# Patient Record
Sex: Female | Born: 1955 | Race: White | Hispanic: No | Smoking: Former smoker
Health system: Southern US, Community
[De-identification: ages and names within clinical notes are randomized; demographics above are authoritative.]

## PROBLEM LIST (undated history)

## (undated) DIAGNOSIS — F32A Depression, unspecified: Secondary | ICD-10-CM

## (undated) DIAGNOSIS — I639 Cerebral infarction, unspecified: Secondary | ICD-10-CM

## (undated) DIAGNOSIS — Z720 Tobacco use: Secondary | ICD-10-CM

## (undated) DIAGNOSIS — G2581 Restless legs syndrome: Secondary | ICD-10-CM

## (undated) DIAGNOSIS — C801 Malignant (primary) neoplasm, unspecified: Secondary | ICD-10-CM

## (undated) DIAGNOSIS — N6009 Solitary cyst of unspecified breast: Secondary | ICD-10-CM

## (undated) DIAGNOSIS — R569 Unspecified convulsions: Secondary | ICD-10-CM

## (undated) DIAGNOSIS — I209 Angina pectoris, unspecified: Secondary | ICD-10-CM

## (undated) DIAGNOSIS — I10 Essential (primary) hypertension: Secondary | ICD-10-CM

## (undated) DIAGNOSIS — G459 Transient cerebral ischemic attack, unspecified: Secondary | ICD-10-CM

## (undated) DIAGNOSIS — F329 Major depressive disorder, single episode, unspecified: Secondary | ICD-10-CM

## (undated) HISTORY — DX: Solitary cyst of unspecified breast: N60.09

## (undated) HISTORY — DX: Malignant (primary) neoplasm, unspecified: C80.1

## (undated) HISTORY — PX: KNEE ARTHROSCOPY: SUR90

## (undated) HISTORY — PX: MASTECTOMY: SHX3

## (undated) HISTORY — PX: CARPAL TUNNEL RELEASE: SHX101

## (undated) HISTORY — PX: BREAST RECONSTRUCTION: SHX9

---

## 1998-03-12 ENCOUNTER — Other Ambulatory Visit: Admission: RE | Admit: 1998-03-12 | Discharge: 1998-03-12 | Payer: Self-pay | Admitting: Obstetrics and Gynecology

## 1998-03-14 ENCOUNTER — Ambulatory Visit (HOSPITAL_COMMUNITY): Admission: RE | Admit: 1998-03-14 | Discharge: 1998-03-14 | Payer: Self-pay | Admitting: Obstetrics and Gynecology

## 1999-08-07 ENCOUNTER — Other Ambulatory Visit: Admission: RE | Admit: 1999-08-07 | Discharge: 1999-08-07 | Payer: Self-pay | Admitting: Obstetrics and Gynecology

## 2000-08-02 ENCOUNTER — Other Ambulatory Visit: Admission: RE | Admit: 2000-08-02 | Discharge: 2000-08-02 | Payer: Self-pay | Admitting: Obstetrics and Gynecology

## 2001-05-22 ENCOUNTER — Encounter: Admission: RE | Admit: 2001-05-22 | Discharge: 2001-05-22 | Payer: Self-pay | Admitting: Internal Medicine

## 2001-05-22 ENCOUNTER — Encounter: Payer: Self-pay | Admitting: Internal Medicine

## 2001-07-14 ENCOUNTER — Encounter
Admission: RE | Admit: 2001-07-14 | Discharge: 2001-07-14 | Payer: Self-pay | Admitting: Physical Medicine and Rehabilitation

## 2001-07-14 ENCOUNTER — Encounter: Payer: Self-pay | Admitting: Physical Medicine and Rehabilitation

## 2001-08-25 ENCOUNTER — Encounter: Payer: Self-pay | Admitting: Neurology

## 2001-08-25 ENCOUNTER — Encounter: Admission: RE | Admit: 2001-08-25 | Discharge: 2001-08-25 | Payer: Self-pay | Admitting: Neurology

## 2003-03-22 ENCOUNTER — Encounter: Admission: RE | Admit: 2003-03-22 | Discharge: 2003-03-22 | Payer: Self-pay | Admitting: Family Medicine

## 2003-08-09 ENCOUNTER — Encounter: Admission: RE | Admit: 2003-08-09 | Discharge: 2003-08-09 | Payer: Self-pay | Admitting: Family Medicine

## 2004-08-17 ENCOUNTER — Ambulatory Visit: Payer: Self-pay | Admitting: Internal Medicine

## 2004-09-03 ENCOUNTER — Ambulatory Visit: Payer: Self-pay | Admitting: Internal Medicine

## 2004-09-07 ENCOUNTER — Ambulatory Visit: Payer: Self-pay | Admitting: *Deleted

## 2004-09-17 ENCOUNTER — Ambulatory Visit: Payer: Self-pay | Admitting: Internal Medicine

## 2004-09-22 ENCOUNTER — Ambulatory Visit: Payer: Self-pay | Admitting: Internal Medicine

## 2004-09-29 ENCOUNTER — Ambulatory Visit (HOSPITAL_COMMUNITY): Admission: RE | Admit: 2004-09-29 | Discharge: 2004-09-29 | Payer: Self-pay | Admitting: Internal Medicine

## 2004-10-01 ENCOUNTER — Ambulatory Visit: Payer: Self-pay | Admitting: Nurse Practitioner

## 2004-10-21 ENCOUNTER — Ambulatory Visit (HOSPITAL_COMMUNITY): Admission: RE | Admit: 2004-10-21 | Discharge: 2004-10-21 | Payer: Self-pay | Admitting: Internal Medicine

## 2004-10-21 ENCOUNTER — Ambulatory Visit: Payer: Self-pay | Admitting: Family Medicine

## 2004-10-27 ENCOUNTER — Ambulatory Visit: Payer: Self-pay | Admitting: Internal Medicine

## 2004-12-16 ENCOUNTER — Ambulatory Visit: Payer: Self-pay | Admitting: Internal Medicine

## 2005-10-03 ENCOUNTER — Emergency Department (HOSPITAL_COMMUNITY): Admission: EM | Admit: 2005-10-03 | Discharge: 2005-10-03 | Payer: Self-pay | Admitting: Emergency Medicine

## 2008-04-17 ENCOUNTER — Encounter: Admission: RE | Admit: 2008-04-17 | Discharge: 2008-04-17 | Payer: Self-pay | Admitting: Family Medicine

## 2009-11-14 ENCOUNTER — Encounter: Admission: RE | Admit: 2009-11-14 | Discharge: 2009-11-14 | Payer: Self-pay | Admitting: Family Medicine

## 2010-02-22 ENCOUNTER — Encounter: Payer: Self-pay | Admitting: Internal Medicine

## 2010-08-02 DIAGNOSIS — G459 Transient cerebral ischemic attack, unspecified: Secondary | ICD-10-CM

## 2010-08-02 HISTORY — DX: Transient cerebral ischemic attack, unspecified: G45.9

## 2010-08-07 ENCOUNTER — Inpatient Hospital Stay (HOSPITAL_COMMUNITY)
Admission: EM | Admit: 2010-08-07 | Discharge: 2010-08-09 | DRG: 313 | Disposition: A | Discharge status: Home / Self Care | Payer: Managed Care, Other (non HMO) | Attending: Internal Medicine | Admitting: Internal Medicine

## 2010-08-07 ENCOUNTER — Encounter: Payer: Self-pay | Admitting: Internal Medicine

## 2010-08-07 ENCOUNTER — Emergency Department (HOSPITAL_COMMUNITY): Payer: Managed Care, Other (non HMO)

## 2010-08-07 ENCOUNTER — Inpatient Hospital Stay (HOSPITAL_COMMUNITY): Payer: Managed Care, Other (non HMO)

## 2010-08-07 DIAGNOSIS — F329 Major depressive disorder, single episode, unspecified: Secondary | ICD-10-CM | POA: Diagnosis present

## 2010-08-07 DIAGNOSIS — R51 Headache: Secondary | ICD-10-CM | POA: Diagnosis present

## 2010-08-07 DIAGNOSIS — R4789 Other speech disturbances: Secondary | ICD-10-CM | POA: Diagnosis present

## 2010-08-07 DIAGNOSIS — I498 Other specified cardiac arrhythmias: Secondary | ICD-10-CM | POA: Diagnosis present

## 2010-08-07 DIAGNOSIS — I209 Angina pectoris, unspecified: Secondary | ICD-10-CM | POA: Diagnosis present

## 2010-08-07 DIAGNOSIS — R29810 Facial weakness: Secondary | ICD-10-CM | POA: Diagnosis present

## 2010-08-07 DIAGNOSIS — F3289 Other specified depressive episodes: Secondary | ICD-10-CM | POA: Diagnosis present

## 2010-08-07 DIAGNOSIS — F17200 Nicotine dependence, unspecified, uncomplicated: Secondary | ICD-10-CM | POA: Diagnosis present

## 2010-08-07 DIAGNOSIS — R29898 Other symptoms and signs involving the musculoskeletal system: Secondary | ICD-10-CM | POA: Diagnosis present

## 2010-08-07 DIAGNOSIS — R072 Precordial pain: Principal | ICD-10-CM | POA: Diagnosis present

## 2010-08-07 DIAGNOSIS — G459 Transient cerebral ischemic attack, unspecified: Secondary | ICD-10-CM | POA: Diagnosis present

## 2010-08-07 DIAGNOSIS — R079 Chest pain, unspecified: Secondary | ICD-10-CM

## 2010-08-07 DIAGNOSIS — I10 Essential (primary) hypertension: Secondary | ICD-10-CM | POA: Diagnosis present

## 2010-08-07 LAB — BASIC METABOLIC PANEL
CO2: 26 mEq/L (ref 19–32)
Creatinine, Ser: 0.63 mg/dL (ref 0.50–1.10)
GFR calc Af Amer: >60 mL/min (ref >60)
Glucose, Bld: 96 mg/dL (ref 70–99)
Potassium: 4.4 mEq/L (ref 3.5–5.1)
Sodium: 137 mEq/L (ref 135–145)

## 2010-08-07 LAB — CBC
Hemoglobin: 12.3 g/dL (ref 12.0–15.0)
MCH: 35 pg — ABNORMAL HIGH (ref 26.0–34.0)
MCHC: 35 g/dL (ref 30.0–36.0)
MCV: 100 fL (ref 78.0–100.0)
Platelets: 211 10*3/uL (ref 150–400)
RBC: 3.51 MIL/uL — ABNORMAL LOW (ref 3.87–5.11)

## 2010-08-07 LAB — CK TOTAL AND CKMB (NOT AT ARMC)
Relative Index: INVALID (ref 0.0–2.5)
Total CK: 93 U/L (ref 7–177)

## 2010-08-07 LAB — CARDIAC PANEL(CRET KIN+CKTOT+MB+TROPI)
CK, MB: 2.1 ng/mL (ref 0.3–4.0)
Troponin I: <0.3 ng/mL (ref <0.30)

## 2010-08-07 LAB — DIFFERENTIAL
Basophils Absolute: 0 10*3/uL (ref 0.0–0.1)
Eosinophils Relative: 2 % (ref 0–5)
Lymphocytes Relative: 36 % (ref 12–46)

## 2010-08-07 NOTE — H&P (Signed)
Hospital Admission Note Date: 08/07/2010  Patient name: Anna Lyons Medical record number: [id number] Date of birth: September 11, 1955 Age: 55 y.o. Gender: female PCP: No primary provider on file.  Medical Service:  Attending physician:    Dr. Josem Kaufmann Resident [phone]):  Dr. Baltazar Apo    Pager: [phone] Resident (R1):  Dr. Milbert Coulter    Pager: [phone]  Chief Complaint:Chest pressure  History of Present Illness: Anna Lyons is a 55 year old woman who presents to the ED for chest pain. She provides most of the history and is accompanied by her husband and 2 friends. She reports the pain began this morning when she was moving boxes. Once the pain began, she sat down but the pain did not improve. Her husband then called EMS. . She reports her husband gave her aspirin when the pain began which did not alleviate her symptoms. She also says she received one dose of Nitroglycerin by EMS which fully relieved her pain.  She describes the pain as a burning pressure sensation. She is unable to recall if the pain radiated anywhere. In addition to her chest pain, she also experienced tingling and numbness in her right and left arm. She denies any loss of sensation or weakness. She also reports diaphoresis and nausea during the chest pain. She denies cough, vomiting, palpitations, shortness of breath, or edema. She denies pain with movement of her extremities or tenderness to palpation of the chest. She states that she has never experienced this type of pain before, but she did have one episode of angina which was diagnosed and treated by her PCP.  She is seeking further evaluation of her chest pain at the ED.   Meds: Sertraline 50 mg po qday  Allergies: Codeine- rash .  PMH: 1. Depression 2. Tension headaches   Surgical History: 1. Cesarean section  in 1975 2. Bilateral mastectomy in 1992  Family History: Mother: lupus Father: emphysema Siblings: 3 brothers and 1 sister with no known medical problems  Children:  1 daughter with no known medical problems     History   Social History  . Marital Status: Single    Spouse Name: N/A    Number of Children: N/A  . Years of Education: education: graduated high school    Occupational History  . -work: Conservation officer, nature at [employer]    Social History Main Topics  . Smoking status:  pack per day X 40 years; not interested in quitting   . Smokeless tobacco: Not on file  . Alcohol Use: Alcohol: 2 glasses of wine daily   . Drug Use: Not on file  . Sexually Active: Not on file    Review of Systems: Pertinent items are noted in HPI. General: diaphoretic while having chest pain; no fever, chills, weight loss, weight gain, fatigue, night sweats, pallor HEENT: headache after taking nitroglycerin; no vision changes, blurriness, dizziness, or tinnitus Cardiac: no dyspnea on exertion, orthopnea, palpitations, syncope, PND  Respiratory: when she lays down, occasional wheezing GI: nausea; heartburn only when eating spicy foods; no vomiting, changes in appetite, dysphagia, regular BMs; no hematochezia, hematemesis Extremities: no claudication, edema Urinary: no dysuria, frequency, urgency  MSK: no recent trauma, joint pain, joint swelling, muscle pain Neuro: no weakness, numbness, tremor Psych: depression; no anxiety   Vitals:  HR 70 SpO2 98 on RA  RR: 15 BP: 159/92 T: 97.8   Physical Exam:  General: 55 year old woman who is alert, cooperative, and not in acute distress Head: no trauma  Neck: no JVD, masses  Breasts: bilateral double mastectomy  CV: normal rate and rhythm. S1 and S2 normal. no murmurs, gallops or rubs  Resp: clear to auscultation bilaterally; no wheezes, crackles.  GI: soft, non-tender. Normal bowel sounds. No organomegaly or masses. Midline scar from cesarean section.   MSK: full range of motion. No weakness. Chest non-tender to palpation. Extremities: atraumatic, no cyanosis, no edema.   Pulses: 2+ and symmetric.  Neurologic: alert  and oriented X3, normal strength and tone.   Lab results:   WBC                                      5.1               4.0-10.5         K/uL  RBC                                      3.51       l      3.87-5.11        MIL/uL  Hemoglobin (HGB)                         12.3              12.0-15.0        g/dL  Hematocrit (HCT)                         35.1       l      36.0-46.0        %  MCV                                      100.0             78.0-100.0       fL  MCH -                                    35.0       h      26.0-34.0        pg  MCHC                                     35.0              30.0-36.0        g/dL  RDW                                      13.3              11.5-15.5        %  Platelet Count (PLT)                     211               150-400  K/uL  Neutrophils, %                           56                43-77            %  Lymphocytes, %                           36                12-46            %  Monocytes, %                             6                 3-12             %  Eosinophils, %                           2                 0-5              %  Basophils, %                             0                 0-1              %  Neutrophils, Absolute                    2.9               1.7-7.7          K/uL  Lymphocytes, Absolute                    1.8               0.7-4.0          K/uL  Monocytes, Absolute                      0.3               0.1-1.0          K/uL  Eosinophils, Absolute                    0.1               0.0-0.7          K/uL  Basophils, Absolute                      0.0               0.0-0.1          K/uL  Troponin I                               <0.30             <0.30  Ng/mL  Sodium (NA)                              137               135-145          mEq/L  Potassium (K)                            4.4               3.5-5.1          mEq/L  Chloride                                 101               96-112            mEq/L  CO2                                      26                19-32            mEq/L  Glucose                                  96                70-99            mg/dL  BUN                                      11                6-23             mg/dL  Creatinine                               0.63              0.50-1.10        mg/dL    **Please note change in reference range.**  GFR, Est Non African American            >60               >60              mL/min  GFR, Est African American                >60               >60              mL/min    Oversized comment, see footnote  1  Calcium                                  9.1               8.4-10.5  Mg/dL   Creatine Kinase, Total                   93                7-177            U/L  CK, MB                                   2.2               0.3-4.0          ng/mL  Relative Index                           SEE NOTE.         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  Otherwise   unchanged cardiopulmonary appearance with no acute or worrisome   focal abnormality identified.   Assessment & Plan by Problem:  Anna Lyons is 55 year old woman who presents to the ED for chest pain.  1. Chest pain- The most likely diagnosis is chest pain is unstable angina. This is most consistent with her acute presentation as well as her substernal chest pain, diaphoresis, and nausea. Furthermore, her symptoms were triggered by increased exertion and were relived with nitroglycerin. Also, she reports she experienced an episode of angina within the last year. Other diagnoses to consider include MI, GERD, aortic dissection, PE, and anxiety. The patient does not report any substernal pain radiating upwards or pain elicited by lying down to suggest GERD. The patient also denies anxiety. Based on the description of her pain, aortic dissection is less  likely. MI is also less likely based on the intensity and duration of the patient's symptoms.  Plan: -Cardiac enzymes x3  -chest X-ray  -12 lead EKG now and in the morning  -give Acetominophen 650mg  prn for pain   -morphine 2mg  IV every 4hours prn for pain   2. Depression- stable; Sertraline 50mg  po daily   3. Prophylaxis: DVT prophylaxis with Lovenox

## 2010-08-07 NOTE — H&P (Signed)
Hospital Admission Note Date: 08/07/2010  Patient name: Anna Lyons Medical record number: [id number] Date of birth: 1955/09/24 Age: 55 y.o. Gender: female PCP: No primary provider on file.  Medical Service:  Attending physician:    Dr. Josem Kaufmann Resident [phone]):  Dr. Baltazar Apo    Pager: [phone] Resident (R1):  Dr. Milbert Coulter    Pager: [phone]  Chief Complaint:Chest pressure  History of Present Illness: Anna Lyons is a 55 year old woman who presents to the ED for chest pain. She provides most of the history and is accompanied by her husband and 2 friends. She reports the pain began this morning when she was moving boxes. Once the pain began, she sat down but the pain did not improve. Her husband then called EMS. . She reports her husband gave her aspirin when the pain began which did not alleviate her symptoms. She also says she received one dose of Nitroglycerin by EMS which fully relieved her pain.  She describes the pain as a burning pressure sensation. She is unable to recall if the pain radiated anywhere. In addition to her chest pain, she also experienced tingling and numbness in her right and left arm. She denies any loss of sensation or weakness. She also reports diaphoresis and nausea during the chest pain. She denies cough, vomiting, palpitations, shortness of breath, or edema. She denies pain with movement of her extremities or tenderness to palpation of the chest. She states that she has never experienced this type of pain before, but she did have one episode of angina which was diagnosed and treated by her PCP.  She is seeking further evaluation of her chest pain at the ED.   Meds: Sertraline 50 mg po qday  Allergies: Codeine- rash .  PMH: 1. Depression 2. Tension headaches   Surgical History: 1. Cesarean section  in 1975 2. Bilateral mastectomy in 1992  Family History: Mother: lupus Father: emphysema Siblings: 3 brothers and 1 sister with no known medical problems  Children:  1 daughter with no known medical problems     History   Social History  . Marital Status: Single    Spouse Name: N/A    Number of Children: N/A  . Years of Education: education: graduated high school    Occupational History  . -work: Conservation officer, nature at [employer]    Social History Main Topics  . Smoking status:  pack per day X 40 years; not interested in quitting   . Smokeless tobacco: Not on file  . Alcohol Use: Alcohol: 2 glasses of wine daily   . Drug Use: Not on file  . Sexually Active: Not on file    Review of Systems: Pertinent items are noted in HPI. General: diaphoretic while having chest pain; no fever, chills, weight loss, weight gain, fatigue, night sweats, pallor HEENT: headache after taking nitroglycerin; no vision changes, blurriness, dizziness, or tinnitus Cardiac: no dyspnea on exertion, orthopnea, palpitations, syncope, PND  Respiratory: when she lays down, occasional wheezing GI: nausea; heartburn only when eating spicy foods; no vomiting, changes in appetite, dysphagia, regular BMs; no hematochezia, hematemesis Extremities: no claudication, edema Urinary: no dysuria, frequency, urgency  MSK: no recent trauma, joint pain, joint swelling, muscle pain Neuro: no weakness, numbness, tremor Psych: depression; no anxiety   Vitals:  HR 70 SpO2 98 on RA  RR: 15 BP: 159/92 T: 97.8   Physical Exam:  General: 56 year old woman who is alert, cooperative, and not in acute distress Head: no trauma  Neck: no JVD, masses  Breasts: bilateral double mastectomy  CV: normal rate and rhythm. S1 and S2 normal. no murmurs, gallops or rubs  Resp: clear to auscultation bilaterally; no wheezes, crackles.  GI: soft, non-tender. Normal bowel sounds. No organomegaly or masses. Midline scar from cesarean section.   MSK: full range of motion. No weakness. Chest non-tender to palpation. Extremities: atraumatic, no cyanosis, no edema.   Pulses: 2+ and symmetric.  Neurologic: alert  and oriented X3, normal strength and tone.   Lab results:   WBC                                      5.1               4.0-10.5         K/uL  RBC                                      3.51       l      3.87-5.11        MIL/uL  Hemoglobin (HGB)                         12.3              12.0-15.0        g/dL  Hematocrit (HCT)                         35.1       l      36.0-46.0        %  MCV                                      100.0             78.0-100.0       fL  MCH -                                    35.0       h      26.0-34.0        pg  MCHC                                     35.0              30.0-36.0        g/dL  RDW                                      13.3              11.5-15.5        %  Platelet Count (PLT)                     211               150-400  K/uL  Neutrophils, %                           56                43-77            %  Lymphocytes, %                           36                12-46            %  Monocytes, %                             6                 3-12             %  Eosinophils, %                           2                 0-5              %  Basophils, %                             0                 0-1              %  Neutrophils, Absolute                    2.9               1.7-7.7          K/uL  Lymphocytes, Absolute                    1.8               0.7-4.0          K/uL  Monocytes, Absolute                      0.3               0.1-1.0          K/uL  Eosinophils, Absolute                    0.1               0.0-0.7          K/uL  Basophils, Absolute                      0.0               0.0-0.1          K/uL  Troponin I                               <0.30             <0.30  Ng/mL   Sodium (NA)                              137               135-145          mEq/L  Potassium (K)                            4.4               3.5-5.1          mEq/L  Chloride                                 101               96-112            mEq/L  CO2                                      26                19-32            mEq/L  Glucose                                  96                70-99            mg/dL  BUN                                      11                6-23             mg/dL  Creatinine                               0.63              0.50-1.10        mg/dL    **Please note change in reference range.**  GFR, Est Non African American            >60               >60              mL/min  GFR, Est African American                >60               >60              mL/min    Oversized comment, see footnote  1  Calcium                                  9.1               8.4-10.5  Mg/dL   Creatine Kinase, Total                   93                7-177            U/L  CK, MB                                   2.2               0.3-4.0          ng/mL  Relative Index                           SEE NOTE.         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  Otherwise   unchanged cardiopulmonary appearance with no acute or worrisome   focal abnormality identified.   Assessment & Plan by Problem:  Ranelle Auker is 55 year old woman who presents to the ED for chest pain.  1. Chest pain- The most likely diagnosis is chest pain is unstable angina. This is most consistent with her acute presentation as well as her substernal chest pain, diaphoresis, and nausea. Furthermore, her symptoms were triggered by increased exertion and were relived with nitroglycerin. Also, she reports she experienced an episode of angina within the last year. Other diagnoses to consider include MI, GERD, aortic dissection, PE, and anxiety. The patient does not report any substernal pain radiating upwards or pain elicited by lying down to suggest GERD. The patient also denies anxiety. Based on the description of her pain, aortic dissection is less  likely. MI is also less likely based on the intensity and duration of the patient's symptoms.  Plan: -Cardiac enzymes x3  -Chest X-ray  -12 lead EKG now and in the morning  -give Acetominophen 650mg  prn for pain   -morphine 2mg  IV every 4hours prn for pain  -NTG as needed for chest pain  2. Depression- stable; Sertraline 50mg  po daily   3. Prophylaxis: DVT prophylaxis with Lovenox     [name] Jefferson PGY-1 [phone]    Melida Quitter PGY-3  [phone]

## 2010-08-08 ENCOUNTER — Inpatient Hospital Stay (HOSPITAL_COMMUNITY): Payer: Managed Care, Other (non HMO)

## 2010-08-08 LAB — LIPID PANEL: HDL: 36 mg/dL — ABNORMAL LOW (ref >39)

## 2010-08-08 LAB — PROTIME-INR: INR: 0.92 (ref 0.00–1.49)

## 2010-08-08 LAB — CARDIAC PANEL(CRET KIN+CKTOT+MB+TROPI): Relative Index: INVALID (ref 0.0–2.5)
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
## 2010-09-22 ENCOUNTER — Other Ambulatory Visit: Payer: Self-pay | Admitting: Family Medicine

## 2010-09-22 DIAGNOSIS — G459 Transient cerebral ischemic attack, unspecified: Secondary | ICD-10-CM

## 2010-09-25 ENCOUNTER — Ambulatory Visit
Admission: RE | Admit: 2010-09-25 | Discharge: 2010-09-25 | Disposition: A | Discharge status: Home / Self Care | Payer: Managed Care, Other (non HMO) | Source: Ambulatory Visit | Attending: Family Medicine | Admitting: Family Medicine

## 2010-09-25 DIAGNOSIS — G459 Transient cerebral ischemic attack, unspecified: Secondary | ICD-10-CM

## 2011-04-20 ENCOUNTER — Encounter (HOSPITAL_COMMUNITY): Payer: Self-pay | Admitting: Emergency Medicine

## 2011-04-20 ENCOUNTER — Inpatient Hospital Stay (HOSPITAL_COMMUNITY)
Admission: EM | Admit: 2011-04-20 | Discharge: 2011-04-23 | DRG: 062 | Disposition: A | Discharge status: Rehabilitation Facility | Payer: MEDICAID | Attending: Neurology | Admitting: Neurology

## 2011-04-20 ENCOUNTER — Emergency Department (HOSPITAL_COMMUNITY): Payer: Self-pay

## 2011-04-20 ENCOUNTER — Other Ambulatory Visit: Payer: Self-pay

## 2011-04-20 DIAGNOSIS — R42 Dizziness and giddiness: Secondary | ICD-10-CM | POA: Diagnosis not present

## 2011-04-20 DIAGNOSIS — K5900 Constipation, unspecified: Secondary | ICD-10-CM | POA: Diagnosis not present

## 2011-04-20 DIAGNOSIS — F331 Major depressive disorder, recurrent, moderate: Secondary | ICD-10-CM | POA: Insufficient documentation

## 2011-04-20 DIAGNOSIS — I6350 Cerebral infarction due to unspecified occlusion or stenosis of unspecified cerebral artery: Principal | ICD-10-CM | POA: Diagnosis present

## 2011-04-20 DIAGNOSIS — Z8673 Personal history of transient ischemic attack (TIA), and cerebral infarction without residual deficits: Secondary | ICD-10-CM

## 2011-04-20 DIAGNOSIS — I10 Essential (primary) hypertension: Secondary | ICD-10-CM | POA: Diagnosis present

## 2011-04-20 DIAGNOSIS — G8190 Hemiplegia, unspecified affecting unspecified side: Secondary | ICD-10-CM | POA: Diagnosis present

## 2011-04-20 DIAGNOSIS — R51 Headache: Secondary | ICD-10-CM | POA: Diagnosis present

## 2011-04-20 DIAGNOSIS — R4701 Aphasia: Secondary | ICD-10-CM | POA: Diagnosis present

## 2011-04-20 DIAGNOSIS — F17200 Nicotine dependence, unspecified, uncomplicated: Secondary | ICD-10-CM | POA: Diagnosis present

## 2011-04-20 DIAGNOSIS — F339 Major depressive disorder, recurrent, unspecified: Secondary | ICD-10-CM | POA: Insufficient documentation

## 2011-04-20 DIAGNOSIS — I639 Cerebral infarction, unspecified: Secondary | ICD-10-CM

## 2011-04-20 DIAGNOSIS — F3289 Other specified depressive episodes: Secondary | ICD-10-CM | POA: Diagnosis present

## 2011-04-20 DIAGNOSIS — F329 Major depressive disorder, single episode, unspecified: Secondary | ICD-10-CM | POA: Diagnosis present

## 2011-04-20 HISTORY — DX: Depression, unspecified: F32.A

## 2011-04-20 HISTORY — DX: Cerebral infarction, unspecified: I63.9

## 2011-04-20 HISTORY — DX: Major depressive disorder, single episode, unspecified: F32.9

## 2011-04-20 HISTORY — DX: Essential (primary) hypertension: I10

## 2011-04-20 LAB — COMPREHENSIVE METABOLIC PANEL
Albumin: 3.6 g/dL (ref 3.5–5.2)
BUN: 12 mg/dL (ref 6–23)
Calcium: 9.4 mg/dL (ref 8.4–10.5)
Creatinine, Ser: 0.66 mg/dL (ref 0.50–1.10)
Total Protein: 7.1 g/dL (ref 6.0–8.3)

## 2011-04-20 LAB — CK TOTAL AND CKMB (NOT AT ARMC)
CK, MB: 1.7 ng/mL (ref 0.3–4.0)
Total CK: 65 U/L (ref 7–177)

## 2011-04-20 LAB — DIFFERENTIAL
Basophils Relative: 1 % (ref 0–1)
Eosinophils Absolute: 0.1 10*3/uL (ref 0.0–0.7)
Eosinophils Relative: 1 % (ref 0–5)
Monocytes Absolute: 0.5 10*3/uL (ref 0.1–1.0)
Monocytes Relative: 9 % (ref 3–12)
Neutrophils Relative %: 61 % (ref 43–77)

## 2011-04-20 LAB — POCT I-STAT, CHEM 8
Calcium, Ion: 1.16 mmol/L (ref 1.12–1.32)
Hemoglobin: 14.3 g/dL (ref 12.0–15.0)
Sodium: 138 mEq/L (ref 135–145)
TCO2: 27 mmol/L (ref 0–100)

## 2011-04-20 LAB — CBC
HCT: 38.5 % (ref 36.0–46.0)
Hemoglobin: 13.8 g/dL (ref 12.0–15.0)
MCH: 35.3 pg — ABNORMAL HIGH (ref 26.0–34.0)
MCHC: 35.8 g/dL (ref 30.0–36.0)

## 2011-04-20 LAB — PROTIME-INR: INR: 1.04 (ref 0.00–1.49)

## 2011-04-20 LAB — TROPONIN I: Troponin I: <0.3 ng/mL (ref <0.30)

## 2011-04-20 MED ORDER — ACETAMINOPHEN 325 MG PO TABS
650.0000 mg | ORAL_TABLET | ORAL | Status: DC | PRN
  Administered 2011-04-21 (×2): 650 mg via ORAL
  Filled 2011-04-20 (×2): qty 2

## 2011-04-20 MED ORDER — ROPINIROLE HCL 0.5 MG PO TABS
0.5000 mg | ORAL_TABLET | Freq: Once | ORAL | Status: AC
  Administered 2011-04-21: 0.5 mg via ORAL
  Filled 2011-04-20: qty 1

## 2011-04-20 MED ORDER — SODIUM CHLORIDE 0.9 % IV SOLN
250.0000 mL | Freq: Once | INTRAVENOUS | Status: AC
  Administered 2011-04-20: 250 mL via INTRAVENOUS

## 2011-04-20 MED ORDER — ACETAMINOPHEN 650 MG RE SUPP: 650.0000 mg | RECTAL | Status: DC | PRN

## 2011-04-20 MED ORDER — PANTOPRAZOLE SODIUM 40 MG IV SOLR
40.0000 mg | Freq: Every day | INTRAVENOUS | Status: DC
  Administered 2011-04-20 – 2011-04-22 (×3): 40 mg via INTRAVENOUS
  Filled 2011-04-20 (×5): qty 40

## 2011-04-20 MED ORDER — SENNOSIDES-DOCUSATE SODIUM 8.6-50 MG PO TABS
1.0000 | ORAL_TABLET | Freq: Every evening | ORAL | Status: DC | PRN
  Filled 2011-04-20: qty 1

## 2011-04-20 MED ORDER — SODIUM CHLORIDE 0.9 % IV SOLN
INTRAVENOUS | Status: AC
  Administered 2011-04-21: 01:00:00 via INTRAVENOUS

## 2011-04-20 MED ORDER — ALTEPLASE (STROKE) FULL DOSE INFUSION: 0.9000 mg/kg | Freq: Once | INTRAVENOUS | Status: DC

## 2011-04-20 MED ORDER — IOHEXOL 300 MG/ML  SOLN
150.0000 mL | Freq: Once | INTRAMUSCULAR | Status: AC | PRN
  Administered 2011-04-20: 50 mL via INTRA_ARTERIAL

## 2011-04-20 MED ORDER — SODIUM CHLORIDE 0.9 % IV SOLN
INTRAVENOUS | Status: DC
  Administered 2011-04-21 (×2): 75 mL/h via INTRAVENOUS
  Administered 2011-04-22: 06:00:00 via INTRAVENOUS

## 2011-04-20 MED ORDER — ALTEPLASE (STROKE) FULL DOSE INFUSION
0.9000 mg/kg | Freq: Once | INTRAVENOUS | Status: AC
  Administered 2011-04-20: 64 mg via INTRAVENOUS
  Filled 2011-04-20: qty 100

## 2011-04-20 MED ORDER — LABETALOL HCL 5 MG/ML IV SOLN: 10.0000 mg | INTRAVENOUS | Status: DC | PRN

## 2011-04-20 MED ORDER — ONDANSETRON HCL 4 MG/2ML IJ SOLN: 4.0000 mg | Freq: Four times a day (QID) | INTRAMUSCULAR | Status: DC | PRN

## 2011-04-20 NOTE — ED Notes (Signed)
Pt returned to room from intervention.  Pt awaiting Room assignment at this time.

## 2011-04-20 NOTE — ED Provider Notes (Signed)
History     CSN: [id number]  Arrival date & time 04/20/11  1406   First MD Initiated Contact with Patient 04/20/11 1407      No chief complaint on file.   (Consider location/radiation/quality/duration/timing/severity/associated sxs/prior treatment) Patient is a 56 y.o. female presenting with neurologic complaint. The history is provided by the patient and the EMS personnel.  Neurologic Problem The primary symptoms include focal weakness. Primary symptoms do not include headaches, loss of consciousness, fever, nausea or vomiting. The symptoms began 1 to 2 hours ago. The symptoms are unchanged. The neurological symptoms are focal.  There is weakness in these regions/motions: facial muscles (right arm and right leg weakness).  Additional symptoms include weakness.    No past medical history on file.  No past surgical history on file.  No family history on file.  History  Substance Use Topics  . Smoking status: Not on file  . Smokeless tobacco: Not on file  . Alcohol Use: Not on file    OB History    No data available      Review of Systems  Constitutional: Negative for fever and chills.  HENT: Negative for congestion and rhinorrhea.   Respiratory: Negative for shortness of breath.   Cardiovascular: Negative for chest pain and leg swelling.  Gastrointestinal: Negative for nausea, vomiting, abdominal pain and constipation.  Genitourinary: Negative for urgency, decreased urine volume and difficulty urinating.  Skin: Negative for wound.  Neurological: Positive for focal weakness, speech difficulty and weakness. Negative for loss of consciousness and headaches.  Psychiatric/Behavioral: Negative for confusion.  All other systems reviewed and are negative.    Allergies  Review of patient's allergies indicates not on file.  Home Medications  No current outpatient prescriptions on file.  There were no vitals taken for this visit.  Physical Exam  Nursing note and  vitals reviewed. Constitutional: She is oriented to person, place, and time. She appears well-developed and well-nourished. No distress.  HENT:  Head: Normocephalic and atraumatic.  Right Ear: External ear normal.  Left Ear: External ear normal.  Nose: Nose normal.  Mouth/Throat: Oropharynx is clear and moist.  Neck: Neck supple.  Cardiovascular: Normal rate, regular rhythm, normal heart sounds and intact distal pulses.   Pulmonary/Chest: Effort normal and breath sounds normal. No respiratory distress. She has no wheezes. She has no rales.  Abdominal: Soft. She exhibits no distension. There is no tenderness.  Musculoskeletal: She exhibits no edema.  Lymphadenopathy:    She has no cervical adenopathy.  Neurological: She is alert and oriented to person, place, and time. No sensory deficit. She exhibits normal muscle tone. GCS eye subscore is 4. GCS verbal subscore is 5. GCS motor subscore is 6.       Pronounced weakness of right upper and lower extremities, including grip strength, biceps and triceps, and quadriceps and lower leg movement. Mild slurring of speech  Skin: Skin is warm and dry. She is not diaphoretic. No pallor.    ED Course  Procedures (including critical care time)  Labs Reviewed  CBC - Abnormal; Notable for the following:    MCH 35.3 (*)    All other components within normal limits  PROTIME-INR  APTT  DIFFERENTIAL  COMPREHENSIVE METABOLIC PANEL  CK TOTAL AND CKMB  TROPONIN I  POCT I-STAT, CHEM 8   Ct Head Wo Contrast  04/20/2011  *RADIOLOGY REPORT*  Clinical Data: Right-sided weakness.  Evaluate for stroke.  CT HEAD WITHOUT CONTRAST  Technique:  Contiguous axial images were obtained  from the base of the skull through the vertex without contrast.  Comparison: Head CT 08/08/2010  Findings: Hypodensity in the right putamen is unchanged from prior CT and may reflect a remote lacunar infarction.  Minimal subcortical white matter hypoattenuation bilaterally is stable.  There is no hemorrhage.  No evidence of acute cortically based infarction.  The ventricles are normal and stable in size.  The visualized paranasal sinuses and mastoid air cells are clear.  The skull is intact.  IMPRESSION: 1.  Stable head CT.  No acute intracranial abnormality identified. 2.  Stable mild subcortical white matter hypoattenuation, likely reflecting chronic microvascular ischemia. 3.  Stable, remote right putamen lacunar infarct.  Original Report Authenticated By: Britta Mccreedy, M.D.     1. Stroke       MDM  56 yo female with acute onset of stroke symptoms (aphasia, right sided leg and arm weakness) approx 1.3-2 hours prior to arrival to ED. Quick CT scan shows no bleed. Neurology at bedside upon patient arrival. Patient not on blood thinners, has not had recent surgery and no acute bleeding or ever having hx of intracranial bleeding. TPA given by Neurologist, who will also take to interventional as well.        Pricilla Loveless, MD 04/20/11 1500

## 2011-04-20 NOTE — ED Notes (Signed)
Attempted to give report.  RN request 5 minute wait for RN on next shift

## 2011-04-20 NOTE — ED Notes (Addendum)
Per EMS: pt from home c/o right sided weakness starting at 1200 with right arm and leg weakness; pt with HA and dizziness also ; pt sts was getting ready for work when started

## 2011-04-20 NOTE — ED Notes (Signed)
3110-01 Ready 

## 2011-04-20 NOTE — H&P (Addendum)
Admission H&P    Chief Complaint: "right sided hemiparesis and slurred speech" HPI: Anna Lyons is an 56 y.o. female with new right-sided hemiparesis since about 12:00 pm today who had NIHSS of 7 on arrival and was a stroke code that had received t-PA. She subsequently went for an emergent angiogram.   LSN: 12:00 pm tPA Given: Yes MRankin: 0  Past Medical History  Diagnosis Date  . Depression   . Hypertension    History reviewed. No pertinent past surgical history.  History reviewed. No pertinent family history. Social History:  does not have a smoking history on file. She does not have any smokeless tobacco history on file. Her alcohol and drug histories not on file.  Allergies:  Allergies  Allergen Reactions  . Codeine Itching   Medications Prior to Admission  Medication Dose Route Frequency Provider Last Rate Last Dose  . 0.9 %  sodium chloride infusion  250 mL Intravenous Once Dione Booze, MD 250 mL/hr at 04/20/11 1449 250 mL at 04/20/11 1449  . 0.9 %  sodium chloride infusion   Intravenous Continuous Carmell Austria, MD      . acetaminophen (TYLENOL) tablet 650 mg  650 mg Oral Q4H PRN Carmell Austria, MD       Or  . acetaminophen (TYLENOL) suppository 650 mg  650 mg Rectal Q4H PRN Carmell Austria, MD      . alteplase (ACTIVASE) 1 mg/mL infusion 64 mg  0.9 mg/kg (Order-Specific) Intravenous Once Carmell Austria, MD   64 mg at 04/20/11 1446  . iohexol (OMNIPAQUE) 300 MG/ML solution 150 mL  150 mL Intra-arterial Once PRN Oneal Grout, MD   50 mL at 04/20/11 1542  . labetalol (NORMODYNE,TRANDATE) injection 10 mg  10 mg Intravenous Q10 min PRN Carmell Austria, MD      . ondansetron [facility]) injection 4 mg  4 mg Intravenous Q6H PRN Carmell Austria, MD      . pantoprazole (PROTONIX) injection 40 mg  40 mg Intravenous QHS Carmell Austria, MD      . senna-docusate (Senokot-S) tablet 1 tablet  1 tablet Oral QHS PRN Carmell Austria, MD      . DISCONTD: alteplase (ACTIVASE) 1 mg/mL infusion 0.9 mg/kg  0.9  mg/kg Intravenous Once Dione Booze, MD       No current outpatient prescriptions on file as of 04/20/2011.   ROS: as above   Physical Examination: Blood pressure 128/86, pulse 68, temperature 98.3 F (36.8 C), temperature source Oral, resp. rate 15, weight 71.215 kg (157 lb), SpO2 96.00%.  Neurologic Examination: Neurological exam: AAO*3. No aphasia. Followed complex commands. Cranial nerves: EOMI, PERRL. Visual fields were full. Sensation to V1 through V3 areas of the face was intact and symmetric throughout. There was right facial droop. Shoulder shrug was 5/5 and symmetric bilaterally. Head rotation was 5/5 bilaterally. There was no dysarthria or palatal deviation. Motor: strength was 5/5 on the left and 2/5 in RUE and RLE. Sensory: was intact throughout to light touch, pinprick. Coordination: finger-to-nose were intact and symmetric bilaterally. Reflexes: were 2+ in upper extremities and 1+ at the knees and 1+ at the ankles. Plantar response was downgoing bilaterally. Gait: deferred.  Results for orders placed during the hospital encounter of 04/20/11 (from the past 48 hour(s))  PROTIME-INR     Status: Normal   Collection Time   04/20/11  2:05 PM      Component Value Range Comment   Prothrombin Time 13.8  11.6 - 15.2 (seconds)  INR 1.04  0.00 - 1.49    APTT     Status: Normal   Collection Time   04/20/11  2:05 PM      Component Value Range Comment   aPTT 31  24 - 37 (seconds)   CBC     Status: Abnormal   Collection Time   04/20/11  2:05 PM      Component Value Range Comment   WBC 6.1  4.0 - 10.5 (K/uL)    RBC 3.91  3.87 - 5.11 (MIL/uL)    Hemoglobin 13.8  12.0 - 15.0 (g/dL)    HCT 40.9  81.1 - 91.4 (%)    MCV 98.5  78.0 - 100.0 (fL)    MCH 35.3 (*) 26.0 - 34.0 (pg)    MCHC 35.8  30.0 - 36.0 (g/dL)    RDW 78.2  95.6 - 21.3 (%)    Platelets 165  150 - 400 (K/uL)   DIFFERENTIAL     Status: Normal   Collection Time   04/20/11  2:05 PM      Component Value Range Comment    Neutrophils Relative 61  43 - 77 (%)    Neutro Abs 3.8  1.7 - 7.7 (K/uL)    Lymphocytes Relative 29  12 - 46 (%)    Lymphs Abs 1.8  0.7 - 4.0 (K/uL)    Monocytes Relative 9  3 - 12 (%)    Monocytes Absolute 0.5  0.1 - 1.0 (K/uL)    Eosinophils Relative 1  0 - 5 (%)    Eosinophils Absolute 0.1  0.0 - 0.7 (K/uL)    Basophils Relative 1  0 - 1 (%)    Basophils Absolute 0.0  0.0 - 0.1 (K/uL)   COMPREHENSIVE METABOLIC PANEL     Status: Normal   Collection Time   04/20/11  2:05 PM      Component Value Range Comment   Sodium 136  135 - 145 (mEq/L)    Potassium 4.1  3.5 - 5.1 (mEq/L)    Chloride 100  96 - 112 (mEq/L)    CO2 27  19 - 32 (mEq/L)    Glucose, Bld 86  70 - 99 (mg/dL)    BUN 12  6 - 23 (mg/dL)    Creatinine, Ser 0.86  0.50 - 1.10 (mg/dL)    Calcium 9.4  8.4 - 10.5 (mg/dL)    Total Protein 7.1  6.0 - 8.3 (g/dL)    Albumin 3.6  3.5 - 5.2 (g/dL)    AST 21  0 - 37 (U/L)    ALT 11  0 - 35 (U/L)    Alkaline Phosphatase 70  39 - 117 (U/L)    Total Bilirubin 0.5  0.3 - 1.2 (mg/dL)    GFR calc non Af Amer >90  >90 (mL/min)    GFR calc Af Amer >90  >90 (mL/min)   CK TOTAL AND CKMB     Status: Normal   Collection Time   04/20/11  2:05 PM      Component Value Range Comment   Total CK 65  7 - 177 (U/L)    CK, MB 1.7  0.3 - 4.0 (ng/mL)    Relative Index RELATIVE INDEX IS INVALID  0.0 - 2.5    TROPONIN I     Status: Normal   Collection Time   04/20/11  2:05 PM      Component Value Range Comment   Troponin I <0.30  <0.30 (ng/mL)  POCT I-STAT, CHEM 8     Status: Normal   Collection Time   04/20/11  2:09 PM      Component Value Range Comment   Sodium 138  135 - 145 (mEq/L)    Potassium 4.0  3.5 - 5.1 (mEq/L)    Chloride 102  96 - 112 (mEq/L)    BUN 13  6 - 23 (mg/dL)    Creatinine, Ser 1.61  0.50 - 1.10 (mg/dL)    Glucose, Bld 91  70 - 99 (mg/dL)    Calcium, Ion 0.96  1.12 - 1.32 (mmol/L)    TCO2 27  0 - 100 (mmol/L)    Hemoglobin 14.3  12.0 - 15.0 (g/dL)    HCT 04.5  40.9 - 81.1  (%)    Ct Head Wo Contrast  04/20/2011  *RADIOLOGY REPORT*  Clinical Data: Right-sided weakness.  Evaluate for stroke.  CT HEAD WITHOUT CONTRAST  Technique:  Contiguous axial images were obtained from the base of the skull through the vertex without contrast.  Comparison: Head CT 08/08/2010  Findings: Hypodensity in the right putamen is unchanged from prior CT and may reflect a remote lacunar infarction.  Minimal subcortical white matter hypoattenuation bilaterally is stable. There is no hemorrhage.  No evidence of acute cortically based infarction.  The ventricles are normal and stable in size.  The visualized paranasal sinuses and mastoid air cells are clear.  The skull is intact.  IMPRESSION: 1.  Stable head CT.  No acute intracranial abnormality identified. 2.  Stable mild subcortical white matter hypoattenuation, likely reflecting chronic microvascular ischemia. 3.  Stable, remote right putamen lacunar infarct.  Original Report Authenticated By: Britta Mccreedy, M.D.   Dg Chest Portable 1 View  04/20/2011  *RADIOLOGY REPORT*  Clinical Data: Stroke symptoms  PORTABLE CHEST - 1 VIEW  Comparison: 08/07/2010  Findings: Borderline cardiomegaly.  Clear lungs.  Bronchitic changes are stable.  No edema.  No consolidation.  No pneumothorax or pleural effusion.  IMPRESSION: Borderline cardiomegaly without decompensation.  Original Report Authenticated By: Donavan Burnet, M.D.   Assessment: 56 y.o. female with acute CVA presenting as right-sided hemiparesis, slurred speech and right facial droop  Stroke Risk Factors - HTN  Plan: 1. HgbA1c, fasting lipid panel 2. MRI, MRA  of the brain without contrast 3. PT consult, OT consult, Speech consult 4. Echocardiogram 5. Carotid dopplers 6. Prophylactic therapy-Antiplatelet med: Aspirin - none, needs to be started on ASA if no stroke on CT head in 24 hours after t-PA 7. Risk factor modification 8. ICU 9. Post t-PA order set  LOS: 0 days   Arasely Akkerman

## 2011-04-20 NOTE — ED Provider Notes (Signed)
56 year old female presents with a one to two-hour history of right-sided weakness. She has some expressive aphasia as well. She was taken immediately for a CT scan which is unremarkable. She is a thrombolytic candidate and is seen by the neurologist who is proceeding with TPA administration.  ECG shows normal sinus rhythm with a rate of 72, no ectopy. Normal axis. Normal P wave. Normal QRS. Normal intervals. Normal ST and T waves. Impression: normal ECG. Compared with ECG from 08/08/2010, left anterior fascicular block is no longer present.  CRITICAL CARE Performed by: Dione Booze   Total critical care time: 45 minutes  Critical care time was exclusive of separately billable procedures and treating other patients.  Critical care was necessary to treat or prevent imminent or life-threatening deterioration.  Critical care was time spent personally by me on the following activities: development of treatment plan with patient and/or surrogate as well as nursing, discussions with consultants, evaluation of patient's response to treatment, examination of patient, obtaining history from patient or surrogate, ordering and performing treatments and interventions, ordering and review of laboratory studies, ordering and review of radiographic studies, pulse oximetry and re-evaluation of patient's condition.    Dione Booze, MD 04/20/11 1434

## 2011-04-20 NOTE — Code Documentation (Signed)
56 yo female who was at home, getting ready for work at 1200 when she had sudden onset of R side weakness and difficulty walking, R facial droop. Husband recognized stroke sx and drove her to her primary physician about 5 minutes away. The staff there called 9-1-1 and EMS activated code stroke at 1335, finding the pt with flaccid R side and R facial droop.  On arrival to [facility] at 1355, the pt was cleared at the bridge by Dr. Preston Fleeting at 1355.  The stroke team was in the ED at 1345. Ct was read by Dr. Lyman Speller at [phone].  NIHSS revealed score of 6 initially, with R side improving. But sx were waxing and waning, with RUE score 1, then 3. After review of inclusion/exclusion criteria, and discussion with pt and husband, decision was made to give full dose IV  tPA and IR was consulted by Dr. Lyman Speller. tPA was started at 1446, 51 minutes after arrival, and 2h 46 min after sx onset. Handoff was given to IR team, and neuro assessment completed together on arrival to IR suite. VS remained stable. Pt and husband are aware of treatment plan and agreeable.

## 2011-04-20 NOTE — Procedures (Signed)
S/P Bilateral CCA and Lt Vert artery angiograms  RT CFA approach  Preliminary findings  1,. No occlusions,dissections stenosis or filling defects seen

## 2011-04-20 NOTE — Progress Notes (Signed)
VASCULAR LAB PRELIMINARY  PRELIMINARY  PRELIMINARY  PRELIMINARY  Carotid duplex completed.    Preliminary report:  Bilateral:  No evidence of hemodynamically significant internal carotid artery stenosis.   Vertebral artery flow is antegrade.     Jeter Tomey D, RVS 04/20/2011, 5:50 PM

## 2011-04-20 NOTE — H&P (Signed)
Anna Lyons is an 56 y.o. female.   Chief Complaint: CVA; rt side weak HPI: scheduled for cerebral arteriogram asap  Past Medical History  Diagnosis Date  . Depression   . Hypertension     History reviewed. No pertinent past surgical history.  History reviewed. No pertinent family history. Social History:  does not have a smoking history on file. She does not have any smokeless tobacco history on file. Her alcohol and drug histories not on file.  Allergies:  Allergies  Allergen Reactions  . Codeine Itching    Medications Prior to Admission  Medication Dose Route Frequency Provider Last Rate Last Dose  . 0.9 %  sodium chloride infusion  250 mL Intravenous Once Dione Booze, MD      . alteplase (ACTIVASE) 1 mg/mL infusion 64 mg  0.9 mg/kg (Order-Specific) Intravenous Once Carmell Austria, MD      . DISCONTD: alteplase (ACTIVASE) 1 mg/mL infusion 0.9 mg/kg  0.9 mg/kg Intravenous Once Dione Booze, MD       No current outpatient prescriptions on file as of 04/20/2011.    Results for orders placed during the hospital encounter of 04/20/11 (from the past 48 hour(s))  PROTIME-INR     Status: Normal   Collection Time   04/20/11  2:05 PM      Component Value Range Comment   Prothrombin Time 13.8  11.6 - 15.2 (seconds)    INR 1.04  0.00 - 1.49    APTT     Status: Normal   Collection Time   04/20/11  2:05 PM      Component Value Range Comment   aPTT 31  24 - 37 (seconds)   CBC     Status: Abnormal   Collection Time   04/20/11  2:05 PM      Component Value Range Comment   WBC 6.1  4.0 - 10.5 (K/uL)    RBC 3.91  3.87 - 5.11 (MIL/uL)    Hemoglobin 13.8  12.0 - 15.0 (g/dL)    HCT 16.1  09.6 - 04.5 (%)    MCV 98.5  78.0 - 100.0 (fL)    MCH 35.3 (*) 26.0 - 34.0 (pg)    MCHC 35.8  30.0 - 36.0 (g/dL)    RDW 40.9  81.1 - 91.4 (%)    Platelets 165  150 - 400 (K/uL)   DIFFERENTIAL     Status: Normal   Collection Time   04/20/11  2:05 PM      Component Value Range Comment   Neutrophils  Relative 61  43 - 77 (%)    Neutro Abs 3.8  1.7 - 7.7 (K/uL)    Lymphocytes Relative 29  12 - 46 (%)    Lymphs Abs 1.8  0.7 - 4.0 (K/uL)    Monocytes Relative 9  3 - 12 (%)    Monocytes Absolute 0.5  0.1 - 1.0 (K/uL)    Eosinophils Relative 1  0 - 5 (%)    Eosinophils Absolute 0.1  0.0 - 0.7 (K/uL)    Basophils Relative 1  0 - 1 (%)    Basophils Absolute 0.0  0.0 - 0.1 (K/uL)   POCT I-STAT, CHEM 8     Status: Normal   Collection Time   04/20/11  2:09 PM      Component Value Range Comment   Sodium 138  135 - 145 (mEq/L)    Potassium 4.0  3.5 - 5.1 (mEq/L)    Chloride 102  96 -  112 (mEq/L)    BUN 13  6 - 23 (mg/dL)    Creatinine, Ser 4.09  0.50 - 1.10 (mg/dL)    Glucose, Bld 91  70 - 99 (mg/dL)    Calcium, Ion 8.11  1.12 - 1.32 (mmol/L)    TCO2 27  0 - 100 (mmol/L)    Hemoglobin 14.3  12.0 - 15.0 (g/dL)    HCT 91.4  78.2 - 95.6 (%)    Ct Head Wo Contrast  04/20/2011  *RADIOLOGY REPORT*  Clinical Data: Right-sided weakness.  Evaluate for stroke.  CT HEAD WITHOUT CONTRAST  Technique:  Contiguous axial images were obtained from the base of the skull through the vertex without contrast.  Comparison: Head CT 08/08/2010  Findings: Hypodensity in the right putamen is unchanged from prior CT and may reflect a remote lacunar infarction.  Minimal subcortical white matter hypoattenuation bilaterally is stable. There is no hemorrhage.  No evidence of acute cortically based infarction.  The ventricles are normal and stable in size.  The visualized paranasal sinuses and mastoid air cells are clear.  The skull is intact.  IMPRESSION: 1.  Stable head CT.  No acute intracranial abnormality identified. 2.  Stable mild subcortical white matter hypoattenuation, likely reflecting chronic microvascular ischemia. 3.  Stable, remote right putamen lacunar infarct.  Original Report Authenticated By: Britta Mccreedy, M.D.    Review of Systems  Constitutional: Negative for fever.  Respiratory: Negative for cough.     Gastrointestinal: Negative for nausea and vomiting.  Neurological: Positive for focal weakness and headaches.       Rt side weakness    Blood pressure 174/82, pulse 66, temperature 97.9 F (36.6 C), temperature source Oral, resp. rate 14, weight 157 lb (71.215 kg), SpO2 97.00%. Physical Exam  Constitutional: She is oriented to person, place, and time. She appears well-developed and well-nourished.  HENT:  Head: Normocephalic.  Neck: Normal range of motion.  Cardiovascular: Normal rate and regular rhythm.   No murmur heard. Respiratory: Effort normal and breath sounds normal.  GI: Bowel sounds are normal. There is no tenderness.  Neurological: She is alert and oriented to person, place, and time.  Skin: Skin is warm.     Assessment/Plan CVA; rt side weak Scheduled for cerebral arteriogram Pt and husband aware of procedure benefits and risks and agreeable to proceed. Consent signed by husband  Tou Hayner A 04/20/2011, 2:47 PM

## 2011-04-21 ENCOUNTER — Inpatient Hospital Stay (HOSPITAL_COMMUNITY): Payer: Self-pay

## 2011-04-21 ENCOUNTER — Encounter (HOSPITAL_COMMUNITY): Payer: Self-pay | Admitting: Neurology

## 2011-04-21 DIAGNOSIS — I6789 Other cerebrovascular disease: Secondary | ICD-10-CM

## 2011-04-21 LAB — LIPID PANEL
Cholesterol: 144 mg/dL (ref 0–200)
Total CHOL/HDL Ratio: 3.3 RATIO
VLDL: 18 mg/dL (ref 0–40)

## 2011-04-21 MED ORDER — TRAMADOL HCL 50 MG PO TABS
100.0000 mg | ORAL_TABLET | Freq: Four times a day (QID) | ORAL | Status: DC | PRN
  Administered 2011-04-22: 100 mg via ORAL
  Filled 2011-04-21 (×3): qty 2

## 2011-04-21 MED ORDER — ROPINIROLE HCL 0.5 MG PO TABS
0.5000 mg | ORAL_TABLET | Freq: Every day | ORAL | Status: DC
  Administered 2011-04-21 – 2011-04-22 (×2): 0.5 mg via ORAL
  Filled 2011-04-21 (×4): qty 1

## 2011-04-21 MED ORDER — ASPIRIN EC 325 MG PO TBEC
325.0000 mg | DELAYED_RELEASE_TABLET | Freq: Every day | ORAL | Status: DC
  Administered 2011-04-21 – 2011-04-23 (×3): 325 mg via ORAL
  Filled 2011-04-21 (×3): qty 1

## 2011-04-21 MED ORDER — HYDROCHLOROTHIAZIDE 50 MG PO TABS
50.0000 mg | ORAL_TABLET | Freq: Every day | ORAL | Status: DC
  Administered 2011-04-21 – 2011-04-23 (×3): 50 mg via ORAL
  Filled 2011-04-21 (×3): qty 1

## 2011-04-21 MED ORDER — SERTRALINE HCL 50 MG PO TABS
50.0000 mg | ORAL_TABLET | Freq: Every day | ORAL | Status: DC
  Administered 2011-04-21 – 2011-04-23 (×3): 50 mg via ORAL
  Filled 2011-04-21 (×3): qty 1

## 2011-04-21 MED FILL — Fentanyl Citrate Inj 0.05 MG/ML: INTRAMUSCULAR | Qty: 2 | Status: AC

## 2011-04-21 MED FILL — Hydralazine HCl Inj 20 MG/ML: INTRAMUSCULAR | Qty: 1 | Status: AC

## 2011-04-21 MED FILL — Midazolam HCl Inj 2 MG/2ML (Base Equivalent): INTRAMUSCULAR | Qty: 2 | Status: AC

## 2011-04-21 NOTE — Progress Notes (Signed)
  Echocardiogram 2D Echocardiogram has been performed.  Anna Lyons 04/21/2011, 5:57 PM

## 2011-04-21 NOTE — Progress Notes (Signed)
Speech Language/Pathology Speech Language Pathology Evaluation Patient Details Name: Anna Lyons MRN: [id number] DOB: [birth year] Today's Date: 04/21/2011  Problem List:  Patient Active Problem List  Diagnoses  . Depression   Past Medical History:  Past Medical History  Diagnosis Date  . Depression   . Hypertension   . Stroke     TIA in 08/2010   Past Surgical History:  Past Surgical History  Procedure Date  . Mastectomy     bilateral with reconstruction in the 80s    SLP Assessment/Plan/Recommendation Assessment Clinical Impression Statement: Pt present with language and cognition WFL. Mild dysarthria at conversation level due to right labial/lingual weakness. Pt would benefit from 1-2 visits from SLP to provided traning in clear speech strategies and home oral motor exercises for improved intellegibility after d/c.  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 ) Plan Speech Therapy Frequency: min 1 x/week Duration: 1 week Treatment/Interventions: Oral motor exercises;SLP instruction and feedback;Compensatory strategies;Patient/family education Potential to Achieve Goals: Good SLP Recommendations Follow up Recommendations: None Individuals Consulted Consulted and Agree with Results and Recommendations: Patient  SLP Goals  SLP Goals Potential to Achieve Goals: Good Progress/Goals/Alternative treatment plan discussed with pt/caregiver and they: Agree SLP Goal #1: Pt will review oral motor exercises with SLP to follow independently with min verbal assist.  SLP Goal #2: Pt will utilize slow, loud, overarticulated speech with pauses at conversation level with moderate verbal cues Harlon Ditty, MA CCC-SLP [phone]  Anna Lyons 04/21/2011, 11:09 AM

## 2011-04-21 NOTE — Progress Notes (Signed)
Stroke Team Progress Note  HISTORY Anna Lyons is an 56 y.o. female with new right-sided hemiparesis since about 12:00 pm 04/20/2011 who had NIHSS of 7 on arrival and was a stroke code that had received t-PA. She subsequently went for an emergent angiogram which did not show any large vessel occlusion or stenosis   SUBJECTIVE Her daughter is at the bedside. Overall she feels her condition is unchanged. She complains of headache. She states her right-sided weakness has improved but not back to baseline.  OBJECTIVE Filed Vitals:   04/21/11 0400 04/21/11 0500 04/21/11 0600 04/21/11 0700  BP: 147/73 136/74 144/84 158/83  Pulse: 63 60 71 60  Temp: 98.2 F (36.8 C)     TempSrc: Oral     Resp: 12 12 15 13   Height:      Weight:      SpO2: 94% 94% 96% 94%   CBG (last 3) No results found for this basename: GLUCAP:3 in the last 72 hours Intake/Output from previous day: 03/19 0701 - 03/20 0700 In: 672.8 [I.V.:672.8] Out: 1060 [Urine:1060]  IV Fluid Intake:     . sodium chloride 75 mL/hr at 04/21/11 0125  . sodium chloride     Medications    . sodium chloride  250 mL Intravenous Once  . alteplase  0.9 mg/kg (Order-Specific) Intravenous Once  . pantoprazole (PROTONIX) IV  40 mg Intravenous QHS  . rOPINIRole  0.5 mg Oral Once  . DISCONTD: alteplase  0.9 mg/kg Intravenous Once  PRN acetaminophen, acetaminophen, iohexol, labetalol, ondansetron (ZOFRAN) IV, senna-docusate  Diet:  NPO Activity:  Bedrest  DVT Prophylaxis:  SCDs   Significant Diagnostic Studies: CBC    Component Value Date/Time   WBC 6.1 04/20/2011 1405   RBC 3.91 04/20/2011 1405   HGB 14.3 04/20/2011 1409   HCT 42.0 04/20/2011 1409   PLT 165 04/20/2011 1405   MCV 98.5 04/20/2011 1405   MCH 35.3* 04/20/2011 1405   MCHC 35.8 04/20/2011 1405   RDW 12.4 04/20/2011 1405   LYMPHSABS 1.8 04/20/2011 1405   MONOABS 0.5 04/20/2011 1405   EOSABS 0.1 04/20/2011 1405   BASOSABS 0.0 04/20/2011 1405   CMP    Component Value Date/Time    NA 138 04/20/2011 1409   K 4.0 04/20/2011 1409   CL 102 04/20/2011 1409   CO2 27 04/20/2011 1405   GLUCOSE 91 04/20/2011 1409   BUN 13 04/20/2011 1409   CREATININE 0.70 04/20/2011 1409   CALCIUM 9.4 04/20/2011 1405   PROT 7.1 04/20/2011 1405   ALBUMIN 3.6 04/20/2011 1405   AST 21 04/20/2011 1405   ALT 11 04/20/2011 1405   ALKPHOS 70 04/20/2011 1405   BILITOT 0.5 04/20/2011 1405   GFRNONAA >90 04/20/2011 1405   GFRAA >90 04/20/2011 1405   COAGS Lab Results  Component Value Date   INR 1.04 04/20/2011   INR 0.92 08/08/2010   Lipid Panel    Component Value Date/Time   CHOL 144 04/21/2011 0400   TRIG 88 04/21/2011 0400   HDL 44 04/21/2011 0400   CHOLHDL 3.3 04/21/2011 0400   VLDL 18 04/21/2011 0400   LDLCALC 82 04/21/2011 0400   HgbA1C  Lab Results  Component Value Date   HGBA1C 5.7* 08/07/2010   Urine Drug Screen  No results found for this basename: labopia, cocainscrnur, labbenz, amphetmu, thcu, labbarb    Alcohol Level No results found for this basename: eth   Cardiac enzymes neg MRSA neg  CT of the brain  04/20/2011  1.  Stable head CT.  No acute intracranial abnormality identified. 2.  Stable mild subcortical white matter hypoattenuation, likely reflecting chronic microvascular ischemia. 3.  Stable, remote right putamen lacunar infarct.   Cerebral angio  04/20/2011 S/P Bilateral CCA and Lt Vert artery angiograms, RT CFA approach, No occlusions,dissections stenosis or filling defects seen   MRI of the brain  ordered  MRA of the brain  Ordered. Will cancel. See angio  2D Echocardiogram  ,ordered   Carotid Doppler  No internal carotid artery stenosis bilaterally. Vertebrals with antegrade flow bilaterally.   CXR  04/20/2011 Borderline cardiomegaly without decompensation.  EKG  Pending Physical Exam   pleasant elderly Caucasian lady currently not in distress. Afebrile. Head is nontraumatic. Neck is supple without bruit. Hearing is normal. Cardiac exam no murmur or gallop. Lungs are  clear to auscultation. Distal pulses are well felt.   Neurological Exam : Awake  Alert oriented x 3. Normal speech and language.eye movements full without nystagmus. Face asymmetric with mild right lower face weakness. Tongue midline. Normal strength, tone, reflexes and coordination on the left. Mild right upper and lower extremity drift  with 3/5 strength. Weakness of right grip and intrinsic hand muscles.. Normal sensation. Gait deferred.  ASSESSMENT Anna Lyons is a 56 y.o. 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 Not on antiplatelets prior admission. Patient with resultant hemiparesis/ weakness of right face, right arm(s) or right lower leg(s)  -hypertension - cigarette smoker -headache, no relief from tylenol, allergic to codeine  Hospital day # 1  TREATMENT/PLAN - discontinue  groin arterial sheath, once bedrest over, may be OOB - MRI around 1400. May cancel CT if MRI completed within +/- 4 hr of time ordered - Needs aspirin 325 mg orally every day for secondary stroke prevention prior to 80m tonight if post tpa imaging negative for hemorrhage - smoking cessation counselor - ultram for headache -Discussed with patient and daughter and answered questions  This patient is critically ill and at significant risk of neurological worsening, death and care requires constant monitoring of vital signs, hemodynamics,respiratory and cardiac monitoring, neurological assessment, discussion with family, other specialists and medical decision making of high complexity. I spent 30 minutes of neurocritical care time  in the care of  this patient.   Joaquin Music, ANP-BC, GNP-BC [facility] Pager: [phone] 04/21/2011 7:51 AM  Dr. Delia Heady, Stroke Center Medical Director, has personally reviewed chart, pertinent data, examined the patient and developed  the plan of care.

## 2011-04-21 NOTE — Progress Notes (Signed)
5 french sheath pull. No hematoma present prior to sheath pull, pulled sheath at 1446 and held manual pressure a V pad for 15 minutes. Hemotasis achieved at 1501 and no hematoma. Pressure dressing applied and 10 lb. sandbag placed over right hip.   Magda Bernheim, RTR Lynann Beaver, RTR  [address] [place]

## 2011-04-21 NOTE — Progress Notes (Signed)
PT Cancellation Note  Evaluation cancelled as pt is not yet out of 24 hour tPA window. Will follow up as appropriate.  Thanks!  Kersti Scavone (Beverely Pace) Carleene Mains PT, DPT Acute Rehabilitation [phone]

## 2011-04-22 NOTE — Progress Notes (Signed)
Occupational Therapy Evaluation Patient Details Name: Anna Lyons MRN: [id number] DOB: 1955/05/17 Today's Date: 04/22/2011  Problem List:  Patient Active Problem List  Diagnoses  . Depression    Past Medical History:  Past Medical History  Diagnosis Date  . Depression   . Hypertension   . Stroke     TIA in 08/2010   Past Surgical History:  Past Surgical History  Procedure Date  . Mastectomy     bilateral with reconstruction in the 80s    OT Assessment/Plan/Recommendation OT Assessment Clinical Impression Statement: Pt admitted right sided numbness and weakness and slurred speech.  MRI positive for left stroke. Will benefit from acute OT services to address below problem list in prep for d/c to CIR. OT Recommendation/Assessment: Patient will need skilled OT in the acute care venue OT Problem List: Decreased activity tolerance;Decreased strength;Impaired balance (sitting and/or standing);Decreased coordination;Decreased knowledge of use of DME or AE;Pain;Impaired UE functional use OT Therapy Diagnosis : Generalized weakness;Paresis OT Plan OT Frequency: Min 2X/week OT Treatment/Interventions: Self-care/ADL training;DME and/or AE instruction;Therapeutic activities;Neuromuscular education OT Recommendation Recommendations for Other Services: Rehab consult Follow Up Recommendations: Inpatient Rehab Equipment Recommended: Defer to next venue Individuals Consulted Consulted and Agree with Results and Recommendations: Patient OT Goals Acute Rehab OT Goals OT Goal Formulation: With patient Time For Goal Achievement: 2 weeks ADL Goals Pt Will Perform Grooming: with modified independence;Sitting at sink;Standing at sink ADL Goal: Grooming - Progress: Goal set today Pt Will Perform Upper Body Bathing: with modified independence ADL Goal: Upper Body Bathing - Progress: Goal set today Pt Will Perform Lower Body Bathing: with modified independence;Sit to stand from chair;Sit to stand  from bed ADL Goal: Lower Body Bathing - Progress: Goal set today Pt Will Transfer to Toilet: with supervision;Ambulation;with DME;Regular height toilet;3-in-1 ADL Goal: Toilet Transfer - Progress: Goal set today Pt Will Perform Toileting - Clothing Manipulation: with supervision;Standing ADL Goal: Toileting - Clothing Manipulation - Progress: Goal set today Pt Will Perform Toileting - Hygiene: with modified independence;Sitting on 3-in-1 or toilet ADL Goal: Toileting - Hygiene - Progress: Goal set today Miscellaneous OT Goals Miscellaneous OT Goal #1: Pt will incorporate RUE during 75% of ADL activity. OT Goal: Miscellaneous Goal #1 - Progress: Goal set today  OT Evaluation Precautions/Restrictions  Precautions Precautions: Fall Restrictions Weight Bearing Restrictions: No Prior Functioning Home Living Lives With: Spouse Receives Help From: Family (daughter) Type of Home: House Home Layout: Two level;Able to live on main level with bedroom/bathroom Alternate Level Stairs-Rails: Right Alternate Level Stairs-Number of Steps: 12 Home Access: Ramped entrance Bathroom Shower/Tub: Tub only;Tub/shower unit;Curtain (tub only downstairs, tub/shower upstairs) Bathroom Toilet: Standard Bathroom Accessibility: Yes How Accessible: Accessible via walker Home Adaptive Equipment: Wheelchair - manual;Walker - rolling;Bedside commode/3-in-1;Tub transfer bench Prior Function Level of Independence: Independent with basic ADLs;Independent with gait;Independent with transfers Able to Take Stairs?: Yes Driving: Yes Vocation: Full time employment Vocation Requirements: Conservation officer, nature at [employer] ADL ADL Eating/Feeding: Performed;Set up Eating/Feeding Details (indicate cue type and reason): setup to cut food and open containers Where Assessed - Eating/Feeding: Bed level Lower Body Dressing: Performed;Set up Lower Body Dressing Details (indicate cue type and reason): Pt donne bil. slip on shoes  using bil. UE and ankles crossed over legs. Where Assessed - Lower Body Dressing: Sitting, bed Toilet Transfer: Simulated;+2 Total assistance;Comment for patient % (70%) Toilet Transfer Details (indicate cue type and reason): bil. HHA for steadying and balance. bed to chair. Toilet Transfer Method: Proofreader: Other (comment) (chair) Equipment  Used: Other (comment) (bil. HHA) Ambulation Related to ADLs: Pt with shuffled gait and Rt. knee buckling ADL Comments: Pt encourage to incorporate use of Rt. UE during ADL activity.  Anticipate pt will require min assist with UB ADLs due to decreased functional use of Rt. UE. Vision/Perception    Cognition Cognition Arousal/Alertness: Awake/alert Overall Cognitive Status: Appears within functional limits for tasks assessed Orientation Level: Oriented X4 Sensation/Coordination Sensation Light Touch: Appears Intact Coordination Gross Motor Movements are Fluid and Coordinated: No (R UE) Fine Motor Movements are Fluid and Coordinated: No (R UE) Coordination and Movement Description: Pt with questionable motor planning deficits. Extremity Assessment RUE Assessment RUE Assessment: Exceptions to [facility] RUE Strength RUE Overall Strength Comments: 3+/5 LUE Assessment LUE Assessment: Within Functional Limits (4/5) Mobility  Bed Mobility Bed Mobility: Yes Supine to Sit: 4: Min assist Supine to Sit Details (indicate cue type and reason): min guard assist. for safety with cues for technique. Pt required extrra time to complete bed mobility task Transfers Sit to Stand: 1: +2 Total assist;Patient percentage (comment);From bed (70%) Sit to Stand Details (indicate cue type and reason): +2 assist to maintain balance during transfer with cues for hand placement.  Pt with initial Rt. knee buckling. Stand to Sit: 4: Min assist;To chair/3-in-1 Stand to Sit Details: cueing to control descent and for safe hand placement Exercises   End  of Session OT - End of Session Equipment Utilized During Treatment: Gait belt Activity Tolerance: Patient tolerated treatment well Patient left: in chair;with call bell in reach;with family/visitor present Nurse Communication: Mobility status for transfers General Behavior During Session: [facility] for tasks performed Cognition: [facility] for tasks performed  3:38 PM  04/22/2011 Cipriano Mile OTR/L Pager [phone] Office [phone]

## 2011-04-22 NOTE — Progress Notes (Addendum)
Speech Pathology:  Treatment Note  Subjective:  "It feels good to be sitting up in the chair"  Objective:  Patient exhibited mild dysarthric speech at the conversational level  Assessment:  SLP facilitated session with minimal assist semantic, visual cues faded to supervision level semantic cues to utilize slow, over articulated speech; SLP educated patient on oral motor exercises with a handout and demonstration, patient able to return demonstration with minimal assist semantic cues.  Recommendations:  Patient's family able to assist with carryover of exercises and speech compensatory strategies.  As a result, no further follow up warranted during this acute stay; however, patient may benefit from higher level treatment at CIR.   Pain:   none Intervention Required:   No  Goals: Progressing  Fae Pippin, M.A., CCC-SLP [phone]

## 2011-04-22 NOTE — ED Notes (Signed)
I saw and evaluated the patient, reviewed the resident's note and I agree with the findings and plan.  Dione Booze, MD 04/22/11 1438

## 2011-04-22 NOTE — Consult Note (Signed)
Physical Medicine and Rehabilitation Consult Reason for Consult: Stroke Referring Phsyician: Dr. Serita Kyle is an 56 y.o. female.   HPI: 56 year old right-handed female with history of TIA in July 2012 admitted March 19 with right-sided weakness and slurred speech. 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 Carotid Dopplers with no internal carotid artery stenosis. Echocardiogram pending. Cerebral angiogram with no occlusions or dissection noted. The patient did receive TPA. Neurology services consulted placed on aspirin therapy. Speech and occupational therapy evaluations are pending. M.D. request physical medicine rehabilitation consult The patient's husband had a right MCA infarct and was on my rehabilitation service about 2 years ago. He is now driving but still has a left upper extremity weakness. Patient's daughter is active and involved. Review of Systems  Gastrointestinal: Positive for constipation.  Neurological: Positive for dizziness and headaches.  All other systems reviewed and are negative.   Past Medical History  Diagnosis Date  . Depression   . Hypertension   . Stroke     TIA in 08/2010   Past Surgical History  Procedure Date  . Mastectomy     bilateral with reconstruction in the 80s   History reviewed. No pertinent family history. Social History:  reports that she has been smoking Cigarettes.  She has a 5 pack-year smoking history. She does not have any smokeless tobacco history on file. She reports that she drinks about .6 ounces of alcohol per week. She reports that she does not use illicit drugs. Allergies:  Allergies  Allergen Reactions  . Codeine Itching   Medications Prior to Admission  Medication Dose Route Frequency Provider Last Rate Last Dose  . 0.9 %  sodium chloride infusion  250 mL Intravenous Once Dione Booze, MD 250 mL/hr at  04/20/11 1449 250 mL at 04/20/11 1449  . 0.9 %  sodium chloride infusion   Intravenous Continuous Oneal Grout, MD 75 mL/hr at 04/21/11 0125    . acetaminophen (TYLENOL) tablet 650 mg  650 mg Oral Q4H PRN Carmell Austria, MD   650 mg at 04/21/11 1450   Or  . acetaminophen (TYLENOL) suppository 650 mg  650 mg Rectal Q4H PRN Carmell Austria, MD      . alteplase (ACTIVASE) 1 mg/mL infusion 64 mg  0.9 mg/kg (Order-Specific) Intravenous Once Carmell Austria, MD   64 mg at 04/20/11 1446  . aspirin EC tablet 325 mg  325 mg Oral Daily Thana Farr, MD   325 mg at 04/22/11 0934  . hydrochlorothiazide (HYDRODIURIL) tablet 50 mg  50 mg Oral Daily Layne Benton, NP   50 mg at 04/22/11 0934  . iohexol (OMNIPAQUE) 300 MG/ML solution 150 mL  150 mL Intra-arterial Once PRN Oneal Grout, MD   50 mL at 04/20/11 1542  . labetalol (NORMODYNE,TRANDATE) injection 10 mg  10 mg Intravenous Q10 min PRN Carmell Austria, MD      . ondansetron [facility]) injection 4 mg  4 mg Intravenous Q6H PRN Carmell Austria, MD      . pantoprazole (PROTONIX) injection 40 mg  40 mg Intravenous QHS Carmell Austria, MD   40 mg at 04/21/11 2154  . rOPINIRole (REQUIP) tablet 0.5 mg  0.5 mg Oral Once Noel Christmas   0.5 mg at 04/21/11 0018  . rOPINIRole (REQUIP) tablet 0.5 mg  0.5 mg Oral QHS Charles Stewart   0.5 mg at 04/21/11 2153  . senna-docusate (Senokot-S) tablet 1  tablet  1 tablet Oral QHS PRN Carmell Austria, MD      . sertraline (ZOLOFT) tablet 50 mg  50 mg Oral Daily Layne Benton, NP   50 mg at 04/22/11 0934  . traMADol (ULTRAM) tablet 100 mg  100 mg Oral Q6H PRN Layne Benton, NP      . DISCONTD: 0.9 %  sodium chloride infusion   Intravenous Continuous Carmell Austria, MD      . DISCONTD: alteplase (ACTIVASE) 1 mg/mL infusion 0.9 mg/kg  0.9 mg/kg Intravenous Once Dione Booze, MD       No current outpatient prescriptions on file as of 04/22/2011.    Home: Home Living Lives With: Spouse Receives Help From: Family (daughter) Type of Home:  House Home Layout: Two level;Able to live on main level with bedroom/bathroom Alternate Level Stairs-Rails: Right Alternate Level Stairs-Number of Steps: 12 Home Access: Ramped entrance Bathroom Shower/Tub: Tub only;Tub/shower unit;Curtain (tub only downstairs. tub/shower upstairs) Bathroom Toilet: Standard Bathroom Accessibility: Yes How Accessible: Accessible via walker Home Adaptive Equipment: Wheelchair - manual;Walker - rolling;Bedside commode/3-in-1;Tub transfer bench  Functional History: Prior Function Level of Independence: Independent with basic ADLs;Independent with gait;Independent with transfers Able to Take Stairs?: Yes Driving: Yes Vocation: Full time employment Vocation Requirements: Conservation officer, nature at [employer] Status:  Mobility: Bed Mobility Bed Mobility: Yes Supine to Sit: 4: Min assist (minguard) Supine to Sit Details (indicate cue type and reason): MInguard (A) for safety with cues for technique.  Pt needed extra time to complete bed mobility task. Transfers Transfers: Yes Sit to Stand: 1: +2 Total assist;Patient percentage (comment);From bed (pt 70%) Sit to Stand Details (indicate cue type and reason): +2 assist      ADL:    Cognition: Cognition Overall Cognitive Status: Appears within functional limits for tasks assessed Arousal/Alertness: Awake/alert Orientation Level: Oriented X4 Attention: Selective;Alternating Selective Attention: Appears intact Alternating Attention: Appears intact Memory: Appears intact Awareness: Appears intact Problem Solving: Appears intact Executive Function: Reasoning;Decision Making;Initiating;Self Monitoring Reasoning: Appears intact Decision Making: Appears intact Initiating: Appears intact Self Monitoring: Appears intact Safety/Judgment: Appears intact Cognition Arousal/Alertness: Awake/alert Overall Cognitive Status: Appears within functional limits for tasks assessed Orientation Level: Oriented  X4  Blood pressure 146/87, pulse 72, temperature 98.3 F (36.8 C), temperature source Oral, resp. rate 19, height 5\' 5"  (1.651 m), weight 64.7 kg (142 lb 10.2 oz), SpO2 97.00%. Physical Exam  Vitals reviewed. Constitutional: She is oriented to person, place, and time. She appears well-developed.  HENT:  Head: Normocephalic.  Neck: Normal range of motion. Neck supple. No thyromegaly present.  Cardiovascular: Normal rate.   Pulmonary/Chest: Breath sounds normal. She has no wheezes.  Abdominal: She exhibits no distension. There is no tenderness.  Musculoskeletal: She exhibits no edema.  Neurological: She is alert and oriented to person, place, and time.       Dysarthric speech but fully intelligible with right facial droop  Skin: Skin is warm and dry.  Psychiatric: She has a normal mood and affect.   NIH stroke scale is 4  Motor strength is 2 minus at the right deltoid, biceps, triceps, grip. 5/5 on the left side in the upper extremity. Motor strength is 4/5 in the right hip flexor, knee extensors, ankle dorsiflexor and 5/5 in these muscles on the left Sensation is intact to light touch. There is no evidence of tactile or visual extinction Visual fields are intact confrontation testing Cerebellar testing shows no evidence of ataxia.  No results found for this or any previous  visit (from the past 24 hour(s)). Ct Head Wo Contrast  04/20/2011  *RADIOLOGY REPORT*  Clinical Data: Right-sided weakness.  Evaluate for stroke.  CT HEAD WITHOUT CONTRAST  Technique:  Contiguous axial images were obtained from the base of the skull through the vertex without contrast.  Comparison: Head CT 08/08/2010  Findings: Hypodensity in the right putamen is unchanged from prior CT and may reflect a remote lacunar infarction.  Minimal subcortical white matter hypoattenuation bilaterally is stable. There is no hemorrhage.  No evidence of acute cortically based infarction.  The ventricles are normal and stable in  size.  The visualized paranasal sinuses and mastoid air cells are clear.  The skull is intact.  IMPRESSION: 1.  Stable head CT.  No acute intracranial abnormality identified. 2.  Stable mild subcortical white matter hypoattenuation, likely reflecting chronic microvascular ischemia. 3.  Stable, remote right putamen lacunar infarct.  Original Report Authenticated By: Britta Mccreedy, M.D.   Mr Brain Wo Contrast  04/21/2011  *RADIOLOGY REPORT*  Clinical Data: Stroke.  Status post t-PA.  MRI HEAD WITHOUT CONTRAST  Technique:  Multiplanar, multiecho pulse sequences of the brain and surrounding structures were obtained according to standard protocol without intravenous contrast.  Comparison: CT head without contrast 04/20/2011.  Findings: An acute non hemorrhagic infarct is present within the left lentiform nucleus.  There are more remote lacunar infarcts immediately adjacent to this area as well as a remote lacunar infarct in the right lentiform nucleus.  Mild periventricular and scattered subcortical T2 and FLAIR hyperintensities otherwise are noted.  Flow is present in the major intracranial arteries.  No mass lesion is present.  No focal hemorrhage is evident.  The paranasal sinuses and left mastoid air cells are clear.  There is fluid in the right mastoid air cells.  No obstructing nasopharyngeal lesion is evident.  IMPRESSION:  1.  Acute non hemorrhagic infarct involving the posterior left lentiform nucleus. 2.  More remote lacunar infarcts are present in the basal ganglia bilaterally. 3.  Mild atrophy and white matter disease.  This likely reflects the sequelae of chronic microvascular ischemia. 4.  Right mastoid effusion.  No obstructing nasopharyngeal lesion is evident.  Original Report Authenticated By: Jamesetta Orleans. MATTERN, M.D.   Dg Chest Portable 1 View  04/20/2011  *RADIOLOGY REPORT*  Clinical Data: Stroke symptoms  PORTABLE CHEST - 1 VIEW  Comparison: 08/07/2010  Findings: Borderline cardiomegaly.   Clear lungs.  Bronchitic changes are stable.  No edema.  No consolidation.  No pneumothorax or pleural effusion.  IMPRESSION: Borderline cardiomegaly without decompensation.  Original Report Authenticated By: Donavan Burnet, M.D.    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 Does the need for close, 24 hr/day medical supervision in concert with the patient's rehab needs make it unreasonable for this patient to be served in a less intensive setting? Potentially 2. Co-Morbidities requiring supervision/potential complications: Hypertension, restless legs 3. Due to bladder management, bowel management, safety, skin/wound care, disease management, medication administration and patient education, does the patient require 24 hr/day rehab nursing? Potentially 4. Does the patient require coordinated care of a physician, rehab nurse, PT (1-2 hrs/day, 5 days/week), OT (Wants to hrs/day, 5 days/week) and SLP (0.5-1 hrs/day, 5 days/week) to address physical and functional deficits in the context of the above medical diagnosis(es)? Potentially Addressing deficits in the following areas: balance, endurance, locomotion, strength, transferring, bathing, dressing, toileting and speech 5. Can the patient actively participate in an intensive  therapy program of at least 3 hrs of therapy per day at least 5 days per week? Yes 6. The potential for patient to make measurable gains while on inpatient rehab is excellent 7. Anticipated functional outcomes upon discharge from inpatients are modified independent mobility PT, modified independent ADLs OT, 100% speech intelligibility SLP 8. Estimated rehab length of stay to reach the above functional goals is: See below 9. Does the patient have adequate social supports to accommodate these discharge functional goals? Yes 10. Anticipated D/C setting: Home 11. Anticipated post D/C treatments: HH  therapy 12. Overall Rehab/Functional Prognosis: excellent  RECOMMENDATIONS: This patient's condition is appropriate for continued rehabilitative care in the following setting: Rehabilitation admission coordinator to followup on physical therapy and occupational therapy. If the patient achieves supervision level in the next one to 2 days and then would be good for outpatient program. If she remains at least a min assist level, then a 7-10 day inpatient rehabilitation program would be appropriate Patient has agreed to participate in recommended program. Yes Note that insurance prior authorization may be required for reimbursement for recommended care.  Comment:   ANGIULLI,DANIEL J. 04/22/2011

## 2011-04-22 NOTE — Progress Notes (Signed)
I will follow up with her functional progress with therapy to assist with determining appropriate rehab venue. CIR vs outpatient rehab. Please call with any questions. Pager [phone]

## 2011-04-22 NOTE — Progress Notes (Signed)
Stroke Team Progress Note  HISTORY Anna Lyons is an 56 y.o. female with new right-sided hemiparesis since about 12:00 pm 04/20/2011 who had NIHSS of 7 on arrival and was a stroke code that had received t-PA. She subsequently went for an emergent angiogram which did not show any large vessel occlusion or stenosis   SUBJECTIVE Daughter at bedside. Pt up in chair. States headache is improved.No new compaints.  OBJECTIVE Filed Vitals:   04/22/11 0500 04/22/11 0600 04/22/11 0700 04/22/11 0741  BP: 155/88 145/86 150/84   Pulse: 57 63 58   Temp:    98.3 F (36.8 C)  TempSrc:    Oral  Resp: 15 14 15    Height:      Weight:      SpO2: 95% 97% 93%    CBG (last 3) No results found for this basename: GLUCAP:3 in the last 72 hours Intake/Output from previous day: 03/20 0701 - 03/21 0700 In: 1900 [P.O.:240; I.V.:1650; IV Piggyback:10] Out: 2545 [Urine:2545]  IV Fluid Intake:     . sodium chloride 75 mL/hr at 04/22/11 0551   Medications    . aspirin EC  325 mg Oral Daily  . hydrochlorothiazide  50 mg Oral Daily  . pantoprazole (PROTONIX) IV  40 mg Intravenous QHS  . rOPINIRole  0.5 mg Oral QHS  . sertraline  50 mg Oral Daily  PRN acetaminophen, acetaminophen, labetalol, ondansetron (ZOFRAN) IV, senna-docusate, traMADol  Diet:  Cardiac Activity:  Bedrest  DVT Prophylaxis:  SCDs   Significant Diagnostic Studies: CBC    Component Value Date/Time   WBC 6.1 04/20/2011 1405   RBC 3.91 04/20/2011 1405   HGB 14.3 04/20/2011 1409   HCT 42.0 04/20/2011 1409   PLT 165 04/20/2011 1405   MCV 98.5 04/20/2011 1405   MCH 35.3* 04/20/2011 1405   MCHC 35.8 04/20/2011 1405   RDW 12.4 04/20/2011 1405   LYMPHSABS 1.8 04/20/2011 1405   MONOABS 0.5 04/20/2011 1405   EOSABS 0.1 04/20/2011 1405   BASOSABS 0.0 04/20/2011 1405   CMP    Component Value Date/Time   NA 138 04/20/2011 1409   K 4.0 04/20/2011 1409   CL 102 04/20/2011 1409   CO2 27 04/20/2011 1405   GLUCOSE 91 04/20/2011 1409   BUN 13 04/20/2011  1409   CREATININE 0.70 04/20/2011 1409   CALCIUM 9.4 04/20/2011 1405   PROT 7.1 04/20/2011 1405   ALBUMIN 3.6 04/20/2011 1405   AST 21 04/20/2011 1405   ALT 11 04/20/2011 1405   ALKPHOS 70 04/20/2011 1405   BILITOT 0.5 04/20/2011 1405   GFRNONAA >90 04/20/2011 1405   GFRAA >90 04/20/2011 1405   COAGS Lab Results  Component Value Date   INR 1.04 04/20/2011   INR 0.92 08/08/2010   Lipid Panel    Component Value Date/Time   CHOL 144 04/21/2011 0400   TRIG 88 04/21/2011 0400   HDL 44 04/21/2011 0400   CHOLHDL 3.3 04/21/2011 0400   VLDL 18 04/21/2011 0400   LDLCALC 82 04/21/2011 0400   HgbA1C  Lab Results  Component Value Date   HGBA1C 5.4 04/21/2011   Urine Drug Screen  No results found for this basename: labopia,  cocainscrnur,  labbenz,  amphetmu,  thcu,  labbarb    Alcohol Level No results found for this basename: eth   Cardiac enzymes neg MRSA neg  CT of the brain  04/20/2011  1.  Stable head CT.  No acute intracranial abnormality identified. 2.  Stable mild subcortical  white matter hypoattenuation, likely reflecting chronic microvascular ischemia. 3.  Stable, remote right putamen lacunar infarct.   Cerebral angio  04/20/2011 S/P Bilateral CCA and Lt Vert artery angiograms, RT CFA approach, No occlusions,dissections stenosis or filling defects seen   MRI of the brain  04/21/2011 1.  Acute non hemorrhagic infarct involving the posterior left lentiform nucleus. 2.  More remote lacunar infarcts are present in the basal ganglia bilaterally. 3.  Mild atrophy and white matter disease.  This likely reflects the sequelae of chronic microvascular ischemia. 4.  Right mastoid effusion.  No obstructing nasopharyngeal lesion is evident.   MRA of the brain  Ordered. Will cancel. See angio  2D Echocardiogram  Done  Carotid Doppler  No internal carotid artery stenosis bilaterally. Vertebrals with antegrade flow bilaterally.   CXR  04/20/2011 Borderline cardiomegaly without decompensation.  EKG   Pending Physical Exam   pleasant elderly Caucasian lady currently not in distress. Afebrile. Head is nontraumatic. Neck is supple without bruit. Hearing is normal. Cardiac exam no murmur or gallop. Lungs are clear to auscultation. Distal pulses are well felt.   Neurological Exam : Awake  Alert oriented x 3. Normal speech and language.eye movements full without nystagmus. Face asymmetric with mild right lower face weakness. Tongue midline. Normal strength, tone, reflexes and coordination on the left. Mild right upper and lower extremity drift  with 4/5 strength. Weakness of right grip and intrinsic hand muscles.. Normal sensation. Gait deferred.   ASSESSMENT Ms. Anna Lyons is a 56 y.o. 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 Not on antiplatelets prior admission. Patient with resultant hemiparesis/ weakness of right face, right arm(s) or right lower leg(s)  -hypertension -cigarette smoker -headache, no relief from tylenol, allergic to codeine  Hospital day # 2  TREATMENT/PLAN -transfer to the floor -rehab consult -OOB. Therapy evals  Joaquin Music, ANP-BC, GNP-BC [facility] Pager: [phone] 04/22/2011 7:55 AM  Dr. Delia Heady, Stroke Center Medical Director, has personally reviewed chart, pertinent data, examined the patient and developed the plan of care.

## 2011-04-22 NOTE — Evaluation (Signed)
Physical Therapy Evaluation Patient Details Name: Anna Lyons MRN: [id number] DOB: [birth year] Today's Date: 04/22/2011  Problem List:  Patient Active Problem List  Diagnoses  . Depression    Past Medical History:  Past Medical History  Diagnosis Date  . Depression   . Hypertension   . Stroke     TIA in 08/2010   Past Surgical History:  Past Surgical History  Procedure Date  . Mastectomy     bilateral with reconstruction in the 80s    PT Assessment/Plan/Recommendation PT Assessment Clinical Impression Statement: Pt is 56 y/o female admitted right sided numbness and weakness and slurred speech.  MRI positive for left stroke.  Pt will benefit from acute PT servcies to improve overall balance, mobility & further strengthening.  Pt will benefit from inpatient rehab prior to d/c home. PT Recommendation/Assessment: Patient will need skilled PT in the acute care venue PT Problem List: Decreased strength;Decreased activity tolerance;Decreased balance;Decreased mobility;Decreased coordination;Decreased knowledge of use of DME;Decreased knowledge of precautions PT Therapy Diagnosis : Difficulty walking;Abnormality of gait;Generalized weakness PT Plan PT Frequency: Min 4X/week PT Treatment/Interventions: DME instruction;Gait training;Functional mobility training;Therapeutic activities;Therapeutic exercise;Balance training;Neuromuscular re-education;Patient/family education PT Recommendation Recommendations for Other Services: Rehab consult Follow Up Recommendations: Inpatient Rehab Equipment Recommended: Defer to next venue PT Goals  Acute Rehab PT Goals PT Goal Formulation: With patient/family Time For Goal Achievement: 2 weeks Pt will go Supine/Side to Sit: with modified independence PT Goal: Supine/Side to Sit - Progress: Goal set today Pt will Sit at Edge of Bed: Independently;1-2 min PT Goal: Sit at Edge Of Bed - Progress: Goal set today Pt will go Sit to Supine/Side: with  modified independence PT Goal: Sit to Supine/Side - Progress: Goal set today Pt will go Sit to Stand: with supervision PT Goal: Sit to Stand - Progress: Goal set today Pt will go Stand to Sit: with supervision PT Goal: Stand to Sit - Progress: Goal set today Pt will Stand: with supervision;3 - 5 min PT Goal: Stand - Progress: Goal set today Pt will Ambulate: >150 feet;with supervision;with least restrictive assistive device PT Goal: Ambulate - Progress: Goal set today  PT Evaluation Precautions/Restrictions  Precautions Precautions: Fall Prior Functioning  Home Living Lives With: Spouse Receives Help From: Family (daughter) Type of Home: House Home Layout: Two level;Able to live on main level with bedroom/bathroom Alternate Level Stairs-Rails: Right Alternate Level Stairs-Number of Steps: 12 Home Access: Ramped entrance Bathroom Shower/Tub: Tub only;Tub/shower unit;Curtain (tub only downstairs. tub/shower upstairs) Bathroom Toilet: Standard Bathroom Accessibility: Yes How Accessible: Accessible via walker Home Adaptive Equipment: Wheelchair - manual;Walker - rolling;Bedside commode/3-in-1;Tub transfer bench Prior Function Level of Independence: Independent with basic ADLs;Independent with gait;Independent with transfers Able to Take Stairs?: Yes Driving: Yes Vocation: Full time employment Vocation Requirements: Conservation officer, nature at [employer] Arousal/Alertness: Awake/alert Overall Cognitive Status: Appears within functional limits for tasks assessed Orientation Level: Oriented X4 Sensation/Coordination Sensation Light Touch: Appears Intact Proprioception: Appears Intact Coordination Gross Motor Movements are Fluid and Coordinated: No Fine Motor Movements are Fluid and Coordinated: No Coordination and Movement Description: Pt with questionable motor planning deficits. Heel Shin Test: Pt right LE seems mildly different than LLE Extremity Assessment RLE  Assessment RLE Assessment: Exceptions to [facility] (gross WFL except 4/5) RLE Strength Right Hip Flexion: 4/5 LLE Assessment LLE Assessment: Within Functional Limits Mobility (including Balance) Bed Mobility Bed Mobility: Yes Supine to Sit: 4: Min assist (minguard) Supine to Sit Details (indicate cue type and reason): MInguard (A) for safety with cues  for technique.  Pt needed extra time to complete bed mobility task. Transfers Transfers: Yes Sit to Stand: 1: +2 Total assist;Patient percentage (comment);From bed (pt 70%) Sit to Stand Details (indicate cue type and reason): +2 assist to maintain balance during transfer with cues for hand placement.  Pt with initial RLE knee buckling.   Stand to Sit: 4: Min assist;To chair/3-in-1 Stand to Sit Details: (A) to slowly descend to recliner with cues for hand placement Ambulation/Gait Ambulation/Gait: Yes Ambulation/Gait Assistance: 1: +2 Total assist;Patient percentage (comment) (pt 70%) Ambulation/Gait Assistance Details (indicate cue type and reason): (A) to maintain balance with HHA x 2.  Pt with narrow base of support and occasional crossing RLE across LLE especially with turns. Ambulation Distance (Feet): 15 Feet Assistive device: 2 person hand held assist Gait Pattern: Step-through pattern;Shuffle;Decreased step length - right;Decreased step length - left Stairs: No Wheelchair Mobility Wheelchair Mobility: No  Posture/Postural Control Posture/Postural Control: No significant limitations Balance Balance Assessed: Yes Static Sitting Balance Static Sitting - Balance Support: Feet supported Static Sitting - Level of Assistance: 4: Min assist (minguard) Static Sitting - Comment/# of Minutes: Minguard (A) for safety Exercise    End of Session PT - End of Session Equipment Utilized During Treatment: Gait belt Activity Tolerance: Patient tolerated treatment well;Patient limited by fatigue Patient left: in chair;with family/visitor  present;with call bell in reach (daughter) Nurse Communication: Mobility status for transfers;Mobility status for ambulation General Behavior During Session: [facility] for tasks performed Cognition: [facility] for tasks performed  Anna Lyons 04/22/2011, 10:44 AM Jake Shark, PT DPT [phone]

## 2011-04-23 ENCOUNTER — Inpatient Hospital Stay (HOSPITAL_COMMUNITY)
Admission: RE | Admit: 2011-04-23 | Discharge: 2011-04-30 | DRG: 945 | Disposition: A | Discharge status: Home / Self Care | Payer: MEDICAID | Source: Ambulatory Visit | Attending: Physical Medicine & Rehabilitation | Admitting: Physical Medicine & Rehabilitation

## 2011-04-23 DIAGNOSIS — I6350 Cerebral infarction due to unspecified occlusion or stenosis of unspecified cerebral artery: Secondary | ICD-10-CM

## 2011-04-23 DIAGNOSIS — R29898 Other symptoms and signs involving the musculoskeletal system: Secondary | ICD-10-CM

## 2011-04-23 DIAGNOSIS — R471 Dysarthria and anarthria: Secondary | ICD-10-CM

## 2011-04-23 DIAGNOSIS — M7520 Bicipital tendinitis, unspecified shoulder: Secondary | ICD-10-CM

## 2011-04-23 DIAGNOSIS — I639 Cerebral infarction, unspecified: Secondary | ICD-10-CM

## 2011-04-23 DIAGNOSIS — G2581 Restless legs syndrome: Secondary | ICD-10-CM

## 2011-04-23 DIAGNOSIS — M7750 Other enthesopathy of unspecified foot: Secondary | ICD-10-CM

## 2011-04-23 DIAGNOSIS — Z7982 Long term (current) use of aspirin: Secondary | ICD-10-CM

## 2011-04-23 DIAGNOSIS — R29810 Facial weakness: Secondary | ICD-10-CM

## 2011-04-23 DIAGNOSIS — I10 Essential (primary) hypertension: Secondary | ICD-10-CM

## 2011-04-23 DIAGNOSIS — F3289 Other specified depressive episodes: Secondary | ICD-10-CM

## 2011-04-23 DIAGNOSIS — F329 Major depressive disorder, single episode, unspecified: Secondary | ICD-10-CM

## 2011-04-23 DIAGNOSIS — F17200 Nicotine dependence, unspecified, uncomplicated: Secondary | ICD-10-CM

## 2011-04-23 DIAGNOSIS — Z5189 Encounter for other specified aftercare: Principal | ICD-10-CM

## 2011-04-23 DIAGNOSIS — Z8673 Personal history of transient ischemic attack (TIA), and cerebral infarction without residual deficits: Secondary | ICD-10-CM

## 2011-04-23 DIAGNOSIS — I6330 Cerebral infarction due to thrombosis of unspecified cerebral artery: Secondary | ICD-10-CM

## 2011-04-23 DIAGNOSIS — K5900 Constipation, unspecified: Secondary | ICD-10-CM

## 2011-04-23 MED ORDER — SORBITOL 70 % SOLN: 30.0000 mL | Freq: Every day | Status: DC | PRN

## 2011-04-23 MED ORDER — SERTRALINE HCL 50 MG PO TABS
50.0000 mg | ORAL_TABLET | Freq: Every day | ORAL | Status: DC
  Administered 2011-04-24 – 2011-04-30 (×7): 50 mg via ORAL
  Filled 2011-04-23 (×9): qty 1

## 2011-04-23 MED ORDER — POLYETHYLENE GLYCOL 3350 17 G PO PACK
17.0000 g | PACK | Freq: Every day | ORAL | Status: DC | PRN
  Filled 2011-04-23: qty 1

## 2011-04-23 MED ORDER — TRAMADOL HCL 50 MG PO TABS
100.0000 mg | ORAL_TABLET | Freq: Four times a day (QID) | ORAL | Status: DC | PRN
  Administered 2011-04-23 – 2011-04-27 (×2): 100 mg via ORAL
  Filled 2011-04-23 (×2): qty 2

## 2011-04-23 MED ORDER — ASPIRIN EC 325 MG PO TBEC
325.0000 mg | DELAYED_RELEASE_TABLET | Freq: Every day | ORAL | Status: DC
  Administered 2011-04-24 – 2011-04-30 (×7): 325 mg via ORAL
  Filled 2011-04-23 (×9): qty 1

## 2011-04-23 MED ORDER — PANTOPRAZOLE SODIUM 40 MG PO TBEC
40.0000 mg | DELAYED_RELEASE_TABLET | Freq: Every day | ORAL | Status: DC
  Administered 2011-04-23: 40 mg via ORAL
  Filled 2011-04-23: qty 1

## 2011-04-23 MED ORDER — HYDROCHLOROTHIAZIDE 50 MG PO TABS
50.0000 mg | ORAL_TABLET | Freq: Every day | ORAL | Status: DC
  Administered 2011-04-24 – 2011-04-26 (×3): 50 mg via ORAL
  Filled 2011-04-23 (×6): qty 1

## 2011-04-23 MED ORDER — ACETAMINOPHEN 325 MG PO TABS: 325.0000 mg | ORAL_TABLET | ORAL | Status: DC | PRN

## 2011-04-23 NOTE — Discharge Summary (Signed)
Stroke Discharge Summary  Patient ID: Anna Lyons   MRN: [id number]      DOB: September 21, 1955  Date of Admission: 04/20/2011 Date of Discharge: 04/23/2011  Attending Physician:  Darcella Cheshire, MD  Consulting Physician(s):    Julieanne Cotton, MD (INR), Faith Rogue, MD (PM&R)   Patient's PCP:  No primary provider on file.  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  -resultant hemiparesis/ weakness of right face, right arm(s) or right lower leg(s)  -hypertension  -cigarette smoker  -headache -depression  BMI  Body mass index is 23.74 kg/(m^2).  Past Medical History  Diagnosis Date  . Depression   . Hypertension   . Stroke     TIA in 08/2010   Past Surgical History  Procedure Date  . Mastectomy     bilateral with reconstruction in the 80s   Medications to be continued on Rehab   . aspirin EC  325 mg Oral Daily  . hydrochlorothiazide  50 mg Oral Daily  . pantoprazole (PROTONIX) IV  40 mg Intravenous QHS  . rOPINIRole  0.5 mg Oral QHS  . sertraline  50 mg Oral Daily   LABORATORY STUDIES CBC    Component Value Date/Time   WBC 6.1 04/20/2011 1405   RBC 3.91 04/20/2011 1405   HGB 14.3 04/20/2011 1409   HCT 42.0 04/20/2011 1409   PLT 165 04/20/2011 1405   MCV 98.5 04/20/2011 1405   MCH 35.3* 04/20/2011 1405   MCHC 35.8 04/20/2011 1405   RDW 12.4 04/20/2011 1405   LYMPHSABS 1.8 04/20/2011 1405   MONOABS 0.5 04/20/2011 1405   EOSABS 0.1 04/20/2011 1405   BASOSABS 0.0 04/20/2011 1405   CMP    Component Value Date/Time   NA 138 04/20/2011 1409   K 4.0 04/20/2011 1409   CL 102 04/20/2011 1409   CO2 27 04/20/2011 1405   GLUCOSE 91 04/20/2011 1409   BUN 13 04/20/2011 1409   CREATININE 0.70 04/20/2011 1409   CALCIUM 9.4 04/20/2011 1405   PROT 7.1 04/20/2011 1405   ALBUMIN 3.6 04/20/2011 1405   AST 21 04/20/2011 1405   ALT 11 04/20/2011 1405   ALKPHOS 70 04/20/2011 1405   BILITOT 0.5  04/20/2011 1405   GFRNONAA >90 04/20/2011 1405   GFRAA >90 04/20/2011 1405   COAGS Lab Results  Component Value Date   INR 1.04 04/20/2011   INR 0.92 08/08/2010   Lipid Panel    Component Value Date/Time   CHOL 144 04/21/2011 0400   TRIG 88 04/21/2011 0400   HDL 44 04/21/2011 0400   CHOLHDL 3.3 04/21/2011 0400   VLDL 18 04/21/2011 0400   LDLCALC 82 04/21/2011 0400   HgbA1C  Lab Results  Component Value Date   HGBA1C 5.4 04/21/2011   Urine Drug Screen  No results found for this basename: labopia, cocainscrnur, labbenz, amphetmu, thcu, labbarb    Alcohol Level No results found for this basename: eth   Cardiac enzymes neg  MRSA neg  SIGNIFICANT DIAGNOSTIC STUDIES CT of the brain 04/20/2011 1. Stable head CT. No acute intracranial abnormality identified. 2. Stable mild subcortical white matter hypoattenuation, likely reflecting chronic microvascular ischemia. 3. Stable, remote right putamen lacunar infarct.  Cerebral angio 04/20/2011 S/P Bilateral CCA and Lt Vert artery angiograms, RT CFA approach, No occlusions,dissections stenosis or filling defects seen  MRI of the brain 04/21/2011 1. Acute non hemorrhagic infarct involving the  posterior left lentiform nucleus. 2. More remote lacunar infarcts are present in the basal ganglia bilaterally. 3. Mild atrophy and white matter disease. This likely reflects the sequelae of chronic microvascular ischemia. 4. Right mastoid effusion. No obstructing nasopharyngeal lesion is evident.  MRA of the brain Ordered. Will cancel. See angio  2D Echocardiogram EF 55-60% with no source of embolus.  Carotid Doppler No internal carotid artery stenosis bilaterally. Vertebrals with antegrade flow bilaterally.  CXR 04/20/2011 Borderline cardiomegaly without decompensation.  EKG atrial paced complexes, no pacer per pt hx  History of Present Illness  Anna Lyons is an 56 y.o. female with new right-sided hemiparesis since about 12:00 pm 04/20/2011 who had NIHSS of 7 on  arrival and was a stroke code that had received t-PA. She subsequently went for an emergent angiogram which did not show any large vessel occlusion or stenosis   Hospital Course  Pt tolerated tpa without difficulty. Post tpa imaging was negative for hemorrhage. Patient was not on antiplatelets prior to admission. She was started on ASA 325 mg daily for secondary stroke prevention. She did complain of a headache secondary to stroke; resolved with ultram prn. Her stroke was found to be secondary to small vessel disease from long standing hypertension and cigarette smoking. BP is controlled in hospital. She is advised and agreeable to stop smoking.  Physical therapy, occupational therapy and speech therapy evaluated patient. All agreed inpatient rehab is needed. Bed is available and pt will be transferred there today.  Discharge Exam  Blood pressure 120/76, pulse 59, temperature 98.4 F (36.9 C), temperature source Oral, resp. rate 20, height 5\' 5"  (1.651 m), weight 64.7 kg (142 lb 10.2 oz), SpO2 97.00%. Physical Exam pleasant elderly Caucasian lady currently not in distress. Afebrile. Head is nontraumatic. Neck is supple without bruit. Hearing is normal. Cardiac exam no murmur or gallop. Lungs are clear to auscultation. Distal pulses are well felt.  Neurological Exam : Awake Alert oriented x 3. Normal speech and language.eye movements full without nystagmus. Face asymmetric with mild right lower face weakness. Tongue midline.  Normal strength, tone, reflexes and coordination on the left. Mild right upper and lower extremity drift with 4/5 strength. Weakness of right grip and intrinsic hand muscles.. Normal sensation. Gait deferred.   Discharge Diet  Cardiac thin liquids  Discharge Plan - Disposition:  Transfer to [facility] for ongoing PT, OT and ST - aspirin 325 mg orally every day for secondary stroke prevention. - Ongoing risk factor control by Primary Care Physician. - Risk factor  recommendations:  Hypertension target range 130-140/70-80 Smoking cessation  - Follow-up primary provider in 1 month. - Follow-up with Dr. Delia Heady in 2 months.  Signed Annie Main, AVNP, ANP-BC, [facility] Nurse Practitioner 04/23/2011, 11:46 AM  Dr. Delia Heady, Stroke Center Medical Director, has personally reviewed chart, pertinent data, examined the patient and developed the plan of care.

## 2011-04-23 NOTE — H&P (Signed)
Physical Medicine and Rehabilitation Admission H&P  Anna Lyons is an 56 y.o. female.  Chief Complaint   Patient presents with   .  Code Stroke   :  HPI: 56 year old right-handed female with history of TIA in July 2012 as well as hypertension and tobacco abuse admitted March 19 with right-sided weakness and slurred speech. 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 Carotid Dopplers with no internal carotid artery stenosis. Echocardiogram with ejection fraction of 60% without wall motion abnormalities and negative for emboli. Cerebral angiogram with no occlusions or dissection noted. The patient did receive TPA. Neurology services consulted placed on aspirin therapy. Speech and occupational therapy treatments ongoing with recommendations for inpatient rehabilitation services as well as M.D. request physical medicine rehabilitation consult. Rehab felt the patient would benefit from CIR level therapies. Review of Systems  Gastrointestinal: Positive for constipation.  Neurological: Positive for dizziness and headaches.  Depression  All other systems reviewed and are negative  Past Medical History   Diagnosis  Date   .  Depression    .  Hypertension    .  Stroke      TIA in 08/2010    Past Surgical History   Procedure  Date   .  Mastectomy      bilateral with reconstruction in the 80s    History reviewed. No pertinent family history.  Social History: reports that she has been smoking Cigarettes. She has a 5 pack-year smoking history. She does not have any smokeless tobacco history on file. She reports that she drinks about .6 ounces of alcohol per week. She reports that she does not use illicit drugs.  Allergies:  Allergies   Allergen  Reactions   .  Codeine  Itching    Medications Prior to Admission   Medication  Dose  Route  Frequency  Provider  Last Rate  Last Dose     .  0.9 % sodium chloride infusion  250 mL  Intravenous  Once  Dione Booze, MD  250 mL/hr at 04/20/11 1449  250 mL at 04/20/11 1449   .  0.9 % sodium chloride infusion   Intravenous  Continuous  Oneal Grout, MD  75 mL/hr at 04/21/11 0125    .  acetaminophen (TYLENOL) tablet 650 mg  650 mg  Oral  Q4H PRN  Carmell Austria, MD   650 mg at 04/21/11 1450    Or   .  acetaminophen (TYLENOL) suppository 650 mg  650 mg  Rectal  Q4H PRN  Carmell Austria, MD     .  alteplase (ACTIVASE) 1 mg/mL infusion 64 mg  0.9 mg/kg (Order-Specific)  Intravenous  Once  Carmell Austria, MD   64 mg at 04/20/11 1446   .  aspirin EC tablet 325 mg  325 mg  Oral  Daily  Thana Farr, MD   325 mg at 04/23/11 1100   .  hydrochlorothiazide (HYDRODIURIL) tablet 50 mg  50 mg  Oral  Daily  Layne Benton, NP   50 mg at 04/23/11 1100   .  iohexol (OMNIPAQUE) 300 MG/ML solution 150 mL  150 mL  Intra-arterial  Once PRN  Oneal Grout, MD   50 mL at 04/20/11 1542   .  pantoprazole (PROTONIX) injection 40 mg  40 mg  Intravenous  QHS  Carmell Austria, MD   40 mg at 04/22/11 2303   .  rOPINIRole (REQUIP) tablet 0.5 mg  0.5 mg  Oral  Once  Noel Christmas   0.5 mg at 04/21/11 0018   .  rOPINIRole (REQUIP) tablet 0.5 mg  0.5 mg  Oral  QHS  Charles Stewart   0.5 mg at 04/22/11 2245   .  senna-docusate (Senokot-S) tablet 1 tablet  1 tablet  Oral  QHS PRN  Carmell Austria, MD     .  sertraline (ZOLOFT) tablet 50 mg  50 mg  Oral  Daily  Layne Benton, NP   50 mg at 04/23/11 1100   .  traMADol (ULTRAM) tablet 100 mg  100 mg  Oral  Q6H PRN  Layne Benton, NP   100 mg at 04/22/11 2303   .  DISCONTD: 0.9 % sodium chloride infusion   Intravenous  Continuous  Carmell Austria, MD     .  DISCONTD: alteplase (ACTIVASE) 1 mg/mL infusion 0.9 mg/kg  0.9 mg/kg  Intravenous  Once  Dione Booze, MD     .  DISCONTD: labetalol (NORMODYNE,TRANDATE) injection 10 mg  10 mg  Intravenous  Q10 min PRN  Carmell Austria, MD     .  DISCONTD: ondansetron [facility]) injection 4 mg  4 mg   Intravenous  Q6H PRN  Carmell Austria, MD      No current outpatient prescriptions on file as of 04/23/2011.    Home:  Home Living  Lives With: Spouse  Receives Help From: Family (daughter)  Type of Home: House  Home Layout: Two level;Able to live on main level with bedroom/bathroom  Alternate Level Stairs-Rails: Right  Alternate Level Stairs-Number of Steps: 12  Home Access: Ramped entrance  Bathroom Shower/Tub: Tub only;Tub/shower unit;Curtain (tub only downstairs, tub/shower upstairs)  Bathroom Toilet: Standard  Bathroom Accessibility: Yes  How Accessible: Accessible via walker  Home Adaptive Equipment: Wheelchair - manual;Walker - rolling;Bedside commode/3-in-1;Tub transfer bench  Functional History:  Prior Function  Level of Independence: Independent with basic ADLs;Independent with gait;Independent with transfers  Able to Take Stairs?: Yes  Driving: Yes  Vocation: Full time employment  Vocation Requirements: Conservation officer, nature at [employer] Status:  Mobility:  Bed Mobility  Bed Mobility: Yes  Supine to Sit: 4: Min assist;HOB flat  Supine to Sit Details (indicate cue type and reason): focus on sequencing and positioning of right UE and LE for increased functional independence  Sitting - Scoot to [place] of Bed: 4: Min assist  Sitting - Scoot to [place] of Bed Details (indicate cue type and reason): decreased dissociation of trunk and pelvis with decreased grading of lateral weight shifts resulting in frequenct posterior right LOB  Sit to Supine: 4: Min assist;HOB flat  Sit to Supine - Details (indicate cue type and reason): focus on sequencing, verbal cues for attention to right UE and positioning right UE and LE for increased functional independence  Transfers  Transfers: Yes  Sit to Stand: 3: Mod assist;With upper extremity assist;From bed  Sit to Stand Details (indicate cue type and reason): Manual facilitation for anterior weight right shift for symmetry and right LE  placement. Posterior left lean with decreased weight bearing through right LE.  Stand to Sit: 3: Mod assist;With upper extremity assist;To bed  Stand to Sit Details: Posterior lean and decreased bilat LE eccentric control resulting in poorly controlled descent witout physical assist  Ambulation/Gait  Ambulation/Gait: Yes  Ambulation/Gait Assistance: 3: Mod assist  Ambulation/Gait Assistance Details (indicate cue type and reason): right hand held assist, manual facilitation at pelvis  for increased grading of right lateral weight shift, increased right hip extension and trunk elongation in stance phase, and progression of center of gravity over BOS in right stance phase. Right LE fatigues quickly with intermittent buckling.  Ambulation Distance (Feet): 35 Feet  Assistive device: 1 person hand held assist  Gait Pattern: (decreased right foot clearance with intermittent step to)  Stairs: No  Wheelchair Mobility  Wheelchair Mobility: No  ADL:  ADL  Eating/Feeding: Performed;Set up  Eating/Feeding Details (indicate cue type and reason): setup to cut food and open containers  Where Assessed - Eating/Feeding: Bed level  Lower Body Dressing: Performed;Set up  Lower Body Dressing Details (indicate cue type and reason): Pt donne bil. slip on shoes using bil. UE and ankles crossed over legs.  Where Assessed - Lower Body Dressing: Sitting, bed  Toilet Transfer: Simulated;+2 Total assistance;Comment for patient % (70%)  Toilet Transfer Details (indicate cue type and reason): bil. HHA for steadying and balance. bed to chair.  Toilet Transfer Method: Engineer, drilling: Other (comment) (chair)  Equipment Used: Other (comment) (bil. HHA)  Ambulation Related to ADLs: Pt with shuffled gait and Rt. knee buckling  ADL Comments: Pt encourage to incorporate use of Rt. UE during ADL activity. Anticipate pt will require min assist with UB ADLs due to decreased functional use of Rt. UE.    Cognition:  Cognition  Overall Cognitive Status: Appears within functional limits for tasks assessed  Arousal/Alertness: Awake/alert  Orientation Level: Oriented X4  Attention: Selective;Alternating  Selective Attention: Appears intact  Alternating Attention: Appears intact  Memory: Appears intact  Awareness: Appears intact  Problem Solving: Appears intact  Executive Function: Reasoning;Decision Making;Initiating;Self Monitoring  Reasoning: Appears intact  Decision Making: Appears intact  Initiating: Appears intact  Self Monitoring: Appears intact  Safety/Judgment: Appears intact  Cognition  Arousal/Alertness: Awake/alert  Overall Cognitive Status: Appears within functional limits for tasks assessed  Orientation Level: Oriented X4  Blood pressure 120/76, pulse 59, temperature 98.4 F (36.9 C), temperature source Oral, resp. rate 20, height 5\' 5"  (1.651 m), weight 64.7 kg (142 lb 10.2 oz), SpO2 97.00%.  Physical Exam  Vitals reviewed.  Constitutional: She is oriented to person, place, and time. She appears well-developed.  HENT:  Head: Normocephalic. PERRL, EOMI Neck: Normal range of motion. Neck supple. No thyromegaly present.  Cardiovascular: Normal rate.  Pulmonary/Chest: Breath sounds normal. She has no wheezes.  Abdominal: She exhibits no distension. There is no tenderness.  Musculoskeletal: She exhibits no edema.  Neurological: She is alert and oriented to person, place, and time.  Dysarthric speech but fully intelligible with right facial droop and tongue deviation Skin: Skin is warm and dry.  Psychiatric: She has a normal mood and affect.  NIH stroke scale is 4  Motor strength is 2 minus at the right deltoid. biceps, triceps, grip are near 3-4/5 with delayed activation. She does have underlying tone at 1+ in the right wrist flexors and elbow.  5/5 on the left side in the upper extremity.  Motor strength is 4/5 in the right hip flexor, knee extensors, ankle dorsiflexor  and 5/5 in these muscles on the left  Sensation is intact to light touch. She is hyperreflexic at 3+ in the RUE and RLE. There is no evidence of tactile or visual extinction  Visual fields are intact confrontation testing  Cerebellar testing shows no evidence of ataxia  Cognitively she's intact with good insight and awareness. She has normal memory.    No results found  for this or any previous visit (from the past 48 hour(s)).  Mr Brain Wo Contrast  04/21/2011 *RADIOLOGY REPORT* Clinical Data: Stroke. Status post t-PA. MRI HEAD WITHOUT CONTRAST Technique: Multiplanar, multiecho pulse sequences of the brain and surrounding structures were obtained according to standard protocol without intravenous contrast. Comparison: CT head without contrast 04/20/2011. Findings: An acute non hemorrhagic infarct is present within the left lentiform nucleus. There are more remote lacunar infarcts immediately adjacent to this area as well as a remote lacunar infarct in the right lentiform nucleus. Mild periventricular and scattered subcortical T2 and FLAIR hyperintensities otherwise are noted. Flow is present in the major intracranial arteries. No mass lesion is present. No focal hemorrhage is evident. The paranasal sinuses and left mastoid air cells are clear. There is fluid in the right mastoid air cells. No obstructing nasopharyngeal lesion is evident. IMPRESSION: 1. Acute non hemorrhagic infarct involving the posterior left lentiform nucleus. 2. More remote lacunar infarcts are present in the basal ganglia bilaterally. 3. Mild atrophy and white matter disease. This likely reflects the sequelae of chronic microvascular ischemia. 4. Right mastoid effusion. No obstructing nasopharyngeal lesion is evident. Original Report Authenticated By: Jamesetta Orleans. MATTERN, M.D.   Post Admission Physician Evaluation:  1. Functional deficits secondary To a subcortical left brain infarct. 2. Patient is admitted to receive  collaborative, interdisciplinary care between the physiatrist, rehab nursing staff, and therapy team. 3. Patient's level of medical complexity and substantial therapy needs in context of that medical necessity cannot be provided at a lesser intensity of care such as a SNF. 4. Patient has experienced substantial functional loss from his/her baseline which was documented above under the "Functional History" and "Functional Status" headings. Judging by the patient's diagnosis, physical exam, and functional history, the patient has potential for functional progress which will result in measurable gains while on inpatient rehab. These gains will be of substantial and practical use upon discharge in facilitating mobility and self-care at the household level. 5. Physiatrist will provide 24 hour management of medical needs as well as oversight of the therapy plan/treatment and provide guidance as appropriate regarding the interaction of the two. 6. 24 hour rehab nursing will assist with bladder management, bowel management, safety, skin/wound care, disease management, medication administration, pain management and patient education and help integrate therapy concepts, techniques,education, etc. 7. PT will assess and treat for: Lower extremity strength, balance, NMR, safety, adaptive equipment training. Goals are: modified independent. 8. OT will assess and treat for: UES, ADL's, functional mobility, safety, adaptive equipment training, family ed. Goals are: modified independent to supervision. 9. SLP will assess and treat for:not applicable 10. Case Management and Social Worker will assess and treat for psychological issues and discharge planning. 11. Team conference will be held weekly to assess progress toward goals and to determine barriers to discharge. 12. Patient will receive at least 3 hours of therapy per day at least 5 days per week. 13. ELOS and Prognosis: 7-10 days excellent  Medical Problem List and  Plan:  1. left subcortical infarction  -pt has early hypertonia in the RUE. Work on ROM for now with therapies and during down time. She has reasonable underlying strength. 2. DVT Prophylaxis/Anticoagulation: SCDs. Monitor for any signs of deep vein thrombosis  3. Hypertension. Hydrochlorothiazide 50 mg daily. Monitor with increased mobility. Watch weights, i's and o's with diuretic on board. 4. Depression. Zoloft. Provide emotional support and positive reinforcement. Seems to be up beat and has a supportive family network. 5. Tobacco abuse.  Consult to tobacco cessation counselor obtained while on acute care services   Ivory Broad, MD 04/23/11 6. Pain management. Ultram as needed for intermittent headaches

## 2011-04-23 NOTE — Discharge Instructions (Signed)
STROKE/TIA DISCHARGE INSTRUCTIONS SMOKING Cigarette smoking nearly doubles your risk of having a stroke & is the single most alterable risk factor  If you smoke or have smoked in the last 12 months, you are advised to quit smoking for your health.  Most of the excess cardiovascular risk related to smoking disappears within a year of stopping.  Ask you doctor about anti-smoking medications  [place] Quit Line: 1-800-QUIT NOW  Free Smoking Cessation Classes [phone]  CHOLESTEROL Know your levels; limit fat & cholesterol in your diet  Lipid Panel     Component Value Date/Time   CHOL 144 04/21/2011 0400   TRIG 88 04/21/2011 0400   HDL 44 04/21/2011 0400   CHOLHDL 3.3 04/21/2011 0400   VLDL 18 04/21/2011 0400   LDLCALC 82 04/21/2011 0400      Many patients benefit from treatment even if their cholesterol is at goal.  Goal: Total Cholesterol (CHOL) less than 160  Goal:  Triglycerides (TRIG) less than 150  Goal:  HDL greater than 40  Goal:  LDL (LDLCALC) less than 100   BLOOD PRESSURE American Stroke Association blood pressure target is less that 120/80 mm/Hg  Your discharge blood pressure is:  BP: 120/76 mmHg  Monitor your blood pressure  Limit your salt and alcohol intake  Many individuals will require more than one medication for high blood pressure  DIABETES (A1c is a blood sugar average for last 3 months) Goal HGBA1c is under 7% (HBGA1c is blood sugar average for last 3 months)  Diabetes: No known diagnosis of diabetes    Lab Results  Component Value Date   HGBA1C 5.4 04/21/2011     Your HGBA1c can be lowered with medications, healthy diet, and exercise.  Check your blood sugar as directed by your physician  Call your physician if you experience unexplained or low blood sugars.  PHYSICAL ACTIVITY/REHABILITATION Goal is 30 minutes at least 4 days per week    Activity:   Increase activity slowly,, No driving, and Walk with assistance, Therapies:   Physical Therapy:  Inpatient Rehab Unit at [facility], Occupational Therapy: Inpatient Rehab Unit at [facility] and Speech Therapy: Inpatient Rehab Unit at [facility] Return to work:  TBD  Activity decreases your risk of heart attack and stroke and makes your heart stronger.  It helps control your weight and blood pressure; helps you relax and can improve your mood.  Participate in a regular exercise program.  Talk with your doctor about the best form of exercise for you (dancing, walking, swimming, cycling).  DIET/WEIGHT Goal is to maintain a healthy weight  Your discharge diet is: Cardiac thin liquids Your height is:  Height: 5\' 5"  (165.1 cm) Your current weight is: Weight: 64.7 kg (142 lb 10.2 oz) Your Body Mass Index (BMI) is:  BMI (Calculated): 23.8   Following the type of diet specifically designed for you will help prevent another stroke.  Your goal weight range is:  ***  Your goal Body Mass Index (BMI) is 19-24.  Healthy food habits can help reduce 3 risk factors for stroke:  High cholesterol, hypertension, and excess weight.  RESOURCES Stroke/Support Group:  Call [phone]  they meet the 3rd Sunday of the month on the Rehab Unit at [facility], [place] ( no meetings June, July & Aug).  STROKE EDUCATION PROVIDED/REVIEWED AND GIVEN TO PATIENT Stroke warning signs and symptoms How to activate emergency medical system (call 911). Medications prescribed at discharge. Need for follow-up after discharge. Personal risk factors  for stroke. Pneumonia vaccine given:   {STROKE DC YES/NO/DATE:22363} Flu vaccine given:   {STROKE DC YES/NO/DATE:22363} My questions have been answered, the writing is legible, and I understand these instructions.  I will adhere to these goals & educational materials that have been provided to me after my discharge from the hospital.

## 2011-04-23 NOTE — Evaluation (Signed)
Physical Therapy Assessment and Plan  Patient Details  Name: Anna Lyons MRN: [id number] Date of Birth: February 26, 1955  PT Diagnosis: Abnormal posture, Abnormality of gait, Hemiplegia dominant and Muscle weakness Rehab Potential: Excellent ELOS: 7-10 days   Today's Date: 04/23/2011 Time: 4098-1191 Time Calculation (min): 50 min  Problem List:  Patient Active Problem List  Diagnoses  . Depression  . CVA (cerebral infarction)    Past Medical History:  Past Medical History  Diagnosis Date  . Depression   . Hypertension   . Stroke     TIA in 08/2010   Past Surgical History:  Past Surgical History  Procedure Date  . Mastectomy     bilateral with reconstruction in the 80s    Assessment & Plan Clinical Impression: Patient is a 56 y.o right-handed female with history of TIA in July 2012, hypertension, and tobacco abuse admitted 04/20/11 with right-sided weakness and slurred speech. 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 Carotid Dopplers with no internal carotid artery stenosis. Echocardiogram with ejection fraction of 60% without wall motion abnormalities and negative for emboli. Cerebral angiogram with no occlusions or dissection noted. The patient did receive TPA. Patient admitted to [facility] 04/23/11 for continued therapies prior to d/c home.  PTA patient lived with spouse in 2-level home with ramp to enter. Patient was independent with all mobility, ADLs, and IADLs, provided intermittent care for husband who had CVA 1.5 years ago, and worked full time as Conservation officer, nature at [employer]. Patient currently requires mod-max assist with mobility secondary to muscle weakness, impaired timing and sequencing and unbalanced muscle activation, decreased dynamic sitting balance, decreased static and dynamic standing balance, decreased postural control, dominant right side hemiplegia,  decreased balance strategies with frequent posterior right LOB, decreased right-side muscular endurance with intermittent right knee buckling, BERG balance assessment completed in acute care this morning with score of 19/56 = 100% risk of fall, and overall decreased activity tolerance resulting in decreased functional independence and increased risk of fall.   Patient will benefit from skilled PT intervention to maximize safe functional mobility, minimize fall risk and decrease caregiver burden for planned discharge home with intermittent assist.  Anticipate patient will benefit from follow up OPPT at discharge.  PT - End of Session Activity Tolerance: Tolerates 30+ min activity with multiple rests PT Assessment Rehab Potential: Excellent Barriers to Discharge: None PT Plan PT Frequency: 1-2 X/day, 60-90 minutes;5 out of 7 days Estimated Length of Stay: 7-10 days PT Treatment/Interventions: Ambulation/gait training;Balance/vestibular training;Community reintegration;Discharge planning;Disease management/prevention;DME/adaptive equipment instruction;Functional electrical stimulation;Functional mobility training;Neuromuscular re-education;Patient/family education;Psychosocial support;Splinting/orthotics;Stair training;Therapeutic Activities;Therapeutic Exercise;UE/LE Strength taining/ROM;Wheelchair propulsion/positioning PT Recommendation Follow Up Recommendations: Outpatient PT Equipment Details: TBD as patient progresses, may need RW  PT Evaluation Precautions/Restrictions Precautions Precautions: Fall Required Braces or Orthoses: No Restrictions Weight Bearing Restrictions: No Other Position/Activity Restrictions: dominant right side hemiplegia General Chart reviewed, spouse and dtr present Vital Signs Therapy Vitals BP: 121/77 mmHg Patient Position, if appropriate: Lying Oxygen Therapy SpO2: 94 % O2 Device: None (Room air) Pain Pain Assessment Pain Assessment: No/denies  pain Pain Score: 0-No pain Home Living/Prior Functioning Home Living Lives With: Spouse Receives Help From: Family Type of Home: House Home Layout: Two level;Able to live on main level with bedroom/bathroom Alternate Level Stairs-Rails: Right Alternate Level Stairs-Number of Steps: full flight Home Access: Ramped entrance Bathroom Shower/Tub: Tub only;Tub/shower unit;Curtain (tub only downstairs, tub/shower unit upstairs) Bathroom Toilet: Standard Bathroom Accessibility: Yes  How Accessible: Accessible via walker Home Adaptive Equipment: Bedside commode/3-in-1;Straight cane;Tub transfer bench;Wheelchair - manual (husband's w/c) Prior Function Level of Independence: Independent with basic ADLs;Independent with homemaking with ambulation;Independent with gait;Independent with transfers Able to Take Stairs?: Yes Driving: Yes 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[employer]) Leisure: Hobbies-yes (Comment) Comments: enjoys spending time with her 24-year-old grandson Vision/Perception  Vision - History Patient Visual Report: No change from baseline Vision - Assessment Eye Alignment: Within Functional Limits Perception Perception: Within Functional Limits  Cognition Overall Cognitive Status: Appears within functional limits for tasks assessed Arousal/Alertness: Awake/alert Orientation Level: Oriented X4 Selective Attention: Appears intact Alternating Attention: Appears intact Memory: Appears intact Awareness: Appears intact Problem Solving: Appears intact Reasoning: Appears intact Decision Making: Appears intact Initiating: Appears intact Self Monitoring: Appears intact Safety/Judgment: Appears intact Sensation Sensation Light Touch: Appears Intact Stereognosis: Not tested Hot/Cold: Not tested Proprioception: Appears Intact Coordination Gross Motor Movements are Fluid and Coordinated: No Fine Motor Movements are Fluid and Coordinated: No Coordination and Movement  Description: decreased grading and accuracy right UE and LE movement in open chain Heel Shin Test: decreased AROM to fully assess Motor  Motor Motor: Hemiplegia;Abnormal postural alignment and control Motor - Skilled Clinical Observations: dominant right side hemiplegia, decreased right side muscular endurance, decreased bilat LE power and eccentric control right > left  Mobility Bed Mobility Supine to Sit: 4: Min assist Sitting - Scoot to Edge of Bed: 4: Min assist Sitting - Scoot to [place] of Bed Details (indicate cue type and reason): decreased dissociation of trunk and pelvis with decreased grading of lateral weight shifts resulting in frequent posterior right LOB Sit to Supine: 4: Min assist Transfers Sit to Stand: 3: Mod assist;With upper extremity assist;From bed;From chair/3-in-1 Sit to Stand Details (indicate cue type and reason): patient shifted left with posterior lean Stand to Sit: 3: Mod assist Stand to Sit Details: posterior lean and decreased bilat LE eccentric control resulting in poorly controlled descent without physical assist Stand Pivot Transfers: 2: Max assist Stand Pivot Transfer Details (indicate cue type and reason): physical assist to lift and to lower Locomotion  Ambulation Ambulation/Gait Assistance: 3: Mod assist Ambulation Distance (Feet): 15 Feet Assistive device: 1 person hand held assist Ambulation/Gait Assistance Details (indicate cue type and reason): decreased active and sustained right trunk elongation, hip and knee extension. Right LE fatigues quickly with intermittent buckling at right knee. Decreased right foot clearance noted. Decreased graded right weight shift with frequent right LOB. Gait Gait Pattern: Step-to pattern (laterally flexed right, decreased right foot clearance with intermittent step-to pattern and knee buckle with fatigue) Stairs / Additional Locomotion Stairs: No Wheelchair Mobility Wheelchair Mobility: Yes Wheelchair Assistance:  4: Min Education officer, museum: Left upper extremity;Left lower extremity Wheelchair Parts Management: Needs assistance Distance: 35  Trunk/Postural Assessment  Cervical Assessment Cervical Assessment: Within Functional Limits Thoracic Assessment Thoracic Assessment: Exceptions to [facility] (laterally flexed right, decreased sustained elongation) Lumbar Assessment Lumbar Assessment: Exceptions to [facility] (posterior pelvic tilt, decreased dissociation trunk/pelvis) Postural Control Postural Control: Deficits on evaluation Postural Limitations: Right lateral trunk flexion with decreased active elongation, trunk collapses right with weight shifts and dynamic reaching. Righting reactions delayed resulting in frequent right and posterior LOB dynamically in sitting and standing.  Balance Balance Balance Assessed: Yes Standardized Balance Assessment Standardized Balance Assessment:  (BERG assessment on acute this am, score 19/56) Static Sitting Balance Static Sitting - Balance Support: No upper extremity supported;Feet supported Static Sitting - Level of Assistance: 5: Stand by assistance Dynamic Sitting  Balance Dynamic Sitting - Balance Support: Left upper extremity supported;Feet supported;During functional activity Dynamic Sitting - Level of Assistance: 4: Min assist Static Standing Balance Static Standing - Balance Support: No upper extremity supported Static Standing - Level of Assistance: 4: Min assist Dynamic Standing Balance Dynamic Standing - Balance Support: Left upper extremity supported;During functional activity Dynamic Standing - Level of Assistance: 3: Mod assist Extremity Assessment      RLE Assessment RLE Assessment: Exceptions to WFL (PROM WNL, strength grossly 2/5) LLE Assessment LLE Assessment: Within Functional Limits (AROM WNL, strength grossly 4/5)  Treatment initiated during session: sit to squat position progressing to sit to squat to stand, manual facilitation for  right weight shift and sustained right LE activation. Standing dynamic pre-gait activities with progression to gait with right hand held assist x 25 feet mod assist, manual facilitation at pelvis for increased hip extension and trunk elongation in stance phase as well as anterior right weight shift in stance to progress center of gravity over base of support.   See FIM for current functional status Refer to Care Plan for Long Term Goals  Recommendations for other services: None  Discharge Criteria: Patient will be discharged from PT if patient refuses treatment 3 consecutive times without medical reason, if treatment goals not met, if there is a change in medical status, if patient makes no progress towards goals or if patient is discharged from hospital.  The above assessment, treatment plan, treatment alternatives and goals were discussed and mutually agreed upon: by patient and by family  Romeo Rabon 04/23/2011, 4:59 PM

## 2011-04-23 NOTE — Plan of Care (Signed)
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 Co-morbidities: R shoulder and foot pain, tobacco abuse, HTN  Functional Problem List  Patient demonstrates impairments in the following areas: Balance, Bladder, Bowel, Endurance, Medication Management, Motor, Nutrition and Safety  Basic ADL's: grooming, bathing, dressing, toileting and showering Advanced ADL's: simple meal preparation  Transfers:  bed mobility, bed to chair, toilet, tub/shower, car, furniture and floor Locomotion:  ambulation, wheelchair mobility and stairs  Additional Impairments:  Functional use of upper extremity, Communication  expression and Social Cognition   problem solving and awareness  Anticipated Outcomes Item Anticipated Outcome  Eating/Swallowing    Basic self-care  Mod Independent  Tolieting  Mod independent  Bowel/Bladder  Mod independent  Transfers    Locomotion    Communication  Mod I  Cognition  Mod I  Pain  < 3 1-10 scale  Safety/Judgment  Mod independent  Other  Skin: min assist with skin care/turn/reposition. No new breakdown   Therapy Plan: PT Frequency: 1-2 X/day, 60-90 minutes;5 out of 7 days  OT frequency:  1-2 x/day; 60-90 minutes:  5 out of 7 days per week SLP Frequency: 1-2 X/day, 30-60 minutes   Team Interventions: Item RN PT OT SLP SW TR Other  Self Care/Advanced ADL Retraining  x x      Neuromuscular Re-Education  x x      Therapeutic Activities  x x x     UE/LE Strength Training/ROM  x x      UE/LE Coordination Activities   x      Visual/Perceptual Remediation/Compensation         DME/Adaptive Equipment Instruction  x x      Therapeutic Exercise  x x      Balance/Vestibular Training  x x      Patient/Family Education  x x x     Cognitive Remediation/Compensation    x     Functional Mobility Training  x x      Ambulation/Gait  Training  x       Stair Training  x       Wheelchair Propulsion/Positioning  x x      Functional Statistician  x       Community Reintegration  x       Dysphagia/Aspiration Landscape architect Facilitation    x     Bladder Management x        Bowel Management x        Disease Management/Prevention x        Pain Management x  x      Medication Management x        Skin Care/Wound Management x        Splinting/Orthotics  x x      Discharge Planning  x  x     Psychosocial Support  x                          Team Discharge Planning: Destination:  Home Projected Follow-up:  PT and Outpatient ;  OT- home health vs outpatient Projected Equipment Needs:  TBD as patient progresses Patient/family involved in discharge planning:  Yes  MD ELOS: 7 day Medical Rehab Prognosis:  Excellent Assessment: Admitted for R HP and dysarthria due to CVA  now requiring CIR level PT,OT,SLP 24/7 rehab RN and MD.

## 2011-04-23 NOTE — H&P (Signed)
Physical Medicine and Rehabilitation Admission H&P  Anna Lyons is an 56 y.o. female.   Chief Complaint  Patient presents with  . Code Stroke  : HPI: 56 year old right-handed female with history of TIA in July 2012 as well as hypertension and tobacco abuse admitted March 19 with right-sided weakness and slurred speech. 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 Carotid Dopplers with no internal carotid artery stenosis. Echocardiogram with ejection fraction of 60% without wall motion abnormalities and negative for emboli. Cerebral angiogram with no occlusions or dissection noted. The patient did receive TPA. Neurology services consulted placed on aspirin therapy. Speech and occupational therapy treatments ongoing with recommendations for inpatient rehabilitation services as well as M.D. request physical medicine rehabilitation consult  Review of Systems  Gastrointestinal: Positive for constipation.  Neurological: Positive for dizziness and headaches. Depression  All other systems reviewed and are negative   Past Medical History  Diagnosis Date  . Depression   . Hypertension   . Stroke     TIA in 08/2010   Past Surgical History  Procedure Date  . Mastectomy     bilateral with reconstruction in the 80s   History reviewed. No pertinent family history. Social History:  reports that she has been smoking Cigarettes.  She has a 5 pack-year smoking history. She does not have any smokeless tobacco history on file. She reports that she drinks about .6 ounces of alcohol per week. She reports that she does not use illicit drugs. Allergies:  Allergies  Allergen Reactions  . Codeine Itching   Medications Prior to Admission  Medication Dose Route Frequency Provider Last Rate Last Dose  . 0.9 %  sodium chloride infusion  250 mL Intravenous Once Dione Booze, MD 250 mL/hr at  04/20/11 1449 250 mL at 04/20/11 1449  . 0.9 %  sodium chloride infusion   Intravenous Continuous Oneal Grout, MD 75 mL/hr at 04/21/11 0125    . acetaminophen (TYLENOL) tablet 650 mg  650 mg Oral Q4H PRN Carmell Austria, MD   650 mg at 04/21/11 1450   Or  . acetaminophen (TYLENOL) suppository 650 mg  650 mg Rectal Q4H PRN Carmell Austria, MD      . alteplase (ACTIVASE) 1 mg/mL infusion 64 mg  0.9 mg/kg (Order-Specific) Intravenous Once Carmell Austria, MD   64 mg at 04/20/11 1446  . aspirin EC tablet 325 mg  325 mg Oral Daily Thana Farr, MD   325 mg at 04/23/11 1100  . hydrochlorothiazide (HYDRODIURIL) tablet 50 mg  50 mg Oral Daily Layne Benton, NP   50 mg at 04/23/11 1100  . iohexol (OMNIPAQUE) 300 MG/ML solution 150 mL  150 mL Intra-arterial Once PRN Oneal Grout, MD   50 mL at 04/20/11 1542  . pantoprazole (PROTONIX) injection 40 mg  40 mg Intravenous QHS Carmell Austria, MD   40 mg at 04/22/11 2303  . rOPINIRole (REQUIP) tablet 0.5 mg  0.5 mg Oral Once Noel Christmas   0.5 mg at 04/21/11 0018  . rOPINIRole (REQUIP) tablet 0.5 mg  0.5 mg Oral QHS Charles Stewart   0.5 mg at 04/22/11 2245  . senna-docusate (Senokot-S) tablet 1 tablet  1 tablet Oral QHS PRN Carmell Austria, MD      . sertraline (ZOLOFT) tablet 50 mg  50 mg Oral Daily Layne Benton, NP   50 mg at 04/23/11 1100  . traMADol (ULTRAM) tablet  100 mg  100 mg Oral Q6H PRN Layne Benton, NP   100 mg at 04/22/11 2303  . DISCONTD: 0.9 %  sodium chloride infusion   Intravenous Continuous Carmell Austria, MD      . DISCONTD: alteplase (ACTIVASE) 1 mg/mL infusion 0.9 mg/kg  0.9 mg/kg Intravenous Once Dione Booze, MD      . DISCONTD: labetalol (NORMODYNE,TRANDATE) injection 10 mg  10 mg Intravenous Q10 min PRN Carmell Austria, MD      . DISCONTD: ondansetron [facility]) injection 4 mg  4 mg Intravenous Q6H PRN Carmell Austria, MD       No current outpatient prescriptions on file as of 04/23/2011.    Home: Home Living Lives With: Spouse Receives Help  From: Family (daughter) Type of Home: House Home Layout: Two level;Able to live on main level with bedroom/bathroom Alternate Level Stairs-Rails: Right Alternate Level Stairs-Number of Steps: 12 Home Access: Ramped entrance Bathroom Shower/Tub: Tub only;Tub/shower unit;Curtain (tub only downstairs, tub/shower upstairs) Bathroom Toilet: Standard Bathroom Accessibility: Yes How Accessible: Accessible via walker Home Adaptive Equipment: Wheelchair - manual;Walker - rolling;Bedside commode/3-in-1;Tub transfer bench   Functional History: Prior Function Level of Independence: Independent with basic ADLs;Independent with gait;Independent with transfers Able to Take Stairs?: Yes Driving: Yes Vocation: Full time employment Vocation Requirements: Conservation officer, nature at [employer] Status:  Mobility: Bed Mobility Bed Mobility: Yes Supine to Sit: 4: Min assist;HOB flat Supine to Sit Details (indicate cue type and reason): focus on sequencing and positioning of right UE and LE for increased functional independence Sitting - Scoot to [place] of Bed: 4: Min assist Sitting - Scoot to [place] of Bed Details (indicate cue type and reason): decreased dissociation of trunk and pelvis with decreased grading of lateral weight shifts resulting in frequenct posterior right LOB Sit to Supine: 4: Min assist;HOB flat Sit to Supine - Details (indicate cue type and reason): focus on sequencing, verbal cues for attention to right UE and positioning right UE and LE for increased functional independence Transfers Transfers: Yes Sit to Stand: 3: Mod assist;With upper extremity assist;From bed Sit to Stand Details (indicate cue type and reason): Manual facilitation for anterior weight right shift for symmetry and right LE placement. Posterior left lean with decreased weight bearing through right LE.  Stand to Sit: 3: Mod assist;With upper extremity assist;To bed Stand to Sit Details: Posterior lean and decreased  bilat LE eccentric control resulting in poorly controlled descent witout physical assist Ambulation/Gait Ambulation/Gait: Yes Ambulation/Gait Assistance: 3: Mod assist Ambulation/Gait Assistance Details (indicate cue type and reason): right hand held assist, manual facilitation at pelvis for increased grading of right lateral weight shift, increased right hip extension and trunk elongation in stance phase, and progression of center of gravity over BOS in right stance phase. Right LE fatigues quickly with intermittent buckling. Ambulation Distance (Feet): 35 Feet Assistive device: 1 person hand held assist Gait Pattern:  (decreased right foot clearance with intermittent step to) Stairs: No Wheelchair Mobility Wheelchair Mobility: No  ADL: ADL Eating/Feeding: Performed;Set up Eating/Feeding Details (indicate cue type and reason): setup to cut food and open containers Where Assessed - Eating/Feeding: Bed level Lower Body Dressing: Performed;Set up Lower Body Dressing Details (indicate cue type and reason): Pt donne bil. slip on shoes using bil. UE and ankles crossed over legs. Where Assessed - Lower Body Dressing: Sitting, bed Toilet Transfer: Simulated;+2 Total assistance;Comment for patient % (70%) Toilet Transfer Details (indicate cue type and reason): bil. HHA for steadying and balance. bed to chair.  Toilet Transfer Method: Proofreader: Other (comment) (chair) Equipment Used: Other (comment) (bil. HHA) Ambulation Related to ADLs: Pt with shuffled gait and Rt. knee buckling ADL Comments: Pt encourage to incorporate use of Rt. UE during ADL activity.  Anticipate pt will require min assist with UB ADLs due to decreased functional use of Rt. UE.  Cognition: Cognition Overall Cognitive Status: Appears within functional limits for tasks assessed Arousal/Alertness: Awake/alert Orientation Level: Oriented X4 Attention: Selective;Alternating Selective Attention:  Appears intact Alternating Attention: Appears intact Memory: Appears intact Awareness: Appears intact Problem Solving: Appears intact Executive Function: Reasoning;Decision Making;Initiating;Self Monitoring Reasoning: Appears intact Decision Making: Appears intact Initiating: Appears intact Self Monitoring: Appears intact Safety/Judgment: Appears intact Cognition Arousal/Alertness: Awake/alert Overall Cognitive Status: Appears within functional limits for tasks assessed Orientation Level: Oriented X4   Blood pressure 120/76, pulse 59, temperature 98.4 F (36.9 C), temperature source Oral, resp. rate 20, height 5\' 5"  (1.651 m), weight 64.7 kg (142 lb 10.2 oz), SpO2 97.00%. Physical Exam  Vitals reviewed.  Constitutional: She is oriented to person, place, and time. She appears well-developed.  HENT:  Head: Normocephalic.  Neck: Normal range of motion. Neck supple. No thyromegaly present.  Cardiovascular: Normal rate.  Pulmonary/Chest: Breath sounds normal. She has no wheezes.  Abdominal: She exhibits no distension. There is no tenderness.  Musculoskeletal: She exhibits no edema.  Neurological: She is alert and oriented to person, place, and time.  Dysarthric speech but fully intelligible with right facial droop  Skin: Skin is warm and dry.  Psychiatric: She has a normal mood and affect.  NIH stroke scale is 4  Motor strength is 2 minus at the right deltoid, biceps, triceps, grip. 5/5 on the left side in the upper extremity.  Motor strength is 4/5 in the right hip flexor, knee extensors, ankle dorsiflexor and 5/5 in these muscles on the left  Sensation is intact to light touch.  There is no evidence of tactile or visual extinction  Visual fields are intact confrontation testing  Cerebellar testing shows no evidence of ataxia   No results found for this or any previous visit (from the past 48 hour(s)). Mr Brain Wo Contrast  04/21/2011  *RADIOLOGY REPORT*  Clinical Data:  Stroke.  Status post t-PA.  MRI HEAD WITHOUT CONTRAST  Technique:  Multiplanar, multiecho pulse sequences of the brain and surrounding structures were obtained according to standard protocol without intravenous contrast.  Comparison: CT head without contrast 04/20/2011.  Findings: An acute non hemorrhagic infarct is present within the left lentiform nucleus.  There are more remote lacunar infarcts immediately adjacent to this area as well as a remote lacunar infarct in the right lentiform nucleus.  Mild periventricular and scattered subcortical T2 and FLAIR hyperintensities otherwise are noted.  Flow is present in the major intracranial arteries.  No mass lesion is present.  No focal hemorrhage is evident.  The paranasal sinuses and left mastoid air cells are clear.  There is fluid in the right mastoid air cells.  No obstructing nasopharyngeal lesion is evident.  IMPRESSION:  1.  Acute non hemorrhagic infarct involving the posterior left lentiform nucleus. 2.  More remote lacunar infarcts are present in the basal ganglia bilaterally. 3.  Mild atrophy and white matter disease.  This likely reflects the sequelae of chronic microvascular ischemia. 4.  Right mastoid effusion.  No obstructing nasopharyngeal lesion is evident.  Original Report Authenticated By: Jamesetta Orleans. MATTERN, M.D.    Post Admission Physician Evaluation: 1. Functional deficits secondary  To a subcortical left brain infarct. 2. Patient is admitted to receive collaborative, interdisciplinary care between the physiatrist, rehab nursing staff, and therapy team. 3. Patient's level of medical complexity and substantial therapy needs in context of that medical necessity cannot be provided at a lesser intensity of care such as a SNF. 4. Patient has experienced substantial functional loss from his/her baseline which was documented above under the "Functional History" and "Functional Status" headings.  Judging by the patient's diagnosis, physical  exam, and functional history, the patient has potential for functional progress which will result in measurable gains while on inpatient rehab.  These gains will be of substantial and practical use upon discharge  in facilitating mobility and self-care at the household level. 5. Physiatrist will provide 24 hour management of medical needs as well as oversight of the therapy plan/treatment and provide guidance as appropriate regarding the interaction of the two. 6. 24 hour rehab nursing will assist with bladder management, bowel management, safety, skin/wound care, disease management, medication administration, pain management and patient education  and help integrate therapy concepts, techniques,education, etc. 7. PT will assess and treat for:  Lower extremity strength, balance, NMR, safety, adaptive equipment training.  Goals are: modified independent. 8. OT will assess and treat for: UES, ADL's, functional mobility, safety, adaptive equipment training, family ed.   Goals are: modified independent to supervision. 9. SLP will assess and treat for:not applicable 10. Case Management and Social Worker will assess and treat for psychological issues and discharge planning. 11. Team conference will be held weekly to assess progress toward goals and to determine barriers to discharge. 12.  Patient will receive at least 3 hours of therapy per day at least 5 days per week. 13. ELOS and Prognosis: 10-14 days excellent   Medical Problem List and Plan: 1. left subcortical infarction 2. DVT Prophylaxis/Anticoagulation: SCDs. Monitor for any signs of deep vein thrombosis 3. Hypertension. Hydrochlorothiazide 50 mg daily. Monitor with increased mobility 4. Depression. Zoloft. Provide emotional support and positive reinforcement 5. Tobacco abuse. Consult to tobacco cessation counselor obtained while on acute care services 6. Pain management. Ultram as needed for intermittent headaches  Ivory Broad, MD 04/23/2011

## 2011-04-23 NOTE — Progress Notes (Signed)
I met with patient, spouse, and daughter at bedside. All in agreement to an inpatient acute rehab stay prior to d/c home with spouse. I have contacted the financial counselor, Merry Proud, to request disability and medicaid applications for patient is self pay. We can admit patient today. I contacted attending physician to facilitate discharge. Please call for any questions. Pager [phone]

## 2011-04-23 NOTE — Progress Notes (Signed)
Physical Therapy Treatment Patient Details Name: Anna Lyons MRN: [id number] DOB: 1955/10/29 Today's Date: 04/23/2011  PT Assessment/Plan  PT - Assessment/Plan Comments on Treatment Session: Patient is very motivated and is progressing well with PT. Currently demonstrating significant balance and postural impairments resulting in decreased functional independence. BERG balance assessment completed with score of 19/56 = 100% risk of fall at this time. PT feels patient is an excellent CIR candidate for maximum functional gains prior to d/c home with spouse who had CVA 1.5 years ago. Daughter and son-in-law are also able to provide assist at d/c. PT Plan: Discharge plan remains appropriate PT Goals  Acute Rehab PT Goals PT Goal: Supine/Side to Sit - Progress: Progressing toward goal PT Goal: Sit at [facility] Of Bed - Progress: Progressing toward goal PT Goal: Sit to Supine/Side - Progress: Progressing toward goal PT Goal: Sit to Stand - Progress: Progressing toward goal PT Goal: Stand to Sit - Progress: Progressing toward goal PT Goal: Stand - Progress: Progressing toward goal PT Goal: Ambulate - Progress: Progressing toward goal  PT Treatment Precautions/Restrictions  Precautions Precautions: Fall Required Braces or Orthoses: No Restrictions Weight Bearing Restrictions: No Other Position/Activity Restrictions: dominant right side hemiplegia Mobility (including Balance) Bed Mobility Bed Mobility: Yes Supine to Sit: 4: Min assist;HOB flat Supine to Sit Details (indicate cue type and reason): focus on sequencing and positioning of right UE and LE for increased functional independence Sitting - Scoot to Edge of Bed: 4: Min assist Sitting - Scoot to [place] of Bed Details (indicate cue type and reason): decreased dissociation of trunk and pelvis with decreased grading of lateral weight shifts resulting in frequenct posterior right LOB Sit to Supine: 4: Min assist;HOB flat Sit to Supine - Details  (indicate cue type and reason): focus on sequencing, verbal cues for attention to right UE and positioning right UE and LE for increased functional independence Transfers Sit to Stand: 3: Mod assist;With upper extremity assist;From bed Sit to Stand Details (indicate cue type and reason): Manual facilitation for anterior right weight shift for symmetry and right LE placement. Posterior left lean with decreased weight bearing through right LE.  Stand to Sit: 3: Mod assist;With upper extremity assist;To bed Stand to Sit Details: Posterior lean and decreased bilat LE eccentric control resulting in poorly controlled descent witout physical assist Ambulation/Gait Ambulation/Gait Assistance: 3: Mod assist Ambulation/Gait Assistance Details (indicate cue type and reason): right hand held assist, manual facilitation at pelvis for increased grading of right lateral weight shift, increased right hip extension and trunk elongation in stance phase, and progression of center of gravity over BOS in right stance phase. Right LE fatigues quickly with intermittent buckling. Ambulation Distance (Feet): 35 Feet Assistive device: 1 person hand held assist Gait Pattern:  (decreased right foot clearance with intermittent step to pattern. Right LE fatigues quickly with mobility with intermittent right knee buckling and decreased foot clearance resulting in LOB with mod-max assist for correction) Stairs: No Wheelchair Mobility Wheelchair Mobility: No  Posture/Postural Control Posture/Postural Control: Postural limitations Postural Limitations: Right lateral trunk flexion with decreased active elongation, trunk collapses right with weight shifts and dynamic reaching. Righting reactions delayed resulting in frequent right and posterior LOB dynamically in sitting and standing.  Berg Balance Test Sit to Stand: Needs minimal aid to stand or to stabilize Standing Unsupported: Able to stand 30 seconds unsupported Sitting with  Back Unsupported but Feet Supported on Floor or Stool: Able to sit safely and securely 2 minutes Stand to Sit: Sits independently,  has uncontrolled descent Transfers: Needs one person to assist Standing Unsupported with Eyes Closed: Needs help to keep from falling Standing Ubsupported with Feet Together: Able to place feet together independently but unable to hold for 30 seconds From Standing, Reach Forward with Outstretched Arm: Can reach forward >12 cm safely (5") From Standing Position, Pick up Object from Floor: Unable to pick up shoe, but reaches 2-5 cm (1-2") from shoe and balances independently From Standing Position, Turn to Look Behind Over each Shoulder: Looks behind one side only/other side shows less weight shift Turn 360 Degrees: Needs assistance while turning Standing Unsupported, Alternately Place Feet on Step/Stool: Needs assistance to keep from falling or unable to try Standing Unsupported, One Foot in Front: Loses balance while stepping or standing Standing on One Leg: Unable to try or needs assist to prevent fall Total Score: 19    End of Session PT - End of Session Activity Tolerance: Patient tolerated treatment well Patient left: in bed;with call bell in reach;with family/visitor present General Behavior During Session: [facility] for tasks performed Cognition: [facility] for tasks performed  Romeo Rabon 04/23/2011, 9:45 AM

## 2011-04-23 NOTE — Progress Notes (Signed)
Stroke Team Progress Note  HISTORY Anna Lyons is an 56 y.o. female with new right-sided hemiparesis since about 12:00 pm 04/20/2011 who had NIHSS of 7 on arrival and was a stroke code that had received t-PA. She subsequently went for an emergent angiogram which did not show any large vessel occlusion or stenosis   SUBJECTIVE Multiple family members at bedside. Plans for CIR today. She is anxious and ready to go.  OBJECTIVE Filed Vitals:   04/22/11 2140 04/23/11 0146 04/23/11 0634 04/23/11 1007  BP: 160/91 129/81 134/73 120/76  Pulse: 59 57 55 59  Temp: 98.1 F (36.7 C) 97.8 F (36.6 C) 98 F (36.7 C) 98.4 F (36.9 C)  TempSrc: Oral Oral Oral Oral  Resp: 18 18 20 20   Height:      Weight:      SpO2: 95% 95% 94% 97%   CBG (last 3) No results found for this basename: GLUCAP:3 in the last 72 hours Intake/Output from previous day: 03/21 0701 - 03/22 0700 In: 585 [P.O.:360; I.V.:225] Out: 611 [Urine:611]  IV Fluid Intake:    Medications    . aspirin EC  325 mg Oral Daily  . hydrochlorothiazide  50 mg Oral Daily  . pantoprazole (PROTONIX) IV  40 mg Intravenous QHS  . rOPINIRole  0.5 mg Oral QHS  . sertraline  50 mg Oral Daily  PRN acetaminophen, acetaminophen, senna-docusate, traMADol, DISCONTD: labetalol, DISCONTD: ondansetron (ZOFRAN) IV  Diet:  Cardiac Activity: out of bed  DVT Prophylaxis:  SCDs   Significant Diagnostic Studies: CBC    Component Value Date/Time   WBC 6.1 04/20/2011 1405   RBC 3.91 04/20/2011 1405   HGB 14.3 04/20/2011 1409   HCT 42.0 04/20/2011 1409   PLT 165 04/20/2011 1405   MCV 98.5 04/20/2011 1405   MCH 35.3* 04/20/2011 1405   MCHC 35.8 04/20/2011 1405   RDW 12.4 04/20/2011 1405   LYMPHSABS 1.8 04/20/2011 1405   MONOABS 0.5 04/20/2011 1405   EOSABS 0.1 04/20/2011 1405   BASOSABS 0.0 04/20/2011 1405   CMP    Component Value Date/Time   NA 138 04/20/2011 1409   K 4.0 04/20/2011 1409   CL 102 04/20/2011 1409   CO2 27 04/20/2011 1405   GLUCOSE 91  04/20/2011 1409   BUN 13 04/20/2011 1409   CREATININE 0.70 04/20/2011 1409   CALCIUM 9.4 04/20/2011 1405   PROT 7.1 04/20/2011 1405   ALBUMIN 3.6 04/20/2011 1405   AST 21 04/20/2011 1405   ALT 11 04/20/2011 1405   ALKPHOS 70 04/20/2011 1405   BILITOT 0.5 04/20/2011 1405   GFRNONAA >90 04/20/2011 1405   GFRAA >90 04/20/2011 1405   COAGS Lab Results  Component Value Date   INR 1.04 04/20/2011   INR 0.92 08/08/2010   Lipid Panel    Component Value Date/Time   CHOL 144 04/21/2011 0400   TRIG 88 04/21/2011 0400   HDL 44 04/21/2011 0400   CHOLHDL 3.3 04/21/2011 0400   VLDL 18 04/21/2011 0400   LDLCALC 82 04/21/2011 0400   HgbA1C  Lab Results  Component Value Date   HGBA1C 5.4 04/21/2011   Urine Drug Screen  No results found for this basename: labopia,  cocainscrnur,  labbenz,  amphetmu,  thcu,  labbarb    Alcohol Level No results found for this basename: eth   Cardiac enzymes neg MRSA neg  CT of the brain  04/20/2011  1.  Stable head CT.  No acute intracranial abnormality identified. 2.  Stable  mild subcortical white matter hypoattenuation, likely reflecting chronic microvascular ischemia. 3.  Stable, remote right putamen lacunar infarct.   Cerebral angio  04/20/2011 S/P Bilateral CCA and Lt Vert artery angiograms, RT CFA approach, No occlusions,dissections stenosis or filling defects seen   MRI of the brain  04/21/2011 1.  Acute non hemorrhagic infarct involving the posterior left lentiform nucleus. 2.  More remote lacunar infarcts are present in the basal ganglia bilaterally. 3.  Mild atrophy and white matter disease.  This likely reflects the sequelae of chronic microvascular ischemia. 4.  Right mastoid effusion.  No obstructing nasopharyngeal lesion is evident.   MRA of the brain  Ordered. Will cancel. See angio  2D Echocardiogram  EF 55-60% with no source of embolus.   Carotid Doppler  No internal carotid artery stenosis bilaterally. Vertebrals with antegrade flow bilaterally.   CXR   04/20/2011 Borderline cardiomegaly without decompensation.  EKG  Pending Physical Exam   pleasant elderly Caucasian lady currently not in distress. Afebrile. Head is nontraumatic. Neck is supple without bruit. Hearing is normal. Cardiac exam no murmur or gallop. Lungs are clear to auscultation. Distal pulses are well felt.   Neurological Exam : Awake  Alert oriented x 3. Normal speech and language.eye movements full without nystagmus. Face asymmetric with mild right lower face weakness. Tongue midline. Normal strength, tone, reflexes and coordination on the left. Mild right upper and lower extremity drift  with 4/5 strength. Weakness of right grip and intrinsic hand muscles.. Normal sensation. Gait deferred.   ASSESSMENT Anna Lyons is a 56 y.o. 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 Not on antiplatelets prior admission. Patient with resultant hemiparesis/ weakness of right face, right arm(s) or right lower leg(s)  -hypertension -cigarette smoker -headache, no relief from tylenol, allergic to codeine  Hospital day # 3  TREATMENT/PLAN -transfer to rehab today -stop smoking -Stroke Service will sign off. Follow up with Dr. Pearlean Brownie in 2 months.  Joaquin Music, ANP-BC, GNP-BC [facility] Pager: [phone] 04/23/2011 11:21 AM  Dr. Delia Heady, Stroke Center Medical Director, has personally reviewed chart, pertinent data, examined the patient and developed the plan of care.

## 2011-04-23 NOTE — PMR Pre-admission (Signed)
PMR Admission Coordinator Pre-Admission Assessment  Patient:  Anna Lyons is an 56 y.o., female MRN:  [id number] DOB:  1955-11-15 Height:  5\' 5"  (165.1 cm) Weight:  64.7 kg (142 lb 10.2 oz)  Insurance Information:  PRIMARY:  None    Patient worked less than 3 weeks at current job therefore Rosann Auerbach has not began coverage I contacted Merry Proud, Artist on 04/23/11 to initiate Medicaid and disability applications. Merry Proud will coordinate with daughter and number provided. Medicaid application for 05/03/11 to be retroactive after d/c,  Medicaid Application Date:pending      Case Manager: Disability Application Date:pending      Case Worker:  Current Medical History:   Patient Admitting Diagnosis: Left subcortical infarction s/p TPA  History of Present Illness: Admitted 04/20/11 with new right sided hemiparesis  With an NIHSS of 7 on arrival. Patient did receive TPA. Subsequently went for an emergent angiogram which did not show any large vessel occlusion or stenosis.Not on antiplatelets prior to admission. Placed on aspirin therapy.   Patients Past Medical History:   Past Medical History  Diagnosis Date  . Depression   . Hypertension   . Stroke     TIA in 08/2010   Family Medical History:  family history is not on file. NIH Stroke scale: Total: 6  Glascow Coma Scale:   Patients Current Diet: Cardiac  Prior Rehab/Hospitalizations: none. Spouse on CIR 2/12 after a CVA Medications Current Medications: Current facility-administered medications:acetaminophen (TYLENOL) suppository 650 mg, 650 mg, Rectal, Q4H PRN, Carmell Austria, MD;  acetaminophen (TYLENOL) tablet 650 mg, 650 mg, Oral, Q4H PRN, Carmell Austria, MD, 650 mg at 04/21/11 1450;  aspirin EC tablet 325 mg, 325 mg, Oral, Daily, Thana Farr, MD, 325 mg at 04/23/11 1100;  hydrochlorothiazide (HYDRODIURIL) tablet 50 mg, 50 mg, Oral, Daily, Layne Benton, NP, 50 mg at 04/23/11 1100 pantoprazole (PROTONIX) injection 40 mg, 40 mg,  Intravenous, QHS, Carmell Austria, MD, 40 mg at 04/22/11 2303;  rOPINIRole (REQUIP) tablet 0.5 mg, 0.5 mg, Oral, QHS, Noel Christmas, 0.5 mg at 04/22/11 2245;  senna-docusate (Senokot-S) tablet 1 tablet, 1 tablet, Oral, QHS PRN, Carmell Austria, MD;  sertraline (ZOLOFT) tablet 50 mg, 50 mg, Oral, Daily, Layne Benton, NP, 50 mg at 04/23/11 1100 traMADol (ULTRAM) tablet 100 mg, 100 mg, Oral, Q6H PRN, Layne Benton, NP, 100 mg at 04/22/11 2303;  DISCONTD: labetalol (NORMODYNE,TRANDATE) injection 10 mg, 10 mg, Intravenous, Q10 min PRN, Carmell Austria, MD;  DISCONTD: ondansetron [facility]) injection 4 mg, 4 mg, Intravenous, Q6H PRN, Carmell Austria, MD  Precautions/Special Needs:    Additional Precautions/Restrictions: Precautions Precautions: Fall Required Braces or Orthoses: No Restrictions Weight Bearing Restrictions: No Other Position/Activity Restrictions: dominant right side hemiplegia  Therapy Assessments  Prior Function: Level of Independence: Independent with basic ADLs;Independent with gait;Independent with transfers Able to Take Stairs?: Yes Driving: Yes Vocation: Full time employment Vocation Requirements: Conservation officer, 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 slip on shoes using bil. UE and ankles crossed over legs. Where Assessed - Lower Body Dressing:  Sitting, bed Toilet Transfer: Simulated;+2 Total assistance;Comment for  patient % (70%) Toilet Transfer Details (indicate cue type and reason): bil. HHA for steadying and balance. bed to chair. Toilet Transfer Method: Proofreader: Other (comment) (chair) Equipment Used: Other (comment) (bil. HHA) Ambulation Related to ADLs: Pt with shuffled gait and Rt. knee buckling ADL Comments: Pt encourage to incorporate use of Rt. UE during ADL activity.  Anticipate pt will require min assist with UB ADLs due to decreased functional use of Rt. UE.  Bed Mobility Bed Mobility: Yes Supine to Sit: 4: Min assist;HOB flat Supine to Sit Details (indicate cue type and reason): focus on sequencing and positioning of right UE and LE for increased functional independence Sitting - Scoot to Edge of Bed: 4: Min assist Sitting - Scoot to [employer] of Bed Details (indicate cue type and reason): decreased dissociation of trunk and pelvis with decreased grading of lateral weight shifts resulting in frequenct posterior right LOB Sit to Supine: 4: Min assist;HOB flat Sit to Supine - Details (indicate cue type and reason): focus on sequencing, verbal cues for attention to right UE and positioning right UE and LE for increased functional independence Transfers Transfers: Yes Sit to Stand: 3: Mod assist;With upper extremity assist;From bed Sit to Stand Details (indicate cue type and reason): Manual facilitation for anterior weight right shift for symmetry and right LE placement. Posterior left lean with decreased weight bearing through right LE.  Stand to Sit: 3: Mod assist;With upper extremity assist;To bed Stand to Sit Details: Posterior lean and decreased bilat LE eccentric control resulting in poorly controlled descent witout physical assist Ambulation/Gait Ambulation/Gait: Yes Ambulation/Gait Assistance: 3: Mod assist Ambulation/Gait Assistance Details (indicate cue type and reason): right hand held assist, manual facilitation at pelvis for increased grading of right  lateral weight shift, increased right hip extension and trunk elongation in stance phase, and progression of center of gravity over BOS in right stance phase. Right LE fatigues quickly with intermittent buckling. Ambulation Distance (Feet): 35 Feet Assistive device: 1 person hand held assist Gait Pattern:  (decreased right foot clearance with intermittent step to) Stairs: No Wheelchair Mobility Wheelchair Mobility: No Posture/Postural Control Posture/Postural Control: Postural limitations Postural Limitations: Right lateral trunk flexion with decreased active elongation, trunk collapses right with weight shifts and dynamic reaching. Righting reactions delayed resulting in frequent right and posterior LOB dynamically in sitting and standing.  Balance Balance Assessed: Yes Static Sitting Balance Static Sitting - Balance Support: Feet supported Static Sitting - Level of Assistance: 4: Min assist (minguard) Static Sitting - Comment/# of Minutes: Minguard (A) for safety  Home Assistive Devices/Equipment:  Home Assistive Devices/Equipment Home Assistive Devices/Equipment: None  Discharge Planning:  Living Arrangements: Spouse/significant other Support Systems: Spouse/significant other;Children Assistance Needed: not at this time Do you have any problems obtaining your medications?: No Type of Residence: Private residence Home Care Services: No Patient expects to be discharged to:: to home Expected Discharge Date:  (ELOS 7 to 10 days) Case Management Consult Needed: Yes (Comment)  Previous Home Environment:  Living Arrangements: Spouse/significant other Support Systems: Spouse/significant other;Children Assistance Needed: not at this time Do you have any problems obtaining your medications?: No Type of Residence: Private residence Home Care Services: No Patient expects to be discharged to:: to home Expected Discharge Date:  (ELOS 7 to 10 days) Home Environment Number of Levels: two  level Previous Home Environment Number of Steps: ramped entry Previous Home Environment Is Bedroom on Main Floor?: Yes Previous Home Environment Is  Bathroom on Main Floor?: Yes  Discharge Living Setting:  Plans for Discharge Living Setting: Patient's home;Lives with (comment) (spouse) Discharge Living Setting Number of Levels: two level Discharge Living Setting Number of Steps: ramped entry Discharge Living Setting is Bedroom on Main Floor?: Yes Discharge Living Setting is Bathroom on Main Floor?: Yes  Social/Family/Support Systems:  Patient Roles: Spouse;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[employer]) Contact Information: Naoma Diener and [employer] Anticipated Caregiver: spouse and daughter Anticipated Caregiver's Contact Information: spouse home is [phone], cell [phone] Bevely Palmer, daughter, cell is [phone]) Ability/Limitations of Caregiver: spouse h/o cva 03/2010 Mod I with cane, Dtr daily but intermittent Caregiver Availability: 24/7 Discharge Plan Discussed with Primary Caregiver: Yes (also spoke with dtr) Is Caregiver In Agreement with Plan?: Yes Does Caregiver/Family have Issues with Lodging/Transportation while Pt is in Rehab?: No  Goals/Additional Needs:  Patient/Family Goal for Rehab: Mod I PT, Mod I Ot adls, Mod I slp Special Service Needs: Patient needs a letter for work to give on expected recovery period to retrun to work Pt/Family Agrees to Admission and willing to participate: Yes Program Orientation Provided & Reviewed with Pt/Caregiver Including Roles  & Responsibilities: Yes  Preadmission Screen Completed By:  Clois Dupes, 04/23/2011 11:53 AM  Patient's condition:  This patient's condition remains as documented in the Consult dated 04/22/11, in which the Rehabilitation Physician determined and documented that the patient's condition is appropriate for intensive rehabilitative care in an inpatient rehabilitation facility.  Preadmission Screen Competed by:Ottie Glazier, RN, Time/Date, [place] on 04/23/11.  Discussed status with Dr. Riley Kill on 04/23/11 at 1201 and received telephone approval for admission today.  Admission Coordinator:  Clois Dupes, time 5784 Date 04/23/11

## 2011-04-23 NOTE — Progress Notes (Addendum)
Patient arrived to 4029 at 1500 with daughter and husband. Report received from [place], [place] 3000. Patient alert and oriented x4. Patient and family oriented to unit, rehab schedule, call bell system, safety plan. Patient and family familiar with rehab due to husband's stay last year. Enthusiastic about stay and very motivated. Anna Lyons

## 2011-04-23 NOTE — Progress Notes (Signed)
Utilization review completed. Nickcole Bralley, RN, BSN. 04/23/11  

## 2011-04-23 NOTE — Discharge Summary (Signed)
Report called to [place] on 4000, pt's belongings and family moved to [employer] 4029.

## 2011-04-24 DIAGNOSIS — I6330 Cerebral infarction due to thrombosis of unspecified cerebral artery: Secondary | ICD-10-CM

## 2011-04-24 DIAGNOSIS — Z5189 Encounter for other specified aftercare: Secondary | ICD-10-CM

## 2011-04-24 MED ORDER — ROPINIROLE HCL 0.25 MG PO TABS
0.2500 mg | ORAL_TABLET | Freq: Every day | ORAL | Status: DC
  Administered 2011-04-24 – 2011-04-29 (×6): 0.25 mg via ORAL
  Filled 2011-04-24 (×7): qty 1

## 2011-04-24 NOTE — Evaluation (Signed)
Speech Language Pathology Assessment and Plan  Patient Details  Name: Anna Lyons MRN: [id number] Date of Birth: 13-Jun-1955  SLP Diagnosis: Dysarthria, mild cognitive impairment Rehab Potential: Excellent ELOS: 7-10 days  Today's Date: 04/24/2011 Time: 1000-1100 Time Calculation (min): 60 min  Therapeutic Intervention: Administered cognitive-linguistic evaluation, please see below for details.   Short Term Goals: SLP Short Term Goal 1 (Week 1): Pt will utilize speech intelligibility strategies with Mod I during conversational speech and be 100% intelligible.  SLP Short Term Goal 2 (Week 1): Pt will demonstrate complex problem solving for functional tasks with Mod I. SLP Short Term Goal 3 (Week 1): Pt will demonstrate emergent awareness into deficits and utilize call bell to call for assistance for wants/needs with Mod I.   Problem List:  Patient Active Problem List  Diagnoses  . Depression  . CVA (cerebral infarction)    Past Medical History:  Past Medical History  Diagnosis Date  . Depression   . Hypertension   . Stroke     TIA in 08/2010   Past Surgical History:  Past Surgical History  Procedure Date  . Mastectomy     bilateral with reconstruction in the 80s    Assessment & Plan Clinical Impression: Patient is a 56 y.o right-handed female with history of TIA in July 2012, hypertension, and tobacco abuse admitted 04/20/11 with right-sided weakness and slurred speech. 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 Carotid Dopplers with no internal carotid artery stenosis. Echocardiogram with ejection fraction of 60% without wall motion abnormalities and negative for emboli. Cerebral angiogram with no occlusions or dissection noted. The patient did receive TPA. Patient [facility] to [facility] 04/23/11 for continued therapies prior to d/c home. PTA , patient was independent  with all mobility and ADLs and provided intermittent care for husband who had CVA 1.5 years ago, and worked full time as Conservation officer, nature at [employer]. Pt presents with mild cognitive impairments characterized by decreased complex problem solving and safety awareness. Pt also demonstrates a mild dysarthria impacting intelligibility at the conversation level. Pt would benefit from skilled SLP services to maximize cognitive function, speech intelligibility and overall functional independence for d/c home.   SLP - End of Session Patient left: in chair;with call bell in reach Assessment SLP Recommendation/Assessment: Patient will need skilled Speech Lanaguage Pathology Services during CIR admission Rehab Potential: Excellent Barriers to Discharge: None Therapy Diagnosis: Dysarthria;Cognitive Impairments SLP Plan SLP Frequency: 1-2 X/day, 30-60 minutes SLP Treatment/Interventions: Cognitive remediation/compensation;Cueing hierarchy;Environmental controls;Functional tasks;Internal/external aids;Speech/Language facilitation;Therapeutic Activities;Patient/family education;Oral motor exercises Recommendation Follow up Recommendations:  (TBD) Equipment Recommended: None recommended by SLP  SLP Evaluation Precautions/Restrictions  Restrictions Weight Bearing Restrictions: No Pain Pain Assessment Pain Assessment: No/denies pain Cognition Overall Cognitive Status: Impaired Arousal/Alertness: Awake/alert Orientation Level: Oriented X4 Attention: Divided Selective Attention: Appears intact Alternating Attention: Appears intact Memory: Appears intact Awareness: Impaired Awareness Impairment: Emergent impairment Problem Solving: Appears intact Executive Function: Decision Making Reasoning: Appears intact Decision Making: Impaired Decision Making Impairment: Functional complex Initiating: Appears intact Self Monitoring: Appears intact Safety/Judgment: Impaired Comments: Pt verbalizing ability to  transfer to bathroom without asisstance and did not understand why she needed help from staff. Pt also leaning of bed to reach for items that were too far away.  Comprehension Auditory Comprehension Yes/No Questions: Within Functional Limits Commands: Within Functional Limits Conversation: Complex Visual Recognition/Discrimination Discrimination: Within Function Limits Reading Comprehension Reading Status: Not tested Expression Expression Primary  Mode of Expression: Verbal Verbal Expression Overall Verbal Expression: Appears within functional limits for tasks assessed Written Expression Dominant Hand: Right Written Expression:  (not tested secondary to weakness) Oral/Motor Oral Motor/Sensory Function Overall Oral Motor/Sensory Function: Impaired Labial ROM: Reduced right Labial Symmetry: Abnormal symmetry right Labial Strength: Reduced Lingual ROM: Reduced right Lingual Symmetry: Abnormal symmetry right Lingual Strength: Reduced Lingual Sensation: Reduced Facial ROM: Reduced right Facial Symmetry: Right droop Facial Strength: Reduced Velum: Within Functional Limits Mandible: Within Functional Limits Motor Speech Overall Motor Speech: Impaired Respiration: Within functional limits Phonation: Normal Resonance: Within functional limits Articulation: Impaired Level of Impairment: Conversation Intelligibility: Intelligibility reduced Word: 75-100% accurate Phrase: 75-100% accurate Sentence: 75-100% accurate Conversation: 75-100% accurate Motor Planning: Witnin functional limits Motor Speech Errors: Aware Effective Techniques: Over-articulate (slow rate)  See FIM for current functional status Refer to Care Plan for Long Term Goals  Recommendations for other services: None  Discharge Criteria: Patient will be discharged from SLP if patient refuses treatment 3 consecutive times without medical reason, if treatment goals not met, if there is a change in medical status, if  patient makes no progress towards goals or if patient is discharged from hospital.  The above assessment, treatment plan, treatment alternatives and goals were discussed and mutually agreed upon: by patient  Individual Therapy   Duron Meister 04/24/2011, 11:42 AM  .

## 2011-04-24 NOTE — Progress Notes (Signed)
Patient ID: Anna Lyons, female   DOB: 10-21-1955, 56 y.o.   MRN: [id number]   Physical Medicine and Rehabilitation   Anna Lyons is an 56 y.o. female.    Patient presents with    .   Code Stroke     C/o restless legs at night - new     HPI: 56 year old right-handed female with history of TIA in July 2012 as well as hypertension and tobacco abuse admitted March 19 with right-sided weakness and slurred speech. 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 Carotid Dopplers with no internal carotid artery stenosis. Echocardiogram with ejection fraction of 60% without wall motion abnormalities and negative for emboli. Cerebral angiogram with no occlusions or dissection noted. The patient did receive TPA. Neurology services consulted placed on aspirin therapy. Speech and occupational therapy treatments ongoing with recommendations for inpatient rehabilitation services as well as M.D. request physical medicine rehabilitation consult. Rehab felt the patient would benefit from CIR level therapies. Review of Systems   Gastrointestinal: Positive for constipation.   Neurological: Positive for dizziness and headaches.   Depression   All other systems reviewed and are negative   Past Medical History    Diagnosis   Date    .   Depression      .   Hypertension      .   Stroke          TIA in 08/2010        Past Surgical History    Procedure   Date    .   Mastectomy          bilateral with reconstruction in the 80s       History reviewed. No pertinent family history.   Social History: reports that she has been smoking Cigarettes. She has a 5 pack-year smoking history. She does not have any smokeless tobacco history on file. She reports that she drinks about .6 ounces of alcohol per week. She reports that she does not use illicit drugs.   Allergies:   Allergies    Allergen   Reactions     .   Codeine   Itching        Medications Prior to Admission    Medication   Dose   Route   Frequency   Provider   Last Rate   Last Dose    .   0.9 % sodium chloride infusion   250 mL   Intravenous   Once   Dione Booze, MD   250 mL/hr at 04/20/11 1449   250 mL at 04/20/11 1449    .   0.9 % sodium chloride infusion     Intravenous   Continuous   Oneal Grout, MD   75 mL/hr at 04/21/11 0125      .   acetaminophen (TYLENOL) tablet 650 mg   650 mg   Oral   Q4H PRN   Carmell Austria, MD     650 mg at 04/21/11 1450      Or    .   acetaminophen (TYLENOL) suppository 650 mg   650 mg   Rectal   Q4H PRN   Carmell Austria, MD        .   alteplase (ACTIVASE) 1 mg/mL infusion 64 mg   0.9 mg/kg (Order-Specific)   Intravenous   Once   Carmell Austria, MD  64 mg at 04/20/11 1446    .   aspirin EC tablet 325 mg   325 mg   Oral   Daily   Thana Farr, MD     325 mg at 04/23/11 1100    .   hydrochlorothiazide (HYDRODIURIL) tablet 50 mg   50 mg   Oral   Daily   Layne Benton, NP     50 mg at 04/23/11 1100    .   iohexol (OMNIPAQUE) 300 MG/ML solution 150 mL   150 mL   Intra-arterial   Once PRN   Oneal Grout, MD     50 mL at 04/20/11 1542    .   pantoprazole (PROTONIX) injection 40 mg   40 mg   Intravenous   QHS   Carmell Austria, MD     40 mg at 04/22/11 2303    .   rOPINIRole (REQUIP) tablet 0.5 mg   0.5 mg   Oral   Once   Noel Christmas     0.5 mg at 04/21/11 0018    .   rOPINIRole (REQUIP) tablet 0.5 mg   0.5 mg   Oral   QHS   Charles Stewart     0.5 mg at 04/22/11 2245    .   senna-docusate (Senokot-S) tablet 1 tablet   1 tablet   Oral   QHS PRN   Carmell Austria, MD        .   sertraline (ZOLOFT) tablet 50 mg   50 mg   Oral   Daily   Layne Benton, NP     50 mg at 04/23/11 1100    .   traMADol (ULTRAM) tablet 100 mg   100 mg   Oral   Q6H PRN   Layne Benton, NP     100 mg at 04/22/11 2303    .   DISCONTD: 0.9 % sodium chloride infusion     Intravenous   Continuous   Carmell Austria, MD        .   DISCONTD:  alteplase (ACTIVASE) 1 mg/mL infusion 0.9 mg/kg   0.9 mg/kg   Intravenous   Once   Dione Booze, MD        .   DISCONTD: labetalol (NORMODYNE,TRANDATE) injection 10 mg   10 mg   Intravenous   Q10 min PRN   Carmell Austria, MD        .   DISCONTD: ondansetron [facility]) injection 4 mg   4 mg   Intravenous   Q6H PRN   Carmell Austria, MD            No current outpatient prescriptions on file as of 04/23/2011.       Home:   Home Living   Lives With: Spouse   Receives Help From: Family (daughter)   Type of Home: House   Home Layout: Two level;Able to live on main level with bedroom/bathroom   Alternate Level Stairs-Rails: Right   Alternate Level Stairs-Number of Steps: 12   Home Access: Ramped entrance   Bathroom Shower/Tub: Tub only;Tub/shower unit;Curtain (tub only downstairs, tub/shower upstairs)   Bathroom Toilet: Standard   Bathroom Accessibility: Yes   How Accessible: Accessible via walker   Home Adaptive Equipment: Wheelchair - manual;Walker - rolling;Bedside commode/3-in-1;Tub transfer bench   Functional History:   Prior Function   Level of Independence: Independent with basic ADLs;Independent with gait;Independent with transfers   Able to Take Stairs?: Yes  Driving: Yes   Vocation: Full time employment   Vocation Requirements: Conservation officer, nature at [employer] Status:   Mobility:   Bed Mobility   Bed Mobility: Yes   Supine to Sit: 4: Min assist;HOB flat   Supine to Sit Details (indicate cue type and reason): focus on sequencing and positioning of right UE and LE for increased functional independence   Sitting - Scoot to [place] of Bed: 4: Min assist   Sitting - Scoot to [place] of Bed Details (indicate cue type and reason): decreased dissociation of trunk and pelvis with decreased grading of lateral weight shifts resulting in frequenct posterior right LOB   Sit to Supine: 4: Min assist;HOB flat   Sit to Supine - Details (indicate cue type and reason): focus on sequencing, verbal cues  for attention to right UE and positioning right UE and LE for increased functional independence   Transfers   Transfers: Yes   Sit to Stand: 3: Mod assist;With upper extremity assist;From bed   Sit to Stand Details (indicate cue type and reason): Manual facilitation for anterior weight right shift for symmetry and right LE placement. Posterior left lean with decreased weight bearing through right LE.   Stand to Sit: 3: Mod assist;With upper extremity assist;To bed   Stand to Sit Details: Posterior lean and decreased bilat LE eccentric control resulting in poorly controlled descent witout physical assist   Ambulation/Gait   Ambulation/Gait: Yes   Ambulation/Gait Assistance: 3: Mod assist   Ambulation/Gait Assistance Details (indicate cue type and reason): right hand held assist, manual facilitation at pelvis for increased grading of right lateral weight shift, increased right hip extension and trunk elongation in stance phase, and progression of center of gravity over BOS in right stance phase. Right LE fatigues quickly with intermittent buckling.   Ambulation Distance (Feet): 35 Feet   Assistive device: 1 person hand held assist   Gait Pattern: (decreased right foot clearance with intermittent step to)   Stairs: No   Wheelchair Mobility   Wheelchair Mobility: No   ADL:   ADL   Eating/Feeding: Performed;Set up   Eating/Feeding Details (indicate cue type and reason): setup to cut food and open containers   Where Assessed - Eating/Feeding: Bed level   Lower Body Dressing: Performed;Set up   Lower Body Dressing Details (indicate cue type and reason): Pt donne bil. slip on shoes using bil. UE and ankles crossed over legs.   Where Assessed - Lower Body Dressing: Sitting, bed   Toilet Transfer: Simulated;+2 Total assistance;Comment for patient % (70%)   Toilet Transfer Details (indicate cue type and reason): bil. HHA for steadying and balance. bed to chair.   Toilet Transfer Method: Counsellor: Other (comment) (chair)   Equipment Used: Other (comment) (bil. HHA)   Ambulation Related to ADLs: Pt with shuffled gait and Rt. knee buckling   ADL Comments: Pt encourage to incorporate use of Rt. UE during ADL activity. Anticipate pt will require min assist with UB ADLs due to decreased functional use of Rt. UE.   Cognition:   Cognition   Overall Cognitive Status: Appears within functional limits for tasks assessed   Arousal/Alertness: Awake/alert   Orientation Level: Oriented X4   Attention: Selective;Alternating   Selective Attention: Appears intact   Alternating Attention: Appears intact   Memory: Appears intact   Awareness: Appears intact   Problem Solving: Appears intact   Executive Function: Reasoning;Decision Making;Initiating;Self Monitoring   Reasoning: Appears intact  Decision Making: Appears intact   Initiating: Appears intact   Self Monitoring: Appears intact   Safety/Judgment: Appears intact   Cognition   Arousal/Alertness: Awake/alert   Overall Cognitive Status: Appears within functional limits for tasks assessed   Orientation Level: Oriented X4   BP 146/56  Pulse 59  Temp(Src) 97.6 F (36.4 C) (Oral)  Resp 20  Wt 143 lb 3.2 oz (64.955 kg)  SpO2 93%  Physical Exam  Vitals reviewed.   Constitutional: She is oriented to person, place, and time. She appears well-developed.   HENT:   Head: Normocephalic. PERRL, EOMI Neck: Normal range of motion. Neck supple. No thyromegaly present.   Cardiovascular: Normal rate.   Pulmonary/Chest: Breath sounds normal. She has no wheezes.   Abdominal: She exhibits no distension. There is no tenderness.  Musculoskeletal: She exhibits no edema.  Neurological: She is alert and oriented to person, place, and time.  Dysarthric speech but fully intelligible with right facial droop and tongue deviation Skin: Skin is warm and dry.  Psychiatric: She has a normal mood and affect.   NIH stroke scale is 4    Motor strength is 2 minus at the right deltoid. biceps, triceps, grip are near 3-4/5 with delayed activation. She does have underlying tone at 1+ in the right wrist flexors and elbow.  5/5 on the left side in the upper extremity.   Motor strength is 4/5 in the right hip flexor, knee extensors, ankle dorsiflexor and 5/5 in these muscles on the left   Sensation is intact to light touch. She is hyperreflexic at 3+ in the RUE and RLE. There is no evidence of tactile or visual extinction   Visual fields are intact confrontation testing   Cerebellar testing shows no evidence of ataxia   Cognitively she's intact with good insight and awareness. She has normal memory.      No results found for this or any previous visit (from the past 48 hour(s)).   Mr Brain Wo Contrast   04/21/2011 *RADIOLOGY REPORT* Clinical Data: Stroke. Status post t-PA. MRI HEAD WITHOUT CONTRAST Technique: Multiplanar, multiecho pulse sequences of the brain and surrounding structures were obtained according to standard protocol without intravenous contrast. Comparison: CT head without contrast 04/20/2011. Findings: An acute non hemorrhagic infarct is present within the left lentiform nucleus. There are more remote lacunar infarcts immediately adjacent to this area as well as a remote lacunar infarct in the right lentiform nucleus. Mild periventricular and scattered subcortical T2 and FLAIR hyperintensities otherwise are noted. Flow is present in the major intracranial arteries. No mass lesion is present. No focal hemorrhage is evident. The paranasal sinuses and left mastoid air cells are clear. There is fluid in the right mastoid air cells. No obstructing nasopharyngeal lesion is evident. IMPRESSION: 1. Acute non hemorrhagic infarct involving the posterior left lentiform nucleus. 2. More remote lacunar infarcts are present in the basal ganglia bilaterally. 3. Mild atrophy and white matter disease. This likely reflects the sequelae of  chronic microvascular ischemia. 4. Right mastoid effusion. No obstructing nasopharyngeal lesion is evident. Original Report Authenticated By: Jamesetta Orleans. MATTERN, M.D.     Post Admission Physician Evaluation:   Functional deficits secondary To a subcortical left brain infarct. Patient is admitted to receive collaborative, interdisciplinary care between the physiatrist, rehab nursing staff, and therapy team. Patient's level of medical complexity and substantial therapy needs in context of that medical necessity cannot be provided at a lesser intensity of care such as a SNF. Patient has experienced  substantial functional loss from his/her baseline which was documented above under the "Functional History" and "Functional Status" headings. Judging by the patient's diagnosis, physical exam, and functional history, the patient has potential for functional progress which will result in measurable gains while on inpatient rehab. These gains will be of substantial and practical use upon discharge in facilitating mobility and self-care at the household level. Physiatrist will provide 24 hour management of medical needs as well as oversight of the therapy plan/treatment and provide guidance as appropriate regarding the interaction of the two. 24 hour rehab nursing will assist with bladder management, bowel management, safety, skin/wound care, disease management, medication administration, pain management and patient education and help integrate therapy concepts, techniques,education, etc. PT will assess and treat for: Lower extremity strength, balance, NMR, safety, adaptive equipment training. Goals are: modified independent. OT will assess and treat for: UES, ADL's, functional mobility, safety, adaptive equipment training, family ed. Goals are: modified independent to supervision. SLP will assess and treat for:not applicable Case Management and Social Worker will assess and treat for psychological issues and  discharge planning. Team conference will be held weekly to assess progress toward goals and to determine barriers to discharge. Patient will receive at least 3 hours of therapy per day at least 5 days per week. ELOS and Prognosis: 7-10 days excellent   Medical Problem List and Plan:   1. left subcortical infarction      -pt has early hypertonia in the RUE. Work on ROM for now with therapies and during down time. She has reasonable underlying strength. 2. DVT Prophylaxis/Anticoagulation: SCDs. Monitor for any signs of deep vein thrombosis   3. Hypertension. Hydrochlorothiazide 50 mg daily. Monitor with increased mobility. Watch weights, i's and o's with diuretic on board. 4. Depression. Zoloft. Provide emotional support and positive reinforcement. Seems to be up beat and has a supportive family network. 5. Tobacco abuse. Consult to tobacco cessation counselor obtained while on acute care services  6. Restless legs. Start Requip at hs   Ivory Broad, MD 04/23/11 6. Pain management. Ultram as needed for intermittent headaches

## 2011-04-24 NOTE — Progress Notes (Signed)
Physical Therapy Note  Patient Details  Name: Anna Lyons MRN: [id number] Date of Birth: [birth year] Today's Date: 04/24/2011  Time: 758-853 55 minutes  No c/o pain.  Pt supervision with sitting balance to don socks and shoes with min A.  Good awareness and incorporation of R UE.  Gait training with HHA 50' with manual facilitation for wt shifts, cues for R knee and hip flexion to prevent foot drag.  Gait with min A 100'x2 manual facilitation at hips for timing of hip flexion to initiate swing phase on R.  Pt without LOB during gait even with turning, needs assist for wt shifts over R LE.  Stair training with 1 handrail 10 steps with min steadying assist and cues for technique.  NMR focusing on R LE stance phase and eccentric control.  Tap ups with L LE with min - mod A for R stance, pt tends to fall R when fatigued.  Step up/down forward and laterally with R LE with focus on eccentric control of quads, min A.  Pt with good activity tolerance and good motivation to participate in therapy.  Individual therapy   Johnathan Heskett 04/24/2011, 8:54 AM

## 2011-04-25 NOTE — Evaluation (Addendum)
Occupational Therapy Assessment and Plan  Patient Details  Name: Anna Lyons MRN: [id number] Date of Birth: July 29, 1955  OT Diagnosis: hemiplegia affecting dominant side Rehab Potential:  excellent ELOS:   10-14 days  Today's Date: 04/24/2011 Time:  - 1st session:   1100-1200  (60 min-- OT eval)                 2nd session:  1400-1445  (45 minutes)  Problem List:  Patient Active Problem List  Diagnoses  . Depression  . CVA (cerebral infarction)    Past Medical History:  Past Medical History  Diagnosis Date  . Depression   . Hypertension   . Stroke     TIA in 08/2010   Past Surgical History:  Past Surgical History  Procedure Date  . Mastectomy     bilateral with reconstruction in the 80s    Assessment & Plan Clinical Impression: Patient is a 56 y.o. year old female with recent admission to the hospital on March 19 right sided weakness.  Patient transferred to CIR on 04/23/2011 .    Patient currently requires mod with basic self-care skills secondary to impaired timing and sequencing and decreased coordination.  Prior to hospitalization, patient could complete BADL with no assistance.  Patient will benefit from skilled intervention to increase independence with basic self-care skills and increase level of independence with iADL prior to discharge home with care partner.  Anticipate patient will require intermittent supervision and follow up home health.     OT Evaluation Precautions/Restrictions Right sided weakness  OT eval:  1st session;  2nd session:  Pt. Performed function mobility with RW to bathroom and at sink.  Utilized RUE as active assist with gross movements, but more difficult with fine motor     Pain:  none   Home Living/Prior Functioning Home Living Lives With: Spouse Receives Help From: Family Type of Home: House Home Layout: Two level;Able to live on main level with bedroom/bathroom Alternate Level Stairs-Rails: Right Alternate Level Stairs-Number  of Steps: full flight Home Access: Ramped entrance Bathroom Shower/Tub: Tub only;Tub/shower unit;Curtain Teacher, early years/pre: Yes How Accessible: Accessible via walker Home Adaptive Equipment: Bedside commode/3-in-1;Straight cane;Tub transfer bench;Wheelchair - manual Prior Function Level of Independence: Independent with basic ADLs;Independent with homemaking with ambulation;Independent with gait;Independent with transfers Able to Take Stairs?: Yes Driving: Yes 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[employer]) Leisure: Hobbies-yes (Comment) Comments: enjoys spending time with her 3-year-old grandson ADL ADL Eating: Independent Where Assessed-Eating: Chair Grooming: Minimal assistance Where Assessed-Grooming: Sitting at sink Upper Body Bathing: Minimal assistance Where Assessed-Upper Body Bathing: Wheelchair Lower Body Bathing: Minimal assistance Where Assessed-Lower Body Bathing: Chair Upper Body Dressing: Minimal cueing Where Assessed-Upper Body Dressing: Sitting at sink;Standing at sink Lower Body Dressing: Moderate assistance Where Assessed-Lower Body Dressing: Sitting at sink;Standing at sink Toileting: Minimal assistance Where Assessed-Toileting: Teacher, adult education: Curator Method: Surveyor, minerals: Acupuncturist: Insurance underwriter Method: Designer, industrial/product: Transfer tub bench;Grab bars Vision/Perception:  Right inattention    Cognition: baseline; wfl   Sensation: intact   Motor :  Right sided weakness   Mobility minimal assist with RW    Trunk/Postural Assessment :  WFL    Balance: minimal assist with functional mobility   Extremity/Trunk Assessment:  wfl      See FIM for current functional status Refer to Care Plan for Long Term Goals  Recommendations for other services: None  Discharge Criteria:  Patient will be discharged from OT if patient refuses treatment 3 consecutive times without medical reason, if treatment goals not met, if there is a change in medical status, if patient makes no progress towards goals or if patient is discharged from hospital.  The above assessment, treatment plan, treatment alternatives and goals were discussed and mutually agreed upon: by family.  Pt. Adm. To hospital on April 20, 2011.    Humberto Seals 04/25/2011, 5:03 PM

## 2011-04-25 NOTE — Progress Notes (Signed)
Physical Therapy Note  Patient Details  Name: Anna Lyons MRN: [id number] Date of Birth: 1955-09-10 Today's Date: 04/25/2011 Time:  1345-1430:  Individual session  Educated daughter on walker safety with pt around room and out in hallway.  Daughter ambulated pt to bathroom and then out in hall with good understanding and proper hands on position for steadying pt if needed.  Engaged in right upper extremity movement in standing using checker game.  Pt complained of the bicep muscle pain with palpation but not with movement.     Humberto Seals 04/25/2011, 5:23 PM

## 2011-04-25 NOTE — Progress Notes (Signed)
Patient ID: ROBERTA ANGELL, female   DOB: 1955/11/07, 56 y.o.   MRN: [id number] Patient ID: FREDRICA CAPANO, female   DOB: October 14, 1955, 56 y.o.   MRN: [id number]   Physical Medicine and Rehabilitation   KHRISTEN CHEYNEY is an 57 y.o. female.    Patient presents with    .   Code Stroke     C/o restless legs at night - new; slept well - new med helped     HPI: 56 year old right-handed female with history of TIA in July 2012 as well as hypertension and tobacco abuse admitted March 19 with right-sided weakness and slurred speech. 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 Carotid Dopplers with no internal carotid artery stenosis. Echocardiogram with ejection fraction of 60% without wall motion abnormalities and negative for emboli. Cerebral angiogram with no occlusions or dissection noted. The patient did receive TPA. Neurology services consulted placed on aspirin therapy. Speech and occupational therapy treatments ongoing with recommendations for inpatient rehabilitation services as well as M.D. request physical medicine rehabilitation consult. Rehab felt the patient would benefit from CIR level therapies. Review of Systems   Gastrointestinal: Positive for constipation.   Neurological: Positive for dizziness and headaches.   Depression   All other systems reviewed and are negative   Past Medical History    Diagnosis   Date    .   Depression      .   Hypertension      .   Stroke          TIA in 08/2010        Past Surgical History    Procedure   Date    .   Mastectomy          bilateral with reconstruction in the 80s       History reviewed. No pertinent family history.   Social History: reports that she has been smoking Cigarettes. She has a 5 pack-year smoking history. She does not have any smokeless tobacco history on file. She reports that she drinks about .6 ounces of alcohol per  week. She reports that she does not use illicit drugs.   Allergies:   Allergies    Allergen   Reactions    .   Codeine   Itching        Medications Prior to Admission    Medication   Dose   Route   Frequency   Provider   Last Rate   Last Dose    .   0.9 % sodium chloride infusion   250 mL   Intravenous   Once   Dione Booze, MD   250 mL/hr at 04/20/11 1449   250 mL at 04/20/11 1449    .   0.9 % sodium chloride infusion     Intravenous   Continuous   Oneal Grout, MD   75 mL/hr at 04/21/11 0125      .   acetaminophen (TYLENOL) tablet 650 mg   650 mg   Oral   Q4H PRN   Carmell Austria, MD     650 mg at 04/21/11 1450      Or    .   acetaminophen (TYLENOL) suppository 650 mg   650 mg   Rectal   Q4H PRN   Carmell Austria, MD        .   alteplase (ACTIVASE) 1 mg/mL infusion  64 mg   0.9 mg/kg (Order-Specific)   Intravenous   Once   Carmell Austria, MD     64 mg at 04/20/11 1446    .   aspirin EC tablet 325 mg   325 mg   Oral   Daily   Thana Farr, MD     325 mg at 04/23/11 1100    .   hydrochlorothiazide (HYDRODIURIL) tablet 50 mg   50 mg   Oral   Daily   Layne Benton, NP     50 mg at 04/23/11 1100    .   iohexol (OMNIPAQUE) 300 MG/ML solution 150 mL   150 mL   Intra-arterial   Once PRN   Oneal Grout, MD     50 mL at 04/20/11 1542    .   pantoprazole (PROTONIX) injection 40 mg   40 mg   Intravenous   QHS   Carmell Austria, MD     40 mg at 04/22/11 2303    .   rOPINIRole (REQUIP) tablet 0.5 mg   0.5 mg   Oral   Once   Noel Christmas     0.5 mg at 04/21/11 0018    .   rOPINIRole (REQUIP) tablet 0.5 mg   0.5 mg   Oral   QHS   Charles Stewart     0.5 mg at 04/22/11 2245    .   senna-docusate (Senokot-S) tablet 1 tablet   1 tablet   Oral   QHS PRN   Carmell Austria, MD        .   sertraline (ZOLOFT) tablet 50 mg   50 mg   Oral   Daily   Layne Benton, NP     50 mg at 04/23/11 1100    .   traMADol (ULTRAM) tablet 100 mg   100 mg   Oral   Q6H PRN   Layne Benton, NP     100 mg at 04/22/11 2303    .    DISCONTD: 0.9 % sodium chloride infusion     Intravenous   Continuous   Carmell Austria, MD        .   DISCONTD: alteplase (ACTIVASE) 1 mg/mL infusion 0.9 mg/kg   0.9 mg/kg   Intravenous   Once   Dione Booze, MD        .   DISCONTD: labetalol (NORMODYNE,TRANDATE) injection 10 mg   10 mg   Intravenous   Q10 min PRN   Carmell Austria, MD        .   DISCONTD: ondansetron [facility]) injection 4 mg   4 mg   Intravenous   Q6H PRN   Carmell Austria, MD            No current outpatient prescriptions on file as of 04/23/2011.       Home:   Home Living   Lives With: Spouse   Receives Help From: Family (daughter)   Type of Home: House   Home Layout: Two level;Able to live on main level with bedroom/bathroom   Alternate Level Stairs-Rails: Right   Alternate Level Stairs-Number of Steps: 12   Home Access: Ramped entrance   Bathroom Shower/Tub: Tub only;Tub/shower unit;Curtain (tub only downstairs, tub/shower upstairs)   Bathroom Toilet: Standard   Bathroom Accessibility: Yes   How Accessible: Accessible via walker   Home Adaptive Equipment: Wheelchair - manual;Walker - rolling;Bedside commode/3-in-1;Tub transfer bench   Functional History:   Prior Function  Level of Independence: Independent with basic ADLs;Independent with gait;Independent with transfers   Able to Take Stairs?: Yes   Driving: Yes   Vocation: Full time employment   Vocation Requirements: Conservation officer, nature at [employer] Status:   Mobility:   Bed Mobility   Bed Mobility: Yes   Supine to Sit: 4: Min assist;HOB flat   Supine to Sit Details (indicate cue type and reason): focus on sequencing and positioning of right UE and LE for increased functional independence   Sitting - Scoot to [place] of Bed: 4: Min assist   Sitting - Scoot to [place] of Bed Details (indicate cue type and reason): decreased dissociation of trunk and pelvis with decreased grading of lateral weight shifts resulting in frequenct posterior right LOB   Sit to Supine: 4:  Min assist;HOB flat   Sit to Supine - Details (indicate cue type and reason): focus on sequencing, verbal cues for attention to right UE and positioning right UE and LE for increased functional independence   Transfers   Transfers: Yes   Sit to Stand: 3: Mod assist;With upper extremity assist;From bed   Sit to Stand Details (indicate cue type and reason): Manual facilitation for anterior weight right shift for symmetry and right LE placement. Posterior left lean with decreased weight bearing through right LE.   Stand to Sit: 3: Mod assist;With upper extremity assist;To bed   Stand to Sit Details: Posterior lean and decreased bilat LE eccentric control resulting in poorly controlled descent witout physical assist   Ambulation/Gait   Ambulation/Gait: Yes   Ambulation/Gait Assistance: 3: Mod assist   Ambulation/Gait Assistance Details (indicate cue type and reason): right hand held assist, manual facilitation at pelvis for increased grading of right lateral weight shift, increased right hip extension and trunk elongation in stance phase, and progression of center of gravity over BOS in right stance phase. Right LE fatigues quickly with intermittent buckling.   Ambulation Distance (Feet): 35 Feet   Assistive device: 1 person hand held assist   Gait Pattern: (decreased right foot clearance with intermittent step to)   Stairs: No   Wheelchair Mobility   Wheelchair Mobility: No   ADL:   ADL   Eating/Feeding: Performed;Set up   Eating/Feeding Details (indicate cue type and reason): setup to cut food and open containers   Where Assessed - Eating/Feeding: Bed level   Lower Body Dressing: Performed;Set up   Lower Body Dressing Details (indicate cue type and reason): Pt donne bil. slip on shoes using bil. UE and ankles crossed over legs.   Where Assessed - Lower Body Dressing: Sitting, bed   Toilet Transfer: Simulated;+2 Total assistance;Comment for patient % (70%)   Toilet Transfer Details (indicate  cue type and reason): bil. HHA for steadying and balance. bed to chair.   Toilet Transfer Method: Armed forces operational officer: Other (comment) (chair)   Equipment Used: Other (comment) (bil. HHA)   Ambulation Related to ADLs: Pt with shuffled gait and Rt. knee buckling   ADL Comments: Pt encourage to incorporate use of Rt. UE during ADL activity. Anticipate pt will require min assist with UB ADLs due to decreased functional use of Rt. UE.   Cognition:   Cognition   Overall Cognitive Status: Appears within functional limits for tasks assessed   Arousal/Alertness: Awake/alert   Orientation Level: Oriented X4   Attention: Selective;Alternating   Selective Attention: Appears intact   Alternating Attention: Appears intact   Memory: Appears intact   Awareness: Appears  intact   Problem Solving: Appears intact   Executive Function: Reasoning;Decision Making;Initiating;Self Monitoring   Reasoning: Appears intact   Decision Making: Appears intact   Initiating: Appears intact   Self Monitoring: Appears intact   Safety/Judgment: Appears intact   Cognition   Arousal/Alertness: Awake/alert   Overall Cognitive Status: Appears within functional limits for tasks assessed   Orientation Level: Oriented X4   BP 124/69  Pulse 57  Temp(Src) 98.6 F (37 C) (Oral)  Resp 20  Wt 143 lb 3.2 oz (64.955 kg)  SpO2 95%  Physical Exam  Vitals reviewed.   Constitutional: She is oriented to person, place, and time. She appears well-developed.   HENT:   Head: Normocephalic. PERRL, EOMI Neck: Normal range of motion. Neck supple. No thyromegaly present.   Cardiovascular: Normal rate.   Pulmonary/Chest: Breath sounds normal. She has no wheezes.   Abdominal: She exhibits no distension. There is no tenderness.  Musculoskeletal: She exhibits no edema.  Neurological: She is alert and oriented to person, place, and time.  Dysarthric speech but fully intelligible with right facial droop and tongue  deviation Skin: Skin is warm and dry.  Psychiatric: She has a normal mood and affect.   NIH stroke scale is 4   Motor strength is 2 minus at the right deltoid. biceps, triceps, grip are near 3-4/5 with delayed activation. She does have underlying tone at 1+ in the right wrist flexors and elbow.  5/5 on the left side in the upper extremity.   Motor strength is 4/5 in the right hip flexor, knee extensors, ankle dorsiflexor and 5/5 in these muscles on the left   Sensation is intact to light touch. She is hyperreflexic at 3+ in the RUE and RLE. There is no evidence of tactile or visual extinction   Visual fields are intact confrontation testing   Cerebellar testing shows no evidence of ataxia   Cognitively she's intact with good insight and awareness. She has normal memory.      No results found for this or any previous visit (from the past 48 hour(s)).   Mr Brain Wo Contrast   04/21/2011 *RADIOLOGY REPORT* Clinical Data: Stroke. Status post t-PA. MRI HEAD WITHOUT CONTRAST Technique: Multiplanar, multiecho pulse sequences of the brain and surrounding structures were obtained according to standard protocol without intravenous contrast. Comparison: CT head without contrast 04/20/2011. Findings: An acute non hemorrhagic infarct is present within the left lentiform nucleus. There are more remote lacunar infarcts immediately adjacent to this area as well as a remote lacunar infarct in the right lentiform nucleus. Mild periventricular and scattered subcortical T2 and FLAIR hyperintensities otherwise are noted. Flow is present in the major intracranial arteries. No mass lesion is present. No focal hemorrhage is evident. The paranasal sinuses and left mastoid air cells are clear. There is fluid in the right mastoid air cells. No obstructing nasopharyngeal lesion is evident. IMPRESSION: 1. Acute non hemorrhagic infarct involving the posterior left lentiform nucleus. 2. More remote lacunar infarcts are present in  the basal ganglia bilaterally. 3. Mild atrophy and white matter disease. This likely reflects the sequelae of chronic microvascular ischemia. 4. Right mastoid effusion. No obstructing nasopharyngeal lesion is evident. Original Report Authenticated By: Jamesetta Orleans. MATTERN, M.D.     Post Admission Physician Evaluation:   Functional deficits secondary To a subcortical left brain infarct. Patient is admitted to receive collaborative, interdisciplinary care between the physiatrist, rehab nursing staff, and therapy team. Patient's level of medical complexity and substantial therapy needs in  context of that medical necessity cannot be provided at a lesser intensity of care such as a SNF. Patient has experienced substantial functional loss from his/her baseline which was documented above under the "Functional History" and "Functional Status" headings. Judging by the patient's diagnosis, physical exam, and functional history, the patient has potential for functional progress which will result in measurable gains while on inpatient rehab. These gains will be of substantial and practical use upon discharge in facilitating mobility and self-care at the household level. Physiatrist will provide 24 hour management of medical needs as well as oversight of the therapy plan/treatment and provide guidance as appropriate regarding the interaction of the two. 24 hour rehab nursing will assist with bladder management, bowel management, safety, skin/wound care, disease management, medication administration, pain management and patient education and help integrate therapy concepts, techniques,education, etc. PT will assess and treat for: Lower extremity strength, balance, NMR, safety, adaptive equipment training. Goals are: modified independent. OT will assess and treat for: UES, ADL's, functional mobility, safety, adaptive equipment training, family ed. Goals are: modified independent to supervision. SLP will assess and  treat for:not applicable Case Management and Social Worker will assess and treat for psychological issues and discharge planning. Team conference will be held weekly to assess progress toward goals and to determine barriers to discharge. Patient will receive at least 3 hours of therapy per day at least 5 days per week. ELOS and Prognosis: 7-10 days excellent   Medical Problem List and Plan:   1. left subcortical infarction      -pt has early hypertonia in the RUE. Work on ROM for now with therapies and during down time. She has reasonable underlying strength. 2. DVT Prophylaxis/Anticoagulation: SCDs. Monitor for any signs of deep vein thrombosis   3. Hypertension. Hydrochlorothiazide 50 mg daily. Monitor with increased mobility. Watch weights, i's and o's with diuretic on board. 4. Depression. Zoloft. Provide emotional support and positive reinforcement. Seems to be up beat and has a supportive family network. 5. Tobacco abuse. Consult to tobacco cessation counselor obtained while on acute care services  6. Restless legs. Start Requip at hs - it is helping   Ivory Broad, MD 04/23/11 6. Pain management. Ultram as needed for intermittent headaches

## 2011-04-26 ENCOUNTER — Inpatient Hospital Stay (HOSPITAL_COMMUNITY): Payer: Self-pay

## 2011-04-26 LAB — DIFFERENTIAL
Basophils Absolute: 0 10*3/uL (ref 0.0–0.1)
Basophils Relative: 0 % (ref 0–1)
Eosinophils Relative: 1 % (ref 0–5)
Lymphocytes Relative: 21 % (ref 12–46)
Monocytes Absolute: 0.6 10*3/uL (ref 0.1–1.0)
Neutro Abs: 3.4 10*3/uL (ref 1.7–7.7)

## 2011-04-26 LAB — COMPREHENSIVE METABOLIC PANEL
AST: 26 U/L (ref 0–37)
CO2: 28 mEq/L (ref 19–32)
Calcium: 10.2 mg/dL (ref 8.4–10.5)
Chloride: 93 mEq/L — ABNORMAL LOW (ref 96–112)
Creatinine, Ser: 0.56 mg/dL (ref 0.50–1.10)
GFR calc Af Amer: >90 mL/min (ref >90)
GFR calc non Af Amer: >90 mL/min (ref >90)
Glucose, Bld: 118 mg/dL — ABNORMAL HIGH (ref 70–99)
Total Bilirubin: 0.5 mg/dL (ref 0.3–1.2)

## 2011-04-26 LAB — CBC
HCT: 40.5 % (ref 36.0–46.0)
Hemoglobin: 14 g/dL (ref 12.0–15.0)
MCH: 34 pg (ref 26.0–34.0)
MCV: 98.3 fL (ref 78.0–100.0)
RBC: 4.12 MIL/uL (ref 3.87–5.11)
WBC: 5.1 10*3/uL (ref 4.0–10.5)

## 2011-04-26 MED ORDER — POTASSIUM CHLORIDE CRYS ER 20 MEQ PO TBCR
20.0000 meq | EXTENDED_RELEASE_TABLET | Freq: Two times a day (BID) | ORAL | Status: DC
  Administered 2011-04-26 – 2011-04-27 (×4): 20 meq via ORAL
  Filled 2011-04-26 (×7): qty 1

## 2011-04-26 NOTE — Progress Notes (Signed)
Patient ID: Anna Lyons, female   DOB: 1955-04-13, 56 y.o.   MRN: [id number]  Subjective/Complaints:   Objective: Vital Signs: Blood pressure 123/74, pulse 57, temperature 98.4 F (36.9 C), temperature source Oral, resp. rate 18, weight 64.955 kg (143 lb 3.2 oz), SpO2 95.00%. No results found. No results found for this or any previous visit (from the past 72 hour(s)).   Gen: NAD HEENT: + HOH, +Dysarthria Lungs:  Clear Cor:  RRR no M Abd:+BS soft NT Ext - C/C/E Motor:  3/5 R delt bi, tri,grip 3/5 R HF, KE, Ankle DF Sensory intact MSK Pain over bicipital groove Pain to palp over R foot/ankle , -pain with ROM, no erythema   Assessment/Plan: 1. Functional deficits secondary to L basal ganglia infarct which require 3+ hours per day of interdisciplinary therapy in a comprehensive inpatient rehab setting. Physiatrist is providing close team supervision and 24 hour management of active medical problems listed below. Physiatrist and rehab team continue to assess barriers to discharge/monitor patient progress toward functional and medical goals. FIM: FIM - Bathing Bathing Steps Patient Completed: Chest;Right Arm;Left Arm;Abdomen;Front perineal area;Right upper leg;Left upper leg;Right lower leg (including foot);Left lower leg (including foot) Bathing: 4: Min-Patient completes 8-9 47f 10 parts or 75+ percent  FIM - Upper Body Dressing/Undressing Upper body dressing/undressing steps patient completed: Thread/unthread right sleeve of pullover shirt/dresss;Thread/unthread left sleeve of pullover shirt/dress;Put head through opening of pull over shirt/dress;Pull shirt over trunk Upper body dressing/undressing: 4: Steadying assist FIM - Lower Body Dressing/Undressing Lower body dressing/undressing steps patient completed: Thread/unthread right underwear leg;Thread/unthread left underwear leg;Pull underwear up/down;Thread/unthread right pants leg;Thread/unthread left pants leg;Pull pants  up/down;Don/Doff right sock;Don/Doff left sock Lower body dressing/undressing: 5: Set-up assist to: Obtain clothing  FIM - Toileting Toileting steps completed by patient: Adjust clothing prior to toileting;Performs perineal hygiene;Adjust clothing after toileting Toileting Assistive Devices: Grab bar or rail for support Toileting: 5: Supervision: Safety issues/verbal cues  FIM - Diplomatic Services operational officer Devices: Grab bars Toilet Transfers: 5-From toilet/BSC: Supervision (verbal cues/safety issues);5-To toilet/BSC: Supervision (verbal cues/safety issues)  FIM - Bed/Chair Transfer Bed/Chair Transfer: 4: Supine > Sit: Min A (steadying Pt. > 75%/lift 1 leg);4: Bed > Chair or W/C: Min A (steadying Pt. > 75%)  FIM - Locomotion: Wheelchair Distance: 35 Locomotion: Wheelchair: 1: Total Assistance/staff pushes wheelchair (Pt<25%) FIM - Locomotion: Ambulation Locomotion: Ambulation Assistive Devices: Designer, industrial/product Ambulation/Gait Assistance: 5: Supervision Locomotion: Ambulation: 2: Travels 50 - 149 ft with minimal assistance (Pt.>75%)  Comprehension Comprehension Mode: Auditory Comprehension: 6-Follows complex conversation/direction: With extra time/assistive device  Expression Expression Mode: Verbal Expression: 6-Expresses complex ideas: With extra time/assistive device  Social Interaction Social Interaction: 6-Interacts appropriately with others with medication or extra time (anti-anxiety, antidepressant).  Problem Solving Problem Solving: 5-Solves basic 90% of the time/requires cueing < 10% of the time  Memory Memory: 5-Recognizes or recalls 90% of the time/requires cueing < 10% of the time  Medical Problem List and Plan:  1. 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 Work on ROM for now with therapies and during down time. She has reasonable underlying strength.  2. DVT Prophylaxis/Anticoagulation: SCDs. Monitor for any  signs of deep vein thrombosis  3. Hypertension. Hydrochlorothiazide 50 mg daily. Monitor with increased mobility. Watch weights, i's and o's with diuretic on board.  4. Depression. Zoloft. Provide emotional support and positive reinforcement. Seems to be up beat and has a supportive family network.  5. Tobacco abuse. Consult to tobacco cessation  counselor obtained while on acute care services  6.  R bicipital tendinitis vs shoulder OA 7.  R foot pain - exam likely metatarsalgia  LOS (Days) 3 A FACE TO FACE EVALUATION WAS PERFORMED  Aidah Forquer E 04/26/2011, 7:29 AM     Review of Systems  Musculoskeletal: Positive for joint pain.       R shoulder and R anterior foot

## 2011-04-26 NOTE — Progress Notes (Signed)
Social Work Assessment and Plan Social Work Assessment and Plan  Patient Details  Name: Anna Lyons MRN: [id number] Date of Birth: 1955/12/17  Today's Date: 04/26/2011  Problem List:  Patient Active Problem List  Diagnoses  . Depression  . CVA (cerebral infarction)   Past Medical History:  Past Medical History  Diagnosis Date  . Depression   . Hypertension   . Stroke     TIA in 08/2010   Past Surgical History:  Past Surgical History  Procedure Date  . Mastectomy     bilateral with reconstruction in the 80s   Social History:  reports that she has been smoking Cigarettes.  She has a 5 pack-year smoking history. She does not have any smokeless tobacco history on file. She reports that she drinks about .6 ounces of alcohol per week. She reports that she does not use illicit drugs.  Family / Support Systems Marital Status: Married Patient Roles: Spouse;Parent;Other (Comment) (Employee) Spouse/Significant Other: James-husband [phone]  [phone] Children: Dawn-Daughter  [phone] Other Supports: Friends and Church Members Anticipated Caregiver: Husband and daughter Ability/Limitations of Caregiver: Husband has a history of CVA ambulates with a cane, daughter can come by daily to check on both. Caregiver Availability: 24/7 Family Dynamics: Very close small family that has been there for one another.  Husband a pt on rehab 2012 and wife provided care for him.  Daughter very involved with both.  Social History Preferred language: English Religion: Non-Denominational Cultural Background: No issues Education: High School Read: Yes Write: Yes Employment Status: Employed Designer, television/film set) Name of Employer: [employer] Return to Work Plans: Wants to return had only been there for three weeks when this happened Fish farm manager Issues: No issues Guardian/Conservator: None   Abuse/Neglect Physical Abuse: Denies Verbal Abuse: Denies Sexual Abuse: Denies Exploitation of  patient/patient's resources: Denies Self-Neglect: Denies  Emotional Status Pt's affect, behavior adn adjustment status: Pt is motivated to get as independent as possible.  She wants to get back to work.  She went through this with her husband last year and knows the process. Recent Psychosocial Issues: Other medical issues Pyschiatric History: No history-deferred Depression Screen due to coping appropriately with stroke.  Will continue to monitor her coping.  She is very expressive with her concerns and issues. Substance Abuse History: Tobacco-plans to quit now. Has information regarding quitting.  Patient / Family Perceptions, Expectations & Goals Pt/Family understanding of illness & functional limitations: Pt and husband are well versed regarding CVA.  Husband realized wife was having a stroke and took her to the doctor's.  Both have a good understanding of her condition. Premorbid pt/family roles/activities: Wife, Mother, Emplyoee, Church Member, Etc Anticipated changes in roles/activities/participation: Plans to resume at discharge Pt/family expectations/goals: Pt states: " I want to be as independent as possible I want to get back to [employer]."  Husband reports: " I want her to do well here, I know she can."  [employer]: None Premorbid Home Care/DME Agencies: None Transportation available at discharge: [employer] referrals recommended: Support group (specify) (CVA Support Group)  Discharge Planning Support Systems: Spouse/significant other;Children;Church/faith community Type of Residence: Private residence Insurance Resources: Customer service manager Resources: Employment;Family Support Financial Screen Referred: Yes Living Expenses: Lives with family Money Management: Patient;Spouse Do you have any problems obtaining your medications?: No Home Management: Both usually pt Patient/Family Preliminary Plans: Return home with husband and daughter to assist.   Aware of rehab and were here last year.  Doing well high level. Social Work Anticipated  Follow Up Needs: HH/OP;Support Group (CVA Support Group) Expected length of stay: Short length of stay due to high level and private pay  Clinical Impression Very nice woman who is a strong support to husband when here on rehab last year.  Pt is high level and making good progress.  Provide support and assist with plans. Daughter applying for SSD and Medicaid this week.  Lucy Chris 04/26/2011, 1:42 PM

## 2011-04-26 NOTE — Progress Notes (Signed)
Physical Therapy Session Note  Patient Details  Name: Anna Lyons MRN: [id number] Date of Birth: [birth year]  Today's Date: 04/26/2011 Time: 7829-5621 Time Calculation (min): 58 min  Short Term Goals: Week 1:  PT Short Term Goal 1 (Week 1): STGs = LTGs due to short length of stay. Modified independent overall with supervision for stair negotiation. PT Short Term Goal 1 - Progress (Week 1): Progressing toward goal  Therapy Documentation Precautions:  Precautions Precautions: Fall Required Braces or Orthoses: No Restrictions Weight Bearing Restrictions: No Other Position/Activity Restrictions: dominant right side hemiplegia Pain: Pain Assessment Pain Assessment: No/denies pain Pain Score: 0-No pain Locomotion : Ambulation Ambulation: Yes Ambulation/Gait Assistance: 5: Supervision;4: Min assist Ambulation Distance (Feet): 150 Feet Assistive device: Rolling walker;None Ambulation/Gait Assistance Details: Verbal cues for precautions/safety;Verbal cues for sequencing Ambulation/Gait Assistance Details (indicate cue type and reason): Gait training in controlled environment with RW and supervision; patient reports that they have a ramp to enter their house; practiced up and down ramp and curb x 2 reps with RW and x 2 reps without AD with min A and verbal cues for sequence. 1 or 2 episodes of R foot drag as patient fatigued.   High Level Ambulation High Level Ambulation: Other high level ambulation High Level Ambulation - Other Comments: Performed tandem walking for higher level gait and balance challenge with more narrow BOS, ankle training and anterior translation of COG over BOS x 50' with min A for balance Stairs / Additional Locomotion Stairs: Yes Stairs Assistance: 4: Min assist Stairs Assistance Details: Visual cues/gestures for precautions/safety;Verbal cues for precautions/safety;Verbal cues for sequencing Stairs Assistance Details (indicate cue type and reason): Patient reports  that she has full flight of stairs to reach bathroom with shower; performed stair training up and down 2 flights of stairs with R rail and min A with verbal cues for sequence and safety; patient able to perform with alternating pattern; 1-2 episodes of R foot catching on step as patient fatigued.   Stair Management Technique: One rail Right;Alternating pattern;Forwards Number of Stairs: 24  Height of Stairs: 8    Balance: Balance Balance Assessed: Yes Dynamic Standing Balance Dynamic Standing - Balance Support: Bilateral upper extremity supported;No upper extremity supported Dynamic Standing - Level of Assistance: 4: Min assist Dynamic Standing - Balance Activities: Compliant surfaces Dynamic Standing - Comments: During 10 reps sit to stand from mat without UE support with eccentric training during controlled stand to sit and isometric training with 10 second squat and hold; performed single limb stance beginning with bilat UE support x 30 sec x 2 reps >> one UE support x 30 sec x 2 reps each LE >> no UE support x 10 seconds x 2-3 reps per LE with increased tactile and verbal cues for activation of RLE glutes and quads for stability.  See FIM for current functional status  Therapy/Group: Individual Therapy  Edman Circle [facility] 04/26/2011, 4:29 PM

## 2011-04-26 NOTE — Progress Notes (Signed)
Occupational Therapy Session Note  Patient Details  Name: Anna Lyons MRN: [id number] Date of Birth: [birth year]  Today's Date: 04/26/2011 Time: 1000-1100 Time Calculation (min): 60 min  Short Term Goals: Week 1:  OT Short Term Goal 1 (Week 1): 1.  Pt. will be mod I with bathing. OT Short Term Goal 2 (Week 1): 2.  Pt. will be mod I with dressing OT Short Term Goal 3 (Week 1): 3.  Pt. will be Miod I with toileting/ toilet transfer OT Short Term Goal 4 (Week 1): 4.  pt. will be supervision with tub shower transfer  Skilled Therapeutic Interventions/Progress Updates: Pt seen for ADL retraining of B/D at sink level, per pt choice with focus on completing tasks from standing position.  Pt donned pants over feet from standing with one hand steadying self on walker.  Pt used RUE as an active assist distally for all self care tasks.  Pt then seen in gym to focus on RUE neuro re-ed to facilitate improved scapular active movement.  Pt has shoulder pain with shoulder flexion over 90 due to decreased scapulohumeral rhythm.  Pt worked on gravity eliminated exercises in supine and sidelying.    Therapy Documentation Precautions:  Precautions Precautions: Fall Required Braces or Orthoses: No Restrictions Weight Bearing Restrictions: No Other Position/Activity Restrictions: dominant right side hemiplegia Pain: Pain Assessment Pain Assessment: No/denies pain See FIM for current functional status  Therapy/Group: Individual Therapy  [name] 04/26/2011, 11:59 AM

## 2011-04-26 NOTE — Progress Notes (Signed)
Physical Therapy Note  Patient Details  Name: Anna Lyons MRN: [id number] Date of Birth: [birth year] Today's Date: 04/26/2011  1130-1155 (25 minutes) individual Pain- no complaint of pain Focus of treatment: gait training/endurance with/without AD; therapeutic activities for strengthening Rt LE in stance and facilitate weight shifts challenging dynamic standing balance Treatment: Gait 120 feet RW close supervision room> gym; Kinetron in standing with/without UE support SBA; gait without AD gym > room close supervision with vcs to facilitate heel/toe gait on right.   Celeste Tavenner,JIM 04/26/2011, 11:56 AM

## 2011-04-26 NOTE — Progress Notes (Addendum)
Results for Anna Lyons, Anna Lyons (MRN [id number]) as of 04/26/2011 11:48  Ref. Range 04/26/2011 09:49  Sodium Latest Range: 135-145 mEq/L 134 (L)  Potassium Latest Range: 3.5-5.1 mEq/L 2.8 (L)  Chloride Latest Range: 96-112 mEq/L 93 (L)  CO2 Latest Range: 19-32 mEq/L 28  BUN Latest Range: 6-23 mg/dL 18  Pt had labs drawn this am, results noted as above. Dan Angiulli Pa made aware of results , no new orders at this time. Will  Continue to monitor. Roberts-VonCannon, Rollyn Scialdone Michele 1150 orders received oral potassium, awaiting medication from pharmacy, see orders for details. Roberts-VonCannon, Jader Desai Elon Jester

## 2011-04-26 NOTE — Progress Notes (Signed)
[facility] Individual Statement of Services  Patient Name:  Anna Lyons  Date:  04/26/2011  Welcome to the [facility].  Our goal is to provide you with an individualized program based on your diagnosis and situation, designed to meet your specific needs.  With this comprehensive rehabilitation program, you will be expected to participate in at least 3 hours of rehabilitation therapies Monday-Friday, with modified therapy programming on the weekends.  Your rehabilitation program will include the following services:  Physical Therapy (PT), Occupational Therapy (OT), Speech Therapy (ST), 24 hour per day rehabilitation nursing, Therapeutic Recreaction (TR), Case Management (RN and Child psychotherapist), Rehabilitation Medicine, Nutrition Services and Pharmacy Services  Weekly team conferences will be held on Wednesdays to discuss your progress.  Your RN Case Designer, television/film set will talk with you frequently to get your input and to update you on team discussions.  Team conferences with you and your family in attendance may also be held.  Expected length of stay: 7-10 days Overall anticipated outcome: Modified independent  Depending on your progress and recovery, your program may change.  Your RN Case Estate agent will coordinate services and will keep you informed of any changes.  Your RN Sports coach and SW names and contact numbers are listed  below.  The following services may also be recommended but are not provided by the [facility]:   Driving Evaluations  Home Health Rehabiltiation Services  Outpatient Rehabilitatation [facility]  Vocational Rehabilitation   Arrangements will be made to provide these services after discharge if needed.  Arrangements include referral to agencies that provide these services.  Your insurance has been verified to be:  None (Medicaid pending) Your primary doctor is:    Pertinent  information will be shared with your doctor and your insurance company.  Case Manager: Lutricia Horsfall, [facility] [phone]  Social Worker:  Dossie Der, [place] [phone]  Information discussed with and copy given to patient by: Meryl Dare, 04/26/2011

## 2011-04-26 NOTE — Evaluation (Signed)
Recreational Therapy Assessment and Plan  Patient Details  Name: Anna Lyons MRN: [id number] Date of Birth: 11/05/1955 Today's Date: 04/26/2011  Rehab Potential: Good  ELOS: 10 days   Assessment Clinical Impression: Patient is a 56 y.o right-handed female with history of TIA in July 2012, hypertension, and tobacco abuse admitted 04/20/11 with right-sided weakness and slurred speech. 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 Carotid Dopplers with no internal carotid artery stenosis. Echocardiogram with ejection fraction of 60% without wall motion abnormalities and negative for emboli. Cerebral angiogram with no occlusions or dissection noted. The patient did receive TPA. Patient admitted to [facility] 04/23/11 for continued therapies prior to d/c home. PTA , patient was independent with all mobility and ADLs and provided intermittent care for husband who had CVA 1.5 years ago, and worked full time as Conservation officer, nature at [employer].  Pt presents with decreased activity tolerance, decreased functional mobility, decreased balance, dysarthria, decreased safety/cognition, right sided weakness limiting pt's independence with leisure/community pursuits.  Plan Min 1 time per week >20 minutes  Recommendations for other services: None  Discharge Criteria: Patient will be discharged from TR if patient refuses treatment 3 consecutive times without medical reason.  If treatment goals not met, if there is a change in medical status, if patient makes no progress towards goals or if patient is discharged from hospital.  The above assessment, treatment plan, treatment alternatives and goals were discussed and mutually agreed upon: by patient  Raynah Gomes 04/26/2011, 5:01 PM

## 2011-04-26 NOTE — Progress Notes (Signed)
Speech Language Pathology Daily Session Note  Patient Details  Name: Anna Lyons MRN: [id number] Date of Birth: 1955/04/08  Today's Date: 04/26/2011 Time: 4098-1191 Time Calculation (min): 45 min  Short Term Goals: Week 1: SLP Short Term Goal 1 (Week 1): Pt will utilize speech intelligibility strategies with Mod I during conversational speech and be 100% intelligible. (progressing towards goals) SLP Short Term Goal 2 (Week 1): Pt will demonstrate complex problem solving for functional tasks with Mod I.(met) SLP Short Term Goal 3 (Week 1): Pt will demonstrate emergent awareness into deficits and utilize call bell to call for assistance for wants/needs with Mod I. (progressing)  Skilled Therapeutic Interventions: Treatment focused on high level cognition and speech intelligibility. Speech 90% intelligible at the conversation level however patient reports that during phone conversations, conversational partners are reporting poor intelligibility. Education complete regarding utilization of intelligibility strategies including slow, loud speech, use of over articulation. Patient able to utilize these strategies during conversation with supervision. Patient able to complete high level problem solving tasks regarding medication and money management with modified independence. Supervision required for consistent awareness of physical limitations during functional ADLs. Patient eager to heal and at times, may overestimate her abilities however verbalized need to perform within her bodies limits for safety reasons. Will continue to f/u for skilled treatment with focus on awareness and speech intelligibility.   Daily Session Precautions/Restrictions    FIM:  Expression Expression: 6-Expresses complex ideas: With extra time/assistive device Social Interaction Social Interaction: 6-Interacts appropriately with others with medication or extra time (anti-anxiety, antidepressant). Problem Solving Problem  Solving: 6-Solves complex problems: With extra time Memory Memory: 5-Recognizes or recalls 90% of the time/requires cueing < 10% of the time FIM - Eating Eating Activity: 7: Complete independence:no helper General    Pain Pain Assessment Pain Assessment: No/denies pain Pain Score: 0-No pain Cognition:   Oral/Motor: Oral Motor/Sensory Function Overall Oral Motor/Sensory Function: Impaired Labial ROM: Reduced right Labial Symmetry: Abnormal symmetry right Labial Strength: Reduced Lingual ROM: Reduced right Lingual Symmetry: Abnormal symmetry right Lingual Strength: Reduced Lingual Sensation: Reduced Facial ROM: Reduced right Facial Symmetry: Right droop Facial Strength: Reduced Velum DO NOT USE: Within Functional Limits Mandible: Within Functional Limits Motor Speech Overall Motor Speech: Impaired Respiration: Within functional limits Phonation: Normal Resonance: Within functional limits Articulation: Impaired Level of Impairment: Conversation Intelligibility: Intelligibility reduced Word: 75-100% accurate Phrase: 75-100% accurate Sentence: 75-100% accurate Conversation: 75-100% accurate Motor Planning: Witnin functional limits Motor Speech Errors: Aware Effective Techniques: Over-articulate (slow rate) Comprehension: Auditory Comprehension Yes/No Questions: Within Functional Limits Commands: Within Functional Limits Conversation: Complex Visual Recognition/Discrimination Discrimination: Within Function Limits Reading Comprehension Reading Status: Not tested Expression: Expression Primary Mode of Expression: Verbal Verbal Expression Overall Verbal Expression: Appears within functional limits for tasks assessed Written Expression Dominant Hand: Right Written Expression:  (not tested secondary to weakness)  Therapy/Group: Individual Therapy  Emberly Tomasso Meryl 04/26/2011, 3:17 PM

## 2011-04-27 DIAGNOSIS — I6330 Cerebral infarction due to thrombosis of unspecified cerebral artery: Secondary | ICD-10-CM

## 2011-04-27 DIAGNOSIS — Z5189 Encounter for other specified aftercare: Secondary | ICD-10-CM

## 2011-04-27 LAB — BASIC METABOLIC PANEL
CO2: 29 mEq/L (ref 19–32)
Calcium: 9.7 mg/dL (ref 8.4–10.5)
Chloride: 96 mEq/L (ref 96–112)
Creatinine, Ser: 0.62 mg/dL (ref 0.50–1.10)
GFR calc Af Amer: >90 mL/min (ref >90)
Sodium: 135 mEq/L (ref 135–145)

## 2011-04-27 MED ORDER — GRX ANALGESIC BALM EX OINT
TOPICAL_OINTMENT | Freq: Three times a day (TID) | CUTANEOUS | Status: DC
  Administered 2011-04-27 – 2011-04-30 (×9): via CUTANEOUS
  Filled 2011-04-27: qty 28

## 2011-04-27 MED ORDER — HYDROCHLOROTHIAZIDE 25 MG PO TABS
25.0000 mg | ORAL_TABLET | Freq: Every day | ORAL | Status: DC
  Administered 2011-04-27 – 2011-04-30 (×4): 25 mg via ORAL
  Filled 2011-04-27 (×5): qty 1

## 2011-04-27 MED ORDER — MENTHOL 3 MG MT LOZG
1.0000 | LOZENGE | OROMUCOSAL | Status: DC | PRN
  Administered 2011-04-27: 3 mg via ORAL
  Filled 2011-04-27 (×2): qty 9

## 2011-04-27 NOTE — Progress Notes (Signed)
Occupational Therapy Session Note  Patient Details  Name: Anna Lyons MRN: [id number] Date of Birth: May 11, 1955  Today's Date: 04/27/2011 Time: 5621-3086 Time Calculation (min): 40 min  Short Term Goals: Week 1:  OT Short Term Goal 1 (Week 1): 1.  Pt. will be mod I with bathing. OT Short Term Goal 2 (Week 1): 2.  Pt. will be mod I with dressing OT Short Term Goal 3 (Week 1): 3.  Pt. will be Miod I with toileting/ toilet transfer OT Short Term Goal 4 (Week 1): 4.  pt. will be supervision with tub shower transfer  Skilled Therapeutic Interventions/Progress Updates:    Engaged in therapeutic exercise and NM re-ed with focus on scapular ROM, scapular strengthening, and FMC.  Conducted wall pushups 2 reps of 10 and one arm wall pushups 2 reps of 10 to increase scapular strengthening.  Card activities with focus on [facility] of dealing, flipping, and sorting cards with Rt hand and picking up coins one at a time to increase [facility] and intrinsic hand muscles for dominant Rt hand.  Pt with reports of slight pain in Rt shoulder due to arthritis.  Functional ambulation without RW with close supervision.  Therapy Documentation Precautions:  Precautions Precautions: Fall Required Braces or Orthoses: No Restrictions Weight Bearing Restrictions: No Other Position/Activity Restrictions: dominant right side hemiplegia Pain: Pain Assessment Pain Assessment: No/denies pain Pain Score: 0-No pain  See FIM for current functional status  Therapy/Group: Individual Therapy  Leonette Monarch 04/27/2011, 3:39 PM

## 2011-04-27 NOTE — Progress Notes (Signed)
Patient reports no pain to R shoulder. Muscle rub was ordered this am and applied. Patient was able to elevate R arm at shoulder level without any pain.

## 2011-04-27 NOTE — Progress Notes (Signed)
Patient information reviewed and entered into [facility] system by Mikhail Hallenbeck, RN, CRRN, PPS Coordinator.  Information including medical coding and functional independence measure will be reviewed and updated through discharge.    

## 2011-04-27 NOTE — Progress Notes (Signed)
Speech Language Pathology Daily Session Note  Patient Details  Name: Anna Lyons MRN: [id number] Date of Birth: Sep 06, 1955  Today's Date: 04/27/2011 Time: 0900-0930 Time Calculation (min): 30 min  Short Term Goals: Week 1: SLP Short Term Goal 1 (Week 1): Pt will utilize speech intelligibility strategies with Mod I during conversational speech and be 100% intelligible.  SLP Short Term Goal 1 - Progress (Week 1): Progressing toward goal SLP Short Term Goal 2 (Week 1): Pt will demonstrate complex problem solving for functional tasks with Mod I. SLP Short Term Goal 2 - Progress (Week 1): Met SLP Short Term Goal 3 (Week 1): Pt will demonstrate emergent awareness into deficits and utilize call bell to call for assistance for wants/needs with Mod I.  SLP Short Term Goal 3 - Progress (Week 1): Progressing toward goal  Skilled Therapeutic Interventions:  Treatment focused on speech intelligibility, problem solving and awareness.  Initially reviewed strategies for intelligible speech which she required mild cues to state.  She reported friend stated increased ability to understand her on the phone last night but pt. unable to state what strategies she utilized during that conversation.  SLP reviewed adequate posture to facilitate breath support for conversational speech.  During card game pt. exhibited mod I (solitaire) to min verbal cues with new game taught by SLP.     Daily Session Precautions/Restrictions  Restrictions Weight Bearing Restrictions: No FIM:  Comprehension Comprehension: 6-Follows complex conversation/direction: With extra time/assistive device Expression Expression: 6-Expresses complex ideas: With extra time/assistive device Social Interaction Social Interaction: 6-Interacts appropriately with others with medication or extra time (anti-anxiety, antidepressant). Problem Solving Problem Solving: 6-Solves complex problems: With extra time Memory Memory: 6-Assistive device: No  helper FIM - Eating Eating Activity: 7: Complete independence:no helper General    Pain Pain Assessment Pain Assessment: No/denies pain Pain Score: 0-No pain Cognition: Orientation Level: Oriented X4 Oral/Motor: Oral Motor/Sensory Function Overall Oral Motor/Sensory Function: Impaired Labial ROM: Reduced right Labial Symmetry: Abnormal symmetry right Labial Strength: Reduced Lingual ROM: Reduced right Lingual Symmetry: Abnormal symmetry right Lingual Strength: Reduced Lingual Sensation: Reduced Facial ROM: Reduced right Facial Symmetry: Right droop Facial Strength: Reduced Velum DO NOT USE: Within Functional Limits Mandible: Within Functional Limits Motor Speech Overall Motor Speech: Impaired Respiration: Within functional limits Phonation: Normal Resonance: Within functional limits Articulation: Impaired Level of Impairment: Conversation Intelligibility: Intelligibility reduced Word: 75-100% accurate Phrase: 75-100% accurate Sentence: 75-100% accurate Conversation: 75-100% accurate Motor Planning: Witnin functional limits Motor Speech Errors: Aware Effective Techniques: Over-articulate (slow rate) Comprehension: Auditory Comprehension Yes/No Questions: Within Functional Limits Commands: Within Functional Limits Conversation: Complex Visual Recognition/Discrimination Discrimination: Within Function Limits Reading Comprehension Reading Status: Not tested Expression: Expression Primary Mode of Expression: Verbal Verbal Expression Overall Verbal Expression: Appears within functional limits for tasks assessed Written Expression Dominant Hand: Right Written Expression:  (not tested secondary to weakness)  Therapy/Group: Individual Therapy  Royce Macadamia 04/27/2011, 10:07 AM

## 2011-04-27 NOTE — Progress Notes (Signed)
Patient complaining of slightly sore throat this afternoon, PA notified, new orders written. Will assess for effectiveness of treatment. Otherwise no other complaints, tolerating therapies well. No complaints of pain to R shoulder, muscle rub applied as ordered with effectiveness reported by patient. Husband at the bed side. No unsafe behaviors noted and call appropriately.

## 2011-04-27 NOTE — Progress Notes (Signed)
Patient ID: Anna Lyons, female   DOB: August 17, 1955, 56 y.o.   MRN: [id number] Patient ID: Anna Lyons, female   DOB: 1956-01-22, 56 y.o.   MRN: [id number]  Subjective/Complaints:   Objective: Vital Signs: Blood pressure 121/82, pulse 64, temperature 97.8 F (36.6 C), temperature source Oral, resp. rate 18, weight 64.955 kg (143 lb 3.2 oz), SpO2 98.00%. Dg Shoulder Right  04/26/2011  *RADIOLOGY REPORT*  Clinical Data: Right anterior shoulder pain, right CVA  RIGHT SHOULDER - 2+ VIEW  Comparison: The chest radiograph 08/1958 1012  Findings: There is no fracture or dislocation of the right shoulder.  There is mild degenerative change of the acromioclavicular joint.  IMPRESSION: 1. No acute findings  of the right shoulder.  2.  Mild degenerative change at the acromioclavicular joint.  Original Report Authenticated By: Genevive Bi, M.D.   Results for orders placed during the hospital encounter of 04/23/11 (from the past 72 hour(s))  CBC     Status: Normal   Collection Time   04/26/11  9:49 AM      Component Value Range Comment   WBC 5.1  4.0 - 10.5 (K/uL)    RBC 4.12  3.87 - 5.11 (MIL/uL)    Hemoglobin 14.0  12.0 - 15.0 (g/dL)    HCT 78.2  95.6 - 21.3 (%)    MCV 98.3  78.0 - 100.0 (fL)    MCH 34.0  26.0 - 34.0 (pg)    MCHC 34.6  30.0 - 36.0 (g/dL)    RDW 08.6  57.8 - 46.9 (%)    Platelets 203  150 - 400 (K/uL)   COMPREHENSIVE METABOLIC PANEL     Status: Abnormal   Collection Time   04/26/11  9:49 AM      Component Value Range Comment   Sodium 134 (*) 135 - 145 (mEq/L)    Potassium 2.8 (*) 3.5 - 5.1 (mEq/L)    Chloride 93 (*) 96 - 112 (mEq/L)    CO2 28  19 - 32 (mEq/L)    Glucose, Bld 118 (*) 70 - 99 (mg/dL)    BUN 18  6 - 23 (mg/dL)    Creatinine, Ser 6.29  0.50 - 1.10 (mg/dL)    Calcium 52.8  8.4 - 10.5 (mg/dL)    Total Protein 7.6  6.0 - 8.3 (g/dL)    Albumin 3.7  3.5 - 5.2 (g/dL)    AST 26  0 - 37 (U/L)    ALT 19  0 - 35 (U/L)    Alkaline Phosphatase 73  39 - 117 (U/L)    Total  Bilirubin 0.5  0.3 - 1.2 (mg/dL)    GFR calc non Af Amer >90  >90 (mL/min)    GFR calc Af Amer >90  >90 (mL/min)   DIFFERENTIAL     Status: Normal   Collection Time   04/26/11  9:49 AM      Component Value Range Comment   Neutrophils Relative 67  43 - 77 (%)    Neutro Abs 3.4  1.7 - 7.7 (K/uL)    Lymphocytes Relative 21  12 - 46 (%)    Lymphs Abs 1.0  0.7 - 4.0 (K/uL)    Monocytes Relative 11  3 - 12 (%)    Monocytes Absolute 0.6  0.1 - 1.0 (K/uL)    Eosinophils Relative 1  0 - 5 (%)    Eosinophils Absolute 0.1  0.0 - 0.7 (K/uL)    Basophils Relative 0  0 -  1 (%)    Basophils Absolute 0.0  0.0 - 0.1 (K/uL)      Gen: NAD HEENT: + HOH, +Dysarthria Lungs:  Clear Cor:  RRR no M Abd:+BS soft NT Ext - C/C/E Motor:  3/5 R delt bi, tri,grip 3/5 R HF, KE, Ankle DF Sensory intact MSK Pain over bicipital groove Pain to palp over R foot/ankle , -pain with ROM, no erythema   Assessment/Plan: 1. Functional deficits secondary to L basal ganglia infarct which require 3+ hours per day of interdisciplinary therapy in a comprehensive inpatient rehab setting. Physiatrist is providing close team supervision and 24 hour management of active medical problems listed below. Physiatrist and rehab team continue to assess barriers to discharge/monitor patient progress toward functional and medical goals. FIM: FIM - Bathing Bathing Steps Patient Completed: Chest;Right Arm;Left Arm;Abdomen;Front perineal area;Right upper leg;Left upper leg;Right lower leg (including foot);Left lower leg (including foot) Bathing: 5: Supervision: Safety issues/verbal cues  FIM - Upper Body Dressing/Undressing Upper body dressing/undressing steps patient completed: Thread/unthread right sleeve of pullover shirt/dresss;Thread/unthread left sleeve of pullover shirt/dress;Put head through opening of pull over shirt/dress;Pull shirt over trunk Upper body dressing/undressing: 5: Supervision: Safety issues/verbal cues FIM -  Lower Body Dressing/Undressing Lower body dressing/undressing steps patient completed: Thread/unthread right underwear leg;Thread/unthread left underwear leg;Pull underwear up/down;Thread/unthread right pants leg;Thread/unthread left pants leg;Pull pants up/down;Don/Doff right sock;Don/Doff left sock Lower body dressing/undressing: 5: Supervision: Safety issues/verbal cues  FIM - Toileting Toileting steps completed by patient: Adjust clothing prior to toileting;Performs perineal hygiene;Adjust clothing after toileting Toileting Assistive Devices: Grab bar or rail for support Toileting: 5: Supervision: Safety issues/verbal cues  FIM - Diplomatic Services operational officer Devices: Art gallery manager Transfers: 5-To toilet/BSC: Supervision (verbal cues/safety issues);5-From toilet/BSC: Supervision (verbal cues/safety issues)  FIM - Banker Devices: Therapist, occupational: 6: Supine > Sit: No assist;6: Sit > Supine: No assist;5: Bed > Chair or W/C: Supervision (verbal cues/safety issues);5: Chair or W/C > Bed: Supervision (verbal cues/safety issues)  FIM - Locomotion: Wheelchair Distance: 35 Locomotion: Wheelchair: 0: Activity did not occur FIM - Locomotion: Ambulation Locomotion: Ambulation Assistive Devices: Designer, industrial/product Ambulation/Gait Assistance: 5: Supervision;4: Min assist Locomotion: Ambulation: 5: Travels 150 ft or more with supervision/safety issues  Comprehension Comprehension Mode: Auditory Comprehension: 6-Follows complex conversation/direction: With extra time/assistive device  Expression Expression Mode: Verbal Expression: 6-Expresses complex ideas: With extra time/assistive device  Social Interaction Social Interaction: 6-Interacts appropriately with others with medication or extra time (anti-anxiety, antidepressant).  Problem Solving Problem Solving: 6-Solves complex problems: With extra time  Memory Memory: 6-More  than reasonable amt of time  Medical Problem List and Plan:  1. 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 Work on ROM for now with therapies and during down time. She has reasonable underlying strength.  2. DVT Prophylaxis/Anticoagulation: SCDs. Monitor for any signs of deep vein thrombosis  3. Hypertension. Hydrochlorothiazide change 50 mg daily to 25 mg. Monitor with increased mobility. Watch weights, i's and o's with diuretic on board.  4. Depression. Zoloft. Provide emotional support and positive reinforcement. Seems to be up beat and has a supportive family network.  5. Tobacco abuse. Consult to tobacco cessation counselor obtained while on acute care services  6.  R bicipital tendinitis vs shoulder OA 7.  R foot pain - exam likely metatarsalgia  LOS (Days) 4 A FACE TO FACE EVALUATION WAS PERFORMED  Madyx Delfin E 04/27/2011, 7:09 AM     Review of Systems  Musculoskeletal: Positive  for joint pain.       R shoulder and R anterior foot

## 2011-04-27 NOTE — Progress Notes (Signed)
Occupational Therapy Session Note  Patient Details  Name: Anna Lyons MRN: [id number] Date of Birth: July 16, 1955  Today's Date: 04/27/2011 Visit 1: 830-845, 15 minutes Visit 2:Time: 1000-1100 Time Calculation (min): 60 min  Short Term Goals: Week 1:  OT Short Term Goal 1 (Week 1): 1.  Pt. will be mod I with bathing. OT Short Term Goal 2 (Week 1): 2.  Pt. will be mod I with dressing OT Short Term Goal 3 (Week 1): 3.  Pt. will be Miod I with toileting/ toilet transfer OT Short Term Goal 4 (Week 1): 4.  pt. will be supervision with tub shower transfer  Skilled Therapeutic Interventions/Progress Updates:  Visit 1: ADL retraining of toileting, bathing and dressing with a focus on standing balance.  Pt donned underwear and pants from standing using one hand on walker for support.  Able to fasten buttons, ties shoes without assist. Visit 2: Pt seen for neuromuscular reed for RUE with various fine motor tasks (manipulating coins in hand, dealing cards, small peg placement, theraputty), scapular mobility and strengthening with quadriped weight bearing, isolated shoulder movements, pronation/supination and wrist strengthening with 2# weight.  Pt also worked on supported shoulder flexion using graded movement and assisted movement with hula hoop.  Pt only has minimal pain with flexing shoulder above 90 degrees.  Encouraged pt to restrict arom to this range until she has increased scapular control.     Therapy Documentation Precautions:  Precautions Precautions: Fall Required Braces or Orthoses: No Restrictions Weight Bearing Restrictions: No Other Position/Activity Restrictions: dominant right side hemiplegia    Pain: Pain Assessment Pain Assessment: No/denies pain Pain Score: 0-No pain     See FIM for current functional status  Therapy/Group: Individual Therapy  Son Barkan 04/27/2011, 11:17 AM

## 2011-04-27 NOTE — Progress Notes (Signed)
Patient reports throat is feeling better after receiving Cepacol.

## 2011-04-27 NOTE — Progress Notes (Signed)
Physical Therapy Session Note  Patient Details  Name: Anna Lyons MRN: [id number] Date of Birth: [birth year]  Today's Date: 04/27/2011 Time: 7829-5621 Time Calculation (min): 46 min  Short Term Goals: Week 1:  PT Short Term Goal 1 (Week 1): STGs = LTGs due to short length of stay. Modified independent overall with supervision for stair negotiation. PT Short Term Goal 1 - Progress (Week 1): Progressing toward goal  Therapy Documentation Precautions:  Precautions Precautions: Fall Required Braces or Orthoses: No Restrictions Weight Bearing Restrictions: No Other Position/Activity Restrictions: dominant right side hemiplegia Pain: Pain Assessment Pain Assessment: No/denies pain Pain Score: 0-No pain Locomotion : Ambulation Ambulation: Yes Ambulation/Gait Assistance: 5: Supervision Ambulation Distance (Feet): 150 Feet Assistive device: Rolling walker;None Ambulation/Gait Assistance Details: Verbal cues for precautions/safety;Verbal cues for gait pattern;Other (comment) (bilat walking sticks) Ambulation/Gait Assistance Details (indicate cue type and reason): Patient supervision with RW in room and controlled environment but her goal is to be able to ambulate in her home without use of RW; removed RW and performed gait training on unit without AD with supervision with decreased trunk rotation, arm swing, decreased hip and knee flexion in swing and increased R foot drag when fatigued.  Gait training with bilat walking sticks x 150 with min A with two point gait pattern for alternating UE and LE rhythmic movement and for trunk/pelvic rotation.    Exercises:  Patient given handout of standing balance and LE strengthening HEP; educated on how to perform each exercise with visual demonstration and how to progress from bilat UE support >> one UE support to increased balance challenge; patient gave repeat demonstration of each exercise with supervision-min A for correct muscle activation Other  Treatments: Treatments Therapeutic Activity: On simulated car patient demonstrated safe passenger side transfer with supervision stepping foot in first and then sitting in seat.    See FIM for current functional status  Therapy/Group: Individual Therapy  Edman Circle [facility] 04/27/2011, 4:09 PM

## 2011-04-28 MED ORDER — POTASSIUM CHLORIDE CRYS ER 20 MEQ PO TBCR
30.0000 meq | EXTENDED_RELEASE_TABLET | Freq: Two times a day (BID) | ORAL | Status: DC
  Administered 2011-04-28 – 2011-04-30 (×5): 30 meq via ORAL
  Filled 2011-04-28 (×8): qty 1

## 2011-04-28 NOTE — Progress Notes (Signed)
Social Work Patient ID: Anna Lyons, female   DOB: [birth year], 56 y.o.   MRN: [id number] Met with pt and husband to inform of team conference-goals-independent level and discharge 3/29. Pt and husband are very pleased with her progress.  She has only been in the hospital for nine days. Her main goal is to go back to work.  She really likes her job and wants to be able to return.  Discussed OP Rehab recommended, husband has been there before.  Will make referral for OPPT and OPOT. No DME needs.  Continue to work toward discharge for Friday

## 2011-04-28 NOTE — Progress Notes (Signed)
Physical Therapy Session Note  Patient Details  Name: Anna Lyons MRN: [id number] Date of Birth: 03-May-1955  Today's Date: 04/28/2011 Time: [phone] and 1400-1500  Time Calculation (min): 48 min and 60 min  Short Term Goals: Week 1:  PT Short Term Goal 1 (Week 1): STGs = LTGs due to short length of stay. Modified independent overall with supervision for stair negotiation. PT Short Term Goal 1 - Progress (Week 1): Progressing toward goal  Therapy Documentation Precautions:  Precautions Precautions: Fall Required Braces or Orthoses: No Restrictions Weight Bearing Restrictions: No Other Position/Activity Restrictions: dominant right side hemiplegia Pain: Pain Assessment Pain Assessment: No/denies pain Pain Score: 0-No pain Locomotion : Ambulation Ambulation/Gait Assistance: 5: Supervision on unit with SPC with verbal and visual cues for sequencing for increased stability and endurance longer distances  PM session: Gait training on treadmill at 1.0 mph for 417 feet with mod A for facilitation of RLE full hip extension in terminal stance, hip and knee flexion with ankle DF during swing with heel strike at initial contact; performed R lateral stepping with hip IR and in squat position for increased activation of glute med and increased knee control during gait; patient still catching R toe on ground; added bootie to toe box of R shoe to decrease friction of tread and performed gait training on unit >150' with supervision with improved bilat step and stride length, foot clearance and heel strike on RLE and increased stance time on RLE.  Exercises:  In sitting performed passive anterior pec stretches and demonstrated to patient how to perform supine pec stretches for improved shoulder joint positioning prior to UE forward flexion and ABD on RUE; demonstrated to patient how to perform gravity minimized RUE horizontal ABD<>ADD and UE protraction and retraction with support on tall table with  shoulder at 90 deg forward flexion to minimize upper trap activation and improve timing and sequencing of shoulder muscles for AROM without pain Other Treatments: Treatments Therapeutic Activity: Discussed with patient how to perform safe floor <> furniture transfer if she were to have a fall in the home and discussed signs and symptoms of fracture or stroke and when to call EMS; patient gave repeat demonstration of floor transfer with close supervision and verbal cues for sequence.  Had patient demonstrate transfer bed <> toilet without use of AD; performed safely with minimal UE support on wall and furniture.  Patient cleared to transfer in room and to toilet mod I. Neuromuscular Facilitation: Right;Lower Extremity;Forced use;Activity to increase coordination;Activity to increase motor control;Activity to increase sustained activation;Activity to increase lateral weight shifting while sitting on large Swiss ball for trunk and pelvic motor control and activation for stabilization during dynamic UE movements, trunk rotation and LE ankle DF, hip flexion, knee extension x 10 reps each exercise, each LE with min-mod A for stability and to maintain trunk position.  PM session: functional mobility in community setting, elevator and gift shop with focus on safe ambulation in crowded and distracting environment, side stepping and retro stepping around obstacles, reaching for objects on higher shelves with RUE and carrying objects in RUE, squatting to reach objects on low shelves; all with supervision and no verbal cues for safety  See FIM for current functional status  Therapy/Group: Individual Therapy  Edman Circle [facility] 04/28/2011, 10:50 AM

## 2011-04-28 NOTE — Progress Notes (Signed)
Recreational Therapy Session Note  Patient Details  Name: Anna Lyons MRN: [id number] Date of Birth: 11/11/1955 Today's Date: 04/28/2011 Time:  1400-1430 Pain: c/o right shoulder pain with movement, relieved with rest  Skilled Therapeutic Interventions/Progress Updates: Ambulated to gift shop and negotiated obstacles with supervision.  Pt retrieved items from various heights of shelving using RUE with supervision.  Discussed community reintegration/outing scheduled for tomorrow.  Pt agreeable.  Therapy/Group: Co-Treatment  Activity Level: Moderate:  Level of assist: Supervision  Kharter Brew 04/28/2011, 4:37 PM

## 2011-04-28 NOTE — Progress Notes (Signed)
Occupational Therapy Session Note  Patient Details  Name: Anna Lyons MRN: [id number] Date of Birth: [birth year]  Today's Date: 04/28/2011 Time: 0805-0900 Time Calculation (min): 55 min  Short Term Goals: Week 1:  OT Short Term Goal 1 (Week 1): 1.  Pt. will be mod I with bathing. OT Short Term Goal 2 (Week 1): 2.  Pt. will be mod I with dressing OT Short Term Goal 3 (Week 1): 3.  Pt. will be Miod I with toileting/ toilet transfer OT Short Term Goal 4 (Week 1): 4.  pt. will be supervision with tub shower transfer  Skilled Therapeutic Interventions/Progress Updates: Pt seen for ADL retraining of toileting, bathing at shower level, dressing with a focus on using RUE as a dominant assist and standing balance.  Pt used right hand for all activities and was able to complete all activities at a mod I level, except for supervision with shower transfer.  Pt given HEP for forearm/wrist strengthening with yellow theraband.  Completed 2 sets of wrist exercises and 1 set of squats.     Therapy Documentation Precautions:  Precautions Precautions: Fall Required Braces or Orthoses: No Restrictions Weight Bearing Restrictions: No Other Position/Activity Restrictions: dominant right side hemiplegia   Pain: Pain Assessment Pain Assessment: No/denies pain ADL: ADL Eating: Independent Where Assessed-Eating: Chair Grooming: Independent Where Assessed-Grooming: Sitting at sink Upper Body Bathing: Independent Where Assessed-Upper Body Bathing: Shower Lower Body Bathing: Modified independent Where Assessed-Lower Body Bathing: Shower Upper Body Dressing: Independent Where Assessed-Upper Body Dressing: Standing at sink Lower Body Dressing: Modified independent Where Assessed-Lower Body Dressing: Standing at sink;Edge of bed Toileting: Modified independent Where Assessed-Toileting: Teacher, adult education: Engineer, agricultural Method: Proofreader: Building services engineer Transfer: Distant supervision Film/video editor Method: Designer, industrial/product: Shower seat with back    See FIM for current functional status  Therapy/Group: Individual Therapy  [name] 04/28/2011, 9:19 AM

## 2011-04-28 NOTE — Progress Notes (Signed)
Patient ID: Anna Lyons, female   DOB: [birth year], 56 y.o.   MRN: [id number]  Subjective/Complaints:  R shoulder pain improved.  R foot only bothers her with amb but overall better Objective: Vital Signs: Blood pressure 118/73, pulse 60, temperature 98.1 F (36.7 C), temperature source Oral, resp. rate 17, weight 64.955 kg (143 lb 3.2 oz), SpO2 95.00%. Dg Shoulder Right  04/26/2011  *RADIOLOGY REPORT*  Clinical Data: Right anterior shoulder pain, right CVA  RIGHT SHOULDER - 2+ VIEW  Comparison: The chest radiograph 08/1958 1012  Findings: There is no fracture or dislocation of the right shoulder.  There is mild degenerative change of the acromioclavicular joint.  IMPRESSION: 1. No acute findings  of the right shoulder.  2.  Mild degenerative change at the acromioclavicular joint.  Original Report Authenticated By: Genevive Bi, M.D.   Results for orders placed during the hospital encounter of 04/23/11 (from the past 72 hour(s))  CBC     Status: Normal   Collection Time   04/26/11  9:49 AM      Component Value Range Comment   WBC 5.1  4.0 - 10.5 (K/uL)    RBC 4.12  3.87 - 5.11 (MIL/uL)    Hemoglobin 14.0  12.0 - 15.0 (g/dL)    HCT 78.2  95.6 - 21.3 (%)    MCV 98.3  78.0 - 100.0 (fL)    MCH 34.0  26.0 - 34.0 (pg)    MCHC 34.6  30.0 - 36.0 (g/dL)    RDW 08.6  57.8 - 46.9 (%)    Platelets 203  150 - 400 (K/uL)   COMPREHENSIVE METABOLIC PANEL     Status: Abnormal   Collection Time   04/26/11  9:49 AM      Component Value Range Comment   Sodium 134 (*) 135 - 145 (mEq/L)    Potassium 2.8 (*) 3.5 - 5.1 (mEq/L)    Chloride 93 (*) 96 - 112 (mEq/L)    CO2 28  19 - 32 (mEq/L)    Glucose, Bld 118 (*) 70 - 99 (mg/dL)    BUN 18  6 - 23 (mg/dL)    Creatinine, Ser 6.29  0.50 - 1.10 (mg/dL)    Calcium 52.8  8.4 - 10.5 (mg/dL)    Total Protein 7.6  6.0 - 8.3 (g/dL)    Albumin 3.7  3.5 - 5.2 (g/dL)    AST 26  0 - 37 (U/L)    ALT 19  0 - 35 (U/L)    Alkaline Phosphatase 73  39 - 117 (U/L)    Total Bilirubin 0.5  0.3 - 1.2 (mg/dL)    GFR calc non Af Amer >90  >90 (mL/min)    GFR calc Af Amer >90  >90 (mL/min)   DIFFERENTIAL     Status: Normal   Collection Time   04/26/11  9:49 AM      Component Value Range Comment   Neutrophils Relative 67  43 - 77 (%)    Neutro Abs 3.4  1.7 - 7.7 (K/uL)    Lymphocytes Relative 21  12 - 46 (%)    Lymphs Abs 1.0  0.7 - 4.0 (K/uL)    Monocytes Relative 11  3 - 12 (%)    Monocytes Absolute 0.6  0.1 - 1.0 (K/uL)    Eosinophils Relative 1  0 - 5 (%)    Eosinophils Absolute 0.1  0.0 - 0.7 (K/uL)    Basophils Relative 0  0 - 1 (%)  Basophils Absolute 0.0  0.0 - 0.1 (K/uL)   BASIC METABOLIC PANEL     Status: Abnormal   Collection Time   04/27/11  5:50 AM      Component Value Range Comment   Sodium 135  135 - 145 (mEq/L)    Potassium 3.2 (*) 3.5 - 5.1 (mEq/L)    Chloride 96  96 - 112 (mEq/L)    CO2 29  19 - 32 (mEq/L)    Glucose, Bld 90  70 - 99 (mg/dL)    BUN 18  6 - 23 (mg/dL)    Creatinine, Ser 0.98  0.50 - 1.10 (mg/dL)    Calcium 9.7  8.4 - 10.5 (mg/dL)    GFR calc non Af Amer >90  >90 (mL/min)    GFR calc Af Amer >90  >90 (mL/min)      Gen: NAD HEENT: + HOH, +Dysarthria Lungs:  Clear Cor:  RRR no M Abd:+BS soft NT Ext - C/C/E Motor:  3/5 R delt bi, tri,grip 3/5 R HF, KE, Ankle DF Sensory intact MSK Pain over bicipital groove Pain to palp over R foot/ankle , -pain with ROM, no erythema   Assessment/Plan: 1. Functional deficits secondary to L basal ganglia infarct which require 3+ hours per day of interdisciplinary therapy in a comprehensive inpatient rehab setting. Physiatrist is providing close team supervision and 24 hour management of active medical problems listed below. Physiatrist and rehab team continue to assess barriers to discharge/monitor patient progress toward functional and medical goals. FIM: FIM - Bathing Bathing Steps Patient Completed: Chest;Right Arm;Left Arm;Abdomen;Front perineal area;Right upper  leg;Left upper leg;Right lower leg (including foot);Left lower leg (including foot) Bathing: 5: Supervision: Safety issues/verbal cues  FIM - Upper Body Dressing/Undressing Upper body dressing/undressing steps patient completed: Thread/unthread right sleeve of pullover shirt/dresss;Thread/unthread left sleeve of pullover shirt/dress;Put head through opening of pull over shirt/dress;Pull shirt over trunk Upper body dressing/undressing: 5: Supervision: Safety issues/verbal cues FIM - Lower Body Dressing/Undressing Lower body dressing/undressing steps patient completed: Thread/unthread right underwear leg;Thread/unthread left underwear leg;Pull underwear up/down;Thread/unthread right pants leg;Thread/unthread left pants leg;Pull pants up/down;Don/Doff right sock;Don/Doff left sock Lower body dressing/undressing: 5: Supervision: Safety issues/verbal cues  FIM - Toileting Toileting steps completed by patient: Adjust clothing prior to toileting;Performs perineal hygiene;Adjust clothing after toileting Toileting Assistive Devices: Grab bar or rail for support Toileting: 5: Supervision: Safety issues/verbal cues  FIM - Diplomatic Services operational officer Devices: Art gallery manager Transfers: 5-To toilet/BSC: Supervision (verbal cues/safety issues);5-From toilet/BSC: Supervision (verbal cues/safety issues)  FIM - Banker Devices: Therapist, occupational: 6: Supine > Sit: No assist;6: Sit > Supine: No assist;5: Bed > Chair or W/C: Supervision (verbal cues/safety issues);5: Chair or W/C > Bed: Supervision (verbal cues/safety issues)  FIM - Locomotion: Wheelchair Distance: 35 Locomotion: Wheelchair: 0: Activity did not occur FIM - Locomotion: Ambulation Locomotion: Ambulation Assistive Devices: Designer, industrial/product Ambulation/Gait Assistance: 5: Supervision Locomotion: Ambulation: 5: Travels 150 ft or more with supervision/safety  issues  Comprehension Comprehension Mode: Auditory Comprehension: 6-Follows complex conversation/direction: With extra time/assistive device  Expression Expression Mode: Verbal Expression: 6-Expresses complex ideas: With extra time/assistive device  Social Interaction Social Interaction: 6-Interacts appropriately with others with medication or extra time (anti-anxiety, antidepressant).  Problem Solving Problem Solving: 6-Solves complex problems: With extra time  Memory Memory: 6-More than reasonable amt of time  Medical Problem List and Plan:  1. 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 Work on ROM for now with therapies  and during down time. She has reasonable underlying strength.  2. DVT Prophylaxis/Anticoagulation: SCDs. Monitor for any signs of deep vein thrombosis  3. Hypertension. Hydrochlorothiazide change 50 mg daily to 25 mg. Monitor with increased mobility. Watch weights, i's and o's with diuretic on board.  4. Depression. Zoloft. Provide emotional support and positive reinforcement. Seems to be up beat and has a supportive family network.  5. Tobacco abuse. Consult to tobacco cessation counselor obtained while on acute care services  6.  R bicipital tendinitis vs shoulder OA Xray show mild AC joint OA improved with sports cream and ice 7.  R foot pain mild - exam likely metatarsalgia  LOS (Days) 5 A FACE TO FACE EVALUATION WAS PERFORMED  Kammie Scioli E 04/28/2011, 7:23 AM     Review of Systems  Musculoskeletal: Positive for joint pain.       R shoulder and R anterior foot

## 2011-04-28 NOTE — Patient Care Conference (Signed)
Inpatient RehabilitationTeam Conference Note Date: 04/28/2011   Time: 10:55 AM    Patient Name: Anna Lyons      Medical Record Number: [id number]  Date of Birth: 1955/05/13 Sex: Female         Room/Bed: 4029/4029-01 Payor Info: Payor:     Admitting Diagnosis: LT CVA, S/P PA  Admit Date/Time:  04/23/2011  3:00 PM Admission Comments: No comment available   Primary Diagnosis:  CVA (cerebral infarction) Principal Problem: CVA (cerebral infarction)  Patient Active Problem List  Diagnoses Date Noted  . CVA (cerebral infarction) 04/23/2011  . Depression     Expected Discharge Date: Expected Discharge Date: 04/30/11  Team Members Present: Physician: Dr. Claudette Laws Case Manager Present: Lutricia Horsfall, RN Social Worker Present: Dossie Der, LCSW Nurse Present: Gregor Hams, RN PT Present: Edman Circle, Lillie Columbia, PT OT Present: Bretta Bang, Verlene Mayer, OT SLP Present: Fae Pippin, SLP PPS Coord: Tora Duck, RN    Current Status/Progress Goal Weekly Team Focus  Medical   Right shoulder pain, right foot pain  Improve pain complaints  Medication adjustments possible need for further diagnostic studies   Bowel/Bladder   continent of Bowel and Bladder  Continent  Up to BR for all toileting   Swallow/Nutrition/ Hydration   Heart Healthy Diet         ADL's   mod I with toileting, dressing, bathing; supervision with shower stall transfer.  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, sports I  ADL retraining, balance activities, Scientist, product/process development   supervision overall  mod I overall except supervision stairs and car transfer  gait and balance, D/C planning   Communication   Mod I   Mod I   Education   Safety/Cognition/ Behavioral Observations  Mod I with complex  supervision-Mod I   Patient carryover; education   Pain   no c/o of pain  5 or less on scale of 1-10      Skin   intact  no breakdown         *See Interdisciplinary Assessment and Plan and progress notes for long and short-term goals  Barriers to Discharge: Needs to be at high functional status due to husbands history of stroke    Possible Resolutions to Barriers:  Involved daughter as caregiver, continue rehabilitation    Discharge Planning/Teaching Needs:  Home with husband who can provide supervision level due to ambulates with a cane.  Daughter to check frequently.      Team Discussion: Discussion of pt's dx, hx, goals, d/c plan. Pt's husband was a recent pt on CIR with CVA. Pt's shoulder pain improved using ice and topical analgesic cream.   Pt dysarthric. SLP working on higher level awareness. Pt making quick gains and made independent to go to bathroom today.   Revisions to Treatment Plan:  none   Continued Need for Acute Rehabilitation Level of Care: The patient requires daily medical management by a physician with specialized training in physical medicine and rehabilitation for the following conditions: Daily direction of a multidisciplinary physical rehabilitation program to ensure safe treatment while eliciting the highest outcome that is of practical value to the patient.: Yes Daily medical management of patient stability for increased activity during participation in an intensive rehabilitation regime.: Yes Daily analysis of laboratory values and/or radiology reports with any subsequent need for medication adjustment of medical intervention for : Neurological problems  Claudean Severance Ninfa Linden  04/28/2011, 2:06 PM

## 2011-04-28 NOTE — Progress Notes (Signed)
Called pt's daughter to report on team conference. She was in agreement with plan for discharge on 04/30/11 and expressed that she was very pleased with her mother's progress. SW met with pt and husband to discuss team conference and d/c plans.

## 2011-04-28 NOTE — Progress Notes (Signed)
Speech Language Pathology Daily Session Note  Patient Details  Name: Anna Lyons MRN: [id number] Date of Birth: [birth year]  Today's Date: 04/28/2011 Time: 1000-1030 Time Calculation (min): 30 min  Short Term Goals: Week 1: SLP Short Term Goal 1 (Week 1): Pt will utilize speech intelligibility strategies with Mod I during conversational speech and be 100% intelligible.  SLP Short Term Goal 1 - Progress (Week 1): Progressing toward goal SLP Short Term Goal 2 (Week 1): Pt will demonstrate complex problem solving for functional tasks with Mod I. SLP Short Term Goal 2 - Progress (Week 1): Met SLP Short Term Goal 3 (Week 1): Pt will demonstrate emergent awareness into deficits and utilize call bell to call for assistance for wants/needs with Mod I.  SLP Short Term Goal 3 - Progress (Week 1): Progressing toward goal  Skilled Therapeutic Interventions: Session focused on complex problem solving with money and medication management tasks (per patient request) which utilized use of right upper extremity as well as required patient to divide attention between two forms of written information to complete given task; patient was Mod I with increased wait/procesing time to complete various tasks.  SLP educated patient on resources for her after discharge such as easy open medication lids.  Overall, patient was intelligible throughout session with self monitoring and correcting errors with increased wait time and patient was able to verbally identify that slowing her rate was most effective at facilitating her intelligibility.   Daily Session Precautions/Restrictions    FIM:  Comprehension Comprehension Mode: Auditory Comprehension: 6-Follows complex conversation/direction: With extra time/assistive device Expression Expression Mode: Verbal Expression: 6-Expresses complex ideas: With extra time/assistive device Social Interaction Social Interaction: 6-Interacts appropriately with others with  medication or extra time (anti-anxiety, antidepressant). Problem Solving Problem Solving: 6-Solves complex problems: With extra time Memory Memory: 6-More than reasonable amt of time FIM - Eating Eating Activity: 7: Complete independence:no helper General    Pain Pain Assessment Pain Assessment: No/denies pain Pain Score: 0-No pain  Therapy/Group: Individual Therapy  Charlane Ferretti., CCC-SLP [phone]  Ethie Curless 04/28/2011, 12:14 PM

## 2011-04-29 DIAGNOSIS — Z5189 Encounter for other specified aftercare: Secondary | ICD-10-CM

## 2011-04-29 DIAGNOSIS — I6330 Cerebral infarction due to thrombosis of unspecified cerebral artery: Secondary | ICD-10-CM

## 2011-04-29 NOTE — Progress Notes (Signed)
Occupational Therapy Discharge Summary and Therapy Intervention  Patient Details  Name: Anna Lyons MRN: [id number] Date of Birth: 01/04/1956  Today's Date: 04/29/2011 Time: 0803-0900 Time Calculation (min): 57 min  Skilled Therapy Intervention:  Pt seen for ADL retraining of B/D to reinforce mod I/I skills acquired.  Pt ambulated to kitchen without assist/AD and performed simple meal prep of preparing eggs and toast independently.  Pt lifted heavy plates out of dishwasher and put away dishes independently.  Pt is ready for discharge.  Patient has met 9 of 9 long term goals due to improved activity tolerance, improved balance, postural control, functional use of  RIGHT upper extremity and improved coordination.  She was admitted at a min assist level,  Patient to discharge at overall Modified Independent to independent level.   Reasons goals not met: n/a  Recommendation:  Patient will benefit from ongoing skilled OT services in outpatient setting to continue to advance functional skills in the area of iADL and Vocation with a focus on dynamic standing balance and RUE strength, coordination, and pain control.  Equipment: No equipment provided  Reasons for discharge: treatment goals met  Patient/family agrees with progress made and goals achieved: Yes  OT Discharge Pain Pain Assessment Pain Assessment: No/denies pain Pain Score: 0-No pain ADL ADL Eating: Independent Where Assessed-Eating: Chair Grooming: Independent Where Assessed-Grooming: Sitting at sink Upper Body Bathing: Independent Where Assessed-Upper Body Bathing: Shower Lower Body Bathing: Modified independent Where Assessed-Lower Body Bathing: Shower Upper Body Dressing: Independent Where Assessed-Upper Body Dressing: Standing at sink Lower Body Dressing: Modified independent Where Assessed-Lower Body Dressing: Standing at sink;Edge of bed Toileting: Independent Where Assessed-Toileting: Teacher, adult education:  Community education officer Method: Proofreader: Engineer, manufacturing systems Transfer: Psychologist, counselling Method: Designer, industrial/product: Information systems manager with back Vision/Perception  Vision - History Baseline Vision: No visual deficits Vision - Naval architect: Within Programmer, multimedia: Within Functional Limits Praxis Praxis: Intact  Cognition Overall Cognitive Status: Appears within functional limits for tasks assessed [facility] for basic ADLs/ simple meal prep) Sensation Sensation Light Touch: Appears Intact Stereognosis: Appears Intact Hot/Cold: Appears Intact Proprioception: Appears Intact Coordination Gross Motor Movements are Fluid and Coordinated: No Fine Motor Movements are Fluid and Coordinated: No Coordination and Movement Description: Pt has made a great deal of progress since admission, but 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 Mobility  Mod I   Trunk/Postural Assessment  Cervical Assessment Cervical Assessment: Within Functional Limits Thoracic Assessment Thoracic Assessment: Within Functional Limits Lumbar Assessment Lumbar Assessment: Within Functional Limits  Balance Static Sitting Balance Static Sitting - Level of Assistance: 7: Independent Dynamic Sitting Balance Dynamic Sitting - Level of Assistance: 7: Independent Static Standing Balance Static Standing - Level of Assistance: 7: Independent Dynamic Standing Balance Dynamic Standing - Level of Assistance: 6: Modified independent (Device/Increase time);Other (comment) (uses countertop or sink to steady self with LB dressing) Extremity/Trunk Assessment RUE AROM (degrees) Right Shoulder Extension  0-60: 60 Degrees Right Shoulder Flexion  0-170: 160 Degrees (pain in shoulder after 30 flexion) Right Shoulder ABduction 0-140: 140 Degrees (pain with 30 flexion) Right Elbow  Flexion/Extension 0-135-150: 150  Right Forearm Pronation  0-80-90: 90 Degrees Right Forearm Supination  0-80-90: 80 Degrees Right Wrist Extension 0-70: 70 Degrees Right Wrist Flexion 0-80: 80 Degrees Right Wrist Radial Deviation 0-20: 20 Degrees Right Wrist Ulnar Deviation 0-30: 20 Degrees RUE Strength RUE Overall Strength Comments: 4/5 LUE  Assessment LUE Assessment: Within Functional Limits  See FIM for current functional status  Anna Lyons 04/29/2011, 10:21 AM

## 2011-04-29 NOTE — Progress Notes (Signed)
Physical Therapy Discharge Summary  Patient Details  Name: Anna Lyons MRN: [id number] Date of Birth: 16-Apr-1955  Today's Date: 04/29/2011 Time: 7829-5621 Time Calculation (min): 48 min  Patient has made excellent progress toward PT LTG and has met 8 of 8 long term goals due to improved balance, improved postural control, increased strength, ability to compensate for deficits, functional use of  right upper extremity and right lower extremity and improved coordination.  Patient's BERG balance score improved from 19/56 on acute care to 55/56 indicating low risk for falls.  Patient to discharge at an ambulatory level Modified Independent for basic transfers, floor transfers, household and community ambulation, ramp and stair negotiation.   Patient is independent with verbalizing all functional mobility sequences and patient's care partner (husband) is independent to provide the necessary supervision for stairs and car transfer assistance at discharge.  Reasons goals not met: All goals met   Recommendation:  Patient was given handout for standing LE strength and balance HEP but will benefit from ongoing skilled PT services in outpatient setting to continue to advance safe functional mobility, address ongoing impairments in R hemiplegia, impaired dynamic standing balance and gait, minimize fall risk and return to work and previous level of function.  Equipment: No equipment provided  Reasons for discharge: treatment goals met and discharge from hospital  Patient/family agrees with progress made and goals achieved: Yes  PT Discharge Pain Pain Assessment Pain Assessment: 0-10 Pain Score:   5 Pain Type: Acute pain Pain Location: Shoulder Pain Orientation: Right Pain Descriptors: Sharp Pain Onset: Gradual Pain Intervention(s): Other (Comment) (muscle rub cream) and discussed with OT patient's shoulder pain; OT feels patient presents with impaired muscle recruitment, timing and sequencing  causing the head of the humerus to impinge on glenoid fossa; coordinated with another OT to have shoulder and scapula taped in depression and ADD and to inhibit upper trap and pec major for improved timing, sequencing and biomechanics for joint conservation and pain management.    Cognition Overall Cognitive Status: Appears within functional limits for tasks assessed  Sensation Sensation Light Touch: Appears Intact Proprioception: Appears Intact Coordination Gross Motor Movements are Fluid and Coordinated: Yes Heel Shin Test: WNL at D/C Motor  Motor Motor: Hemiplegia Motor - Discharge Observations: Mild R sided weakness  Mobility Bed Mobility Supine to Sit: 7: Independent Sit to Supine: 7: Independent Transfers Stand Pivot Transfers: 6: Modified independent (Device/Increase time) Locomotion  Ambulation Ambulation/Gait Assistance: 6: Modified independent (Device/Increase time) Assistive device: None Gait Gait: Yes Gait velocity: 4.6 ft/sec indicative of safe Insurance account manager / Additional Locomotion Stairs: Yes Stairs Assistance: 6: Modified independent (Device/Increase time) Stair Management Technique: No rails Number of Stairs: 12  Height of Stairs: 8  Ramp: 6: Modified independent (Device) (ambulating up and down for home entry/exit) x 2 reps Curb: 6: Modified independent (Device/increase time) x 2 reps Trunk/Postural Assessment  Cervical Assessment Cervical Assessment: Within Functional Limits Thoracic Assessment Thoracic Assessment: Within Functional Limits Lumbar Assessment Lumbar Assessment: Within Functional Limits Postural Control Postural Control: Within Functional Limits  Balance Standardized Balance Assessment Standardized Balance Assessment: Berg Balance Test Berg Balance Test Sit to Stand: Able to stand without using hands and stabilize independently Standing Unsupported: Able to stand safely 2 minutes Sitting with Back Unsupported but Feet  Supported on Floor or Stool: Able to sit safely and securely 2 minutes Stand to Sit: Sits safely with minimal use of hands Transfers: Able to transfer safely, minor use of hands Standing Unsupported with Eyes Closed:  Able to stand 10 seconds safely Standing Ubsupported with Feet Together: Able to place feet together independently and stand 1 minute safely From Standing, Reach Forward with Outstretched Arm: Can reach confidently >25 cm (10") From Standing Position, Pick up Object from Floor: Able to pick up shoe safely and easily From Standing Position, Turn to Look Behind Over each Shoulder: Looks behind from both sides and weight shifts well Turn 360 Degrees: Able to turn 360 degrees safely one side only in 4 seconds or less Standing Unsupported, Alternately Place Feet on Step/Stool: Able to stand independently and safely and complete 8 steps in 20 seconds Standing Unsupported, One Foot in Front: Able to place foot tandem independently and hold 30 seconds Standing on One Leg: Able to lift leg independently and hold > 10 seconds Total Score: 55  Extremity Assessment  RLE Assessment RLE Assessment: Exceptions to [facility] RLE Strength RLE Overall Strength: Deficits RLE Overall Strength Comments: Slightly weaker than LLE: 4/5 throughout MMT; decreased functional strength in WB position LLE Assessment LLE Assessment: Within Functional Limits  See FIM for current functional status  Edman Circle [facility] 04/29/2011, 2:31 PM

## 2011-04-29 NOTE — Discharge Summary (Signed)
NAMEVIRGIN, ZELLERS NO.:  [id number]  MEDICAL RECORD NO.:  [id number]  LOCATION:  4029                         FACILITY:  [facility]  PHYSICIAN:  Erick Colace, M.D.DATE OF BIRTH:  1955-04-11  DATE OF ADMISSION:  04/23/2011 DATE OF DISCHARGE:  04/30/2011                              DISCHARGE SUMMARY   DISCHARGE DIAGNOSES: 1. Left subcortical infarction.  2. Hypertension. 3. Depression. 4. L shoulder pain bicipital tendinitis 5. Tobacco abuse.  HISTORY OF PRESENT ILLNESS:  This is a 56 year old right-handed female with history of transient ischemic attack, July 2012, who was admitted March 19 with right-sided weakness and slurred speech.  MRI showed acute nonhemorrhagic infarction, involving the posterior left lentiform nucleus as well as remote lacunar infarction in the basal ganglia bilaterally.  Carotid Dopplers with no internal carotid artery stenosis. Echocardiogram with ejection fraction of 60% without wall motion abnormalities.  Negative for emboli.  Cerebral angiogram with no occlusion or dissection noted.  The patient did receive tPA.  Neurology consulted, placed on aspirin therapy.  Recommendations of physical therapy as well as MD for physical medicine rehab admission.  PAST MEDICAL HISTORY:  See discharge diagnoses.  ALLERGIES:  CODEINE.  SOCIAL HISTORY:  Lives with spouse, 2 level home, bedroom on the 1st floor, multiple steps with a ramp entrance to the main level.  FUNCTIONAL HISTORY:  Prior to admission was independent.  She does not drive.  She works as a Conservation officer, nature at [employer].  Functional status upon admission to Rehab Service was minimal assist for scooting to the edge of bed, moderate assist ambulate 35 feet with handheld assistance.  PHYSICAL EXAMINATION:  VITAL SIGNS:  Blood pressure 120/76, pulse 59, temperature 98, respirations 20. GENERAL:  This was an alert female, in no acute distress, oriented x3, well  developed. LUNGS:  Clear to auscultation. CARDIAC:  Regular rate and rhythm. ABDOMEN:  Soft, nontender.  Good bowel sounds. NEUROLOGIC:  Speech was slightly dysarthric, but fully intelligible with right facial droop.  REHABILITATION AND HOSPITAL COURSE:  The patient was admitted to inpatient rehab services with therapies initiated on a 3-hour daily basis consisting of physical therapy, occupational therapy, speech therapy, and rehabilitation nursing.  The following issues were addressed during the patient's rehabilitation stay.  Pertaining to Mrs. Ludlam's left subcortical infarction remained stable, ongoing therapies as dictated per rehab services.  She remained on aspirin therapy. Sequential compression devices were in place for deep vein thrombosis prophylaxis.  Blood pressures were well controlled on hydrochlorothiazide with no orthostatic changes.  She did have a history of depression, maintained on Zoloft with emotional support provided. She was attending her full therapies.  She did have a history of tobacco abuse.  I did discuss with her the need for cessation of smoking and all risks associated with tobacco use.  The patient received weekly collaborative interdisciplinary team conferences to discuss estimated length of stay, family teaching, and any barriers to discharge.  She was continent of bowel and bladder, tolerating a regular diet.  Modified independent for toileting, dressing, bathing.  Supervision for shower stall transfers.  They are working on right upper extremity fine motor coordination and strength to  enable the patient to return to work as a Conservation officer, nature.  Her mobility overall was supervision.  She was able to communicate her needs.  Full family teaching was completed.  She is to be discharged home with family with ongoing therapies as dictated per rehab services.  LABORATORY DATA:  Latest labs showed a sodium 135, potassium 3.2 with supplement.  BUN 18, creatinine  0.6.  Hemoglobin 14, hematocrit 40.5 platelet 203,000.  DISCHARGE MEDICATIONS: 1. Aspirin 325 mg daily. 2. Hydrochlorothiazide 25 mg daily. 3. ReQuip 0.25 mg at bedtime. 4. Zoloft 50 mg daily. 5. Ultram 100 mg every 6 hours as needed for headache.  She was currently on potassium supplement for potassium 3.2 with followup chemistries pending with plan for potassium supplement as needed.  Her diet was regular.  She would follow up Dr. Claudette Laws at the outpatient rehab service office as advised, Dr. Pearlean Brownie in 1 month, Dr. Windle Guard medical management appointment to be made.     Mariam Dollar, P.A.   ______________________________ Erick Colace, M.D.    DA/MEDQ  D:  04/29/2011  T:  04/29/2011  Job:  [id number]  cc:   Windle Guard, M.D. Pramod P. Pearlean Brownie, MD

## 2011-04-29 NOTE — Progress Notes (Signed)
Social Work Patient ID: Anna Lyons, female   DOB: 1955-02-08, 56 y.o.   MRN: [id number]  Met with pt and husband who informed outing went well.  Both are tired from it. Gave pt letter sent to [facility] for her records.  Aware of OP Rehab Scheduled for 4/3.  Set for discharge tomorrow.

## 2011-04-29 NOTE — Discharge Instructions (Signed)
Inpatient Rehab Discharge Instructions  Anna Lyons Discharge date and time: No discharge date for patient encounter.   Activities/Precautions/ Functional Status: Activity: activity as tolerated Diet: regular diet Wound Care: none needed Functional status:  ___ No restrictions     ___ Walk up steps independently _x__ 24/7 supervision/assistance   __x_ Walk up steps with assistance ___ Intermittent supervision/assistance  ___ Bathe/dress independently ___ Walk with walker     ___ Bathe/dress with assistance ___ Walk Independently    ___ Shower independently __x_ Walk with assistance    _x__ Shower with assistance ___ No alcohol     ___ Return to work/school ________  Special Instructions:   COMMUNITY REFERRALS UPON DISCHARGE:    Outpatient: PT, OT  Agency: [facility] Phone: [phone] Date of Last Service: 04/30/2011  Appointment Date/Time: April 3 10:00-12:00  Medical Equipment/Items Ordered: NO NEEDS Other: APPLYING FOR MEDICAID AND DISABILITY--DAUGHTER APPLYING  GENERAL COMMUNITY RESOURCES FOR PATIENT/FAMILY: Support Groups: [facility] SUPPORT GROUP IF QUESTIONS OR CONCERNS ARISE, PLEASE CONTACTDossie Der, LCSW                                                                                                              [phone]   My questions have been answered and I understand these instructions. I will adhere to these goals and the provided educational materials after my discharge from the hospital.  Patient/Caregiver Signature _______________________________ Date __________  Clinician Signature _______________________________________ Date __________  Please bring this form and your medication list with you to all your follow-up doctor's appointments.

## 2011-04-29 NOTE — Progress Notes (Signed)
Occupational Therapy Note  Patient Details  Name: ALLORA BAINS MRN: [id number] Date of Birth: 1955/05/24 Today's Date: 04/29/2011  1030-1200 - 90 Minutes Co-Treatment with Recreational Therapy No complaints of pain  Community outing at [employer] for lunch. Focused skilled therapy on overall activity tolerance/endurance, functional use of right UE, family education -> spouse, and community re-entry prior to discharge home tomorrow. Patient ambulated without assistive device during entire outing, on/off van using steps, in/out restaurant, on uneven surfaces, transferring <-> toilet seat, toileting (perineal hygiene & clothing management), carrying tray from counter -> table. Also discussed energy conservation techniques and safety while out in the community. Patient at an overall modified independent -> independent level during outing. Patient met all goals that were set for outing; see hard copy in paper chart for more information.   Natalia Wittmeyer 04/29/2011, 12:59 PM

## 2011-04-29 NOTE — Progress Notes (Signed)
Speech Language Pathology Daily Session Note & Discharge Summary  Patient Details  Name: Anna Lyons MRN: [id number] Date of Birth: 1956-01-26  Today's Date: 04/29/2011 Time: 0930-1000 Time Calculation (min): 30 min  Short Term Goals: Week 1: SLP Short Term Goal 1 (Week 1): Pt will utilize speech intelligibility strategies with Mod I during conversational speech and be 100% intelligible.  SLP Short Term Goal 1 - Progress (Week 1): Progressing toward goal SLP Short Term Goal 2 (Week 1): Pt will demonstrate complex problem solving for functional tasks with Mod I. SLP Short Term Goal 2 - Progress (Week 1): Met SLP Short Term Goal 3 (Week 1): Pt will demonstrate emergent awareness into deficits and utilize call bell to call for assistance for wants/needs with Mod I.  SLP Short Term Goal 3 - Progress (Week 1): Progressing toward goal  Skilled Therapeutic Interventions: Session focused on speech intelligibility and high level cognition tasks; SLP facilitated session with  Phone call to patient who was mod I with speech intelligibility at the conversational level with slowed rate of speech; SLP also facilitated session with functional problem solving regarding outing this afternoon by asking patient to figure out what she will order for lunch since she has never been there.  Patient navigated Internet and made selections with increased wait time.     Daily Session Precautions/Restrictions    FIM:  Comprehension Comprehension Mode: Auditory Comprehension: 6-Follows complex conversation/direction: With extra time/assistive device Expression Expression Mode: Verbal Expression: 6-Expresses complex ideas: With extra time/assistive device Social Interaction Social Interaction: 6-Interacts appropriately with others with medication or extra time (anti-anxiety, antidepressant). Problem Solving Problem Solving: 6-Solves complex problems: With extra time Memory Memory: 6-More than reasonable amt of  time General    Pain Pain Assessment Pain Assessment: No/denies pain Pain Score: 0-No pain Cognition: Overall Cognitive Status: Appears within functional limits for tasks assessed [facility] for basic ADLs/ simple meal prep) Oral/Motor: Oral Motor/Sensory Function Overall Oral Motor/Sensory Function: Impaired Labial ROM: Reduced right Labial Symmetry: Abnormal symmetry right Labial Strength: Reduced Lingual ROM: Reduced right Lingual Symmetry: Abnormal symmetry right Lingual Strength: Reduced Lingual Sensation: Reduced Facial ROM: Reduced right Facial Symmetry: Right droop Facial Strength: Reduced Velum DO NOT USE: Within Functional Limits Mandible: Within Functional Limits Motor Speech Overall Motor Speech: Impaired Respiration: Within functional limits Phonation: Normal Resonance: Within functional limits Articulation: Impaired Level of Impairment: Conversation Intelligibility: Intelligibility reduced Word: 75-100% accurate Phrase: 75-100% accurate Sentence: 75-100% accurate Conversation: 75-100% accurate Motor Planning: Witnin functional limits Motor Speech Errors: Aware Effective Techniques: Over-articulate (slow rate) Comprehension: Auditory Comprehension Yes/No Questions: Within Functional Limits Commands: Within Functional Limits Conversation: Complex Visual Recognition/Discrimination Discrimination: Within Function Limits Reading Comprehension Reading Status: Not tested Expression: Expression Primary Mode of Expression: Verbal Verbal Expression Overall Verbal Expression: Appears within functional limits for tasks assessed Written Expression Dominant Hand: Right Written Expression:  (not tested secondary to weakness)  Therapy/Group: Individual Therapy  Speech Language Pathology Discharge Summary  Patient Details  Name: Anna Lyons MRN: [id number] Date of Birth: 1955/08/28  Patient has met 3 of 3 long term goals due to gains in cognitive abilities and  speech intelligibility.  Patient to discharge at overall Modified Independent level.  Patient's care partner is independent to provide the necessary transportation  assistance at discharge.  Reasons goals not met: n/a  Recommendation:  Patient requires no SLP follow up post CIR discharge.  Equipment: none  Reasons for discharge: treatment goals met and discharge from hospital  Patient/family agrees with progress  made and goals achieved: Yes  See FIM for current functional status  Charlane Ferretti., CCC-SLP [phone]

## 2011-04-29 NOTE — Progress Notes (Signed)
Recreational Therapy Session Note  Patient Details  Name: Anna Lyons MRN: [id number] Date of Birth: October 27, 1955 Today's Date: 04/29/2011 Time: 1030-12 Pain: no c/o Skilled Therapeutic Interventions/Progress Updates: Community reintegration/outing to [employer] ambulatory level walking on even/uneven surfaces, identifying and negotiating obstacle and energy conservation techniques.  Pt husband present and participatory.  Therapy/Group: Community Reintegration  Activity Level: Moderate:  Level of assist: Modified Independent  [name] 04/29/2011, 3:56 PM

## 2011-04-29 NOTE — Progress Notes (Signed)
Patient ID: Anna Lyons, female   DOB: April 15, 1955, 56 y.o.   MRN: [id number]  Subjective/Complaints:  R shoulder pain improved.  Objective: Vital Signs: Blood pressure 113/75, pulse 59, temperature 98 F (36.7 C), temperature source Oral, resp. rate 18, weight 63.277 kg (139 lb 8 oz), SpO2 95.00%. No results found. Results for orders placed during the hospital encounter of 04/23/11 (from the past 72 hour(s))  CBC     Status: Normal   Collection Time   04/26/11  9:49 AM      Component Value Range Comment   WBC 5.1  4.0 - 10.5 (K/uL)    RBC 4.12  3.87 - 5.11 (MIL/uL)    Hemoglobin 14.0  12.0 - 15.0 (g/dL)    HCT 40.9  81.1 - 91.4 (%)    MCV 98.3  78.0 - 100.0 (fL)    MCH 34.0  26.0 - 34.0 (pg)    MCHC 34.6  30.0 - 36.0 (g/dL)    RDW 78.2  95.6 - 21.3 (%)    Platelets 203  150 - 400 (K/uL)   COMPREHENSIVE METABOLIC PANEL     Status: Abnormal   Collection Time   04/26/11  9:49 AM      Component Value Range Comment   Sodium 134 (*) 135 - 145 (mEq/L)    Potassium 2.8 (*) 3.5 - 5.1 (mEq/L)    Chloride 93 (*) 96 - 112 (mEq/L)    CO2 28  19 - 32 (mEq/L)    Glucose, Bld 118 (*) 70 - 99 (mg/dL)    BUN 18  6 - 23 (mg/dL)    Creatinine, Ser 0.86  0.50 - 1.10 (mg/dL)    Calcium 57.8  8.4 - 10.5 (mg/dL)    Total Protein 7.6  6.0 - 8.3 (g/dL)    Albumin 3.7  3.5 - 5.2 (g/dL)    AST 26  0 - 37 (U/L)    ALT 19  0 - 35 (U/L)    Alkaline Phosphatase 73  39 - 117 (U/L)    Total Bilirubin 0.5  0.3 - 1.2 (mg/dL)    GFR calc non Af Amer >90  >90 (mL/min)    GFR calc Af Amer >90  >90 (mL/min)   DIFFERENTIAL     Status: Normal   Collection Time   04/26/11  9:49 AM      Component Value Range Comment   Neutrophils Relative 67  43 - 77 (%)    Neutro Abs 3.4  1.7 - 7.7 (K/uL)    Lymphocytes Relative 21  12 - 46 (%)    Lymphs Abs 1.0  0.7 - 4.0 (K/uL)    Monocytes Relative 11  3 - 12 (%)    Monocytes Absolute 0.6  0.1 - 1.0 (K/uL)    Eosinophils Relative 1  0 - 5 (%)    Eosinophils Absolute 0.1   0.0 - 0.7 (K/uL)    Basophils Relative 0  0 - 1 (%)    Basophils Absolute 0.0  0.0 - 0.1 (K/uL)   BASIC METABOLIC PANEL     Status: Abnormal   Collection Time   04/27/11  5:50 AM      Component Value Range Comment   Sodium 135  135 - 145 (mEq/L)    Potassium 3.2 (*) 3.5 - 5.1 (mEq/L)    Chloride 96  96 - 112 (mEq/L)    CO2 29  19 - 32 (mEq/L)    Glucose, Bld 90  70 - 99 (  mg/dL)    BUN 18  6 - 23 (mg/dL)    Creatinine, Ser 1.61  0.50 - 1.10 (mg/dL)    Calcium 9.7  8.4 - 10.5 (mg/dL)    GFR calc non Af Amer >90  >90 (mL/min)    GFR calc Af Amer >90  >90 (mL/min)      Gen: NAD HEENT: + HOH, +Dysarthria Lungs:  Clear Cor:  RRR no M Abd:+BS soft NT Ext - C/C/E Motor:  3/5 R delt bi, tri,grip 3/5 R HF, KE, Ankle DF Sensory intact MSK Pain over bicipital groove Pain to palp over R foot/ankle , -pain with ROM, no erythema   Assessment/Plan: 1. Functional deficits secondary to L basal ganglia infarct which require 3+ hours per day of interdisciplinary therapy in a comprehensive inpatient rehab setting. Physiatrist is providing close team supervision and 24 hour management of active medical problems listed below. Physiatrist and rehab team continue to assess barriers to discharge/monitor patient progress toward functional and medical goals.  Plan home in am FIM: FIM - Bathing Bathing Steps Patient Completed: Chest;Left lower leg (including foot);Right Arm;Left Arm;Abdomen;Front perineal area;Buttocks;Right upper leg;Left upper leg;Right lower leg (including foot) Bathing: 6: More than reasonable amount of time  FIM - Upper Body Dressing/Undressing Upper body dressing/undressing steps patient completed: Thread/unthread right sleeve of pullover shirt/dresss;Thread/unthread left sleeve of pullover shirt/dress;Put head through opening of pull over shirt/dress;Pull shirt over trunk Upper body dressing/undressing: 7: Complete Independence: No helper FIM - Lower Body  Dressing/Undressing Lower body dressing/undressing steps patient completed: Thread/unthread right underwear leg;Thread/unthread left underwear leg;Pull underwear up/down;Don/Doff right sock;Pull pants up/down;Thread/unthread left pants leg;Thread/unthread right pants leg;Don/Doff right shoe;Don/Doff left shoe;Don/Doff left sock;Fasten/unfasten right shoe;Fasten/unfasten left shoe Lower body dressing/undressing: 6: More than reasonable amount of time  FIM - Toileting Toileting steps completed by patient: Adjust clothing prior to toileting;Performs perineal hygiene;Adjust clothing after toileting Toileting Assistive Devices: Grab bar or rail for support Toileting: 6: More than reasonable amount of time  FIM - Diplomatic Services operational officer Devices: Art gallery manager Transfers: 6-More than reasonable amt of time  FIM - Banker Devices: Therapist, occupational: 6: Supine > Sit: No assist;6: Sit > Supine: No assist;6: Bed > Chair or W/C: No assist;6: Chair or W/C > Bed: No assist  FIM - Locomotion: Wheelchair Distance: 35 Locomotion: Wheelchair: 0: Activity did not occur FIM - Locomotion: Ambulation Locomotion: Ambulation Assistive Devices: IT consultant (comment) (nothing) Ambulation/Gait Assistance: 5: Supervision Locomotion: Ambulation: 5: Travels 150 ft or more with supervision/safety issues  Comprehension Comprehension Mode: Auditory Comprehension: 6-Follows complex conversation/direction: With extra time/assistive device  Expression Expression Mode: Verbal Expression: 6-Expresses complex ideas: With extra time/assistive device  Social Interaction Social Interaction: 6-Interacts appropriately with others with medication or extra time (anti-anxiety, antidepressant).  Problem Solving Problem Solving: 6-Solves complex problems: With extra time  Memory Memory: 6-More than reasonable amt of time  Medical Problem List and  Plan:  1. 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 Work on ROM for now with therapies and during down time. She has reasonable underlying strength.  2. DVT Prophylaxis/Anticoagulation: SCDs. Monitor for any signs of deep vein thrombosis  3. Hypertension. Hydrochlorothiazide change 50 mg daily to 25 mg. Monitor with increased mobility. Watch weights, i's and o's with diuretic on board.  4. Depression. Zoloft. Provide emotional support and positive reinforcement. Seems to be up beat and has a supportive family network.  5. Tobacco abuse. Consult to tobacco cessation  counselor obtained while on acute care services  6.  R bicipital tendinitis vs shoulder OA Xray show mild AC joint OA improved with sports cream and ice 7.  R foot pain mild - exam likely metatarsalgia  LOS (Days) 6 A FACE TO FACE EVALUATION WAS PERFORMED  KIRSTEINS,ANDREW E 04/29/2011, 8:46 AM     Review of Systems  Musculoskeletal: Positive for joint pain.       R shoulder and R anterior foot

## 2011-04-29 NOTE — Progress Notes (Signed)
Social Work Discharge Note Discharge Note  The overall goal for the admission was met for:   Discharge location: Yes-HOME WITH HUSBAND DAUGHTER TO CHECK FREQUENTLY ON  Length of Stay: Yes-7 DAYS  Discharge activity level: Yes-INDEPENDENT LEVEL  Home/community participation: Yes  Services provided included: MD, RD, PT, OT, SLP, RN, CM, TR, Pharmacy and SW  Financial Services: Other: SELF PAY-3 WEEKS AT [employer]  Follow-up services arranged: Outpatient: [facility] 10:00-12:00  Comments (or additional information):DAUGHTER TO FOLLOW UP SSD AND MEDICAID APPLICATIONS VERY PLEASED WITH PROGRESS HERE  Patient/Family verbalized understanding of follow-up arrangements: Yes  Individual responsible for coordination of the follow-up plan: DAWN-DAUGHTER AND PATIENT  Confirmed correct DME delivered: Lucy Chris 04/29/2011    Lucy Chris

## 2011-04-29 NOTE — Progress Notes (Signed)
Recreational Therapy Discharge Summary Patient Details  Name: Anna Lyons MRN: [id number] Date of Birth: December 16, 1955 Today's Date: 04/29/2011  Long term goals set: 2  Long term goals met: 2  Comments on progress toward goals: Pt has made excellent progress toward goals meeting 2/2.  Pt is being discharged home with husband at Mod I level.  Reasons goals not met: n/a  Equipment acquired: n/a  Reasons for discharge: discharge from hospital  Patient/family agrees with progress made and goals achieved: Yes  Chastidy Ranker 04/29/2011, 3:55 PM

## 2011-04-30 LAB — BASIC METABOLIC PANEL
Calcium: 9.7 mg/dL (ref 8.4–10.5)
GFR calc non Af Amer: >90 mL/min (ref >90)
Potassium: 4.2 mEq/L (ref 3.5–5.1)
Sodium: 138 mEq/L (ref 135–145)

## 2011-04-30 MED ORDER — POTASSIUM CHLORIDE CRYS ER 10 MEQ PO TBCR: 10.0000 meq | EXTENDED_RELEASE_TABLET | Freq: Every day | ORAL | Status: DC

## 2011-04-30 MED ORDER — POTASSIUM CHLORIDE CRYS ER 10 MEQ PO TBCR
10.0000 meq | EXTENDED_RELEASE_TABLET | Freq: Every day | ORAL | Status: DC
  Filled 2011-04-30 (×2): qty 1

## 2011-04-30 MED ORDER — ROPINIROLE HCL 0.25 MG PO TABS: 0.2500 mg | ORAL_TABLET | Freq: Every day | ORAL | Status: DC

## 2011-04-30 MED ORDER — HYDROCHLOROTHIAZIDE 25 MG PO TABS: 25.0000 mg | ORAL_TABLET | Freq: Every day | ORAL | Status: DC

## 2011-04-30 MED ORDER — TRAMADOL HCL 50 MG PO TABS: 100.0000 mg | ORAL_TABLET | Freq: Four times a day (QID) | ORAL | Status: AC | PRN

## 2011-04-30 MED ORDER — ASPIRIN 325 MG PO TBEC: 325.0000 mg | DELAYED_RELEASE_TABLET | Freq: Every day | ORAL | Status: AC

## 2011-04-30 NOTE — Progress Notes (Signed)
Patient ID: Anna Lyons, female   DOB: 08/20/1955, 56 y.o.   MRN: [id number]  Subjective/Complaints:  R shoulder pain improved.  Objective: Vital Signs: Blood pressure 127/70, pulse 72, temperature 97.3 F (36.3 C), temperature source Oral, resp. rate 18, weight 63.277 kg (139 lb 8 oz), SpO2 95.00%. No results found. Results for orders placed during the hospital encounter of 04/23/11 (from the past 72 hour(s))  BASIC METABOLIC PANEL     Status: Normal   Collection Time   04/30/11  6:35 AM      Component Value Range Comment   Sodium 138  135 - 145 (mEq/L)    Potassium 4.2  3.5 - 5.1 (mEq/L)    Chloride 100  96 - 112 (mEq/L)    CO2 29  19 - 32 (mEq/L)    Glucose, Bld 89  70 - 99 (mg/dL)    BUN 16  6 - 23 (mg/dL)    Creatinine, Ser 8.65  0.50 - 1.10 (mg/dL)    Calcium 9.7  8.4 - 10.5 (mg/dL)    GFR calc non Af Amer >90  >90 (mL/min)    GFR calc Af Amer >90  >90 (mL/min)      Gen: NAD HEENT: + HOH, +Dysarthria Lungs:  Clear Cor:  RRR no M Abd:+BS soft NT Ext - C/C/Lyons Motor:  3/5 R delt bi, tri,grip 3/5 R HF, KE, Ankle DF Sensory intact MSK Pain over bicipital groove Pain to palp over R foot/ankle , -pain with ROM, no erythema   Assessment/Plan: 1. Functional deficits secondary to L basal ganglia infarct   s table for D/CFIM: FIM - Bathing Bathing Steps Patient Completed: Chest;Left lower leg (including foot);Right Arm;Left Arm;Abdomen;Front perineal area;Buttocks;Right upper leg;Left upper leg;Right lower leg (including foot) Bathing: 6: More than reasonable amount of time  FIM - Upper Body Dressing/Undressing Upper body dressing/undressing steps patient completed: Thread/unthread right sleeve of pullover shirt/dresss;Thread/unthread left sleeve of pullover shirt/dress;Put head through opening of pull over shirt/dress;Pull shirt over trunk Upper body dressing/undressing: 7: Complete Independence: No helper FIM - Lower Body Dressing/Undressing Lower body dressing/undressing  steps patient completed: Thread/unthread right underwear leg;Thread/unthread left underwear leg;Pull underwear up/down;Don/Doff right sock;Pull pants up/down;Thread/unthread left pants leg;Thread/unthread right pants leg;Don/Doff right shoe;Don/Doff left shoe;Don/Doff left sock;Fasten/unfasten right shoe;Fasten/unfasten left shoe Lower body dressing/undressing: 6: More than reasonable amount of time  FIM - Toileting Toileting steps completed by patient: Adjust clothing prior to toileting Toileting Assistive Devices: Grab bar or rail for support Toileting: 6: Assistive device: No helper  FIM - Diplomatic Services operational officer Devices: Elevated toilet seat Toilet Transfers: 6-Assistive device: No helper (outing setting)  FIM - Banker Devices: Therapist, occupational: 7: Supine > Sit: No assist;7: Sit > Supine: No assist;6: Chair or W/C > Bed: No assist;6: Bed > Chair or W/C: No assist  FIM - Locomotion: Wheelchair Distance: 35 Locomotion: Wheelchair: 0: Activity did not occur FIM - Locomotion: Ambulation Locomotion: Ambulation Assistive Devices: IT consultant (comment) (nothing) Ambulation/Gait Assistance: 6: Modified independent (Device/Increase time) Locomotion: Ambulation: 6: Travels 150 ft or more independently/takes more than reasonable amount of time  Comprehension Comprehension Mode: Auditory Comprehension: 6-Follows complex conversation/direction: With extra time/assistive device  Expression Expression Mode: Verbal Expression: 6-Expresses complex ideas: With extra time/assistive device  Social Interaction Social Interaction: 6-Interacts appropriately with others with medication or extra time (anti-anxiety, antidepressant).  Problem Solving Problem Solving: 6-Solves complex problems: With extra time  Memory Memory: 6-More than reasonable amt of time  Medical Problem List and Plan:  1. 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 Work on ROM for now with therapies and during down time. She has reasonable underlying strength.  2. DVT Prophylaxis/Anticoagulation: SCDs. Monitor for any signs of deep vein thrombosis  3. Hypertension. Hydrochlorothiazide change 50 mg daily to 25 mg. Monitor with increased mobility. Watch weights, i's and o's with diuretic on board.  4. Depression. Zoloft. Provide emotional support and positive reinforcement. Seems to be up beat and has a supportive family network.  5. Tobacco abuse. Consult to tobacco cessation counselor obtained while on acute care services  6.  R bicipital tendinitis/Subacromial bursitis vs shoulder OA Xray show mild AC joint OA improved with sports cream and ice 7.  R foot pain mild - exam likely metatarsalgia  LOS (Days) 7 A FACE TO FACE EVALUATION WAS PERFORMED  Anna Lyons 04/30/2011, 8:11 AM     Review of Systems  Musculoskeletal: Positive for joint pain.       R shoulder and R anterior foot    Addendum R subacromial injection  Indication R shoulder pain not responsive to PT and oral meds  Procedure R Lat approach.  Betadine prep followed by injection of 1 cc 40mg /cc Kenalog and 4 cc 1% lidocaine Pt tolerated procedure well.

## 2011-04-30 NOTE — Progress Notes (Signed)
Patient discharged home today.  Discharge instructions given by Nickola Major, PA.  Patient was escorted out by staff with family

## 2011-05-05 ENCOUNTER — Ambulatory Visit: Payer: Self-pay | Admitting: Occupational Therapy

## 2011-05-05 ENCOUNTER — Ambulatory Visit: Payer: Self-pay | Attending: Physical Medicine & Rehabilitation | Admitting: Physical Therapy

## 2011-05-05 DIAGNOSIS — I69998 Other sequelae following unspecified cerebrovascular disease: Secondary | ICD-10-CM | POA: Insufficient documentation

## 2011-05-05 DIAGNOSIS — R269 Unspecified abnormalities of gait and mobility: Secondary | ICD-10-CM | POA: Insufficient documentation

## 2011-05-05 DIAGNOSIS — R279 Unspecified lack of coordination: Secondary | ICD-10-CM | POA: Insufficient documentation

## 2011-05-05 DIAGNOSIS — Z5189 Encounter for other specified aftercare: Secondary | ICD-10-CM | POA: Insufficient documentation

## 2011-05-05 DIAGNOSIS — M6281 Muscle weakness (generalized): Secondary | ICD-10-CM | POA: Insufficient documentation

## 2011-05-07 ENCOUNTER — Ambulatory Visit: Payer: Self-pay | Admitting: Occupational Therapy

## 2011-05-07 ENCOUNTER — Ambulatory Visit: Payer: Self-pay | Admitting: Physical Therapy

## 2011-05-12 ENCOUNTER — Ambulatory Visit: Payer: Self-pay | Admitting: Occupational Therapy

## 2011-05-12 ENCOUNTER — Encounter: Payer: Self-pay | Admitting: Physical Medicine & Rehabilitation

## 2011-05-12 ENCOUNTER — Ambulatory Visit: Payer: Self-pay | Admitting: Physical Therapy

## 2011-05-14 ENCOUNTER — Encounter: Payer: Managed Care, Other (non HMO) | Admitting: Occupational Therapy

## 2011-05-17 ENCOUNTER — Ambulatory Visit: Payer: Self-pay | Admitting: Occupational Therapy

## 2011-05-17 ENCOUNTER — Ambulatory Visit: Payer: Self-pay | Admitting: Physical Therapy

## 2011-05-19 ENCOUNTER — Other Ambulatory Visit: Payer: Self-pay

## 2011-05-19 ENCOUNTER — Encounter: Payer: Managed Care, Other (non HMO) | Admitting: Occupational Therapy

## 2011-05-19 ENCOUNTER — Emergency Department (HOSPITAL_COMMUNITY): Payer: Medicaid Other

## 2011-05-19 ENCOUNTER — Ambulatory Visit: Payer: Self-pay | Admitting: Physical Therapy

## 2011-05-19 ENCOUNTER — Inpatient Hospital Stay (HOSPITAL_COMMUNITY)
Admission: EM | Admit: 2011-05-19 | Discharge: 2011-05-20 | DRG: 206 | Disposition: A | Discharge status: Home / Self Care | Payer: Medicaid Other | Attending: Internal Medicine | Admitting: Internal Medicine

## 2011-05-19 ENCOUNTER — Encounter (HOSPITAL_COMMUNITY): Payer: Self-pay | Admitting: *Deleted

## 2011-05-19 DIAGNOSIS — I639 Cerebral infarction, unspecified: Secondary | ICD-10-CM

## 2011-05-19 DIAGNOSIS — R55 Syncope and collapse: Secondary | ICD-10-CM

## 2011-05-19 DIAGNOSIS — I10 Essential (primary) hypertension: Secondary | ICD-10-CM

## 2011-05-19 DIAGNOSIS — I6350 Cerebral infarction due to unspecified occlusion or stenosis of unspecified cerebral artery: Secondary | ICD-10-CM

## 2011-05-19 DIAGNOSIS — Z7982 Long term (current) use of aspirin: Secondary | ICD-10-CM

## 2011-05-19 DIAGNOSIS — R079 Chest pain, unspecified: Secondary | ICD-10-CM

## 2011-05-19 DIAGNOSIS — R9431 Abnormal electrocardiogram [ECG] [EKG]: Secondary | ICD-10-CM

## 2011-05-19 DIAGNOSIS — F32A Depression, unspecified: Secondary | ICD-10-CM

## 2011-05-19 DIAGNOSIS — Z8673 Personal history of transient ischemic attack (TIA), and cerebral infarction without residual deficits: Secondary | ICD-10-CM

## 2011-05-19 DIAGNOSIS — G2581 Restless legs syndrome: Secondary | ICD-10-CM | POA: Diagnosis present

## 2011-05-19 DIAGNOSIS — I200 Unstable angina: Secondary | ICD-10-CM

## 2011-05-19 DIAGNOSIS — F331 Major depressive disorder, recurrent, moderate: Secondary | ICD-10-CM | POA: Diagnosis present

## 2011-05-19 DIAGNOSIS — F17200 Nicotine dependence, unspecified, uncomplicated: Secondary | ICD-10-CM | POA: Diagnosis present

## 2011-05-19 DIAGNOSIS — F329 Major depressive disorder, single episode, unspecified: Secondary | ICD-10-CM | POA: Diagnosis present

## 2011-05-19 DIAGNOSIS — M940 Chondrocostal junction syndrome [Tietze]: Principal | ICD-10-CM | POA: Diagnosis present

## 2011-05-19 DIAGNOSIS — Z720 Tobacco use: Secondary | ICD-10-CM

## 2011-05-19 DIAGNOSIS — F339 Major depressive disorder, recurrent, unspecified: Secondary | ICD-10-CM | POA: Diagnosis present

## 2011-05-19 DIAGNOSIS — R0789 Other chest pain: Secondary | ICD-10-CM | POA: Diagnosis present

## 2011-05-19 DIAGNOSIS — F3289 Other specified depressive episodes: Secondary | ICD-10-CM | POA: Diagnosis present

## 2011-05-19 DIAGNOSIS — I6319 Cerebral infarction due to embolism of other precerebral artery: Secondary | ICD-10-CM

## 2011-05-19 HISTORY — DX: Angina pectoris, unspecified: I20.9

## 2011-05-19 HISTORY — DX: Transient cerebral ischemic attack, unspecified: G45.9

## 2011-05-19 HISTORY — DX: Tobacco use: Z72.0

## 2011-05-19 LAB — DIFFERENTIAL
Basophils Absolute: 0 10*3/uL (ref 0.0–0.1)
Lymphocytes Relative: 23 % (ref 12–46)
Lymphs Abs: 1.4 10*3/uL (ref 0.7–4.0)
Neutro Abs: 3.8 10*3/uL (ref 1.7–7.7)

## 2011-05-19 LAB — POCT I-STAT TROPONIN I

## 2011-05-19 LAB — COMPREHENSIVE METABOLIC PANEL
ALT: 12 U/L (ref 0–35)
AST: 17 U/L (ref 0–37)
Alkaline Phosphatase: 71 U/L (ref 39–117)
CO2: 31 mEq/L (ref 19–32)
Chloride: 97 mEq/L (ref 96–112)
GFR calc non Af Amer: >90 mL/min (ref >90)
Potassium: 3.8 mEq/L (ref 3.5–5.1)
Sodium: 137 mEq/L (ref 135–145)
Total Bilirubin: 0.4 mg/dL (ref 0.3–1.2)

## 2011-05-19 LAB — CARDIAC PANEL(CRET KIN+CKTOT+MB+TROPI)
CK, MB: 1.5 ng/mL (ref 0.3–4.0)
Total CK: 58 U/L (ref 7–177)

## 2011-05-19 LAB — URINALYSIS, ROUTINE W REFLEX MICROSCOPIC
Bilirubin Urine: NEGATIVE
Hgb urine dipstick: NEGATIVE
Ketones, ur: NEGATIVE mg/dL
Specific Gravity, Urine: 1.02 (ref 1.005–1.030)
Urobilinogen, UA: 0.2 mg/dL (ref 0.0–1.0)

## 2011-05-19 LAB — TSH: TSH: 3.151 u[IU]/mL (ref 0.350–4.500)

## 2011-05-19 LAB — CBC
Platelets: 148 10*3/uL — ABNORMAL LOW (ref 150–400)
RBC: 3.72 MIL/uL — ABNORMAL LOW (ref 3.87–5.11)
RDW: 12.3 % (ref 11.5–15.5)
WBC: 5.9 10*3/uL (ref 4.0–10.5)

## 2011-05-19 LAB — HEMOGLOBIN A1C: Hgb A1c MFr Bld: 5.3 % (ref <5.7)

## 2011-05-19 MED ORDER — HYDROCHLOROTHIAZIDE 25 MG PO TABS
25.0000 mg | ORAL_TABLET | Freq: Every day | ORAL | Status: DC
  Filled 2011-05-19: qty 1

## 2011-05-19 MED ORDER — SODIUM CHLORIDE 0.9 % IJ SOLN
3.0000 mL | Freq: Two times a day (BID) | INTRAMUSCULAR | Status: DC
  Administered 2011-05-19 – 2011-05-20 (×2): 3 mL via INTRAVENOUS

## 2011-05-19 MED ORDER — ACETAMINOPHEN 650 MG RE SUPP: 650.0000 mg | Freq: Four times a day (QID) | RECTAL | Status: DC | PRN

## 2011-05-19 MED ORDER — SERTRALINE HCL 50 MG PO TABS
50.0000 mg | ORAL_TABLET | Freq: Every day | ORAL | Status: DC
  Administered 2011-05-20: 50 mg via ORAL
  Filled 2011-05-19: qty 1

## 2011-05-19 MED ORDER — MORPHINE SULFATE 2 MG/ML IJ SOLN: 1.0000 mg | INTRAMUSCULAR | Status: DC | PRN

## 2011-05-19 MED ORDER — ASPIRIN EC 325 MG PO TBEC
325.0000 mg | DELAYED_RELEASE_TABLET | Freq: Every day | ORAL | Status: DC
  Administered 2011-05-20: 325 mg via ORAL
  Filled 2011-05-19: qty 1

## 2011-05-19 MED ORDER — ACETAMINOPHEN 325 MG PO TABS: 650.0000 mg | ORAL_TABLET | Freq: Four times a day (QID) | ORAL | Status: DC | PRN

## 2011-05-19 MED ORDER — ATORVASTATIN CALCIUM 40 MG PO TABS
40.0000 mg | ORAL_TABLET | Freq: Every day | ORAL | Status: DC
  Administered 2011-05-19: 40 mg via ORAL
  Filled 2011-05-19 (×2): qty 1

## 2011-05-19 MED ORDER — ROPINIROLE HCL 0.5 MG PO TABS
0.5000 mg | ORAL_TABLET | Freq: Every day | ORAL | Status: DC
  Administered 2011-05-19: 0.5 mg via ORAL
  Filled 2011-05-19 (×2): qty 1

## 2011-05-19 MED ORDER — PNEUMOCOCCAL VAC POLYVALENT 25 MCG/0.5ML IJ INJ
0.5000 mL | INJECTION | Freq: Once | INTRAMUSCULAR | Status: DC
  Filled 2011-05-19: qty 0.5

## 2011-05-19 MED ORDER — PANTOPRAZOLE SODIUM 40 MG PO TBEC
40.0000 mg | DELAYED_RELEASE_TABLET | Freq: Every day | ORAL | Status: DC
  Administered 2011-05-19: 40 mg via ORAL
  Filled 2011-05-19: qty 1

## 2011-05-19 MED ORDER — ENOXAPARIN SODIUM 40 MG/0.4ML ~~LOC~~ SOLN
40.0000 mg | SUBCUTANEOUS | Status: DC
  Administered 2011-05-19: 40 mg via SUBCUTANEOUS
  Filled 2011-05-19 (×2): qty 0.4

## 2011-05-19 MED ORDER — HYDROCHLOROTHIAZIDE 50 MG PO TABS: 50.0000 mg | ORAL_TABLET | Freq: Every day | ORAL | Status: DC

## 2011-05-19 MED ORDER — SODIUM CHLORIDE 0.9 % IV SOLN: 1.0000 mL/kg/h | INTRAVENOUS | Status: DC

## 2011-05-19 MED ORDER — SODIUM CHLORIDE 0.9 % IV SOLN
INTRAVENOUS | Status: DC
  Administered 2011-05-19: 21:00:00 via INTRAVENOUS

## 2011-05-19 MED ORDER — HYDROCODONE-ACETAMINOPHEN 5-325 MG PO TABS: 1.0000 | ORAL_TABLET | ORAL | Status: DC | PRN

## 2011-05-19 NOTE — ED Provider Notes (Signed)
History     CSN: [id number]  Arrival date & time 05/19/11  1029   First MD Initiated Contact with Patient 05/19/11 1040      11:11 AM HPI Anna Lyons is a 56 y.o. female complaining of substernal CP that began last night while watching TV. Describes pain as throbbing and sharp. Associated with nausea. Denies SOB, back pain, abdominal pain, fevers, coughing. No h/o MI, DM, hyperlipidemia. H/o CVA last month  Patient is a 56 y.o. female presenting with chest pain. The history is provided by the patient.  Chest Pain The chest pain began yesterday. Chest pain occurs constantly. The chest pain is worsening. The severity of the pain is moderate. The quality of the pain is described as stabbing. The pain does not radiate. Primary symptoms include nausea. Pertinent negatives for primary symptoms include no fever, no fatigue, no shortness of breath, no cough, no wheezing, no abdominal pain, no vomiting and no dizziness.  Pertinent negatives for associated symptoms include no diaphoresis, no numbness and no weakness. She tried aspirin for the symptoms.  Her past medical history is significant for hyperlipidemia and strokes.  Pertinent negatives for past medical history include no MI.     Past Medical History  Diagnosis Date  . Depression   . Hypertension   . Stroke     TIA in 08/2010    Past Surgical History  Procedure Date  . Mastectomy     bilateral with reconstruction in the 80s    History reviewed. No pertinent family history.  History  Substance Use Topics  . Smoking status: Current Everyday Smoker -- 0.2 packs/day for 20 years    Types: Cigarettes  . Smokeless tobacco: Not on file  . Alcohol Use: 0.6 oz/week    1 Glasses of wine per week    OB History    Grav Para Term Preterm Abortions TAB SAB Ect Mult Living                  Review of Systems  Constitutional: Negative for fever, diaphoresis and fatigue.  HENT: Negative for neck pain.   Respiratory: Negative for  cough, shortness of breath and wheezing.   Cardiovascular: Positive for chest pain. Negative for leg swelling.  Gastrointestinal: Positive for nausea. Negative for vomiting and abdominal pain.  Neurological: Negative for dizziness, weakness, numbness and headaches.  All other systems reviewed and are negative.    Allergies  Codeine  Home Medications   Current Outpatient Rx  Name Route Sig Dispense Refill  . ACETAMINOPHEN ER 650 MG PO TBCR Oral Take 650 mg by mouth every 4 (four) hours as needed. For pain.    . ASPIRIN EC 325 MG PO TBEC Oral Take 325 mg by mouth daily.    [name] HYDROCHLOROTHIAZIDE 25 MG PO TABS Oral Take 25 mg by mouth daily.    [name] HYDROCHLOROTHIAZIDE 50 MG PO TABS Oral Take 50 mg by mouth daily.    [name] PANTOPRAZOLE SODIUM 40 MG PO TBEC Oral Take 40 mg by mouth at bedtime.    [name] ROPINIROLE HCL 0.5 MG PO TABS Oral Take 0.5 mg by mouth at bedtime.    Bernadette Hoit SODIUM 8.6-50 MG PO TABS Oral Take 1 tablet by mouth at bedtime as needed. For constipation.    . SERTRALINE HCL 50 MG PO TABS Oral Take 50 mg by mouth daily.    . TRAMADOL HCL 50 MG PO TABS Oral Take 100 mg by mouth every 6 (six) hours as  needed. For pain.      BP 114/66  Pulse 64  Temp(Src) 98.2 F (36.8 C) (Oral)  Resp 15  Ht 5\' 5"  (1.651 m)  Wt 143 lb (64.864 kg)  BMI 23.80 kg/m2  SpO2 98%  Physical Exam  Vitals reviewed. Constitutional: She is oriented to person, place, and time. Vital signs are normal. She appears well-developed and well-nourished. No distress.  HENT:  Head: Normocephalic and atraumatic.  Eyes: Pupils are equal, round, and reactive to light.  Neck: Neck supple.  Cardiovascular: Normal rate, regular rhythm and normal heart sounds.  Exam reveals no gallop and no friction rub.   No murmur heard. Pulmonary/Chest: Effort normal. No respiratory distress. She has no wheezes. She has no rales. She exhibits no tenderness.  Abdominal: Soft. Bowel sounds are normal. She exhibits no  distension and no mass. There is no tenderness. There is no rebound and no guarding.  Neurological: She is alert and oriented to person, place, and time.  Skin: Skin is warm and dry. No rash noted. No erythema. No pallor.  Psychiatric: She has a normal mood and affect. Her behavior is normal.    ED Course  Procedures  Results for orders placed during the hospital encounter of 05/19/11  CBC      Component Value Range   WBC 5.9  4.0 - 10.5 (K/uL)   RBC 3.72 (*) 3.87 - 5.11 (MIL/uL)   Hemoglobin 12.5  12.0 - 15.0 (g/dL)   HCT 16.1  09.6 - 04.5 (%)   MCV 96.8  78.0 - 100.0 (fL)   MCH 33.6  26.0 - 34.0 (pg)   MCHC 34.7  30.0 - 36.0 (g/dL)   RDW 40.9  81.1 - 91.4 (%)   Platelets 148 (*) 150 - 400 (K/uL)  DIFFERENTIAL      Component Value Range   Neutrophils Relative 66  43 - 77 (%)   Neutro Abs 3.8  1.7 - 7.7 (K/uL)   Lymphocytes Relative 23  12 - 46 (%)   Lymphs Abs 1.4  0.7 - 4.0 (K/uL)   Monocytes Relative 9  3 - 12 (%)   Monocytes Absolute 0.5  0.1 - 1.0 (K/uL)   Eosinophils Relative 2  0 - 5 (%)   Eosinophils Absolute 0.1  0.0 - 0.7 (K/uL)   Basophils Relative 0  0 - 1 (%)   Basophils Absolute 0.0  0.0 - 0.1 (K/uL)  COMPREHENSIVE METABOLIC PANEL      Component Value Range   Sodium 137  135 - 145 (mEq/L)   Potassium 3.8  3.5 - 5.1 (mEq/L)   Chloride 97  96 - 112 (mEq/L)   CO2 31  19 - 32 (mEq/L)   Glucose, Bld 80  70 - 99 (mg/dL)   BUN 19  6 - 23 (mg/dL)   Creatinine, Ser 7.82  0.50 - 1.10 (mg/dL)   Calcium 9.8  8.4 - 95.6 (mg/dL)   Total Protein 7.1  6.0 - 8.3 (g/dL)   Albumin 3.7  3.5 - 5.2 (g/dL)   AST 17  0 - 37 (U/L)   ALT 12  0 - 35 (U/L)   Alkaline Phosphatase 71  39 - 117 (U/L)   Total Bilirubin 0.4  0.3 - 1.2 (mg/dL)   GFR calc non Af Amer >90  >90 (mL/min)   GFR calc Af Amer >90  >90 (mL/min)  URINALYSIS, ROUTINE W REFLEX MICROSCOPIC      Component Value Range   Color, Urine YELLOW  YELLOW  APPearance CLEAR  CLEAR    Specific Gravity, Urine 1.020   1.005 - 1.030    pH 6.0  5.0 - 8.0    Glucose, UA NEGATIVE  NEGATIVE (mg/dL)   Hgb urine dipstick NEGATIVE  NEGATIVE    Bilirubin Urine NEGATIVE  NEGATIVE    Ketones, ur NEGATIVE  NEGATIVE (mg/dL)   Protein, ur NEGATIVE  NEGATIVE (mg/dL)   Urobilinogen, UA 0.2  0.0 - 1.0 (mg/dL)   Nitrite NEGATIVE  NEGATIVE    Leukocytes, UA NEGATIVE  NEGATIVE   POCT I-STAT TROPONIN I      Component Value Range   Troponin i, poc 0.00  0.00 - 0.08 (ng/mL)   Comment 3            Dg Chest 2 View  05/19/2011  *RADIOLOGY REPORT*  Clinical Data: Chest pain.  CHEST - 2 VIEW  Comparison: 04/20/2011  Findings: There is hyperinflation of the lungs compatible with COPD.  Heart and mediastinal contours are within normal limits.  No focal opacities or effusions.  No acute bony abnormality.  IMPRESSION: COPD.  No active disease.  Original Report Authenticated By: Cyndie Chime, M.D.   Dg Shoulder Right  04/26/2011  *RADIOLOGY REPORT*  Clinical Data: Right anterior shoulder pain, right CVA  RIGHT SHOULDER - 2+ VIEW  Comparison: The chest radiograph 08/1958 1012  Findings: There is no fracture or dislocation of the right shoulder.  There is mild degenerative change of the acromioclavicular joint.  IMPRESSION: 1. No acute findings  of the right shoulder.  2.  Mild degenerative change at the acromioclavicular joint.  Original Report Authenticated By: Genevive Bi, M.D.   Ct Head Wo Contrast  04/20/2011  *RADIOLOGY REPORT*  Clinical Data: Right-sided weakness.  Evaluate for stroke.  CT HEAD WITHOUT CONTRAST  Technique:  Contiguous axial images were obtained from the base of the skull through the vertex without contrast.  Comparison: Head CT 08/08/2010  Findings: Hypodensity in the right putamen is unchanged from prior CT and may reflect a remote lacunar infarction.  Minimal subcortical white matter hypoattenuation bilaterally is stable. There is no hemorrhage.  No evidence of acute cortically based infarction.  The ventricles  are normal and stable in size.  The visualized paranasal sinuses and mastoid air cells are clear.  The skull is intact.  IMPRESSION: 1.  Stable head CT.  No acute intracranial abnormality identified. 2.  Stable mild subcortical white matter hypoattenuation, likely reflecting chronic microvascular ischemia. 3.  Stable, remote right putamen lacunar infarct.  Original Report Authenticated By: Britta Mccreedy, M.D.   Mr Brain Wo Contrast  04/21/2011  *RADIOLOGY REPORT*  Clinical Data: Stroke.  Status post t-PA.  MRI HEAD WITHOUT CONTRAST  Technique:  Multiplanar, multiecho pulse sequences of the brain and surrounding structures were obtained according to standard protocol without intravenous contrast.  Comparison: CT head without contrast 04/20/2011.  Findings: An acute non hemorrhagic infarct is present within the left lentiform nucleus.  There are more remote lacunar infarcts immediately adjacent to this area as well as a remote lacunar infarct in the right lentiform nucleus.  Mild periventricular and scattered subcortical T2 and FLAIR hyperintensities otherwise are noted.  Flow is present in the major intracranial arteries.  No mass lesion is present.  No focal hemorrhage is evident.  The paranasal sinuses and left mastoid air cells are clear.  There is fluid in the right mastoid air cells.  No obstructing nasopharyngeal lesion is evident.  IMPRESSION:  1.  Acute non  hemorrhagic infarct involving the posterior left lentiform nucleus. 2.  More remote lacunar infarcts are present in the basal ganglia bilaterally. 3.  Mild atrophy and white matter disease.  This likely reflects the sequelae of chronic microvascular ischemia. 4.  Right mastoid effusion.  No obstructing nasopharyngeal lesion is evident.  Original Report Authenticated By: Jamesetta Orleans. MATTERN, M.D.   Dg Chest Portable 1 View  04/20/2011  *RADIOLOGY REPORT*  Clinical Data: Stroke symptoms  PORTABLE CHEST - 1 VIEW  Comparison: 08/07/2010  Findings:  Borderline cardiomegaly.  Clear lungs.  Bronchitic changes are stable.  No edema.  No consolidation.  No pneumothorax or pleural effusion.  IMPRESSION: Borderline cardiomegaly without decompensation.  Original Report Authenticated By: Donavan Burnet, M.D.   Ir Angio Intra Extracran Sel Com Carotid Innominate Bilat Mod Sed  04/22/2011  *RADIOLOGY REPORT*  Clinical Data: Acute right-sided weakness with dysarthria.  BILATERAL COMMON CAROTID AND BILATERAL VERTEBRAL ARTERY ANGIOGRAMS  Comparison: CT scan of the brain of 04/20/2011.  Following a full explanation of the procedure along with the potential associated complications, an informed witnessed consent was obtained.  The right groin was prepped and draped in the usual sterile fashion.  Thereafter using modified Seldinger technique, transfemoral access into the right common femoral artery was obtained without difficulty.  Over a 0.035-inch guidewire, 5-French Pinnacle sheath was inserted.  Through this and also over a 0.035- inch guidewire, a 5-French JB1 catheter was advanced to the aortic arch region and selectively positioned in the innominate artery, the right common carotid artery, the left common carotid artery and the left vertebral artery.  There were no acute complications.  The patient tolerated the procedure well.  Medications utilized: Versed 1 mg IV.  Fentanyl 25 mcg IV.  Contrast: Omnipaque-300 approximately 40 ml.  Findings:  The right common carotid arteriogram demonstrates the right external carotid artery to be mildly narrowed at its origin.  Its branches are normally opacified.  The right internal carotid artery at the bulb to the cranial skull base opacifies normally.  The petrous, the cavernous and the supraclinoid segments are normal.  The right middle and the right anterior cerebral arteries opacify normally into the capillary and venous phases.  A prominent right posterior communicating artery is seen opacifying the right posterior cerebral  artery distribution.  The right vertebral artery origin is normal.  The vessel opacifies normally to the cranial skull base.  There is normal opacification of the right posterior inferior cerebellar artery and the right vertebrobasilar junction.  The opacified portion of the basilar artery, the left posterior cerebral artery, and the anterior-inferior cerebellar arteries is grossly normal into the capillary and venous phases.  Unopacified blood is seen in the basilar artery from the contralateral vertebral artery.  The left common carotid arteriogram demonstrates the left external carotid artery and its major branches to be normal.  The left internal carotid artery at the bulb to the cranial skull base opacifies normally.  The petrous, the cavernous and the supraclinoid segments are normal.  A left posterior communicating artery is seen opacifying the left posterior cerebral artery distribution.  The left middle and the left anterior cerebral arteries opacify normally into the capillary and venous phases.  The left vertebral artery origin is normal.  The vessel has mild tortuosity proximally.  More distally the vessel opacifies normally to the cranial skull base.  There is normal opacification of the left posterior-inferior cerebellar artery and the left vertebrobasilar junction.  Fenestration of the left vertebrobasilar junction is  seen.  The opacified portions of the basilar artery, the superior cerebellar arteries and the anterior-inferior cerebellar arteries are normal into the capillary and venous phases.  IMPRESSION: 1.  Angiographically no evidence of occlusions, stenoses, dissections or aneurysms seen. 2.  No angiographic evidence of intraluminal filling defects seen.  Original Report Authenticated By: Oneal Grout, M.D.   Ir Angio Vertebral Sel Subclavian Innominate Uni R Mod Sed  04/22/2011  *RADIOLOGY REPORT*  Clinical Data: Acute right-sided weakness with dysarthria.  BILATERAL COMMON CAROTID  AND BILATERAL VERTEBRAL ARTERY ANGIOGRAMS  Comparison: CT scan of the brain of 04/20/2011.  Following a full explanation of the procedure along with the potential associated complications, an informed witnessed consent was obtained.  The right groin was prepped and draped in the usual sterile fashion.  Thereafter using modified Seldinger technique, transfemoral access into the right common femoral artery was obtained without difficulty.  Over a 0.035-inch guidewire, 5-French Pinnacle sheath was inserted.  Through this and also over a 0.035- inch guidewire, a 5-French JB1 catheter was advanced to the aortic arch region and selectively positioned in the innominate artery, the right common carotid artery, the left common carotid artery and the left vertebral artery.  There were no acute complications.  The patient tolerated the procedure well.  Medications utilized: Versed 1 mg IV.  Fentanyl 25 mcg IV.  Contrast: Omnipaque-300 approximately 40 ml.  Findings:  The right common carotid arteriogram demonstrates the right external carotid artery to be mildly narrowed at its origin.  Its branches are normally opacified.  The right internal carotid artery at the bulb to the cranial skull base opacifies normally.  The petrous, the cavernous and the supraclinoid segments are normal.  The right middle and the right anterior cerebral arteries opacify normally into the capillary and venous phases.  A prominent right posterior communicating artery is seen opacifying the right posterior cerebral artery distribution.  The right vertebral artery origin is normal.  The vessel opacifies normally to the cranial skull base.  There is normal opacification of the right posterior inferior cerebellar artery and the right vertebrobasilar junction.  The opacified portion of the basilar artery, the left posterior cerebral artery, and the anterior-inferior cerebellar arteries is grossly normal into the capillary and venous phases.  Unopacified  blood is seen in the basilar artery from the contralateral vertebral artery.  The left common carotid arteriogram demonstrates the left external carotid artery and its major branches to be normal.  The left internal carotid artery at the bulb to the cranial skull base opacifies normally.  The petrous, the cavernous and the supraclinoid segments are normal.  A left posterior communicating artery is seen opacifying the left posterior cerebral artery distribution.  The left middle and the left anterior cerebral arteries opacify normally into the capillary and venous phases.  The left vertebral artery origin is normal.  The vessel has mild tortuosity proximally.  More distally the vessel opacifies normally to the cranial skull base.  There is normal opacification of the left posterior-inferior cerebellar artery and the left vertebrobasilar junction.  Fenestration of the left vertebrobasilar junction is seen.  The opacified portions of the basilar artery, the superior cerebellar arteries and the anterior-inferior cerebellar arteries are normal into the capillary and venous phases.  IMPRESSION: 1.  Angiographically no evidence of occlusions, stenoses, dissections or aneurysms seen. 2.  No angiographic evidence of intraluminal filling defects seen.  Original Report Authenticated By: Oneal Grout, M.D.   Ir Angio Vertebral Sel Vertebral Uni  L Mod Sed  04/22/2011  *RADIOLOGY REPORT*  Clinical Data: Acute right-sided weakness with dysarthria.  BILATERAL COMMON CAROTID AND BILATERAL VERTEBRAL ARTERY ANGIOGRAMS  Comparison: CT scan of the brain of 04/20/2011.  Following a full explanation of the procedure along with the potential associated complications, an informed witnessed consent was obtained.  The right groin was prepped and draped in the usual sterile fashion.  Thereafter using modified Seldinger technique, transfemoral access into the right common femoral artery was obtained without difficulty.  Over a  0.035-inch guidewire, 5-French Pinnacle sheath was inserted.  Through this and also over a 0.035- inch guidewire, a 5-French JB1 catheter was advanced to the aortic arch region and selectively positioned in the innominate artery, the right common carotid artery, the left common carotid artery and the left vertebral artery.  There were no acute complications.  The patient tolerated the procedure well.  Medications utilized: Versed 1 mg IV.  Fentanyl 25 mcg IV.  Contrast: Omnipaque-300 approximately 40 ml.  Findings:  The right common carotid arteriogram demonstrates the right external carotid artery to be mildly narrowed at its origin.  Its branches are normally opacified.  The right internal carotid artery at the bulb to the cranial skull base opacifies normally.  The petrous, the cavernous and the supraclinoid segments are normal.  The right middle and the right anterior cerebral arteries opacify normally into the capillary and venous phases.  A prominent right posterior communicating artery is seen opacifying the right posterior cerebral artery distribution.  The right vertebral artery origin is normal.  The vessel opacifies normally to the cranial skull base.  There is normal opacification of the right posterior inferior cerebellar artery and the right vertebrobasilar junction.  The opacified portion of the basilar artery, the left posterior cerebral artery, and the anterior-inferior cerebellar arteries is grossly normal into the capillary and venous phases.  Unopacified blood is seen in the basilar artery from the contralateral vertebral artery.  The left common carotid arteriogram demonstrates the left external carotid artery and its major branches to be normal.  The left internal carotid artery at the bulb to the cranial skull base opacifies normally.  The petrous, the cavernous and the supraclinoid segments are normal.  A left posterior communicating artery is seen opacifying the left posterior cerebral  artery distribution.  The left middle and the left anterior cerebral arteries opacify normally into the capillary and venous phases.  The left vertebral artery origin is normal.  The vessel has mild tortuosity proximally.  More distally the vessel opacifies normally to the cranial skull base.  There is normal opacification of the left posterior-inferior cerebellar artery and the left vertebrobasilar junction.  Fenestration of the left vertebrobasilar junction is seen.  The opacified portions of the basilar artery, the superior cerebellar arteries and the anterior-inferior cerebellar arteries are normal into the capillary and venous phases.  IMPRESSION: 1.  Angiographically no evidence of occlusions, stenoses, dissections or aneurysms seen. 2.  No angiographic evidence of intraluminal filling defects seen.  Original Report Authenticated By: Oneal Grout, M.D.     MDM    Spoke with patient about labs and imaging. Will have patient admitted to [facility] for CP r/o. I spoke with Dr. Ahmed Prima who will see the patient for admission.      Thomasene Lot, PA-C 05/19/11 1434  ED ECG REPORT   Date: 05/19/2011  EKG Time: 2:34 PM  Rate: 69  Rhythm: normal sinus rhythm,  no significant changes since last EKG  on April 20 2011  Axis: Left axis  Intervals:left anterior fascicular block  ST&T Change: Nonspecific T wave abnormalities             Thomasene Lot, PA-C 05/19/11 1436

## 2011-05-19 NOTE — Consult Note (Signed)
CARDIOLOGY CONSULT NOTE  Patient ID: Anna Lyons MRN: [id number], DOB/AGE: [birth year]   Admit date: 05/19/2011 Date of Consult: 05/19/2011   Primary Physician: Kaleen Mask, MD, MD Primary Cardiologist: New  Pt. Profile  56 y/o female with recent h/o CVA who presents to [facility] w/ complaints of c/p & syncope.  Problem List  Past Medical History  Diagnosis Date  . Depression   . Hypertension   . Angina   . Restless leg syndrome   . TIA (transient ischemic attack) 08/2010  . Stroke 04/20/2011    a. 04/20/11 Left cubcortical infarct treated w/ TPA;  b. 04/2011 Echo: EF 55-60%;  c. 04/2011 Normal Carotid u/s  d. Residual "walk w/limp on right; unable to grasp w/right hand"  . Tobacco abuse     a. quit @ time of CVA 04/2011.    Past Surgical History  Procedure Date  . Mastectomy 1980's    bilateral with reconstruction  . Breast reconstruction     bilaterally  . Cesarean section 1979  . Knee arthroscopy 1990's    left; cartilage repair  . Carpal tunnel release 1990's    left    Allergies  Allergies  Allergen Reactions  . Codeine Itching   HPI   56 y/o female with the above complex problem list.  In March of this year, she was admitted w/ right sided wkns and found to have a CVA.  This was successfully treated w/ TPA.  Echo was nl and carotid u/s showed no obstructive dzs.  She spent some time in rehab and is currently going to outpt rehab.  Starting last night, she began to experience brief episodes of substernal chest tightness (7/10) associated with sob, lasting a few secs and resolving spontaneously.  This occurred several times over the course of the night and woke her up several times.  This AM, after awakening and showering, she felt nauseated.  Upon driving to physical therapy, she began to feel profoundly lightheaded - to the point that she pulled over and her husband took over.  They continued onto PT and upon arrival, she felt more LH.  She was initially placed  in a wheelchair and then was laid flat when she apparently briefly lost consciousness.  Per pt and family, they were told that she momentarily stopped breathing.  EMS was called and pt was taken to [facility] ED where CE were nl but ECG showed new anterior TWI.  She was admitted by medicine and we've been asked to eval.  She denies c/p @ this time.  Inpatient Medications     . aspirin EC  325 mg Oral Daily  . enoxaparin  40 mg Subcutaneous Q24H  . hydrochlorothiazide  50 mg Oral Daily  . pantoprazole  40 mg Oral QHS  . pneumococcal 23 valent vaccine  0.5 mL Intramuscular Once  . rOPINIRole  0.5 mg Oral QHS  . sertraline  50 mg Oral Daily  . sodium chloride  3 mL Intravenous Q12H    Family History Family History  Problem Relation Age of Onset  . Lupus Mother     died early 13's.  . Emphysema Father     died late 76's.     Social History History   Social History  . Marital Status: Married    Spouse Name: N/A    Number of Children: N/A  . Years of Education: N/A   Occupational History  . Not on file.   Social History Main Topics  .  Smoking status: Former Smoker -- 0.5 packs/day for 37 years    Types: Cigarettes    Quit date: 04/20/2011  . Smokeless tobacco: Never Used   Comment: quit 04/20/2011.  [name] Alcohol Use: No  . Drug Use: No  . Sexually Active: No   Other Topics Concern  . Not on file   Social History Narrative   Lives @ home locally with husband.     Review of Systems  General:  No chills, fever, night sweats or weight changes.  Cardiovascular:  +++ chest pain & dyspnea as outlined in the HPI.  No edema, orthopnea, palpitations, paroxysmal nocturnal dyspnea. Dermatological: No rash, lesions/masses Respiratory: No cough. Urologic: No hematuria, dysuria Abdominal:   +++ nausea as outlined in the HPI.  No vomiting, diarrhea, bright red blood per rectum, melena, or hematemesis Neurologic:  No visual changes, wkns, changes in mental status.  Residual mild rue/rle  wkns - walks with limp. All other systems reviewed and are otherwise negative except as noted above.  Physical Exam  Blood pressure 105/69, pulse 70, temperature 98 F (36.7 C), temperature source Oral, resp. rate 20, height 5\' 5"  (1.651 m), weight 143 lb (64.864 kg), SpO2 97.00%.  General: Pleasant, NAD Psych: Normal affect. Neuro: Alert and oriented X 3. Moves all extremities spontaneously. Rue/rle 3/5.  r facial droop. HEENT: Normal  Neck: Supple without bruits or JVD. Lungs:  Resp regular and unlabored, CTA. Heart: RRR no s3, s4, or murmurs. Abdomen: Soft, non-tender, non-distended, BS + x 4.  Extremities: No clubbing, cyanosis or edema. DP/PT/Radials 2+ and equal bilaterally.  Labs   [facility] 05/19/11 1619  CKTOTAL 58  CKMB 1.5  TROPONINI <0.30   Lab Results  Component Value Date   WBC 5.9 05/19/2011   HGB 12.5 05/19/2011   HCT 36.0 05/19/2011   MCV 96.8 05/19/2011   PLT 148* 05/19/2011     Lab 05/19/11 1158  NA 137  K 3.8  CL 97  CO2 31  BUN 19  CREATININE 0.72  CALCIUM 9.8  PROT 7.1  BILITOT 0.4  ALKPHOS 71  ALT 12  AST 17  GLUCOSE 80   Lab Results  Component Value Date   CHOL 144 04/21/2011   HDL 44 04/21/2011   LDLCALC 82 04/21/2011   TRIG 88 04/21/2011    Radiology/Studies  Dg Chest 2 View  05/19/2011  *RADIOLOGY REPORT*  Clinical Data: Chest pain.  CHEST - 2 VIEW  Comparison: 04/20/2011  Findings: There is hyperinflation of the lungs compatible with COPD.  Heart and mediastinal contours are within normal limits.  No focal opacities or effusions.  No acute bony abnormality.  IMPRESSION: COPD.  No active disease.  Original Report Authenticated By: Cyndie Chime, M.D.   ECG  Sinus, LAD, LAFB, new TWI ant leads.  ASSESSMENT AND PLAN  1.  [place]:  Typical and atypical features to recurrent chest pain with abnormal ECG today (Ant TWI) new since March.  With recent h/o CVA, Ss concerning for ischemia/angina.  Discussed at length with pt and family re:  risks of cath given recent stroke.  Pt is willing to proceed.  We will plan for this in the AM.  F/U CE.  Hold off on anticoagulation unless CE positive.  Cont asa, add statin.  2.  Syncope:  ? Etiology.  NL ef by echo last month.  Follow tele.  Cath tomorrow.  3.  Recent CVA:  Per medicine.  Ongoing PT as outpt.  4.  Tob. Abuse:  Ceased.  5.  HTN:  Stable.  Note pts HCTZ dose decreased to 25 mg daily at time of last d/c, will reduce.  6.  HL:  LDL 82.  Add statin.   Signed, Nicolasa Ducking, NP 05/19/2011, 7:59 PM   Attending note:  Patient seen and examined. Reviewed records and database as recorded by Mr. Brion Aliment. Anna Lyons is a 56 year old woman with a history of hypertension, tobacco abuse, previous TIA, and stroke, including a left subcortical infarct treated with TPA on March 19 of this year. She is currently undergoing rehabilitation with some residual right arm and leg weakness. MRI did not show any hemorrhagic conversion, and she has been on aspirin since that time.  She is admitted to the hospital now on the hospitalist service with recent complaints of a dull intermittent chest discomfort, began yesterday, has awakened her at night, and has generally been worse today. She did not mention these symptoms to her husband initially, but today while going to her rehabilitation, she became profoundly weak, felt "flushed" and had to lie down at the facility. Husband states that she actually "passed out" for a brief time. Still having intermittent chest pain at that time.  Under observation she has had some recurrent episodes of flushing and weakness. Telemetry has been unrevealing. ECG however has been abnormal showing progressive anterolateral T wave inversions, new since tracing in March of this year. Echocardiogram from March showed an LVEF of 55-60% without regional wall motion abnormalities. Her initial cardiac markers are normal at this time. Chest x-ray shows changes consistent with  COPD, no acute findings.  On examination she is hemodynamically stable, lungs are clear with diminished breath sounds, cardiac exam with regular rate and rhythm and S4, no rub, no peripheral edema. Has residual weakness right hand and leg.  Laboratory data reviewed finding potassium 3.8, BUN 19, creatinine 0.7, AST 17, ALT of 12, hemoglobin 12.5, platelets 148.  Lipid profile from March revealed LDL 82, HDL 44. She has not been on statin therapy.  Symptoms are concerning for unstable angina, particularly with new ECG abnormalities since March. Situation was discussed in detail with the patient, her husband, and her daughter present. In light of her progressive and recurring symptoms, we discussed proceeding to a diagnostic cardiac catheterization tomorrow for clear definition of her coronary anatomy and assessment for revascularization options. In light of the fact that she did have a stroke approximately one month ago, stroke risk with her procedure will be somewhat higher. Also discussed other risks including bleeding, heart attack, and infection. We also discussed possibility of medical therapy with Myoview for noninvasive assessment of ischemia, however if she continues to have symptoms and stress testing is abnormal, we will be faced with similar situation of considering cardiac catheterization in short order. After reviewing these issues, she is in agreement to proceed with cardiac catheterization tomorrow. This is being scheduled with Dr. Excell Seltzer. Plan is to continue aspirin, add statin, hold off beta blocker with good blood pressure and heart rate at this time. If her cardiac marker trend becomes abnormal would add heparin. We did not load with Plavix.  Jonelle Sidle, M.D., F.A.C.C.

## 2011-05-19 NOTE — H&P (Signed)
Hospital Admission Note Date: 05/19/2011  Patient name: Anna Lyons           Medical record number: [id number] Date of birth: 1956/01/22           Age: 56 y.o.   Gender: female    PCP:   Kaleen Mask, MD, MD   Chief Complaint:  Chest pain  HPI: Anna Lyons is a 56 y.o. female with past medical history depression, hypertension and recent stroke. Patient came into the hospital because of chest pain. Patient was in her usual state of hospital last night. When she had transient chest pain. Patient did not pay too much attention to it. Until this morning when she was driving to get her physical therapy and she felt the pain again. Patient mentioned the pain as 7/10, sharp, no radiation, did not remember anything increases or decreases the pain. She felt nauseous, but did not vomit. She denies palpitations, cough, shortness of breath or vomiting. Upon initial evaluation in the emergency department first set of cardiac enzymes were negative and 12-lead EKG is negative too.  Past Medical History: Past Medical History  Diagnosis Date  . Depression   . Hypertension   . Angina   . Restless leg syndrome   . Stroke 08/2010    TIA   . Stroke 04/20/2011    residual "walk w/limp on right; unable to grasp w/right hand"   Past Surgical History  Procedure Date  . Mastectomy 1980's    bilateral with reconstruction  . Breast reconstruction     bilaterally  . Cesarean section 1979  . Knee arthroscopy 1990's    left; cartilage repair  . Carpal tunnel release 1990's    left    Medications: Prior to Admission medications   Medication Sig Start Date End Date Taking? Authorizing Provider  acetaminophen (TYLENOL) 650 MG CR tablet Take 650 mg by mouth every 4 (four) hours as needed. For pain.   Yes Historical Provider, MD  aspirin EC 325 MG tablet Take 325 mg by mouth daily.   Yes Historical Provider, MD  hydrochlorothiazide (HYDRODIURIL) 25 MG tablet Take 25 mg by mouth daily.  05/19/11 Yes  Daniel J Angiulli, PA  hydrochlorothiazide (HYDRODIURIL) 50 MG tablet Take 50 mg by mouth daily.   Yes Historical Provider, MD  pantoprazole (PROTONIX) 40 MG tablet Take 40 mg by mouth at bedtime.   Yes Historical Provider, MD  rOPINIRole (REQUIP) 0.5 MG tablet Take 0.5 mg by mouth at bedtime.   Yes Historical Provider, MD  senna-docusate (SENOKOT-S) 8.6-50 MG per tablet Take 1 tablet by mouth at bedtime as needed. For constipation.   Yes Historical Provider, MD  sertraline (ZOLOFT) 50 MG tablet Take 50 mg by mouth daily.   Yes Historical Provider, MD  traMADol (ULTRAM) 50 MG tablet Take 100 mg by mouth every 6 (six) hours as needed. For pain.   Yes Historical Provider, MD    Allergies:   Allergies  Allergen Reactions  . Codeine Itching    Social History:  reports that she quit smoking about 4 weeks ago. Her smoking use included Cigarettes. She has a 18.5 pack-year smoking history. She has never used smokeless tobacco. She reports that she drinks about 8.4 ounces of alcohol per week. She reports that she does not use illicit drugs.  Family History: History reviewed. No pertinent family history.  Review of Systems:  Constitutional: negative for anorexia, fevers and sweats Eyes: negative for irritation, redness and visual disturbance Ears, nose,  mouth, throat, and face: negative for earaches, epistaxis, nasal congestion and sore throat Respiratory: negative for cough, dyspnea on exertion, sputum and wheezing Cardiovascular: negative for chest pain, dyspnea, lower extremity edema, orthopnea, palpitations and syncope Gastrointestinal: negative for abdominal pain, constipation, diarrhea, melena, nausea and vomiting Genitourinary:negative for dysuria, frequency and hematuria Hematologic/lymphatic: negative for bleeding, easy bruising and lymphadenopathy Musculoskeletal:negative for arthralgias, muscle weakness and stiff joints Neurological: negative for coordination problems, gait problems,  headaches and weakness Endocrine: negative for diabetic symptoms including polydipsia, polyuria and weight loss Allergic/Immunologic: negative for anaphylaxis, hay fever and urticaria  Physical Exam: BP 132/76  Pulse 64  Temp(Src) 98.2 F (36.8 C) (Oral)  Resp 16  Ht 5\' 5"  (1.651 m)  Wt 64.864 kg (143 lb)  BMI 23.80 kg/m2  SpO2 97% General appearance: alert, cooperative and no distress  Head: Normocephalic, without obvious abnormality, atraumatic  Eyes: conjunctivae/corneas clear. PERRL, EOM's intact. Fundi benign.  Nose: Nares normal. Septum midline. Mucosa normal. No drainage or sinus tenderness.  Throat: lips, mucosa, and tongue normal; teeth and gums normal  Neck: Supple, no masses, no cervical lymphadenopathy, no JVD appreciated, no meningeal signs Resp: clear to auscultation bilaterally  Chest wall: no tenderness  Cardio: regular rate and rhythm, S1, S2 normal, no murmur, click, rub or gallop  GI: soft, non-tender; bowel sounds normal; no masses, no organomegaly  Extremities: extremities normal, atraumatic, no cyanosis or edema  Skin: Skin color, texture, turgor normal. No rashes or lesions  Neurologic: Alert and oriented X 3, normal strength and tone. Normal symmetric reflexes. Normal coordination and gait  Labs on Admission:   [facility] 05/19/11 1158  NA 137  K 3.8  CL 97  CO2 31  GLUCOSE 80  BUN 19  CREATININE 0.72  CALCIUM 9.8  MG --  PHOS --    Basename 05/19/11 1158  AST 17  ALT 12  ALKPHOS 71  BILITOT 0.4  PROT 7.1  ALBUMIN 3.7   No results found for this basename: LIPASE:2,AMYLASE:2 in the last 72 hours  Basename 05/19/11 1158  WBC 5.9  NEUTROABS 3.8  HGB 12.5  HCT 36.0  MCV 96.8  PLT 148*    Radiological Exams on Admission: Dg Chest 2 View  05/19/2011  *RADIOLOGY REPORT*  Clinical Data: Chest pain.  CHEST - 2 VIEW  Comparison: 04/20/2011  Findings: There is hyperinflation of the lungs compatible with COPD.  Heart and mediastinal contours  are within normal limits.  No focal opacities or effusions.  No acute bony abnormality.  IMPRESSION: COPD.  No active disease.  Original Report Authenticated By: Cyndie Chime, M.D.   IMPRESSION: Present on Admission:  .Chest pain .Depression  Assessment/Plan  Chest pain Has atypical chest pain, did not get better with rest or nitroglycerin. Patient pain comes and goes. Patient is already on aspirin. She takes hydrochlorothiazide for her blood pressure. I will rule out acute coronary syndrome by 3 sets of cardiac enzymes. A repeat EKG in the morning. If acute syndrome ruled out by cardiac enzymes and patient is pain-free she can probably followup as outpatient with cardiology.  HTN This is controlled with admission blood pressure medications.  Recent CVA Pressure is controlled with hydrochlorothiazide, patient is on aspirin. Fasting lipid profile to be checked.  Kern Gingras A 05/19/2011, 4:32 PM

## 2011-05-19 NOTE — Progress Notes (Signed)
T wave inversion noted in leads V1 - V6, different from previous EKG done last March 19,2013.  Kindly address. Thank you. Ancil Linsey RN

## 2011-05-19 NOTE — Progress Notes (Signed)
Per pt family request ,if consulting with Cardiologist, they would prefer [facility] for consult. Thanks! Ancil Linsey RN

## 2011-05-19 NOTE — ED Notes (Signed)
Pt undressed, in gown, on monitor, continuous pulse oximetry and blood pressure cuff; EKG performed 

## 2011-05-19 NOTE — ED Notes (Signed)
[id number] Ready 

## 2011-05-19 NOTE — ED Provider Notes (Signed)
Medical screening examination/treatment/procedure(s) were performed by non-physician practitioner and as supervising physician I was immediately available for consultation/collaboration.   Benny Lennert, MD 05/19/11 1520

## 2011-05-19 NOTE — ED Notes (Signed)
EMS-pt reports midsternal stabbing chest pain last night that went away after tums. Pt is coming from rehab, for stroke in march, and had the same discomfort. Pt reports pain goes away when she lays down. Pt given 324asa and 20g(L)hand. Pt states she feels like she can't take a deep breath and had nausea with the pain when she was at rehab.

## 2011-05-19 NOTE — ED Notes (Addendum)
Pt complains of chest pain that started last night while watching TV. Chest pain was a 7/10 last night and currently reports no pain right now. She has a history of stroke, but passed the stroke [place] exam. Right arm are weak from previous stroke. Pt's husband states she stumbles a lot from the right leg. She has a history of a mastectomy on both breasts. EKG shows NSR.

## 2011-05-20 ENCOUNTER — Encounter (HOSPITAL_COMMUNITY)
Admission: EM | Disposition: A | Discharge status: Home / Self Care | Payer: Self-pay | Source: Home / Self Care | Attending: Internal Medicine

## 2011-05-20 ENCOUNTER — Inpatient Hospital Stay (HOSPITAL_COMMUNITY): Payer: Medicaid Other

## 2011-05-20 DIAGNOSIS — R079 Chest pain, unspecified: Secondary | ICD-10-CM

## 2011-05-20 HISTORY — PX: LEFT HEART CATHETERIZATION WITH CORONARY ANGIOGRAM: SHX5451

## 2011-05-20 LAB — CBC
HCT: 34.8 % — ABNORMAL LOW (ref 36.0–46.0)
Hemoglobin: 12.3 g/dL (ref 12.0–15.0)
MCH: 34 pg (ref 26.0–34.0)
MCHC: 35.3 g/dL (ref 30.0–36.0)
MCV: 96.1 fL (ref 78.0–100.0)

## 2011-05-20 LAB — COMPREHENSIVE METABOLIC PANEL
ALT: 11 U/L (ref 0–35)
AST: 17 U/L (ref 0–37)
Albumin: 3.2 g/dL — ABNORMAL LOW (ref 3.5–5.2)
Calcium: 9.1 mg/dL (ref 8.4–10.5)
Sodium: 135 mEq/L (ref 135–145)
Total Protein: 6.6 g/dL (ref 6.0–8.3)

## 2011-05-20 LAB — CARDIAC PANEL(CRET KIN+CKTOT+MB+TROPI)
CK, MB: 1.3 ng/mL (ref 0.3–4.0)
Relative Index: INVALID (ref 0.0–2.5)

## 2011-05-20 LAB — PROTIME-INR
INR: 1 (ref 0.00–1.49)
Prothrombin Time: 13.4 seconds (ref 11.6–15.2)

## 2011-05-20 SURGERY — LEFT HEART CATHETERIZATION WITH CORONARY ANGIOGRAM
Anesthesia: LOCAL

## 2011-05-20 MED ORDER — LIDOCAINE HCL (PF) 1 % IJ SOLN
INTRAMUSCULAR | Status: AC
  Filled 2011-05-20: qty 30

## 2011-05-20 MED ORDER — MIDAZOLAM HCL 2 MG/2ML IJ SOLN
INTRAMUSCULAR | Status: AC
  Filled 2011-05-20: qty 2

## 2011-05-20 MED ORDER — ACETAMINOPHEN 325 MG PO TABS: 650.0000 mg | ORAL_TABLET | ORAL | Status: DC | PRN

## 2011-05-20 MED ORDER — HEPARIN (PORCINE) IN NACL 2-0.9 UNIT/ML-% IJ SOLN
INTRAMUSCULAR | Status: AC
  Filled 2011-05-20: qty 2000

## 2011-05-20 MED ORDER — ASPIRIN EC 325 MG PO TBEC: 325.0000 mg | DELAYED_RELEASE_TABLET | Freq: Every day | ORAL | Status: DC

## 2011-05-20 MED ORDER — SODIUM CHLORIDE 0.45 % IV SOLN
INTRAVENOUS | Status: DC
  Administered 2011-05-20: 09:00:00 via INTRAVENOUS

## 2011-05-20 MED ORDER — ONDANSETRON HCL 4 MG/2ML IJ SOLN: 4.0000 mg | Freq: Four times a day (QID) | INTRAMUSCULAR | Status: DC | PRN

## 2011-05-20 MED ORDER — NITROGLYCERIN 0.2 MG/ML ON CALL CATH LAB
INTRAVENOUS | Status: AC
  Filled 2011-05-20: qty 1

## 2011-05-20 NOTE — Interval H&P Note (Signed)
History and Physical Interval Note:  05/20/2011 7:31 AM  Anna Lyons  has presented today for surgery, with the diagnosis of cp  The various methods of treatment have been discussed with the patient and family. After consideration of risks, benefits and other options for treatment, the patient has consented to  Procedure(s) (LRB): LEFT HEART CATHETERIZATION WITH CORONARY ANGIOGRAM (N/A) as a surgical intervention .  The patients' history has been reviewed, patient examined, no change in status, stable for surgery.  I have reviewed the patients' chart and labs.  Questions were answered to the patient's satisfaction.     Charlton Haws

## 2011-05-20 NOTE — CV Procedure (Signed)
      Catheterization   Indication: Chest pain  Procedure: After informed consent and clinical "time out" the right groin was prepped and draped in a sterile fashion.  A 5Fr sheath was placed in the right femoral artery using seldinger technique and local lidocaine.  Standard JL4, JR4 and angled pigtail catheters were used to engage the coronary arteries.  Coronary arteries were visualized in orthogonal views using caudal and cranial angulation.  RAO ventriculography was done using 25* cc of contrast.    Medications:   Versed: 2 mg's  Fentanyl: 0 ug's  Coronary Arteries: Right dominant with no anomalies  LM: Normal short segment really side by side ostium  LAD: normal    D1: Normal  Circumflex: Left dominant and normal   OM1: normal small  OM2: normal small  OM3: normal small  Left sided PDA/PLB normal  RCA: nondominant and normal    Ventriculography: EF: 65 %, no RWMA;s  Hemodynamics:  Aortic Pressure: 116/67 mmHg  LV Pressure: 119/4  mmHg  Impression:  No significant CAD.  Tolerated procedure well.  Plan per primary care  Charlton Haws 05/20/2011 8:26 AM

## 2011-05-20 NOTE — Progress Notes (Signed)
Patient admitted with syncope. Has hx of stroke Will order head CT to evaluate the syncope  Anna Lyons

## 2011-05-20 NOTE — Discharge Instructions (Addendum)
Keep cath site clean  Showers only for 1 week no Baths No driving for 2 weeks No lifting for 1 week

## 2011-05-20 NOTE — Discharge Summary (Signed)
Patient ID: Anna Lyons MRN: [id number] DOB/AGE: Sep 27, 1955 56 y.o. Primary Care Physician:ELKINS,WILSON OLIVER, MD, MD Admit date: 05/19/2011 Discharge date: 05/20/2011    Discharge Diagnoses:  Chest pain - cardiac cath WNL, probable chest wall pain - chostocondritis Nearsyncope  Depression  History of stroke  Essential hypertension, benign  Tobacco abuse   Medication List  As of 05/23/2011  7:39 AM   CONTINUE taking these medications         acetaminophen 650 MG CR tablet   Commonly known as: TYLENOL      aspirin EC 325 MG tablet      hydrochlorothiazide 50 MG tablet   Commonly known as: HYDRODIURIL      pantoprazole 40 MG tablet   Commonly known as: PROTONIX      rOPINIRole 0.5 MG tablet   Commonly known as: REQUIP      senna-docusate 8.6-50 MG per tablet   Commonly known as: Senokot-S      sertraline 50 MG tablet   Commonly known as: ZOLOFT      traMADol 50 MG tablet   Commonly known as: ULTRAM         STOP taking these medications         potassium chloride 10 MEQ tablet            Discharged Condition: good    Consults: [facility]   Significant Diagnostic Studies: Dg Chest 2 View  05/19/2011  *RADIOLOGY REPORT*  Clinical Data: Chest pain.  CHEST - 2 VIEW  Comparison: 04/20/2011  Findings: There is hyperinflation of the lungs compatible with COPD.  Heart and mediastinal contours are within normal limits.  No focal opacities or effusions.  No acute bony abnormality.  IMPRESSION: COPD.  No active disease.  Original Report Authenticated By: Cyndie Chime, M.D.   Dg Shoulder Right  04/26/2011  *RADIOLOGY REPORT*  Clinical Data: Right anterior shoulder pain, right CVA  RIGHT SHOULDER - 2+ VIEW  Comparison: The chest radiograph 08/1958 1012  Findings: There is no fracture or dislocation of the right shoulder.  There is mild degenerative change of the acromioclavicular joint.  IMPRESSION: 1. No acute findings  of the right shoulder.  2.  Mild  degenerative change at the acromioclavicular joint.  Original Report Authenticated By: Genevive Bi, M.D.   Ct Head Wo Contrast  05/20/2011  *RADIOLOGY REPORT*  Clinical Data:  Stroke approximately 1 month ago, now with dizziness and weakness.  CT HEAD WITHOUT CONTRAST  Technique: Contiguous axial images were obtained from the base of the skull through the vertex without intravenous contrast.  Comparison:  MRI 04/21/2011  Findings:  Evolving hypodensity left basal ganglia and periventricular white matter.  Cross-section measurements are approximately 9 x 17 mm on image 14 of series 2.  The infarct does not appear significantly larger than priors, when technique differences are considered.  No superimposed hemorrhage.  No new infarction.  Large perivascular space right basal ganglia.  Mild atrophy and mild chronic microvascular ischemic change.  Calvarium intact.  Clear sinuses and mastoids.  IMPRESSION: Findings consistent with typical evolution of a subacute to early chronic left basal ganglia and periventricular white matter infarct.  No new infarct, intervening hemorrhage, or other significant change from prior most recent MR.  Original Report Authenticated By: Elsie Stain, M.D.   Lab Results:  Results for PAIZLIE, KLAUS (MRN [id number]) as of 05/23/2011 07:40  Ref. Range 05/20/2011 09:42  Sodium Latest Range: 135-145 mEq/L 135  Potassium Latest Range:  3.5-5.1 mEq/L 4.1  Chloride Latest Range: 96-112 mEq/L 99  CO2 Latest Range: 19-32 mEq/L 28  BUN Latest Range: 6-23 mg/dL 17  Creat Latest Range: 0.50-1.10 mg/dL 9.14  Calcium Latest Range: 8.4-10.5 mg/dL 9.1  GFR calc non Af Amer Latest Range: >90 mL/min >90  GFR calc Af Amer Latest Range: >90 mL/min >90  Glucose Latest Range: 70-99 mg/dL 91  Alkaline Phosphatase Latest Range: 39-117 U/L 66  Albumin Latest Range: 3.5-5.2 g/dL 3.2 (L)  AST Latest Range: 0-37 U/L 17  ALT Latest Range: 0-35 U/L 11  Total Protein Latest Range: 6.0-8.3 g/dL 6.6   Total Bilirubin Latest Range: 0.3-1.2 mg/dL 0.6  CK, MB Latest Range: 0.3-4.0 ng/mL 1.3  CK Total Latest Range: 7-177 U/L 40  Troponin I Latest Range: <0.30 ng/mL <0.30  WBC Latest Range: 4.0-10.5 K/uL 3.5 (L)  RBC Latest Range: 3.87-5.11 MIL/uL 3.62 (L)  HGB Latest Range: 12.0-15.0 g/dL 78.2  HCT Latest Range: 36.0-46.0 % 34.8 (L)  MCV Latest Range: 78.0-100.0 fL 96.1  MCH Latest Range: 26.0-34.0 pg 34.0  MCHC Latest Range: 30.0-36.0 g/dL 95.6  RDW Latest Range: 11.5-15.5 % 12.2  Platelets Latest Range: 150-400 K/uL 137 (L)  Prothrombin Time Latest Range: 11.6-15.2 seconds 13.4  INR Latest Range: 0.00-1.49  1.00    Catheterization  Indication: Chest pain  Procedure: After informed consent and clinical "time out" the right groin was prepped and draped in a sterile fashion. A 5Fr sheath was placed in the right femoral artery using seldinger technique and local lidocaine. Standard JL4, JR4 and angled pigtail catheters were used to engage the coronary arteries. Coronary arteries were visualized in orthogonal views using caudal and cranial angulation. RAO ventriculography was done using 25* cc of contrast.  Medications:  Versed: 2 mg's Fentanyl: 0 ug's  Coronary Arteries:  Right dominant with no anomalies  LM: Normal short segment really side by side ostium   LAD: normal  D1: Normal  Circumflex: Left dominant and normal  OM1: normal small  OM2: normal small  OM3: normal small  Left sided PDA/PLB normal  RCA: nondominant and normal  Ventriculography: EF: 65 %, no RWMA;s  Hemodynamics:  Aortic Pressure: 116/67 mmHg LV Pressure: 119/4 mmHg  Impression: No significant CAD. Tolerated procedure well. Plan per primary care      Hospital Course:  56 yo woman with recent CVA, was admitted on 05/19/11 from the ED after an episode of chest pain and near syncope. Initial workup was negative for MI, stroke. She was placed on telemetry overnight and remained chest pain free and without  arrythmia. On 05/20/11 she underwent cardiac catheterization by Dr. Eden Emms.  No significant CAD was found. Patient was mobilized without any symptoms. She was discharged with instructions not to drive and to f/u with outpatient PT and OT Most likely etiology of chest pain is musculo skeletal and the near syncope was probably a vagal event.  Patient was reassured accordingly.   Discharge Exam: Blood pressure 120/74, pulse 59, temperature 97.9 F (36.6 C), temperature source Oral, resp. rate 18, height 5\' 5"  (1.651 m), weight 66.2 kg (145 lb 15.1 oz), SpO2 99.00%. Alert and oriented x3 CVS: RRR RS: CTAB Abdomen : soft, NT   Disposition: home  Discharge Orders    Future Appointments: Provider: Department: Dept Phone: Center:   05/25/2011 9:00 AM Hortencia Conradi, PTA [facility] [phone] [facility]   05/25/2011 9:45 AM Colonel Bald, OT [facility] [phone] [facility]   05/27/2011 1:00 PM Colonel Bald, OT  [facility] [phone] [facility]   05/27/2011 1:45 PM Hortencia Conradi, PTA [facility] [phone] [facility]   05/28/2011 8:30 AM [facility] [phone] None   05/28/2011 9:30 AM Erick Colace, MD [facility] [phone] None   05/31/2011 9:45 AM Timoteo Gaul, PT [facility] [phone] [facility]   05/31/2011 10:30 AM Trixie Deis [place] [facility] [phone] [facility]     Future Orders Please Complete By Expires   Diet - low sodium heart healthy      Nursing communication      Scheduling Instructions:   Patient may resume outpatient PT   Increase activity slowly         Follow-up Information    Schedule an appointment as soon as possible for a visit with Kaleen Mask, MD.   Contact information:   [address] [place] [address] [phone]          Signed: Lonia Blood 05/23/2011, 7:39 AM

## 2011-05-20 NOTE — Consult Note (Signed)
Pt was a 1 ppd smoker who quit last month cold [place]. Congratulated and encouraged pt to remain tobacco free. Discussed relapse prevention strategies and Referred to 1-800 quit now for f/u and support. Discussed oral fixation substitutes, second hand smoke and in home smoking policy. Reviewed and gave pt Written education/contact information.

## 2011-05-20 NOTE — Progress Notes (Signed)
Utilization Review Completed.Anna Lyons T4/18/2013   

## 2011-05-25 ENCOUNTER — Ambulatory Visit: Payer: Self-pay | Admitting: Physical Therapy

## 2011-05-25 ENCOUNTER — Ambulatory Visit: Payer: Self-pay | Admitting: Occupational Therapy

## 2011-05-27 ENCOUNTER — Ambulatory Visit: Payer: Self-pay | Admitting: Occupational Therapy

## 2011-05-27 ENCOUNTER — Ambulatory Visit: Payer: Self-pay | Admitting: Physical Therapy

## 2011-05-27 ENCOUNTER — Ambulatory Visit: Payer: Managed Care, Other (non HMO) | Admitting: Physical Therapy

## 2011-05-27 ENCOUNTER — Encounter: Payer: Managed Care, Other (non HMO) | Admitting: Occupational Therapy

## 2011-05-28 ENCOUNTER — Ambulatory Visit (HOSPITAL_BASED_OUTPATIENT_CLINIC_OR_DEPARTMENT_OTHER): Payer: Self-pay | Admitting: Physical Medicine & Rehabilitation

## 2011-05-28 ENCOUNTER — Encounter: Payer: Self-pay | Admitting: Physical Medicine & Rehabilitation

## 2011-05-28 ENCOUNTER — Encounter: Payer: Self-pay | Attending: Physical Medicine & Rehabilitation

## 2011-05-28 DIAGNOSIS — R269 Unspecified abnormalities of gait and mobility: Secondary | ICD-10-CM

## 2011-05-28 DIAGNOSIS — R5381 Other malaise: Secondary | ICD-10-CM | POA: Insufficient documentation

## 2011-05-28 DIAGNOSIS — I6350 Cerebral infarction due to unspecified occlusion or stenosis of unspecified cerebral artery: Secondary | ICD-10-CM

## 2011-05-28 DIAGNOSIS — R279 Unspecified lack of coordination: Secondary | ICD-10-CM | POA: Insufficient documentation

## 2011-05-28 DIAGNOSIS — R5383 Other fatigue: Secondary | ICD-10-CM | POA: Insufficient documentation

## 2011-05-28 DIAGNOSIS — I639 Cerebral infarction, unspecified: Secondary | ICD-10-CM

## 2011-05-28 DIAGNOSIS — I69998 Other sequelae following unspecified cerebrovascular disease: Secondary | ICD-10-CM | POA: Insufficient documentation

## 2011-05-28 NOTE — Patient Instructions (Addendum)
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

## 2011-05-28 NOTE — Progress Notes (Signed)
  Subjective:    Patient ID: Anna Lyons, female    DOB: 05-20-1955, 56 y.o.   MRN: [id number]  HPI HISTORY OF PRESENT ILLNESS: This is a 56 year old right-handed female  with history of transient ischemic attack, July 2012, who was admitted  March 19 with right-sided weakness and slurred speech. MRI showed acute  nonhemorrhagic infarction, involving the posterior left lentiform  nucleus as well as remote lacunar infarction in the basal ganglia  bilaterally. Carotid Dopplers with no internal carotid artery stenosis.  Echocardiogram with ejection fraction of 60% without wall motion  abnormalities. Negative for emboli. Cerebral angiogram with no  occlusion or dissection noted. The patient did receive tPA   patient is dressing herself bathing herself but needs her husband to scrub  her back   Patient is going outpatient therapy however PT maybe Pain Inventory Average Pain 0 Pain Right Now 0 My pain is n/a  In the last 24 hours, has pain interfered with the following? General activity 0 Relation with others 0 Enjoyment of life 0 What TIME of day is your pain at its worst? feeling good Sleep (in general) Fair  Pain is worse with: n/a Pain improves with: n/a Relief from Meds: n/a  Mobility walk without assistance ability to climb steps?  yes do you drive?  yes  Function employed # of hrs/week 40 hours 9-5  Neuro/Psych No problems in this area  Prior Studies Any changes since last visit?  no  Physicians involved in your care Any changes since last visit?  no       Review of Systems  All other systems reviewed and are negative.       Objective:   Physical Exam  Constitutional: She is oriented to person, place, and time. She appears well-developed and well-nourished.  Musculoskeletal: Normal range of motion.  Neurological: She is alert and oriented to person, place, and time. She has normal strength. Gait abnormal.  Reflex Scores:      Tricep reflexes are 3+  on the right side and 3+ on the left side.      Bicep reflexes are 3+ on the right side and 3+ on the left side.      Brachioradialis reflexes are 3+ on the right side and 3+ on the left side.      Patellar reflexes are 3+ on the right side and 3+ on the left side.      Achilles reflexes are 3+ on the right side and 3+ on the left side.      reduced tandem gait  Psychiatric: She has a normal mood and affect. Her behavior is normal. Judgment and thought content normal.       Assessment & Plan:  1.   Left subcortical infarct with residual mild balance deficits. Her weaknesses not apparent at this point to manual muscle testing. There are no significant sensory deficits. I do think she can go back to part-time work at this time. 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 She'll continue her physical therapy while she is working part-time for the next month. If she is doing well when I see her next month we can advance to full-time. She has return to driving and is doing well with this as well

## 2011-05-31 ENCOUNTER — Ambulatory Visit: Payer: Self-pay | Admitting: Occupational Therapy

## 2011-05-31 ENCOUNTER — Ambulatory Visit: Payer: Self-pay | Admitting: Physical Therapy

## 2011-06-03 ENCOUNTER — Ambulatory Visit: Payer: Self-pay | Attending: Physical Medicine & Rehabilitation | Admitting: Occupational Therapy

## 2011-06-03 DIAGNOSIS — M6281 Muscle weakness (generalized): Secondary | ICD-10-CM | POA: Insufficient documentation

## 2011-06-03 DIAGNOSIS — R279 Unspecified lack of coordination: Secondary | ICD-10-CM | POA: Insufficient documentation

## 2011-06-03 DIAGNOSIS — I69998 Other sequelae following unspecified cerebrovascular disease: Secondary | ICD-10-CM | POA: Insufficient documentation

## 2011-06-03 DIAGNOSIS — R269 Unspecified abnormalities of gait and mobility: Secondary | ICD-10-CM | POA: Insufficient documentation

## 2011-06-03 DIAGNOSIS — Z5189 Encounter for other specified aftercare: Secondary | ICD-10-CM | POA: Insufficient documentation

## 2011-06-08 ENCOUNTER — Ambulatory Visit: Payer: Self-pay | Admitting: Occupational Therapy

## 2011-06-10 ENCOUNTER — Ambulatory Visit: Payer: Self-pay | Admitting: Occupational Therapy

## 2011-06-15 ENCOUNTER — Ambulatory Visit: Payer: Self-pay | Admitting: Occupational Therapy

## 2011-06-17 ENCOUNTER — Ambulatory Visit: Payer: Self-pay | Admitting: Occupational Therapy

## 2011-06-22 ENCOUNTER — Ambulatory Visit: Payer: Self-pay | Admitting: Occupational Therapy

## 2011-06-24 ENCOUNTER — Ambulatory Visit: Payer: Self-pay | Admitting: Occupational Therapy

## 2011-06-25 ENCOUNTER — Encounter: Payer: Self-pay | Admitting: Physical Medicine & Rehabilitation

## 2011-06-25 ENCOUNTER — Ambulatory Visit (HOSPITAL_BASED_OUTPATIENT_CLINIC_OR_DEPARTMENT_OTHER): Payer: Self-pay | Admitting: Physical Medicine & Rehabilitation

## 2011-06-25 ENCOUNTER — Encounter: Payer: Self-pay | Attending: Physical Medicine & Rehabilitation

## 2011-06-25 VITALS — BP 130/82 | HR 66 | Ht 66.0 in | Wt 146.0 lb

## 2011-06-25 DIAGNOSIS — G8190 Hemiplegia, unspecified affecting unspecified side: Secondary | ICD-10-CM

## 2011-06-25 DIAGNOSIS — I6350 Cerebral infarction due to unspecified occlusion or stenosis of unspecified cerebral artery: Secondary | ICD-10-CM

## 2011-06-25 DIAGNOSIS — I639 Cerebral infarction, unspecified: Secondary | ICD-10-CM

## 2011-06-25 DIAGNOSIS — R279 Unspecified lack of coordination: Secondary | ICD-10-CM | POA: Insufficient documentation

## 2011-06-25 DIAGNOSIS — R5383 Other fatigue: Secondary | ICD-10-CM | POA: Insufficient documentation

## 2011-06-25 DIAGNOSIS — G8191 Hemiplegia, unspecified affecting right dominant side: Secondary | ICD-10-CM

## 2011-06-25 DIAGNOSIS — R5381 Other malaise: Secondary | ICD-10-CM | POA: Insufficient documentation

## 2011-06-25 DIAGNOSIS — I69998 Other sequelae following unspecified cerebrovascular disease: Secondary | ICD-10-CM | POA: Insufficient documentation

## 2011-06-25 NOTE — Progress Notes (Signed)
Subjective:    Patient ID: Anna Lyons, female    DOB: 04-04-1955, 56 y.o.   MRN: [id number]  HPI Fall  risk reduced from moderate to low.  PT/OT discontinued yesterday. Home exercises. Handwriting still a problem. Back to work part-time. Back to driving no issues. Pain Inventory Average Pain 0 Pain Right Now 0 My pain is intermittent  In the last 24 hours, has pain interfered with the following? General activity 0 Relation with others 0 Enjoyment of life 0 What TIME of day is your pain at its worst? n/a Sleep (in general) Fair  Pain is worse with: n/a Pain improves with: n/a Relief from Meds: 10  Mobility walk without assistance how many minutes can you walk? 10 min ability to climb steps?  yes do you drive?  yes Do you have any goals in this area?  yes  Function employed # of hrs/week 25 hrs Do you have any goals in this area?  yes  Neuro/Psych No problems in this area  Prior Studies Any changes since last visit?  no  Physicians involved in your care Any changes since last visit?  no   Family History  Problem Relation Age of Onset  . Lupus Mother     died early 1's.  . Emphysema Father     died late 37's.  [name] COPD Father    History   Social History  . Marital Status: Married    Spouse Name: N/A    Number of Children: N/A  . Years of Education: N/A   Social History Main Topics  . Smoking status: Former Smoker -- 0.5 packs/day for 37 years    Types: Cigarettes    Quit date: 04/20/2011  . Smokeless tobacco: Never Used   Comment: quit 04/20/2011.  [name] Alcohol Use: No  . Drug Use: No  . Sexually Active: No   Other Topics Concern  . None   Social History Narrative   Lives @ home locally with husband.   Past Surgical History  Procedure Date  . Mastectomy 1980's    bilateral with reconstruction  . Breast reconstruction     bilaterally  . Cesarean section 1979  . Knee arthroscopy 1990's    left; cartilage repair  . Carpal tunnel release  1990's    left   Past Medical History  Diagnosis Date  . Depression   . Hypertension   . Angina   . Restless leg syndrome   . TIA (transient ischemic attack) 08/2010  . Stroke 04/20/2011    a. 04/20/11 Left subcortical infarct treated w/ TPA;  b. 04/2011 Echo: EF 55-60%;  c. 04/2011 Normal Carotid u/s  d. Residual "walk w/limp on right; unable to grasp w/right hand"  . Tobacco abuse     a. quit @ time of CVA 04/2011.   There were no vitals taken for this visit.     Review of Systems  All other systems reviewed and are negative.       Objective:   Physical Exam  Constitutional: She is oriented to person, place, and time. She appears well-developed and well-nourished.  Neurological: She is alert and oriented to person, place, and time. No sensory deficit. Coordination abnormal. Gait normal.       Motor strength is 4/5 in the right deltoid, biceps, triceps, grip 5/5 in all remaining muscle groups 3 spine motor coordination in the right hand  Psychiatric: She has a normal mood and affect.  I'll see her back on as-needed basis Assessment & Plan:  1. Left subcortical CVA right hemiparesis  Improved function. Back to work. No further therapy needed. Continue home exercise program.

## 2011-06-25 NOTE — Patient Instructions (Signed)
May resume full time work

## 2011-06-29 ENCOUNTER — Other Ambulatory Visit: Payer: Self-pay | Admitting: *Deleted

## 2011-06-29 ENCOUNTER — Encounter: Payer: Managed Care, Other (non HMO) | Admitting: Occupational Therapy

## 2011-06-29 MED ORDER — POTASSIUM CHLORIDE ER 10 MEQ PO TBCR: 10.0000 meq | EXTENDED_RELEASE_TABLET | Freq: Two times a day (BID) | ORAL | Status: DC

## 2011-07-01 ENCOUNTER — Encounter: Payer: Managed Care, Other (non HMO) | Admitting: Occupational Therapy

## 2011-11-16 ENCOUNTER — Observation Stay (HOSPITAL_COMMUNITY)
Admission: EM | Admit: 2011-11-16 | Discharge: 2011-11-17 | Disposition: A | Discharge status: Home / Self Care | Payer: Self-pay | Attending: Internal Medicine | Admitting: Internal Medicine

## 2011-11-16 ENCOUNTER — Emergency Department (HOSPITAL_COMMUNITY): Payer: Self-pay

## 2011-11-16 ENCOUNTER — Encounter (HOSPITAL_COMMUNITY): Payer: Self-pay | Admitting: Internal Medicine

## 2011-11-16 ENCOUNTER — Observation Stay (HOSPITAL_COMMUNITY): Payer: Self-pay

## 2011-11-16 DIAGNOSIS — Z7982 Long term (current) use of aspirin: Secondary | ICD-10-CM | POA: Insufficient documentation

## 2011-11-16 DIAGNOSIS — F3289 Other specified depressive episodes: Secondary | ICD-10-CM | POA: Insufficient documentation

## 2011-11-16 DIAGNOSIS — F329 Major depressive disorder, single episode, unspecified: Secondary | ICD-10-CM

## 2011-11-16 DIAGNOSIS — Z23 Encounter for immunization: Secondary | ICD-10-CM | POA: Insufficient documentation

## 2011-11-16 DIAGNOSIS — I200 Unstable angina: Secondary | ICD-10-CM

## 2011-11-16 DIAGNOSIS — G459 Transient cerebral ischemic attack, unspecified: Principal | ICD-10-CM

## 2011-11-16 DIAGNOSIS — E876 Hypokalemia: Secondary | ICD-10-CM

## 2011-11-16 DIAGNOSIS — G8191 Hemiplegia, unspecified affecting right dominant side: Secondary | ICD-10-CM

## 2011-11-16 DIAGNOSIS — R9431 Abnormal electrocardiogram [ECG] [EKG]: Secondary | ICD-10-CM

## 2011-11-16 DIAGNOSIS — F339 Major depressive disorder, recurrent, unspecified: Secondary | ICD-10-CM | POA: Diagnosis present

## 2011-11-16 DIAGNOSIS — Z720 Tobacco use: Secondary | ICD-10-CM

## 2011-11-16 DIAGNOSIS — R5381 Other malaise: Secondary | ICD-10-CM | POA: Insufficient documentation

## 2011-11-16 DIAGNOSIS — I69998 Other sequelae following unspecified cerebrovascular disease: Secondary | ICD-10-CM | POA: Insufficient documentation

## 2011-11-16 DIAGNOSIS — I639 Cerebral infarction, unspecified: Secondary | ICD-10-CM

## 2011-11-16 DIAGNOSIS — D649 Anemia, unspecified: Secondary | ICD-10-CM

## 2011-11-16 DIAGNOSIS — Z8673 Personal history of transient ischemic attack (TIA), and cerebral infarction without residual deficits: Secondary | ICD-10-CM

## 2011-11-16 DIAGNOSIS — F32A Depression, unspecified: Secondary | ICD-10-CM

## 2011-11-16 DIAGNOSIS — I10 Essential (primary) hypertension: Secondary | ICD-10-CM

## 2011-11-16 DIAGNOSIS — G2581 Restless legs syndrome: Secondary | ICD-10-CM

## 2011-11-16 DIAGNOSIS — R29810 Facial weakness: Secondary | ICD-10-CM | POA: Insufficient documentation

## 2011-11-16 DIAGNOSIS — Z79899 Other long term (current) drug therapy: Secondary | ICD-10-CM | POA: Insufficient documentation

## 2011-11-16 DIAGNOSIS — F331 Major depressive disorder, recurrent, moderate: Secondary | ICD-10-CM | POA: Diagnosis present

## 2011-11-16 DIAGNOSIS — G8190 Hemiplegia, unspecified affecting unspecified side: Secondary | ICD-10-CM

## 2011-11-16 HISTORY — DX: Restless legs syndrome: G25.81

## 2011-11-16 LAB — COMPREHENSIVE METABOLIC PANEL
ALT: 15 U/L (ref 0–35)
AST: 27 U/L (ref 0–37)
Albumin: 3.6 g/dL (ref 3.5–5.2)
CO2: 29 mEq/L (ref 19–32)
Calcium: 9.5 mg/dL (ref 8.4–10.5)
Chloride: 98 mEq/L (ref 96–112)
Creatinine, Ser: 0.75 mg/dL (ref 0.50–1.10)
Creatinine, Ser: 0.77 mg/dL (ref 0.50–1.10)
GFR calc Af Amer: >90 mL/min (ref >90)
GFR calc non Af Amer: >90 mL/min (ref >90)
Glucose, Bld: 108 mg/dL — ABNORMAL HIGH (ref 70–99)
Potassium: 3.3 mEq/L — ABNORMAL LOW (ref 3.5–5.1)
Sodium: 136 mEq/L (ref 135–145)
Total Bilirubin: 0.4 mg/dL (ref 0.3–1.2)
Total Protein: 7.6 g/dL (ref 6.0–8.3)

## 2011-11-16 LAB — DIFFERENTIAL
Basophils Absolute: 0 10*3/uL (ref 0.0–0.1)
Basophils Relative: 1 % (ref 0–1)
Lymphocytes Relative: 23 % (ref 12–46)
Monocytes Absolute: 0.4 10*3/uL (ref 0.1–1.0)
Neutro Abs: 4 10*3/uL (ref 1.7–7.7)
Neutrophils Relative %: 68 % (ref 43–77)

## 2011-11-16 LAB — CBC WITH DIFFERENTIAL/PLATELET
Eosinophils Absolute: 0.1 10*3/uL (ref 0.0–0.7)
Eosinophils Relative: 2 % (ref 0–5)
HCT: 38.4 % (ref 36.0–46.0)
Lymphocytes Relative: 27 % (ref 12–46)
Lymphs Abs: 1.4 10*3/uL (ref 0.7–4.0)
MCH: 34.7 pg — ABNORMAL HIGH (ref 26.0–34.0)
MCV: 95.8 fL (ref 78.0–100.0)
Monocytes Absolute: 0.3 10*3/uL (ref 0.1–1.0)
Platelets: 183 10*3/uL (ref 150–400)
RBC: 4.01 MIL/uL (ref 3.87–5.11)
RDW: 11.9 % (ref 11.5–15.5)
WBC: 5 10*3/uL (ref 4.0–10.5)

## 2011-11-16 LAB — CBC
MCHC: 35.3 g/dL (ref 30.0–36.0)
Platelets: 166 10*3/uL (ref 150–400)
RDW: 11.8 % (ref 11.5–15.5)
WBC: 5.9 10*3/uL (ref 4.0–10.5)

## 2011-11-16 LAB — POCT I-STAT TROPONIN I: Troponin i, poc: 0 ng/mL (ref 0.00–0.08)

## 2011-11-16 LAB — GLUCOSE, CAPILLARY: Glucose-Capillary: 85 mg/dL (ref 70–99)

## 2011-11-16 LAB — POCT I-STAT, CHEM 8
BUN: 10 mg/dL (ref 6–23)
Chloride: 97 mEq/L (ref 96–112)
Creatinine, Ser: 0.9 mg/dL (ref 0.50–1.10)
Potassium: 3.3 mEq/L — ABNORMAL LOW (ref 3.5–5.1)
Sodium: 135 mEq/L (ref 135–145)
TCO2: 28 mmol/L (ref 0–100)

## 2011-11-16 LAB — PROTIME-INR
INR: 1.01 (ref 0.00–1.49)
Prothrombin Time: 13.2 seconds (ref 11.6–15.2)

## 2011-11-16 LAB — APTT: aPTT: 30 seconds (ref 24–37)

## 2011-11-16 MED ORDER — POTASSIUM CHLORIDE CRYS ER 20 MEQ PO TBCR
40.0000 meq | EXTENDED_RELEASE_TABLET | Freq: Once | ORAL | Status: AC
  Administered 2011-11-16: 40 meq via ORAL
  Filled 2011-11-16: qty 2

## 2011-11-16 MED ORDER — ROPINIROLE HCL 0.5 MG PO TABS
0.5000 mg | ORAL_TABLET | Freq: Every day | ORAL | Status: DC
  Administered 2011-11-16: 0.5 mg via ORAL
  Filled 2011-11-16 (×2): qty 1

## 2011-11-16 MED ORDER — STUDY - INVESTIGATIONAL DRUG SIMPLE RECORD
600.0000 mg | Status: AC
  Administered 2011-11-16: 600 mg via ORAL
  Filled 2011-11-16: qty 600

## 2011-11-16 MED ORDER — INFLUENZA VIRUS VACC SPLIT PF IM SUSP
0.5000 mL | INTRAMUSCULAR | Status: AC
  Administered 2011-11-17: 0.5 mL via INTRAMUSCULAR
  Filled 2011-11-16: qty 0.5

## 2011-11-16 MED ORDER — SODIUM CHLORIDE 0.9 % IV SOLN
INTRAVENOUS | Status: DC
  Administered 2011-11-16: 22:00:00 via INTRAVENOUS

## 2011-11-16 MED ORDER — SERTRALINE HCL 50 MG PO TABS
50.0000 mg | ORAL_TABLET | Freq: Every day | ORAL | Status: DC
  Administered 2011-11-17: 50 mg via ORAL
  Filled 2011-11-16: qty 1

## 2011-11-16 MED ORDER — HYDROCHLOROTHIAZIDE 25 MG PO TABS
25.0000 mg | ORAL_TABLET | Freq: Every day | ORAL | Status: DC
  Administered 2011-11-17: 25 mg via ORAL
  Filled 2011-11-16: qty 1

## 2011-11-16 MED ORDER — SENNOSIDES-DOCUSATE SODIUM 8.6-50 MG PO TABS: 1.0000 | ORAL_TABLET | Freq: Every evening | ORAL | Status: DC | PRN

## 2011-11-16 MED ORDER — CLOPIDOGREL BISULFATE 75 MG PO TABS: 75.0000 mg | ORAL_TABLET | Freq: Every day | ORAL | Status: DC

## 2011-11-16 MED ORDER — ACETAMINOPHEN 650 MG RE SUPP: 650.0000 mg | RECTAL | Status: DC | PRN

## 2011-11-16 MED ORDER — PANTOPRAZOLE SODIUM 40 MG PO TBEC
40.0000 mg | DELAYED_RELEASE_TABLET | Freq: Every day | ORAL | Status: DC
  Administered 2011-11-17: 40 mg via ORAL
  Filled 2011-11-16: qty 1

## 2011-11-16 MED ORDER — ONDANSETRON HCL 4 MG/2ML IJ SOLN: 4.0000 mg | Freq: Four times a day (QID) | INTRAMUSCULAR | Status: DC | PRN

## 2011-11-16 MED ORDER — TRAMADOL HCL 50 MG PO TABS
50.0000 mg | ORAL_TABLET | Freq: Every day | ORAL | Status: DC
  Administered 2011-11-17: 50 mg via ORAL
  Filled 2011-11-16 (×2): qty 1

## 2011-11-16 MED ORDER — ACETAMINOPHEN 325 MG PO TABS
650.0000 mg | ORAL_TABLET | ORAL | Status: DC | PRN
  Administered 2011-11-17: 650 mg via ORAL
  Filled 2011-11-16: qty 2

## 2011-11-16 MED ORDER — ASPIRIN EC 325 MG PO TBEC
325.0000 mg | DELAYED_RELEASE_TABLET | Freq: Every day | ORAL | Status: DC
  Administered 2011-11-17: 325 mg via ORAL
  Filled 2011-11-16 (×2): qty 1

## 2011-11-16 MED ORDER — POTASSIUM CHLORIDE ER 10 MEQ PO TBCR
10.0000 meq | EXTENDED_RELEASE_TABLET | Freq: Two times a day (BID) | ORAL | Status: DC
  Administered 2011-11-16 – 2011-11-17 (×2): 10 meq via ORAL
  Filled 2011-11-16 (×3): qty 1

## 2011-11-16 MED ORDER — STUDY - INVESTIGATIONAL DRUG SIMPLE RECORD
75.0000 mg | Freq: Every day | Status: DC
  Administered 2011-11-17: 75 mg via ORAL
  Filled 2011-11-16: qty 75

## 2011-11-16 NOTE — ED Notes (Signed)
MD at bedside. Dr. Glick. 

## 2011-11-16 NOTE — ED Notes (Signed)
Patient transported to MRI 

## 2011-11-16 NOTE — ED Notes (Signed)
Pt ambulated to bathroom. Tolerated well.

## 2011-11-16 NOTE — Research (Signed)
Patient was evaluated in the ED for c/o left side facial droop that started at 11:00 this am. The stroke team identified patient as a potential POINT trial candidate. An informed consent form was given to patient to read and ask questions. Patient was given time to read the informed consent. No study related test or procedures were performed prior to the consenting process. Patient did not ask any questions when queried about the trial. Patient signed the informed consent and a copy was given to patient for personal record. Patient met the inclusion/exclusion criteria for trial enrollment. Patient was randomized to kit number [id number] and dispensed 8 tabs of study drug.

## 2011-11-16 NOTE — ED Provider Notes (Addendum)
56 year old female the history of stroke with mild right hemiparesis and noted onset 10:30 that she had a facial droop. She notices when she was brushing her teeth. At about 11 AM, she noticed that she fell at, on the right side of her body and. She has a mild residual weakness from her prior stroke and did not notice any new weakness. Denies headache or chest pain or nausea or vomiting. On exam, there is a very mild right central facial droop. Speech is minimally dysarthric and does seems consistent with the degree of facial droop. There is no pronator drift, but right arm strength is 4/5 and right leg strength is 4/5. Symptoms are rather vague, but could stroke is activated. Her prior records are reviewed and she had been admitted in March with a stroke for which he received thrombolytics and it was noted that she had residual right hemiparesthesias but I could not find any specific quantification of degree of weakness.  Dione Booze, MD 11/16/11 1247   Date: 11/16/2011  Rate: 75  Rhythm: normal sinus rhythm  QRS Axis: left  Intervals: normal  ST/T Wave abnormalities: normal  Conduction Disutrbances:Incomplete right bundle-branch block  Narrative Interpretation:  incomplete right bundle-branch block, left axis deviation. When compared with ECG of 05/20/2011, incomplete right bundle-branch block is new.   Old EKG Reviewed: changes noted  CRITICAL CARE Performed by: ZOXWR,UEAVW   Total critical care time: 35 minutes  Critical care time was exclusive of separately billable procedures and treating other patients.  Critical care was necessary to treat or prevent imminent or life-threatening deterioration.  Critical care was time spent personally by me on the following activities: development of treatment plan with patient and/or surrogate as well as nursing, discussions with consultants, evaluation of patient's response to treatment, examination of patient, obtaining history from patient or  surrogate, ordering and performing treatments and interventions, ordering and review of laboratory studies, ordering and review of radiographic studies, pulse oximetry and re-evaluation of patient's condition.   Medical screening examination/treatment/procedure(s) were conducted as a shared visit with non-physician practitioner(s) and myself.  I personally evaluated the patient during the encounter   Case is discussed with Dr. Thad Ranger of her [facility] who recommends obtaining MRI scan admitting patient under observation status to get serial neuro checks. Case is discussed Dr. Janee Morn of [facility] who agrees to admit the patient.  Dione Booze, MD 11/16/11 1410  Dione Booze, MD 11/16/11 [phone]

## 2011-11-16 NOTE — Consult Note (Signed)
[facility] STROKE CONSULT NOTE       Chief Complaint: right facial droop and code stroke   HPI:    Anna Lyons is an 56 y.o. female who was last seen in hospital for stroke on march of 2013. At that time she received tPA. Work up showed normal carotid dopplers, 2 D echo showed 55-60 % EF,negative CT angio of extracranial vessels, negative CT brain, and positive stroke in the left posterior lentiform nucleus seen on MRI. [name]  She was discharged on ASA 325 at that time. She was doing well until this am.  She who woke up at 8 AM in her normal state.  She ate breakfast at 10:30 and noted she had difficulty chewing her food.  She brushed her teeth at 11 am and noted a right facial droop. No other symptoms. Initial CT head showed no acute infarct or bleed. Facial droop was still present. During further exam her facial droop improved and she is now back to her baseline. Initial NIHSS of 2 for right facial droop and pronator drift. Code stroke was canceled due to rapidly resolving symptoms.  LSN: 10:30 tPA Given: No: rapid improvment    Past Medical History  Diagnosis Date  . Depression   . Hypertension   . Angina   . Restless leg syndrome   . TIA (transient ischemic attack) 08/2010  . Stroke 04/20/2011    a. 04/20/11 Left subcortical infarct treated w/ TPA;  b. 04/2011 Echo: EF 55-60%;  c. 04/2011 Normal Carotid u/s  d. Residual "walk w/limp on right; unable to grasp w/right hand"  . Tobacco abuse     a. quit @ time of CVA 04/2011.    Past Surgical History  Procedure Date  . Mastectomy 1980's    bilateral with reconstruction  . Breast reconstruction     bilaterally  . Cesarean section 1979  . Knee arthroscopy 1990's    left; cartilage repair  . Carpal tunnel release 1990's    left    Family History  Problem Relation Age of Onset  . Lupus Mother     died early 42's.  . Emphysema Father     died late 39's.  [name] COPD Father    Social History:  reports that she  quit smoking about 6 months ago. Her smoking use included Cigarettes. She has a 18.5 pack-year smoking history. She has never used smokeless tobacco. She reports that she does not drink alcohol or use illicit drugs.  Allergies:  Allergies  Allergen Reactions  . Codeine Itching    Medications:    No current facility-administered medications for this encounter.   Current Outpatient Prescriptions  Medication Sig Dispense Refill  . aspirin EC 325 MG tablet Take 325 mg by mouth daily.      . hydrochlorothiazide (HYDRODIURIL) 25 MG tablet Take 25 mg by mouth daily.      . pantoprazole (PROTONIX) 40 MG tablet Take 40 mg by mouth daily as needed. indigestion      . potassium chloride (K-DUR) 10 MEQ tablet Take 10 mEq by mouth 2 (two) times daily.      [name] rOPINIRole (REQUIP) 0.5 MG tablet Take 0.5 mg by mouth at bedtime.      . sertraline (ZOLOFT) 50 MG tablet Take 50 mg by mouth daily.      . traMADol (ULTRAM) 50 MG tablet Take 50 mg by mouth daily. For pain.      [name]  DISCONTD: potassium chloride (KLOR-CON 10) 10 MEQ tablet Take 1 tablet (10 mEq total) by mouth 2 (two) times daily.  30 tablet  0    ROS: History obtained from the patient  General ROS: negative for - chills, fatigue, fever, night sweats, weight gain or weight loss Psychological ROS: negative for - behavioral disorder, hallucinations, memory difficulties, mood swings or suicidal ideation Ophthalmic ROS: negative for - blurry vision, double vision, eye pain or loss of vision ENT ROS: negative for - epistaxis, nasal discharge, oral lesions, sore throat, tinnitus or vertigo Allergy and Immunology ROS: negative for - hives or itchy/watery eyes Hematological and Lymphatic ROS: negative for - bleeding problems, bruising or swollen lymph nodes Endocrine ROS: negative for - galactorrhea, hair pattern changes, polydipsia/polyuria or temperature intolerance Respiratory ROS: negative for - cough, hemoptysis, shortness of breath or  wheezing Cardiovascular ROS: negative for - chest pain, dyspnea on exertion, edema or irregular heartbeat Gastrointestinal ROS: negative for - abdominal pain, diarrhea, hematemesis, nausea/vomiting or stool incontinence Genito-Urinary ROS: negative for - dysuria, hematuria, incontinence or urinary frequency/urgency Musculoskeletal ROS: negative for - joint swelling or muscular weakness Neurological ROS: as noted in HPI Dermatological ROS: negative for rash and skin lesion changes   Physical Examination: Blood pressure 125/76, pulse 73, temperature 97.9 F (36.6 C), resp. rate 10, SpO2 96.00%.  Neurologic Examination:  Mental Status: Alert, oriented, thought content appropriate.  Speech fluent without evidence of aphasia.  Able to follow 3 step commands without difficulty. Cranial Nerves: II: Discs flat bilaterally; Visual fields grossly normal, pupils equal, round, reactive to light and accommodation III,IV, VI: ptosis not present, extra-ocular motions intact bilaterally V,VII: smile asymmetric on the right (showing less teeth) and decreased NL fold, facial light touch sensation normal bilaterally VIII: hearing normal bilaterally IX,X: gag reflex present XI: bilateral shoulder shrug XII: midline tongue extension Motor: Right : Upper extremity   5/5 (pronator drift)  Left:     Upper extremity   5/5  Lower extremity   5/5     Lower extremity   5/5 Tone and bulk:normal tone throughout; no atrophy noted Sensory: Pinprick and light touch intact throughout, bilaterally Deep Tendon Reflexes: 2+ brisk and symmetric throughout Plantars: Right: downgoing   Left: downgoing Cerebellar: normal finger-to-nose,  normal heel-to-shin test CV: pulses palpable throughout     Lab Results  Component Value Date/Time   CHOL 144 04/21/2011  4:00 AM    Results for orders placed during the hospital encounter of 11/16/11 (from the past 48 hour(s))  CBC WITH DIFFERENTIAL     Status: Abnormal    Collection Time   11/16/11 12:01 PM      Component Value Range Comment   WBC 5.0  4.0 - 10.5 K/uL    RBC 4.01  3.87 - 5.11 MIL/uL    Hemoglobin 13.9  12.0 - 15.0 g/dL    HCT 16.1  09.6 - 04.5 %    MCV 95.8  78.0 - 100.0 fL    MCH 34.7 (*) 26.0 - 34.0 pg    MCHC 36.2 (*) 30.0 - 36.0 g/dL    RDW 40.9  81.1 - 91.4 %    Platelets 183  150 - 400 K/uL    Neutrophils Relative 64  43 - 77 %    Neutro Abs 3.2  1.7 - 7.7 K/uL    Lymphocytes Relative 27  12 - 46 %    Lymphs Abs 1.4  0.7 - 4.0 K/uL    Monocytes Relative 7  3 - 12 %    Monocytes Absolute 0.3  0.1 - 1.0 K/uL    Eosinophils Relative 2  0 - 5 %    Eosinophils Absolute 0.1  0.0 - 0.7 K/uL    Basophils Relative 0  0 - 1 %    Basophils Absolute 0.0  0.0 - 0.1 K/uL   COMPREHENSIVE METABOLIC PANEL     Status: Abnormal   Collection Time   11/16/11 12:01 PM      Component Value Range Comment   Sodium 134 (*) 135 - 145 mEq/L    Potassium 3.2 (*) 3.5 - 5.1 mEq/L    Chloride 95 (*) 96 - 112 mEq/L    CO2 29  19 - 32 mEq/L    Glucose, Bld 108 (*) 70 - 99 mg/dL    BUN 9  6 - 23 mg/dL    Creatinine, Ser 4.13  0.50 - 1.10 mg/dL    Calcium 9.5  8.4 - 24.4 mg/dL    Total Protein 7.6  6.0 - 8.3 g/dL    Albumin 3.7  3.5 - 5.2 g/dL    AST 25  0 - 37 U/L    ALT 15  0 - 35 U/L    Alkaline Phosphatase 70  39 - 117 U/L    Total Bilirubin 0.5  0.3 - 1.2 mg/dL    GFR calc non Af Amer >90  >90 mL/min    GFR calc Af Amer >90  >90 mL/min     No results found.   EKG NRS  Assessment:    56 y.o. female presenting to [facility] ED with symptoms of right facial droop noted at 11:00 AM.  Code stroke was initiated. CT of head was negative for acute stroke or bleed and patients symptoms rapidly resolved to baseline (right NL fold decrease, and slight weakness right UE) while in emergency department. Code stroke was canceled due to rapid resolution of symptoms.  Previous CT angio head/neck, echo, A1c and FLP were WNL. Due to recent negative work-up would not  repeat work up at this time.  Episode may very well be a TIA though.  Further medical management recommended.     Stroke Risk Factors - hypertension  PLAN:  1. MRI of the brain without contrast 2. Change patient from ASA 325 to Plavix - dose 75 mg daily 3. Risk factor modification 4. Telemetry monitoring 5. Frequent neuro checks  Felicie Morn PA-C [facility] [phone]  11/16/2011, 1:14 PM    Patient seen and examined.  Clinical course and management discussed.  Necessary edits performed.  I agree with the above. Case discussed with Dr. Lovey Newcomer, MD [facility] [phone]  11/16/2011  2:32 PM

## 2011-11-16 NOTE — ED Notes (Signed)
States started to feel bad at 11 am  Has hx of 2 strokes  W/ rt side def has rt sided facial droop   States this am it was worse pt able to walk has equal squeeze and can raise arms 10 ct and legs to count of 5

## 2011-11-16 NOTE — ED Notes (Signed)
Dr. Reynolds, Neurologist at bedside. 

## 2011-11-16 NOTE — ED Notes (Signed)
PA at bedside.

## 2011-11-16 NOTE — ED Provider Notes (Signed)
History     CSN: [id number]  Arrival date & time 11/16/11  1119   First MD Initiated Contact with Patient 11/16/11 1219      No chief complaint on file.   (Consider location/radiation/quality/duration/timing/severity/associated sxs/prior treatment) HPI Comments: Anna Lyons is a 56 y.o. Female who presents with complaint of right facial droop and increased right hand weakness. States woke up, unsure if had symptoms at that time, ate breakfast, states went to brush her teeth when noted that her right side of the mouth was drooping and right hand was weaker. Pt states her husband drove her here.    Past Medical History  Diagnosis Date  . Depression   . Hypertension   . Angina   . Restless leg syndrome   . TIA (transient ischemic attack) 08/2010  . Stroke 04/20/2011    a. 04/20/11 Left subcortical infarct treated w/ TPA;  b. 04/2011 Echo: EF 55-60%;  c. 04/2011 Normal Carotid u/s  d. Residual "walk w/limp on right; unable to grasp w/right hand"  . Tobacco abuse     a. quit @ time of CVA 04/2011.    Past Surgical History  Procedure Date  . Mastectomy 1980's    bilateral with reconstruction  . Breast reconstruction     bilaterally  . Cesarean section 1979  . Knee arthroscopy 1990's    left; cartilage repair  . Carpal tunnel release 1990's    left    Family History  Problem Relation Age of Onset  . Lupus Mother     died early 42's.  . Emphysema Father     died late 28's.  [name] COPD Father     History  Substance Use Topics  . Smoking status: Former Smoker -- 0.5 packs/day for 37 years    Types: Cigarettes    Quit date: 04/20/2011  . Smokeless tobacco: Never Used   Comment: quit 04/20/2011.  [name] Alcohol Use: No    OB History    Grav Para Term Preterm Abortions TAB SAB Ect Mult Living                  Review of Systems  Constitutional: Negative for fever and chills.  HENT: Negative for neck pain and neck stiffness.   Eyes: Negative for pain and visual disturbance.    Respiratory: Negative for chest tightness and shortness of breath.   Cardiovascular: Negative for chest pain, palpitations and leg swelling.  Gastrointestinal: Negative for abdominal pain.  Genitourinary: Negative for dysuria.  Musculoskeletal: Negative.   Skin: Negative.   Neurological: Positive for weakness and numbness. Negative for dizziness and headaches.  Psychiatric/Behavioral: Negative.     Allergies  Codeine  Home Medications   Current Outpatient Rx  Name Route Sig Dispense Refill  . ASPIRIN EC 325 MG PO TBEC Oral Take 325 mg by mouth daily.    [name] HYDROCHLOROTHIAZIDE 25 MG PO TABS Oral Take 25 mg by mouth daily.    [name] PANTOPRAZOLE SODIUM 40 MG PO TBEC Oral Take 40 mg by mouth daily as needed. indigestion    . POTASSIUM CHLORIDE ER 10 MEQ PO TBCR Oral Take 10 mEq by mouth 2 (two) times daily.    [name] ROPINIROLE HCL 0.5 MG PO TABS Oral Take 0.5 mg by mouth at bedtime.    . SERTRALINE HCL 50 MG PO TABS Oral Take 50 mg by mouth daily.    . TRAMADOL HCL 50 MG PO TABS Oral Take 50 mg by mouth daily. For pain.  BP 118/74  Pulse 82  Temp 97.9 F (36.6 C)  Resp 16  SpO2 95%  Physical Exam  Nursing note and vitals reviewed. Constitutional: She is oriented to person, place, and time. She appears well-developed and well-nourished. No distress.  Cardiovascular: Normal rate, regular rhythm and normal heart sounds.   Pulmonary/Chest: Effort normal and breath sounds normal. No respiratory distress. She has no wheezes. She has no rales.  Abdominal: Soft. Bowel sounds are normal. She exhibits no distension. There is no tenderness. There is no rebound.  Musculoskeletal: Normal range of motion.  Neurological: She is alert and oriented to person, place, and time.       5/5 and equal upper and lower extremity strength bilaterally. Decreased sensation over right hand and forearm compared to left. Equal grip strength bilaterally. finger to nose slower and less coordinated on right. normal  heel to shin. No pronator drift. Patellar reflexes 2+   Skin: Skin is warm and dry.    ED Course  Procedures (including critical care time)  12:33 PM Pt seen and examined. Pt with right mouth drooping and right hand weakness. Unsure of onset, states noticed when went to brush her teeth around 11am. Cannot remember if had any symptoms when woke up. States already has some right hand weakness from prior stroke, but states these symptoms are worse. I discussed this with Dr. Preston Fleeting at this time, for potential Code Stroke. He will be in to see pt. Will get CT, labs.   Results for orders placed during the hospital encounter of 11/16/11  CBC WITH DIFFERENTIAL      Component Value Range   WBC 5.0  4.0 - 10.5 K/uL   RBC 4.01  3.87 - 5.11 MIL/uL   Hemoglobin 13.9  12.0 - 15.0 g/dL   HCT 45.4  09.8 - 11.9 %   MCV 95.8  78.0 - 100.0 fL   MCH 34.7 (*) 26.0 - 34.0 pg   MCHC 36.2 (*) 30.0 - 36.0 g/dL   RDW 14.7  82.9 - 56.2 %   Platelets 183  150 - 400 K/uL   Neutrophils Relative 64  43 - 77 %   Neutro Abs 3.2  1.7 - 7.7 K/uL   Lymphocytes Relative 27  12 - 46 %   Lymphs Abs 1.4  0.7 - 4.0 K/uL   Monocytes Relative 7  3 - 12 %   Monocytes Absolute 0.3  0.1 - 1.0 K/uL   Eosinophils Relative 2  0 - 5 %   Eosinophils Absolute 0.1  0.0 - 0.7 K/uL   Basophils Relative 0  0 - 1 %   Basophils Absolute 0.0  0.0 - 0.1 K/uL  COMPREHENSIVE METABOLIC PANEL      Component Value Range   Sodium 134 (*) 135 - 145 mEq/L   Potassium 3.2 (*) 3.5 - 5.1 mEq/L   Chloride 95 (*) 96 - 112 mEq/L   CO2 29  19 - 32 mEq/L   Glucose, Bld 108 (*) 70 - 99 mg/dL   BUN 9  6 - 23 mg/dL   Creatinine, Ser 1.30  0.50 - 1.10 mg/dL   Calcium 9.5  8.4 - 86.5 mg/dL   Total Protein 7.6  6.0 - 8.3 g/dL   Albumin 3.7  3.5 - 5.2 g/dL   AST 25  0 - 37 U/L   ALT 15  0 - 35 U/L   Alkaline Phosphatase 70  39 - 117 U/L   Total Bilirubin 0.5  0.3 -  1.2 mg/dL   GFR calc non Af Amer >90  >90 mL/min   GFR calc Af Amer >90  >90 mL/min    PROTIME-INR      Component Value Range   Prothrombin Time 13.2  11.6 - 15.2 seconds   INR 1.01  0.00 - 1.49  APTT      Component Value Range   aPTT 30  24 - 37 seconds  CBC      Component Value Range   WBC 5.9  4.0 - 10.5 K/uL   RBC 3.95  3.87 - 5.11 MIL/uL   Hemoglobin 13.4  12.0 - 15.0 g/dL   HCT 45.4  09.8 - 11.9 %   MCV 96.2  78.0 - 100.0 fL   MCH 33.9  26.0 - 34.0 pg   MCHC 35.3  30.0 - 36.0 g/dL   RDW 14.7  82.9 - 56.2 %   Platelets 166  150 - 400 K/uL  DIFFERENTIAL      Component Value Range   Neutrophils Relative 68  43 - 77 %   Neutro Abs 4.0  1.7 - 7.7 K/uL   Lymphocytes Relative 23  12 - 46 %   Lymphs Abs 1.4  0.7 - 4.0 K/uL   Monocytes Relative 7  3 - 12 %   Monocytes Absolute 0.4  0.1 - 1.0 K/uL   Eosinophils Relative 1  0 - 5 %   Eosinophils Absolute 0.1  0.0 - 0.7 K/uL   Basophils Relative 1  0 - 1 %   Basophils Absolute 0.0  0.0 - 0.1 K/uL  COMPREHENSIVE METABOLIC PANEL      Component Value Range   Sodium 136  135 - 145 mEq/L   Potassium 3.3 (*) 3.5 - 5.1 mEq/L   Chloride 98  96 - 112 mEq/L   CO2 30  19 - 32 mEq/L   Glucose, Bld 99  70 - 99 mg/dL   BUN 11  6 - 23 mg/dL   Creatinine, Ser 1.30  0.50 - 1.10 mg/dL   Calcium 9.3  8.4 - 86.5 mg/dL   Total Protein 7.2  6.0 - 8.3 g/dL   Albumin 3.6  3.5 - 5.2 g/dL   AST 27  0 - 37 U/L   ALT 15  0 - 35 U/L   Alkaline Phosphatase 68  39 - 117 U/L   Total Bilirubin 0.4  0.3 - 1.2 mg/dL   GFR calc non Af Amer >90  >90 mL/min   GFR calc Af Amer >90  >90 mL/min  TROPONIN I      Component Value Range   Troponin I <0.30  <0.30 ng/mL  POCT I-STAT, CHEM 8      Component Value Range   Sodium 135  135 - 145 mEq/L   Potassium 3.3 (*) 3.5 - 5.1 mEq/L   Chloride 97  96 - 112 mEq/L   BUN 10  6 - 23 mg/dL   Creatinine, Ser 7.84  0.50 - 1.10 mg/dL   Glucose, Bld 98  70 - 99 mg/dL   Calcium, Ion 6.96  2.95 - 1.23 mmol/L   TCO2 28  0 - 100 mmol/L   Hemoglobin 12.9  12.0 - 15.0 g/dL   HCT 28.4  13.2 - 44.0 %  POCT  I-STAT TROPONIN I      Component Value Range   Troponin i, poc 0.00  0.00 - 0.08 ng/mL   Comment 3  GLUCOSE, CAPILLARY      Component Value Range   Glucose-Capillary 85  70 - 99 mg/dL   Comment 1 Documented in Chart     Comment 2 Notify RN     Ct Head Wo Contrast  11/16/2011  *RADIOLOGY REPORT*  Clinical Data: Right facial droop  CT HEAD WITHOUT CONTRAST  Technique:  Contiguous axial images were obtained from the base of the skull through the vertex without contrast.  Comparison: 05/20/2011  Findings: Evidence of prior basal ganglia/external capsule infarction on the left, extends superiorly along the left periventricular white matter. Oval CSF attenuation within the right basal ganglia is most in keeping with a prominent perivascular space. Mild periventricular white matter hypoattenuation is nonspecific however most in keeping with chronic microangiopathic change.  Otherwise, there is no evidence for acute hemorrhage, hydrocephalus, mass lesion, or abnormal extra-axial fluid collection.  No definite CT evidence for acute infarction.  The visualized paranasal sinuses and mastoid air cells are predominately clear with mild mastoid air cell opacity on the right that is similar to prior.  IMPRESSION: Remote left basal ganglia lacunar infarction. Mild white matter changes as above.  No definite CT evidence for acute intracranial abnormality.  Discussed via telephone with Dr. Preston Fleeting at 01:05 p.m. on 11/16/2011.   Original Report Authenticated By: Waneta Martins, M.D.     CT negative. Pt ws seen by neurology, advised admission to medicine for further evaluation. Dr. Preston Fleeting to call for medical admission.  Filed Vitals:   11/16/11 1445  BP: 133/78  Pulse: 74  Temp:   Resp: 16     1. Stroke   2. Right hemiparesis   3. Unspecified transient cerebral ischemia       MDM          Lottie Mussel, PA 11/16/11 1617  Lottie Mussel, PA 11/16/11 1619

## 2011-11-16 NOTE — H&P (Signed)
[facility] History and Physical  Anna Lyons ZOX:[id number] DOB: 1955/04/19 DOA: 11/16/2011  Referring physician: Dr. Preston Fleeting PCP: Kaleen Mask, MD  Specialists: Neurology: Dr. Thana Farr 11/16/2011  Chief Complaint: Right facial droop  HPI: Anna Lyons is a 56 y.o. female  with history of hypertension, depression, prior history of a CVA 04/20/2011 where she received TPA during that hospitalization. Workup included carotid Dopplers that were negative. 2-D echo with EF of a 55-60% with no source of emboli. CT angigram of extracranial vessels were negative. MRI which was done did show a left posterior lentiform nucleus stroke. Patient was discharged on aspirin at that time. Patient states has been taking aspirin on a daily basis. Patient also with a history of TIA in July of 2012, history of prior history of tobacco abuse quit in March of 2013 who presents to the ED with a right facial droop. Patient says she woke up around 8 AM and her normal state she ate breakfast around 10:30 AM and at that time had some difficulty chewing her food. Patient stated that after breakfast she went to brush her teeth around 11 AM and noted a right facial droop. Patient confirmed this with her husband and stated that she wasn't feeling well. Patient's husband subsequently brought the patient to the ED. Patient does endorse some slurred speech at that time. Patient also endorses some nausea. Patient denies any weakness, no numbness, no visual deficits, no fever, no chills, no emesis, no chest pain, no shortness of breath, no diarrhea, no constipation, no dysuria. No other associated symptoms. Patient was seen by neurology in the emergency room. The last to admit the patient for further evaluation and management. TPA was not administered secondary to rapidly improving symptoms.  Review of Systems: The patient denies anorexia, fever, weight loss,, vision loss, decreased hearing, hoarseness, chest pain,  syncope, dyspnea on exertion, peripheral edema, balance deficits, hemoptysis, abdominal pain, melena, hematochezia, severe indigestion/heartburn, hematuria, incontinence, genital sores, muscle weakness, suspicious skin lesions, transient blindness, difficulty walking, depression, unusual weight change, abnormal bleeding, enlarged lymph nodes, angioedema, and breast masses.   Past Medical History  Diagnosis Date  . Depression   . Hypertension   . Angina   . Restless leg syndrome   . TIA (transient ischemic attack) 08/2010  . Stroke 04/20/2011    a. 04/20/11 Left subcortical infarct treated w/ TPA;  b. 04/2011 Echo: EF 55-60%;  c. 04/2011 Normal Carotid u/s  d. Residual "walk w/limp on right; unable to grasp w/right hand"  . Tobacco abuse     a. quit @ time of CVA 04/2011.  [name] RLS (restless legs syndrome) 11/16/2011   Past Surgical History  Procedure Date  . Mastectomy 1980's    bilateral with reconstruction  . Breast reconstruction     bilaterally  . Cesarean section 1979  . Knee arthroscopy 1990's    left; cartilage repair  . Carpal tunnel release 1990's    left   Social History:  reports that she quit smoking about 6 months ago. Her smoking use included Cigarettes. She has a 18.5 pack-year smoking history. She has never used smokeless tobacco. She reports that she does not drink alcohol or use illicit drugs.  Allergies  Allergen Reactions  . Codeine Itching    Family History  Problem Relation Age of Onset  . Lupus Mother     died early 77's.  . Emphysema Father     died late 16's.  [name] COPD Father  Prior to Admission medications   Medication Sig Start Date End Date Taking? Authorizing Provider  aspirin EC 325 MG tablet Take 325 mg by mouth daily.   Yes Historical Provider, MD  hydrochlorothiazide (HYDRODIURIL) 25 MG tablet Take 25 mg by mouth daily.   Yes Historical Provider, MD  pantoprazole (PROTONIX) 40 MG tablet Take 40 mg by mouth daily as needed. indigestion   Yes  Historical Provider, MD  potassium chloride (K-DUR) 10 MEQ tablet Take 10 mEq by mouth 2 (two) times daily. 06/29/11 06/28/12 Yes Erick Colace, MD  rOPINIRole (REQUIP) 0.5 MG tablet Take 0.5 mg by mouth at bedtime.   Yes Historical Provider, MD  sertraline (ZOLOFT) 50 MG tablet Take 50 mg by mouth daily.   Yes Historical Provider, MD  traMADol (ULTRAM) 50 MG tablet Take 50 mg by mouth daily. For pain.   Yes Historical Provider, MD   Physical Exam: Filed Vitals:   11/16/11 1445 11/16/11 1615 11/16/11 1630 11/16/11 1715  BP: 133/78 121/75 133/88 156/83  Pulse: 74 74 70 68  Temp:      TempSrc:      Resp: 16 17 15 14   SpO2: 97% 98% 98% 98%     General:  Well-developed well-nourished sitting on gurney in no acute cardiopulmonary distress.  Eyes: Pupils equal round and reactive to light and accommodation. Extraocular movements intact.  ENT: Oropharynx is clear, no lesions, no exudates.  Neck: Supple with no lymphadenopathy.  Cardiovascular: Regular rate rhythm no murmurs rubs or gallops. No JVD. No lower extremity edema.  Respiratory: Clear to auscultation bilaterally. No wheezes, no crackles, no rhonchi.  Abdomen: Soft, nontender, nondistended, positive bowel sounds.  Skin: No rashes or lesions noted  Musculoskeletal: 5 out of 5 bilateral upper extremity strength. 5 out of 5 bilateral lower extremity strength.  Psychiatric: Normal mood. Normal affect. Good insight. Good judgment.  Neurologic: Alert and on to x3. Cranial nerves II through XII are grossly intact. No focal deficits. 5 out of 5 bilateral upper extremity strength. Sensation is intact. 2+ brachial radialis reflexes. 2+ patellar reflexes bilaterally. Visual fields are intact. Gait not tested secondary to safety.   Labs on Admission:  Basic Metabolic Panel:  Lab 11/16/11 1610 11/16/11 1300 11/16/11 1201  NA 135 136 134*  K 3.3* 3.3* 3.2*  CL 97 98 95*  CO2 -- 30 29  GLUCOSE 98 99 108*  BUN 10 11 9   CREATININE  0.90 0.77 0.75  CALCIUM -- 9.3 9.5  MG -- -- --  PHOS -- -- --   Liver Function Tests:  Lab 11/16/11 1300 11/16/11 1201  AST 27 25  ALT 15 15  ALKPHOS 68 70  BILITOT 0.4 0.5  PROT 7.2 7.6  ALBUMIN 3.6 3.7   No results found for this basename: LIPASE:5,AMYLASE:5 in the last 168 hours No results found for this basename: AMMONIA:5 in the last 168 hours CBC:  Lab 11/16/11 1314 11/16/11 1300 11/16/11 1201  WBC -- 5.9 5.0  NEUTROABS -- 4.0 3.2  HGB 12.9 13.4 13.9  HCT 38.0 38.0 38.4  MCV -- 96.2 95.8  PLT -- 166 183   Cardiac Enzymes:  Lab 11/16/11 1300  CKTOTAL --  CKMB --  CKMBINDEX --  TROPONINI <0.30    BNP (last 3 results) No results found for this basename: PROBNP:3 in the last 8760 hours CBG:  Lab 11/16/11 1415  GLUCAP 85    Radiological Exams on Admission: Ct Head Wo Contrast  11/16/2011  *RADIOLOGY REPORT*  Clinical Data: Right facial droop  CT HEAD WITHOUT CONTRAST  Technique:  Contiguous axial images were obtained from the base of the skull through the vertex without contrast.  Comparison: 05/20/2011  Findings: Evidence of prior basal ganglia/external capsule infarction on the left, extends superiorly along the left periventricular white matter. Oval CSF attenuation within the right basal ganglia is most in keeping with a prominent perivascular space. Mild periventricular white matter hypoattenuation is nonspecific however most in keeping with chronic microangiopathic change.  Otherwise, there is no evidence for acute hemorrhage, hydrocephalus, mass lesion, or abnormal extra-axial fluid collection.  No definite CT evidence for acute infarction.  The visualized paranasal sinuses and mastoid air cells are predominately clear with mild mastoid air cell opacity on the right that is similar to prior.  IMPRESSION: Remote left basal ganglia lacunar infarction. Mild white matter changes as above.  No definite CT evidence for acute intracranial abnormality.  Discussed via  telephone with Dr. Preston Fleeting at 01:05 p.m. on 11/16/2011.   Original Report Authenticated By: Waneta Martins, M.D.    Mr Brain Wo Contrast  11/16/2011  *RADIOLOGY REPORT*  Clinical Data: Onset today of right facial droop. Symptoms now resolved. History of hypertension.  History of tobacco abuse.  MRI HEAD WITHOUT CONTRAST  Technique:  Multiplanar, multiecho pulse sequences of the brain and surrounding structures were obtained according to standard protocol without intravenous contrast.  Comparison: CT head earlier in the day.  MR head 04/21/2011.  Findings: The patient is status post acute left-sided non- hemorrhagic posterior basal ganglia and periventricular white matter infarct 04/21/2011.  This is associated with encephalomalacia on today's examination.  There are subcentimeter periventricular foci of abnormal signal on diffusion weighted imaging in this region which could represent acute on chronic ischemia (see images 16 and 17, series 4, arrows).  No areas of restricted diffusion elsewhere.  There is no acute hemorrhage, mass lesion, or hydrocephalus.  There is mild premature atrophy. A microvascular ischemic change is noted.  Bilateral basal ganglier remote lacunar infarcts are noted. Slight wallerian degeneration left brainstem.  Major intracranial vascular structures patent.  Normal midline anatomy.  Negative osseous structures. No sinus or orbital disease.  Chronic right mastoid fluid unchanged.  No nasopharyngeal mass.  IMPRESSION: Chronic left hemisphere infarction as described.  Small foci of restricted diffusion in the left periventricular region could represent acute on chronic ischemia.  Mild atrophy and mild chronic microvascular ischemic change.   Original Report Authenticated By: Elsie Stain, M.D.     EKG: None  Assessment/Plan Principal Problem:  *Unspecified transient cerebral ischemia Active Problems:  Depression  History of stroke  Essential hypertension, benign   Hypokalemia  RLS (restless legs syndrome)  #1 probable TIA versus CVA Patient with resolving symptoms. CT scan of the head was negative. MRI of the head is pending. Patient had a recent full stroke workup of carotid Dopplers and 2-D echo done in March of 2013 and a such we'll not repeat these. Will check a fasting lipid panel. Check a hemoglobin A1c. Patient has agreed to be part of the point trial. Neurology is following and appreciate your input and recommendations.  #2 hypertension Stable. Continue home regimen of hydrochlorothiazide.  #3 hypokalemia Replete.  #4 depression Continue home dose of Zoloft.  #5 restless leg syndrome Resume Requip  #6 prophylaxis PPI for GI prophylaxis. SCDs for DVT prophylaxis.  Code Status: Full Family Communication: Updated patient, husband, daughter at bedside Disposition Plan: Admit to observation  Time spent: 71  mins  [facility] Pager [phone]  If 7PM-7AM, please contact night-coverage [email or web address] Password [email or web address] 11/16/2011, 5:20 PM

## 2011-11-17 DIAGNOSIS — D649 Anemia, unspecified: Secondary | ICD-10-CM

## 2011-11-17 DIAGNOSIS — I6350 Cerebral infarction due to unspecified occlusion or stenosis of unspecified cerebral artery: Secondary | ICD-10-CM

## 2011-11-17 LAB — BASIC METABOLIC PANEL
CO2: 27 mEq/L (ref 19–32)
Chloride: 101 mEq/L (ref 96–112)
GFR calc non Af Amer: >90 mL/min (ref >90)
Glucose, Bld: 94 mg/dL (ref 70–99)
Potassium: 3.6 mEq/L (ref 3.5–5.1)
Sodium: 136 mEq/L (ref 135–145)

## 2011-11-17 LAB — RAPID URINE DRUG SCREEN, HOSP PERFORMED
Amphetamines: NOT DETECTED
Barbiturates: NOT DETECTED
Cocaine: NOT DETECTED
Opiates: NOT DETECTED
Tetrahydrocannabinol: NOT DETECTED

## 2011-11-17 LAB — LIPID PANEL
Cholesterol: 158 mg/dL (ref 0–200)
Total CHOL/HDL Ratio: 3.7 RATIO
VLDL: 22 mg/dL (ref 0–40)

## 2011-11-17 LAB — CBC
Hemoglobin: 11.7 g/dL — ABNORMAL LOW (ref 12.0–15.0)
MCH: 33.5 pg (ref 26.0–34.0)
RBC: 3.49 MIL/uL — ABNORMAL LOW (ref 3.87–5.11)

## 2011-11-17 MED ORDER — PNEUMOCOCCAL VAC POLYVALENT 25 MCG/0.5ML IJ INJ
0.5000 mL | INJECTION | INTRAMUSCULAR | Status: DC
  Filled 2011-11-17: qty 0.5

## 2011-11-17 MED ORDER — STUDY - INVESTIGATIONAL DRUG SIMPLE RECORD: 75.0000 mg | Freq: Every day | Status: DC

## 2011-11-17 MED ORDER — PNEUMOCOCCAL VAC POLYVALENT 25 MCG/0.5ML IJ INJ
0.5000 mL | INJECTION | Freq: Once | INTRAMUSCULAR | Status: AC
  Administered 2011-11-17: 0.5 mL via INTRAMUSCULAR
  Filled 2011-11-17: qty 0.5

## 2011-11-17 NOTE — Progress Notes (Signed)
Stroke Team Progress Note  HISTORY Anna Lyons is an 56 y.o. female who was last seen in hospital for stroke on march of 2013. At that time she received tPA. Work up showed normal carotid dopplers, 2 D echo showed 55-60 % EF,negative CT angio of extracranial vessels, negative CT brain, and positive stroke in the left posterior lentiform nucleus seen on MRI.  She was discharged on ASA 325 at that time. She was doing well until 10/1502013 am. She awoke up at 8 AM in her normal state. She ate breakfast at 10:30 and noted she had difficulty chewing her food. She brushed her teeth at 11 am and noted a right facial droop. No other symptoms. Initial CT head showed no acute infarct or bleed. Facial droop was still present. During further exam her facial droop improved and she is now back to her baseline. Initial NIHSS of 2 for right facial droop and pronator drift. Code stroke was canceled due to rapidly resolving symptoms.  Patient was not a TPA candidate secondary to rapid improvement. She was admitted for further evaluation and treatment.  SUBJECTIVE Her husband and daughter are at the bedside.  Overall she feels her condition is completely resolved. She wants to know why she had a TIA if she was doing everything right.  OBJECTIVE Most recent Vital Signs: Filed Vitals:   11/16/11 2231 11/17/11 0159 11/17/11 0411 11/17/11 0607  BP:  133/85 144/74 138/86  Pulse:  66 63 59  Temp:  97.9 F (36.6 C) 97.6 F (36.4 C) 98 F (36.7 C)  TempSrc:  Oral Oral Oral  Resp:  18 18 18   Height: 5\' 5"  (1.651 m)     Weight: 65.573 kg (144 lb 9 oz)     SpO2:  98% 95% 97%   CBG (last 3)   Basename 11/16/11 1415  GLUCAP 85    IV Fluid Intake:     . sodium chloride 75 mL/hr at 11/16/11 2225    MEDICATIONS    . aspirin EC  325 mg Oral Daily  . hydrochlorothiazide  25 mg Oral Daily  . influenza  inactive virus vaccine  0.5 mL Intramuscular Tomorrow-1000  . pantoprazole  40 mg Oral Daily  . pneumococcal  23 valent vaccine  0.5 mL Intramuscular Tomorrow-1000  . Point Trial - clopidogrel / placebo (daily dose)  (PI-Sethi)  75 mg Oral Q breakfast  . Point Trial - clopidogrel / placebo (now dose)  (PI-Sethi)  600 mg Oral NOW  . potassium chloride  10 mEq Oral BID  . potassium chloride  40 mEq Oral Once  . rOPINIRole  0.5 mg Oral QHS  . sertraline  50 mg Oral Daily  . traMADol  50 mg Oral Daily  . DISCONTD: clopidogrel  75 mg Oral Q breakfast   PRN:  acetaminophen, acetaminophen, ondansetron (ZOFRAN) IV, senna-docusate  Diet:  Cardiac thin liquids Activity:  Bedrest with Bathroom privileges, OOB with assistance DVT Prophylaxis:  SCDs   CLINICALLY SIGNIFICANT STUDIES Basic Metabolic Panel:  Lab 11/17/11 1610 11/16/11 1314 11/16/11 1300  NA 136 135 --  K 3.6 3.3* --  CL 101 97 --  CO2 27 -- 30  GLUCOSE 94 98 --  BUN 10 10 --  CREATININE 0.63 0.90 --  CALCIUM 8.7 -- 9.3  MG -- -- --  PHOS -- -- --   Liver Function Tests:  Lab 11/16/11 1300 11/16/11 1201  AST 27 25  ALT 15 15  ALKPHOS 68 70  BILITOT 0.4  0.5  PROT 7.2 7.6  ALBUMIN 3.6 3.7   CBC:  Lab 11/17/11 0603 11/16/11 1314 11/16/11 1300 11/16/11 1201  WBC 3.9* -- 5.9 --  NEUTROABS -- -- 4.0 3.2  HGB 11.7* 12.9 -- --  HCT 33.7* 38.0 -- --  MCV 96.6 -- 96.2 --  PLT 152 -- 166 --   Coagulation:  Lab 11/16/11 1300  LABPROT 13.2  INR 1.01   Cardiac Enzymes:  Lab 11/16/11 1300  CKTOTAL --  CKMB --  CKMBINDEX --  TROPONINI <0.30   Urinalysis: No results found for this basename: COLORURINE:2,APPERANCEUR:2,LABSPEC:2,PHURINE:2,GLUCOSEU:2,HGBUR:2,BILIRUBINUR:2,KETONESUR:2,PROTEINUR:2,UROBILINOGEN:2,NITRITE:2,LEUKOCYTESUR:2 in the last 168 hours Lipid Panel    Component Value Date/Time   CHOL 158 11/17/2011 0603   TRIG 110 11/17/2011 0603   HDL 43 11/17/2011 0603   CHOLHDL 3.7 11/17/2011 0603   VLDL 22 11/17/2011 0603   LDLCALC 93 11/17/2011 0603   HgbA1C  Lab Results  Component Value Date   HGBA1C 5.3  05/19/2011    Urine Drug Screen:   No results found for this basename: labopia, cocainscrnur, labbenz, amphetmu, thcu, labbarb    Alcohol Level: No results found for this basename: ETH:2 in the last 168 hours    CT of the brain  11/16/2011  Remote left basal ganglia lacunar infarction. Mild white matter changes as above.  No definite CT evidence for acute intracranial abnormality.   MRI of the brain  11/16/2011  Chronic left hemisphere infarction as described.  Small foci of restricted diffusion in the left periventricular region could represent acute on chronic ischemia.  Mild atrophy and mild chronic microvascular ischemic change.     CXR  11/16/2011   Cardiomegaly without congestive failure.   Therapy Recommendations no therapy needs  Physical Exam   Pleasant elderly Caucasian lady currently not in distress.Awake alert. Afebrile. Head is nontraumatic. Neck is supple without bruit. Hearing is normal. Cardiac exam no murmur or gallop. Lungs are clear to auscultation. Distal pulses are well felt. Neurological Exam :  Awake alert oriented to time place and person. Intact attention, registration and recall. Extraocular moments are full range without nystagmus. Fundi were not visualized. Vision acuity and fields appear normal. There is minimum right nasolabial fold asymmetry when she smiles. No facial weakness. Tongue is midline. Motor system exam reveals no upper or lower expected risk. Symmetric and equal strength in all 4 extremities. Deep tendon flexes are 1+ symmetric. Plantars are downgoing. Sensation and cognition are equal bilaterally. Gait was not tested. ASSESSMENT Anna Lyons is a 56 y.o. female presenting with right facial weakness. Imaging negative for acute stroke. Diagnosis: Left brain TIA.  TIA secondard to small vessel disease Work up completed. On aspirin 325 mg orally every day prior to admission. Now on aspirin 325 mg orally every day and and POINT study drug (plavix vs  placebo) for secondary stroke prevention. Patient with no resultant neuro symptoms.  Hypertension Depression RLS TIA 08/2010  left subcortical infarct March 2013 s/p TPA  Tobacco abuse, quit March 2013  LDL 93  Hospital day # 1  TREATMENT/PLAN  Continue aspirin 325 mg orally every day and POINT study drug for secondary stroke prevention.  Discharge pt home with POINT study drug from our pharmacy  Cancel PT, OT and ST as patient back to baseline  [facility] for discharge from neuro standpoint. D/W Dr Rueben Bash  Follow up Dr. Pearlean Brownie in 2 mos  Annie Main, MSN, RN, ANVP-BC, ANP-BC, GNP-BC [facility] Pager: [phone] 11/17/2011 9:55 AM  Scribe  for Dr. Delia Heady, Stroke Center Medical Director, who has personally reviewed chart, pertinent data, examined the patient and developed the plan of care. Pager:  [phone]

## 2011-11-17 NOTE — Progress Notes (Signed)
   CARE MANAGEMENT NOTE 11/17/2011  Patient:  Anna Lyons, Anna Lyons   Account Number:  [id number]  Date Initiated:  11/17/2011  Documentation initiated by:  [facility]  Subjective/Objective Assessment:     Action/Plan:   lives at home with husband   Anticipated DC Date:  11/17/2011   Anticipated DC Plan:  HOME/SELF CARE      DC Planning Services  CM consult  Medication Assistance      Choice offered to / List presented to:             Status of service:  Completed, signed off Medicare Important Message given?   (If response is "NO", the following Medicare IM given date fields will be blank) Date Medicare IM given:   Date Additional Medicare IM given:    Discharge Disposition:  HOME/SELF CARE  Per UR Regulation:    If discussed at Long Length of Stay Meetings, dates discussed:    Comments:  11/17/2011 1430 NCM spoke to pt and states she is unable to afford her medications at this time. She has Rx at her pharmacy waiting to pick up. She uses [facility] for $4 for 30 days for her generic meds. She is on Requip, Zoloft and Protonix. These meds have Drug Manufacture PAP programs she could apply to receive assistance. Gave pt applications for Rx Outreach and Connection Care to have her PCP complete. Explained that if she is approved she can receive up to a year in free meds. Provided community discount card for pt that may assist with meds she pay for out of pocket. No d/c Rx for pt this admission. She did qualify for [employer] med fund, but will not use this admission. Isidoro Donning RN CCM Case Mgmt phone [phone]

## 2011-11-17 NOTE — Progress Notes (Signed)
Pt for discharge home today IV D/C.  D/C instructions given with verbalized understanding.  Family at bedside to assist pt with discharge. Staff brought pt downstairs via wheelchair.

## 2011-11-17 NOTE — Discharge Summary (Signed)
Physician Discharge Summary  JAMAIYA TUNNELL ZOX:[id number] DOB: 05-25-1955 DOA: 11/16/2011  PCP: Kaleen Mask, MD  Admit date: 11/16/2011 Discharge date: 11/17/2011  Recommendations for Outpatient Follow-up:  1. Followup with Dr. Windle Guard, PCP in 2 weeks from hospital discharge. 2. Followup with Dr. Delia Heady, Neurology in 2 months from hospital discharge.  Discharge Diagnoses:  Principal Problem:  *Unspecified transient cerebral ischemia Active Problems:  Depression  History of stroke  Essential hypertension, benign  Hypokalemia  RLS (restless legs syndrome)   Discharge Condition: Improved and stable.  Diet recommendation: Heart healthy diet.  Filed Weights   11/16/11 2231  Weight: 65.573 kg (144 lb 9 oz)    History of present illness:  Anna Lyons is an 56 y.o. female with history of hypertension, depression who was last seen in hospital for stroke on march of 2013. At that time she received tPA. Work up showed normal carotid dopplers, 2 D echo showed 55-60 % EF,negative CT angio of extracranial vessels, negative CT brain, and positive stroke in the left posterior lentiform nucleus seen on MRI. She was discharged on ASA 325 at that time. She was doing well until 11/16/2011 am. She awoke up at 8 AM in her normal state. She ate breakfast at 10:30 and noted she had difficulty chewing her food. She brushed her teeth at 11 am and noted a right facial droop. No other symptoms. Initial CT head showed no acute infarct or bleed. Facial droop was still present. During further exam her facial droop improved and she returned back to her baseline. Initial NIHSS of 2 for right facial droop and pronator drift. Code stroke was canceled due to rapidly resolving symptoms. Patient was not a TPA candidate secondary to rapid improvement. She was admitted for further evaluation and treatment.   Hospital Course:  1. Left brain TIA: Likely secondary to small vessel disease. Neurology was  consulted. Patient was on aspirin 325 mg daily prior to admission. MRI head was negative for acute stroke. Neurology recommended continuing aspirin and added POINT study drug (Plavix versus placebo) for secondary stroke prevention. The study drug will be provided from the hospital pharmacy. Patient has returned to baseline and hence did not get PT, OT or ST evaluation. She is to followup with Dr. Pearlean Brownie in 2 months. 2. Hypertension: Controlled. Continue home medications. 3. Hypokalemia: Repleted. 4. Anemia and mild leukopenia: Unclear etiology. No bleeding. Follow up CBC in 2 weeks from discharge. 5. Pre Diabetes: Diet control. Recommend repeating A1c in 3 months. 6. Depression: Stable. Continue Zoloft. 7. History of restless leg syndrome: Continue Requip. 8. History of tobacco abuse: Quit in March 2013.  Procedures:  CT of the brain 11/16/2011 Remote left basal ganglia lacunar infarction. Mild white matter changes as above. No definite CT evidence for acute intracranial abnormality.   MRI of the brain 11/16/2011 Chronic left hemisphere infarction as described. Small foci of restricted diffusion in the left periventricular region could represent acute on chronic ischemia. Mild atrophy and mild chronic microvascular ischemic change.   CXR 11/16/2011 Cardiomegaly without congestive failure.    Consultations:  Neurology.  Discharge Exam:  Complaints Denies complaints. Right-sided facial numbness/droop has resolved.   Filed Vitals:   11/16/11 2231 11/17/11 0159 11/17/11 0411 11/17/11 0607  BP:  133/85 144/74 138/86  Pulse:  66 63 59  Temp:  97.9 F (36.6 C) 97.6 F (36.4 C) 98 F (36.7 C)  TempSrc:  Oral Oral Oral  Resp:  18 18 18   Height: 5'  5" (1.651 m)     Weight: 65.573 kg (144 lb 9 oz)     SpO2:  98% 95% 97%     General exam: Comfortable.  Respiratory system: Clear. No increased work of breathing.  Cardiovascular system: S1 and S2 heard, regular rate and rhythm. No JVD  or murmurs. Telemetry shows sinus rhythm.  Gastrointestinal system: Abdomen is nondistended, soft and nontender. Normal bowel sounds heard.  Central nervous system: Alert and oriented x3. No cranial nerve deficits.  Extremities: Symmetrical 5 x 5 power.  Discharge Instructions      Discharge Orders    Future Orders Please Complete By Expires   Diet - low sodium heart healthy      Increase activity slowly          Medication List     As of 11/17/2011 12:55 PM    TAKE these medications         aspirin EC 325 MG tablet   Take 325 mg by mouth daily.      hydrochlorothiazide 25 MG tablet   Commonly known as: HYDRODIURIL   Take 25 mg by mouth daily.      INVESTIGATIONAL DRUG SIMPLE RECORD   Take 75 mg by mouth daily with breakfast. Point Trial - clopidogrel / placebo (daily dose)  (PI-Sethi)      pantoprazole 40 MG tablet   Commonly known as: PROTONIX   Take 40 mg by mouth daily as needed. indigestion      potassium chloride 10 MEQ tablet   Commonly known as: K-DUR   Take 10 mEq by mouth 2 (two) times daily.      rOPINIRole 0.5 MG tablet   Commonly known as: REQUIP   Take 0.5 mg by mouth at bedtime.      sertraline 50 MG tablet   Commonly known as: ZOLOFT   Take 50 mg by mouth daily.      traMADol 50 MG tablet   Commonly known as: ULTRAM   Take 50 mg by mouth daily. For pain.        Follow-up Information    Follow up with Kaleen Mask, MD. Schedule an appointment as soon as possible for a visit in 2 weeks.   Contact information:   [address] [place] [address] [phone]       Follow up with Gates Rigg, MD. Schedule an appointment as soon as possible for a visit in 2 months. (Stroke Clinic)    Contact information:   [address] [facility] [place] [address] [phone]           The results of significant diagnostics from this hospitalization (including imaging, microbiology,  ancillary and laboratory) are listed below for reference.    Significant Diagnostic Studies: Dg Chest 2 View  11/16/2011  *RADIOLOGY REPORT*  Clinical Data: Stroke.  Tobacco abuse.  Prior mastectomy.  CHEST - 2 VIEW  Comparison: 05/19/2011  Findings: Suspect mild hyperinflation.  Probable right mastectomy. Midline trachea.  Mild cardiomegaly with a tortuous descending thoracic aorta. No pleural effusion or pneumothorax.  No congestive failure.  Clear lungs.  IMPRESSION: Cardiomegaly without congestive failure.   Original Report Authenticated By: Consuello Bossier, M.D.    Ct Head Wo Contrast  11/16/2011  *RADIOLOGY REPORT*  Clinical Data: Right facial droop  CT HEAD WITHOUT CONTRAST  Technique:  Contiguous axial images were obtained from the base of the skull through the vertex without contrast.  Comparison: 05/20/2011  Findings: Evidence  of prior basal ganglia/external capsule infarction on the left, extends superiorly along the left periventricular white matter. Oval CSF attenuation within the right basal ganglia is most in keeping with a prominent perivascular space. Mild periventricular white matter hypoattenuation is nonspecific however most in keeping with chronic microangiopathic change.  Otherwise, there is no evidence for acute hemorrhage, hydrocephalus, mass lesion, or abnormal extra-axial fluid collection.  No definite CT evidence for acute infarction.  The visualized paranasal sinuses and mastoid air cells are predominately clear with mild mastoid air cell opacity on the right that is similar to prior.  IMPRESSION: Remote left basal ganglia lacunar infarction. Mild white matter changes as above.  No definite CT evidence for acute intracranial abnormality.  Discussed via telephone with Dr. Preston Fleeting at 01:05 p.m. on 11/16/2011.   Original Report Authenticated By: Waneta Martins, M.D.    Mr Brain Wo Contrast  11/16/2011  *RADIOLOGY REPORT*  Clinical Data: Onset today of right facial droop.  Symptoms now resolved. History of hypertension.  History of tobacco abuse.  MRI HEAD WITHOUT CONTRAST  Technique:  Multiplanar, multiecho pulse sequences of the brain and surrounding structures were obtained according to standard protocol without intravenous contrast.  Comparison: CT head earlier in the day.  MR head 04/21/2011.  Findings: The patient is status post acute left-sided non- hemorrhagic posterior basal ganglia and periventricular white matter infarct 04/21/2011.  This is associated with encephalomalacia on today's examination.  There are subcentimeter periventricular foci of abnormal signal on diffusion weighted imaging in this region which could represent acute on chronic ischemia (see images 16 and 17, series 4, arrows).  No areas of restricted diffusion elsewhere.  There is no acute hemorrhage, mass lesion, or hydrocephalus.  There is mild premature atrophy. A microvascular ischemic change is noted.  Bilateral basal ganglier remote lacunar infarcts are noted. Slight wallerian degeneration left brainstem.  Major intracranial vascular structures patent.  Normal midline anatomy.  Negative osseous structures. No sinus or orbital disease.  Chronic right mastoid fluid unchanged.  No nasopharyngeal mass.  IMPRESSION: Chronic left hemisphere infarction as described.  Small foci of restricted diffusion in the left periventricular region could represent acute on chronic ischemia.  Mild atrophy and mild chronic microvascular ischemic change.   Original Report Authenticated By: Elsie Stain, M.D.     Microbiology: No results found for this or any previous visit (from the past 240 hour(s)).   Labs: Basic Metabolic Panel:  Lab 11/17/11 1610 11/16/11 1314 11/16/11 1300 11/16/11 1201  NA 136 135 136 134*  K 3.6 3.3* 3.3* 3.2*  CL 101 97 98 95*  CO2 27 -- 30 29  GLUCOSE 94 98 99 108*  BUN 10 10 11 9   CREATININE 0.63 0.90 0.77 0.75  CALCIUM 8.7 -- 9.3 9.5  MG -- -- -- --  PHOS -- -- -- --    Liver Function Tests:  Lab 11/16/11 1300 11/16/11 1201  AST 27 25  ALT 15 15  ALKPHOS 68 70  BILITOT 0.4 0.5  PROT 7.2 7.6  ALBUMIN 3.6 3.7   No results found for this basename: LIPASE:5,AMYLASE:5 in the last 168 hours No results found for this basename: AMMONIA:5 in the last 168 hours CBC:  Lab 11/17/11 0603 11/16/11 1314 11/16/11 1300 11/16/11 1201  WBC 3.9* -- 5.9 5.0  NEUTROABS -- -- 4.0 3.2  HGB 11.7* 12.9 13.4 13.9  HCT 33.7* 38.0 38.0 38.4  MCV 96.6 -- 96.2 95.8  PLT 152 -- 166 183   Cardiac  Enzymes:  Lab 11/16/11 1300  CKTOTAL --  CKMB --  CKMBINDEX --  TROPONINI <0.30   BNP: BNP (last 3 results) No results found for this basename: PROBNP:3 in the last 8760 hours CBG:  Lab 11/16/11 1415  GLUCAP 85   Other lab data  1. Lipid panel: Cholesterol 158, triglycerides 110, HDL 43, LDL 93, VLDL 22. 2. Hemoglobin A1c: 5.7. 3. INR: 1.01   Time coordinating discharge: Less than 30 minutes  Signed:  Dewon Mendizabal  [facility] 11/17/2011, 12:55 PM

## 2012-03-28 ENCOUNTER — Other Ambulatory Visit: Payer: Self-pay | Admitting: Family Medicine

## 2012-05-19 ENCOUNTER — Emergency Department (HOSPITAL_COMMUNITY): Payer: Medicaid Other

## 2012-05-19 ENCOUNTER — Inpatient Hospital Stay (HOSPITAL_COMMUNITY): Payer: Medicaid Other

## 2012-05-19 ENCOUNTER — Inpatient Hospital Stay (HOSPITAL_COMMUNITY)
Admission: EM | Admit: 2012-05-19 | Discharge: 2012-05-21 | DRG: 100 | Disposition: A | Discharge status: Home / Self Care | Payer: Medicaid Other | Attending: Internal Medicine | Admitting: Internal Medicine

## 2012-05-19 ENCOUNTER — Encounter (HOSPITAL_COMMUNITY): Payer: Self-pay | Admitting: Emergency Medicine

## 2012-05-19 ENCOUNTER — Inpatient Hospital Stay (HOSPITAL_COMMUNITY)
Admit: 2012-05-19 | Discharge: 2012-05-19 | Disposition: A | Discharge status: Home / Self Care | Payer: Medicaid Other | Attending: Neurology | Admitting: Neurology

## 2012-05-19 DIAGNOSIS — E872 Acidosis, unspecified: Secondary | ICD-10-CM | POA: Diagnosis present

## 2012-05-19 DIAGNOSIS — Z7982 Long term (current) use of aspirin: Secondary | ICD-10-CM

## 2012-05-19 DIAGNOSIS — G40909 Epilepsy, unspecified, not intractable, without status epilepticus: Secondary | ICD-10-CM | POA: Diagnosis present

## 2012-05-19 DIAGNOSIS — R778 Other specified abnormalities of plasma proteins: Secondary | ICD-10-CM

## 2012-05-19 DIAGNOSIS — I639 Cerebral infarction, unspecified: Secondary | ICD-10-CM

## 2012-05-19 DIAGNOSIS — Z8673 Personal history of transient ischemic attack (TIA), and cerebral infarction without residual deficits: Secondary | ICD-10-CM

## 2012-05-19 DIAGNOSIS — I10 Essential (primary) hypertension: Secondary | ICD-10-CM

## 2012-05-19 DIAGNOSIS — E876 Hypokalemia: Secondary | ICD-10-CM | POA: Diagnosis present

## 2012-05-19 DIAGNOSIS — I214 Non-ST elevation (NSTEMI) myocardial infarction: Secondary | ICD-10-CM

## 2012-05-19 DIAGNOSIS — Z79899 Other long term (current) drug therapy: Secondary | ICD-10-CM

## 2012-05-19 DIAGNOSIS — Z72820 Sleep deprivation: Secondary | ICD-10-CM

## 2012-05-19 DIAGNOSIS — I69959 Hemiplegia and hemiparesis following unspecified cerebrovascular disease affecting unspecified side: Secondary | ICD-10-CM

## 2012-05-19 DIAGNOSIS — G2581 Restless legs syndrome: Secondary | ICD-10-CM | POA: Diagnosis present

## 2012-05-19 DIAGNOSIS — Z87891 Personal history of nicotine dependence: Secondary | ICD-10-CM

## 2012-05-19 DIAGNOSIS — F329 Major depressive disorder, single episode, unspecified: Secondary | ICD-10-CM | POA: Diagnosis present

## 2012-05-19 DIAGNOSIS — F3289 Other specified depressive episodes: Secondary | ICD-10-CM | POA: Diagnosis present

## 2012-05-19 DIAGNOSIS — R7989 Other specified abnormal findings of blood chemistry: Secondary | ICD-10-CM

## 2012-05-19 DIAGNOSIS — R569 Unspecified convulsions: Secondary | ICD-10-CM

## 2012-05-19 HISTORY — DX: Unspecified convulsions: R56.9

## 2012-05-19 LAB — BASIC METABOLIC PANEL
BUN: 9 mg/dL (ref 6–23)
CO2: 20 mEq/L (ref 19–32)
CO2: 28 mEq/L (ref 19–32)
Calcium: 9.5 mg/dL (ref 8.4–10.5)
Chloride: 101 mEq/L (ref 96–112)
Creatinine, Ser: 0.61 mg/dL (ref 0.50–1.10)
Creatinine, Ser: 0.65 mg/dL (ref 0.50–1.10)
GFR calc non Af Amer: >90 mL/min (ref >90)
Glucose, Bld: 221 mg/dL — ABNORMAL HIGH (ref 70–99)

## 2012-05-19 LAB — CK TOTAL AND CKMB (NOT AT ARMC): Relative Index: 3.5 — ABNORMAL HIGH (ref 0.0–2.5)

## 2012-05-19 LAB — LIPID PANEL
Cholesterol: 152 mg/dL (ref 0–200)
HDL: 50 mg/dL (ref >39)
Triglycerides: 95 mg/dL (ref <150)

## 2012-05-19 LAB — CBC
MCH: 33.5 pg (ref 26.0–34.0)
MCHC: 35.6 g/dL (ref 30.0–36.0)
MCV: 94 fL (ref 78.0–100.0)
Platelets: 202 10*3/uL (ref 150–400)
RDW: 12.7 % (ref 11.5–15.5)

## 2012-05-19 LAB — MAGNESIUM: Magnesium: 1.6 mg/dL (ref 1.5–2.5)

## 2012-05-19 LAB — LACTIC ACID, PLASMA: Lactic Acid, Venous: 1.2 mmol/L (ref 0.5–2.2)

## 2012-05-19 LAB — URINALYSIS, ROUTINE W REFLEX MICROSCOPIC
Hgb urine dipstick: NEGATIVE
Leukocytes, UA: NEGATIVE
Specific Gravity, Urine: 1.017 (ref 1.005–1.030)
Urobilinogen, UA: 0.2 mg/dL (ref 0.0–1.0)

## 2012-05-19 LAB — COMPREHENSIVE METABOLIC PANEL
ALT: 15 U/L (ref 0–35)
AST: 27 U/L (ref 0–37)
Albumin: 3.6 g/dL (ref 3.5–5.2)
CO2: 28 mEq/L (ref 19–32)
Calcium: 8.9 mg/dL (ref 8.4–10.5)
GFR calc non Af Amer: >90 mL/min (ref >90)
Sodium: 137 mEq/L (ref 135–145)
Total Protein: 7 g/dL (ref 6.0–8.3)

## 2012-05-19 LAB — POCT I-STAT TROPONIN I

## 2012-05-19 LAB — TROPONIN I: Troponin I: 1.18 ng/mL (ref <0.30)

## 2012-05-19 MED ORDER — ATORVASTATIN CALCIUM 80 MG PO TABS
80.0000 mg | ORAL_TABLET | Freq: Every day | ORAL | Status: DC
  Administered 2012-05-20: 80 mg via ORAL
  Filled 2012-05-19 (×3): qty 1

## 2012-05-19 MED ORDER — SODIUM CHLORIDE 0.9 % IV SOLN
INTRAVENOUS | Status: DC
  Administered 2012-05-19: 16:00:00 via INTRAVENOUS
  Administered 2012-05-20: 1000 mL via INTRAVENOUS

## 2012-05-19 MED ORDER — SODIUM CHLORIDE 0.9 % IV SOLN
1000.0000 mg | Freq: Once | INTRAVENOUS | Status: AC
  Administered 2012-05-19: 1000 mg via INTRAVENOUS
  Filled 2012-05-19: qty 10

## 2012-05-19 MED ORDER — POTASSIUM CHLORIDE 10 MEQ/100ML IV SOLN
10.0000 meq | INTRAVENOUS | Status: AC
  Administered 2012-05-19 – 2012-05-20 (×4): 10 meq via INTRAVENOUS
  Filled 2012-05-19 (×4): qty 100

## 2012-05-19 MED ORDER — POTASSIUM CHLORIDE 10 MEQ/100ML IV SOLN
10.0000 meq | INTRAVENOUS | Status: AC
  Administered 2012-05-19 (×2): 10 meq via INTRAVENOUS
  Filled 2012-05-19 (×2): qty 100

## 2012-05-19 MED ORDER — ASPIRIN EC 325 MG PO TBEC
325.0000 mg | DELAYED_RELEASE_TABLET | Freq: Every day | ORAL | Status: DC
  Administered 2012-05-20 – 2012-05-21 (×2): 325 mg via ORAL
  Filled 2012-05-19 (×2): qty 1

## 2012-05-19 MED ORDER — ENOXAPARIN SODIUM 40 MG/0.4ML ~~LOC~~ SOLN
40.0000 mg | SUBCUTANEOUS | Status: DC
  Filled 2012-05-19: qty 0.4

## 2012-05-19 MED ORDER — ACETAMINOPHEN 650 MG RE SUPP
650.0000 mg | Freq: Four times a day (QID) | RECTAL | Status: DC | PRN
  Administered 2012-05-19: 650 mg via RECTAL
  Filled 2012-05-19: qty 1

## 2012-05-19 MED ORDER — SODIUM CHLORIDE 0.9 % IV SOLN
Freq: Once | INTRAVENOUS | Status: AC
  Administered 2012-05-19: 09:00:00 via INTRAVENOUS

## 2012-05-19 MED ORDER — POTASSIUM CHLORIDE CRYS ER 20 MEQ PO TBCR
40.0000 meq | EXTENDED_RELEASE_TABLET | Freq: Once | ORAL | Status: AC
  Administered 2012-05-19: 40 meq via ORAL
  Filled 2012-05-19: qty 2

## 2012-05-19 MED ORDER — SERTRALINE HCL 50 MG PO TABS
50.0000 mg | ORAL_TABLET | Freq: Every day | ORAL | Status: DC
  Administered 2012-05-20 – 2012-05-21 (×2): 50 mg via ORAL
  Filled 2012-05-19 (×3): qty 1

## 2012-05-19 MED ORDER — ONDANSETRON HCL 4 MG PO TABS: 4.0000 mg | ORAL_TABLET | Freq: Four times a day (QID) | ORAL | Status: DC | PRN

## 2012-05-19 MED ORDER — LEVETIRACETAM 500 MG PO TABS
500.0000 mg | ORAL_TABLET | Freq: Two times a day (BID) | ORAL | Status: DC
  Administered 2012-05-19 – 2012-05-21 (×4): 500 mg via ORAL
  Filled 2012-05-19 (×6): qty 1

## 2012-05-19 MED ORDER — STUDY - INVESTIGATIONAL DRUG SIMPLE RECORD: 75.0000 mg | Freq: Every day | Status: DC

## 2012-05-19 MED ORDER — HEPARIN (PORCINE) IN NACL 100-0.45 UNIT/ML-% IJ SOLN
1100.0000 [IU]/h | INTRAMUSCULAR | Status: DC
  Administered 2012-05-19: 800 [IU]/h via INTRAVENOUS
  Filled 2012-05-19 (×2): qty 250

## 2012-05-19 MED ORDER — HEPARIN BOLUS VIA INFUSION
2000.0000 [IU] | Freq: Once | INTRAVENOUS | Status: AC
  Administered 2012-05-19: 2000 [IU] via INTRAVENOUS
  Filled 2012-05-19: qty 2000

## 2012-05-19 MED ORDER — HEPARIN BOLUS VIA INFUSION
4000.0000 [IU] | Freq: Once | INTRAVENOUS | Status: AC
  Administered 2012-05-19: 4000 [IU] via INTRAVENOUS
  Filled 2012-05-19: qty 4000

## 2012-05-19 MED ORDER — ACETAMINOPHEN 325 MG PO TABS
650.0000 mg | ORAL_TABLET | Freq: Four times a day (QID) | ORAL | Status: DC | PRN
  Administered 2012-05-21: 650 mg via ORAL
  Filled 2012-05-19: qty 2

## 2012-05-19 MED ORDER — LORAZEPAM 2 MG/ML IJ SOLN: 1.0000 mg | Freq: Once | INTRAMUSCULAR | Status: DC

## 2012-05-19 MED ORDER — POTASSIUM CHLORIDE 10 MEQ/100ML IV SOLN
10.0000 meq | Freq: Once | INTRAVENOUS | Status: AC
  Administered 2012-05-19: 10 meq via INTRAVENOUS
  Filled 2012-05-19: qty 100

## 2012-05-19 MED ORDER — SODIUM CHLORIDE 0.9 % IJ SOLN
3.0000 mL | Freq: Two times a day (BID) | INTRAMUSCULAR | Status: DC
  Administered 2012-05-19 – 2012-05-20 (×3): 3 mL via INTRAVENOUS

## 2012-05-19 MED ORDER — LORAZEPAM 2 MG/ML IJ SOLN
INTRAMUSCULAR | Status: AC
  Administered 2012-05-19: 07:00:00
  Filled 2012-05-19: qty 1

## 2012-05-19 MED ORDER — ONDANSETRON HCL 4 MG/2ML IJ SOLN: 4.0000 mg | Freq: Four times a day (QID) | INTRAMUSCULAR | Status: DC | PRN

## 2012-05-19 MED ORDER — POTASSIUM CHLORIDE 10 MEQ/100ML IV SOLN
10.0000 meq | INTRAVENOUS | Status: DC
  Filled 2012-05-19 (×3): qty 100

## 2012-05-19 NOTE — ED Notes (Signed)
Bed control called per request by family to see if pt could had bed request placed for 3000

## 2012-05-19 NOTE — Progress Notes (Signed)
EEG completed.

## 2012-05-19 NOTE — Consult Note (Signed)
CARDIOLOGY CONSULT NOTE   Patient ID: Anna Lyons MRN: [id number] DOB/AGE: 08-11-1955 57 y.o.  Admit date: 05/19/2012  Primary Physician   Kaleen Mask, MD Primary Cardiologist   SM consult, PN cathed 2013 Reason for Consultation   Elevated troponin  Anna Lyons is a 57 y.o. female with no history of CAD.  She has a history of chest pain and had a cardiac catheterization that was clean and 2013. She has not had any recent chest pain. Her husband was recently admitted to the hospital after a large CVA. He was having seizures. Anna Lyons was staying with him last night. Her daughter spoke with Mrs. then about 9:00. She was tired but otherwise in her usual state of health. This a.m., she was found incontinent of stool and urine. She was confused and brought to the emergency room. In the emergency room, she had a generalized tonic-clonic seizure. It lasted about 1 minute. She has been admitted by internal medicine and neurology is consulted.   Past Medical History  Diagnosis Date  . Depression   . Hypertension   . Angina   . Restless leg syndrome   . TIA (transient ischemic attack) 08/2010  . Stroke 04/20/2011    a. 04/20/11 Left subcortical infarct treated w/ TPA;  b. 04/2011 Echo: EF 55-60%;  c. 04/2011 Normal Carotid u/s  d. Residual "walk w/limp on right; unable to grasp w/right hand"  . Tobacco abuse     a. quit @ time of CVA 04/2011.  [name] RLS (restless legs syndrome) 11/16/2011  . Seizures      Past Surgical History  Procedure Laterality Date  . Mastectomy  1980's    bilateral with reconstruction  . Breast reconstruction      bilaterally  . Cesarean section  1979  . Knee arthroscopy  1990's    left; cartilage repair  . Carpal tunnel release  1990's    left    Allergies  Allergen Reactions  . Codeine Itching    I have reviewed the patient's current medications . levETIRAcetam  500 mg Oral BID  . LORazepam  1 mg Intravenous Once     acetaminophen,  acetaminophen  Prior to Admission medications   Medication Sig Start Date End Date Taking? Authorizing Provider  aspirin EC 325 MG tablet Take 325 mg by mouth daily.   Yes Historical Provider, MD  hydrochlorothiazide (HYDRODIURIL) 25 MG tablet Take 25 mg by mouth daily.   Yes Historical Provider, MD  ibuprofen (ADVIL,MOTRIN) 200 MG tablet Take 400 mg by mouth every 6 (six) hours as needed for pain or headache.   Yes Historical Provider, MD  pantoprazole (PROTONIX) 40 MG tablet Take 40 mg by mouth daily as needed. indigestion   Yes Historical Provider, MD  rOPINIRole (REQUIP) 0.5 MG tablet Take 0.5 mg by mouth at bedtime.   Yes Historical Provider, MD  sertraline (ZOLOFT) 100 MG tablet Take 100 mg by mouth daily.   Yes Historical Provider, MD  traMADol (ULTRAM) 50 MG tablet Take 50 mg by mouth every 6 (six) hours as needed for pain.   Yes Historical Provider, MD     History   Social History  . Marital Status: Married    Spouse Name: N/A    Number of Children: N/A  . Years of Education: N/A   Occupational History  . Retired   Social History Main Topics  . Smoking status: Former Smoker -- 0.50 packs/day for 37 years    Types: Cigarettes  Quit date: 04/20/2011  . Smokeless tobacco: Never Used     Comment: quit 04/20/2011.  [name] Alcohol Use: No  . Drug Use: No  . Sexually Active: No   Other Topics Concern  . Not on file   Social History Narrative   Lives @ home locally with husband.    Family Status  Relation Status Death Age  . Mother Deceased   . Father Deceased    Family History  Problem Relation Age of Onset  . Lupus Mother     died early 52's.  . Emphysema Father     died late 9's.  [name] COPD Father      ROS:  Full 14 point review of systems complete and found to be negative unless listed above.  Physical Exam: Blood pressure 111/78, pulse 83, temperature 100.9 F (38.3 C), temperature source Tympanic, resp. rate 13, height 5' 4.96" (1.65 m), weight 144 lb 10 oz  (65.6 kg), SpO2 99.00%.  General: Well developed, well nourished, female, sedated but rouses to verbal. Head: Eyes PERRLA, No xanthomas.   Normocephalic and atraumatic, oropharynx without edema or exudate. Dentition: poor Lungs: CTA bilat Heart: HRRR S1 S2, no rub/gallop, soft systolic murmur. pulses are 2+ extrem.   Neck: No carotid bruits. No lymphadenopathy.  JVD not elevated. Abdomen: Bowel sounds present, abdomen soft and non-tender without masses or hernias noted. Msk:  No spine or cva tenderness. No weakness, no joint deformities or effusions. Extremities: No clubbing or cyanosis. No edema.  Neuro: Alert and oriented X 1. No focal deficits noted. Psych:  Sleeps unless roused, but responds appropriately Skin: No rashes or lesions noted.  Labs:   Lab Results  Component Value Date   WBC 10.5 05/19/2012   HGB 14.4 05/19/2012   HCT 40.4 05/19/2012   MCV 94.0 05/19/2012   PLT 202 05/19/2012     Recent Labs Lab 05/19/12 1235  NA 137  K 3.1*  CL 100  CO2 28  BUN 9  CREATININE 0.60  CALCIUM 8.9  PROT 7.0  BILITOT 0.5  ALKPHOS 62  ALT 15  AST 27  GLUCOSE 121*   Magnesium  Date Value Range Status  05/19/2012 1.6  1.5 - 2.5 mg/dL Final    Recent Labs  45/40/98 0707 05/19/12 1235  CKTOTAL  --  125  CKMB  --  4.4*  TROPONINI 0.73*  --    Lab Results  Component Value Date   CHOL 152 05/19/2012   HDL 50 05/19/2012   LDLCALC 83 05/19/2012   TRIG 95 05/19/2012    Echo: 04/21/2011 - Left ventricle: The cavity size was normal. Systolic function was normal. The estimated ejection fraction was in the range of 55% to 60%. Wall motion was normal; there were no regional wall motion abnormalities. - Atrial septum: No defect or patent foramen ovale was identified.  Cardiac Cath:  05/20/2011 LM: Normal short segment really side by side ostium  LAD: normal  D1: Normal  Circumflex: Left dominant and normal  OM1: normal small  OM2: normal small  OM3: normal small  Left  sided PDA/PLB normal  RCA: nondominant and normal  Ventriculography: EF: 65 %, no RWMA;s  Hemodynamics:  Aortic Pressure: 116/67 mmHg LV Pressure: 119/4 mmHg  Impression: No significant CAD. Tolerated procedure well. Plan per primary care  ECG:  SR, 99 with 1 degree AV block  Radiology:  Ct Head Wo Contrast   (if New Onset Seizure And/or Head Trauma) 05/19/2012  *RADIOLOGY REPORT*  Clinical  Data: Seizure  CT HEAD WITHOUT CONTRAST  Technique:  Contiguous axial images were obtained from the base of the skull through the vertex without contrast.  Comparison: 11/16/2011  Findings: Dilated perivascular space within the right basal ganglia is unchanged from previous exam.  Old left basal ganglia infarct is similar to previous exam.  There is mild patchy low attenuation within the subcortical white matter identified bilaterally suggestive of chronic small vessel ischemic change.  The sulci and ventricular volumes appear within normal limits.  The midline is maintained. There is no evidence for acute brain infarct, hemorrhage or mass. The mastoid air cells and paranasal sinuses appear clear.  The skull appears intact.  IMPRESSION:  1.  No acute intracranial abnormalities. 2.  Small vessel ischemic change. 3.  Chronic left basal ganglia lacunar infarcts.   Original Report Authenticated By: Signa Kell, M.D.    Dg Chest Portable 1 View 05/19/2012  *RADIOLOGY REPORT*  Clinical Data: Seizures  PORTABLE CHEST - 1 VIEW  Comparison: 11/16/2011  Findings: Cardiomediastinal silhouette is stable.  No acute infiltrate or pleural effusion.  No pulmonary edema.  Mild left basilar atelectasis.  IMPRESSION: No active disease.  Mild left basilar atelectasis.   Original Report Authenticated By: Natasha Mead, M.D.     ASSESSMENT AND PLAN:   The patient was seen today by Dr Shirlee Latch, the patient evaluated and the data reviewed.  Principal Problem:   Seizure Active Problems:   History of stroke   Essential hypertension,  benign   NSTEMI, initial episode of care; Type 2   Signed: Theodore Demark, PA-C 05/19/2012 2:00 PM Beeper [phone]  Co-Sign MD  Patient seen with PA, agree with the above note.  1. Elevated troponin: No chest pain.  Mild troponin elevation.  ECG is normal.  It is possible that she had a sympathetic nervous system "surge" in the setting of the seizures leading to elevated troponin (similar to Takotsubo cardiomyopathy physiology).  She had a negative cath in 4/13.  - cycle cardiac enzymes to peak. - Will get echocardiogram - Treat with aspirin and statin - If no new CVA on MRI, would treat with heparin gtt until we confirm that enzymes are not significantly up-trending.  - If flat troponin curve and no new symptoms, would likely do stress test as outpatient.  2. Seizures: ? From focus of prior CVA.  Per neuro.   Marca Ancona 05/19/2012 2:06 PM

## 2012-05-19 NOTE — ED Notes (Signed)
Dr Kem Kays at bedside

## 2012-05-19 NOTE — ED Provider Notes (Signed)
History     CSN: [id number]  Arrival date & time 05/19/12  4098   First MD Initiated Contact with Patient 05/19/12 [phone]      Chief Complaint  Patient presents with  . Seizures    HPI  Pt was visiting husband in hospital and was found in adjacent room incontinent of stool. Cot in pt's husbands room was found to be incontinent of urine. Pt was very confused and brought to ED. One in ED room pt had seizure like activity that lasted approximately one minute.  Patient had received IV Ativan prior to my exam.    Past Medical History  Diagnosis Date  . Depression   . Hypertension   . Angina   . Restless leg syndrome   . TIA (transient ischemic attack) 08/2010  . Stroke 04/20/2011    a. 04/20/11 Left subcortical infarct treated w/ TPA;  b. 04/2011 Echo: EF 55-60%;  c. 04/2011 Normal Carotid u/s  d. Residual "walk w/limp on right; unable to grasp w/right hand"  . Tobacco abuse     a. quit @ time of CVA 04/2011.  [name] RLS (restless legs syndrome) 11/16/2011  . Seizures     Past Surgical History  Procedure Laterality Date  . Mastectomy  1980's    bilateral with reconstruction  . Breast reconstruction      bilaterally  . Cesarean section  1979  . Knee arthroscopy  1990's    left; cartilage repair  . Carpal tunnel release  1990's    left    Family History  Problem Relation Age of Onset  . Lupus Mother     died early 43's.  . Emphysema Father     died late 42's.  [name] COPD Father     History  Substance Use Topics  . Smoking status: Former Smoker -- 0.50 packs/day for 37 years    Types: Cigarettes    Quit date: 04/20/2011  . Smokeless tobacco: Never Used     Comment: quit 04/20/2011.  [name] Alcohol Use: No    OB History   Grav Para Term Preterm Abortions TAB SAB Ect Mult Living                  Review of Systems  Unable to perform ROS: Acuity of condition    Allergies  Codeine  Home Medications   No current outpatient prescriptions on file.  BP 107/68  Pulse 83   Temp(Src) 99.8 F (37.7 C) (Oral)  Resp 16  Ht 5' 4.96" (1.65 m)  Wt 144 lb 10 oz (65.6 kg)  BMI 24.1 kg/m2  SpO2 95%  Physical Exam  Nursing note and vitals reviewed. Constitutional: She appears well-developed and well-nourished. She appears distressed (Appears to be in post ictal state). She is sedated.  HENT:  Head: Normocephalic and atraumatic.  Eyes: Pupils are equal, round, and reactive to light.  Neck: Normal range of motion.  Cardiovascular: Normal rate and intact distal pulses.   Pulmonary/Chest: No respiratory distress.  Abdominal: Normal appearance. She exhibits no distension. There is no tenderness. There is no rebound.  Musculoskeletal: Normal range of motion.  Neurological: GCS eye subscore is 3. GCS verbal subscore is 2. GCS motor subscore is 5.  Skin: Skin is warm and dry. No rash noted.  Psychiatric: She has a normal mood and affect. Her behavior is normal.    ED Course  Procedures (including critical care time)  Date: 05/19/2012  Rate: 99  Rhythm: normal sinus rhythm  QRS  Axis:left axis deviation  Intervals: normal  ST/T Wave abnormalities: normal  Conduction Disutrbances: none  Narrative Interpretation:wondering baseline associated with artifact  Medications  LORazepam (ATIVAN) injection 1 mg (not administered)  potassium chloride 10 mEq in 100 mL IVPB (10 mEq Intravenous New Bag/Given 05/19/12 1034)  levETIRAcetam (KEPPRA) tablet 500 mg (not administered)  aspirin EC tablet 325 mg (not administered)  sertraline (ZOLOFT) tablet 50 mg (50 mg Oral Not Given 05/19/12 1500)  sodium chloride 0.9 % injection 3 mL (3 mLs Intravenous Given 05/19/12 1622)  0.9 %  sodium chloride infusion ( Intravenous New Bag/Given 05/19/12 1622)  acetaminophen (TYLENOL) tablet 650 mg ( Oral See Alternative 05/19/12 1231)    Or  acetaminophen (TYLENOL) suppository 650 mg (650 mg Rectal Given 05/19/12 1231)  ondansetron (ZOFRAN) tablet 4 mg (not administered)    Or  ondansetron  (ZOFRAN) injection 4 mg (not administered)  heparin ADULT infusion 100 units/mL (25000 units/250 mL) (800 Units/hr Intravenous New Bag/Given 05/19/12 1617)  atorvastatin (LIPITOR) tablet 80 mg (not administered)  potassium chloride 10 mEq in 100 mL IVPB (not administered)  LORazepam (ATIVAN) 2 MG/ML injection (  Given 05/19/12 0709)  0.9 %  sodium chloride infusion ( Intravenous New Bag/Given 05/19/12 0858)  levETIRAcetam (KEPPRA) 1,000 mg in sodium chloride 0.9 % 100 mL IVPB (0 mg Intravenous Stopped 05/19/12 1041)  heparin bolus via infusion 4,000 Units (4,000 Units Intravenous Given 05/19/12 1617)  potassium chloride 10 mEq in 100 mL IVPB (10 mEq Intravenous Given 05/19/12 1712)       Labs Reviewed  BASIC METABOLIC PANEL - Abnormal; Notable for the following:    Potassium 2.8 (*)    Glucose, Bld 221 (*)    All other components within normal limits  TROPONIN I - Abnormal; Notable for the following:    Troponin I 0.73 (*)    All other components within normal limits  GLUCOSE, CAPILLARY - Abnormal; Notable for the following:    Glucose-Capillary 163 (*)    All other components within normal limits  URINALYSIS, ROUTINE W REFLEX MICROSCOPIC - Abnormal; Notable for the following:    APPearance HAZY (*)    Ketones, ur 15 (*)    All other components within normal limits  COMPREHENSIVE METABOLIC PANEL - Abnormal; Notable for the following:    Potassium 3.1 (*)    Glucose, Bld 121 (*)    All other components within normal limits  CK TOTAL AND CKMB - Abnormal; Notable for the following:    CK, MB 4.4 (*)    Relative Index 3.5 (*)    All other components within normal limits  TROPONIN I - Abnormal; Notable for the following:    Troponin I 1.18 (*)    All other components within normal limits  POCT I-STAT TROPONIN I - Abnormal; Notable for the following:    Troponin i, poc 0.88 (*)    All other components within normal limits  CBC  MAGNESIUM  LACTIC ACID, PLASMA  LIPID PANEL  TROPONIN  I  TSH  HEPARIN LEVEL (UNFRACTIONATED)  BASIC METABOLIC PANEL  CBC  HEPARIN LEVEL (UNFRACTIONATED)  BASIC METABOLIC PANEL   Ct Head Wo Contrast   (if New Onset Seizure And/or Head Trauma)  05/19/2012  *RADIOLOGY REPORT*  Clinical Data: Seizure  CT HEAD WITHOUT CONTRAST  Technique:  Contiguous axial images were obtained from the base of the skull through the vertex without contrast.  Comparison: 11/16/2011  Findings: Dilated perivascular space within the right basal ganglia is unchanged from  previous exam.  Old left basal ganglia infarct is similar to previous exam.  There is mild patchy low attenuation within the subcortical white matter identified bilaterally suggestive of chronic small vessel ischemic change.  The sulci and ventricular volumes appear within normal limits.  The midline is maintained. There is no evidence for acute brain infarct, hemorrhage or mass. The mastoid air cells and paranasal sinuses appear clear.  The skull appears intact.  IMPRESSION:  1.  No acute intracranial abnormalities. 2.  Small vessel ischemic change. 3.  Chronic left basal ganglia lacunar infarcts.   Original Report Authenticated By: Signa Kell, M.D.    Mr Brain Wo Contrast  05/19/2012  *RADIOLOGY REPORT*  Clinical Data: 57 year old female with right head and night deviation followed by tonic clonic seizure activity.  No prior seizure history.  Found down. History of stroke.  MRI HEAD WITHOUT CONTRAST  Technique:  Multiplanar, multiecho pulse sequences of the brain and surrounding structures were obtained according to standard protocol without intravenous contrast.  Comparison: Head CT 05/19/2012.  Brain MRI 11/16/2011 and earlier.  Findings: Stable cerebral volume. No restricted diffusion to suggest acute infarction.  No midline shift, mass effect, evidence of mass lesion, ventriculomegaly, extra-axial collection or acute intracranial hemorrhage.  Cervicomedullary junction and pituitary are within normal limits.   Major intracranial vascular flow voids are stable.  Chronic left corona radiata/external capsule lacunar infarct again noted.  Additional bilateral basal ganglia dilated perivascular spaces or other chronic lacunae.  Wallerian degeneration on the left.  No definite chronic blood products. Stable gray and white matter signal throughout the brain . No definite asymmetric signal in the hippocampal complexes; thin coronal images somewhat degraded by noise.  Negative visualized cervical spine.  Normal bone marrow signal. Visualized orbit soft tissues are within normal limits.  Stable paranasal sinuses and mastoids; small right mastoid effusion. Stable and negative visualized nasopharynx.  Grossly normal visualized internal auditory structures.  Negative scalp soft tissues.  IMPRESSION: Stable noncontrast MRI appearance of the brain with chronic small vessel ischemia greater on the left. No acute intracranial abnormality.   Original Report Authenticated By: Erskine Speed, M.D.    Dg Chest Portable 1 View  05/19/2012  *RADIOLOGY REPORT*  Clinical Data: Seizures  PORTABLE CHEST - 1 VIEW  Comparison: 11/16/2011  Findings: Cardiomediastinal silhouette is stable.  No acute infiltrate or pleural effusion.  No pulmonary edema.  Mild left basilar atelectasis.  IMPRESSION: No active disease.  Mild left basilar atelectasis.   Original Report Authenticated By: Natasha Mead, M.D.      1. CVA (cerebral infarction)   2. Seizure   3. New onset seizure   4. Elevated troponin   5. NSTEMI (non-ST elevated myocardial infarction)       MDM   Review records finds patient had cardiac catheter done in 2013 which showed no significant coronary artery disease.  CRITICAL CARE Performed by: Nelia Shi   Total critical care time: 30 min  Critical care time was exclusive of separately billable procedures and treating other patients.  Critical care was necessary to treat or prevent imminent or life-threatening  deterioration.  Critical care was time spent personally by me on the following activities: development of treatment plan with patient and/or surrogate as well as nursing, discussions with consultants, evaluation of patient's response to treatment, examination of patient, obtaining history from patient or surrogate, ordering and performing treatments and interventions, ordering and review of laboratory studies, ordering and review of radiographic studies, pulse oximetry and re-evaluation  of patient's condition.      Nelia Shi, MD 05/19/12 [phone]

## 2012-05-19 NOTE — ED Notes (Signed)
MRI called and notified that pt is ready for transport to MRI and that pt has a bed on floor and would like to get MRI done before transfer to floor.

## 2012-05-19 NOTE — H&P (Signed)
Hospital Admission Note Date: 05/19/2012  Patient name: Anna Lyons Medical record number: [id number] Date of birth: 1955-12-31 Age: 57 y.o. Gender: female PCP: Kaleen Mask, MD  Medical Service: [facility]  Attending physician: Dr. Kem Kays   1st Contact: Dr. Heloise Beecham   Pager:[phone] 2nd Contact: Dr. Manson Passey   Pager:[phone] After 5 pm or weekends: 1st Contact:  Intern on call   Pager: [phone] 2nd Contact:  Resident on call  Pager: [phone]  Chief Complaint: Seizure  History of Present Illness: Anna Lyons is a 57 year old female with past medical history of hypertension, CVA with residual right-sided hemiparesis, multiple TIAs, history of tobacco abuse presenting after witnessed seizure. The patient's husband and is currently hospitalized the [facility] [place]. She was found down near his room noted to be incontinent of stool and urine and shaking. She was brought to the ED and was noted to have right head and eye deviation followed by tonic-clonic seizure activity.  In the ED, she was given Ativan and Versed and seizure activity stopped. She is somnolent with sedation on our evaluation but reports that she cannot remember what happened prior to coming to the emergency room. Her daughter is in the room and reports that her mother has not had a prior seizure that she is aware of. According to family, the patient has been without sleep for over 24 hours while attending to her husband in the hospital. In terms of other precipitants of possible seizure, patient is unable to engage in history taking to provide. Daughter reports that her mother does drink wine, and she's not sure, that. She Patient also noted to have elevated troponin of 0.79. No history of heart disease per the daughter. From record review, it appears that she had a clean cath one year ago.  Meds: Current Outpatient Rx  Name  Route  Sig  Dispense  Refill  . aspirin EC 325 MG tablet   Oral   Take 325 mg by mouth daily.          . hydrochlorothiazide (HYDRODIURIL) 25 MG tablet   Oral   Take 25 mg by mouth daily.         . INVESTIGATIONAL DRUG SIMPLE RECORD   Oral   Take 75 mg by mouth daily with breakfast. Point Trial - clopidogrel / placebo (daily dose)  (PI-Sethi)         . pantoprazole (PROTONIX) 40 MG tablet   Oral   Take 40 mg by mouth daily as needed. indigestion         . potassium chloride (K-DUR) 10 MEQ tablet   Oral   Take 10 mEq by mouth 2 (two) times daily.         [name] rOPINIRole (REQUIP) 0.5 MG tablet   Oral   Take 0.5 mg by mouth at bedtime.         . sertraline (ZOLOFT) 50 MG tablet   Oral   Take 50 mg by mouth daily.         . traMADol (ULTRAM) 50 MG tablet   Oral   Take 50 mg by mouth daily. For pain.           Allergies: Allergies as of 05/19/2012 - Review Complete 05/19/2012  Allergen Reaction Noted  . Codeine Itching 04/20/2011   Past Medical History  Diagnosis Date  . Depression   . Hypertension   . Angina   . Restless leg syndrome   . TIA (transient ischemic attack) 08/2010  . Stroke 04/20/2011  a. 04/20/11 Left subcortical infarct treated w/ TPA;  b. 04/2011 Echo: EF 55-60%;  c. 04/2011 Normal Carotid u/s  d. Residual "walk w/limp on right; unable to grasp w/right hand"  . Tobacco abuse     a. quit @ time of CVA 04/2011.  [name] RLS (restless legs syndrome) 11/16/2011   Past Surgical History  Procedure Laterality Date  . Mastectomy  1980's    bilateral with reconstruction  . Breast reconstruction      bilaterally  . Cesarean section  1979  . Knee arthroscopy  1990's    left; cartilage repair  . Carpal tunnel release  1990's    left   Family History  Problem Relation Age of Onset  . Lupus Mother     died early 28's.  . Emphysema Father     died late 6's.  [name] COPD Father    History   Social History  . Marital Status: Married    Spouse Name: N/A    Number of Children: N/A  . Years of Education: N/A   Occupational History  . Not on file.    Social History Main Topics  . Smoking status: Former Smoker -- 0.50 packs/day for 37 years    Types: Cigarettes    Quit date: 04/20/2011  . Smokeless tobacco: Never Used     Comment: quit 04/20/2011.  [name] Alcohol Use: No  . Drug Use: No  . Sexually Active: No   Other Topics Concern  . Not on file   Social History Narrative   Lives @ home locally with husband.    Review of Systems: Unable to perform complete ROS 2/2 patient sedation.   Physical Exam: Blood pressure 133/104, pulse 79, temperature 100.9 F (38.3 C), temperature source Tympanic, resp. rate 16, height 5' 4.96" (1.65 m), weight 144 lb 10 oz (65.6 kg), SpO2 100.00%. Vitals reviewed. General: Patient is heavily sedated after Ativan and Versed. She's lying in bed breathing comfortably. She will arouse to answer simple questions and follow simple commands, but quickly falls back asleep. HEENT: PERRL, EOMI, no scleral icterus Cardiac: RRR, no rubs, murmurs or gallops Pulm: clear to auscultation bilaterally, no wheezes, rales, or rhonchi Abd: soft, nontender, nondistended, BS present Ext: warm and well perfused, no pedal edema Neuro:  Mental status: Somnolent and falling asleep after receiving Versed and Ativan. Able to follow simple commands and answer simple questions. Cranial nerves: 2 through 12 appear intact Motor: Patient unable to fully participate in exam. Spontaneously moves all 4 extremities Reflexes: 2+ left patellar and biceps, 2+ right biceps and 3+ right patellar Cerebellar: Too somnolent to participate in finger-nose-finger. Gait: Not assessed.   Lab results: Basic Metabolic Panel:  Recent Labs  78/46/96 0707  NA 138  K 2.8*  CL 96  CO2 20  GLUCOSE 221*  BUN 9  CREATININE 0.61  CALCIUM 9.5  MG 1.6   CBC:  Recent Labs  05/19/12 0707  WBC 10.5  HGB 14.4  HCT 40.4  MCV 94.0  PLT 202   Cardiac Enzymes:  Recent Labs  05/19/12 0707  TROPONINI 0.73*   CBG:  Recent Labs   05/19/12 0751  GLUCAP 163*    Drugs of Abuse     Component Value Date/Time   LABOPIA NONE DETECTED 11/17/2011 1459   COCAINSCRNUR NONE DETECTED 11/17/2011 1459   LABBENZ NONE DETECTED 11/17/2011 1459   AMPHETMU NONE DETECTED 11/17/2011 1459   THCU NONE DETECTED 11/17/2011 1459   LABBARB NONE DETECTED 11/17/2011 1459  Urinalysis:  Recent Labs  05/19/12 0920  COLORURINE YELLOW  LABSPEC 1.017  PHURINE 6.0  GLUCOSEU NEGATIVE  HGBUR NEGATIVE  BILIRUBINUR NEGATIVE  KETONESUR 15*  PROTEINUR NEGATIVE  UROBILINOGEN 0.2  NITRITE NEGATIVE  LEUKOCYTESUR NEGATIVE   Imaging results:  Ct Head Wo Contrast   (if New Onset Seizure And/or Head Trauma)  05/19/2012  *RADIOLOGY REPORT*  Clinical Data: Seizure  CT HEAD WITHOUT CONTRAST  Technique:  Contiguous axial images were obtained from the base of the skull through the vertex without contrast.  Comparison: 11/16/2011  Findings: Dilated perivascular space within the right basal ganglia is unchanged from previous exam.  Old left basal ganglia infarct is similar to previous exam.  There is mild patchy low attenuation within the subcortical white matter identified bilaterally suggestive of chronic small vessel ischemic change.  The sulci and ventricular volumes appear within normal limits.  The midline is maintained. There is no evidence for acute brain infarct, hemorrhage or mass. The mastoid air cells and paranasal sinuses appear clear.  The skull appears intact.  IMPRESSION:  1.  No acute intracranial abnormalities. 2.  Small vessel ischemic change. 3.  Chronic left basal ganglia lacunar infarcts.   Original Report Authenticated By: Signa Kell, M.D.    Dg Chest Portable 1 View  05/19/2012  *RADIOLOGY REPORT*  Clinical Data: Seizures  PORTABLE CHEST - 1 VIEW  Comparison: 11/16/2011  Findings: Cardiomediastinal silhouette is stable.  No acute infiltrate or pleural effusion.  No pulmonary edema.  Mild left basilar atelectasis.  IMPRESSION: No  active disease.  Mild left basilar atelectasis.   Original Report Authenticated By: Natasha Mead, M.D.     ED ECG REPORT   Date: 05/19/2012  EKG Time: 7:16 AM  Rate: 99  Rhythm: normal sinus rhythm  Axis: left axis deviation   Intervals:none  ST&T Change: Baseline wavers. No evidence STEMI or ST segment depression  Narrative Interpretation: EKG with shaky baseline. Appears to be in normal sinus rhythm with no ST segment changes concerning for ischemia or infarct.     Assessment & Plan by Problem: Ms. Boan is a 57 year old woman who presents for admission after being found down incontinent of stool and urine and later witnessed to have secondary generalized tonic-clonic seizure.   #Seizure Found down near her husband hospital room. Noted to be incontinent of stool and urine as well as shaking. Down to the ED where she had what appears to be secondary generalized seizure with initial right gaze deviation and then tonic-clonic activity. In terms of precipitants, she does have ischemic brain disease with prior CVA of left subcortical brain. She was also significantly sleep deprived which may have unmasked an underlying seizure focus from her stroke; however, the consulting neurologist remarked that this would be an unusual seizure focus based on location. Her daughter reports that she drinks wine and is not sure how much she drinks. She is a heavy alcohol user, this could represent a withdrawal seizure. She had low potassium, this would not usually precipitate a seizure. Also noted to have elevated troponin (see problem below), is unclear this point if other troponin is related to seizure activity years true indicator of MI which may have preceded seizure. -Per neurology, MRI and EEG of brain. -Keppra load - Will check CK, complete metabolic panel  #Elevated troponin Noted to have troponin of 0.79. As a history of chest pain the past and a workup with a cardiac catheterization which was negative  for any coronary artery disease one  year ago. Has not had any elevations of troponin noted in the past based on chart review. EKG is wavering baseline but does not show clear evidence of ischemia. She is on aspirin at home. Not on a beta blocker. Cardiology was consult by the emergency department. - Will cycle cardiac enzymes including total CK and MB fraction. - Follow cardiology recommendations -Check a lipid panel  #Hypokalemia Potassium today is 2.8. Has had borderline low potassium noted on prior hospitalizations, as low as 2.8 about a year ago. On diuretic therapy with HCTZ. She was given 3 runs of 10 mEq of potassium in the ED. -Follow metabolic panel, replace as needed  #Anion gap metabolic acidosis Has AG of 22. Delta-delta ratio is about 2.5, indicating possible concomitant mild metabolic alkalosis.  - Will check lactic acid in setting of recent seizure.   #History of stroke History of left subcortical infarct treated with TPA in March of 2013. Residual right-sided hemiparesis. On aspirin and investigational drug per neurology. No new focal neurologic deficits noted on exam. No evidence of new stroke on CT head. -Continue aspirin and investigational drug   #Essential hypertension, benign BP in the ED was 121/73. On hydrochlorothiazide 25 mg daily. -Continue HCTZ  #Restless leg syndrome On ropinirole at home.  -Continue ropinirole  Dispo: Disposition is deferred at this time, awaiting improvement of current medical problems. Anticipated discharge in approximately 2-3 day(s).   The patient does have a current PCP Kaleen Mask, MD), therefore will not be requiring OPC follow-up after discharge.   Signed: Bronson Curb 05/19/2012, 10:42 AM

## 2012-05-19 NOTE — ED Notes (Signed)
MD at bedside. 

## 2012-05-19 NOTE — ED Notes (Signed)
Report given to floor nurse, Elana, RN. Informed that we would be drawing troponin before bringing pt upstairs. Family aware of where pts belongings are. Nurse has no further questions upon report given. Primary nurse will transport pt to floor.

## 2012-05-19 NOTE — Consult Note (Signed)
Reason for Consult: Seizure Referring Physician: Radford Pax, R  CC: Seizure  History is obtained from: Patient, family, nursing staff and referring physician  HPI: Anna Lyons is a 57 y.o. female with a history of previous stroke and presents with 2 episodes concerning for seizure. The first happened on the floor of the hospital that she was visiting her husband. She is staying with him and was incontinent of both stool and urine and had what was reported to be should shaking activity.  She was then taken to the emergency room where she was witnessed to have right head and eye deviation followed by tonic-clonic activity.  She was given Ativan and Versed and has since begun to wake up.  LKW: Last night tpa given: no, seizure onset  ROS: A 14 point ROS was unable to be performed secondary to drowsiness from medication  Past Medical History  Diagnosis Date  . Depression   . Hypertension   . Angina   . Restless leg syndrome   . TIA (transient ischemic attack) 08/2010  . Stroke 04/20/2011    a. 04/20/11 Left subcortical infarct treated w/ TPA;  b. 04/2011 Echo: EF 55-60%;  c. 04/2011 Normal Carotid u/s  d. Residual "walk w/limp on right; unable to grasp w/right hand"  . Tobacco abuse     a. quit @ time of CVA 04/2011.  [name] RLS (restless legs syndrome) 11/16/2011    Family History: No family history of seizures, other than her husband  Social History: Tob: Smokes  Exam: Current vital signs: BP 134/89  Pulse 85  Temp(Src) 98.2 F (36.8 C) (Oral)  Resp 15  SpO2 100% Vital signs in last 24 hours: Temp:  [98.2 F (36.8 C)] 98.2 F (36.8 C) (04/18 0706) Pulse Rate:  [85-98] 85 (04/18 0800) Resp:  [15-18] 15 (04/18 0800) BP: (134-177)/(89-96) 134/89 mmHg (04/18 0800) SpO2:  [100 %] 100 % (04/18 0926)  General: In bed, nonrebreather in place CV: Regular rate and rhythm Mental Status: Patient is sedated but awakens easily, able to follow commands and answer questions. She is able to  identify her daughter in the room. She states that she remembers me from yesterday. She has no signs of aphasia or neglect. Cranial Nerves: II: Legs to threat bilaterally. Pupils are equal, round, and reactive to light.  Discs without papilledema. III,IV, VI: EOMI without ptosis or diploplia.  V: Facial sensation is symmetric to temperature VII: Facial movement is symmetric.  VIII: hearing is intact to voice X: Uvula elevates symmetrically XI: Shoulder shrug is symmetric. XII: tongue is midline without atrophy or fasciculations.  Motor: Tone is normal. Bulk is normal. 5/5 strength was present in all four extremities.  Sensory: Sensation is symmetric to light touch in the arms and legs. Deep Tendon Reflexes: 2+ and symmetric in the biceps and patellae.  Cerebellar: No obvious dysmetria when reaching for objects Gait: Did not assess 2/2 sedation.    I have reviewed labs in epic and the results pertinent to this consultation are: Low K nml calcium  I have reviewed the images obtained:CT head - old subcortical infarct.   Impression: 57 yo F with two seizures this morning. She has not had much sleep due to her husbands hospitalization and it is possible that this could lower seizure threshold some in a predisposed person. With the incontinence and witnessed head deviation and tonic clonic activity, I do feel that true seizure is the most likely diagnosis. Her previous stroke was subcortical and would be  an unusual seizure focus, and therefore new imaging with MRI brain and evaluation with EEG would be prudent. I do feel that treatment with an AED is merited given her repeated episodes.   Recommendations: 1) MRI brain  2) EEG 3) Keppra 4) Evaluation of elevated troponin per ED, cardiology.    Ritta Slot, MD [facility] [phone]  If 7pm- 7am, please page neurology on call at [phone].

## 2012-05-19 NOTE — ED Notes (Signed)
3000 notified of pt situation with MRI and MRI would be completed before pt comes to floor  Daughter number [phone] [facility]

## 2012-05-19 NOTE — Progress Notes (Signed)
ANTICOAGULATION CONSULT NOTE - Follow Up Consult  Pharmacy Consult for heparin Indication: chest pain/ACS  Allergies  Allergen Reactions  . Codeine Itching    Patient Measurements: Height: 5' 4.96" (165 cm) Weight: 144 lb 10 oz (65.6 kg) IBW/kg (Calculated) : 56.91 Heparin Dosing Weight: 65 kg  Vital Signs: Temp: 98.5 F (36.9 C) (04/18 2032) Temp src: Oral (04/18 2032) BP: 113/74 mmHg (04/18 2032) Pulse Rate: 69 (04/18 2032)  Labs:  Recent Labs  05/19/12 0707 05/19/12 1235 05/19/12 1458 05/19/12 2005 05/19/12 2209  HGB 14.4  --   --   --   --   HCT 40.4  --   --   --   --   PLT 202  --   --   --   --   HEPARINUNFRC  --   --   --   --  0.14*  CREATININE 0.61 0.60  --   --   --   CKTOTAL  --  125  --   --   --   CKMB  --  4.4*  --   --   --   TROPONINI 0.73*  --  1.18* 0.96*  --     Estimated Creatinine Clearance: 68.9 ml/min (by C-G formula based on Cr of 0.6).   Medications:  Scheduled:  . [COMPLETED] sodium chloride   Intravenous Once  . [START ON 05/20/2012] aspirin EC  325 mg Oral Daily  . atorvastatin  80 mg Oral q1800  . [COMPLETED] heparin  4,000 Units Intravenous Once  . [COMPLETED] levETIRAcetam  1,000 mg Intravenous Once  . levETIRAcetam  500 mg Oral BID  . [COMPLETED] LORazepam      . LORazepam  1 mg Intravenous Once  . [EXPIRED] potassium chloride  10 mEq Intravenous Q1 Hr x 3  . [COMPLETED] potassium chloride  10 mEq Intravenous Once  . [EXPIRED] potassium chloride  10 mEq Intravenous Q1 Hr x 4  . sertraline  50 mg Oral Daily  . sodium chloride  3 mL Intravenous Q12H  . [DISCONTINUED] enoxaparin (LOVENOX) injection  40 mg Subcutaneous Q24H  . [DISCONTINUED] INVESTIGATIONAL DRUG SIMPLE RECORD  75 mg Oral Q breakfast   Infusions:  . sodium chloride 100 mL/hr at 05/19/12 1622  . heparin 800 Units/hr (05/19/12 1617)    Assessment: 57 yo female with r/o ACS on IV heparin therapy.  No acute intracranial abnormality on MR of the brain.    Heparin level (0.14) is below-goal on 800 units/hr. No problem with line / infusion and no bleeding per RN.   Goal of Therapy:  Heparin level 0.3-0.7 units/ml Monitor platelets by anticoagulation protocol: Yes   Plan:  1. Heparin 2000 unit IV bolus x 1, then increase IV infusion to 1100 units/hr.  2. Heparin level in 6 hours.   Emeline Gins 05/19/2012,11:20 PM

## 2012-05-19 NOTE — Progress Notes (Signed)
ANTICOAGULATION CONSULT NOTE - Follow Up Consult  Pharmacy Consult for heparin Indication: chest pain/ACS  Allergies  Allergen Reactions  . Codeine Itching    Patient Measurements: Height: 5' 4.96" (165 cm) Weight: 144 lb 10 oz (65.6 kg) IBW/kg (Calculated) : 56.91 Heparin Dosing Weight: 65 kg  Vital Signs: Temp: 99.8 F (37.7 C) (04/18 1457) Temp src: Oral (04/18 1457) BP: 107/68 mmHg (04/18 1457) Pulse Rate: 83 (04/18 1457)  Labs:  Recent Labs  05/19/12 0707 05/19/12 1235  HGB 14.4  --   HCT 40.4  --   PLT 202  --   CREATININE 0.61 0.60  CKTOTAL  --  125  CKMB  --  4.4*  TROPONINI 0.73*  --     Estimated Creatinine Clearance: 68.9 ml/min (by C-G formula based on Cr of 0.6).   Medications:  Scheduled:  . [COMPLETED] sodium chloride   Intravenous Once  . [START ON 05/20/2012] aspirin EC  325 mg Oral Daily  . [COMPLETED] levETIRAcetam  1,000 mg Intravenous Once  . levETIRAcetam  500 mg Oral BID  . [COMPLETED] LORazepam      . LORazepam  1 mg Intravenous Once  . [EXPIRED] potassium chloride  10 mEq Intravenous Q1 Hr x 3  . sertraline  50 mg Oral Daily  . sodium chloride  3 mL Intravenous Q12H  . [DISCONTINUED] enoxaparin (LOVENOX) injection  40 mg Subcutaneous Q24H  . [DISCONTINUED] INVESTIGATIONAL DRUG SIMPLE RECORD  75 mg Oral Q breakfast   Infusions:  . sodium chloride      Assessment: 57 yo female with r/o ACS will be started on heparin therapy.  Not on any anticoagulant prior to admission.  No acute intracranial abnormality on MR of the brain.  Didn't receive the lovenox dose yet.   Goal of Therapy:  Heparin level 0.3-0.7 units/ml Monitor platelets by anticoagulation protocol: Yes   Plan:  1) Heparin 4000 units iv bolus x1, then start heparin drip at 800 units/hr 2) Check a 6hr heparin level after drip is started. 3) Daily heparin level and CBC  Anna Lyons, Tsz-Yin 05/19/2012,3:31 PM

## 2012-05-19 NOTE — ED Notes (Signed)
Pt was visiting husband in hospital and was found in adjacent room incontinent of stool.  Cot in pt's husbands room was found to be incontinent of urine.  Pt was very confused and brought to ED.  One in ED room pt had seizure like activity that lasted approximately one minute.  MD made aware.

## 2012-05-19 NOTE — ED Notes (Signed)
Nurse caring for PT's husband states pt was last seen awake prior to midnight.  At 0400 pt was known to have been asleep in the bed.  Pt's husband stated pt was mumbling words at 0200 this morning.

## 2012-05-19 NOTE — ED Notes (Signed)
Pt taken to MRI at this time; family walking with pt to MRI

## 2012-05-19 NOTE — ED Notes (Signed)
Pt. Came from 3w. Husband is in room 33 on [address]. Pt. Had a grand mal seizure.

## 2012-05-19 NOTE — Procedures (Signed)
History: 57 yo F with two observed seizures  Background: There is a well defined posterior dominant rhythm of 9.5 Hz. The bulk of this EEG is performed during sleep with normal appearing sleep structures. There are times when the patient rouses, but remains  drowsy. There is delta activity associated with drowsiness.   Photic stimulation: Physiologic driving is not performed  EEG Abnormalities: None  Clinical Interpretation: This normal EEG is recorded in the waking, dorwsy and sleep state. There was no seizure or seizure predisposition recorded on this study.   Ritta Slot, MD [facility] [phone]  If 7pm- 7am, please page neurology on call at [phone].

## 2012-05-19 NOTE — Progress Notes (Signed)
Utilization review completed. Alvilda Mckenna, RN, BSN. 

## 2012-05-19 NOTE — ED Notes (Signed)
Pt returned from MRI; pt placed back on monitor; Cardiology at bedside

## 2012-05-19 NOTE — ED Notes (Signed)
EEG at bedside.

## 2012-05-19 NOTE — ED Notes (Signed)
Temp foley placed in patient.  Sterile technique used through-out procedure.  Good urine return.  Urine yellow and cloudy.  Patient tolerated well.

## 2012-05-19 NOTE — H&P (Signed)
INTERNAL MEDICINE TEACHING SERVICE Attending Admission Note  Date: 05/19/2012  Patient name: Anna Lyons  Medical record number: [id number]  Date of birth: 1955-06-06    I have seen and evaluated Anna Lyons and discussed their care with the Residency Team.  49 yr. Old WF with hx of HTN, CVA with residual right hand/RLE weakness, tobacco abuse, presented with witnessed seizure event. Her husband is current hospitalized at [facility] and the event occurred in his hospital room.  She was found to have tonic-clonic activity and incontinence of urine, stool.  She was further witnessed in the ED to have tonic-clonic activity and administered BZD with cessation of seizure activity.  The patient stated she does not remember the events leading up to her ED visit.  I discussed the case with her daughter at the bedside and she states this has never happened before. She does think her mother has been "breathing more heavily" recently, but has not had any complaints to her knowledge.  There is a question as to whether she has not had enough sleep, but her daughter states she called her yesterday and she stated she was sleeping before the call. She drinks wine occasionally per daughter. She has had a fever up to 101.5 since being in the ED, but has been normotensive. She was placed on O2, but is being weaned off without hypoxia noted.   Lab studies were notable for a K of 2.8, Trop I of 0.73, UA with ketones. CT of the brain w/o contrast showed chronic left basal ganglia lacunar infarcts as well as small vessel ischemic change, but no acute findings. A CXR was performed which revealed no acute findings. She was currently having an EEG performed and was able to answer few questions for me. Most of the history was obtained from her daughter at bedside. Physical Exam: Blood pressure 117/73, pulse 88, temperature 101.5 F (38.6 C), temperature source Tympanic, resp. rate 16, height 5' 4.96" (1.65 m), weight 144 lb 10 oz (65.6  kg), SpO2 97.00%.  General: Groggy, but able to tell me her name, year, and the president's name. NAD.  Head: Normocephalic, atraumatic.  Eyes: PERRL, EOMI, No signs of anemia or jaundince.  Nose: Mucous membranes moist, not inflammed, nonerythematous.  Throat: Oropharynx nonerythematous, no exudate appreciated.   Neck: No deformities, masses, or tenderness noted.Supple, No carotid Bruits, no JVD.  Lungs:  Normal respiratory effort. Clear to auscultation BL without crackles or wheezes.  Heart: RRR. S1 and S2 normal without gallop, murmur, or rubs.  Abdomen:  BS normoactive. Soft, Nondistended, non-tender.  No masses or organomegaly.  Extremities: 2x3 cm ecchymotic region of her left inner thigh. 2/4 pulses, no c/c/e.  Neurologic: A&O X3, CN II - XII are grossly intact. Moves all her extremities. 2+ DTR LLE, 3+ DTR RLE. RUE/LUE 2+ DTR. Sensation was intact throughout.  Skin: echymosis on left inner thigh as stated above.    Lab results: Results for orders placed during the hospital encounter of 05/19/12 (from the past 24 hour(s))  BASIC METABOLIC PANEL     Status: Abnormal   Collection Time    05/19/12  7:07 AM      Result Value Range   Sodium 138  135 - 145 mEq/L   Potassium 2.8 (*) 3.5 - 5.1 mEq/L   Chloride 96  96 - 112 mEq/L   CO2 20  19 - 32 mEq/L   Glucose, Bld 221 (*) 70 - 99 mg/dL   BUN 9  6 -  23 mg/dL   Creatinine, Ser 1.61  0.50 - 1.10 mg/dL   Calcium 9.5  8.4 - 09.6 mg/dL   GFR calc non Af Amer >90  >90 mL/min   GFR calc Af Amer >90  >90 mL/min  CBC     Status: None   Collection Time    05/19/12  7:07 AM      Result Value Range   WBC 10.5  4.0 - 10.5 K/uL   RBC 4.30  3.87 - 5.11 MIL/uL   Hemoglobin 14.4  12.0 - 15.0 g/dL   HCT 04.5  40.9 - 81.1 %   MCV 94.0  78.0 - 100.0 fL   MCH 33.5  26.0 - 34.0 pg   MCHC 35.6  30.0 - 36.0 g/dL   RDW 91.4  78.2 - 95.6 %   Platelets 202  150 - 400 K/uL  TROPONIN I     Status: Abnormal   Collection Time    05/19/12  7:07 AM       Result Value Range   Troponin I 0.73 (*) <0.30 ng/mL  MAGNESIUM     Status: None   Collection Time    05/19/12  7:07 AM      Result Value Range   Magnesium 1.6  1.5 - 2.5 mg/dL  GLUCOSE, CAPILLARY     Status: Abnormal   Collection Time    05/19/12  7:51 AM      Result Value Range   Glucose-Capillary 163 (*) 70 - 99 mg/dL  URINALYSIS, ROUTINE W REFLEX MICROSCOPIC     Status: Abnormal   Collection Time    05/19/12  9:20 AM      Result Value Range   Color, Urine YELLOW  YELLOW   APPearance HAZY (*) CLEAR   Specific Gravity, Urine 1.017  1.005 - 1.030   pH 6.0  5.0 - 8.0   Glucose, UA NEGATIVE  NEGATIVE mg/dL   Hgb urine dipstick NEGATIVE  NEGATIVE   Bilirubin Urine NEGATIVE  NEGATIVE   Ketones, ur 15 (*) NEGATIVE mg/dL   Protein, ur NEGATIVE  NEGATIVE mg/dL   Urobilinogen, UA 0.2  0.0 - 1.0 mg/dL   Nitrite NEGATIVE  NEGATIVE   Leukocytes, UA NEGATIVE  NEGATIVE    Imaging results:  Ct Head Wo Contrast   (if New Onset Seizure And/or Head Trauma)  05/19/2012  *RADIOLOGY REPORT*  Clinical Data: Seizure  CT HEAD WITHOUT CONTRAST  Technique:  Contiguous axial images were obtained from the base of the skull through the vertex without contrast.  Comparison: 11/16/2011  Findings: Dilated perivascular space within the right basal ganglia is unchanged from previous exam.  Old left basal ganglia infarct is similar to previous exam.  There is mild patchy low attenuation within the subcortical white matter identified bilaterally suggestive of chronic small vessel ischemic change.  The sulci and ventricular volumes appear within normal limits.  The midline is maintained. There is no evidence for acute brain infarct, hemorrhage or mass. The mastoid air cells and paranasal sinuses appear clear.  The skull appears intact.  IMPRESSION:  1.  No acute intracranial abnormalities. 2.  Small vessel ischemic change. 3.  Chronic left basal ganglia lacunar infarcts.   Original Report Authenticated By: Signa Kell, M.D.    Dg Chest Portable 1 View  05/19/2012  *RADIOLOGY REPORT*  Clinical Data: Seizures  PORTABLE CHEST - 1 VIEW  Comparison: 11/16/2011  Findings: Cardiomediastinal silhouette is stable.  No acute infiltrate or pleural effusion.  No  pulmonary edema.  Mild left basilar atelectasis.  IMPRESSION: No active disease.  Mild left basilar atelectasis.   Original Report Authenticated By: Natasha Mead, M.D.      Assessment and Plan: I agree with the formulated Assessment and Plan with the following changes:   49 yr. Old WF with hx of HTN, CVA with residual right hand/RLE weakness, tobacco abuse, presented with witnessed seizure event. 1) Seizure: DDx are multiple. CV event such as MI, CVA need to be ruled out first. She has had a previous CVA and is currently followed by Dr. Pearlean Brownie on Placebo vs. Plavix.  She was noted to be hypokalemic and have ketones in her urine, this may represent decreased PO intake since visiting her husband in the hospital and continued use of HCTZ, perhaps without KCl.  Hx wine use, but I'm not sure if this is withdrawal, she should be monitored.  Neurology has been consulted. She will need to continue Keppra load, MRI of the brain as well.  EEG at this time will not give [place] much information, as she is medicated and I do not suspect status epilepticus at this time. 2) Elevated Trop: Trend for total of 3. Cardiology has been consulted. She has a LHC from 05/2011 with no significant CAD and an EF of 65%. She does not have a reported hx of Afib, she should be monitored on telemetry. Trend CPK/CKMB as well. Upon seeing the returned normal CK but elevated CKMB with a positive Trop I, a primary cardiac event must be considered.  3) HTN: Hold HCTZ while workup progressing in case this is a CVA, allow for permissive HTN for now. 3) Hypokalemia: hold HCTZ. Replace K.  4) rest per resident note.   Jonah Blue, [place] 4/18/20141:01 PM

## 2012-05-20 DIAGNOSIS — I519 Heart disease, unspecified: Secondary | ICD-10-CM

## 2012-05-20 DIAGNOSIS — I6350 Cerebral infarction due to unspecified occlusion or stenosis of unspecified cerebral artery: Secondary | ICD-10-CM

## 2012-05-20 LAB — CBC
Hemoglobin: 11.9 g/dL — ABNORMAL LOW (ref 12.0–15.0)
MCH: 33.3 pg (ref 26.0–34.0)
RBC: 3.57 MIL/uL — ABNORMAL LOW (ref 3.87–5.11)

## 2012-05-20 LAB — BASIC METABOLIC PANEL
CO2: 26 mEq/L (ref 19–32)
Chloride: 105 mEq/L (ref 96–112)
GFR calc non Af Amer: >90 mL/min (ref >90)
Glucose, Bld: 90 mg/dL (ref 70–99)
Potassium: 3.9 mEq/L (ref 3.5–5.1)
Sodium: 138 mEq/L (ref 135–145)

## 2012-05-20 LAB — HEPARIN LEVEL (UNFRACTIONATED): Heparin Unfractionated: 0.31 IU/mL (ref 0.30–0.70)

## 2012-05-20 LAB — MAGNESIUM: Magnesium: 1.8 mg/dL (ref 1.5–2.5)

## 2012-05-20 LAB — TROPONIN I: Troponin I: 0.58 ng/mL (ref <0.30)

## 2012-05-20 MED ORDER — MAGNESIUM SULFATE 40 MG/ML IJ SOLN
2.0000 g | Freq: Once | INTRAMUSCULAR | Status: AC
  Administered 2012-05-20: 2 g via INTRAVENOUS
  Filled 2012-05-20: qty 50

## 2012-05-20 MED ORDER — HEPARIN SODIUM (PORCINE) 5000 UNIT/ML IJ SOLN
5000.0000 [IU] | Freq: Three times a day (TID) | INTRAMUSCULAR | Status: DC
  Administered 2012-05-20 – 2012-05-21 (×3): 5000 [IU] via SUBCUTANEOUS
  Filled 2012-05-20 (×6): qty 1

## 2012-05-20 MED ORDER — HEPARIN (PORCINE) IN NACL 100-0.45 UNIT/ML-% IJ SOLN
1300.0000 [IU]/h | INTRAMUSCULAR | Status: DC
  Administered 2012-05-20: 1300 [IU]/h via INTRAVENOUS
  Filled 2012-05-20: qty 250

## 2012-05-20 NOTE — Progress Notes (Signed)
PROGRESS NOTE  Subjective:   Anna Lyons is a 57 yo who was admitted with a seizure. Found to have mildly elevated Troponin levels.  Has had a normal cath in the  past.   She is feeling well.  She was over visiting with her husband who is in 3W 32 this am.  Spoke to and discussed situation with  daughter who was also in the room.    Echo has been ordered - results not back yet.   Objective:    Vital Signs:   Temp:  [98.4 F (36.9 C)-101.5 F (38.6 C)] 98.4 F (36.9 C) (04/19 0433) Pulse Rate:  [67-89] 67 (04/19 0433) Resp:  [13-18] 18 (04/19 0433) BP: (105-137)/(65-104) 119/77 mmHg (04/19 0433) SpO2:  [95 %-100 %] 95 % (04/19 0433) Weight:  [144 lb 10 oz (65.6 kg)] 144 lb 10 oz (65.6 kg) (04/18 0952)  Last BM Date: 05/19/12   24-hour weight change: Weight change:   Weight trends: Filed Weights   05/19/12 0952  Weight: 144 lb 10 oz (65.6 kg)    Intake/Output:  04/18 0701 - 04/19 0700 In: -  Out: 600 [Urine:600]     Physical Exam: BP 119/77  Pulse 67  Temp(Src) 98.4 F (36.9 C) (Oral)  Resp 18  Ht 5' 4.96" (1.65 m)  Wt 144 lb 10 oz (65.6 kg)  BMI 24.1 kg/m2  SpO2 95%  General: Vital signs reviewed and noted.   Head: Normocephalic, atraumatic.  Eyes: conjunctivae/corneas clear.  EOM's intact.   Throat: normal  Neck:  normal   Lungs:  normal  Heart:  RR, normal S1, S2  Abdomen:  Soft, non-tender, non-distended    Extremities: No edema   Neurologic: A&O X3, CN II - XII are grossly intact.   Psych: Normal     Labs: BMET:  Recent Labs  05/19/12 0707  05/19/12 2206 05/20/12 0710  NA 138  < > 137 138  K 2.8*  < > 3.0* 3.9  CL 96  < > 101 105  CO2 20  < > 28 26  GLUCOSE 221*  < > 93 90  BUN 9  < > 9 8  CREATININE 0.61  < > 0.65 0.60  CALCIUM 9.5  < > 8.4 8.3*  MG 1.6  --  1.8  --   < > = values in this interval not displayed.  Liver function tests:  Recent Labs  05/19/12 1235  AST 27  ALT 15  ALKPHOS 62  BILITOT 0.5  PROT 7.0  ALBUMIN  3.6   No results found for this basename: LIPASE, AMYLASE,  in the last 72 hours  CBC:  Recent Labs  05/19/12 0707 05/20/12 0710  WBC 10.5 5.3  HGB 14.4 11.9*  HCT 40.4 34.3*  MCV 94.0 96.1  PLT 202 138*    Cardiac Enzymes:  Recent Labs  05/19/12 0707 05/19/12 1235 05/19/12 1458 05/19/12 2005  CKTOTAL  --  125  --   --   CKMB  --  4.4*  --   --   TROPONINI 0.73*  --  1.18* 0.96*    Coagulation Studies: No results found for this basename: LABPROT, INR,  in the last 72 hours  Other: No components found with this basename: POCBNP,  No results found for this basename: DDIMER,  in the last 72 hours No results found for this basename: HGBA1C,  in the last 72 hours  Recent Labs  05/19/12 1235  CHOL 152  HDL 50  LDLCALC 83  TRIG 95  CHOLHDL 3.0    Recent Labs  05/19/12 1458  TSH 1.778   No results found for this basename: VITAMINB12, FOLATE, FERRITIN, TIBC, IRON, RETICCTPCT,  in the last 72 hours   Other results: Tele:  NSR  Medications:    Infusions: . sodium chloride 100 mL/hr at 05/19/12 1622  . heparin      Scheduled Medications: . aspirin EC  325 mg Oral Daily  . atorvastatin  80 mg Oral q1800  . levETIRAcetam  500 mg Oral BID  . LORazepam  1 mg Intravenous Once  . sertraline  50 mg Oral Daily  . sodium chloride  3 mL Intravenous Q12H    Assessment/ Plan:   Principal Problem:   Seizure Active Problems:   History of stroke   Essential hypertension, benign   NSTEMI, initial episode of care; Type 2  Enzymes are trending down.  I suspect this was a stress mediated release of Troponin ( similar to Takotsubo syndrome as suggested by Dr. Shirlee Latch)  Will Dc heparin. Awaiting echo results.  I think she can get OP stress myoview.  Call [phone] to arrange a Lexiscan myoview.    Will sign off unless the echo shows significant abnormalities.    Disposition:  Length of Stay: 1  Vesta Mixer, Montez Hageman., MD, [facility] 05/20/2012, 9:37 AM Office  [phone] Pager [phone]

## 2012-05-20 NOTE — Evaluation (Signed)
Physical Therapy Evaluation Patient Details Name: Anna Lyons MRN: [id number] DOB: September 18, 1955 Today's Date: 05/20/2012 Time: 4098-1191 PT Time Calculation (min): 20 min  PT Assessment / Plan / Recommendation Clinical Impression  Patient is a 57 y/o female with n/o CVA and new onset seizures.  Has decreased balance and decreased independence with mobiltiy and will benefit from skilled PT in the acute setting to maximize independence and allow return home with HHPT and walker.  May need follow up out patient rehab for right shoudler pain since her CVA once able to return to driving.    PT Assessment  Patient needs continued PT services    Follow Up Recommendations  Home health PT;Supervision - Intermittent          Equipment Recommendations  Rolling walker with 5" wheels       Frequency Min 3X/week    Precautions / Restrictions Precautions Precautions: Fall Precaution Comments: seizure   Pertinent Vitals/Pain Right shoulder pain       Mobility  Bed Mobility Bed Mobility: Supine to Sit;Sit to Supine;Scooting to HOB Supine to Sit: 5: Supervision;HOB elevated Sit to Supine: HOB elevated;5: Supervision Scooting to [facility]: With rail;4: Min assist Details for Bed Mobility Assistance: difficulty coming up to sit, laid HOB down to allow to scoot to head of bed Transfers Transfers: Sit to Stand;Stand to Sit Sit to Stand: From bed;4: Min guard Stand to Sit: To bed;4: Min guard Details for Transfer Assistance: unsteady on feet initially  Ambulation/Gait Ambulation/Gait Assistance: 4: Min assist;5: Supervision Ambulation Distance (Feet): 300 Feet Assistive device: None;Rolling walker Ambulation/Gait Assistance Details: initially no device with loss of balance to right and needed min assist to recover, used RW about 1/2 of distance with supervision only for safety Gait Pattern: Step-through pattern;Decreased stride length General Gait Details: improved speed with walker        PT  Diagnosis: Abnormality of gait  PT Problem List: Decreased activity tolerance;Decreased balance;Decreased mobility;Decreased knowledge of use of DME;Pain PT Treatment Interventions: DME instruction;Gait training;Neuromuscular re-education;Balance training;Functional mobility training;Patient/family education;Therapeutic activities;Therapeutic exercise   PT Goals Acute Rehab PT Goals PT Goal Formulation: With patient Time For Goal Achievement: 06/03/12 Potential to Achieve Goals: Good Pt will go Supine/Side to Sit: with modified independence PT Goal: Supine/Side to Sit - Progress: Goal set today Pt will go Sit to Supine/Side: with modified independence PT Goal: Sit to Supine/Side - Progress: Goal set today Pt will go Sit to Stand: with modified independence PT Goal: Sit to Stand - Progress: Goal set today Pt will go Stand to Sit: with modified independence PT Goal: Stand to Sit - Progress: Goal set today Pt will Stand: with modified independence;3 - 5 min;with unilateral upper extremity support PT Goal: Stand - Progress: Goal set today Pt will Ambulate: >150 feet;with modified independence;with least restrictive assistive device PT Goal: Ambulate - Progress: Goal set today  Visit Information  Last PT Received On: 05/20/12 Assistance Needed: +1    Subjective Data  Subjective: I've been to the bathroom.  My husband got to go home, I didn't he needs me to help him. Patient Stated Goal: To return home.   Prior Functioning  Home Living Lives With: Spouse Available Help at Discharge: Family;Available PRN/intermittently Type of Home: Apartment Home Access: Level entry Home Layout: One level Bathroom Shower/Tub: Tub/shower unit Home Adaptive Equipment: Shower chair with back;Wheelchair - Research scientist (medical) Comments: spouse not able to assist, daughter works,, but can help with household mgmt and transportation Prior Function Level of  Independence: Independent Driving:  Yes Comments: states MD said she can't drive 6 weeks Communication Communication: No difficulties Dominant Hand: Right    Cognition  Cognition Arousal/Alertness: Awake/alert Behavior During Therapy: WFL for tasks assessed/performed Overall Cognitive Status: Within Functional Limits for tasks assessed    Extremity/Trunk Assessment Right Upper Extremity Assessment RUE ROM/Strength/Tone: Deficits;Due to pain RUE ROM/Strength/Tone Deficits: lifts AROM to about 90 flexion, AAROM to 150 with pain Left Upper Extremity Assessment LUE ROM/Strength/Tone: WFL for tasks assessed Right Lower Extremity Assessment RLE ROM/Strength/Tone: [facility] for tasks assessed Left Lower Extremity Assessment LLE ROM/Strength/Tone: WFL for tasks assessed   Balance Balance Balance Assessed: Yes Static Standing Balance Static Standing - Balance Support: No upper extremity supported Static Standing - Level of Assistance: 5: Stand by assistance  End of Session PT - End of Session Equipment Utilized During Treatment: Gait belt Activity Tolerance: Patient tolerated treatment well Patient left: in bed;with call bell/phone within reach  GP     [facility] 05/20/2012, 4:07 PM Farrell, [place] [phone] 05/20/2012

## 2012-05-20 NOTE — Progress Notes (Signed)
  Echocardiogram 2D Echocardiogram has been performed.  [place] Rios FRANCES 05/20/2012, 12:21 PM

## 2012-05-20 NOTE — Progress Notes (Signed)
Subjective: Feeling better this morning without specific complaints. No recurrent seizure activity overnight. Says she still does not remember the events surrounding her seizure yesterday. Tearful about being in the hospital while her husband is also sick, distressed about 6 month driving restriction as discussed with neurologist.  Objective: Vital signs in last 24 hours: Filed Vitals:   05/19/12 1357 05/19/12 1457 05/19/12 2032 05/20/12 0433  BP: 111/78 107/68 113/74 119/77  Pulse: 83 83 69 67  Temp: 100.9 F (38.3 C) 99.8 F (37.7 C) 98.5 F (36.9 C) 98.4 F (36.9 C)  TempSrc: Tympanic Oral Oral Oral  Resp: 13 16 18 18   Height:      Weight:      SpO2: 99% 95% 97% 95%   Weight change:   Intake/Output Summary (Last 24 hours) at 05/20/12 0839 Last data filed at 05/20/12 0434  Gross per 24 hour  Intake      0 ml  Output    600 ml  Net   -600 ml   Vitals reviewed. General: sitting up in bed, tearful HEENT: PERRL, EOMI, no scleral icterus Cardiac: RRR, no rubs, murmurs or gallops Pulm: clear to auscultation bilaterally, no wheezes, rales, or rhonchi Abd: soft, nontender, nondistended, BS present Ext: warm and well perfused, no pedal edema Neuro: alert and oriented X3, cranial nerves II-XII grossly intact, some decreased strength in R arm from old CVA, sensation intact to light touch bilateral extremities.  Lab Results: Basic Metabolic Panel:  Recent Labs Lab 05/19/12 0707 05/19/12 1235 05/19/12 2206  NA 138 137 137  K 2.8* 3.1* 3.0*  CL 96 100 101  CO2 20 28 28   GLUCOSE 221* 121* 93  BUN 9 9 9   CREATININE 0.61 0.60 0.65  CALCIUM 9.5 8.9 8.4  MG 1.6  --  1.8   Liver Function Tests:  Recent Labs Lab 05/19/12 1235  AST 27  ALT 15  ALKPHOS 62  BILITOT 0.5  PROT 7.0  ALBUMIN 3.6   CBC:  Recent Labs Lab 05/19/12 0707 05/20/12 0710  WBC 10.5 5.3  HGB 14.4 11.9*  HCT 40.4 34.3*  MCV 94.0 96.1  PLT 202 138*   Cardiac Enzymes:  Recent Labs Lab  05/19/12 0707 05/19/12 1235 05/19/12 1458 05/19/12 2005  CKTOTAL  --  125  --   --   CKMB  --  4.4*  --   --   TROPONINI 0.73*  --  1.18* 0.96*   CBG:  Recent Labs Lab 05/19/12 0751  GLUCAP 163*   Fasting Lipid Panel:  Recent Labs Lab 05/19/12 1235  CHOL 152  HDL 50  LDLCALC 83  TRIG 95  CHOLHDL 3.0   Thyroid Function Tests:  Recent Labs Lab 05/19/12 1458  TSH 1.778   Urine Drug Screen: Drugs of Abuse     Component Value Date/Time   LABOPIA NONE DETECTED 11/17/2011 1459   COCAINSCRNUR NONE DETECTED 11/17/2011 1459   LABBENZ NONE DETECTED 11/17/2011 1459   AMPHETMU NONE DETECTED 11/17/2011 1459   THCU NONE DETECTED 11/17/2011 1459   LABBARB NONE DETECTED 11/17/2011 1459    Urinalysis:  Recent Labs Lab 05/19/12 0920  COLORURINE YELLOW  LABSPEC 1.017  PHURINE 6.0  GLUCOSEU NEGATIVE  HGBUR NEGATIVE  BILIRUBINUR NEGATIVE  KETONESUR 15*  PROTEINUR NEGATIVE  UROBILINOGEN 0.2  NITRITE NEGATIVE  LEUKOCYTESUR NEGATIVE   Studies/Results: Ct Head Wo Contrast   (if New Onset Seizure And/or Head Trauma)  05/19/2012  *RADIOLOGY REPORT*  Clinical Data: Seizure  CT HEAD WITHOUT  CONTRAST  Technique:  Contiguous axial images were obtained from the base of the skull through the vertex without contrast.  Comparison: 11/16/2011  Findings: Dilated perivascular space within the right basal ganglia is unchanged from previous exam.  Old left basal ganglia infarct is similar to previous exam.  There is mild patchy low attenuation within the subcortical white matter identified bilaterally suggestive of chronic small vessel ischemic change.  The sulci and ventricular volumes appear within normal limits.  The midline is maintained. There is no evidence for acute brain infarct, hemorrhage or mass. The mastoid air cells and paranasal sinuses appear clear.  The skull appears intact.  IMPRESSION:  1.  No acute intracranial abnormalities. 2.  Small vessel ischemic change. 3.  Chronic  left basal ganglia lacunar infarcts.   Original Report Authenticated By: Anna Lyons, M.D.    Mr Brain Wo Contrast  05/19/2012  *RADIOLOGY REPORT*  Clinical Data: 57 year old female with right head and night deviation followed by tonic clonic seizure activity.  No prior seizure history.  Found down. History of stroke.  MRI HEAD WITHOUT CONTRAST  Technique:  Multiplanar, multiecho pulse sequences of the brain and surrounding structures were obtained according to standard protocol without intravenous contrast.  Comparison: Head CT 05/19/2012.  Brain MRI 11/16/2011 and earlier.  Findings: Stable cerebral volume. No restricted diffusion to suggest acute infarction.  No midline shift, mass effect, evidence of mass lesion, ventriculomegaly, extra-axial collection or acute intracranial hemorrhage.  Cervicomedullary junction and pituitary are within normal limits.  Major intracranial vascular flow voids are stable.  Chronic left corona radiata/external capsule lacunar infarct again noted.  Additional bilateral basal ganglia dilated perivascular spaces or other chronic lacunae.  Wallerian degeneration on the left.  No definite chronic blood products. Stable gray and white matter signal throughout the brain . No definite asymmetric signal in the hippocampal complexes; thin coronal images somewhat degraded by noise.  Negative visualized cervical spine.  Normal bone marrow signal. Visualized orbit soft tissues are within normal limits.  Stable paranasal sinuses and mastoids; small right mastoid effusion. Stable and negative visualized nasopharynx.  Grossly normal visualized internal auditory structures.  Negative scalp soft tissues.  IMPRESSION: Stable noncontrast MRI appearance of the brain with chronic small vessel ischemia greater on the left. No acute intracranial abnormality.   Original Report Authenticated By: Anna Lyons, M.D.    Dg Chest Portable 1 View  05/19/2012  *RADIOLOGY REPORT*  Clinical Data: Seizures   PORTABLE CHEST - 1 VIEW  Comparison: 11/16/2011  Findings: Cardiomediastinal silhouette is stable.  No acute infiltrate or pleural effusion.  No pulmonary edema.  Mild left basilar atelectasis.  IMPRESSION: No active disease.  Mild left basilar atelectasis.   Original Report Authenticated By: Anna Lyons, M.D.    Medications: I have reviewed the patient's current medications. Scheduled Meds: . aspirin EC  325 mg Oral Daily  . atorvastatin  80 mg Oral q1800  . levETIRAcetam  500 mg Oral BID  . LORazepam  1 mg Intravenous Once  . sertraline  50 mg Oral Daily  . sodium chloride  3 mL Intravenous Q12H   Continuous Infusions: . sodium chloride 100 mL/hr at 05/19/12 1622  . heparin 1,100 Units/hr (05/19/12 2352)   PRN Meds:.acetaminophen, acetaminophen, ondansetron (ZOFRAN) IV, ondansetron  Assessment/Plan: Anna Lyons is a 57 year old woman who presents for admission after being found down incontinent of stool and urine and later witnessed to have secondary generalized tonic-clonic seizure.   #Seizure  Discussed case with Anna Lyons,  neurologist. Patient has no evidence of new acute stroke on MRI and no other metabolic abnormalities to explain her seizure. Anna Lyons believes that old juxtacortical ischemic changes and sleep deprivation precipitated acute seizure. - Plan for Keppra 500 mg twice a day, followup with Anna Lyons as an outpatient. -Discussed driving, activity restrictions with patient - D/C foley  #Elevated troponin  Noted to have troponin of 0.79 yesterday which peaked at 1.18, most recently downtrended to 0.96. Has a history of chest pain the past and a workup with a cardiac catheterization which was negative for any coronary artery disease one year ago.  Cardiology was consulted, concern for sympathetic nervous system "surge" in the setting of the seizures leading to elevated troponin.  - per cardiology recommendation, heparinizing patient. Will likely DC heparin if next  troponin continues to show downtrend -Echo pending - If troponin continues downtrend, outpatient stress test  #Hypokalemia  Potassium 2.8 on admission. Has had borderline low potassium noted on prior hospitalizations, as low as 2.8 about a year ago. On diuretic therapy with HCTZ.  Has received several runs IV potassium  Bmet pending this am. -Follow metabolic panel, replace as needed   #Anion gap metabolic acidosis  Resolved. On admission, had AG of 22. Delta-delta ratio was about 2.5, indicating possible concomitant mild metabolic alkalosis. Likely in setting of acute seizure. Lactic acid normal.   #History of stroke  History of left subcortical infarct treated with TPA in March of 2013. Residual right-sided hemiparesis. On aspirin 325mg  daily.  No new focal neurologic deficits noted on exam. No evidence of new acute stroke on CT or MRI.  -Continue aspirin  #Essential hypertension, benign  On hydrochlorothiazide 25 mg daily. BP this am 119/77 -Continue HCTZ   #Restless leg syndrome  On ropinirole at home.  -Continue ropinirole  Dispo: Disposition is deferred at this time, awaiting improvement of current medical problems. Anticipated discharge in approximately 1-2 day(s).   The patient does have a current PCP Anna Mask, Anna Lyons), therefore will not be requiring OPC follow-up after discharge.   Services Needed at time of discharge: Y = Yes, Blank = No PT:   OT:   RN:   Equipment:   Other:     LOS: 1 day   Anna Lyons 05/20/2012, 8:39 AM

## 2012-05-20 NOTE — Progress Notes (Signed)
ANTICOAGULATION CONSULT NOTE - Follow Up Consult  Pharmacy Consult for Heparin Indication: chest pain/ACS  Allergies  Allergen Reactions  . Codeine Itching    Patient Measurements: Height: 5' 4.96" (165 cm) Weight: 144 lb 10 oz (65.6 kg) IBW/kg (Calculated) : 56.91 Heparin Dosing Weight: 65kg  Vital Signs: Temp: 98.4 F (36.9 C) (04/19 0433) Temp src: Oral (04/19 0433) BP: 119/77 mmHg (04/19 0433) Pulse Rate: 67 (04/19 0433)  Labs:  Recent Labs  05/19/12 0707 05/19/12 1235 05/19/12 1458 05/19/12 2005 05/19/12 2206 05/19/12 2209 05/20/12 0710  HGB 14.4  --   --   --   --   --  11.9*  HCT 40.4  --   --   --   --   --  34.3*  PLT 202  --   --   --   --   --  138*  HEPARINUNFRC  --   --   --   --   --  0.14* 0.31  CREATININE 0.61 0.60  --   --  0.65  --   --   CKTOTAL  --  125  --   --   --   --   --   CKMB  --  4.4*  --   --   --   --   --   TROPONINI 0.73*  --  1.18* 0.96*  --   --   --     Estimated Creatinine Clearance: 68.9 ml/min (by C-G formula based on Cr of 0.65).   Medications:  Heparin @ 1100 units/hr  Assessment: 57yof continues on heparin while ACS being ruled out. Troponins positive x3, but trending down. Hgb/Hct/Plts significantly decreased overnight. No bleeding reported.  Goal of Therapy:  Heparin level 0.3-0.7 units/ml Monitor platelets by anticoagulation protocol: Yes   Plan:  1) Increase heparin to 1300 units/hr 2) Follow up heparin level, CBC in AM 3) Watch platelets closely  Fredrik Rigger 05/20/2012,8:34 AM

## 2012-05-20 NOTE — Progress Notes (Signed)
Subjective: No further events since admission, normal EEG  Exam: Filed Vitals:   05/20/12 0433  BP: 119/77  Pulse: 67  Temp: 98.4 F (36.9 C)  Resp: 18   Gen: In bed, NAD MS: Awake, ALert, interactive and appropriate, gets year on orientation but misses month.  WU:JWJXB, VFF Motor: Limited right arm abduction(old 2/2 stroke) but otherwise 5/5 thorughout.  Sensory:intact to LT Gait: gets lightheaded when standing, so not formally tested.   Impression: 57 yo F with new onset seizures in the setting of lack of sleep. She has some juxtacortical ischemic changes on her MRI that I feel could be seizure foci, and the description given by ER staff and incontinence accompanying episode I feel that this liekly did represent seizure activity.   Recommendations: 1)Keppra 500mg  BID 2) Follow up with Dr. Pearlean Brownie as outpatient.  3) Patient is unable to drive, perform activities at heights,bath(showers ok) alone, or participate in water activities until release by outpatient physician. This was discussed with the patient who expressed understanding.  4) Neurology will sign off at this time, please call with any further questions.   Ritta Slot, MD [facility] [phone]  If 7pm- 7am, please page neurology on call at [phone].

## 2012-05-21 LAB — CBC
HCT: 31 % — ABNORMAL LOW (ref 36.0–46.0)
MCH: 32.7 pg (ref 26.0–34.0)
MCV: 95.7 fL (ref 78.0–100.0)
Platelets: 130 10*3/uL — ABNORMAL LOW (ref 150–400)
RDW: 12.6 % (ref 11.5–15.5)

## 2012-05-21 MED ORDER — ATORVASTATIN CALCIUM 80 MG PO TABS: 80.0000 mg | ORAL_TABLET | Freq: Every day | ORAL | Status: DC

## 2012-05-21 MED ORDER — LEVETIRACETAM 500 MG PO TABS: 500.0000 mg | ORAL_TABLET | Freq: Two times a day (BID) | ORAL | Status: DC

## 2012-05-21 NOTE — Discharge Summary (Signed)
Internal Medicine Teaching [facility] Discharge Note  Name: Anna Lyons MRN: [id number] DOB: 09-21-1955 57 y.o.  Date of Admission: 05/19/2012  6:59 AM Date of Discharge: 05/21/2012 Attending Physician: Jonah Blue, DO  Discharge Diagnosis: 1. New onset Seizure - witnessed seizure, started keppra 2. NSTEMI Type 2 3. Hypertension 4. History of stroke  Discharge Medications:   Medication List    TAKE these medications       aspirin EC 325 MG tablet  Take 325 mg by mouth daily.     atorvastatin 80 MG tablet  Commonly known as:  LIPITOR  Take 1 tablet (80 mg total) by mouth daily at 6 PM.     hydrochlorothiazide 25 MG tablet  Commonly known as:  HYDRODIURIL  Take 25 mg by mouth daily.     hydroxypropyl methylcellulose 2.5 % ophthalmic solution  Commonly known as:  ISOPTO TEARS  Place 1 drop into both eyes 3 (three) times daily as needed (for dry eyes).     ibuprofen 200 MG tablet  Commonly known as:  ADVIL,MOTRIN  Take 400 mg by mouth every 6 (six) hours as needed for pain or headache.     levETIRAcetam 500 MG tablet  Commonly known as:  KEPPRA  Take 1 tablet (500 mg total) by mouth 2 (two) times daily.     pantoprazole 40 MG tablet  Commonly known as:  PROTONIX  Take 40 mg by mouth daily as needed. indigestion     rOPINIRole 0.5 MG tablet  Commonly known as:  REQUIP  Take 0.5 mg by mouth at bedtime.     sertraline 100 MG tablet  Commonly known as:  ZOLOFT  Take 100 mg by mouth daily.     traMADol 50 MG tablet  Commonly known as:  ULTRAM  Take 50 mg by mouth every 6 (six) hours as needed for pain.        Disposition and follow-up:   [name] was discharged from [facility] in stable and improved condition, with no further occurrences of seizures.  Follow-up Appointments: Follow-up Information   Follow up with Gates Rigg, MD On 05/24/2012. (1:30 pm)    Contact information:   [address] [place]  [place] [address] [phone]       Follow up with Elyn Aquas., MD. (Call [phone] to schedule an outpatient Stress Test)    Contact information:   [address] [place] [address] [phone]       Follow up with Kaleen Mask, MD. Schedule an appointment as soon as possible for a visit in 2 weeks.   Contact information:   [address] [place] [address] [phone]       Consultations: Neurology Amada Jupiter), Cardiology (Nahser)  Procedures Performed:  Ct Head Wo Contrast   (if New Onset Seizure And/or Head Trauma)  05/19/2012  *RADIOLOGY REPORT*  Clinical Data: Seizure  CT HEAD WITHOUT CONTRAST  Technique:  Contiguous axial images were obtained from the base of the skull through the vertex without contrast.  Comparison: 11/16/2011  Findings: Dilated perivascular space within the right basal ganglia is unchanged from previous exam.  Old left basal ganglia infarct is similar to previous exam.  There is mild patchy low attenuation within the subcortical white matter identified bilaterally suggestive of chronic small vessel ischemic change.  The sulci and ventricular volumes appear within normal limits.  The midline is maintained. There is no evidence for acute brain infarct, hemorrhage or mass.  The mastoid air cells and paranasal sinuses appear clear.  The skull appears intact.  IMPRESSION:  1.  No acute intracranial abnormalities. 2.  Small vessel ischemic change. 3.  Chronic left basal ganglia lacunar infarcts.   Original Report Authenticated By: Signa Kell, M.D.    Mr Brain Wo Contrast  05/19/2012  *RADIOLOGY REPORT*  Clinical Data: 57 year old female with right head and night deviation followed by tonic clonic seizure activity.  No prior seizure history.  Found down. History of stroke.  MRI HEAD WITHOUT CONTRAST  Technique:  Multiplanar, multiecho pulse sequences of the brain and surrounding structures were obtained according to standard protocol without  intravenous contrast.  Comparison: Head CT 05/19/2012.  Brain MRI 11/16/2011 and earlier.  Findings: Stable cerebral volume. No restricted diffusion to suggest acute infarction.  No midline shift, mass effect, evidence of mass lesion, ventriculomegaly, extra-axial collection or acute intracranial hemorrhage.  Cervicomedullary junction and pituitary are within normal limits.  Major intracranial vascular flow voids are stable.  Chronic left corona radiata/external capsule lacunar infarct again noted.  Additional bilateral basal ganglia dilated perivascular spaces or other chronic lacunae.  Wallerian degeneration on the left.  No definite chronic blood products. Stable gray and white matter signal throughout the brain . No definite asymmetric signal in the hippocampal complexes; thin coronal images somewhat degraded by noise.  Negative visualized cervical spine.  Normal bone marrow signal. Visualized orbit soft tissues are within normal limits.  Stable paranasal sinuses and mastoids; small right mastoid effusion. Stable and negative visualized nasopharynx.  Grossly normal visualized internal auditory structures.  Negative scalp soft tissues.  IMPRESSION: Stable noncontrast MRI appearance of the brain with chronic small vessel ischemia greater on the left. No acute intracranial abnormality.   Original Report Authenticated By: Erskine Speed, M.D.    Dg Chest Portable 1 View  05/19/2012  *RADIOLOGY REPORT*  Clinical Data: Seizures  PORTABLE CHEST - 1 VIEW  Comparison: 11/16/2011  Findings: Cardiomediastinal silhouette is stable.  No acute infiltrate or pleural effusion.  No pulmonary edema.  Mild left basilar atelectasis.  IMPRESSION: No active disease.  Mild left basilar atelectasis.   Original Report Authenticated By: Natasha Mead, M.D.    2D Echo 05/21/11: - Left ventricle: The cavity size was normal. Wall thickness was normal. Systolic function was normal. The estimated ejection fraction was in the range of 60% to  65%. Wall motion was normal; there were no regional wall motion abnormalities. Doppler parameters are consistent with abnormal left ventricular relaxation (grade 1 diastolic dysfunction). - Aortic valve: Mildly calcified annulus. Trileaflet. - Mitral valve: Trivial regurgitation. - Left atrium: The atrium was at the upper limits of normal in size. - Right atrium: Central venous pressure: 12mm Hg (est). - Atrial septum: No defect or patent foramen ovale was identified. - Tricuspid valve: Trivial regurgitation. - Pulmonary arteries: Systolic pressure could not be accurately estimated. - Pericardium, extracardiac: A prominent pericardial fat pad was present.  Admission HPI:  Ms. Anna Lyons is a 57 year old female with past medical history of hypertension, CVA with residual right-sided hemiparesis, multiple TIAs, history of tobacco abuse presenting after witnessed seizure.  The patient's husband and is currently hospitalized the [facility]. She was found down near his room noted to be incontinent of stool and urine and shaking. She was brought to the ED and was noted to have right head and eye deviation followed by tonic-clonic seizure activity.  In the ED, she was given Ativan and Versed and seizure activity stopped.  She is somnolent with sedation on our evaluation but reports that she cannot remember what happened prior to coming to the emergency room. Her daughter is in the room and reports that her mother has not had a prior seizure that she is aware of.  According to family, the patient has been without sleep for over 24 hours while attending to her husband in the hospital.  In terms of other precipitants of possible seizure, patient is unable to engage in history taking to provide. Daughter reports that her mother does drink wine, and she's not sure, that. She  Patient also noted to have elevated troponin of 0.79. No history of heart disease per the daughter. From record review, it  appears that she had a clean cath one year ago.  Admission Physical Exam Blood pressure 133/104, pulse 79, temperature 100.9 F (38.3 C), temperature source Tympanic, resp. rate 16, height 5' 4.96" (1.65 m), weight 144 lb 10 oz (65.6 kg), SpO2 100.00%.  Vitals reviewed.  General: Patient is heavily sedated after Ativan and Versed. She's lying in bed breathing comfortably. She will arouse to answer simple questions and follow simple commands, but quickly falls back asleep.  HEENT: PERRL, EOMI, no scleral icterus  Cardiac: RRR, no rubs, murmurs or gallops  Pulm: clear to auscultation bilaterally, no wheezes, rales, or rhonchi  Abd: soft, nontender, nondistended, BS present  Ext: warm and well perfused, no pedal edema  Neuro:  Mental status: Somnolent and falling asleep after receiving Versed and Ativan. Able to follow simple commands and answer simple questions.  Cranial nerves: 2 through 12 appear intact  Motor: Patient unable to fully participate in exam. Spontaneously moves all 4 extremities  Reflexes: 2+ left patellar and biceps, 2+ right biceps and 3+ right patellar  Cerebellar: Too somnolent to participate in finger-nose-finger.  Gait: Not assessed.   Admission Labs Basic Metabolic Panel:   05/19/12 0707   NA  138   K  2.8*   CL  96   CO2  20   GLUCOSE  221*   BUN  9   CREATININE  0.61   CALCIUM  9.5   MG  1.6    CBC:   05/19/12 0707   WBC  10.5   HGB  14.4   HCT  40.4   MCV  94.0   PLT  202    Cardiac Enzymes:   05/19/12 0707   TROPONINI  0.73[facility] Course by problem list: 1. New onset Seizure - The patient presented after having 2 seizures, with associated incontinence.  Neurology was consulted.  MRI showed only evidence of a prior CVA, and EEG was unremarkable.  The patient's seizure was likely a combination of a lowered seizure threshold, due to sleep deprivation in the context of visiting her hospitalized husband, and a predisposition to seizures,  possibly related to her old CVA.  The patient was started on Keppra, with no further seizure activity during this hospitalization.  2. NSTEMI Type 2 - The patient presented with elevated troponins.  However, a cardiac cath performed 05/20/11 for chest pain showed no significant CAD.  Cardiology was consulted.  This episode is thought to represent a stress-mediated release of troponin, similar to Takotsubo syndrome.  Troponins were followed, and downtrended from a peak of 1.18 to 0.45 at the time of discharge.  The patient will follow-up with an outpatient stress test.  The patient was started on aspirin and atorvastatin.  Time spent on discharge: 45 minutes  Discharge Vitals:  BP 141/88  Pulse 58  Temp(Src) 97.8 F (36.6 C) (Oral)  Resp 18  Ht 5' 4.96" (1.65 m)  Wt 144 lb 10 oz (65.6 kg)  BMI 24.1 kg/m2  SpO2 97%  Discharge Labs:  Results for orders placed during the hospital encounter of 05/19/12 (from the past 24 hour(s))  TROPONIN I     Status: Abnormal   Collection Time    05/20/12  1:05 PM      Result Value Range   Troponin I 0.58 (*) <0.30 ng/mL  TROPONIN I     Status: None   Collection Time    05/20/12  6:50 PM      Result Value Range   Troponin I <0.30  <0.30 ng/mL  TROPONIN I     Status: Abnormal   Collection Time    05/21/12  1:23 AM      Result Value Range   Troponin I 0.45 (*) <0.30 ng/mL  CBC     Status: Abnormal   Collection Time    05/21/12  1:23 AM      Result Value Range   WBC 4.4  4.0 - 10.5 K/uL   RBC 3.24 (*) 3.87 - 5.11 MIL/uL   Hemoglobin 10.6 (*) 12.0 - 15.0 g/dL   HCT 11.9 (*) 14.7 - 82.9 %   MCV 95.7  78.0 - 100.0 fL   MCH 32.7  26.0 - 34.0 pg   MCHC 34.2  30.0 - 36.0 g/dL   RDW 56.2  13.0 - 86.5 %   Platelets 130 (*) 150 - 400 K/uL    Signed: Janalyn Harder 05/21/2012, 9:55 AM

## 2012-05-21 NOTE — Plan of Care (Signed)
Problem: Phase II Progression Outcomes Goal: Pain controlled Outcome: Progressing Has had severe headache upon awakening the last two AM

## 2012-05-21 NOTE — Care Management Note (Signed)
   CARE MANAGEMENT NOTE 05/21/2012  Patient:  Anna Lyons, Anna Lyons   Account Number:  [id number]  Date Initiated:  05/21/2012  Documentation initiated by:  Raiford Noble  Subjective/Objective Assessment:   new onset seizures     Action/Plan:   home with home health services- PT   Anticipated DC Date:  05/21/2012   Anticipated DC Plan:  HOME W HOME HEALTH SERVICES  In-house referral  NA      DC Planning Services  CM consult      [facility] Choice  HOME HEALTH   Choice offered to / List presented to:  C-4 Adult Children   DME arranged  NA      DME agency  NA     HH arranged  HH-2 PT      HH agency  [facility]   Status of service:  Completed, signed off Medicare Important Message given?   (If response is "NO", the following Medicare IM given date fields will be blank) Date Medicare IM given:   Date Additional Medicare IM given:    Discharge Disposition:  HOME W HOME HEALTH SERVICES  Per UR Regulation:    If discussed at Long Length of Stay Meetings, dates discussed:    Comments:  05/21/12 Anna Lyons [phone] Received call from patient's nurse stating patient has home health PT ordered. Spoke with patient's daughter in room and they chose [facility]. Called Jamie with [facility] to make aware of referral. She was able to obtain orders thru Epic. Gave daughter [facility] number in case she was discharged prior to this writer entering [facility] contact information on discharge paper work. No further needs assessed.

## 2012-05-21 NOTE — Progress Notes (Signed)
CRITICAL VALUE ALERT  Critical value received:  Trop 0.45  Date of notification:  05/21/12  Time of notification:  0315  Critical value read back:yes  Nurse who received alert:  Laurence Compton RN  MD notified (1st page):  McTyre  Time of first page:  0315  MD notified (2nd page):  Time of second page:  Responding MD:  McTyre  Time MD responded:  1610

## 2012-05-21 NOTE — Progress Notes (Signed)
Subjective: The patient notes a headache this morning, though states it is improving.  No chest pain, no further seizure activity.  Echo performed yesterday, awaiting read.  Objective: Vital signs in last 24 hours: Filed Vitals:   05/20/12 0433 05/20/12 1352 05/20/12 2100 05/21/12 0513  BP: 119/77 119/80 136/84 141/88  Pulse: 67 70 80 58  Temp: 98.4 F (36.9 C) 98.4 F (36.9 C) 98.3 F (36.8 C) 97.8 F (36.6 C)  TempSrc: Oral Oral Oral Oral  Resp: 18 18 18 18   Height:      Weight:      SpO2: 95% 96% 95% 97%   Weight change:   Intake/Output Summary (Last 24 hours) at 05/21/12 0824 Last data filed at 05/21/12 0336  Gross per 24 hour  Intake    240 ml  Output    401 ml  Net   -161 ml   Vitals reviewed. General: sitting up in bed, tearful HEENT: PERRL, EOMI, no scleral icterus Cardiac: RRR, no rubs, murmurs or gallops Pulm: clear to auscultation bilaterally, no wheezes, rales, or rhonchi Abd: soft, nontender, nondistended, BS present Ext: warm and well perfused, no pedal edema Neuro: alert and oriented X3, cranial nerves II-XII grossly intact, some decreased strength in R arm from old CVA, sensation intact to light touch bilateral extremities.  Lab Results: Basic Metabolic Panel:  Recent Labs Lab 05/19/12 0707  05/19/12 2206 05/20/12 0710  NA 138  < > 137 138  K 2.8*  < > 3.0* 3.9  CL 96  < > 101 105  CO2 20  < > 28 26  GLUCOSE 221*  < > 93 90  BUN 9  < > 9 8  CREATININE 0.61  < > 0.65 0.60  CALCIUM 9.5  < > 8.4 8.3*  MG 1.6  --  1.8  --   < > = values in this interval not displayed. Liver Function Tests:  Recent Labs Lab 05/19/12 1235  AST 27  ALT 15  ALKPHOS 62  BILITOT 0.5  PROT 7.0  ALBUMIN 3.6   CBC:  Recent Labs Lab 05/20/12 0710 05/21/12 0123  WBC 5.3 4.4  HGB 11.9* 10.6*  HCT 34.3* 31.0*  MCV 96.1 95.7  PLT 138* 130*   Cardiac Enzymes:  Recent Labs Lab 05/19/12 1235  05/20/12 1305 05/20/12 1850 05/21/12 0123  CKTOTAL 125  --    --   --   --   CKMB 4.4*  --   --   --   --   TROPONINI  --   < > 0.58* <0.30 0.45*  < > = values in this interval not displayed. CBG:  Recent Labs Lab 05/19/12 0751  GLUCAP 163*   Fasting Lipid Panel:  Recent Labs Lab 05/19/12 1235  CHOL 152  HDL 50  LDLCALC 83  TRIG 95  CHOLHDL 3.0   Thyroid Function Tests:  Recent Labs Lab 05/19/12 1458  TSH 1.778   Urine Drug Screen: Drugs of Abuse     Component Value Date/Time   LABOPIA NONE DETECTED 11/17/2011 1459   COCAINSCRNUR NONE DETECTED 11/17/2011 1459   LABBENZ NONE DETECTED 11/17/2011 1459   AMPHETMU NONE DETECTED 11/17/2011 1459   THCU NONE DETECTED 11/17/2011 1459   LABBARB NONE DETECTED 11/17/2011 1459    Urinalysis:  Recent Labs Lab 05/19/12 0920  COLORURINE YELLOW  LABSPEC 1.017  PHURINE 6.0  GLUCOSEU NEGATIVE  HGBUR NEGATIVE  BILIRUBINUR NEGATIVE  KETONESUR 15*  PROTEINUR NEGATIVE  UROBILINOGEN 0.2  NITRITE NEGATIVE  LEUKOCYTESUR NEGATIVE   Studies/Results: Ct Head Wo Contrast   (if New Onset Seizure And/or Head Trauma)  05/19/2012  *RADIOLOGY REPORT*  Clinical Data: Seizure  CT HEAD WITHOUT CONTRAST  Technique:  Contiguous axial images were obtained from the base of the skull through the vertex without contrast.  Comparison: 11/16/2011  Findings: Dilated perivascular space within the right basal ganglia is unchanged from previous exam.  Old left basal ganglia infarct is similar to previous exam.  There is mild patchy low attenuation within the subcortical white matter identified bilaterally suggestive of chronic small vessel ischemic change.  The sulci and ventricular volumes appear within normal limits.  The midline is maintained. There is no evidence for acute brain infarct, hemorrhage or mass. The mastoid air cells and paranasal sinuses appear clear.  The skull appears intact.  IMPRESSION:  1.  No acute intracranial abnormalities. 2.  Small vessel ischemic change. 3.  Chronic left basal ganglia  lacunar infarcts.   Original Report Authenticated By: Signa Kell, M.D.    Mr Brain Wo Contrast  05/19/2012  *RADIOLOGY REPORT*  Clinical Data: 57 year old female with right head and night deviation followed by tonic clonic seizure activity.  No prior seizure history.  Found down. History of stroke.  MRI HEAD WITHOUT CONTRAST  Technique:  Multiplanar, multiecho pulse sequences of the brain and surrounding structures were obtained according to standard protocol without intravenous contrast.  Comparison: Head CT 05/19/2012.  Brain MRI 11/16/2011 and earlier.  Findings: Stable cerebral volume. No restricted diffusion to suggest acute infarction.  No midline shift, mass effect, evidence of mass lesion, ventriculomegaly, extra-axial collection or acute intracranial hemorrhage.  Cervicomedullary junction and pituitary are within normal limits.  Major intracranial vascular flow voids are stable.  Chronic left corona radiata/external capsule lacunar infarct again noted.  Additional bilateral basal ganglia dilated perivascular spaces or other chronic lacunae.  Wallerian degeneration on the left.  No definite chronic blood products. Stable gray and white matter signal throughout the brain . No definite asymmetric signal in the hippocampal complexes; thin coronal images somewhat degraded by noise.  Negative visualized cervical spine.  Normal bone marrow signal. Visualized orbit soft tissues are within normal limits.  Stable paranasal sinuses and mastoids; small right mastoid effusion. Stable and negative visualized nasopharynx.  Grossly normal visualized internal auditory structures.  Negative scalp soft tissues.  IMPRESSION: Stable noncontrast MRI appearance of the brain with chronic small vessel ischemia greater on the left. No acute intracranial abnormality.   Original Report Authenticated By: Erskine Speed, M.D.    Dg Chest Portable 1 View  05/19/2012  *RADIOLOGY REPORT*  Clinical Data: Seizures  PORTABLE CHEST - 1  VIEW  Comparison: 11/16/2011  Findings: Cardiomediastinal silhouette is stable.  No acute infiltrate or pleural effusion.  No pulmonary edema.  Mild left basilar atelectasis.  IMPRESSION: No active disease.  Mild left basilar atelectasis.   Original Report Authenticated By: Natasha Mead, M.D.    Medications: I have reviewed the patient's current medications. Scheduled Meds: . aspirin EC  325 mg Oral Daily  . atorvastatin  80 mg Oral q1800  . heparin subcutaneous  5,000 Units Subcutaneous Q8H  . levETIRAcetam  500 mg Oral BID  . LORazepam  1 mg Intravenous Once  . sertraline  50 mg Oral Daily  . sodium chloride  3 mL Intravenous Q12H   Continuous Infusions: . sodium chloride 1,000 mL (05/20/12 1905)   PRN Meds:.acetaminophen, acetaminophen, ondansetron (ZOFRAN) IV, ondansetron  Assessment/Plan: Anna Lyons is a 57 year old  woman who presents for admission after being found down incontinent of stool and urine and later witnessed to have secondary generalized tonic-clonic seizure.   #Seizure  Discussed case with Dr. Amada Jupiter, neurologist. Patient has no evidence of new acute stroke on MRI and no other metabolic abnormalities to explain her seizure. Dr. Amada Jupiter believes that old juxtacortical ischemic changes and sleep deprivation precipitated acute seizure. - Plan for Keppra 500 mg twice a day, followup with Dr. Pearlean Brownie as an outpatient. -Discussed driving, activity restrictions with patient  #Elevated troponin  Noted to have troponin peak of 1.18, which has downtrended to 0.45. Has a history of chest pain in the past and a workup with a cardiac catheterization which was negative for any coronary artery disease one year ago.  Cardiology was consulted, concern for sympathetic nervous system "surge" in the setting of the seizures leading to elevated troponin.  -Echo performed, result pending - plan for outpatient stress test per cardiology  #Anion gap metabolic acidosis  Resolved. On  admission, had AG of 22. Delta-delta ratio was about 2.5, indicating possible concomitant mild metabolic alkalosis. Likely in setting of acute seizure. Lactic acid normal.   #History of stroke  History of left subcortical infarct treated with TPA in March of 2013. Residual right-sided hemiparesis. On aspirin 325mg  daily.  No new focal neurologic deficits noted on exam. No evidence of new acute stroke on CT or MRI.  -Continue aspirin  #Essential hypertension, benign  On hydrochlorothiazide 25 mg daily. BP this am 141/88 -Continue HCTZ   #Restless leg syndrome  On ropinirole at home.  -Continue ropinirole  Dispo: Awaiting read from echocardiogram, then can likely discharge to home today.  The patient does have a current PCP Kaleen Mask, MD), therefore will not be requiring OPC follow-up after discharge.   Services Needed at time of discharge: Y = Yes, Blank = No PT: HHPT  OT:   RN:   Equipment:   Other:     LOS: 2 days   Anna Lyons 05/21/2012, 8:24 AM

## 2012-05-22 ENCOUNTER — Encounter: Payer: Self-pay | Admitting: *Deleted

## 2012-05-22 ENCOUNTER — Telehealth: Payer: Self-pay | Admitting: *Deleted

## 2012-05-22 DIAGNOSIS — I2510 Atherosclerotic heart disease of native coronary artery without angina pectoris: Secondary | ICD-10-CM

## 2012-05-22 DIAGNOSIS — I214 Non-ST elevation (NSTEMI) myocardial infarction: Secondary | ICD-10-CM

## 2012-05-22 NOTE — Discharge Summary (Signed)
INTERNAL MEDICINE TEACHING SERVICE Attending Note  Date: 05/22/2012  Patient name: Anna Lyons  Medical record number: [id number]  Date of birth: 12/06/1955   This patient has been discussed with the house staff. Please see their note for complete details. I concur with their findings and plan.  The treatment plan was discussed in detail with the patient.  Alternatives to treatment, side effects, risks and benefits, and complications were discussed with the patient. Informed consent was obtained. The patient agrees to proceed with the current treatment plan.  Jonah Blue, DO  05/22/2012, 9:15 AM

## 2012-05-22 NOTE — Telephone Encounter (Signed)
Per Marlowe Kays in scheduling pt was seen in ED, needs stress myo ordered. Order completed and sent to [facility].

## 2012-05-24 ENCOUNTER — Ambulatory Visit: Payer: Self-pay | Admitting: Neurology

## 2012-05-29 ENCOUNTER — Ambulatory Visit (HOSPITAL_COMMUNITY): Payer: Medicaid Other | Attending: Cardiology | Admitting: Radiology

## 2012-05-29 VITALS — BP 112/68 | Ht 66.0 in | Wt 146.0 lb

## 2012-05-29 DIAGNOSIS — R5383 Other fatigue: Secondary | ICD-10-CM | POA: Insufficient documentation

## 2012-05-29 DIAGNOSIS — Z8673 Personal history of transient ischemic attack (TIA), and cerebral infarction without residual deficits: Secondary | ICD-10-CM | POA: Insufficient documentation

## 2012-05-29 DIAGNOSIS — R0609 Other forms of dyspnea: Secondary | ICD-10-CM | POA: Insufficient documentation

## 2012-05-29 DIAGNOSIS — R0989 Other specified symptoms and signs involving the circulatory and respiratory systems: Secondary | ICD-10-CM | POA: Insufficient documentation

## 2012-05-29 DIAGNOSIS — R5381 Other malaise: Secondary | ICD-10-CM | POA: Insufficient documentation

## 2012-05-29 DIAGNOSIS — R0602 Shortness of breath: Secondary | ICD-10-CM | POA: Insufficient documentation

## 2012-05-29 DIAGNOSIS — Z87891 Personal history of nicotine dependence: Secondary | ICD-10-CM | POA: Insufficient documentation

## 2012-05-29 DIAGNOSIS — I252 Old myocardial infarction: Secondary | ICD-10-CM | POA: Insufficient documentation

## 2012-05-29 DIAGNOSIS — I214 Non-ST elevation (NSTEMI) myocardial infarction: Secondary | ICD-10-CM

## 2012-05-29 DIAGNOSIS — I10 Essential (primary) hypertension: Secondary | ICD-10-CM | POA: Insufficient documentation

## 2012-05-29 DIAGNOSIS — I2510 Atherosclerotic heart disease of native coronary artery without angina pectoris: Secondary | ICD-10-CM

## 2012-05-29 DIAGNOSIS — R9431 Abnormal electrocardiogram [ECG] [EKG]: Secondary | ICD-10-CM

## 2012-05-29 MED ORDER — TECHNETIUM TC 99M SESTAMIBI GENERIC - CARDIOLITE
10.0000 | Freq: Once | INTRAVENOUS | Status: AC | PRN
  Administered 2012-05-29: 10 via INTRAVENOUS

## 2012-05-29 MED ORDER — TECHNETIUM TC 99M SESTAMIBI GENERIC - CARDIOLITE
30.0000 | Freq: Once | INTRAVENOUS | Status: AC | PRN
  Administered 2012-05-29: 30 via INTRAVENOUS

## 2012-05-29 MED ORDER — REGADENOSON 0.4 MG/5ML IV SOLN
0.4000 mg | Freq: Once | INTRAVENOUS | Status: AC
  Administered 2012-05-29: 0.4 mg via INTRAVENOUS

## 2012-05-29 NOTE — Progress Notes (Signed)
[facility] SITE 3 NUCLEAR MED [address] [place], [place] [address] [phone]    Cardiology Nuclear Med Study  Anna Lyons is a 57 y.o. female     MRN : [id number]     DOB: 02/07/1955  Procedure Date: 05/29/2012  Nuclear Med Background Indication for Stress Test:  Evaluation for Ischemia and Post [facility] 05/19/12 for seizure/NSTEMI type 2 (?Takotsubo) History:  2013 Heart Cath: Nl Coronaries EF: 65%, 05/19/12 MI-NSTEMI( Takotsubo), 05/20/12 ECHO: EF: 60-65% Cardiac Risk Factors: CVA, History of Smoking, Hypertension and TIA  Symptoms:  DOE, Fatigue and SOB   Nuclear Pre-Procedure Caffeine/Decaff Intake:  None NPO After: 3:00PM   Lungs:  clear O2 Sat: 95% on room air. IV 0.9% NS with Angio Cath:  22g  IV Site: L Hand  IV Started by:  Doyne Keel, CNMT  Chest Size (in):  34 Cup Size: B (bilat implants)  Height: 5\' 6"  (1.676 m)  Weight:  146 lb (66.225 kg)  BMI:  Body mass index is 23.58 kg/(m^2). Tech Comments:  Took all am meds    Nuclear Med Study 1 or 2 day study: 1 day  Stress Test Type:  Lexiscan  Reading MD: Olga Millers, MD  Order Authorizing Provider:  Katherina Right, MD  Resting Radionuclide: Technetium 48m Sestamibi  Resting Radionuclide Dose: 11.0 mCi   Stress Radionuclide:  Technetium 36m Sestamibi  Stress Radionuclide Dose: 32.9 mCi           Stress Protocol Rest HR: 78 Stress HR: 89  Rest BP: 112/68 Stress BP: 140/76  Exercise Time (min): n/a METS: n/a   Predicted Max HR: 162 bpm % Max HR: 54.94 bpm Rate Pressure Product: 27253   Dose of Adenosine (mg):  n/a Dose of Lexiscan: 0.4 mg  Dose of Atropine (mg): n/a Dose of Dobutamine: n/a mcg/kg/min (at max HR)  Stress Test Technologist: Milana Na, EMT-P  Nuclear Technologist:  Domenic Polite, CNMT     Rest Procedure:  Myocardial perfusion imaging was performed at rest 45 minutes following the intravenous administration of Technetium 56m Sestamibi. Rest ECG: NSR, PRWP, prolonged QT,  nonspecific ST changes.  Stress Procedure:  The patient received IV Lexiscan 0.4 mg over 15-seconds.  Technetium 38m Sestamibi injected at 30-seconds. This patient had nausea with the Lexiscan injection. Quantitative spect images were obtained after a 45 minute delay. Stress ECG: No significant ST segment change suggestive of ischemia.  QPS Raw Data Images:  Acquisition technically good; normal left ventricular size. Stress Images:  Normal homogeneous uptake in all areas of the myocardium. Rest Images:  Normal homogeneous uptake in all areas of the myocardium. Subtraction (SDS):  No evidence of ischemia. Transient Ischemic Dilatation (Normal <1.22):  1.14 Lung/Heart Ratio (Normal <0.45):  0.37  Quantitative Gated Spect Images QGS EDV:  90 ml QGS ESV:  35 ml  Impression Exercise Capacity:  Lexiscan with no exercise. BP Response:  Normal blood pressure response. Clinical Symptoms:  No chest pain or dyspnea ECG Impression:  No significant ST segment change suggestive of ischemia. Comparison with Prior Nuclear Study: No images to compare  Overall Impression:  Normal stress nuclear study.  LV Ejection Fraction: 61%.  LV Wall Motion:  NL LV Function; NL Wall Motion  Olga Millers

## 2012-08-09 ENCOUNTER — Ambulatory Visit: Payer: Self-pay | Admitting: Nurse Practitioner

## 2012-08-28 ENCOUNTER — Encounter: Payer: Self-pay | Admitting: Nurse Practitioner

## 2012-08-28 ENCOUNTER — Ambulatory Visit (INDEPENDENT_AMBULATORY_CARE_PROVIDER_SITE_OTHER): Payer: Self-pay | Admitting: Nurse Practitioner

## 2012-08-28 VITALS — BP 139/90 | HR 76 | Ht 63.0 in | Wt 143.0 lb

## 2012-08-28 DIAGNOSIS — F329 Major depressive disorder, single episode, unspecified: Secondary | ICD-10-CM

## 2012-08-28 DIAGNOSIS — F3289 Other specified depressive episodes: Secondary | ICD-10-CM

## 2012-08-28 DIAGNOSIS — I6350 Cerebral infarction due to unspecified occlusion or stenosis of unspecified cerebral artery: Secondary | ICD-10-CM

## 2012-08-28 DIAGNOSIS — I639 Cerebral infarction, unspecified: Secondary | ICD-10-CM

## 2012-08-28 DIAGNOSIS — Z720 Tobacco use: Secondary | ICD-10-CM

## 2012-08-28 DIAGNOSIS — F17200 Nicotine dependence, unspecified, uncomplicated: Secondary | ICD-10-CM

## 2012-08-28 DIAGNOSIS — G8191 Hemiplegia, unspecified affecting right dominant side: Secondary | ICD-10-CM

## 2012-08-28 DIAGNOSIS — F32A Depression, unspecified: Secondary | ICD-10-CM

## 2012-08-28 DIAGNOSIS — G2581 Restless legs syndrome: Secondary | ICD-10-CM

## 2012-08-28 DIAGNOSIS — G8190 Hemiplegia, unspecified affecting unspecified side: Secondary | ICD-10-CM

## 2012-08-28 MED ORDER — ROPINIROLE HCL 0.5 MG PO TABS: 0.5000 mg | ORAL_TABLET | Freq: Every day | ORAL | Status: DC

## 2012-08-28 MED ORDER — LEVETIRACETAM 500 MG PO TABS: 500.0000 mg | ORAL_TABLET | Freq: Two times a day (BID) | ORAL | Status: DC

## 2012-08-28 NOTE — Progress Notes (Signed)
[facility]  PATIENT: Anna Lyons DOB: 05-04-1955   HISTORY FROM: patient, chart REASON FOR VISIT: Routine followup  HISTORY OF PRESENT ILLNESS:  UPDATE 08/28/12 (LL):  Anna Lyons returns to office for follow up of seizures (2) which occurred on 05/19/12.  They occurred while patient was staying with sick husband in hospital, was sleep deprived and stressed out.  Husband subsequently died one month ago.  Patient has not had anymore seizures, is maintained on Keppra 500mg  BID.  No new neurovascular symptoms.  Not sleeping well per daughter, patient has been very depressed since husband's death.  Zoloft was increased by PCP.  RLS symptoms worse, needs refill on Requip.  Dr. Jeannetta Nap manages HTN and cholesterol, patient does not know last lab values.  Daughter reports BP med was recently changed due to hypokalemia.  Patient reportedly stating smoking again when husband was terminal with cancer.  Plans on quitting asap. Patient c/o right shoulder pain, weak from stroke and exacerbated by moving/carrying boxes.  Anna Lyons is a 57 y.o. 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 The first happened on the floor of the hospital that she was visiting her husband who was a patient. She is staying with him and was incontinent of both stool and urine and had what was reported to be should shaking activity.  She was then taken to the emergency room where she was witnessed to have right head and eye deviation followed by tonic-clonic activity.  She was given Ativan and Versed.  MRI showed only evidence of a prior CVA, and EEG was unremarkable. The patient's seizure was likely a combination of a lowered seizure threshold, due to sleep deprivation in the context of visiting her hospitalized husband, and a predisposition to seizures, possibly related  to her old CVA. The patient was started on Keppra, with no further seizure activity during the hospitalization.    02/17/12 PRIOR HPI (PS): 57 year old female was last seen in hospital for stroke or March of 2013. At that time she received TPA. Workup showed normal carotid Dopplers, 2-D echo showed 55-60% EF, negative CT angiogram extracranial vessels, negative CT brain, and positive stroke in the left posterior lentiform nucleus seen on MRI. She was discharged on aspirin 325 mg at that time. She was doing well until 11/16/2011 a.m. She awoke at 8 AM in normal state. She ate breakfast at 10:30 and noted she had difficulty chewing her food. She brushed her teeth at 11 AM and noticed a right facial droop. No other symptoms. Initial CT head showed no acute infarct or bleed. Facial droop was still present. During further exam her facial droop improved and she is now back to her baseline. Initial NIH SS of 2 for right facial droop and pronator drift. CT of the brain 11/16/2011 remote left basal ganglia lacunar infarction. Mild white matter changes as above. No definite CT evidence for acute intracranial abnormality. MRI of the brain [facility] 15 2013 chronic left hemisphere infarction as described. Small foci of restricted diffusion in the left periventricular region could represent acute or chronic ischemia. Mild atrophy and mild chronic microvascular ischemic change. Chest x-ray 11/16/2011 cardiomegaly without congestive failure.  REVIEW OF SYSTEMS: Full 14 system review of systems performed and notable only for: constitutional: fatigue cardiovascular: N/A respiratory: cough endocrine: N/A  ear/nose/throat: N/A  musculoskeletal: joint pain skin: N/A genitourinary:  N/A Gastrointestinal: N/A allergy/immunology: N/A neurological: memory loss, confusion, headache sleep: insomnia, restless legs, sleepiness, snoring psychiatric: sepression, anxiety, change in appetitie   ALLERGIES: Allergies  Allergen  Reactions  . Codeine Itching    HOME MEDICATIONS: Outpatient Prescriptions Prior to Visit  Medication Sig Dispense Refill  . aspirin EC 325 MG tablet Take 325 mg by mouth daily.      [name] atorvastatin (LIPITOR) 80 MG tablet Take 1 tablet (80 mg total) by mouth daily at 6 PM.  30 tablet  1  . ibuprofen (ADVIL,MOTRIN) 200 MG tablet Take 400 mg by mouth every 6 (six) hours as needed for pain or headache.      . levETIRAcetam (KEPPRA) 500 MG tablet Take 1 tablet (500 mg total) by mouth 2 (two) times daily.  60 tablet  2  . pantoprazole (PROTONIX) 40 MG tablet Take 40 mg by mouth daily as needed. indigestion      . sertraline (ZOLOFT) 100 MG tablet Take 100 mg by mouth daily.      . traMADol (ULTRAM) 50 MG tablet Take 50 mg by mouth every 6 (six) hours as needed for pain.      [name] rOPINIRole (REQUIP) 0.5 MG tablet Take 0.5 mg by mouth at bedtime.      . hydrochlorothiazide (HYDRODIURIL) 25 MG tablet Take 25 mg by mouth daily.      . hydroxypropyl methylcellulose (ISOPTO TEARS) 2.5 % ophthalmic solution Place 1 drop into both eyes 3 (three) times daily as needed (for dry eyes).       No facility-administered medications prior to visit.    PAST MEDICAL HISTORY: Past Medical History  Diagnosis Date  . Depression   . Hypertension   . Angina   . Restless leg syndrome   . TIA (transient ischemic attack) 08/2010  . Stroke 04/20/2011    a. 04/20/11 Left subcortical infarct treated w/ TPA;  b. 04/2011 Echo: EF 55-60%;  c. 04/2011 Normal Carotid u/s  d. Residual "walk w/limp on right; unable to grasp w/right hand"  . Tobacco abuse     a. quit @ time of CVA 04/2011.  [name] RLS (restless legs syndrome) 11/16/2011  . Seizures     PAST SURGICAL HISTORY: Past Surgical History  Procedure Laterality Date  . Mastectomy  1980's    bilateral with reconstruction  . Breast reconstruction      bilaterally  . Cesarean section  1979  . Knee arthroscopy  1990's    left; cartilage repair  . Carpal tunnel release   1990's    left    FAMILY HISTORY: Family History  Problem Relation Age of Onset  . Lupus Mother     died early 34's.  . Emphysema Father     died late 34's.  [name] COPD Father     SOCIAL HISTORY: History   Social History  . Marital Status: Married    Spouse Name: N/A    Number of Children: N/A  . Years of Education: N/A   Occupational History  . Not on file.   Social History Main Topics  . Smoking status: Current smoker -- 0.50 packs/day for 37 years    Types: Cigarettes    Quit date: 04/20/2011, restarted May '14  . Smokeless tobacco: Never Used       . Alcohol Use: No  . Drug Use: No  . Sexually Active: No   Other Topics Concern  . Not on file   Social History Narrative   Lives @ home  locally. Widow.     PHYSICAL EXAM  Filed Vitals:   08/28/12 1349  BP: 139/90  Pulse: 76  Height: 5\' 3"  (1.6 m)  Weight: 143 lb (64.864 kg)   Body mass index is 25.34 kg/(m^2).  Generalized: In no acute distress, tearful  Neck: Supple, no carotid bruits   Cardiac: Regular rate rhythm, no murmur   Pulmonary: Clear to auscultation bilaterally   Musculoskeletal: No deformity   Neurological examination   Mentation: Alert oriented to time, place, history taking, language fluent, and casual conversation  Cranial nerve II-XII: Pupils were equal round reactive to light extraocular movements were full, visual field were full on confrontational test. facial sensation and strength were normal. hearing was intact to finger rubbing bilaterally. Uvula tongue midline. head turning and shoulder shrug and were normal and symmetric.Tongue protrusion into cheek strength was normal. MOTOR: Normal strength, tone, reflexes and coordination on the left. Mild right upper and lower extremity drift with 4/5 strength weakness of right grip and intrinsic hand muscles. SENSORY: normal and symmetric to light touch, temperature, vibration COORDINATION: finger-nose-finger, heel-to-shin bilaterally,  there was no truncal ataxia REFLEXES: 2+ and symmetric, plantar responses were flexor bilaterally. GAIT/STATION: Rising up from seated position without assistance, normal stance, without trunk ataxia, moderate stride, good arm swing, smooth turning, able to perform tiptoe, and heel walking without difficulty.    DIAGNOSTIC DATA (LABS, IMAGING, TESTING) - I reviewed patient records, labs, notes, testing and imaging myself where available.  Lab Results  Component Value Date   WBC 4.4 05/21/2012   HGB 10.6* 05/21/2012   HCT 31.0* 05/21/2012   MCV 95.7 05/21/2012   PLT 130* 05/21/2012      Component Value Date/Time   NA 138 05/20/2012 0710   K 3.9 05/20/2012 0710   CL 105 05/20/2012 0710   CO2 26 05/20/2012 0710   GLUCOSE 90 05/20/2012 0710   BUN 8 05/20/2012 0710   CREATININE 0.60 05/20/2012 0710   CALCIUM 8.3* 05/20/2012 0710   PROT 7.0 05/19/2012 1235   ALBUMIN 3.6 05/19/2012 1235   AST 27 05/19/2012 1235   ALT 15 05/19/2012 1235   ALKPHOS 62 05/19/2012 1235   BILITOT 0.5 05/19/2012 1235   GFRNONAA >90 05/20/2012 0710   GFRAA >90 05/20/2012 0710   Lab Results  Component Value Date   CHOL 152 05/19/2012   HDL 50 05/19/2012   LDLCALC 83 05/19/2012   TRIG 95 05/19/2012   CHOLHDL 3.0 05/19/2012   Lab Results  Component Value Date   HGBA1C 5.7* 11/17/2011   No results found for this basename: VITAMINB12   Lab Results  Component Value Date   TSH 1.778 05/19/2012   Ct Head Wo Contrast (if New Onset Seizure And/or Head Trauma)  05/19/2012  No acute intracranial abnormalities. Small vessel ischemic change. Chronic left basal ganglia lacunar infarcts.  Mr Brain Wo Contrast  05/19/2012 Stable noncontrast MRI appearance of the brain with chronic small vessel ischemia greater on the left. No acute intracranial abnormality.  Dg Chest Portable 1 View  05/19/2012 No active disease. Mild left basilar atelectasis.    ASSESSMENT AND PLAN Anna Lyons is a 57 y.o. female with a history of previous stroke  04/20/11 with residual right-sided hemiparesis, multiple TIAs, history of tobacco abuse and presented with 2 episodes of seizure on 05/19/12. The patient's seizure was likely a combination of a lowered seizure threshold, due to sleep deprivation in the context of visiting her hospitalized husband, and a predisposition to seizures, possibly  related to her old CVA. The patient was started on Keppra.  Continue Keppra 500 mg BID. Under [place] Law, may not drive for 6 months from last seizure; discussed with patient. Continue Requip for RLS, Refill #3, #90 tabs.  Continue aspirin 325 mg orally every day  for secondary stroke prevention and maintain strict control of hypertension with blood pressure goal below 130/90, diabetes with hemoglobin A1c goal below 6.5% and lipids with LDL cholesterol goal below 100 mg/dL.  Recommended patient stop smoking.   Followup in 6 months.   Zaylin Runco NP-C 08/28/2012, 5:04 PM  [facility] [address] [place], [place] [address] [phone]  I have personally examined this patient, reviewed pertinent data, developed plan of care and discussed with patient and agree with above.  Delia Heady, MD

## 2012-08-28 NOTE — Patient Instructions (Signed)
Continue Keppra 500 mg BID. Continue Requip for RLS, Refill #3, #90 tabs.  Continue aspirin 325 mg orally every day  for secondary stroke prevention and maintain strict control of hypertension with blood pressure goal below 130/90, diabetes with hemoglobin A1c goal below 6.5% and lipids with LDL cholesterol goal below 100 mg/dL.   Followup in 6 months.

## 2012-10-22 ENCOUNTER — Emergency Department (HOSPITAL_COMMUNITY)
Admission: EM | Admit: 2012-10-22 | Discharge: 2012-10-22 | Disposition: A | Discharge status: Home / Self Care | Payer: Medicaid Other | Attending: Emergency Medicine | Admitting: Emergency Medicine

## 2012-10-22 ENCOUNTER — Emergency Department (HOSPITAL_COMMUNITY): Payer: Medicaid Other

## 2012-10-22 DIAGNOSIS — S20211A Contusion of right front wall of thorax, initial encounter: Secondary | ICD-10-CM

## 2012-10-22 DIAGNOSIS — Z7982 Long term (current) use of aspirin: Secondary | ICD-10-CM | POA: Insufficient documentation

## 2012-10-22 DIAGNOSIS — Z79899 Other long term (current) drug therapy: Secondary | ICD-10-CM | POA: Insufficient documentation

## 2012-10-22 DIAGNOSIS — R569 Unspecified convulsions: Secondary | ICD-10-CM | POA: Insufficient documentation

## 2012-10-22 DIAGNOSIS — F17200 Nicotine dependence, unspecified, uncomplicated: Secondary | ICD-10-CM | POA: Insufficient documentation

## 2012-10-22 DIAGNOSIS — I69959 Hemiplegia and hemiparesis following unspecified cerebrovascular disease affecting unspecified side: Secondary | ICD-10-CM | POA: Insufficient documentation

## 2012-10-22 DIAGNOSIS — F329 Major depressive disorder, single episode, unspecified: Secondary | ICD-10-CM | POA: Insufficient documentation

## 2012-10-22 DIAGNOSIS — R296 Repeated falls: Secondary | ICD-10-CM | POA: Insufficient documentation

## 2012-10-22 DIAGNOSIS — S20219A Contusion of unspecified front wall of thorax, initial encounter: Secondary | ICD-10-CM | POA: Insufficient documentation

## 2012-10-22 DIAGNOSIS — Z885 Allergy status to narcotic agent status: Secondary | ICD-10-CM | POA: Insufficient documentation

## 2012-10-22 DIAGNOSIS — I10 Essential (primary) hypertension: Secondary | ICD-10-CM | POA: Insufficient documentation

## 2012-10-22 DIAGNOSIS — Y92009 Unspecified place in unspecified non-institutional (private) residence as the place of occurrence of the external cause: Secondary | ICD-10-CM | POA: Insufficient documentation

## 2012-10-22 DIAGNOSIS — F3289 Other specified depressive episodes: Secondary | ICD-10-CM | POA: Insufficient documentation

## 2012-10-22 DIAGNOSIS — Y93E1 Activity, personal bathing and showering: Secondary | ICD-10-CM | POA: Insufficient documentation

## 2012-10-22 DIAGNOSIS — G2581 Restless legs syndrome: Secondary | ICD-10-CM | POA: Insufficient documentation

## 2012-10-22 MED ORDER — OXYCODONE-ACETAMINOPHEN 5-325 MG PO TABS: 1.0000 | ORAL_TABLET | Freq: Four times a day (QID) | ORAL | Status: DC | PRN

## 2012-10-22 MED ORDER — NAPROXEN 500 MG PO TABS: 500.0000 mg | ORAL_TABLET | Freq: Two times a day (BID) | ORAL | Status: DC

## 2012-10-22 MED ORDER — OXYCODONE-ACETAMINOPHEN 5-325 MG PO TABS
1.0000 | ORAL_TABLET | Freq: Once | ORAL | Status: AC
  Administered 2012-10-22: 1 via ORAL
  Filled 2012-10-22: qty 1

## 2012-10-22 NOTE — ED Provider Notes (Signed)
CSN: [id number]     Arrival date & time 10/22/12  0744 History   First MD Initiated Contact with Patient 10/22/12 [phone]     Chief Complaint  Patient presents with  . Fall   (Consider location/radiation/quality/duration/timing/severity/associated sxs/prior Treatment) HPI Comments: Patient with prior history of CVA in 2013 with residual right-sided weakness -- presents with complaint of right posterior rib pain after a fall 4 days ago. Patient was in her shower when she stepped and her right leg "gave out". She fell and hit the side of the shower on the right side of her back. She has had pain in that area that is worse with deep breathing since that time. This morning she was unable to get out of bed because the pain was severe. She has not had any shortness of breath or difficulty breathing. She did not hit her head or lose consciousness. Right-sided weakness is at baseline. Daughter does note that the patient had another fall earlier in the month. Patient has been taking Aleve for pain which has not improved her symptoms. Onset of symptoms acute. Course is constant. Nothing makes symptoms better.  Patient is a 57 y.o. female presenting with fall. The history is provided by the patient and medical records.  Fall Pertinent negatives include no abdominal pain, chest pain, coughing, fever, headaches, myalgias, nausea, rash, sore throat or vomiting.    Past Medical History  Diagnosis Date  . Depression   . Hypertension   . Angina   . Restless leg syndrome   . TIA (transient ischemic attack) 08/2010  . Stroke 04/20/2011    a. 04/20/11 Left subcortical infarct treated w/ TPA;  b. 04/2011 Echo: EF 55-60%;  c. 04/2011 Normal Carotid u/s  d. Residual "walk w/limp on right; unable to grasp w/right hand"  . Tobacco abuse     a. quit @ time of CVA 04/2011.  [name] RLS (restless legs syndrome) 11/16/2011  . Seizures    Past Surgical History  Procedure Laterality Date  . Mastectomy  1980's    bilateral with  reconstruction  . Breast reconstruction      bilaterally  . Cesarean section  1979  . Knee arthroscopy  1990's    left; cartilage repair  . Carpal tunnel release  1990's    left   Family History  Problem Relation Age of Onset  . Lupus Mother     died early 7's.  . Emphysema Father     died late 16's.  [name] COPD Father    History  Substance Use Topics  . Smoking status: Current Every Day Smoker -- 0.50 packs/day for 37 years    Types: Cigarettes  . Smokeless tobacco: Never Used     Comment: quit 04/20/2011, restarted in May '14  . Alcohol Use: No   OB History   Grav Para Term Preterm Abortions TAB SAB Ect Mult Living                 Review of Systems  Constitutional: Negative for fever.  HENT: Negative for sore throat and rhinorrhea.   Eyes: Negative for redness.  Respiratory: Negative for cough.   Cardiovascular: Negative for chest pain.  Gastrointestinal: Negative for nausea, vomiting, abdominal pain and diarrhea.  Genitourinary: Negative for dysuria.  Musculoskeletal: Positive for back pain. Negative for myalgias.  Skin: Positive for color change. Negative for rash.  Neurological: Negative for dizziness, syncope, light-headedness and headaches.    Allergies  Codeine  Home Medications   Current Outpatient  Rx  Name  Route  Sig  Dispense  Refill  . aspirin EC 325 MG tablet   Oral   Take 325 mg by mouth daily.         [name] atorvastatin (LIPITOR) 80 MG tablet   Oral   Take 1 tablet (80 mg total) by mouth daily at 6 PM.   30 tablet   1   . ibuprofen (ADVIL,MOTRIN) 200 MG tablet   Oral   Take 400 mg by mouth every 6 (six) hours as needed for pain or headache.         . levETIRAcetam (KEPPRA) 500 MG tablet   Oral   Take 1 tablet (500 mg total) by mouth 2 (two) times daily.   180 tablet   3   . pantoprazole (PROTONIX) 40 MG tablet   Oral   Take 40 mg by mouth daily as needed. indigestion         . rOPINIRole (REQUIP) 0.5 MG tablet   Oral   Take 1  tablet (0.5 mg total) by mouth at bedtime.   90 tablet   3   . sertraline (ZOLOFT) 100 MG tablet   Oral   Take 100 mg by mouth daily.         . traMADol (ULTRAM) 50 MG tablet   Oral   Take 50 mg by mouth every 6 (six) hours as needed for pain.          BP 138/82  Pulse 70  Temp(Src) 97.8 F (36.6 C) (Oral)  Resp 16  SpO2 97% Physical Exam  Nursing note and vitals reviewed. Constitutional: She appears well-developed and well-nourished.  HENT:  Head: Normocephalic and atraumatic.  Eyes: Conjunctivae are normal. Right eye exhibits no discharge. Left eye exhibits no discharge.  Neck: Normal range of motion. Neck supple.  Cardiovascular: Normal rate, regular rhythm and normal heart sounds.   Pulmonary/Chest: Effort normal and breath sounds normal.  Abdominal: Soft. There is no tenderness.  Musculoskeletal:       Cervical back: She exhibits normal range of motion, no tenderness and no bony tenderness.       Thoracic back: She exhibits tenderness. She exhibits normal range of motion and no bony tenderness.       Lumbar back: She exhibits normal range of motion, no tenderness and no bony tenderness.       Back:  Neurological: She is alert.  Skin: Skin is warm and dry.  Psychiatric: She has a normal mood and affect.    ED Course  Procedures (including critical care time) Labs Review Labs Reviewed - No data to display Imaging Review Dg Chest 2 View  10/22/2012   *RADIOLOGY REPORT*  Clinical Data: Status post fall 4 days ago with right rib pain  CHEST - 2 VIEW  Comparison:  May 19, 2012, November 16, 2011  Views: [place] and lateral views.  FINDINGS: There is no focal infiltrate, pulmonary edema, or pleural effusion. The mediastinal contour and cardiac silhouette are stable. The soft tissue and osseous structures are normal.  IMPRESSION: No acute cardiopulmonary disease identified.   Original Report Authenticated By: Sherian Rein, M.D.    8:22 AM Patient seen and examined.  Work-up initiated. Medications ordered.   Vital signs reviewed and are as follows: Filed Vitals:   10/22/12 0753  BP: 138/82  Pulse: 70  Temp: 97.8 F (36.6 C)  Resp: 16   9:53 AM x-ray reviewed by myself. No definite rib fracture. Patient states her pain  is improved after medication. Counseled to take 10 deep breaths every hour while awake and return if she has severe pain or worsening difficulty breathing.  Patient counseled on use of narcotic pain medications. Counseled not to combine these medications with others containing tylenol. Told to use lowest dose needed to control pain these medications can lead to falls. Urged not to drink alcohol, drive, or perform any other activities that requires focus while taking these medications. The patient verbalizes understanding and agrees with the plan.     MDM   1. Rib contusion, right, initial encounter    Patient with rib contusion. Negative x-ray. No respiratory distress. Would not be surprised if patient had occult rib fracture. Lung parenchyma appears normal. Pain improved in emergency department after treatment. Patient will be staying with her daughter tonight who can keep a close eye on her and monitor reaction to pain medication and assist as needed.    Renne Crigler, PA-C 10/22/12 [phone]

## 2012-10-22 NOTE — ED Provider Notes (Addendum)
Medical screening examination/treatment/procedure(s) were conducted as a shared visit with non-physician practitioner(s) and myself.  I personally evaluated the patient during the encounter  Pt s/p fall with injury to right flank on Wednesday, pain has gotten progressively a little worse, no hematuria, hurts to twist, cough, sneeze. OTC meds are not helping.  No fevers, chills.  Pt has prior h/o stroke, leg got weak in bathtub and she fell against corner of wall.  Bruising to right flank, tender along rib just above bruising, with no sig crepitus.  No abdominal guard or rebound.      Anna Lyons. Oletta Lamas, MD 10/22/12 1308  Anna Lyons. Oletta Lamas, MD 10/22/12 [phone]

## 2012-10-22 NOTE — ED Notes (Signed)
Pt states she fell on Wednesday getting out of the shower.  Pt presents today with c/o right sided rib pain.

## 2012-10-22 NOTE — ED Notes (Signed)
Patient discharged to home with family. NAD.  

## 2012-10-22 NOTE — ED Notes (Signed)
Patient assisted to put take shirt off and put gown on. Bruising noted to right lower lateral chest. Patient states she fell Wednesday and pain has been getting worse since then. Patient reports she has trouble getting out of bed since the fall because of soreness.

## 2012-10-29 ENCOUNTER — Encounter (HOSPITAL_COMMUNITY): Payer: Self-pay

## 2012-10-29 ENCOUNTER — Emergency Department (HOSPITAL_COMMUNITY)
Admission: EM | Admit: 2012-10-29 | Discharge: 2012-10-29 | Disposition: A | Discharge status: Home / Self Care | Payer: Medicaid Other | Attending: Emergency Medicine | Admitting: Emergency Medicine

## 2012-10-29 ENCOUNTER — Emergency Department (HOSPITAL_COMMUNITY): Payer: Medicaid Other

## 2012-10-29 DIAGNOSIS — F3289 Other specified depressive episodes: Secondary | ICD-10-CM | POA: Insufficient documentation

## 2012-10-29 DIAGNOSIS — Y990 Civilian activity done for income or pay: Secondary | ICD-10-CM | POA: Insufficient documentation

## 2012-10-29 DIAGNOSIS — Z8673 Personal history of transient ischemic attack (TIA), and cerebral infarction without residual deficits: Secondary | ICD-10-CM | POA: Insufficient documentation

## 2012-10-29 DIAGNOSIS — F329 Major depressive disorder, single episode, unspecified: Secondary | ICD-10-CM | POA: Insufficient documentation

## 2012-10-29 DIAGNOSIS — Z791 Long term (current) use of non-steroidal anti-inflammatories (NSAID): Secondary | ICD-10-CM | POA: Insufficient documentation

## 2012-10-29 DIAGNOSIS — G40909 Epilepsy, unspecified, not intractable, without status epilepticus: Secondary | ICD-10-CM | POA: Insufficient documentation

## 2012-10-29 DIAGNOSIS — W19XXXA Unspecified fall, initial encounter: Secondary | ICD-10-CM

## 2012-10-29 DIAGNOSIS — Z7982 Long term (current) use of aspirin: Secondary | ICD-10-CM | POA: Insufficient documentation

## 2012-10-29 DIAGNOSIS — I10 Essential (primary) hypertension: Secondary | ICD-10-CM | POA: Insufficient documentation

## 2012-10-29 DIAGNOSIS — F411 Generalized anxiety disorder: Secondary | ICD-10-CM | POA: Insufficient documentation

## 2012-10-29 DIAGNOSIS — W1809XA Striking against other object with subsequent fall, initial encounter: Secondary | ICD-10-CM | POA: Insufficient documentation

## 2012-10-29 DIAGNOSIS — Y9289 Other specified places as the place of occurrence of the external cause: Secondary | ICD-10-CM | POA: Insufficient documentation

## 2012-10-29 DIAGNOSIS — S81009A Unspecified open wound, unspecified knee, initial encounter: Secondary | ICD-10-CM | POA: Insufficient documentation

## 2012-10-29 DIAGNOSIS — R5381 Other malaise: Secondary | ICD-10-CM | POA: Insufficient documentation

## 2012-10-29 DIAGNOSIS — Y9389 Activity, other specified: Secondary | ICD-10-CM | POA: Insufficient documentation

## 2012-10-29 DIAGNOSIS — Z8669 Personal history of other diseases of the nervous system and sense organs: Secondary | ICD-10-CM | POA: Insufficient documentation

## 2012-10-29 DIAGNOSIS — S81012A Laceration without foreign body, left knee, initial encounter: Secondary | ICD-10-CM

## 2012-10-29 DIAGNOSIS — W268XXA Contact with other sharp object(s), not elsewhere classified, initial encounter: Secondary | ICD-10-CM | POA: Insufficient documentation

## 2012-10-29 DIAGNOSIS — F17200 Nicotine dependence, unspecified, uncomplicated: Secondary | ICD-10-CM | POA: Insufficient documentation

## 2012-10-29 MED ORDER — ONDANSETRON 4 MG PO TBDP
8.0000 mg | ORAL_TABLET | Freq: Once | ORAL | Status: AC
  Administered 2012-10-29: 8 mg via ORAL
  Filled 2012-10-29: qty 2

## 2012-10-29 NOTE — ED Provider Notes (Signed)
CSN: [id number]     Arrival date & time 10/29/12  1326 History   First MD Initiated Contact with Patient 10/29/12 1403     Chief Complaint  Patient presents with  . Laceration   (Consider location/radiation/quality/duration/timing/severity/associated sxs/prior Treatment) HPI  Patient with hx stroke with residual but worsening right sided deficits and ongoing balance issues x 1 month with multiple falls presents with left knee laceration after falling on a large piece of glass.  States that she was working on picture frames, stood up, felt her right leg go out from under her and fell onto her left knee, hitting the glass.  Denies weakness or numbness of the left leg, pain or difficulty moving her knee.  Denies any new focal neurological deficits.  She states she has been dizzy and off balance for one month, gradually worsening, and also some slight increased weakness of her chronic right sided weakness.  She has an upcoming appointment with her neurologist but states that because of her prior stroke she has trouble remembering dates and the name of her neurologist.     Past Medical History  Diagnosis Date  . Depression   . Hypertension   . Angina   . Restless leg syndrome   . TIA (transient ischemic attack) 08/2010  . Stroke 04/20/2011    a. 04/20/11 Left subcortical infarct treated w/ TPA;  b. 04/2011 Echo: EF 55-60%;  c. 04/2011 Normal Carotid u/s  d. Residual "walk w/limp on right; unable to grasp w/right hand"  . Tobacco abuse     a. quit @ time of CVA 04/2011.  [name] RLS (restless legs syndrome) 11/16/2011  . Seizures    Past Surgical History  Procedure Laterality Date  . Mastectomy  1980's    bilateral with reconstruction  . Breast reconstruction      bilaterally  . Cesarean section  1979  . Knee arthroscopy  1990's    left; cartilage repair  . Carpal tunnel release  1990's    left   Family History  Problem Relation Age of Onset  . Lupus Mother     died early 25's.  . Emphysema  Father     died late 42's.  [name] COPD Father    History  Substance Use Topics  . Smoking status: Current Every Day Smoker -- 0.50 packs/day for 37 years    Types: Cigarettes  . Smokeless tobacco: Never Used     Comment: quit 04/20/2011, restarted in May '14  . Alcohol Use: No   OB History   Grav Para Term Preterm Abortions TAB SAB Ect Mult Living                 Review of Systems  Respiratory: Negative for cough and shortness of breath.   Cardiovascular: Negative for chest pain.  Gastrointestinal: Negative for abdominal pain.  Musculoskeletal: Positive for gait problem.  Skin: Positive for wound.  Neurological: Positive for dizziness, weakness (chronic right sided weakness, gradually worsening over 1 month) and light-headedness. Negative for syncope, numbness and headaches.    Allergies  Codeine  Home Medications   Current Outpatient Rx  Name  Route  Sig  Dispense  Refill  . aspirin EC 325 MG tablet   Oral   Take 325 mg by mouth daily.         [name] atorvastatin (LIPITOR) 80 MG tablet   Oral   Take 80 mg by mouth daily.         [name] ibuprofen (ADVIL,MOTRIN) 200  MG tablet   Oral   Take 400 mg by mouth every 6 (six) hours as needed for pain or headache.         . levETIRAcetam (KEPPRA) 500 MG tablet   Oral   Take 500 mg by mouth every 12 (twelve) hours.         . naproxen (NAPROSYN) 500 MG tablet   Oral   Take 500 mg by mouth at bedtime.         [name] oxyCODONE-acetaminophen (PERCOCET/ROXICET) 5-325 MG per tablet   Oral   Take 0.5 tablets by mouth at bedtime as needed for pain.         [name] rOPINIRole (REQUIP) 0.5 MG tablet   Oral   Take 0.5 mg by mouth at bedtime.         . sertraline (ZOLOFT) 100 MG tablet   Oral   Take 200 mg by mouth daily.           BP 142/84  Pulse 80  Temp(Src) 98 F (36.7 C) (Oral)  Resp 22  SpO2 97% Physical Exam  Nursing note and vitals reviewed. Constitutional: She appears well-developed and well-nourished. No distress.   HENT:  Head: Normocephalic and atraumatic.  Neck: Neck supple.  Pulmonary/Chest: Effort normal.  Neurological: She is alert. She has normal strength. No cranial nerve deficit or sensory deficit. GCS eye subscore is 4. GCS verbal subscore is 5. GCS motor subscore is 6.  CN II-XII intact, EOMs intact, no pronator drift, grip strengths equal bilaterally; strength 5/5 in all extremities, sensation intact in all extremities; finger to nose, heel to shin, rapid alternating movements normal.  Pt ambulates with assistance, stumbles once.  Gait is slow but otherwise normal in appearance.     Skin: She is not diaphoretic.  Linear laceration over left patella.  No bony tenderness or crepitus.  Hemostatic. Full AROM of left knee.  Distal pulses and sensation intact.     ED Course  Procedures (including critical care time) Labs Review Labs Reviewed - No data to display Imaging Review No results found.  LACERATION REPAIR Performed by: Trixie Dredge B Authorized by: Trixie Dredge B Consent: Verbal consent obtained. Risks and benefits: risks, benefits and alternatives were discussed Consent given by: patient Patient identity confirmed: provided demographic data Prepped and Draped in normal sterile fashion Wound explored  Laceration Location: left knee   Laceration Length: 4 cm  No Foreign Bodies seen or palpated  Anesthesia: local infiltration  Local anesthetic: lidocaine 2% with epinephrine  Anesthetic amount: 4cc  Irrigation method: syringe Amount of cleaning: standard  Skin closure: 4-0 ethilon  Number of sutures: 6  Technique: simple interrupted  Patient tolerance: Patient tolerated the procedure well with no immediate complications.   MDM   1. Knee laceration, left, initial encounter   2. Fall, initial encounter      Pt with one month of dizziness and feeling off balance with several falls because of this presents for laceration repair.  Knee with simple laceration.  No  bony tenderness.  Neurovascularly intact.  No FB seen or palpated. Laceration repaired in ED by PA-S.  Head CT negative.  Pt sees Dr Pearlean Brownie, per daughter.  I have advised her to schedule a close follow up appointment with him.     [place], PA-C 10/30/12 [phone]

## 2012-10-29 NOTE — ED Notes (Addendum)
Pt. Cut her lt. Knee on glass. Bleeding controlled 1 inch laceration. Pt. Also reports that she is nauseated and she feels unsteady on her feet.  She reports that she has fallen a few times in the last month.

## 2012-10-29 NOTE — ED Notes (Signed)
Patient transported to CT 

## 2012-10-31 NOTE — ED Provider Notes (Signed)
Medical screening examination/treatment/procedure(s) were performed by non-physician practitioner and as supervising physician I was immediately available for consultation/collaboration.   Joya Gaskins, MD 10/31/12 2255

## 2012-11-17 ENCOUNTER — Telehealth: Payer: Self-pay | Admitting: Neurology

## 2012-11-17 NOTE — Telephone Encounter (Signed)
Spoke with daughter(Dawn) and she said that patient is unsteady on feet, has had 3 falls, 2 of which she went to ER for cracked ribs, had CT(showed no stroke) but they wanted her to f/u with her neurologist. There is a change in speech-more slurred all the time, is dragging her right hand and leg, seems not to be picking up feet, right hand seems weaker.

## 2012-11-17 NOTE — Telephone Encounter (Signed)
I called and LMVM for Dawn or Gwendoline to call back.  I was returning their call.

## 2012-11-20 NOTE — Telephone Encounter (Signed)
Needs f/u appointment wit Lynn/me

## 2012-11-22 ENCOUNTER — Encounter (HOSPITAL_COMMUNITY): Payer: Self-pay | Admitting: Emergency Medicine

## 2012-11-22 ENCOUNTER — Emergency Department (HOSPITAL_COMMUNITY): Payer: Medicaid Other

## 2012-11-22 ENCOUNTER — Emergency Department (HOSPITAL_COMMUNITY)
Admission: EM | Admit: 2012-11-22 | Discharge: 2012-11-22 | Disposition: A | Discharge status: Home / Self Care | Payer: Medicaid Other | Attending: Emergency Medicine | Admitting: Emergency Medicine

## 2012-11-22 DIAGNOSIS — I10 Essential (primary) hypertension: Secondary | ICD-10-CM | POA: Insufficient documentation

## 2012-11-22 DIAGNOSIS — R209 Unspecified disturbances of skin sensation: Secondary | ICD-10-CM | POA: Insufficient documentation

## 2012-11-22 DIAGNOSIS — I69991 Dysphagia following unspecified cerebrovascular disease: Secondary | ICD-10-CM | POA: Insufficient documentation

## 2012-11-22 DIAGNOSIS — I69921 Dysphasia following unspecified cerebrovascular disease: Secondary | ICD-10-CM | POA: Insufficient documentation

## 2012-11-22 DIAGNOSIS — F17200 Nicotine dependence, unspecified, uncomplicated: Secondary | ICD-10-CM | POA: Insufficient documentation

## 2012-11-22 DIAGNOSIS — R4702 Dysphasia: Secondary | ICD-10-CM

## 2012-11-22 DIAGNOSIS — F329 Major depressive disorder, single episode, unspecified: Secondary | ICD-10-CM | POA: Insufficient documentation

## 2012-11-22 DIAGNOSIS — Z7982 Long term (current) use of aspirin: Secondary | ICD-10-CM | POA: Insufficient documentation

## 2012-11-22 DIAGNOSIS — F3289 Other specified depressive episodes: Secondary | ICD-10-CM | POA: Insufficient documentation

## 2012-11-22 DIAGNOSIS — I69959 Hemiplegia and hemiparesis following unspecified cerebrovascular disease affecting unspecified side: Secondary | ICD-10-CM | POA: Insufficient documentation

## 2012-11-22 DIAGNOSIS — G40909 Epilepsy, unspecified, not intractable, without status epilepticus: Secondary | ICD-10-CM | POA: Insufficient documentation

## 2012-11-22 DIAGNOSIS — Z79899 Other long term (current) drug therapy: Secondary | ICD-10-CM | POA: Insufficient documentation

## 2012-11-22 LAB — POCT I-STAT, CHEM 8
Chloride: 95 mEq/L — ABNORMAL LOW (ref 96–112)
Creatinine, Ser: 1 mg/dL (ref 0.50–1.10)
Glucose, Bld: 87 mg/dL (ref 70–99)
HCT: 37 % (ref 36.0–46.0)
Hemoglobin: 12.6 g/dL (ref 12.0–15.0)
Potassium: 3.8 mEq/L (ref 3.5–5.1)
Sodium: 135 mEq/L (ref 135–145)
TCO2: 28 mmol/L (ref 0–100)

## 2012-11-22 LAB — URINALYSIS, ROUTINE W REFLEX MICROSCOPIC
Glucose, UA: NEGATIVE mg/dL
Hgb urine dipstick: NEGATIVE
Leukocytes, UA: NEGATIVE
Specific Gravity, Urine: 1.008 (ref 1.005–1.030)
Urobilinogen, UA: 0.2 mg/dL (ref 0.0–1.0)
pH: 5.5 (ref 5.0–8.0)

## 2012-11-22 MED ORDER — ACETAMINOPHEN 500 MG PO TABS
1000.0000 mg | ORAL_TABLET | Freq: Once | ORAL | Status: AC
  Administered 2012-11-22: 1000 mg via ORAL
  Filled 2012-11-22: qty 2

## 2012-11-22 NOTE — ED Notes (Signed)
Pt returned from MRI °

## 2012-11-22 NOTE — ED Notes (Signed)
Pt and daughter report increased right leg weakness x "several weeks." Pt states she has noticed increase in dragging right leg, decrease in right hand strength. Daughter in law called pt today and noted pt to have slurred speech. Daughter reports continued slurred speech. Minimal slurring noted from pt. Neuro intact. Pt also reports 10/10 right shoulder pain, but denies injury. AO x4.

## 2012-11-22 NOTE — ED Notes (Signed)
Pt daughter states when she talked to the pt on the phone today her speech sounded different and the pt c/o dizziness. Daughter reports history of stroke with residual weakness in R arm and leg and speech difficulty. However daughter states the pt speech "sounds worse" today. Pt lives at home alone and family last saw her normal yesterday. Pt c/o R arm pain now which her PCP has been evaluating her for. Pt is A&Ox4, no facial droop.

## 2012-11-22 NOTE — ED Notes (Signed)
Patient going to Xray at his time.

## 2012-11-22 NOTE — ED Provider Notes (Signed)
CSN: [id number]     Arrival date & time 11/22/12  1200 History   First MD Initiated Contact with Patient 11/22/12 1207     Chief Complaint  Patient presents with  . Stroke Symptoms    HPI  Patient presents with concern of increasing speech difficulty, increasing right hemiparesis.  Patient has history of stroke in the distant past, with chronic weakness on the right side.  However, over the past days it seems as though the patient's speech has become less intelligible , with concurrent decreased strength in the right upper and lower extremities. There is no new pain, no new contralateral weakness. No change in medication, no change in behavior, no change in diet.   Past Medical History  Diagnosis Date  . Depression   . Hypertension   . Angina   . Restless leg syndrome   . TIA (transient ischemic attack) 08/2010  . Stroke 04/20/2011    a. 04/20/11 Left subcortical infarct treated w/ TPA;  b. 04/2011 Echo: EF 55-60%;  c. 04/2011 Normal Carotid u/s  d. Residual "walk w/limp on right; unable to grasp w/right hand"  . Tobacco abuse     a. quit @ time of CVA 04/2011.  [name] RLS (restless legs syndrome) 11/16/2011  . Seizures    Past Surgical History  Procedure Laterality Date  . Mastectomy  1980's    bilateral with reconstruction  . Breast reconstruction      bilaterally  . Cesarean section  1979  . Knee arthroscopy  1990's    left; cartilage repair  . Carpal tunnel release  1990's    left   Family History  Problem Relation Age of Onset  . Lupus Mother     died early 49's.  . Emphysema Father     died late 66's.  [name] COPD Father    History  Substance Use Topics  . Smoking status: Current Every Day Smoker -- 0.50 packs/day for 37 years    Types: Cigarettes  . Smokeless tobacco: Never Used     Comment: quit 04/20/2011, restarted in May '14  . Alcohol Use: No   OB History   Grav Para Term Preterm Abortions TAB SAB Ect Mult Living                 Review of Systems   Constitutional:       Per HPI, otherwise negative  HENT:       Per HPI, otherwise negative  Respiratory:       Per HPI, otherwise negative  Cardiovascular:       Per HPI, otherwise negative  Gastrointestinal: Negative for vomiting.  Endocrine:       Negative aside from HPI  Genitourinary:       Neg aside from HPI   Musculoskeletal:       Per HPI, otherwise negative  Skin: Negative.   Neurological: Positive for facial asymmetry, speech difficulty, weakness and numbness. Negative for dizziness, tremors, seizures, syncope, light-headedness and headaches.    Allergies  Codeine  Home Medications   Current Outpatient Rx  Name  Route  Sig  Dispense  Refill  . aspirin EC 325 MG tablet   Oral   Take 325 mg by mouth daily.         [name] atorvastatin (LIPITOR) 80 MG tablet   Oral   Take 80 mg by mouth daily.         [name] ibuprofen (ADVIL,MOTRIN) 200 MG tablet   Oral   Take  400 mg by mouth every 6 (six) hours as needed for pain or headache.         . levETIRAcetam (KEPPRA) 500 MG tablet   Oral   Take 500 mg by mouth every 12 (twelve) hours.         . naproxen (NAPROSYN) 500 MG tablet   Oral   Take 500 mg by mouth at bedtime.         [name] oxyCODONE-acetaminophen (PERCOCET/ROXICET) 5-325 MG per tablet   Oral   Take 0.5 tablets by mouth at bedtime as needed for pain.         [name] rOPINIRole (REQUIP) 0.5 MG tablet   Oral   Take 0.5 mg by mouth at bedtime.         . sertraline (ZOLOFT) 100 MG tablet   Oral   Take 200 mg by mouth daily.           BP 133/84  Pulse 68  Temp(Src) 97.9 F (36.6 C) (Oral)  Resp 20  Ht 5\' 4"  (1.626 m)  Wt 146 lb (66.225 kg)  BMI 25.05 kg/m2  SpO2 97% Physical Exam  Nursing note and vitals reviewed. Constitutional: She is oriented to person, place, and time. She appears well-developed and well-nourished. No distress.  HENT:  Head: Normocephalic and atraumatic.  Eyes: Conjunctivae and EOM are normal.  Cardiovascular: Normal rate  and regular rhythm.   Pulmonary/Chest: Effort normal and breath sounds normal. No stridor. No respiratory distress.  Abdominal: She exhibits no distension.  Musculoskeletal: She exhibits no edema.  Neurological: She is alert and oriented to person, place, and time. She displays atrophy. She displays no tremor. A cranial nerve deficit is present. No sensory deficit. She exhibits abnormal muscle tone. She displays no seizure activity. Coordination normal.  R UE / LE weakness, 4/5 strength - subjectively weaker than baseline, per patient and daughter.  Speech is garbled.  No gross expressive aphasia  Skin: Skin is warm and dry.  Psychiatric: She has a normal mood and affect.    ED Course  Procedures (including critical care time) Labs Review Labs Reviewed - No data to display Imaging Review No results found.  EKG Interpretation     Ventricular Rate:  68 PR Interval:  176 QRS Duration: 82 QT Interval:  464 QTC Calculation: 493 R Axis:   -67 Text Interpretation:  Normal sinus rhythm Left anterior fascicular block T wave abnormality, consider anterior ischemia Prolonged QT Abnormal ECG           pulse oximetry 100% room air normal After the initial evaluation I reviewed the patient's chart, including a neurology notes.    3:46 PM On repeat exam the patient appears comfortable.  No new complaints.  Discussed the results with her and her daughter.  MDM  No diagnosis found. Patient presents with ongoing slurred speech, questionable new/worsening right hemiplegia.  Patient's history of stroke suggest either progression of disease versus new event.  Patient's MRI is unremarkable, and she remained hemodynamic stable, with no new complaints throughout her emergency department course.  I had a lengthy discussion with patient and her daughter regarding need for followup, additional therapy as an outpatient.  Patient was discharged in stable condition.    Gerhard Munch,  MD 11/22/12 1547

## 2012-11-23 LAB — GLUCOSE, CAPILLARY: Glucose-Capillary: 86 mg/dL (ref 70–99)

## 2012-11-23 NOTE — Telephone Encounter (Signed)
Patient has been sched w/ Larita Fife 12/01/12@2 :30,confirmed w/ daughter

## 2012-12-01 ENCOUNTER — Ambulatory Visit (INDEPENDENT_AMBULATORY_CARE_PROVIDER_SITE_OTHER): Payer: Self-pay | Admitting: Nurse Practitioner

## 2012-12-01 ENCOUNTER — Encounter (INDEPENDENT_AMBULATORY_CARE_PROVIDER_SITE_OTHER): Payer: Self-pay

## 2012-12-01 ENCOUNTER — Encounter: Payer: Self-pay | Admitting: Nurse Practitioner

## 2012-12-01 VITALS — BP 125/85 | HR 67 | Temp 98.0°F | Ht 63.5 in | Wt 144.0 lb

## 2012-12-01 DIAGNOSIS — G8111 Spastic hemiplegia affecting right dominant side: Secondary | ICD-10-CM

## 2012-12-01 DIAGNOSIS — R296 Repeated falls: Secondary | ICD-10-CM

## 2012-12-01 DIAGNOSIS — G2581 Restless legs syndrome: Secondary | ICD-10-CM

## 2012-12-01 DIAGNOSIS — I6350 Cerebral infarction due to unspecified occlusion or stenosis of unspecified cerebral artery: Secondary | ICD-10-CM

## 2012-12-01 DIAGNOSIS — G8110 Spastic hemiplegia affecting unspecified side: Secondary | ICD-10-CM

## 2012-12-01 DIAGNOSIS — Z9181 History of falling: Secondary | ICD-10-CM

## 2012-12-01 MED ORDER — BACLOFEN 10 MG PO TABS: 10.0000 mg | ORAL_TABLET | Freq: Three times a day (TID) | ORAL | Status: DC

## 2012-12-01 NOTE — Progress Notes (Signed)
[facility]  PATIENT: Anna Lyons DOB: 08-Mar-1955   REASON FOR VISIT: follow up HISTORY FROM: patient  UPDATE 12/01/12 (LL): Anna Lyons comes to office for stroke revisit.  She reports that she has been having increased trouble with balance in the last couple months, with 3-4 falls in the last month which sent her to the ER.  Repeat MRI did not show new stroke.  She states she feels like she is going to the side when she walks.  She has increased pain in right shoulder and decreased ROM, and feels like she is dragging her right foot sometimes.  She is suffering with insomnia, waking frequently through the night, and was prescribed Clonazepam by her PCP.  Her depression is much better since last visit.  UPDATE 08/28/12 (LL): Anna Lyons returns to office for follow up of seizures (2) which occurred on 05/19/12. They occurred while patient was staying with sick husband in hospital, was sleep deprived and stressed out. Husband subsequently died one month ago. Patient has not had anymore seizures, is maintained on Keppra 500mg  BID. No new neurovascular symptoms. Not sleeping well per daughter, patient has been very depressed since husband's death. Zoloft was increased by PCP. RLS symptoms worse, needs refill on Requip. Dr. Jeannetta Nap manages HTN and cholesterol, patient does not know last lab values. Daughter reports BP med was recently changed due to hypokalemia. Patient reportedly stating smoking again when husband was terminal with cancer. Plans on quitting asap. Patient c/o right shoulder pain, weak from stroke and exacerbated by moving/carrying boxes.   Anna Lyons is a 57 y.o. 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 The first happened on the floor of the hospital that she was visiting her husband who was a patient. She is staying  with him and was incontinent of both stool and urine and had what was reported to be should shaking activity. She was then taken to the emergency room where she was witnessed to have right head and eye deviation followed by tonic-clonic activity. She was given Ativan and Versed. MRI showed only evidence of a prior CVA, and EEG was unremarkable. The patient's seizure was likely a combination of a lowered seizure threshold, due to sleep deprivation in the context of visiting her hospitalized husband, and a predisposition to seizures, possibly related to her old CVA. The patient was started on Keppra, with no further seizure activity during the hospitalization.   02/17/12 PRIOR HPI (PS): 57 year old female was last seen in hospital for stroke or March of 2013. At that time she received TPA. Workup showed normal carotid Dopplers, 2-D echo showed 55-60% EF, negative CT angiogram extracranial vessels, negative CT brain, and positive stroke in the left posterior lentiform nucleus seen on MRI. She was discharged on aspirin 325 mg at that time. She was doing well until 11/16/2011 a.m. She awoke at 8 AM in normal state. She ate breakfast at 10:30 and noted she had difficulty chewing her food. She brushed her teeth at 11 AM and noticed a right facial droop. No other symptoms. Initial CT head showed no acute infarct or bleed. Facial droop was still present. During further exam her facial droop improved and she is now back to her baseline. Initial NIH SS of 2 for right facial droop and pronator drift. CT of the brain 11/16/2011 remote left basal ganglia lacunar  infarction. Mild white matter changes as above. No definite CT evidence for acute intracranial abnormality. MRI of the brain Anna Lyons 15 2013 chronic left hemisphere infarction as described. Small foci of restricted diffusion in the left periventricular region could represent acute or chronic ischemia. Mild atrophy and mild chronic microvascular ischemic change. Chest  x-ray 11/16/2011 cardiomegaly without congestive failure.   REVIEW OF SYSTEMS: Full 14 system review of systems performed and notable only for:  constitutional: fatigue  ear/nose/throat: ringing in ears musculoskeletal: joint pain, joint swelling, cramps, aching muscles neurological: slurred speech  sleep: insomnia, restless legs psychiatric: depression, not enough sleep, change in appetitie  ALLERGIES: Allergies  Allergen Reactions  . Codeine Itching    HOME MEDICATIONS: Outpatient Prescriptions Prior to Visit  Medication Sig Dispense Refill  . aspirin EC 325 MG tablet Take 325 mg by mouth daily.      [name] atorvastatin (LIPITOR) 80 MG tablet Take 80 mg by mouth daily.      . hydrOXYzine (VISTARIL) 25 MG capsule Take 25 mg by mouth daily as needed for anxiety.      . levETIRAcetam (KEPPRA) 500 MG tablet Take 500 mg by mouth every 12 (twelve) hours.      . meloxicam (MOBIC) 15 MG tablet Take 15 mg by mouth daily as needed for pain.      [name] rOPINIRole (REQUIP) 0.5 MG tablet Take 0.5 mg by mouth at bedtime.      . sertraline (ZOLOFT) 100 MG tablet Take 200 mg by mouth daily.       [name] triamterene-hydrochlorothiazide (MAXZIDE) 75-50 MG per tablet Take 0.5 tablets by mouth daily.       . clonazePAM (KLONOPIN) 0.5 MG tablet    Sig: Take 0.5 mg by mouth at bedtime.  [name] oxyCODONE-acetaminophen (PERCOCET/ROXICET) 5-325 MG per tablet    Sig: Take 1 tablet by mouth every 6 (six) hours as needed for pain.    PAST MEDICAL HISTORY: Past Medical History  Diagnosis Date  . Depression   . Hypertension   . Angina   . Restless leg syndrome   . TIA (transient ischemic attack) 08/2010  . Stroke 04/20/2011    a. 04/20/11 Left subcortical infarct treated w/ TPA;  b. 04/2011 Echo: EF 55-60%;  c. 04/2011 Normal Carotid u/s  d. Residual "walk w/limp on right; unable to grasp w/right hand"  . Tobacco abuse     a. quit @ time of CVA 04/2011.  [name] RLS (restless legs syndrome) 11/16/2011  . Seizures     PAST  SURGICAL HISTORY: Past Surgical History  Procedure Laterality Date  . Mastectomy  1980's    bilateral with reconstruction  . Breast reconstruction      bilaterally  . Cesarean section  1979  . Knee arthroscopy  1990's    left; cartilage repair  . Carpal tunnel release  1990's    left    FAMILY HISTORY: Family History  Problem Relation Age of Onset  . Lupus Mother     died early 64's.  . Emphysema Father     died late 13's.  [name] COPD Father     SOCIAL HISTORY: History   Social History  . Marital Status: Widowed    Spouse Name: N/A    Number of Children: 1  . Years of Education: 12   Occupational History  . disabled    Social History Main Topics  . Smoking status: Current Every Day Smoker -- 0.50 packs/day for 37 years    Types: Cigarettes  .  Smokeless tobacco: Never Used     Comment: quit 04/20/2011, restarted in May '14  . Alcohol Use: 8.4 oz/week    14 Glasses of wine per week  . Drug Use: No  . Sexual Activity: No   Other Topics Concern  . Not on file   Social History Narrative   Pt lives alone.   Caffeine Use: 1-3 cups of caffeine daily (tea)     PHYSICAL EXAM  Filed Vitals:   12/01/12 1437  BP: 125/85  Pulse: 67  Temp: 98 F (36.7 C)  TempSrc: Oral  Height: 5' 3.5" (1.613 m)  Weight: 144 lb (65.318 kg)   Body mass index is 25.11 kg/(m^2).  Generalized: In no acute distress Neck: Supple, no carotid bruits  Cardiac: Regular rate rhythm, no murmur  Pulmonary: Clear to auscultation bilaterally  Musculoskeletal: No deformity   Neurological examination  Mentation: Alert oriented to time, place, history taking, language fluent, with mild dysarthria.  GDS 5. Cranial nerve II-XII: Pupils were equal round reactive to light extraocular movements were full, visual field were full on confrontational test. facial sensation and strength were normal. hearing was intact to finger rubbing bilaterally. Uvula tongue midline. head turning and shoulder shrug  and were normal and symmetric.Tongue protrusion into cheek strength was normal.  MOTOR:  Increased tone on the right. Mild right upper and lower extremity drift with 4/5 strength weakness of RUE and intrinsic hand muscles. Right UE ROM limited by pain.  Left orbits right arm. SENSORY: normal and symmetric to light touch, temperature, vibration  COORDINATION: normal finger-nose-finger, heel-to-shin bilaterally REFLEXES: increased on right side GAIT/STATION: Rising up from seated position without assistance, right hemiplegic gait with cane, unable to perform tiptoe, and heel walking without difficulty. Romberg positive.   DIAGNOSTIC DATA (LABS, IMAGING, TESTING) - I reviewed patient records, labs, notes, testing and imaging myself where available.  Lab Results  Component Value Date   WBC 4.4 05/21/2012   HGB 12.6 11/22/2012   HCT 37.0 11/22/2012   MCV 95.7 05/21/2012   PLT 130* 05/21/2012      Component Value Date/Time   NA 135 11/22/2012 1452   K 3.8 11/22/2012 1452   CL 95* 11/22/2012 1452   CO2 26 05/20/2012 0710   GLUCOSE 87 11/22/2012 1452   BUN 12 11/22/2012 1452   CREATININE 1.00 11/22/2012 1452   CALCIUM 8.3* 05/20/2012 0710   PROT 7.0 05/19/2012 1235   ALBUMIN 3.6 05/19/2012 1235   AST 27 05/19/2012 1235   ALT 15 05/19/2012 1235   ALKPHOS 62 05/19/2012 1235   BILITOT 0.5 05/19/2012 1235   GFRNONAA >90 05/20/2012 0710   GFRAA >90 05/20/2012 0710   Lab Results  Component Value Date   CHOL 152 05/19/2012   HDL 50 05/19/2012   LDLCALC 83 05/19/2012   TRIG 95 05/19/2012   CHOLHDL 3.0 05/19/2012   Lab Results  Component Value Date   HGBA1C 5.7* 11/17/2011   No results found for this basename: VITAMINB12   Lab Results  Component Value Date   TSH 1.778 05/19/2012   MRI HEAD WITHOUT CONTRAST 11/22/12 No acute intracranial abnormality or significant interval change.  Stable remote infarcts of the basal ganglia and left groin radiata.  Remote blood products associated with the left  basal ganglia infarcts are stable. Atrophy and white matter disease is advanced for age. This likely reflects the sequela of chronic microvascular ischemia.  ASSESSMENT AND PLAN Anna Lyons is a 56 y.o. 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 The patient's seizure was likely a combination of a lowered seizure threshold, due to sleep deprivation in the context of visiting her hospitalized husband, and a predisposition to seizures, possibly related to her old CVA. She now is having increased right spastic hemiplegia, with frequent falls.  PLAN: Continue Keppra 500 mg BID. and Requip for RLS. Patient advised to minimize Clonazepam and Percocet as much as possible as they be contributing to her balance problems. Continue aspirin 325 mg orally every day for secondary stroke prevention and maintain strict control of hypertension with blood pressure goal below 130/90, diabetes with hemoglobin A1c goal below 6.5% and lipids with LDL cholesterol goal below 100 mg/dL.  Start Baclofen for muscle spasticity. Take 1/2 tablet three times a day for 1 week , then may increase to 1 tablet three times a day.  If this does not help, refer to Dr. Hosie Poisson for Botox inj. Refer to PT for gait and balance training. Followup in 3 months.  Orders Placed This Encounter  Procedures  . Ambulatory referral to Physical Therapy    Tawny Asal Brennin Durfee, MSN, NP-C 12/01/2012, 3:52 PM [facility] [address] [place], [place] [address] [phone]

## 2012-12-01 NOTE — Patient Instructions (Signed)
Start Baclofen for muscle spasticity. Take 1/2 tablet three times a day for 1 week , then may increase to 1 tablet three times a day.  If this does not help, we will do Botox injections for your shoulder.  We are referring you for Physical Therapy for gait and balance therapy.  Some will call you to schedule.  Follow up in 3 months.

## 2012-12-07 ENCOUNTER — Ambulatory Visit: Payer: Medicaid Other

## 2013-02-13 ENCOUNTER — Encounter: Payer: Self-pay | Admitting: Neurology

## 2013-02-13 ENCOUNTER — Telehealth: Payer: Self-pay | Admitting: Neurology

## 2013-02-13 NOTE — Telephone Encounter (Signed)
Called PT to advise that the appt for 2/10 @ 1:30 needed to be reschedule as the provider will not be available that day.  Advised that we had an opening on 2/18 @ 4:00 pm and she said that would work for her.  Sending letter to confirm the changes.

## 2013-02-28 ENCOUNTER — Ambulatory Visit: Payer: Self-pay | Admitting: Nurse Practitioner

## 2013-03-13 ENCOUNTER — Ambulatory Visit: Payer: Self-pay | Admitting: Nurse Practitioner

## 2013-03-21 ENCOUNTER — Ambulatory Visit: Payer: Self-pay | Admitting: Nurse Practitioner

## 2013-03-23 ENCOUNTER — Telehealth: Payer: Self-pay | Admitting: Neurology

## 2013-03-23 NOTE — Telephone Encounter (Signed)
PT called to reschedule the appointment she had to cancel due to weather.  The 1st available was in May.  PT states that she is having problems with her right arm and because of this she has fallen quite a bit.  She states she is not able to write, she has to use her left hand to hold her right arm up to put her key in the mailbox and she is right handed.  Please call and let her know if it would be possible to get her in sooner.  Thank you.

## 2013-03-23 NOTE — Telephone Encounter (Signed)
Patient has been scheduled for 03/04,confirmed

## 2013-04-04 ENCOUNTER — Ambulatory Visit (INDEPENDENT_AMBULATORY_CARE_PROVIDER_SITE_OTHER): Payer: Self-pay | Admitting: Nurse Practitioner

## 2013-04-04 ENCOUNTER — Encounter: Payer: Self-pay | Admitting: Nurse Practitioner

## 2013-04-04 VITALS — BP 111/71 | HR 72 | Wt 147.0 lb

## 2013-04-04 DIAGNOSIS — G8110 Spastic hemiplegia affecting unspecified side: Secondary | ICD-10-CM

## 2013-04-04 DIAGNOSIS — G2581 Restless legs syndrome: Secondary | ICD-10-CM

## 2013-04-04 DIAGNOSIS — M7500 Adhesive capsulitis of unspecified shoulder: Secondary | ICD-10-CM

## 2013-04-04 DIAGNOSIS — G8111 Spastic hemiplegia affecting right dominant side: Secondary | ICD-10-CM

## 2013-04-04 MED ORDER — BACLOFEN 20 MG PO TABS: 10.0000 mg | ORAL_TABLET | Freq: Three times a day (TID) | ORAL | Status: DC

## 2013-04-04 MED ORDER — OXYCODONE-ACETAMINOPHEN 5-325 MG PO TABS: 1.0000 | ORAL_TABLET | Freq: Four times a day (QID) | ORAL | Status: DC | PRN

## 2013-04-04 NOTE — Progress Notes (Signed)
PATIENT: Anna Lyons DOB: [birth year]   REASON FOR VISIT: follow up for stroke, right spastic hemiparesis HISTORY FROM: patient  HISTORY OF PRESENT ILLNESS: UPDATE 04/04/13 (LL): Patient comes in for revisit.  She has had increased pain in right shoulder and very decreased ROM, and feels like she is dragging her right foot, but denies falls.  States she feels like she lays in bed all day due to the pain in her right shoulder, then cannot sleep at night.  Denies falls since last visit.  BP is well controlled, is 111/71 in office today.  Tolerating Keppra and aspirin well without side effects.  UPDATE 12/01/12 (LL): Anna Lyons comes to office for stroke revisit. She reports that she has been having increased trouble with balance in the last couple months, with 3-4 falls in the last month which sent her to the ER. Repeat MRI did not show new stroke. She states she feels like she is going to the side when she walks. She has increased pain in right shoulder and decreased ROM, and feels like she is dragging her right foot sometimes. She is suffering with insomnia, waking frequently through the night, and was prescribed Clonazepam by her PCP. Her depression is much better since last visit.  UPDATE 08/28/12 (LL): Anna Lyons returns to office for follow up of seizures (2) which occurred on 05/19/12. They occurred while patient was staying with sick husband in hospital, was sleep deprived and stressed out. Husband subsequently died one month ago. Patient has not had anymore seizures, is maintained on Keppra 500mg  BID. No new neurovascular symptoms. Not sleeping well per daughter, patient has been very depressed since husband's death. Zoloft was increased by PCP. RLS symptoms worse, needs refill on Requip. Dr. Arelia Sneddon manages HTN and cholesterol, patient does not know last lab values. Daughter reports BP med was recently changed due to hypokalemia. Patient reportedly stating smoking again when husband was  terminal with cancer. Plans on quitting asap. Patient c/o right shoulder pain, weak from stroke and exacerbated by moving/carrying boxes.  Anna Lyons is a 58 y.o. 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 The first happened on the floor of the hospital that she was visiting her husband who was a patient. She is staying with him and was incontinent of both stool and urine and had what was reported to be should shaking activity. She was then taken to the emergency room where she was witnessed to have right head and eye deviation followed by tonic-clonic activity. She was given Ativan and Versed. MRI showed only evidence of a prior CVA, and EEG was unremarkable. The patient's seizure was likely a combination of a lowered seizure threshold, due to sleep deprivation in the context of visiting her hospitalized husband, and a predisposition to seizures, possibly related to her old CVA. The patient was started on Keppra, with no further seizure activity during the hospitalization.  02/17/12 PRIOR HPI (PS): 58 year old female was last seen in hospital for stroke or March of 2013. At that time she received TPA. Workup showed normal carotid Dopplers, 2-D echo showed 55-60% EF, negative CT angiogram extracranial vessels, negative CT brain, and positive stroke in the left posterior lentiform nucleus seen on MRI. She was discharged on aspirin 325 mg at that time. She was doing well until 11/16/2011 a.m. She awoke at 8 AM in normal  state. She ate breakfast at 10:30 and noted she had difficulty chewing her food. She brushed her teeth at 11 AM and noticed a right facial droop. No other symptoms. Initial CT head showed no acute infarct or bleed. Facial droop was still present. During further exam her facial droop improved and she is now back to her baseline. Initial NIH SS of 2  for right facial droop and pronator drift. CT of the brain 11/16/2011 remote left basal ganglia lacunar infarction. Mild white matter changes as above. No definite CT evidence for acute intracranial abnormality. MRI of the brain Kober 15 2013 chronic left hemisphere infarction as described. Small foci of restricted diffusion in the left periventricular region could represent acute or chronic ischemia. Mild atrophy and mild chronic microvascular ischemic change. Chest x-ray 11/16/2011 cardiomegaly without congestive failure.   REVIEW OF SYSTEMS: Full 14 system review of systems performed and notable only for:  constitutional: fatigue  ear/nose/throat: ringing in ears  musculoskeletal: joint pain, joint swelling, cramps, aching muscles  neurological: slurred speech  sleep: insomnia, restless legs  psychiatric: depression, not enough sleep, change in appetitie   ALLERGIES: Allergies  Allergen Reactions  . Codeine Itching    HOME MEDICATIONS: Outpatient Prescriptions Prior to Visit  Medication Sig Dispense Refill  . aspirin EC 325 MG tablet Take 325 mg by mouth daily.      [name] atorvastatin (LIPITOR) 80 MG tablet Take 80 mg by mouth daily.      . clonazePAM (KLONOPIN) 0.5 MG tablet Take 0.5 mg by mouth at bedtime.      . hydrOXYzine (VISTARIL) 25 MG capsule Take 25 mg by mouth daily as needed for anxiety.      . levETIRAcetam (KEPPRA) 500 MG tablet Take 500 mg by mouth every 12 (twelve) hours.      . meloxicam (MOBIC) 15 MG tablet Take 15 mg by mouth daily as needed for pain.      [name] rOPINIRole (REQUIP) 0.5 MG tablet Take 0.5 mg by mouth at bedtime.      . sertraline (ZOLOFT) 100 MG tablet Take 200 mg by mouth daily.       [name] triamterene-hydrochlorothiazide (MAXZIDE) 75-50 MG per tablet Take 0.5 tablets by mouth daily.      . baclofen (LIORESAL) 10 MG tablet Take 1 tablet (10 mg total) by mouth 3 (three) times daily.  90 each  5  . oxyCODONE-acetaminophen (PERCOCET/ROXICET) 5-325 MG per tablet  Take 1 tablet by mouth every 6 (six) hours as needed for pain.       No facility-administered medications prior to visit.    PAST MEDICAL HISTORY: Past Medical History  Diagnosis Date  . Depression   . Hypertension   . Angina   . Restless leg syndrome   . TIA (transient ischemic attack) 08/2010  . Stroke 04/20/2011    a. 04/20/11 Left subcortical infarct treated w/ TPA;  b. 04/2011 Echo: EF 55-60%;  c. 04/2011 Normal Carotid u/s  d. Residual "walk w/limp on right; unable to grasp w/right hand"  . Tobacco abuse     a. quit @ time of CVA 04/2011.  [name] RLS (restless legs syndrome) 11/16/2011  . Seizures     PAST SURGICAL HISTORY: Past Surgical History  Procedure Laterality Date  . Mastectomy  1980's    bilateral with reconstruction  . Breast reconstruction      bilaterally  . Cesarean section  1979  . Knee arthroscopy  1990's    left; cartilage repair  .  Carpal tunnel release  1990's    left    FAMILY HISTORY: Family History  Problem Relation Age of Onset  . Lupus Mother     died early 68's.  . Emphysema Father     died late 89's.  [name] COPD Father     SOCIAL HISTORY: History   Social History  . Marital Status: Widowed    Spouse Name: N/A    Number of Children: 1  . Years of Education: 12   Occupational History  . disabled    Social History Main Topics  . Smoking status: Current Every Day Smoker -- 0.50 packs/day for 37 years    Types: Cigarettes  . Smokeless tobacco: Never Used     Comment: quit 04/20/2011, restarted in May '14  . Alcohol Use: 8.4 oz/week    14 Glasses of wine per week  . Drug Use: No  . Sexual Activity: No   Other Topics Concern  . Not on file   Social History Narrative   Pt lives alone.   Caffeine Use: 1-3 cups of caffeine daily (tea)     PHYSICAL EXAM  Filed Vitals:   04/04/13 0930  BP: 111/71  Pulse: 72  Weight: 147 lb (66.679 kg)   Body mass index is 25.63 kg/(m^2).  Generalized: Patient is in obvious pain; right shoulder    Neck: Supple, no carotid bruits  Cardiac: Regular rate rhythm, no murmur  Pulmonary: Clear to auscultation bilaterally  Musculoskeletal: Mild right foot drop   Neurological examination  Mentation: Alert oriented to time, place, history taking, language fluent, with mild dysarthria. GDS 5.  Cranial nerve II-XII: Pupils were equal round reactive to light extraocular movements were full, visual field were full on confrontational test. facial sensation and strength were normal. hearing was intact to finger rubbing bilaterally. Uvula tongue midline. head turning and shoulder shrug and were normal and symmetric.Tongue protrusion into cheek strength was normal.  MOTOR: Increased tone on the right. Mild right upper and lower extremity drift with 3/5 strength weakness of RUE and intrinsic hand muscles. Right UE ROM severely limited by pain. Left orbits right arm.  SENSORY: normal and symmetric to light touch, temperature, vibration  COORDINATION: normal finger-nose-finger, heel-to-shin bilaterally  REFLEXES: increased on right side  GAIT/STATION: Rising up from seated position without assistance, right hemiplegic gait with cane, unable to perform tiptoe, and heel walking without difficulty. Romberg positive.  MRI HEAD WITHOUT CONTRAST 11/22/12 No acute intracranial abnormality or significant interval change. Stable remote infarcts of the basal ganglia and left groin radiata. Remote blood products associated with the left basal ganglia infarcts are stable. Atrophy and white matter disease is advanced for age. This likely reflects the sequela of chronic microvascular ischemia.   ASSESSMENT AND PLAN Anna Lyons is a 58 y.o. 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 The patient's seizure was likely a combination of a lowered seizure threshold, due to sleep deprivation in  the context of visiting her hospitalized husband, and a predisposition to seizures, possibly related to her old CVA. 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  PLAN:  Continue Keppra 500 mg BID. and Requip for RLS.  I am referring you to Dr. Ella Bodo for treatment of your frozen shoulder.  He will likely give you a pain injection and recommend physical therapy. Prescription for Percocet to take for pain, until you can see Dr.  Kirstens. Increase Baclofen for the muscle spasticity. Continue aspirin 325 mg orally every day for secondary stroke prevention and maintain strict control of hypertension with blood pressure goal below 130/90, diabetes with hemoglobin A1c goal below 6.5% and lipids with LDL cholesterol goal below 100 mg/dL.  Follow up in 6 months, sooner as needed.  Orders Placed This Encounter  Procedures  . Ambulatory referral to Physical Medicine Rehab   Meds ordered this encounter  Medications  . baclofen (LIORESAL) 20 MG tablet    Sig: Take 0.5 tablets (10 mg total) by mouth 3 (three) times daily.    Dispense:  90 each    Refill:  5    Start 5 mg three times a day, then increase by 5 mg after 1 week.    Order Specific Question:  Supervising Provider    Answer:  Garvin Fila [[id number]]  . oxyCODONE-acetaminophen (PERCOCET/ROXICET) 5-325 MG per tablet    Sig: Take 1 tablet by mouth every 6 (six) hours as needed for severe pain.    Dispense:  60 tablet    Refill:  0    Order Specific Question:  Supervising Provider    Answer:  Penni Bombard [[id number]]   Return in about 6 months (around 10/05/2013).  Anna Pali, MSN, NP-C 04/04/2013, 12:59 PM [facility] [address] [place], [place] [address] [phone]  Note: This document was prepared with digital dictation and possible smart phrase technology. Any transcriptional errors that result from this process are unintentional.

## 2013-04-04 NOTE — Patient Instructions (Signed)
I am referring you to Dr. Ella Bodo for treatment of your frozen shoulder.  He will likely give you a pain injection and recommend physical therapy.  I am giving you a prescription for Percocet to take for pain, until you can see Dr. Ella Bodo.  I am increasing your dose of Baclofen for the muscle spasticity.  Continue aspirin 325 mg orally every day for secondary stroke prevention and maintain strict control of hypertension with blood pressure goal below 130/90, diabetes with hemoglobin A1c goal below 6.5% and lipids with LDL cholesterol goal below 100 mg/dL.   Follow up in 6 months, sooner as needed.

## 2013-04-26 ENCOUNTER — Encounter: Payer: Self-pay | Admitting: Physical Medicine & Rehabilitation

## 2013-05-21 ENCOUNTER — Telehealth: Payer: Self-pay

## 2013-05-21 ENCOUNTER — Other Ambulatory Visit: Payer: Self-pay | Admitting: Neurology

## 2013-05-21 MED ORDER — OXYCODONE-ACETAMINOPHEN 5-325 MG PO TABS: 1.0000 | ORAL_TABLET | Freq: Four times a day (QID) | ORAL | Status: DC | PRN

## 2013-05-21 NOTE — Telephone Encounter (Signed)
Anna Lyons, lvm asking for a refill on a medication. I called her back and gave her the number to the correct office. She expressed understanding.

## 2013-05-21 NOTE — Telephone Encounter (Signed)
Patient requesting refill of Oxycodone script. Please call and advise patient.

## 2013-05-21 NOTE — Telephone Encounter (Signed)
Called pt to inform her that her Rx was ready to be picked up at the front desk and if she has any other problems, questions or concerns to call the office. Pt verbalized understanding. °

## 2013-06-18 ENCOUNTER — Encounter: Payer: 59 | Attending: Physical Medicine & Rehabilitation

## 2013-06-18 ENCOUNTER — Ambulatory Visit (HOSPITAL_BASED_OUTPATIENT_CLINIC_OR_DEPARTMENT_OTHER): Payer: 59 | Admitting: Physical Medicine & Rehabilitation

## 2013-06-18 ENCOUNTER — Encounter: Payer: Self-pay | Admitting: Physical Medicine & Rehabilitation

## 2013-06-18 VITALS — BP 107/65 | HR 97 | Resp 14 | Ht 65.0 in | Wt 153.6 lb

## 2013-06-18 DIAGNOSIS — M25519 Pain in unspecified shoulder: Secondary | ICD-10-CM | POA: Insufficient documentation

## 2013-06-18 DIAGNOSIS — I69351 Hemiplegia and hemiparesis following cerebral infarction affecting right dominant side: Secondary | ICD-10-CM

## 2013-06-18 DIAGNOSIS — F3289 Other specified depressive episodes: Secondary | ICD-10-CM | POA: Insufficient documentation

## 2013-06-18 DIAGNOSIS — I69959 Hemiplegia and hemiparesis following unspecified cerebrovascular disease affecting unspecified side: Secondary | ICD-10-CM

## 2013-06-18 DIAGNOSIS — M7500 Adhesive capsulitis of unspecified shoulder: Secondary | ICD-10-CM

## 2013-06-18 DIAGNOSIS — Z87891 Personal history of nicotine dependence: Secondary | ICD-10-CM | POA: Insufficient documentation

## 2013-06-18 DIAGNOSIS — F329 Major depressive disorder, single episode, unspecified: Secondary | ICD-10-CM | POA: Insufficient documentation

## 2013-06-18 DIAGNOSIS — M7501 Adhesive capsulitis of right shoulder: Secondary | ICD-10-CM | POA: Insufficient documentation

## 2013-06-18 DIAGNOSIS — I10 Essential (primary) hypertension: Secondary | ICD-10-CM | POA: Insufficient documentation

## 2013-06-18 NOTE — Progress Notes (Signed)
Subjective:    Patient ID: Anna Lyons, female    DOB: 1955-12-16, 58 y.o.   MRN: [id number] This is a 58 year old right-handed female   with history of transient ischemic attack, July 2012, who was admitted   March 19 with right-sided weakness and slurred speech. MRI showed acute   nonhemorrhagic infarction, involving the posterior left lentiform   nucleus as well as remote lacunar infarction in the basal ganglia   bilaterally. Carotid Dopplers with no internal carotid artery stenosis.   Echocardiogram with ejection fraction of 60% without wall motion   abnormalities. Negative for emboli. Cerebral angiogram with no   occlusion or dissection noted. The patient did receive tPA  HPI CC Right shoulder and hand problem Problems with using a key, decreased control in the hand Had 2 falls within the last year, knee laceration  Complains of Pain and  stiffness in the Right shoulder, pain is mainly in the back of the shoulder rather than the front of the shoulder Neck feels stiff, used to see a chiropractor but has not had adjustments since her stroke  Right leg feels heavier. Evaluated by neurology 2 months ago, no new stroke suspected, diagnosis of frozen shoulder.   Last visit was approximately 2 years ago. 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  Pain Inventory Average Pain 6 Pain Right Now 6 My pain is intermittent  In the last 24 hours, has pain interfered with the following? General activity 5 Relation with others 4 Enjoyment of life 7 What TIME of day is your pain at its worst? morning Sleep (in general) Fair  Pain is worse with: some activites Pain improves with: medication Relief from Meds: 5  Mobility do you drive?  yes transfers alone  Function disabled: date disabled 04/2011  Neuro/Psych numbness spasms  Prior Studies Any changes  since last visit?  no CLINICAL DATA:  Right-sided facial asymmetry. New onset abnormal speech and dizziness. And personal history of stroke with residual right upper and lower extremity weakness. Less seen well yesterday.   EXAM: MRI HEAD WITHOUT CONTRAST   TECHNIQUE: Multiplanar, multisequence MR imaging was performed. No intravenous contrast was administered.   COMPARISON:  CT head without contrast 10/29/2012. MRI brain 05/19/2012.   FINDINGS: Mild generalized atrophy and white matter disease is stable. Remote infarcts of the basal ganglia and left coronal radiata are stable. No hemorrhage or mass lesion is present. No acute infarct is evident. The ventricles are proportionate to the degree of atrophy and stable. No significant extra-axial fluid collection is present.   Flow is present in the major intracranial arteries. The globes and orbits are intact. The paranasal sinuses are clear. Minimal fluid in the right mastoid air cells is similar to the prior study. Remote blood products are again noted with the left basal ganglia infarcts. No new hemorrhage is present   IMPRESSION: 1. No acute intracranial abnormality or significant interval change. 2. Stable remote infarcts of the basal ganglia and left groin radiata. 3. Remote blood products associated with the left basal ganglia infarcts are stable. 4. Atrophy and white matter disease is advanced for age. This likely reflects the sequela of chronic microvascular ischemia.  Physicians involved in your care Any changes since last visit?  no   Family History  Problem Relation Age of Onset  . Lupus Mother  died early 59's.  . Emphysema Father     died late 19's.  [name] COPD Father    History   Social History  . Marital Status: Widowed    Spouse Name: N/A    Number of Children: 1  . Years of Education: 12   Occupational History  . disabled    Social History Main Topics  . Smoking status: Current Every Day  Smoker -- 0.50 packs/day for 37 years    Types: Cigarettes  . Smokeless tobacco: Never Used     Comment: quit 04/20/2011, restarted in May '14  . Alcohol Use: 8.4 oz/week    14 Glasses of wine per week  . Drug Use: No  . Sexual Activity: No   Other Topics Concern  . None   Social History Narrative   Pt lives alone.   Caffeine Use: 1-3 cups of caffeine daily (tea)   Past Surgical History  Procedure Laterality Date  . Mastectomy  1980's    bilateral with reconstruction  . Breast reconstruction      bilaterally  . Cesarean section  1979  . Knee arthroscopy  1990's    left; cartilage repair  . Carpal tunnel release  1990's    left   Past Medical History  Diagnosis Date  . Depression   . Hypertension   . Angina   . Restless leg syndrome   . TIA (transient ischemic attack) 08/2010  . Stroke 04/20/2011    a. 04/20/11 Left subcortical infarct treated w/ TPA;  b. 04/2011 Echo: EF 55-60%;  c. 04/2011 Normal Carotid u/s  d. Residual "walk w/limp on right; unable to grasp w/right hand"  . Tobacco abuse     a. quit @ time of CVA 04/2011.  [name] RLS (restless legs syndrome) 11/16/2011  . Seizures    BP 107/65  Pulse 97  Resp 14  Ht 5\' 5"  (1.651 m)  Wt 153 lb 9.6 oz (69.673 kg)  BMI 25.56 kg/m2  SpO2 95%  Opioid Risk Score: 5 Fall Risk Score: Moderate Fall Risk (6-13 points) (educated and handout given for fall prevention in the home)  Review of Systems  Musculoskeletal:       Frozen right shoulder/ spasms  Neurological: Positive for numbness.  All other systems reviewed and are negative.      Objective:   Physical Exam Right side Can reach behind back touching right iliac crest Can reach behind the head touching C7 spinous process  Left side Can reach behind back touching T12 spinous process Can reach behind the head touching T3 spinous process  Motor strength 4/5 in the right deltoid, bicep, tricep, grip 5/5 in the left deltoid, bicep, tricep and grip  Right hip  flexor 4/5, knee extensor ankle dorsiflexor 5/5      Assessment & Plan:  1.  right frozen shoulder, recommend a series of 3 intra-articular injections. If the first injection is not helpful without ultrasound guidance will add ultrasound guidance, will re [facility] PT and OT. OT may work on the shoulder. She also has some hip flexor weakness as well as range of motion deficits that may be contributing to problems with gait which physical therapy can address  Shoulder injection Right   Indication:Right Shoulder pain not relieved by medication management and other conservative care.  Informed consent was obtained after describing risks and benefits of the procedure with the patient, this includes bleeding, bruising, infection and medication side effects. The patient wishes to proceed and has given written  consent. Patient was placed in a seated position. The Right shoulder was marked and prepped with betadine in the subacromial area. A 25-gauge 1-1/2 inch needle was inserted into the subacromial area. After negative draw back for blood, a solution containing 1 mL of 6 mg per ML betamethasone and 4 mL of 1% lidocaine was injected. A band aid was applied. The patient tolerated the procedure well. Post procedure instructions were given.

## 2013-07-12 ENCOUNTER — Ambulatory Visit: Payer: Self-pay | Admitting: Physical Medicine & Rehabilitation

## 2013-08-15 ENCOUNTER — Emergency Department (HOSPITAL_COMMUNITY): Payer: Medicaid Other

## 2013-08-15 ENCOUNTER — Observation Stay (HOSPITAL_COMMUNITY)
Admission: EM | Admit: 2013-08-15 | Discharge: 2013-08-17 | Disposition: A | Discharge status: Home / Self Care | Payer: Medicaid Other | Attending: Family Medicine | Admitting: Family Medicine

## 2013-08-15 ENCOUNTER — Encounter (HOSPITAL_COMMUNITY): Payer: Self-pay | Admitting: Emergency Medicine

## 2013-08-15 DIAGNOSIS — I214 Non-ST elevation (NSTEMI) myocardial infarction: Secondary | ICD-10-CM

## 2013-08-15 DIAGNOSIS — R5383 Other fatigue: Secondary | ICD-10-CM | POA: Diagnosis not present

## 2013-08-15 DIAGNOSIS — M7501 Adhesive capsulitis of right shoulder: Secondary | ICD-10-CM

## 2013-08-15 DIAGNOSIS — Z79899 Other long term (current) drug therapy: Secondary | ICD-10-CM | POA: Insufficient documentation

## 2013-08-15 DIAGNOSIS — I200 Unstable angina: Secondary | ICD-10-CM

## 2013-08-15 DIAGNOSIS — F3289 Other specified depressive episodes: Secondary | ICD-10-CM | POA: Insufficient documentation

## 2013-08-15 DIAGNOSIS — I69351 Hemiplegia and hemiparesis following cerebral infarction affecting right dominant side: Secondary | ICD-10-CM

## 2013-08-15 DIAGNOSIS — Z7982 Long term (current) use of aspirin: Secondary | ICD-10-CM | POA: Insufficient documentation

## 2013-08-15 DIAGNOSIS — Z885 Allergy status to narcotic agent status: Secondary | ICD-10-CM | POA: Insufficient documentation

## 2013-08-15 DIAGNOSIS — F329 Major depressive disorder, single episode, unspecified: Secondary | ICD-10-CM | POA: Diagnosis not present

## 2013-08-15 DIAGNOSIS — G2581 Restless legs syndrome: Secondary | ICD-10-CM

## 2013-08-15 DIAGNOSIS — G40909 Epilepsy, unspecified, not intractable, without status epilepticus: Secondary | ICD-10-CM | POA: Diagnosis present

## 2013-08-15 DIAGNOSIS — R5381 Other malaise: Secondary | ICD-10-CM | POA: Insufficient documentation

## 2013-08-15 DIAGNOSIS — R404 Transient alteration of awareness: Secondary | ICD-10-CM | POA: Insufficient documentation

## 2013-08-15 DIAGNOSIS — I10 Essential (primary) hypertension: Secondary | ICD-10-CM | POA: Diagnosis not present

## 2013-08-15 DIAGNOSIS — F17200 Nicotine dependence, unspecified, uncomplicated: Secondary | ICD-10-CM | POA: Diagnosis not present

## 2013-08-15 DIAGNOSIS — Z8673 Personal history of transient ischemic attack (TIA), and cerebral infarction without residual deficits: Secondary | ICD-10-CM | POA: Insufficient documentation

## 2013-08-15 DIAGNOSIS — R42 Dizziness and giddiness: Principal | ICD-10-CM | POA: Insufficient documentation

## 2013-08-15 DIAGNOSIS — R9431 Abnormal electrocardiogram [ECG] [EKG]: Secondary | ICD-10-CM

## 2013-08-15 DIAGNOSIS — G459 Transient cerebral ischemic attack, unspecified: Secondary | ICD-10-CM | POA: Diagnosis present

## 2013-08-15 DIAGNOSIS — E876 Hypokalemia: Secondary | ICD-10-CM

## 2013-08-15 DIAGNOSIS — Z720 Tobacco use: Secondary | ICD-10-CM | POA: Diagnosis present

## 2013-08-15 DIAGNOSIS — R110 Nausea: Secondary | ICD-10-CM | POA: Diagnosis not present

## 2013-08-15 DIAGNOSIS — G8191 Hemiplegia, unspecified affecting right dominant side: Secondary | ICD-10-CM

## 2013-08-15 DIAGNOSIS — R400 Somnolence: Secondary | ICD-10-CM

## 2013-08-15 DIAGNOSIS — F32A Depression, unspecified: Secondary | ICD-10-CM

## 2013-08-15 DIAGNOSIS — R569 Unspecified convulsions: Secondary | ICD-10-CM

## 2013-08-15 DIAGNOSIS — R4182 Altered mental status, unspecified: Secondary | ICD-10-CM | POA: Diagnosis present

## 2013-08-15 LAB — I-STAT ARTERIAL BLOOD GAS, ED
ACID-BASE EXCESS: 7 mmol/L — AB (ref 0.0–2.0)
Acid-Base Excess: 6 mmol/L — ABNORMAL HIGH (ref 0.0–2.0)
Bicarbonate: 31.3 mEq/L — ABNORMAL HIGH (ref 20.0–24.0)
Bicarbonate: 32.4 mEq/L — ABNORMAL HIGH (ref 20.0–24.0)
O2 SAT: 71 %
O2 SAT: 91 %
PCO2 ART: 50.4 mmHg — AB (ref 35.0–45.0)
Patient temperature: 98.6
Patient temperature: 98.6
TCO2: 33 mmol/L (ref 0–100)
TCO2: 34 mmol/L (ref 0–100)
pCO2 arterial: 48.5 mmHg — ABNORMAL HIGH (ref 35.0–45.0)
pH, Arterial: 7.415 (ref 7.350–7.450)
pH, Arterial: 7.418 (ref 7.350–7.450)
pO2, Arterial: 38 mmHg — CL (ref 80.0–100.0)
pO2, Arterial: 61 mmHg — ABNORMAL LOW (ref 80.0–100.0)

## 2013-08-15 LAB — RAPID URINE DRUG SCREEN, HOSP PERFORMED
Amphetamines: NOT DETECTED
Barbiturates: NOT DETECTED
Benzodiazepines: POSITIVE — AB
Cocaine: NOT DETECTED
Opiates: NOT DETECTED
Tetrahydrocannabinol: NOT DETECTED

## 2013-08-15 LAB — URINALYSIS, ROUTINE W REFLEX MICROSCOPIC
BILIRUBIN URINE: NEGATIVE
Glucose, UA: NEGATIVE mg/dL
HGB URINE DIPSTICK: NEGATIVE
Ketones, ur: NEGATIVE mg/dL
NITRITE: NEGATIVE
PROTEIN: NEGATIVE mg/dL
Specific Gravity, Urine: 1.019 (ref 1.005–1.030)
Urobilinogen, UA: 0.2 mg/dL (ref 0.0–1.0)
pH: 6 (ref 5.0–8.0)

## 2013-08-15 LAB — CBC WITH DIFFERENTIAL/PLATELET
BASOS ABS: 0 10*3/uL (ref 0.0–0.1)
BASOS PCT: 0 % (ref 0–1)
EOS ABS: 0.1 10*3/uL (ref 0.0–0.7)
Eosinophils Relative: 2 % (ref 0–5)
HCT: 35.5 % — ABNORMAL LOW (ref 36.0–46.0)
HEMOGLOBIN: 12.2 g/dL (ref 12.0–15.0)
Lymphocytes Relative: 33 % (ref 12–46)
Lymphs Abs: 1.9 10*3/uL (ref 0.7–4.0)
MCH: 30.6 pg (ref 26.0–34.0)
MCHC: 34.4 g/dL (ref 30.0–36.0)
MCV: 89 fL (ref 78.0–100.0)
Monocytes Absolute: 0.4 10*3/uL (ref 0.1–1.0)
Monocytes Relative: 7 % (ref 3–12)
NEUTROS PCT: 58 % (ref 43–77)
Neutro Abs: 3.3 10*3/uL (ref 1.7–7.7)
PLATELETS: 162 10*3/uL (ref 150–400)
RBC: 3.99 MIL/uL (ref 3.87–5.11)
RDW: 12.4 % (ref 11.5–15.5)
WBC: 5.8 10*3/uL (ref 4.0–10.5)

## 2013-08-15 LAB — BASIC METABOLIC PANEL
ANION GAP: 15 (ref 5–15)
BUN: 11 mg/dL (ref 6–23)
CO2: 27 mEq/L (ref 19–32)
Calcium: 9.3 mg/dL (ref 8.4–10.5)
Chloride: 98 mEq/L (ref 96–112)
Creatinine, Ser: 0.64 mg/dL (ref 0.50–1.10)
Glucose, Bld: 76 mg/dL (ref 70–99)
POTASSIUM: 3.8 meq/L (ref 3.7–5.3)
SODIUM: 140 meq/L (ref 137–147)

## 2013-08-15 LAB — I-STAT TROPONIN, ED: TROPONIN I, POC: 0.01 ng/mL (ref 0.00–0.08)

## 2013-08-15 LAB — URINE MICROSCOPIC-ADD ON

## 2013-08-15 LAB — I-STAT CG4 LACTIC ACID, ED: LACTIC ACID, VENOUS: 1.02 mmol/L (ref 0.5–2.2)

## 2013-08-15 NOTE — ED Notes (Signed)
The pt passed a swallow screen

## 2013-08-15 NOTE — ED Notes (Signed)
Chest xray completed.

## 2013-08-15 NOTE — ED Notes (Signed)
The pt is now reporting that she was never dizzy she just felt drunk

## 2013-08-15 NOTE — ED Notes (Signed)
The pt returned from c-t 

## 2013-08-15 NOTE — ED Notes (Signed)
Pt to the ct scanner

## 2013-08-15 NOTE — ED Notes (Signed)
The daughter has just arrived and reports that the pt lives alone and gets around without any assistance.  She is suppose to use a cane but she prefers not to use one.  She has had  A stroke and 2 tias in the past.  The pt has a problem using her rt hand and sometimes drags her rt foot.,  Her daughter talked to her on the phone earlier today and she was c/o dizziness then.  At present the pt keeps both eyes closed unless she is asked to open them

## 2013-08-15 NOTE — ED Notes (Signed)
The pt arrived by gems from home .  The pt has been sluggish for the past 3 weeks.  Old stroke with rt sided residual. The pts speech is clear she is asking for her daughter.  cbg enroute that is 80.  She was given narcan  Iv  ?? Better afterward.  Iv per ems

## 2013-08-15 NOTE — ED Notes (Signed)
Pt placed into gown and on monitor upon arrival to room. Pt monitored by 12 lead, blood pressure, and pulse ox. Pts EKG given to Dr.  Jeneen Rinks. Inserted in and out cath with the assistance from [place], [place].

## 2013-08-15 NOTE — ED Notes (Signed)
The pt is talking to her daughter.  No complaints

## 2013-08-16 ENCOUNTER — Observation Stay (HOSPITAL_COMMUNITY): Payer: Medicaid Other

## 2013-08-16 DIAGNOSIS — F17200 Nicotine dependence, unspecified, uncomplicated: Secondary | ICD-10-CM

## 2013-08-16 DIAGNOSIS — G459 Transient cerebral ischemic attack, unspecified: Secondary | ICD-10-CM | POA: Diagnosis present

## 2013-08-16 DIAGNOSIS — R569 Unspecified convulsions: Secondary | ICD-10-CM

## 2013-08-16 DIAGNOSIS — I10 Essential (primary) hypertension: Secondary | ICD-10-CM

## 2013-08-16 DIAGNOSIS — R42 Dizziness and giddiness: Secondary | ICD-10-CM

## 2013-08-16 LAB — LIPID PANEL
CHOL/HDL RATIO: 2.5 ratio
CHOLESTEROL: 94 mg/dL (ref 0–200)
HDL: 38 mg/dL — ABNORMAL LOW (ref >39)
LDL Cholesterol: 40 mg/dL (ref 0–99)
Triglycerides: 81 mg/dL (ref <150)
VLDL: 16 mg/dL (ref 0–40)

## 2013-08-16 LAB — GLUCOSE, CAPILLARY: Glucose-Capillary: 95 mg/dL (ref 70–99)

## 2013-08-16 LAB — HEMOGLOBIN A1C
Hgb A1c MFr Bld: 5.8 % — ABNORMAL HIGH (ref <5.7)
Mean Plasma Glucose: 120 mg/dL — ABNORMAL HIGH (ref <117)

## 2013-08-16 MED ORDER — TRIAMTERENE-HCTZ 37.5-25 MG PO TABS
0.5000 | ORAL_TABLET | Freq: Every day | ORAL | Status: DC
  Administered 2013-08-17: 0.5 via ORAL
  Filled 2013-08-16 (×2): qty 0.5

## 2013-08-16 MED ORDER — HYDROXYZINE HCL 25 MG PO TABS: 25.0000 mg | ORAL_TABLET | Freq: Every day | ORAL | Status: DC | PRN

## 2013-08-16 MED ORDER — SERTRALINE HCL 100 MG PO TABS
200.0000 mg | ORAL_TABLET | Freq: Every day | ORAL | Status: DC
  Administered 2013-08-16 – 2013-08-17 (×2): 200 mg via ORAL
  Filled 2013-08-16 (×2): qty 2

## 2013-08-16 MED ORDER — BACLOFEN 10 MG PO TABS
10.0000 mg | ORAL_TABLET | Freq: Three times a day (TID) | ORAL | Status: DC
  Administered 2013-08-16 – 2013-08-17 (×4): 10 mg via ORAL
  Filled 2013-08-16 (×6): qty 1

## 2013-08-16 MED ORDER — ATORVASTATIN CALCIUM 80 MG PO TABS
80.0000 mg | ORAL_TABLET | Freq: Every day | ORAL | Status: DC
  Administered 2013-08-16 – 2013-08-17 (×2): 80 mg via ORAL
  Filled 2013-08-16 (×2): qty 1

## 2013-08-16 MED ORDER — ENOXAPARIN SODIUM 40 MG/0.4ML ~~LOC~~ SOLN
40.0000 mg | Freq: Every day | SUBCUTANEOUS | Status: DC
  Administered 2013-08-16 – 2013-08-17 (×2): 40 mg via SUBCUTANEOUS
  Filled 2013-08-16 (×2): qty 0.4

## 2013-08-16 MED ORDER — TRIAMTERENE-HCTZ 37.5-25 MG PO TABS
1.0000 | ORAL_TABLET | Freq: Every day | ORAL | Status: DC
  Administered 2013-08-16: 1 via ORAL
  Filled 2013-08-16: qty 1

## 2013-08-16 MED ORDER — CEFTRIAXONE SODIUM 1 G IJ SOLR
1.0000 g | Freq: Once | INTRAMUSCULAR | Status: AC
Start: 2013-08-16 — End: 2013-08-16
  Administered 2013-08-16: 1 g via INTRAVENOUS
  Filled 2013-08-16: qty 10

## 2013-08-16 MED ORDER — STROKE: EARLY STAGES OF RECOVERY BOOK
Freq: Once | Status: AC
Start: 2013-08-16 — End: 2013-08-17
  Filled 2013-08-16: qty 1

## 2013-08-16 MED ORDER — ASPIRIN EC 325 MG PO TBEC
325.0000 mg | DELAYED_RELEASE_TABLET | Freq: Every day | ORAL | Status: DC
  Administered 2013-08-16 – 2013-08-17 (×2): 325 mg via ORAL
  Filled 2013-08-16 (×2): qty 1

## 2013-08-16 MED ORDER — LEVETIRACETAM 500 MG PO TABS
500.0000 mg | ORAL_TABLET | Freq: Two times a day (BID) | ORAL | Status: DC
  Administered 2013-08-16 – 2013-08-17 (×3): 500 mg via ORAL
  Filled 2013-08-16 (×4): qty 1

## 2013-08-16 NOTE — ED Notes (Signed)
Sleeping intermittently

## 2013-08-16 NOTE — Progress Notes (Signed)
Subjective: Patient seen and examined, admitted with dizziness. Dizziness has now resolved, MRI is negative for stroke. BP has been in low 100's, orthostatics negative.  Filed Vitals:   08/16/13 0548  BP: 130/80  Pulse: 57  Temp: 97.7 F (36.5 C)  Resp: 16    Chest: Clear Bilaterally Heart : S1S2 RRR Abdomen: Soft, nontender Ext : No edema Neuro: Alert, oriented x 3  A/P Dizziness ? UTI  MRI negative for stroke, EEG is pending continue with IV Rocephin for UTI, urine culture is pending.   [place] [facility] [phone]

## 2013-08-16 NOTE — ED Provider Notes (Signed)
CSN: [id number]     Arrival date & time 08/15/13  2215 History   First MD Initiated Contact with Patient 08/15/13 2224     Chief Complaint  Patient presents with  . Altered Mental Status      HPI  Patient presents with a decreased level of consciousness, weakness, and "a drunk feeling".  Upon arrival patient is minimally verbally responsive. However over the course of her ER stay becomes more awake and aware and able to give more details. She has not been feeling well for the past 3 weeks feeling weak. Daughter states that she spoke with the patient at 5:00 this afternoon and her speech was a little slurred, which is not uncommon since the previous stroke.  Daughter was at her church tonight when she received a call from her mother.. She stated that she had gotten tired was sitting on her porch. She states that she felt "drunk" and she couldn't get up and walk because things were spinning around. Ultimately she called paramedics. Paramedics arrived to find her sitting in her chair onto porch. Her head was down and she was unresponsive to them. She was breathing. Showed normal sugar. Did not seem postictal.  She was able to lift her head no blood or eyes. When she attempted to stand she felt that she was off balance. Transferred by paramedics. Had a normal blood sugar. No change with Narcan.  Past Medical History  Diagnosis Date  . Depression   . Hypertension   . Angina   . Restless leg syndrome   . TIA (transient ischemic attack) 08/2010  . Stroke 04/20/2011    a. 04/20/11 Left subcortical infarct treated w/ TPA;  b. 04/2011 Echo: EF 55-60%;  c. 04/2011 Normal Carotid u/s  d. Residual "walk w/limp on right; unable to grasp w/right hand"  . Tobacco abuse     a. quit @ time of CVA 04/2011.  [name] RLS (restless legs syndrome) 11/16/2011  . Seizures    Past Surgical History  Procedure Laterality Date  . Mastectomy  1980's    bilateral with reconstruction  . Breast reconstruction     bilaterally  . Cesarean section  1979  . Knee arthroscopy  1990's    left; cartilage repair  . Carpal tunnel release  1990's    left   Family History  Problem Relation Age of Onset  . Lupus Mother     died early 80's.  . Emphysema Father     died late 28's.  [name] COPD Father    History  Substance Use Topics  . Smoking status: Current Every Day Smoker -- 0.50 packs/day for 37 years    Types: Cigarettes  . Smokeless tobacco: Never Used     Comment: quit 04/20/2011, restarted in May '14  . Alcohol Use: 8.4 oz/week    14 Glasses of wine per week   OB History   Grav Para Term Preterm Abortions TAB SAB Ect Mult Living                 Review of Systems  Constitutional: Positive for fatigue. Negative for fever, chills, diaphoresis and appetite change.  HENT: Negative for mouth sores, sore throat and trouble swallowing.   Eyes: Negative for visual disturbance.  Respiratory: Negative for cough, chest tightness, shortness of breath and wheezing.   Cardiovascular: Negative for chest pain.  Gastrointestinal: Positive for nausea. Negative for vomiting, abdominal pain, diarrhea and abdominal distention.  Endocrine: Negative for polydipsia, polyphagia and polyuria.  Genitourinary: Negative for dysuria, frequency and hematuria.  Musculoskeletal: Negative for gait problem.  Skin: Negative for color change, pallor and rash.  Neurological: Positive for dizziness and weakness. Negative for syncope, light-headedness and headaches.  Hematological: Does not bruise/bleed easily.  Psychiatric/Behavioral: Negative for behavioral problems and confusion.      Allergies  Codeine  Home Medications   Prior to Admission medications   Medication Sig Start Date End Date Taking? Authorizing Provider  aspirin EC 325 MG tablet Take 325 mg by mouth daily.   Yes Historical Provider, MD  atorvastatin (LIPITOR) 80 MG tablet Take 80 mg by mouth daily.   Yes Historical Provider, MD  baclofen (LIORESAL) 20 MG  tablet Take 0.5 tablets (10 mg total) by mouth 3 (three) times daily. 04/04/13  Yes Philmore Pali, NP  clonazePAM (KLONOPIN) 1 MG tablet Take 1 mg by mouth at bedtime.   Yes Historical Provider, MD  hydrOXYzine (VISTARIL) 25 MG capsule Take 25 mg by mouth daily as needed for anxiety.   Yes Historical Provider, MD  levETIRAcetam (KEPPRA) 500 MG tablet Take 500 mg by mouth every 12 (twelve) hours.   Yes Historical Provider, MD  sertraline (ZOLOFT) 100 MG tablet Take 200 mg by mouth daily.    Yes Historical Provider, MD  triamterene-hydrochlorothiazide (MAXZIDE) 75-50 MG per tablet Take 0.5 tablets by mouth daily.   Yes Historical Provider, MD   BP 123/64  Pulse 62  Temp(Src) 98.2 F (36.8 C) (Oral)  Resp 18  Ht 5\' 3"  (1.6 m)  Wt 165 lb (74.844 kg)  BMI 29.24 kg/m2  SpO2 90% Physical Exam  Constitutional:  Initially with her eyes closed and not verbally responsive. With an ammonia capsule she opens her eyes and is able to speak more and has began asking for her daughter.  HENT:  No scleral icterus. Conjunctiva not pale  Eyes:  No nystagmus  Neck:  No JVD  Cardiovascular:  Is not tachycardic.  Pulmonary/Chest:  Clear lungs  Abdominal:  Soft benign abdomen  Neurological:  Oriented to person and place. Initially did not know the date. Ultimately remembers the date because she states "tomorrow is my husband's birthday". No cranial nerve deficits. No facial droop. No pronator drift or asymmetry to her upper extremities. Able to lift each leg from the bed independently.    ED Course  Procedures (including critical care time) Labs Review Labs Reviewed  CBC WITH DIFFERENTIAL - Abnormal; Notable for the following:    HCT 35.5 (*)    All other components within normal limits  URINALYSIS, ROUTINE W REFLEX MICROSCOPIC - Abnormal; Notable for the following:    APPearance CLOUDY (*)    Leukocytes, UA LARGE (*)    All other components within normal limits  URINE RAPID DRUG SCREEN (HOSP  PERFORMED) - Abnormal; Notable for the following:    Benzodiazepines POSITIVE (*)    All other components within normal limits  URINE MICROSCOPIC-ADD ON - Abnormal; Notable for the following:    Bacteria, UA FEW (*)    Casts HYALINE CASTS (*)    All other components within normal limits  HEMOGLOBIN A1C - Abnormal; Notable for the following:    Hemoglobin A1C 5.8 (*)    Mean Plasma Glucose 120 (*)    All other components within normal limits  LIPID PANEL - Abnormal; Notable for the following:    HDL 38 (*)    All other components within normal limits  I-STAT ARTERIAL BLOOD GAS, ED - Abnormal; Notable for  the following:    pCO2 arterial 50.4 (*)    pO2, Arterial 38.0 (*)    Bicarbonate 32.4 (*)    Acid-Base Excess 7.0 (*)    All other components within normal limits  I-STAT ARTERIAL BLOOD GAS, ED - Abnormal; Notable for the following:    pCO2 arterial 48.5 (*)    pO2, Arterial 61.0 (*)    Bicarbonate 31.3 (*)    Acid-Base Excess 6.0 (*)    All other components within normal limits  URINE CULTURE  BASIC METABOLIC PANEL  GLUCOSE, CAPILLARY  I-STAT CG4 LACTIC ACID, ED  I-STAT TROPOININ, ED    Imaging Review No results found.   EKG Interpretation   Date/Time:  Wednesday August 15 2013 22:22:08 EDT Ventricular Rate:  64 PR Interval:  181 QRS Duration: 98 QT Interval:  488 QTC Calculation: 504 R Axis:   -65 Text Interpretation:  Sinus rhythm Left anterior fascicular block  Borderline T abnormalities, diffuse leads Minimal ST elevation, lateral  leads Borderline prolonged QT interval ED PHYSICIAN INTERPRETATION  AVAILABLE IN [facility] Confirmed by TEST, Record ([id number]) on  08/17/2013 10:01:13 AM      MDM   Final diagnoses:  Dizziness  Somnolence    On reexamination patient is able to sit upright. Denies vertigo. Still feels generalized weakness. Studies show pyuria. Culture pending. CT head per my read shows no hemorrhage. 4 radiology report pending. She may have  UTI that may explain her symptoms for the last few weeks. Her episode tonight they have been to peripheral vertigo. This may be vertebrobasilar TIA. I've given her IV antibiotics. I placed a call to hospitalist regarding admission, and neurology regarding consultation.  01:04:  With Dr. Doy Mince. She recommends MRI. She thought this was acceptable to be done in the a.m. hasn't patient is asymptomatic now. She will see the patient in consult.    Tanna Furry, MD 08/20/13 1444

## 2013-08-16 NOTE — Procedures (Signed)
EEG report.  Brief clinical history:  58 y.o. female who is a poor historian and is admitted to the hospital due to acute onset dizziness and mental state changes. Patient with a history of seizures.      Technique: this is a 17 channel routine scalp EEG performed at the bedside with bipolar and monopolar montages arranged in accordance to the international 10/20 system of electrode placement. One channel was dedicated to EKG recording.  The study was performed during wakefulness, drowsiness, and stage 2 sleep. No activating procedures performed.  Description: As the study begins, patient falls into drowsiness and shortly thereafter stage 2 sleep pattern is evident. There is not abnormalities noted during drowsiness and sleep. Almost at the end of the study patent gets into a short period of wakefulness that reveals a best background that consisted of a medium amplitude, posterior dominant, well sustained, symmetric and reactive 8 Hz rhythm. No focal or generalized epileptiform discharges noted.  No pathologic areas of slowing seen.  EKG showed sinus rhythm.  Impression: this is a normal awake and asleep EEG. Please, be aware that a normal EEG does not exclude the possibility of epilepsy.  Clinical correlation is advised.  Dorian Pod, MD

## 2013-08-16 NOTE — Progress Notes (Signed)
Utilization review completed.  

## 2013-08-16 NOTE — ED Notes (Signed)
Pt requesting a bedpan.  Unable to void at present

## 2013-08-16 NOTE — Consult Note (Signed)
Referring Physician: Arnoldo Morale    Chief Complaint: Dizziness  HPI: Anna Lyons is an 58 y.o. female with a history of stroke and TIA.  Patient reports that she has been feeling dizzy for the past week with standing.  Reports that last evening while seated on the porch she became acutely dizzy.  She spoke with her daughter nad EMS was called.  It seems that she was quite lethargic on their arrival and remained lethargic in the ED with some improvement in mental status prior to getting a bed.  Consult was called for further recommendations.    Date last known well: Unable to determine Time last known well: Unable to determine tPA Given: No: Unclear LKW  Past Medical History  Diagnosis Date  . Depression   . Hypertension   . Angina   . Restless leg syndrome   . TIA (transient ischemic attack) 08/2010  . Stroke 04/20/2011    a. 04/20/11 Left subcortical infarct treated w/ TPA;  b. 04/2011 Echo: EF 55-60%;  c. 04/2011 Normal Carotid u/s  d. Residual "walk w/limp on right; unable to grasp w/right hand"  . Tobacco abuse     a. quit @ time of CVA 04/2011.  [name] RLS (restless legs syndrome) 11/16/2011  . Seizures     Past Surgical History  Procedure Laterality Date  . Mastectomy  1980's    bilateral with reconstruction  . Breast reconstruction      bilaterally  . Cesarean section  1979  . Knee arthroscopy  1990's    left; cartilage repair  . Carpal tunnel release  1990's    left    Family History  Problem Relation Age of Onset  . Lupus Mother     died early 41's.  . Emphysema Father     died late 56's.  [name] COPD Father    Social History:  reports that she has been smoking Cigarettes.  She has a 18.5 pack-year smoking history. She has never used smokeless tobacco. She reports that she drinks about 8.4 ounces of alcohol per week. She reports that she does not use illicit drugs.  Allergies:  Allergies  Allergen Reactions  . Codeine Itching    Medications:  I have reviewed the patient's  current medications. Prior to Admission:  Prescriptions prior to admission  Medication Sig Dispense Refill  . aspirin EC 325 MG tablet Take 325 mg by mouth daily.      . baclofen (LIORESAL) 20 MG tablet Take 0.5 tablets (10 mg total) by mouth 3 (three) times daily.  90 each  5  . clonazePAM (KLONOPIN) 1 MG tablet Take 1 mg by mouth at bedtime.      . hydrOXYzine (VISTARIL) 25 MG capsule Take 25 mg by mouth daily as needed for anxiety.      . levETIRAcetam (KEPPRA) 500 MG tablet Take 500 mg by mouth every 12 (twelve) hours.      . sertraline (ZOLOFT) 100 MG tablet Take 200 mg by mouth daily.       [name] triamterene-hydrochlorothiazide (MAXZIDE) 75-50 MG per tablet Take 0.5 tablets by mouth daily.      [name] atorvastatin (LIPITOR) 80 MG tablet Take 80 mg by mouth daily.       Scheduled: .  stroke: mapping our early stages of recovery book   Does not apply Once  . aspirin EC  325 mg Oral Daily  . atorvastatin  80 mg Oral Daily  . baclofen  10 mg Oral TID  .  enoxaparin (LOVENOX) injection  40 mg Subcutaneous Daily  . levETIRAcetam  500 mg Oral Q12H  . sertraline  200 mg Oral Daily  . triamterene-hydrochlorothiazide  1 tablet Oral Daily    ROS: History obtained from the patient  General ROS: recent lethargy Psychological ROS: negative for - behavioral disorder, hallucinations, memory difficulties, mood swings or suicidal ideation Ophthalmic ROS: negative for - blurry vision, double vision, eye pain or loss of vision ENT ROS: negative for - epistaxis, nasal discharge, oral lesions, sore throat, tinnitus or vertigo Allergy and Immunology ROS: negative for - hives or itchy/watery eyes Hematological and Lymphatic ROS: negative for - bleeding problems, bruising or swollen lymph nodes Endocrine ROS: negative for - galactorrhea, hair pattern changes, polydipsia/polyuria or temperature intolerance Respiratory ROS: negative for - cough, hemoptysis, shortness of breath or wheezing Cardiovascular ROS:  negative for - chest pain, dyspnea on exertion, edema or irregular heartbeat Gastrointestinal ROS: negative for - abdominal pain, diarrhea, hematemesis, nausea/vomiting or stool incontinence Genito-Urinary ROS: negative for - dysuria, hematuria, incontinence or urinary frequency/urgency Musculoskeletal ROS: negative for - joint swelling or muscular weakness Neurological ROS: as noted in HPI Dermatological ROS: negative for rash and skin lesion changes  Physical Examination: Blood pressure 119/60, pulse 58, temperature 97.5 F (36.4 C), temperature source Oral, resp. rate 15, height 5\' 3"  (1.6 m), weight 74.844 kg (165 lb), SpO2 94.00%.  Neurologic Examination: Mental Status: Lethargic but arousable, oriented.  Speech fluent without evidence of aphasia.  Able to follow 3 step commands without difficulty. Cranial Nerves: II: Discs flat bilaterally; Visual fields grossly normal, pupils equal, round, reactive to light and accommodation III,IV, VI: mild right ptosis, extra-ocular motions intact bilaterally V,VII: right facial droop, facial light touch sensation normal bilaterally VIII: hearing normal bilaterally IX,X: gag reflex present XI: bilateral shoulder shrug XII: midline tongue extension Motor: Right : Upper extremity   5-/5    Left:     Upper extremity   5/5  Lower extremity   5-/5     Lower extremity   5/5 Tone and bulk:normal tone throughout; no atrophy noted Sensory: Pinprick and light touch intact throughout, bilaterally Deep Tendon Reflexes: 3+ on the right and 2+ on the left Plantars: Right: upgoing   Left: upgoing Cerebellar: normal finger-to-nose and normal heel-to-shin test Gait: Unable to test CV: pulses palpable throughout     Laboratory Studies:  Basic Metabolic Panel:  Recent Labs Lab 08/15/13 2226  NA 140  K 3.8  CL 98  CO2 27  GLUCOSE 76  BUN 11  CREATININE 0.64  CALCIUM 9.3    Liver Function Tests: No results found for this basename: AST, ALT,  ALKPHOS, BILITOT, PROT, ALBUMIN,  in the last 168 hours No results found for this basename: LIPASE, AMYLASE,  in the last 168 hours No results found for this basename: AMMONIA,  in the last 168 hours  CBC:  Recent Labs Lab 08/15/13 2226  WBC 5.8  NEUTROABS 3.3  HGB 12.2  HCT 35.5*  MCV 89.0  PLT 162    Cardiac Enzymes: No results found for this basename: CKTOTAL, CKMB, CKMBINDEX, TROPONINI,  in the last 168 hours  BNP: No components found with this basename: POCBNP,   CBG: No results found for this basename: GLUCAP,  in the last 168 hours  Microbiology: Results for orders placed during the hospital encounter of 04/20/11  MRSA PCR SCREENING     Status: None   Collection Time    04/20/11  8:00 PM  Result Value Ref Range Status   MRSA by PCR NEGATIVE  NEGATIVE Final   Comment:            The GeneXpert MRSA Assay (FDA     approved for NASAL specimens     only), is one component of a     comprehensive MRSA colonization     surveillance program. It is not     intended to diagnose MRSA     infection nor to guide or     monitor treatment for     MRSA infections.    Coagulation Studies: No results found for this basename: LABPROT, INR,  in the last 72 hours  Urinalysis:  Recent Labs Lab 08/15/13 2235  COLORURINE YELLOW  LABSPEC 1.019  PHURINE 6.0  GLUCOSEU NEGATIVE  HGBUR NEGATIVE  BILIRUBINUR NEGATIVE  KETONESUR NEGATIVE  PROTEINUR NEGATIVE  UROBILINOGEN 0.2  NITRITE NEGATIVE  LEUKOCYTESUR LARGE*    Lipid Panel:    Component Value Date/Time   CHOL 152 05/19/2012 1235   TRIG 95 05/19/2012 1235   HDL 50 05/19/2012 1235   CHOLHDL 3.0 05/19/2012 1235   VLDL 19 05/19/2012 1235   LDLCALC 83 05/19/2012 1235    HgbA1C:  Lab Results  Component Value Date   HGBA1C 5.7* 11/17/2011    Urine Drug Screen:     Component Value Date/Time   LABOPIA NONE DETECTED 08/15/2013 2235   COCAINSCRNUR NONE DETECTED 08/15/2013 2235   LABBENZ POSITIVE* 08/15/2013 2235    AMPHETMU NONE DETECTED 08/15/2013 2235   THCU NONE DETECTED 08/15/2013 2235   LABBARB NONE DETECTED 08/15/2013 2235    Alcohol Level: No results found for this basename: ETH,  in the last 168 hours   Imaging: Ct Head Wo Contrast  08/16/2013   CLINICAL DATA:  Headache and vertigo; hypertension  EXAM: CT HEAD WITHOUT CONTRAST  TECHNIQUE: Contiguous axial images were obtained from the base of the skull through the vertex without intravenous contrast.  COMPARISON:  Head CT October 29, 2012 and brain MRI November 22, 2012  FINDINGS: The ventricles are normal in size and configuration. There is a stable arachnoid cyst in the lateral right lentiform nucleus measuring 6 x 5 mm. No other mass is seen. There is no hemorrhage, extra-axial fluid collection, or midline shift. Mild patchy small vessel disease in the centra semiovale bilaterally is stable. There is no new gray-white compartment lesion. No demonstrable acute infarct. Bony calvarium appears intact. There is inferior right mastoid opacification, stable. Elsewhere mastoids are clear bilaterally.  IMPRESSION: Mild periventricular small vessel disease. Small arachnoid cyst in the lateral lentiform nucleus on the right, a benign finding which is stable. 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 No intracranial mass, hemorrhage, or acute appearing infarct.   Electronically Signed   By: Lowella Grip M.D.   On: 08/16/2013 00:51   Dg Chest Port 1 View  08/15/2013   CLINICAL DATA:  Altered mental status.  EXAM: PORTABLE CHEST - 1 VIEW  COMPARISON:  10/22/2012  FINDINGS: No cardiomegaly. There is mild aortic tortuosity. Mild diaphragmatic flattening which could reflect COPD (noted smoking history). There is no edema, consolidation, effusion, or pneumothorax.  IMPRESSION: 1.  No active disease. 2. Possible COPD.   Electronically Signed   By: Jorje Guild M.D.   On: 08/15/2013 23:25    Assessment: 58 y.o. female who is a poor historian but seems to have  had the onset of vertigo on yesterday but has been having complaints for the past 1-3  weeks.  Head CT reviewed and shows no acute changes.  Patient has had a history of infarcts and can not rule out the possibility of a posterior circulation event.  Patient with a history of seizures as well which may explain mental status.    Stroke Risk Factors - hypertension and smoking  Plan: 1. EEG 2. MRI of the brain without contrast.  Would not initiate stroke work up unless imaging is diagnostic of an acute infarct.   3. Continue ASA 4. Telemetry monitoring 5. Frequent neuro checks 6. Smoking cessation counseling.    Alexis Goodell, MD [facility] [phone] 08/16/2013, 5:20 AM

## 2013-08-16 NOTE — ED Notes (Signed)
No pain reported/  The  Pt is more alert talking to her daughter

## 2013-08-16 NOTE — ED Notes (Signed)
Report called to rn on 3000 for rm 9

## 2013-08-16 NOTE — Plan of Care (Signed)
Problem: Acute Treatment Outcomes Goal: BP within ordered parameters Outcome: Progressing BP low, MD aware, changes made to antihypertensive

## 2013-08-16 NOTE — H&P (Addendum)
[facility] Admission History and Physical       Anna Lyons OZD:[id number] DOB: 02-Feb-1955 DOA: 08/15/2013  Referring physician: EDP PCP: Leonard Downing, MD  Specialists:   Chief Complaint: Headache and Dizziness  HPI: Anna Lyons is a 58 y.o. female with a history of previous CVAs, and TIAs, CAD, HTN, DM2, and Hyperlipidemia who present to the ED with complaints of sudden onset of Headache and  Dizziness at 9 pm. She states that she felt "drunk" and she couldn't get up and walk because things were spinning around. EMS was called, and when EMS arrived she was sitting on her porch.with her head down and she was unresponsive to them. She was breathing, her glucose was checked and was normal, and she did not seem postictal.  Of note she has had increased slurring of her speech for the past 2 days per her daughter, something that comes and goes.  She was evaluated in the ED and referred for a CVA versus TIA workup.     Review of Systems:  Constitutional: No Weight Loss, No Weight Gain, Night Sweats, Fevers, Chills, +Dizziness, Fatigue, or Generalized Weakness HEENT: No +Headaches, Difficulty Swallowing,Tooth/Dental Problems,Sore Throat,  No Sneezing, Rhinitis, Ear Ache, Nasal Congestion, or Post Nasal Drip,  Cardio-vascular:  No Chest pain, Orthopnea, PND, Edema in Lower Extremities, Anasarca, Dizziness, Palpitations  Resp: No Dyspnea, No DOE, No Cough, No Hemoptysis, No Wheezing.    GI: No Heartburn, Indigestion, Abdominal Pain, Nausea, Vomiting, Diarrhea, Hematemesis, Hematochezia, Melena, Change in Bowel Habits,  Loss of Appetite  GU: No Dysuria, Change in Color of Urine, No Urgency or Frequency, No Flank pain.  Musculoskeletal: No Joint Pain or Swelling, No Decreased Range of Motion, No Back Pain.  Neurologic: No Syncope, No Seizures, Muscle Weakness, Paresthesia, Vision Disturbance or Loss, No Diplopia, +Vertigo, +Difficulty Walking ( Chronic),  Skin: No Rash or  Lesions. Psych: No Change in Mood or Affect, No Depression or Anxiety, No Memory loss, No Confusion, or Hallucinations   Past Medical History  Diagnosis Date  . Depression   . Hypertension   . Angina   . Restless leg syndrome   . TIA (transient ischemic attack) 08/2010  . Stroke 04/20/2011    a. 04/20/11 Left subcortical infarct treated w/ TPA;  b. 04/2011 Echo: EF 55-60%;  c. 04/2011 Normal Carotid u/s  d. Residual "walk w/limp on right; unable to grasp w/right hand"  . Tobacco abuse     a. quit @ time of CVA 04/2011.  [name] RLS (restless legs syndrome) 11/16/2011  . Seizures     Past Surgical History  Procedure Laterality Date  . Mastectomy  1980's    bilateral with reconstruction  . Breast reconstruction      bilaterally  . Cesarean section  1979  . Knee arthroscopy  1990's    left; cartilage repair  . Carpal tunnel release  1990's    left     Prior to Admission medications   Medication Sig Start Date End Date Taking? Authorizing Provider  aspirin EC 325 MG tablet Take 325 mg by mouth daily.   Yes Historical Provider, MD  baclofen (LIORESAL) 20 MG tablet Take 0.5 tablets (10 mg total) by mouth 3 (three) times daily. 04/04/13  Yes Philmore Pali, NP  clonazePAM (KLONOPIN) 1 MG tablet Take 1 mg by mouth at bedtime.   Yes Historical Provider, MD  hydrOXYzine (VISTARIL) 25 MG capsule Take 25 mg by mouth daily as needed for anxiety.   Yes  Historical Provider, MD  levETIRAcetam (KEPPRA) 500 MG tablet Take 500 mg by mouth every 12 (twelve) hours.   Yes Historical Provider, MD  sertraline (ZOLOFT) 100 MG tablet Take 200 mg by mouth daily.    Yes Historical Provider, MD  triamterene-hydrochlorothiazide (MAXZIDE) 75-50 MG per tablet Take 0.5 tablets by mouth daily.   Yes Historical Provider, MD  atorvastatin (LIPITOR) 80 MG tablet Take 80 mg by mouth daily.    Historical Provider, MD      Allergies  Allergen Reactions  . Codeine Itching     Social History:  reports that she has been  smoking Cigarettes.  She has a 18.5 pack-year smoking history. She has never used smokeless tobacco. She reports that she drinks about 8.4 ounces of alcohol per week. She reports that she does not use illicit drugs.     Family History  Problem Relation Age of Onset  . Lupus Mother     died early 37's.  . Emphysema Father     died late 79's.  [name] COPD Father        Physical Exam:  GEN:  Pleasant  58 y.o. female  examined  and in no acute distress; cooperative with exam Filed Vitals:   08/15/13 2302 08/15/13 2328 08/15/13 2336 08/16/13 0152  BP: 109/69 110/63  105/61  Pulse: 63   62  Temp:   98.9 F (37.2 C)   TempSrc:      Resp: 18   18  Height: 5\' 3"  (1.6 m)     Weight: 74.844 kg (165 lb)     SpO2: 93% 96%  99%   Blood pressure 105/61, pulse 62, temperature 98.9 F (37.2 C), temperature source Rectal, resp. rate 18, height 5\' 3"  (1.6 m), weight 74.844 kg (165 lb), SpO2 99.00%. PSYCH: She is alert and oriented x4; does not appear anxious does not appear depressed; affect is normal HEENT: Normocephalic and Atraumatic, Mucous membranes pink; PERRLA; EOM intact; Fundi:  Benign;  No scleral icterus, Nares: Patent, Oropharynx: Clear, Fair Dentition, Neck:  FROM, No Cervical Lymphadenopathy nor Thyromegaly or Carotid Bruit; No JVD; Breasts:: Not examined CHEST WALL: No tenderness CHEST: Normal respiration, clear to auscultation bilaterally HEART: Regular rate and rhythm; no murmurs rubs or gallops BACK: No kyphosis or scoliosis; No CVA tenderness ABDOMEN: Positive Bowel Sounds, Obese, Soft Non-Tender; No Masses, No Organomegaly. Rectal Exam: Not done EXTREMITIES: No Cyanosis, Clubbing, or Edema; No Ulcerations. Genitalia: not examined PULSES: 2+ and symmetric SKIN: Normal hydration no rash or ulceration CNS:  Mental Status:  Alert, Oriented, Thought Content Appropriate. Speech Fluent without evidence of Aphasia. Able to follow 3 step commands without difficulty.  In No obvious pain.    Cranial Nerves:  II: Discs flat bilaterally; Visual fields Intact, Pupils equal and reactive.    III,IV, VI: Extra-ocular motions intact bilaterally    V,VII: smile symmetric, facial light touch sensation normal bilaterally    VIII: hearing decreasesd bilaterally    IX,X: gag reflex present    XI: bilateral shoulder shrug    XII: midline tongue extension   Motor:  Right:  Upper extremity 3/5     Left:  Upper extremity 5/5     Right:  Lower extremity 4/5    Left:  Lower extremity 5/5     Tone and Bulk:  normal tone throughout; no atrophy noted  Sensory:  Pinprick and light touch intact throughout, bilaterally   Deep Tendon Reflexes: 2+ and symmetric throughout   Plantars/ Babinski:  Right:  equivocal     Left: equivocal     Cerebellar:  Finger to nose with difficulty.   Gait: deferred   Vascular: pulses palpable throughout    Labs on Admission:  Basic Metabolic Panel:  Recent Labs Lab 08/15/13 2226  NA 140  K 3.8  CL 98  CO2 27  GLUCOSE 76  BUN 11  CREATININE 0.64  CALCIUM 9.3   Liver Function Tests: No results found for this basename: AST, ALT, ALKPHOS, BILITOT, PROT, ALBUMIN,  in the last 168 hours No results found for this basename: LIPASE, AMYLASE,  in the last 168 hours No results found for this basename: AMMONIA,  in the last 168 hours CBC:  Recent Labs Lab 08/15/13 2226  WBC 5.8  NEUTROABS 3.3  HGB 12.2  HCT 35.5*  MCV 89.0  PLT 162   Cardiac Enzymes: No results found for this basename: CKTOTAL, CKMB, CKMBINDEX, TROPONINI,  in the last 168 hours  BNP (last 3 results) No results found for this basename: PROBNP,  in the last 8760 hours CBG: No results found for this basename: GLUCAP,  in the last 168 hours  Radiological Exams on Admission: Ct Head Wo Contrast  08/16/2013   CLINICAL DATA:  Headache and vertigo; hypertension  EXAM: CT HEAD WITHOUT CONTRAST  TECHNIQUE: Contiguous axial images were obtained from the base of the skull through the  vertex without intravenous contrast.  COMPARISON:  Head CT October 29, 2012 and brain MRI November 22, 2012  FINDINGS: The ventricles are normal in size and configuration. There is a stable arachnoid cyst in the lateral right lentiform nucleus measuring 6 x 5 mm. No other mass is seen. There is no hemorrhage, extra-axial fluid collection, or midline shift. Mild patchy small vessel disease in the centra semiovale bilaterally is stable. There is no new gray-white compartment lesion. No demonstrable acute infarct. Bony calvarium appears intact. There is inferior right mastoid opacification, stable. Elsewhere mastoids are clear bilaterally.  IMPRESSION: Mild periventricular small vessel disease. Small arachnoid cyst in the lateral lentiform nucleus on the right, a benign finding which is stable. 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 No intracranial mass, hemorrhage, or acute appearing infarct.   Electronically Signed   By: Lowella Grip M.D.   On: 08/16/2013 00:51   Dg Chest Port 1 View  08/15/2013   CLINICAL DATA:  Altered mental status.  EXAM: PORTABLE CHEST - 1 VIEW  COMPARISON:  10/22/2012  FINDINGS: No cardiomegaly. There is mild aortic tortuosity. Mild diaphragmatic flattening which could reflect COPD (noted smoking history). There is no edema, consolidation, effusion, or pneumothorax.  IMPRESSION: 1.  No active disease. 2. Possible COPD.   Electronically Signed   By: Jorje Guild M.D.   On: 08/15/2013 23:25       Assessment/Plan:   58 y.o. female with  Principal Problem: 1.   TIA (transient ischemic attack) Versus CVA:   CT scan of Head Negative for Acute findings.    Admit for TIA versus CVA workup,  MRI, 2D ECHO and Carotid US in AM,    Check HbA1C, and Fasting Lipids, and Neuro checks.      Active Problems: 2.   Dizziness:    MRI to rule out posterior circulation pathology.     3.   Essential hypertension, benign:    Continue Triamterene Rx, monitor BPs.     4.    Tobacco abuse:    Needs Counselling for Cessation options.       5.  Seizure:    Stable on Keppra Rx.        6.    DVT Prophylaxis:    With Lovenox.       Code Status:      FULL CODE Family Communication:    Daughter at Bedside Disposition Plan:    Inpatient   Time spent:  [facility] Pager [phone]   If [place] Please Contact the Day Rounding Team MD for [facility]  If 7PM-7AM, Please Contact night-coverage  [email or web address] Password [email or web address] 08/16/2013, 3:00 AM

## 2013-08-17 ENCOUNTER — Observation Stay (HOSPITAL_COMMUNITY): Payer: Medicaid Other

## 2013-08-17 DIAGNOSIS — I6350 Cerebral infarction due to unspecified occlusion or stenosis of unspecified cerebral artery: Secondary | ICD-10-CM

## 2013-08-17 DIAGNOSIS — I6359 Cerebral infarction due to unspecified occlusion or stenosis of other cerebral artery: Secondary | ICD-10-CM

## 2013-08-17 DIAGNOSIS — I6329 Cerebral infarction due to unspecified occlusion or stenosis of other precerebral arteries: Secondary | ICD-10-CM

## 2013-08-17 DIAGNOSIS — I517 Cardiomegaly: Secondary | ICD-10-CM

## 2013-08-17 LAB — URINE CULTURE

## 2013-08-17 MED ORDER — IOHEXOL 350 MG/ML SOLN
80.0000 mL | Freq: Once | INTRAVENOUS | Status: AC | PRN
  Administered 2013-08-17: 60 mL via INTRAVENOUS

## 2013-08-17 NOTE — Discharge Summary (Signed)
Physician Discharge Summary  Anna Lyons:[id number] DOB: [birth year] DOA: 08/15/2013  PCP: Leonard Downing, MD  Admit date: 08/15/2013 Discharge date: 08/17/2013  Time spent: 50 minutes  Recommendations for Outpatient Follow-up:  1. *Follow up PCP in 2 weeks  Discharge Diagnoses:  Principal Problem:   TIA (transient ischemic attack) Active Problems:   Essential hypertension, benign   Tobacco abuse   Seizure   Dizziness   Discharge Condition: Stable  Diet recommendation: *Low salt diet  Filed Weights   08/15/13 2302  Weight: 74.844 kg (165 lb)    History of present illness:  58 y.o. female with a history of previous CVAs, and TIAs, CAD, HTN, DM2, and Hyperlipidemia who present to the ED with complaints of sudden onset of Headache and Dizziness at 9 pm. She states that she felt "drunk" and she couldn't get up and walk because things were spinning around. EMS was called, and when EMS arrived she was sitting on her porch.with her head down and she was unresponsive to them. She was breathing, her glucose was checked and was normal, and she did not seem postictal. Of note she has had increased slurring of her speech for the past 2 days per her daughter, something that comes and goes. She was evaluated in the ED and referred for a CVA versus TIA workup.    Hospital Course:  Dizziness- patient was admitted for possible stroke work up. CT head MRI Brain were negative for stroke, dizziness resolved.  CTA chest was done which was negative for PE. Carotid dopplers were normal, Bilateral: 1-39% ICA stenosis. Vertebral artery flow is antegrade Echo showed grade 1 diastolic dysfunction  Hypertension Orthostatics were negative Continue the home medications.  Procedures:  *echo  cardotid duplex  Consultations:  None  Discharge Exam: Filed Vitals:   08/17/13 0914  BP: 123/64  Pulse: 62  Temp: 98.2 F (36.8 C)  Resp:     General: Appear in no acute  distress Cardiovascular: *S1s2 RRR Respiratory: *Clear bilaterally  Discharge Instructions You were cared for by a hospitalist during your hospital stay. If you have any questions about your discharge medications or the care you received while you were in the hospital after you are discharged, you can call the unit and asked to speak with the hospitalist on call if the hospitalist that took care of you is not available. Once you are discharged, your primary care physician will handle any further medical issues. Please note that NO REFILLS for any discharge medications will be authorized once you are discharged, as it is imperative that you return to your primary care physician (or establish a relationship with a primary care physician if you do not have one) for your aftercare needs so that they can reassess your need for medications and monitor your lab values.  Discharge Instructions   Diet - low sodium heart healthy    Complete by:  As directed      Diet - low sodium heart healthy    Complete by:  As directed      Increase activity slowly    Complete by:  As directed      Increase activity slowly    Complete by:  As directed             Medication List         aspirin EC 325 MG tablet  Take 325 mg by mouth daily.     atorvastatin 80 MG tablet  Commonly known as:  LIPITOR  Take 80 mg by mouth daily.     baclofen 20 MG tablet  Commonly known as:  LIORESAL  Take 0.5 tablets (10 mg total) by mouth 3 (three) times daily.     clonazePAM 1 MG tablet  Commonly known as:  KLONOPIN  Take 1 mg by mouth at bedtime.     hydrOXYzine 25 MG capsule  Commonly known as:  VISTARIL  Take 25 mg by mouth daily as needed for anxiety.     levETIRAcetam 500 MG tablet  Commonly known as:  KEPPRA  Take 500 mg by mouth every 12 (twelve) hours.     sertraline 100 MG tablet  Commonly known as:  ZOLOFT  Take 200 mg by mouth daily.     triamterene-hydrochlorothiazide 75-50 MG per tablet   Commonly known as:  MAXZIDE  Take 0.5 tablets by mouth daily.       Allergies  Allergen Reactions  . Codeine Itching      The results of significant diagnostics from this hospitalization (including imaging, microbiology, ancillary and laboratory) are listed below for reference.    Significant Diagnostic Studies: Ct Head Wo Contrast  08/16/2013   CLINICAL DATA:  Headache and vertigo; hypertension  EXAM: CT HEAD WITHOUT CONTRAST  TECHNIQUE: Contiguous axial images were obtained from the base of the skull through the vertex without intravenous contrast.  COMPARISON:  Head CT October 29, 2012 and brain MRI November 22, 2012  FINDINGS: The ventricles are normal in size and configuration. There is a stable arachnoid cyst in the lateral right lentiform nucleus measuring 6 x 5 mm. No other mass is seen. There is no hemorrhage, extra-axial fluid collection, or midline shift. Mild patchy small vessel disease in the centra semiovale bilaterally is stable. There is no new gray-white compartment lesion. No demonstrable acute infarct. Bony calvarium appears intact. There is inferior right mastoid opacification, stable. Elsewhere mastoids are clear bilaterally.  IMPRESSION: Mild periventricular small vessel disease. Small arachnoid cyst in the lateral lentiform nucleus on the right, a benign finding which is stable. 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 No intracranial mass, hemorrhage, or acute appearing infarct.   Electronically Signed   By: Lowella Grip M.D.   On: 08/16/2013 00:51   Ct Angio Chest Pe W/cm &/or Wo Cm  08/17/2013   CLINICAL DATA:  ? PE, dizziness, negative MRI  EXAM: CT ANGIOGRAPHY CHEST WITH CONTRAST  TECHNIQUE: Multidetector CT imaging of the chest was performed using the standard protocol during bolus administration of intravenous contrast. Multiplanar CT image reconstructions and MIPs were obtained to evaluate the vascular anatomy.  CONTRAST:  35mL OMNIPAQUE IOHEXOL 350  MG/ML SOLN  COMPARISON:  04/17/2008  FINDINGS: Satisfactory opacification of pulmonary arteries noted, and there is no evidence of pulmonary emboli. Early contrast opacification of the thoracic aorta with no evidence of aneurysm. Patchy aortic and coronary calcified plaque. No pleural or pericardial effusion. No hilar or mediastinal adenopathy. Linear scarring or subsegmental atelectasis in the posterior basal segment right lower lobe. Stable 3 mm subpleural nodules in the anterior right upper lobe and right middle lobe images 40 and 50 series -6. Lungs otherwise clear. Thoracic spine and sternum intact. Visualized portions of upper abdomen unremarkable.  Review of the MIP images confirms the above findings.  IMPRESSION: 1. Negative for acute PE or thoracic aortic dissection. 2. Atherosclerosis, including aortic and coronary artery disease. Please note that although the presence of coronary artery calcium documents the presence of coronary artery disease, the severity of this  disease and any potential stenosis cannot be assessed on this non-gated CT examination. Assessment for potential risk factor modification, dietary therapy or pharmacologic therapy may be warranted, if clinically indicated.   Electronically Signed   By: Arne Cleveland M.D.   On: 08/17/2013 14:46   Mri Brain Without Contrast  08/16/2013   CLINICAL DATA:  Onset of vertigo beginning 08/15/2013. Possible posterior circulation event. History of seizures. Stroke risk factors include hypertension and smoking.  EXAM: MRI HEAD WITHOUT CONTRAST  MRA HEAD WITHOUT CONTRAST  TECHNIQUE: Multiplanar, multiecho pulse sequences of the brain and surrounding structures were obtained without intravenous contrast. Angiographic images of the head were obtained using MRA technique without contrast.  COMPARISON:  CT head 08/15/2013.  MR head 11/22/2012.  FINDINGS: MRI HEAD FINDINGS  No evidence for acute infarction, hemorrhage, mass lesion, hydrocephalus, or  extra-axial fluid. Premature for age cerebral and cerebellar atrophy. Mild subcortical and periventricular T2 and FLAIR hyperintensities, likely chronic microvascular ischemic change. BILATERAL lacunar infarcts affect the basal ganglia. Chronic microvascular ischemic change also involves the pons and midbrain. No chronic or acute cerebellar infarcts. Flow voids are maintained throughout the carotid, basilar, and vertebral arteries. There are no areas of chronic hemorrhage except for the previously demonstrated hemorrhagic infarct of the LEFT putamen. Pituitary, pineal, and cerebellar tonsils unremarkable. No upper cervical lesions. Negative orbits. No sinus or mastoid disease. Similar appearance to prior CT and prior MRI.  MRA HEAD FINDINGS  Dolichoectatic but widely patent internal carotid artery on the RIGHT. There is a focal web in the proximal petrous ICA on the LEFT, not clearly flow reducing given the normal size and signal intensity of the more cephalad LEFT ICA. No cavernous or supraclinoid disease. No basilar stenosis. RIGHT vertebral dominant. Fenestrated LEFT vertebral artery without focal stenosis.  There is no proximal stenosis of the anterior, middle, or posterior cerebral arteries. There is no cerebellar branch occlusion. There is no intracranial aneurysm.  IMPRESSION: Chronic changes as described. No acute intracranial abnormality. Specifically, no evidence for acute posterior circulation event.  No focal flow reducing lesion on intracranial MRA.   Electronically Signed   By: Rolla Flatten M.D.   On: 08/16/2013 09:52   Dg Chest Port 1 View  08/15/2013   CLINICAL DATA:  Altered mental status.  EXAM: PORTABLE CHEST - 1 VIEW  COMPARISON:  10/22/2012  FINDINGS: No cardiomegaly. There is mild aortic tortuosity. Mild diaphragmatic flattening which could reflect COPD (noted smoking history). There is no edema, consolidation, effusion, or pneumothorax.  IMPRESSION: 1.  No active disease. 2. Possible COPD.    Electronically Signed   By: Jorje Guild M.D.   On: 08/15/2013 23:25   Mr Jodene Nam Head/brain Wo Cm  08/16/2013   CLINICAL DATA:  Onset of vertigo beginning 08/15/2013. Possible posterior circulation event. History of seizures. Stroke risk factors include hypertension and smoking.  EXAM: MRI HEAD WITHOUT CONTRAST  MRA HEAD WITHOUT CONTRAST  TECHNIQUE: Multiplanar, multiecho pulse sequences of the brain and surrounding structures were obtained without intravenous contrast. Angiographic images of the head were obtained using MRA technique without contrast.  COMPARISON:  CT head 08/15/2013.  MR head 11/22/2012.  FINDINGS: MRI HEAD FINDINGS  No evidence for acute infarction, hemorrhage, mass lesion, hydrocephalus, or extra-axial fluid. Premature for age cerebral and cerebellar atrophy. Mild subcortical and periventricular T2 and FLAIR hyperintensities, likely chronic microvascular ischemic change. BILATERAL lacunar infarcts affect the basal ganglia. Chronic microvascular ischemic change also involves the pons and midbrain. No chronic or acute cerebellar  infarcts. Flow voids are maintained throughout the carotid, basilar, and vertebral arteries. There are no areas of chronic hemorrhage except for the previously demonstrated hemorrhagic infarct of the LEFT putamen. Pituitary, pineal, and cerebellar tonsils unremarkable. No upper cervical lesions. Negative orbits. No sinus or mastoid disease. Similar appearance to prior CT and prior MRI.  MRA HEAD FINDINGS  Dolichoectatic but widely patent internal carotid artery on the RIGHT. There is a focal web in the proximal petrous ICA on the LEFT, not clearly flow reducing given the normal size and signal intensity of the more cephalad LEFT ICA. No cavernous or supraclinoid disease. No basilar stenosis. RIGHT vertebral dominant. Fenestrated LEFT vertebral artery without focal stenosis.  There is no proximal stenosis of the anterior, middle, or posterior cerebral arteries. There is  no cerebellar branch occlusion. There is no intracranial aneurysm.  IMPRESSION: Chronic changes as described. No acute intracranial abnormality. Specifically, no evidence for acute posterior circulation event.  No focal flow reducing lesion on intracranial MRA.   Electronically Signed   By: Rolla Flatten M.D.   On: 08/16/2013 09:52    Microbiology: Recent Results (from the past 240 hour(s))  URINE CULTURE     Status: None   Collection Time    08/15/13 10:35 PM      Result Value Ref Range Status   Specimen Description URINE, CATHETERIZED   Final   Special Requests NONE   Final   Culture  Setup Time     Final   Value: 08/16/2013 04:09     Performed at [facility]     Final   Value: 30,000 COLONIES/ML     Performed at [facility]   Culture     Final   Value: Multiple bacterial morphotypes present, none predominant. Suggest appropriate recollection if clinically indicated.     Performed at [facility]   Report Status 08/17/2013 FINAL   Final     Labs: Basic Metabolic Panel:  Recent Labs Lab 08/15/13 2226  NA 140  K 3.8  CL 98  CO2 27  GLUCOSE 76  BUN 11  CREATININE 0.64  CALCIUM 9.3   Liver Function Tests: No results found for this basename: AST, ALT, ALKPHOS, BILITOT, PROT, ALBUMIN,  in the last 168 hours No results found for this basename: LIPASE, AMYLASE,  in the last 168 hours No results found for this basename: AMMONIA,  in the last 168 hours CBC:  Recent Labs Lab 08/15/13 2226  WBC 5.8  NEUTROABS 3.3  HGB 12.2  HCT 35.5*  MCV 89.0  PLT 162   Cardiac Enzymes: No results found for this basename: CKTOTAL, CKMB, CKMBINDEX, TROPONINI,  in the last 168 hours BNP: BNP (last 3 results) No results found for this basename: PROBNP,  in the last 8760 hours CBG:  Recent Labs Lab 08/16/13 1005  GLUCAP 95       Signed:  Rozella Servello S  [facility] 08/17/2013, 4:10 PM

## 2013-08-17 NOTE — Discharge Instructions (Signed)
STROKE/TIA DISCHARGE INSTRUCTIONS SMOKING Cigarette smoking nearly doubles your risk of having a stroke & is the single most alterable risk factor  If you smoke or have smoked in the last 12 months, you are advised to quit smoking for your health.  Most of the excess cardiovascular risk related to smoking disappears within a year of stopping.  Ask you doctor about anti-smoking medications  [place] Quit Line: 1-800-QUIT NOW  Free Smoking Cessation Classes [phone]  CHOLESTEROL Know your levels; limit fat & cholesterol in your diet  Lipid Panel     Component Value Date/Time   CHOL 94 08/16/2013 0625   TRIG 81 08/16/2013 0625   HDL 38* 08/16/2013 0625   CHOLHDL 2.5 08/16/2013 0625   VLDL 16 08/16/2013 0625   LDLCALC 40 08/16/2013 0625      Many patients benefit from treatment even if their cholesterol is at goal.  Goal: Total Cholesterol (CHOL) less than 160  Goal:  Triglycerides (TRIG) less than 150  Goal:  HDL greater than 40  Goal:  LDL (LDLCALC) less than 100   BLOOD PRESSURE American Stroke Association blood pressure target is less that 120/80 mm/Hg  Your discharge blood pressure is:  BP: 123/64 mmHg  Monitor your blood pressure  Limit your salt and alcohol intake  Many individuals will require more than one medication for high blood pressure  DIABETES (A1c is a blood sugar average for last 3 months) Goal HGBA1c is under 7% (HBGA1c is blood sugar average for last 3 months)  Diabetes: No known diagnosis of diabetes    Lab Results  Component Value Date   HGBA1C 5.8* 08/16/2013     Your HGBA1c can be lowered with medications, healthy diet, and exercise.  Check your blood sugar as directed by your physician  Call your physician if you experience unexplained or low blood sugars.  PHYSICAL ACTIVITY/REHABILITATION Goal is 30 minutes at least 4 days per week  Activity: No restrictions. Therapies: None Return to work: No restrictions  Activity decreases your risk of heart  attack and stroke and makes your heart stronger.  It helps control your weight and blood pressure; helps you relax and can improve your mood.  Participate in a regular exercise program.  Talk with your doctor about the best form of exercise for you (dancing, walking, swimming, cycling).  DIET/WEIGHT Goal is to maintain a healthy weight  Your discharge diet is: Cardiac Heart healthy Your height is:  Height: 5\' 3"  (160 cm) Your current weight is: Weight: 74.844 kg (165 lb) Your Body Mass Index (BMI) is:  BMI (Calculated): 29.3  Following the type of diet specifically designed for you will help prevent another stroke.  Your goal weight range is:  107-135  Your goal Body Mass Index (BMI) is 19-24.  Healthy food habits can help reduce 3 risk factors for stroke:  High cholesterol, hypertension, and excess weight.  RESOURCES Stroke/Support Group:  Call [phone]   STROKE EDUCATION PROVIDED/REVIEWED AND GIVEN TO PATIENT Stroke warning signs and symptoms How to activate emergency medical system (call 911). Medications prescribed at discharge. Need for follow-up after discharge. Personal risk factors for stroke. Pneumonia vaccine given: No Flu vaccine given: No My questions have been answered, the writing is legible, and I understand these instructions.  I will adhere to these goals & educational materials that have been provided to me after my discharge from the hospital.

## 2013-08-17 NOTE — Progress Notes (Signed)
Patient and daughter given discharge instructions; all questions answered.  Patient's telemetry and IVs were discontinued.  Patient escorted via wheelchair to family's vehicle.  Patient discharged home.

## 2013-08-17 NOTE — Progress Notes (Signed)
Subjective: Patient has no complainants. No longer having dizzy sensation.   Objective: Current vital signs: BP 123/64  Pulse 62  Temp(Src) 98.2 F (36.8 C) (Oral)  Resp 18  Ht 5\' 3"  (1.6 m)  Wt 74.844 kg (165 lb)  BMI 29.24 kg/m2  SpO2 90% Vital signs in last 24 hours: Temp:  [97.9 F (36.6 C)-98.3 F (36.8 C)] 98.2 F (36.8 C) (07/17 0914) Pulse Rate:  [60-64] 62 (07/17 0914) Resp:  [16-18] 18 (07/17 0400) BP: (97-123)/(52-64) 123/64 mmHg (07/17 0914) SpO2:  [90 %-96 %] 90 % (07/17 0914)  Intake/Output from previous day: 07/16 0701 - 07/17 0700 In: -  Out: 1050 [Urine:1050] Intake/Output this shift: Total I/O In: 360 [P.O.:360] Out: -  Nutritional status: Cardiac  Neurologic Exam: Mental Status:  Awake and oriented. Speech fluent without evidence of aphasia. Able to follow 3 step commands without difficulty.  Cranial Nerves:  II: Discs flat bilaterally; Visual fields grossly normal, pupils equal, round, reactive to light and accommodation  III,IV, VI: mild right ptosis, extra-ocular motions intact bilaterally  V,VII: right facial droop (old), facial light touch sensation normal bilaterally  VIII: hearing normal bilaterally  IX,X: gag reflex present  XI: bilateral shoulder shrug  XII: midline tongue extension  Motor:  Right :  Upper extremity 5-/5   Left:  Upper extremity 5/5   Lower extremity 5-/5    Lower extremity 5/5  Tone and bulk:normal tone throughout; no atrophy noted  Sensory: Pinprick and light touch intact throughout, bilaterally  Deep Tendon Reflexes: 3+ on the right and 2+ on the left  Plantars:  Right: upgoing    Left: upgoing  Cerebellar:  normal finger-to-nose and normal heel-to-shin test  Gait: Unable to test  CV: pulses palpable throughout   Physical Exam  Cardiovascular: Regular rhythm.   Respiratory: Breath sounds normal.  GI: Bowel sounds are normal.     Lab Results: Basic Metabolic Panel:  Recent Labs Lab 08/15/13 2226  NA  140  K 3.8  CL 98  CO2 27  GLUCOSE 76  BUN 11  CREATININE 0.64  CALCIUM 9.3    Liver Function Tests: No results found for this basename: AST, ALT, ALKPHOS, BILITOT, PROT, ALBUMIN,  in the last 168 hours No results found for this basename: LIPASE, AMYLASE,  in the last 168 hours No results found for this basename: AMMONIA,  in the last 168 hours  CBC:  Recent Labs Lab 08/15/13 2226  WBC 5.8  NEUTROABS 3.3  HGB 12.2  HCT 35.5*  MCV 89.0  PLT 162    Cardiac Enzymes: No results found for this basename: CKTOTAL, CKMB, CKMBINDEX, TROPONINI,  in the last 168 hours  Lipid Panel:  Recent Labs Lab 08/16/13 0625  CHOL 94  TRIG 81  HDL 38*  CHOLHDL 2.5  VLDL 16  LDLCALC 40    CBG:  Recent Labs Lab 08/16/13 [place]    Microbiology: Results for orders placed during the hospital encounter of 08/15/13  URINE CULTURE     Status: None   Collection Time    08/15/13 10:35 PM      Result Value Ref Range Status   Specimen Description URINE, CATHETERIZED   Final   Special Requests NONE   Final   Culture  Setup Time     Final   Value: 08/16/2013 04:09     Performed at [facility]     Final   Value: 30,000 COLONIES/ML  Performed at [facility]     Final   Value: Multiple bacterial morphotypes present, none predominant. Suggest appropriate recollection if clinically indicated.     Performed at [facility]   Report Status 08/17/2013 FINAL   Final    Coagulation Studies: No results found for this basename: LABPROT, INR,  in the last 72 hours  Imaging: Ct Head Wo Contrast  08/16/2013   CLINICAL DATA:  Headache and vertigo; hypertension  EXAM: CT HEAD WITHOUT CONTRAST  TECHNIQUE: Contiguous axial images were obtained from the base of the skull through the vertex without intravenous contrast.  COMPARISON:  Head CT October 29, 2012 and brain MRI November 22, 2012  FINDINGS: The ventricles are normal in size  and configuration. There is a stable arachnoid cyst in the lateral right lentiform nucleus measuring 6 x 5 mm. No other mass is seen. There is no hemorrhage, extra-axial fluid collection, or midline shift. Mild patchy small vessel disease in the centra semiovale bilaterally is stable. There is no new gray-white compartment lesion. No demonstrable acute infarct. Bony calvarium appears intact. There is inferior right mastoid opacification, stable. Elsewhere mastoids are clear bilaterally.  IMPRESSION: Mild periventricular small vessel disease. Small arachnoid cyst in the lateral lentiform nucleus on the right, a benign finding which is stable. 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 No intracranial mass, hemorrhage, or acute appearing infarct.   Electronically Signed   By: Lowella Grip M.D.   On: 08/16/2013 00:51   Mri Brain Without Contrast  08/16/2013   CLINICAL DATA:  Onset of vertigo beginning 08/15/2013. Possible posterior circulation event. History of seizures. Stroke risk factors include hypertension and smoking.  EXAM: MRI HEAD WITHOUT CONTRAST  MRA HEAD WITHOUT CONTRAST  TECHNIQUE: Multiplanar, multiecho pulse sequences of the brain and surrounding structures were obtained without intravenous contrast. Angiographic images of the head were obtained using MRA technique without contrast.  COMPARISON:  CT head 08/15/2013.  MR head 11/22/2012.  FINDINGS: MRI HEAD FINDINGS  No evidence for acute infarction, hemorrhage, mass lesion, hydrocephalus, or extra-axial fluid. Premature for age cerebral and cerebellar atrophy. Mild subcortical and periventricular T2 and FLAIR hyperintensities, likely chronic microvascular ischemic change. BILATERAL lacunar infarcts affect the basal ganglia. Chronic microvascular ischemic change also involves the pons and midbrain. No chronic or acute cerebellar infarcts. Flow voids are maintained throughout the carotid, basilar, and vertebral arteries. There are no areas of  chronic hemorrhage except for the previously demonstrated hemorrhagic infarct of the LEFT putamen. Pituitary, pineal, and cerebellar tonsils unremarkable. No upper cervical lesions. Negative orbits. No sinus or mastoid disease. Similar appearance to prior CT and prior MRI.  MRA HEAD FINDINGS  Dolichoectatic but widely patent internal carotid artery on the RIGHT. There is a focal web in the proximal petrous ICA on the LEFT, not clearly flow reducing given the normal size and signal intensity of the more cephalad LEFT ICA. No cavernous or supraclinoid disease. No basilar stenosis. RIGHT vertebral dominant. Fenestrated LEFT vertebral artery without focal stenosis.  There is no proximal stenosis of the anterior, middle, or posterior cerebral arteries. There is no cerebellar branch occlusion. There is no intracranial aneurysm.  IMPRESSION: Chronic changes as described. No acute intracranial abnormality. Specifically, no evidence for acute posterior circulation event.  No focal flow reducing lesion on intracranial MRA.   Electronically Signed   By: Rolla Flatten M.D.   On: 08/16/2013 09:52   Dg Chest Port 1 View  08/15/2013   CLINICAL DATA:  Altered mental status.  EXAM: PORTABLE CHEST - 1 VIEW  COMPARISON:  10/22/2012  FINDINGS: No cardiomegaly. There is mild aortic tortuosity. Mild diaphragmatic flattening which could reflect COPD (noted smoking history). There is no edema, consolidation, effusion, or pneumothorax.  IMPRESSION: 1.  No active disease. 2. Possible COPD.   Electronically Signed   By: Jorje Guild M.D.   On: 08/15/2013 23:25   Mr Jodene Nam Head/brain Wo Cm  08/16/2013   CLINICAL DATA:  Onset of vertigo beginning 08/15/2013. Possible posterior circulation event. History of seizures. Stroke risk factors include hypertension and smoking.  EXAM: MRI HEAD WITHOUT CONTRAST  MRA HEAD WITHOUT CONTRAST  TECHNIQUE: Multiplanar, multiecho pulse sequences of the brain and surrounding structures were obtained without  intravenous contrast. Angiographic images of the head were obtained using MRA technique without contrast.  COMPARISON:  CT head 08/15/2013.  MR head 11/22/2012.  FINDINGS: MRI HEAD FINDINGS  No evidence for acute infarction, hemorrhage, mass lesion, hydrocephalus, or extra-axial fluid. Premature for age cerebral and cerebellar atrophy. Mild subcortical and periventricular T2 and FLAIR hyperintensities, likely chronic microvascular ischemic change. BILATERAL lacunar infarcts affect the basal ganglia. Chronic microvascular ischemic change also involves the pons and midbrain. No chronic or acute cerebellar infarcts. Flow voids are maintained throughout the carotid, basilar, and vertebral arteries. There are no areas of chronic hemorrhage except for the previously demonstrated hemorrhagic infarct of the LEFT putamen. Pituitary, pineal, and cerebellar tonsils unremarkable. No upper cervical lesions. Negative orbits. No sinus or mastoid disease. Similar appearance to prior CT and prior MRI.  MRA HEAD FINDINGS  Dolichoectatic but widely patent internal carotid artery on the RIGHT. There is a focal web in the proximal petrous ICA on the LEFT, not clearly flow reducing given the normal size and signal intensity of the more cephalad LEFT ICA. No cavernous or supraclinoid disease. No basilar stenosis. RIGHT vertebral dominant. Fenestrated LEFT vertebral artery without focal stenosis.  There is no proximal stenosis of the anterior, middle, or posterior cerebral arteries. There is no cerebellar branch occlusion. There is no intracranial aneurysm.  IMPRESSION: Chronic changes as described. No acute intracranial abnormality. Specifically, no evidence for acute posterior circulation event.  No focal flow reducing lesion on intracranial MRA.   Electronically Signed   By: Rolla Flatten M.D.   On: 08/16/2013 09:52    Medications:  Scheduled: .  stroke: mapping our early stages of recovery book   Does not apply Once  . aspirin EC   325 mg Oral Daily  . atorvastatin  80 mg Oral Daily  . baclofen  10 mg Oral TID  . enoxaparin (LOVENOX) injection  40 mg Subcutaneous Daily  . levETIRAcetam  500 mg Oral Q12H  . sertraline  200 mg Oral Daily  . triamterene-hydrochlorothiazide  0.5 tablet Oral Daily    Impression: this is a normal awake and asleep EEG. Please, be aware that a normal EEG does not exclude the possibility of epilepsy.   Assessment/Plan: 58 YO female with vertigo which has now fully resolved. MRI brain showed no acute infarct and EEG was negative for epileptiform activity. AS symptoms have resolved and testing is negative would not do further neurological testing. Recommend Continue ASA daily and Cessation of smoking.   No further neurologic intervention is recommended at this time.  If further questions arise, please call or page at that time.  Thank you for allowing neurology to participate in the care of this patient.  Etta Quill PA-C [facility] [phone]  08/17/2013, 10:31  AM   Etta Quill PA-C [facility] [phone]  08/17/2013, 10:15 AM

## 2013-08-17 NOTE — Progress Notes (Signed)
*  PRELIMINARY RESULTS* Vascular Ultrasound Carotid Duplex (Doppler) has been completed.  Preliminary findings: Bilateral:  1-39% ICA stenosis.  Vertebral artery flow is antegrade.      Anna Lyons FRANCES 08/17/2013, 9:42 AM

## 2013-08-17 NOTE — Progress Notes (Signed)
Echo Lab  2D Echocardiogram completed.  [place], RDCS 08/17/2013 9:38 AM

## 2013-08-17 NOTE — Progress Notes (Signed)
Utilization review completed.  

## 2013-08-17 NOTE — Evaluation (Signed)
Physical Therapy Evaluation and D/C Patient Details Name: Anna Lyons MRN: [id number] DOB: 1955-09-20 Today's Date: 08/17/2013   History of Present Illness  Pt admit with TIA with resolution of symptoms.  Clinical Impression  Pt admitted with above. Pt currently without significant functional limitations and can ambulate with Modif I with device safely.  Pt will not need skilled PT.  D/C PT.       Follow Up Recommendations No PT follow up    Equipment Recommendations  Other (comment) (Daughter to borrow RW from church)    Recommendations for Other Services       Precautions / Restrictions Precautions Precautions: Fall Restrictions Weight Bearing Restrictions: No      Mobility  Bed Mobility Overal bed mobility: Independent                Transfers Overall transfer level: Needs assistance Equipment used: Rolling walker (2 wheeled);None Transfers: Sit to/from Stand Sit to Stand: Supervision         General transfer comment: Pt safe with transfers.    Ambulation/Gait Ambulation/Gait assistance: Supervision;Min guard Ambulation Distance (Feet): 350 Feet Assistive device: Rolling walker (2 wheeled);None Gait Pattern/deviations: Decreased stance time - right;Decreased weight shift to right;Decreased dorsiflexion - right   Gait velocity interpretation: Below normal speed for age/gender General Gait Details: Pt with no significant LOB however does have incr weight shift to the right which pt states is her baseline.    Stairs            Wheelchair Mobility    Modified Rankin (Stroke Patients Only) Modified Rankin (Stroke Patients Only) Pre-Morbid Rankin Score: Moderate disability Modified Rankin: Moderate disability     Balance Overall balance assessment: Needs assistance;History of Falls Sitting-balance support: No upper extremity supported;Feet supported Sitting balance-Leahy Scale: Good     Standing balance support: No upper extremity  supported;During functional activity Standing balance-Leahy Scale: Fair Standing balance comment: Pt can stand statically and maintain balance but cannot tolerate challenges to balance without support.                   Standardized Balance Assessment Standardized Balance Assessment : Dynamic Gait Index   Dynamic Gait Index Level Surface: Normal Change in Gait Speed: Mild Impairment Gait with Horizontal Head Turns: Normal Gait with Vertical Head Turns: Normal Gait and Pivot Turn: Normal Step Over Obstacle: Moderate Impairment Step Around Obstacles: Normal Steps: Moderate Impairment Total Score: 19       Pertinent Vitals/Pain VSS, No pain    Home Living Family/patient expects to be discharged to:: Private residence Living Arrangements: Alone Available Help at Discharge: Family;Available PRN/intermittently Type of Home: Apartment Home Access: Level entry     Home Layout: One level Home Equipment: Shower seat      Prior Function Level of Independence: Independent               Hand Dominance   Dominant Hand: Right    Extremity/Trunk Assessment   Upper Extremity Assessment: Defer to OT evaluation           Lower Extremity Assessment: Generalized weakness      Cervical / Trunk Assessment: Normal  Communication   Communication: No difficulties  Cognition Arousal/Alertness: Awake/alert Behavior During Therapy: WFL for tasks assessed/performed Overall Cognitive Status: Within Functional Limits for tasks assessed                      General Comments General comments (skin integrity, edema, etc.): Pt scored  19/24 placing at low risk of falls without device.  ultimately, recommend RW for pt and daughter and pt state they can borrow one from church.  Pt has a cane which this PT explained to pt the benefits of using RW over cane.      Exercises        Assessment/Plan    PT Assessment Patent does not need any further PT services  PT  Diagnosis     PT Problem List    PT Treatment Interventions     PT Goals (Current goals can be found in the Care Plan section) Acute Rehab PT Goals PT Goal Formulation: No goals set, d/c therapy    Frequency     Barriers to discharge        Co-evaluation               End of Session Equipment Utilized During Treatment: Gait belt Activity Tolerance: Patient tolerated treatment well Patient left: in bed;with family/visitor present Nurse Communication: Mobility status    Functional Assessment Tool Used: clinical judgement Functional Limitation: Mobility: Walking and moving around Mobility: Walking and Moving Around Current Status ([id number]): At least 1 percent but less than 20 percent impaired, limited or restricted Mobility: Walking and Moving Around Goal Status [phone]): At least 1 percent but less than 20 percent impaired, limited or restricted Mobility: Walking and Moving Around Discharge Status [phone]): At least 1 percent but less than 20 percent impaired, limited or restricted    Time: 1101-1120 PT Time Calculation (min): 19 min   Charges:   PT Evaluation $Initial PT Evaluation Tier I: 1 Procedure PT Treatments $Gait Training: 8-22 mins   PT G Codes:   Functional Assessment Tool Used: clinical judgement Functional Limitation: Mobility: Walking and moving around    INGOLD,Jennae Hakeem 08/17/2013, 1:50 PM  [facility] Acute Rehabilitation [phone] (pager)

## 2013-08-23 ENCOUNTER — Ambulatory Visit: Payer: 59 | Admitting: Physical Medicine & Rehabilitation

## 2013-09-21 ENCOUNTER — Other Ambulatory Visit: Payer: Self-pay | Admitting: Nurse Practitioner

## 2013-10-09 ENCOUNTER — Ambulatory Visit: Payer: Self-pay | Admitting: Nurse Practitioner

## 2013-10-09 ENCOUNTER — Telehealth: Payer: Self-pay | Admitting: Nurse Practitioner

## 2013-10-09 NOTE — Telephone Encounter (Signed)
Patient was no show for today's office appointment.  

## 2013-10-19 ENCOUNTER — Encounter: Payer: Self-pay | Admitting: Nurse Practitioner

## 2013-10-19 ENCOUNTER — Encounter (INDEPENDENT_AMBULATORY_CARE_PROVIDER_SITE_OTHER): Payer: Self-pay

## 2013-10-19 ENCOUNTER — Ambulatory Visit (INDEPENDENT_AMBULATORY_CARE_PROVIDER_SITE_OTHER): Payer: Medicare Other | Admitting: Nurse Practitioner

## 2013-10-19 VITALS — BP 105/66 | HR 70 | Ht 63.0 in | Wt 150.6 lb

## 2013-10-19 DIAGNOSIS — R5383 Other fatigue: Secondary | ICD-10-CM

## 2013-10-19 DIAGNOSIS — G2581 Restless legs syndrome: Secondary | ICD-10-CM

## 2013-10-19 DIAGNOSIS — Z8673 Personal history of transient ischemic attack (TIA), and cerebral infarction without residual deficits: Secondary | ICD-10-CM

## 2013-10-19 DIAGNOSIS — R0609 Other forms of dyspnea: Secondary | ICD-10-CM

## 2013-10-19 DIAGNOSIS — R0989 Other specified symptoms and signs involving the circulatory and respiratory systems: Secondary | ICD-10-CM

## 2013-10-19 DIAGNOSIS — G8111 Spastic hemiplegia affecting right dominant side: Secondary | ICD-10-CM

## 2013-10-19 DIAGNOSIS — I200 Unstable angina: Secondary | ICD-10-CM

## 2013-10-19 DIAGNOSIS — G4710 Hypersomnia, unspecified: Secondary | ICD-10-CM

## 2013-10-19 DIAGNOSIS — G8110 Spastic hemiplegia affecting unspecified side: Secondary | ICD-10-CM

## 2013-10-19 DIAGNOSIS — R0683 Snoring: Secondary | ICD-10-CM

## 2013-10-19 DIAGNOSIS — G4719 Other hypersomnia: Secondary | ICD-10-CM

## 2013-10-19 DIAGNOSIS — R5381 Other malaise: Secondary | ICD-10-CM

## 2013-10-19 IMAGING — CT CT HEAD W/O CM
1 of 2 series · 13 of 30 positions shown, 17 images · non-contrast
Comparison: 11/16/2011

CLINICAL DATA: Seizure

CT HEAD WITHOUT CONTRAST
TECHNIQUE: Contiguous axial images were obtained from the base of
the skull through the vertex without contrast.

[Series 2: brain · axial · 0.47mm/px · z∈[+115,+254]mm · 13 of 32 slices shown, 17 images]
[im 3/32  brain]
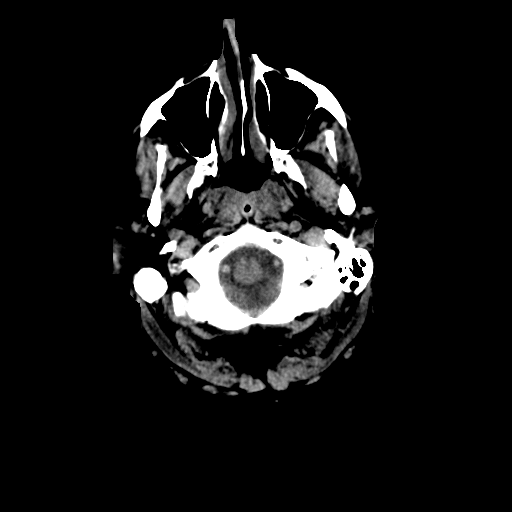
[im 3/32  bone]
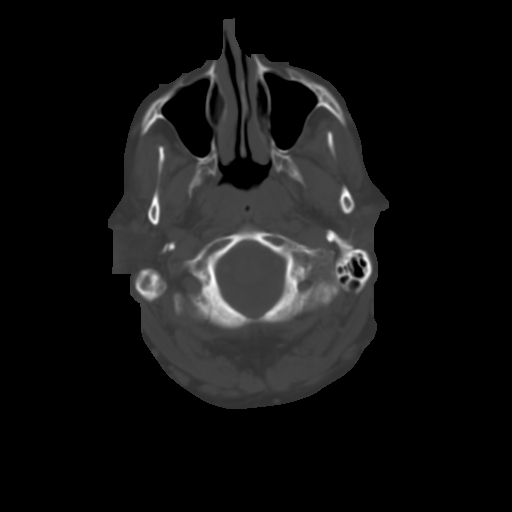
[im 5/32  brain]
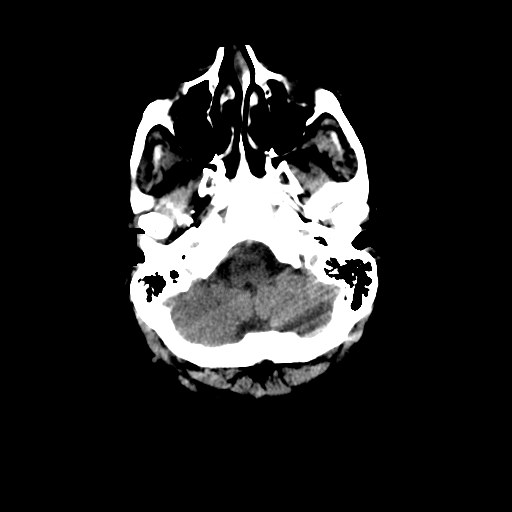
[im 7/32  brain]
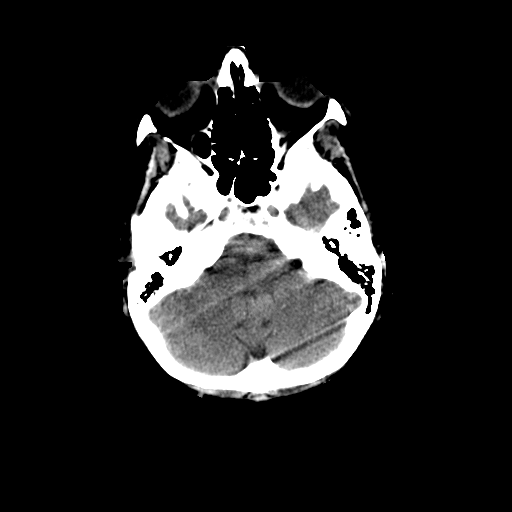
[im 9/32  brain]
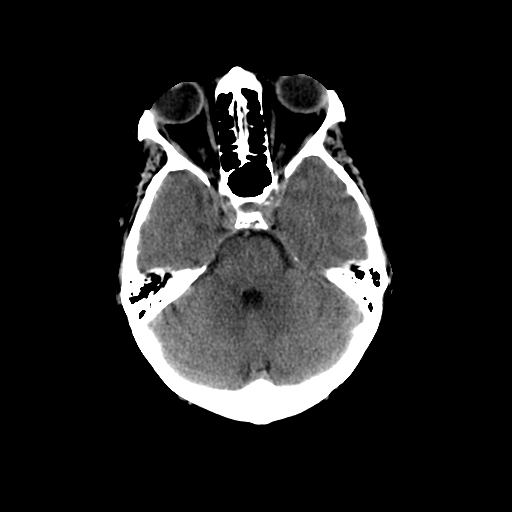
[im 12/32  brain]
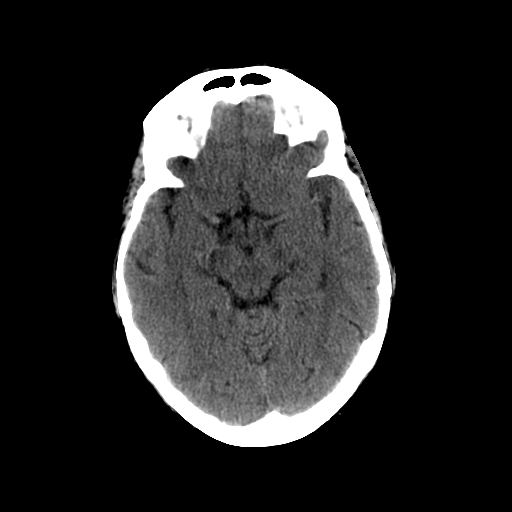
[im 12/32  bone]
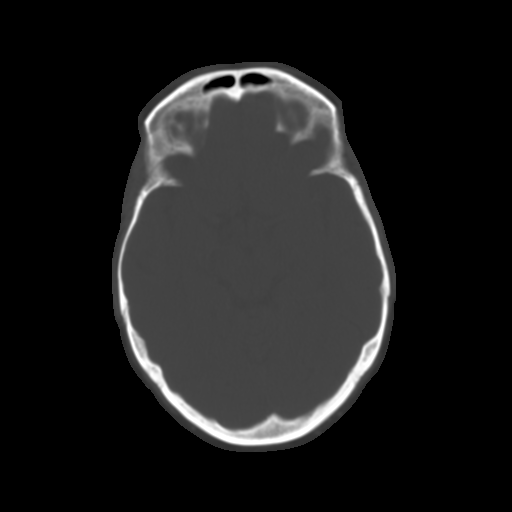
[im 14/32  brain]
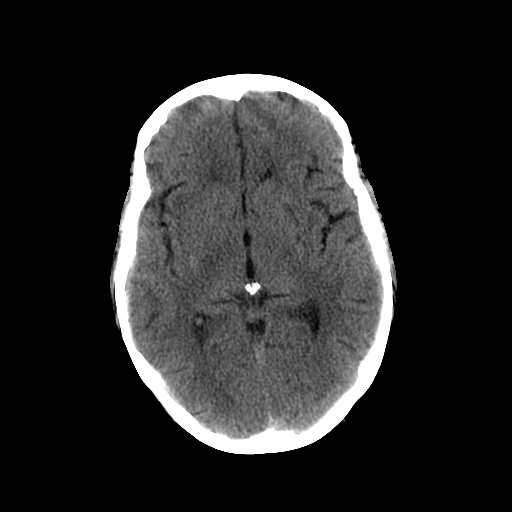
[im 16/32  brain]
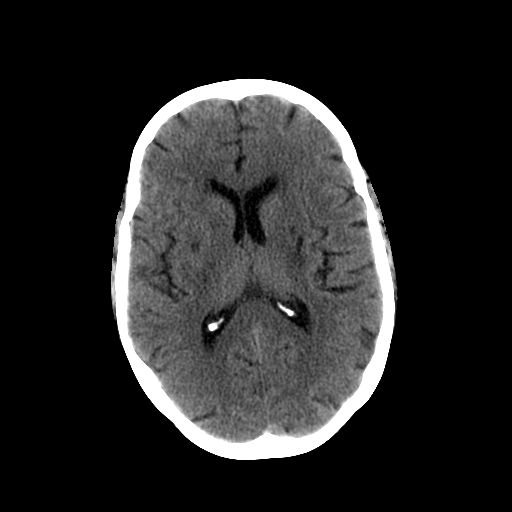
[im 18/32  brain]
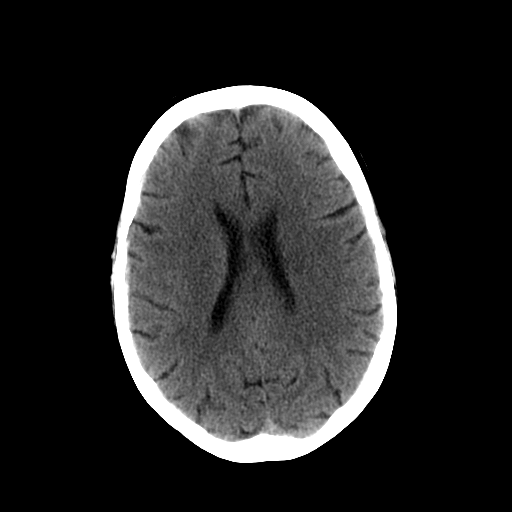
[im 20/32  brain]
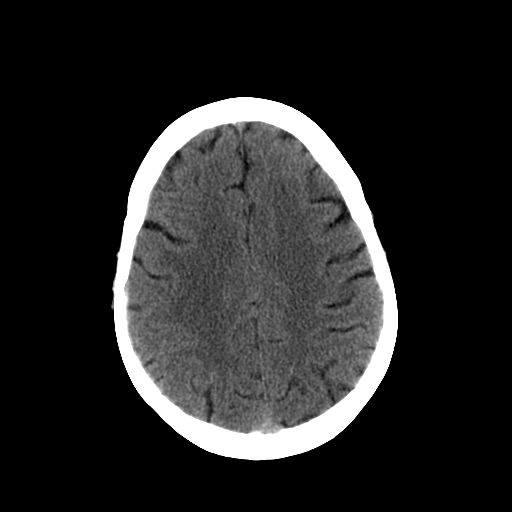
[im 20/32  bone]
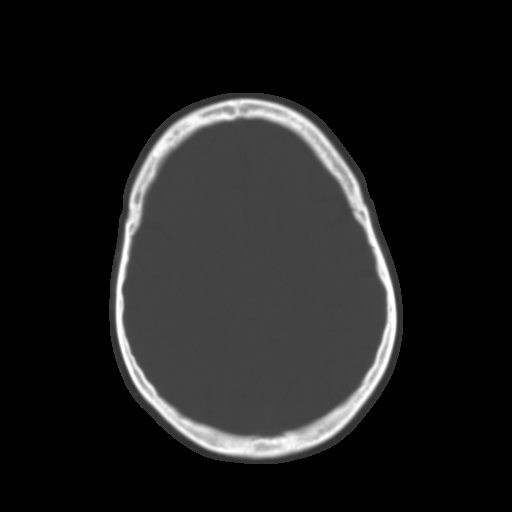
[im 23/32  brain]
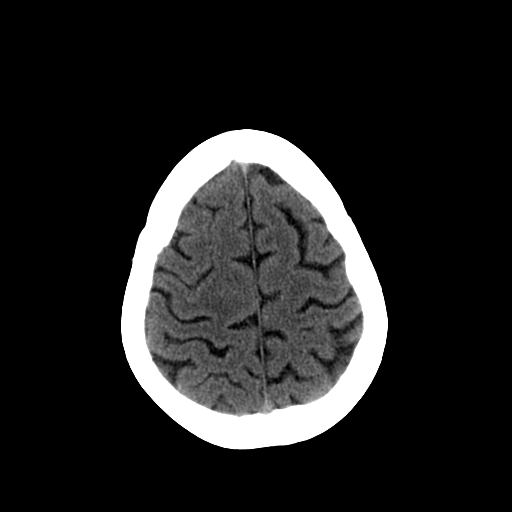
[im 25/32  brain]
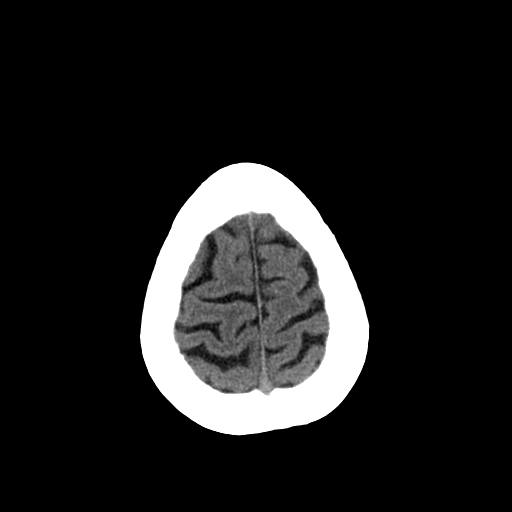
[im 27/32  brain]
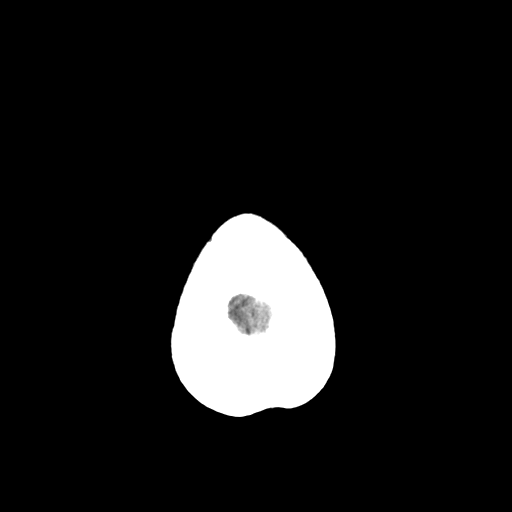
[im 29/32  brain]
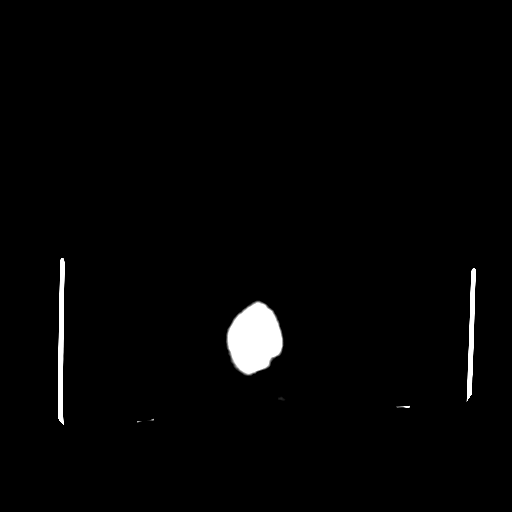
[im 29/32  bone]
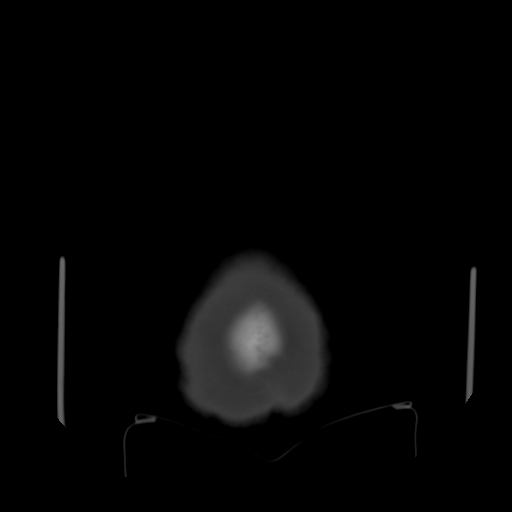

[13 of 30 positions shown; findings below may reference images not displayed]

FINDINGS: Dilated perivascular space within the right basal ganglia
is unchanged from previous exam.  Old left basal ganglia infarct is
similar to previous exam.  There is mild patchy low attenuation
within the subcortical white matter identified bilaterally
suggestive of chronic small vessel ischemic change.  The sulci and
ventricular volumes appear within normal limits.  The midline is
maintained. There is no evidence for acute brain infarct,
hemorrhage or mass. The mastoid air cells and paranasal sinuses
appear clear.  The skull appears intact.
IMPRESSION: 1.  No acute intracranial abnormalities.
2.  Small vessel ischemic change.
3.  Chronic left basal ganglia lacunar infarcts.

## 2013-10-19 NOTE — Progress Notes (Signed)
PATIENT: Anna Lyons DOB: 1956/02/01  REASON FOR VISIT: routine follow up for stroke, seizure HISTORY FROM: patient  HISTORY OF PRESENT ILLNESS: UPDATE 10/19/13 (LL):  Patient returns for stroke followup accompanied by her daughter. She reports that she had stroke like symptoms in July and was admitted to [facility].  Stroke workup was negative and was found to have a UTI. She started back smoking. She is having increased fatigue, stating she feels like sleeping all day. She claims she does not sleep soundly at night and never wakes up feeling refreshed.  Her daughter states she snores loudly. Blood pressure is well controlled, it is 105/66 in the office today.  Dr. Arelia Sneddon is treating her RLS with Klonopin, which she states relieves her symptoms. She is on her last day of Amoxicillin for left ear infection, but states she feels as though she cannot hear; everything sounds muffled. She is tolerating aspirin well with no signs of significant bleeding or bruising.  UPDATE 04/04/13 (LL): Patient comes in for revisit. She has had increased pain in right shoulder and very decreased ROM, and feels like she is dragging her right foot, but denies falls. States she feels like she lays in bed all day due to the pain in her right shoulder, then cannot sleep at night. Denies falls since last visit. BP is well controlled, is 111/71 in office today. Tolerating Keppra and aspirin well without side effects.  UPDATE 12/01/12 (LL): Anna Lyons comes to office for stroke revisit. She reports that she has been having increased trouble with balance in the last couple months, with 3-4 falls in the last month which sent her to the ER. Repeat MRI did not show new stroke. She states she feels like she is going to the side when she walks. She has increased pain in right shoulder and decreased ROM, and feels like she is dragging her right foot sometimes. She is suffering with insomnia, waking frequently through the night, and was  prescribed Clonazepam by her PCP. Her depression is much better since last visit.  UPDATE 08/28/12 (LL): Anna Lyons returns to office for follow up of seizures (2) which occurred on 05/19/12. They occurred while patient was staying with sick husband in hospital, was sleep deprived and stressed out. Husband subsequently died one month ago. Patient has not had anymore seizures, is maintained on Keppra 569m BID. No new neurovascular symptoms. Not sleeping well per daughter, patient has been very depressed since husband's death. Zoloft was increased by PCP. RLS symptoms worse, needs refill on Requip. Dr. EArelia Sneddonmanages HTN and cholesterol, patient does not know last lab values. Daughter reports BP med was recently changed due to hypokalemia. Patient reportedly stating smoking again when husband was terminal with cancer. Plans on quitting asap. Patient c/o right shoulder pain, weak from stroke and exacerbated by moving/carrying boxes.  Anna AUSLEYis a 58y.o. 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 The first happened on the floor of the hospital that she was visiting her husband who was a patient. She is staying with him and was incontinent of both stool and urine and had what was reported to be should shaking activity. She was then taken to the emergency room where she was witnessed to have right head and eye deviation followed by tonic-clonic activity. She was given Ativan and Versed. MRI showed only evidence of  a prior CVA, and EEG was unremarkable. The patient's seizure was likely a combination of a lowered seizure threshold, due to sleep deprivation in the context of visiting her hospitalized husband, and a predisposition to seizures, possibly related to her old CVA. The patient was started on Keppra, with no further seizure activity during the  hospitalization.  02/17/12 PRIOR HPI (PS): 58 year old female was last seen in hospital for stroke or March of 2013. At that time she received TPA. Workup showed normal carotid Dopplers, 2-D echo showed 55-60% EF, negative CT angiogram extracranial vessels, negative CT brain, and positive stroke in the left posterior lentiform nucleus seen on MRI. She was discharged on aspirin 325 mg at that time. She was doing well until 11/16/2011 a.m. She awoke at 8 AM in normal state. She ate breakfast at 10:30 and noted she had difficulty chewing her food. She brushed her teeth at 11 AM and noticed a right facial droop. No other symptoms. Initial CT head showed no acute infarct or bleed. Facial droop was still present. During further exam her facial droop improved and she is now back to her baseline. Initial NIH SS of 2 for right facial droop and pronator drift. CT of the brain 11/16/2011 remote left basal ganglia lacunar infarction. Mild white matter changes as above. No definite CT evidence for acute intracranial abnormality. MRI of the brain Kober 15 2013 chronic left hemisphere infarction as described. Small foci of restricted diffusion in the left periventricular region could represent acute or chronic ischemia. Mild atrophy and mild chronic microvascular ischemic change. Chest x-ray 11/16/2011 cardiomegaly without congestive failure.   REVIEW OF SYSTEMS: Full 14 system review of systems performed and notable only for:  constitutional: fatigue  ear/nose/throat: ringing in ears, ear pain musculoskeletal: walking difficulty neurological: memory loss sleep: daytime sleepiness, restless legs  psychiatric: depression  ALLERGIES: Allergies  Allergen Reactions  . Codeine Itching    HOME MEDICATIONS: Outpatient Prescriptions Prior to Visit  Medication Sig Dispense Refill  . aspirin EC 325 MG tablet Take 325 mg by mouth daily.      [name] atorvastatin (LIPITOR) 80 MG tablet Take 80 mg by mouth daily.      .  baclofen (LIORESAL) 20 MG tablet Take 0.5 tablets (10 mg total) by mouth 3 (three) times daily.  90 each  5  . clonazePAM (KLONOPIN) 1 MG tablet Take 1 mg by mouth at bedtime.      . hydrOXYzine (VISTARIL) 25 MG capsule Take 25 mg by mouth daily as needed for anxiety.      . levETIRAcetam (KEPPRA) 500 MG tablet TAKE ONE TABLET BY MOUTH TWICE DAILY  180 tablet  0  . sertraline (ZOLOFT) 100 MG tablet Take 200 mg by mouth daily.       [name] triamterene-hydrochlorothiazide (MAXZIDE) 75-50 MG per tablet Take 0.5 tablets by mouth daily.       No facility-administered medications prior to visit.    PHYSICAL EXAM Filed Vitals:   10/19/13 1521  BP: 105/66  Pulse: 70  Height: '5\' 3"'  (1.6 m)  Weight: 150 lb 9.6 oz (68.312 kg)   Body mass index is 26.68 kg/(m^2).  Generalized: Middle-aged Caucasian female in no acute distress. Neck: Supple, no carotid bruits  Ears: Right external ear canal occluded with hard cerumen. Left TM is red, not bulging. Cardiac: Regular rate rhythm, no murmur  Pulmonary: Clear to auscultation bilaterally  Musculoskeletal: Mild right foot drop   Neurological examination  Mentation: Alert oriented to time,  place, history taking, language fluent, with mild dysarthria.  Cranial nerve II-XII: Pupils were equal round reactive to light extraocular movements were full, visual field were full on confrontational test. facial sensation and strength were normal. hearing was intact to finger rubbing bilaterally. Uvula tongue midline. head turning and shoulder shrug and were normal and symmetric.Tongue protrusion into cheek strength was normal.  MOTOR: Increased tone on the right. Mild right upper and lower extremity drift with 3/5 strength weakness of RUE and intrinsic hand muscles. Right UE ROM limited by pain. Left orbits right arm.  SENSORY: normal and symmetric to light touch, temperature, vibration  COORDINATION: normal finger-nose-finger, heel-to-shin bilaterally  REFLEXES:  increased on right side  GAIT/STATION: Rising up from seated position without assistance, right hemiplegic gait with cane, unable to perform tiptoe, and heel walking without difficulty. Romberg positive.   DIAGNOSTIC DATA (LABS, IMAGING, TESTING) - I reviewed patient records, labs, notes, testing and imaging myself where available.  Lab Results  Component Value Date   WBC 5.8 08/15/2013   HGB 12.2 08/15/2013   HCT 35.5* 08/15/2013   MCV 89.0 08/15/2013   PLT 162 08/15/2013      Component Value Date/Time   NA 140 08/15/2013 2226   K 3.8 08/15/2013 2226   CL 98 08/15/2013 2226   CO2 27 08/15/2013 2226   GLUCOSE 76 08/15/2013 2226   BUN 11 08/15/2013 2226   CREATININE 0.64 08/15/2013 2226   CALCIUM 9.3 08/15/2013 2226   PROT 7.0 05/19/2012 1235   ALBUMIN 3.6 05/19/2012 1235   AST 27 05/19/2012 1235   ALT 15 05/19/2012 1235   ALKPHOS 62 05/19/2012 1235   BILITOT 0.5 05/19/2012 1235   GFRNONAA >90 08/15/2013 2226   GFRAA >90 08/15/2013 2226   Lab Results  Component Value Date   CHOL 94 08/16/2013   HDL 38* 08/16/2013   LDLCALC 40 08/16/2013   TRIG 81 08/16/2013   CHOLHDL 2.5 08/16/2013   Lab Results  Component Value Date   HGBA1C 5.8* 08/16/2013    ASSESSMENT: Anna Lyons is a 58 y.o. 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 The patient's seizure was likely a combination of a lowered seizure threshold, due to sleep deprivation in the context of visiting her hospitalized husband, and a predisposition to seizures, possibly related to her old CVA. Complaints of excessive daytime sleepiness, fatigue, and snoring may indicate obstructive sleep apnea, will refer for sleep study. Epworth 17, Fatigue Severity score 60.  PLAN:  Get Debrox Ear Wax Removal kit and use according to directions on both ears for excess cerumen. Use Zyrtec (generic) for 2 weeks to dry up  any congestion in inner ear. I will refer you for a sleep study to evaluate for obstructive sleep apnea. Rx given for rolling walker with seat. Change Keppra to half tablet in the morning and whole tablet at night. We will try to continue to wean over time. Continue Baclofen for muscle spasticity.  Continue aspirin 325 mg orally every day for secondary stroke prevention and maintain strict control of hypertension with blood pressure goal below 130/90, diabetes with hemoglobin A1c goal below 6.5% and lipids with LDL cholesterol goal below 100 mg/dL.  Follow up in 6 months, sooner as needed.  Orders Placed This Encounter  Procedures  . For home use only DME 4 wheeled rolling walker with seat  . Ambulatory referral to  Sleep Studies   Rudi Rummage Dennison Mcdaid, MSN, FNP-BC, A/GNP-C 10/19/2013, 5:01 PM [facility] [address] [place], [place] [address] [phone]  Note: This document was prepared with digital dictation and possible smart phrase technology. Any transcriptional errors that result from this process are unintentional.

## 2013-10-19 NOTE — Patient Instructions (Addendum)
Get Debrox Ear Wax Removal kit and use according to directions on both ears.  Use Zyrtec (generic) for 2 weeks to dry up any congestion in inner ear.  If you get ear pain in the ear that was infected, please see your family doctor.  I will refer you for a sleep study to evaluate for obstructive sleep apnea.  Change Keppra to half tablet in the morning and whole tablet at night. Continue Baclofen for muscle spasticity.  Continue aspirin 325 mg orally every day for secondary stroke prevention and maintain strict control of hypertension with blood pressure goal below 130/90, diabetes with hemoglobin A1c goal below 6.5% and lipids with LDL cholesterol goal below 100 mg/dL.  Follow up in 6 months, sooner as needed.  Stroke Prevention Some medical conditions and behaviors are associated with an increased chance of having a stroke. You may prevent a stroke by making healthy choices and managing medical conditions. HOW CAN I REDUCE MY RISK OF HAVING A STROKE?   Stay physically active. Get at least 30 minutes of activity on most or all days.  Do not smoke. It may also be helpful to avoid exposure to secondhand smoke.  Limit alcohol use. Moderate alcohol use is considered to be:  No more than 2 drinks per day for men.  No more than 1 drink per day for nonpregnant women.  Eat healthy foods. This involves:  Eating 5 or more servings of fruits and vegetables a day.  Making dietary changes that address high blood pressure (hypertension), high cholesterol, diabetes, or obesity.  Manage your cholesterol levels.  Making food choices that are high in fiber and low in saturated fat, trans fat, and cholesterol may control cholesterol levels.  Take any prescribed medicines to control cholesterol as directed by your health care provider.  Manage your diabetes.  Controlling your carbohydrate and sugar intake is recommended to manage diabetes.  Take any prescribed medicines to control diabetes as  directed by your health care provider.  Control your hypertension.  Making food choices that are low in salt (sodium), saturated fat, trans fat, and cholesterol is recommended to manage hypertension.  Take any prescribed medicines to control hypertension as directed by your health care provider.  Maintain a healthy weight.  Reducing calorie intake and making food choices that are low in sodium, saturated fat, trans fat, and cholesterol are recommended to manage weight.  Stop drug abuse.  Avoid taking birth control pills.  Talk to your health care provider about the risks of taking birth control pills if you are over 72 years old, smoke, get migraines, or have ever had a blood clot.  Get evaluated for sleep disorders (sleep apnea).  Talk to your health care provider about getting a sleep evaluation if you snore a lot or have excessive sleepiness.  Take medicines only as directed by your health care provider.  For some people, aspirin or blood thinners (anticoagulants) are helpful in reducing the risk of forming abnormal blood clots that can lead to stroke. If you have the irregular heart rhythm of atrial fibrillation, you should be on a blood thinner unless there is a good reason you cannot take them.  Understand all your medicine instructions.  Make sure that other conditions (such as anemia or atherosclerosis) are addressed. SEEK IMMEDIATE MEDICAL CARE IF:   You have sudden weakness or numbness of the face, arm, or leg, especially on one side of the body.  Your face or eyelid droops to one side.  You  have sudden confusion.  You have trouble speaking (aphasia) or understanding.  You have sudden trouble seeing in one or both eyes.  You have sudden trouble walking.  You have dizziness.  You have a loss of balance or coordination.  You have a sudden, severe headache with no known cause.  You have new chest pain or an irregular heartbeat. Any of these symptoms may  represent a serious problem that is an emergency. Do not wait to see if the symptoms will go away. Get medical help at once. Call your local emergency services (911 in [place] Do not drive yourself to the hospital. Document Released: 02/26/2004 Document Revised: 06/04/2013 Document Reviewed: 07/21/2012 [facility] Patient Information 2015 [place], [place]. This information is not intended to replace advice given to you by your health care provider. Make sure you discuss any questions you have with your health care provider.

## 2013-10-22 ENCOUNTER — Telehealth: Payer: Self-pay | Admitting: Neurology

## 2013-10-22 DIAGNOSIS — I639 Cerebral infarction, unspecified: Secondary | ICD-10-CM

## 2013-10-22 DIAGNOSIS — G458 Other transient cerebral ischemic attacks and related syndromes: Secondary | ICD-10-CM

## 2013-10-22 DIAGNOSIS — G2581 Restless legs syndrome: Secondary | ICD-10-CM

## 2013-10-22 DIAGNOSIS — G40909 Epilepsy, unspecified, not intractable, without status epilepticus: Secondary | ICD-10-CM

## 2013-10-22 DIAGNOSIS — R400 Somnolence: Secondary | ICD-10-CM

## 2013-10-22 DIAGNOSIS — R0683 Snoring: Secondary | ICD-10-CM

## 2013-10-22 NOTE — Telephone Encounter (Signed)
Anna Hawking Lam,NP refers patient for attended sleep study.  Height: 5'3  Weight: 150 lb  BMI: 26.68  Past Medical History: Hx. Stroke, Right Spastic Hemiparess, RLS  Sleep Symptoms: Excessive daytime sleepiness, snoring   Epworth Score: 17  Medications: Aspirin (Tablet Delayed Response) aspirin EC 325 MG Take 325 mg by mouth daily. Atorvastatin Calcium (Tab) LIPITOR 80 MG Take 80 mg by mouth daily. Baclofen (Tab) LIORESAL 20 MG Take 0.5 tablets (10 mg total) by mouth 3 (three) times daily. ClonazePAM (Tab) KLONOPIN 1 MG Take 1 mg by mouth at bedtime. HydrOXYzine Pamoate (Cap) VISTARIL 25 MG Take 25 mg by mouth daily as needed for anxiety. LevETIRAcetam (Tab) KEPPRA 500 MG TAKE ONE TABLET BY MOUTH TWICE DAILY Sertraline HCl (Tab) ZOLOFT 100 MG Take 200 mg by mouth daily. Triamterene-HCTZ (Tab) MAXZIDE 75-50 MG Take 0.5 tablets by mouth daily.   Insurance: Anna Lyons's Assessment & Plan:  Anna Lyons is a 58 y.o. 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 The patient's seizure was likely a combination of a lowered seizure threshold, due to sleep deprivation in the context of visiting her hospitalized husband, and a predisposition to seizures, possibly related to her old CVA. Complaints of excessive daytime sleepiness, fatigue, and snoring may indicate obstructive sleep apnea, will refer for sleep study. Epworth 17, Fatigue Severity score 60.  PLAN:  Get Debrox Ear Wax Removal kit and use according to directions on both ears.  Use Zyrtec (generic) for 2 weeks to dry up any congestion in inner ear.  I will refer you for a sleep study to evaluate for obstructive sleep apnea.  Rx given for rolling walker with seat.  Change Keppra to half tablet in the morning and whole tablet at night.  Continue Baclofen for muscle spasticity.  Continue aspirin 325 mg  orally every day for secondary stroke prevention and maintain strict control of hypertension with blood pressure goal below 130/90, diabetes with hemoglobin A1c goal below 6.5% and lipids with LDL cholesterol goal below 100 mg/dL   Please review patient information and submit instructions for scheduling and orders for sleep technologist.  Thank you!

## 2013-10-23 ENCOUNTER — Ambulatory Visit: Payer: 59 | Admitting: Physical Medicine & Rehabilitation

## 2013-10-23 NOTE — Telephone Encounter (Signed)
Sleep study request review: This patient has an underlying medical history of smoking, stroke, TIA, seizures, hyperlipidemia and is referred by Charlott Holler, NP for an attended sleep study due to a report of snoring, excessive daytime somnolence and restless leg symptoms. I will order a split-night sleep study and see the patient in sleep medicine consultation afterwards. Please print this note and attach to chart.   Technologist instructions: Please score at 4% and split if 2 hour estimated AHI >20/h. Full EEG montage.    Star Age, MD, PhD [facility])

## 2013-10-23 NOTE — Progress Notes (Signed)
I agree with the above plan 

## 2013-11-01 ENCOUNTER — Telehealth: Payer: Self-pay | Admitting: Nurse Practitioner

## 2013-11-01 NOTE — Telephone Encounter (Signed)
Called miss Dieguez and left a voice mail asking her to return my call

## 2013-11-01 NOTE — Telephone Encounter (Signed)
Patient returning Anna Lyons call.

## 2013-11-06 NOTE — Telephone Encounter (Signed)
Patient didn't know why she was called.. But i realized the patient declined PT but now wants it. She will call and schedule an appointment.

## 2013-11-13 ENCOUNTER — Ambulatory Visit (HOSPITAL_BASED_OUTPATIENT_CLINIC_OR_DEPARTMENT_OTHER): Payer: Medicare Other | Admitting: Physical Medicine & Rehabilitation

## 2013-11-13 ENCOUNTER — Encounter: Payer: Medicare Other | Attending: Physical Medicine & Rehabilitation

## 2013-11-13 ENCOUNTER — Encounter: Payer: Self-pay | Admitting: Physical Medicine & Rehabilitation

## 2013-11-13 VITALS — BP 110/66 | HR 78 | Resp 14 | Ht 63.0 in | Wt 150.4 lb

## 2013-11-13 DIAGNOSIS — M7501 Adhesive capsulitis of right shoulder: Secondary | ICD-10-CM | POA: Diagnosis present

## 2013-11-13 MED ORDER — TRAMADOL-ACETAMINOPHEN 37.5-325 MG PO TABS: 1.0000 | ORAL_TABLET | Freq: Three times a day (TID) | ORAL | Status: DC | PRN

## 2013-11-13 NOTE — Progress Notes (Signed)
Shoulder injection Right intra articular With ultrasound guidance  Indication:RightShoulder pain not relieved by medication management and other conservative care.  Informed consent was obtained after describing risks and benefits of the procedure with the patient, this includes bleeding, bruising, infection and medication side effects. The patient wishes to proceed and has given written consent. Patient was placed in a seated position. The Right shoulder was marked and prepped with betadine. A 25-gauge 1-1/2 inch needle was used a to anesthesthetize the skin and subcut tissue with 38ml of 1% Lidocoaine, then a 22gauge Echobloc needle was inserted into the Right glenohumeral joint. After negative draw back for blood, a solution containing 1 mL of 6 mg per ML betamethasone and 4 mL of 1% lidocaine was injected. A band aid was applied. The patient tolerated the procedure well. Post procedure instructions were given.  No pain in the right shoulder with range of motion post procedure Discussed possible need for repeat procedure one month

## 2013-11-13 NOTE — Patient Instructions (Signed)
Joint Injection  Care After  Refer to this sheet in the next few days. These instructions provide you with information on caring for yourself after you have had a joint injection. Your caregiver also may give you more specific instructions. Your treatment has been planned according to current medical practices, but problems sometimes occur. Call your caregiver if you have any problems or questions after your procedure.  After any type of joint injection, it is not uncommon to experience:  · Soreness, swelling, or bruising around the injection site.  · Mild numbness, tingling, or weakness around the injection site caused by the numbing medicine used before or with the injection.  It also is possible to experience the following effects associated with the specific agent after injection:  · Iodine-based contrast agents:  ¨ Allergic reaction (itching, hives, widespread redness, and swelling beyond the injection site).  · Corticosteroids (These effects are rare.):  ¨ Allergic reaction.  ¨ Increased blood sugar levels (If you have diabetes and you notice that your blood sugar levels have increased, notify your caregiver).  ¨ Increased blood pressure levels.  ¨ Mood swings.  · Hyaluronic acid in the use of viscosupplementation.  ¨ Temporary heat or redness.  ¨ Temporary rash and itching.  ¨ Increased fluid accumulation in the injected joint.  These effects all should resolve within a day after your procedure.   HOME CARE INSTRUCTIONS  · Limit yourself to light activity the day of your procedure. Avoid lifting heavy objects, bending, stooping, or twisting.  · Take prescription or over-the-counter pain medication as directed by your caregiver.  · You may apply ice to your injection site to reduce pain and swelling the day of your procedure. Ice may be applied 03-04 times:  ¨ Put ice in a plastic bag.  ¨ Place a towel between your skin and the bag.  ¨ Leave the ice on for no longer than 15-20 minutes each time.  SEEK  IMMEDIATE MEDICAL CARE IF:   · Pain and swelling get worse rather than better or extend beyond the injection site.  · Numbness does not go away.  · Blood or fluid continues to leak from the injection site.  · You have chest pain.  · You have swelling of your face or tongue.  · You have trouble breathing or you become dizzy.  · You develop a fever, chills, or severe tenderness at the injection site that last longer than 1 day.  MAKE SURE YOU:  · Understand these instructions.  · Watch your condition.  · Get help right away if you are not doing well or if you get worse.  Document Released: 10/01/2010 Document Revised: 04/12/2011 Document Reviewed: 10/01/2010  [employer]® Patient Information ©2015 [employer]. This information is not intended to replace advice given to you by your health care provider. Make sure you discuss any questions you have with your health care provider.

## 2013-11-27 ENCOUNTER — Other Ambulatory Visit: Payer: Self-pay

## 2013-11-27 DIAGNOSIS — N644 Mastodynia: Secondary | ICD-10-CM

## 2013-12-13 ENCOUNTER — Ambulatory Visit (HOSPITAL_BASED_OUTPATIENT_CLINIC_OR_DEPARTMENT_OTHER): Payer: BC Managed Care – HMO | Admitting: Physical Medicine & Rehabilitation

## 2013-12-13 ENCOUNTER — Encounter: Payer: Medicare Other | Attending: Physical Medicine & Rehabilitation

## 2013-12-13 ENCOUNTER — Encounter: Payer: Self-pay | Admitting: Physical Medicine & Rehabilitation

## 2013-12-13 VITALS — BP 109/78 | HR 78 | Resp 14 | Ht 64.0 in | Wt 143.0 lb

## 2013-12-13 DIAGNOSIS — M7501 Adhesive capsulitis of right shoulder: Secondary | ICD-10-CM | POA: Diagnosis present

## 2013-12-13 NOTE — Patient Instructions (Signed)
Joint Injection  Care After  Refer to this sheet in the next few days. These instructions provide you with information on caring for yourself after you have had a joint injection. Your caregiver also may give you more specific instructions. Your treatment has been planned according to current medical practices, but problems sometimes occur. Call your caregiver if you have any problems or questions after your procedure.  After any type of joint injection, it is not uncommon to experience:  · Soreness, swelling, or bruising around the injection site.  · Mild numbness, tingling, or weakness around the injection site caused by the numbing medicine used before or with the injection.  It also is possible to experience the following effects associated with the specific agent after injection:  · Iodine-based contrast agents:  ¨ Allergic reaction (itching, hives, widespread redness, and swelling beyond the injection site).  · Corticosteroids (These effects are rare.):  ¨ Allergic reaction.  ¨ Increased blood sugar levels (If you have diabetes and you notice that your blood sugar levels have increased, notify your caregiver).  ¨ Increased blood pressure levels.  ¨ Mood swings.  · Hyaluronic acid in the use of viscosupplementation.  ¨ Temporary heat or redness.  ¨ Temporary rash and itching.  ¨ Increased fluid accumulation in the injected joint.  These effects all should resolve within a day after your procedure.   HOME CARE INSTRUCTIONS  · Limit yourself to light activity the day of your procedure. Avoid lifting heavy objects, bending, stooping, or twisting.  · Take prescription or over-the-counter pain medication as directed by your caregiver.  · You may apply ice to your injection site to reduce pain and swelling the day of your procedure. Ice may be applied 03-04 times:  ¨ Put ice in a plastic bag.  ¨ Place a towel between your skin and the bag.  ¨ Leave the ice on for no longer than 15-20 minutes each time.  SEEK  IMMEDIATE MEDICAL CARE IF:   · Pain and swelling get worse rather than better or extend beyond the injection site.  · Numbness does not go away.  · Blood or fluid continues to leak from the injection site.  · You have chest pain.  · You have swelling of your face or tongue.  · You have trouble breathing or you become dizzy.  · You develop a fever, chills, or severe tenderness at the injection site that last longer than 1 day.  MAKE SURE YOU:  · Understand these instructions.  · Watch your condition.  · Get help right away if you are not doing well or if you get worse.  Document Released: 10/01/2010 Document Revised: 04/12/2011 Document Reviewed: 10/01/2010  [employer]® Patient Information ©2015 [employer]. This information is not intended to replace advice given to you by your health care provider. Make sure you discuss any questions you have with your health care provider.

## 2013-12-13 NOTE — Progress Notes (Signed)
Shoulder injection right With  ultrasound guidance  Indication: Right Shoulder pain not relieved by medication management and other conservative care.  Informed consent was obtained after describing risks and benefits of the procedure with the patient, this includes bleeding, bruising, infection and medication side effects. The patient wishes to proceed and has given written consent. Patient was placed in a seated position. The Right shoulder was marked and prepped with betadine in the subacromial area. A 25-gauge 1-1/2 inch needle was inserted into the subacromial area. After negative draw back for blood, a solution containing 1 mL of 6 mg per ML betamethasone and 4 mL of 1% lidocaine was injected. A band aid was applied. The patient tolerated the procedure well. Post procedure instructions were given. 

## 2013-12-20 ENCOUNTER — Ambulatory Visit (INDEPENDENT_AMBULATORY_CARE_PROVIDER_SITE_OTHER): Payer: BC Managed Care – HMO | Admitting: Internal Medicine

## 2013-12-20 ENCOUNTER — Encounter: Payer: Self-pay | Admitting: Internal Medicine

## 2013-12-20 ENCOUNTER — Other Ambulatory Visit (INDEPENDENT_AMBULATORY_CARE_PROVIDER_SITE_OTHER): Payer: BC Managed Care – HMO

## 2013-12-20 VITALS — BP 110/64 | HR 77 | Temp 98.1°F | Resp 16 | Ht 64.0 in | Wt 149.1 lb

## 2013-12-20 DIAGNOSIS — Z853 Personal history of malignant neoplasm of breast: Secondary | ICD-10-CM

## 2013-12-20 DIAGNOSIS — Z418 Encounter for other procedures for purposes other than remedying health state: Secondary | ICD-10-CM

## 2013-12-20 DIAGNOSIS — I10 Essential (primary) hypertension: Secondary | ICD-10-CM

## 2013-12-20 DIAGNOSIS — Z0000 Encounter for general adult medical examination without abnormal findings: Secondary | ICD-10-CM

## 2013-12-20 DIAGNOSIS — Z23 Encounter for immunization: Secondary | ICD-10-CM

## 2013-12-20 DIAGNOSIS — N644 Mastodynia: Secondary | ICD-10-CM

## 2013-12-20 DIAGNOSIS — D649 Anemia, unspecified: Secondary | ICD-10-CM

## 2013-12-20 DIAGNOSIS — R569 Unspecified convulsions: Secondary | ICD-10-CM

## 2013-12-20 DIAGNOSIS — I69351 Hemiplegia and hemiparesis following cerebral infarction affecting right dominant side: Secondary | ICD-10-CM

## 2013-12-20 DIAGNOSIS — I69851 Hemiplegia and hemiparesis following other cerebrovascular disease affecting right dominant side: Secondary | ICD-10-CM

## 2013-12-20 DIAGNOSIS — Z720 Tobacco use: Secondary | ICD-10-CM

## 2013-12-20 DIAGNOSIS — Z299 Encounter for prophylactic measures, unspecified: Secondary | ICD-10-CM

## 2013-12-20 LAB — COMPREHENSIVE METABOLIC PANEL
ALK PHOS: 62 U/L (ref 39–117)
ALT: 16 U/L (ref 0–35)
AST: 22 U/L (ref 0–37)
Albumin: 4 g/dL (ref 3.5–5.2)
BILIRUBIN TOTAL: 0.9 mg/dL (ref 0.2–1.2)
BUN: 14 mg/dL (ref 6–23)
CO2: 33 mEq/L — ABNORMAL HIGH (ref 19–32)
Calcium: 9.2 mg/dL (ref 8.4–10.5)
Chloride: 97 mEq/L (ref 96–112)
Creatinine, Ser: 0.7 mg/dL (ref 0.4–1.2)
GFR: 90.76 mL/min (ref >60.00)
Glucose, Bld: 93 mg/dL (ref 70–99)
Potassium: 4.1 mEq/L (ref 3.5–5.1)
SODIUM: 138 meq/L (ref 135–145)
TOTAL PROTEIN: 7.5 g/dL (ref 6.0–8.3)

## 2013-12-20 LAB — CBC
HEMATOCRIT: 37.8 % (ref 36.0–46.0)
HEMOGLOBIN: 12.6 g/dL (ref 12.0–15.0)
MCHC: 33.4 g/dL (ref 30.0–36.0)
MCV: 91.9 fl (ref 78.0–100.0)
Platelets: 179 10*3/uL (ref 150.0–400.0)
RBC: 4.12 Mil/uL (ref 3.87–5.11)
RDW: 13 % (ref 11.5–15.5)
WBC: 6.8 10*3/uL (ref 4.0–10.5)

## 2013-12-20 LAB — HEMOGLOBIN A1C: Hgb A1c MFr Bld: 5.8 % (ref 4.6–6.5)

## 2013-12-20 LAB — LIPID PANEL
CHOL/HDL RATIO: 3
Cholesterol: 132 mg/dL (ref 0–200)
HDL: 46.3 mg/dL (ref >39.00)
LDL CALC: 58 mg/dL (ref 0–99)
NONHDL: 85.7
Triglycerides: 139 mg/dL (ref 0.0–149.0)
VLDL: 27.8 mg/dL (ref 0.0–40.0)

## 2013-12-20 MED ORDER — DICLOFENAC SODIUM 1 % TD GEL: 2.0000 g | Freq: Four times a day (QID) | TRANSDERMAL | Status: DC

## 2013-12-20 NOTE — Progress Notes (Signed)
Pre visit review using our clinic review tool, if applicable. No additional management support is needed unless otherwise documented below in the visit note. 

## 2013-12-20 NOTE — Patient Instructions (Signed)
For your breast pain we will recommend you to try ice instead of heat. He can use ice on for 15 minutes then off for 15 minutes. We will also give you a medication that is a cream that you can use directly on the area where you're having pain. He can use it up to 3 times a day. It is called voltaren gel.  We will send you for mammogram, we will also check blood work today. We will call you back with the results of the blood work.

## 2013-12-21 DIAGNOSIS — N644 Mastodynia: Secondary | ICD-10-CM | POA: Insufficient documentation

## 2013-12-21 NOTE — Assessment & Plan Note (Signed)
Given patient's history of breast cancer some time ago and her current breast pain will order mammogram diagnostic. She has no lumps or nipple discharge or skin change on clinical exam. Advised voltaren cream, ice.

## 2013-12-21 NOTE — Assessment & Plan Note (Signed)
Check CBC 

## 2013-12-21 NOTE — Assessment & Plan Note (Signed)
She still has weakness on the right side however is able to perform her ADLs and live independently.

## 2013-12-21 NOTE — Assessment & Plan Note (Signed)
She has not had seizure in some time and is currently weaning down off seizure medication. It was felt that her previous seizure was related to episode of insomnia.

## 2013-12-21 NOTE — Assessment & Plan Note (Signed)
Reminded her about the risk of cigarette smoking increasing her risk of having another stroke. Advised her strongly to quit.

## 2013-12-21 NOTE — Progress Notes (Signed)
   Subjective:    Patient ID: Anna Lyons, female    DOB: Aug 08, 1955, 58 y.o.   MRN: [id number]  HPI The patient is a 58 year old woman who comes in today as a new patient. She has past medical history of breast cancer, RLS, seizures, TIA, tobacco abuse, depression, right hemiparesis. She is currently having breast pain and is concerned that she may have problems. She's not noticed any lumps, she denies nipple discharge. She denies any new symptoms of stroke. She is able to do many things for herself. She does live alone with. She denies any recent chest pain, shortness of breath, abdominal pain, diarrhea, constipation.  Review of Systems  Constitutional: Negative for fever, activity change, appetite change, fatigue and unexpected weight change.  HENT: Negative.   Respiratory: Negative for cough, chest tightness, shortness of breath and wheezing.   Cardiovascular: Negative for chest pain, palpitations and leg swelling.  Gastrointestinal: Negative for abdominal pain, diarrhea, constipation, blood in stool and abdominal distention.  Musculoskeletal: Positive for myalgias. Negative for back pain and arthralgias.  Skin: Negative.   Neurological: Negative for dizziness, weakness, light-headedness and headaches.      Objective:   Physical Exam  Constitutional: She is oriented to person, place, and time. She appears well-developed and well-nourished.  HENT:  Head: Normocephalic and atraumatic.  Eyes: EOM are normal.  Neck: Normal range of motion. No JVD present.  Cardiovascular: Normal rate and regular rhythm.   Pulmonary/Chest: Effort normal and breath sounds normal. No respiratory distress. She has no wheezes. She has no rales.  Abdominal: Soft. Bowel sounds are normal. She exhibits no distension. There is no tenderness. There is no rebound.  Genitourinary:  Clinical breast exam without any masses or lumps, also no nipple discharge bilaterally. No skin changes or discoloration. She is status  post bilateral mastectomy.  Neurological: She is alert and oriented to person, place, and time.  Skin: Skin is warm and dry.   Filed Vitals:   12/20/13 1100  BP: 110/64  Pulse: 77  Temp: 98.1 F (36.7 C)  TempSrc: Oral  Resp: 16  Height: 5\' 4"  (1.626 m)  Weight: 149 lb 1.9 oz (67.64 kg)  SpO2: 97%      Assessment & Plan:

## 2013-12-21 NOTE — Assessment & Plan Note (Signed)
BP well controlled. Will check BMP. Continue HCTZ.

## 2013-12-25 ENCOUNTER — Other Ambulatory Visit: Payer: Self-pay

## 2013-12-25 DIAGNOSIS — N644 Mastodynia: Secondary | ICD-10-CM

## 2013-12-28 ENCOUNTER — Other Ambulatory Visit: Payer: Self-pay | Admitting: Nurse Practitioner

## 2013-12-30 NOTE — Telephone Encounter (Signed)
Last OV note says: Change Keppra to half tablet in the morning and whole tablet at night.

## 2014-01-09 ENCOUNTER — Other Ambulatory Visit: Payer: Self-pay | Admitting: Family Medicine

## 2014-01-09 DIAGNOSIS — N644 Mastodynia: Secondary | ICD-10-CM

## 2014-01-10 ENCOUNTER — Ambulatory Visit
Admission: RE | Admit: 2014-01-10 | Discharge: 2014-01-10 | Disposition: A | Discharge status: Home / Self Care | Payer: Medicare Other | Source: Ambulatory Visit

## 2014-01-10 ENCOUNTER — Encounter (HOSPITAL_COMMUNITY): Payer: Self-pay | Admitting: Cardiovascular Disease

## 2014-01-10 DIAGNOSIS — N644 Mastodynia: Secondary | ICD-10-CM

## 2014-01-17 ENCOUNTER — Encounter: Payer: Medicare Other | Attending: Physical Medicine & Rehabilitation

## 2014-01-17 ENCOUNTER — Ambulatory Visit: Payer: BC Managed Care – HMO | Admitting: Physical Medicine & Rehabilitation

## 2014-01-17 DIAGNOSIS — M7501 Adhesive capsulitis of right shoulder: Secondary | ICD-10-CM | POA: Insufficient documentation

## 2014-01-22 ENCOUNTER — Encounter: Payer: Self-pay | Admitting: Internal Medicine

## 2014-01-22 ENCOUNTER — Ambulatory Visit (INDEPENDENT_AMBULATORY_CARE_PROVIDER_SITE_OTHER): Payer: Medicare Other | Admitting: Internal Medicine

## 2014-01-22 VITALS — BP 120/80 | HR 82 | Temp 98.0°F | Wt 152.0 lb

## 2014-01-22 DIAGNOSIS — J4541 Moderate persistent asthma with (acute) exacerbation: Secondary | ICD-10-CM

## 2014-01-22 MED ORDER — PREDNISONE 20 MG PO TABS: 20.0000 mg | ORAL_TABLET | Freq: Two times a day (BID) | ORAL | Status: DC

## 2014-01-22 MED ORDER — AZITHROMYCIN 250 MG PO TABS: ORAL_TABLET | ORAL | Status: DC

## 2014-01-22 NOTE — Progress Notes (Signed)
   Subjective:    Patient ID: Anna Lyons, female    DOB: 1955/06/19, 58 y.o.   MRN: [id number]  HPI   She describes a cough present for at least a week; it was initially dry until 12/17. She now produces thick white secretions.  She's had associated sneezing, wheezing, shortness of breath  She's been a half-pack a day smoker for 40 years. She states she's not smoked since she got sick. Her daughter says her wheezing was audible last night.  PMH CVA & STEMI.  Review of Systems Frontal headache, facial pain , nasal purulence, dental pain, sore throat , otic pain or otic discharge denied. No fever , chills or sweats.     Objective:   Physical Exam  Pertinent or positive findings include: She has facial wrinkling  Upper plate; few remaining lower teeth. Right tympanic membrane is dull. She hass wax in the left canal. Nares are boggy. She has diffuse musical rhonchi and wheezing in all lung fields. Surprisingly there is no increased work of breathing Heart rhythm is regular without significant murmur gallop. She has no edema, clubbing, or cyanosis. No LA present     Assessment & Plan:  #1 asthmatic bronchitis  #2 smoker  #3 past medical history CVA  Plan: See orders recommendations. Smoking risk was discussed in reference to her stroke and the bronchospasm.

## 2014-01-22 NOTE — Patient Instructions (Addendum)
Anoro  one inhalation daily; gargle and spit after use. Lot #: [id number] Exp 02/17 Instructions written on box by Dr Glenard Haring Please think about quitting smoking. Review the risks we discussed. Please call 1-800-QUIT-NOW [phone]) for free smoking cessation counseling.  Please hold the atorvastatin until you've completed the antibiotic.

## 2014-01-22 NOTE — Progress Notes (Signed)
Pre visit review using our clinic review tool, if applicable. No additional management support is needed unless otherwise documented below in the visit note. 

## 2014-01-23 ENCOUNTER — Telehealth: Payer: Self-pay | Admitting: Family Medicine

## 2014-01-23 NOTE — Telephone Encounter (Signed)
emmi emailed °

## 2014-02-01 DIAGNOSIS — R569 Unspecified convulsions: Secondary | ICD-10-CM

## 2014-02-01 HISTORY — DX: Unspecified convulsions: R56.9

## 2014-03-08 ENCOUNTER — Telehealth: Payer: Self-pay | Admitting: Internal Medicine

## 2014-03-08 MED ORDER — SERTRALINE HCL 100 MG PO TABS: 200.0000 mg | ORAL_TABLET | Freq: Every day | ORAL | Status: DC

## 2014-03-08 NOTE — Telephone Encounter (Signed)
Pt called stated [facility] on [address] suppose to send request over to [place] for sertraline (ZOLOFT) . Please check, pt really need this refill

## 2014-03-08 NOTE — Telephone Encounter (Signed)
Sent in

## 2014-03-11 ENCOUNTER — Other Ambulatory Visit: Payer: Self-pay | Admitting: Geriatric Medicine

## 2014-03-11 MED ORDER — SERTRALINE HCL 100 MG PO TABS: 200.0000 mg | ORAL_TABLET | Freq: Every day | ORAL | Status: DC

## 2014-03-15 ENCOUNTER — Ambulatory Visit (HOSPITAL_BASED_OUTPATIENT_CLINIC_OR_DEPARTMENT_OTHER): Payer: Self-pay | Admitting: Physical Medicine & Rehabilitation

## 2014-03-15 ENCOUNTER — Encounter: Payer: Medicare Other | Attending: Physical Medicine & Rehabilitation

## 2014-03-15 ENCOUNTER — Encounter: Payer: Self-pay | Admitting: Physical Medicine & Rehabilitation

## 2014-03-15 VITALS — BP 109/79 | HR 73 | Resp 14

## 2014-03-15 DIAGNOSIS — M7501 Adhesive capsulitis of right shoulder: Secondary | ICD-10-CM | POA: Diagnosis present

## 2014-03-15 NOTE — Progress Notes (Signed)
Shoulder injection right With  ultrasound guidance  Indication: Right Shoulder pain not relieved by medication management and other conservative care.  Informed consent was obtained after describing risks and benefits of the procedure with the patient, this includes bleeding, bruising, infection and medication side effects. The patient wishes to proceed and has given written consent. Patient was placed in a seated position. The Right shoulder was marked and prepped with betadine in the subacromial area. A 25-gauge 1-1/2 inch needle was inserted into the subacromial area. After negative draw back for blood, a solution containing 1 mL of 6 mg per ML betamethasone and 4 mL of 1% lidocaine was injected. A band aid was applied. The patient tolerated the procedure well. Post procedure instructions were given.

## 2014-03-15 NOTE — Patient Instructions (Signed)
Joint Injection  Care After  Refer to this sheet in the next few days. These instructions provide you with information on caring for yourself after you have had a joint injection. Your caregiver also may give you more specific instructions. Your treatment has been planned according to current medical practices, but problems sometimes occur. Call your caregiver if you have any problems or questions after your procedure.  After any type of joint injection, it is not uncommon to experience:  · Soreness, swelling, or bruising around the injection site.  · Mild numbness, tingling, or weakness around the injection site caused by the numbing medicine used before or with the injection.  It also is possible to experience the following effects associated with the specific agent after injection:  · Iodine-based contrast agents:  ¨ Allergic reaction (itching, hives, widespread redness, and swelling beyond the injection site).  · Corticosteroids (These effects are rare.):  ¨ Allergic reaction.  ¨ Increased blood sugar levels (If you have diabetes and you notice that your blood sugar levels have increased, notify your caregiver).  ¨ Increased blood pressure levels.  ¨ Mood swings.  · Hyaluronic acid in the use of viscosupplementation.  ¨ Temporary heat or redness.  ¨ Temporary rash and itching.  ¨ Increased fluid accumulation in the injected joint.  These effects all should resolve within a day after your procedure.   HOME CARE INSTRUCTIONS  · Limit yourself to light activity the day of your procedure. Avoid lifting heavy objects, bending, stooping, or twisting.  · Take prescription or over-the-counter pain medication as directed by your caregiver.  · You may apply ice to your injection site to reduce pain and swelling the day of your procedure. Ice may be applied 03-04 times:  ¨ Put ice in a plastic bag.  ¨ Place a towel between your skin and the bag.  ¨ Leave the ice on for no longer than 15-20 minutes each time.  SEEK  IMMEDIATE MEDICAL CARE IF:   · Pain and swelling get worse rather than better or extend beyond the injection site.  · Numbness does not go away.  · Blood or fluid continues to leak from the injection site.  · You have chest pain.  · You have swelling of your face or tongue.  · You have trouble breathing or you become dizzy.  · You develop a fever, chills, or severe tenderness at the injection site that last longer than 1 day.  MAKE SURE YOU:  · Understand these instructions.  · Watch your condition.  · Get help right away if you are not doing well or if you get worse.  Document Released: 10/01/2010 Document Revised: 04/12/2011 Document Reviewed: 10/01/2010  [employer]® Patient Information ©2015 [employer]. This information is not intended to replace advice given to you by your health care provider. Make sure you discuss any questions you have with your health care provider.

## 2014-03-20 ENCOUNTER — Other Ambulatory Visit: Payer: Self-pay | Admitting: Nurse Practitioner

## 2014-04-04 ENCOUNTER — Ambulatory Visit (INDEPENDENT_AMBULATORY_CARE_PROVIDER_SITE_OTHER): Payer: Medicare Other | Admitting: Internal Medicine

## 2014-04-04 ENCOUNTER — Encounter: Payer: Self-pay | Admitting: Internal Medicine

## 2014-04-04 VITALS — BP 112/80 | HR 70 | Temp 97.6°F | Resp 18 | Ht 64.0 in | Wt 150.1 lb

## 2014-04-04 DIAGNOSIS — H60391 Other infective otitis externa, right ear: Secondary | ICD-10-CM

## 2014-04-04 DIAGNOSIS — H60399 Other infective otitis externa, unspecified ear: Secondary | ICD-10-CM | POA: Insufficient documentation

## 2014-04-04 MED ORDER — NEOMYCIN-POLYMYXIN-HC 1 % OT SOLN: 3.0000 [drp] | Freq: Three times a day (TID) | OTIC | Status: DC

## 2014-04-04 MED ORDER — CARBAMIDE PEROXIDE 6.5 % OT SOLN: 5.0000 [drp] | Freq: Two times a day (BID) | OTIC | Status: DC

## 2014-04-04 NOTE — Progress Notes (Signed)
   Subjective:    Patient ID: Anna Lyons, female    DOB: 1955/07/17, 59 y.o.   MRN: [id number]  HPI The patient is a 59 YO female who is coming in for right ear pain and bilateral decreased hearing. It has been present for several months. She has tried cleaning them out with OTC medication without success. She denies tinnitus or recent loud noise exposure. She has never had hearing test. Denies drainage or nasal congestion. Overall, it is getting worse.   Review of Systems  Constitutional: Negative for fever, activity change, appetite change, fatigue and unexpected weight change.  HENT: Positive for ear pain and hearing loss. Negative for congestion, dental problem, ear discharge and tinnitus.   Respiratory: Negative.   Cardiovascular: Negative.   Gastrointestinal: Negative.       Objective:   Physical Exam  Constitutional: She appears well-developed and well-nourished.  HENT:  Head: Normocephalic and atraumatic.  Nose: Nose normal.  Mouth/Throat: Oropharynx is clear and moist.  Left ear clogged with dark, hard wax. Right ear with signs of inflammation and tenderness with motion of the ear.   Eyes: EOM are normal.  Neck: Normal range of motion.  Cardiovascular: Normal rate and regular rhythm.   Pulmonary/Chest: Effort normal and breath sounds normal.   Filed Vitals:   04/04/14 1600  BP: 112/80  Pulse: 70  Temp: 97.6 F (36.4 C)  TempSrc: Oral  Resp: 18  Height: 5\' 4"  (1.626 m)  Weight: 150 lb 1.9 oz (68.094 kg)  SpO2: 94%      Assessment & Plan:

## 2014-04-04 NOTE — Progress Notes (Signed)
Pre visit review using our clinic review tool, if applicable. No additional management support is needed unless otherwise documented below in the visit note. 

## 2014-04-04 NOTE — Assessment & Plan Note (Signed)
Corticosporin drops right ear for 1 week. She will use debrox on left ear and return for ear lavage. If no improvement in symptoms will refer to ENT for evaluation.

## 2014-04-04 NOTE — Patient Instructions (Addendum)
We have sent in the ear drops for the right ear to help with the pain. They are called corticosporin and you put 3 drops in the ear, 3 times a day for 1 week.   We will have you use the debrox to soften up the wax and come back for a nurse visit on Tuesday, Wednesday, or Thursday to get the wax out. We have also sent this to the pharmacy for you to get. If you have some left at home you can use that.   If you are feeling no better with the ears in 2 weeks we will send you to the ear, nose, and throat doctor to make sure we are not missing anything.  Otitis Externa Otitis externa is a germ infection in the outer ear. The outer ear is the area from the eardrum to the outside of the ear. Otitis externa is sometimes called "swimmer's ear." HOME CARE  Put drops in the ear as told by your doctor.  Only take medicine as told by your doctor.  If you have diabetes, your doctor may give you more directions. Follow your doctor's directions.  Keep all doctor visits as told. To avoid another infection:  Keep your ear dry. Use the corner of a towel to dry your ear after swimming or bathing.  Avoid scratching or putting things inside your ear.  Avoid swimming in lakes, dirty water, or pools that use a chemical called chlorine poorly.  You may use ear drops after swimming. Combine equal amounts of white vinegar and alcohol in a bottle. Put 3 or 4 drops in each ear. GET HELP IF:   You have a fever.  Your ear is still red, puffy (swollen), or painful after 3 days.  You still have yellowish-white fluid (pus) coming from the ear after 3 days.  Your redness, puffiness, or pain gets worse.  You have a really bad headache.  You have redness, puffiness, pain, or tenderness behind your ear. MAKE SURE YOU:   Understand these instructions.  Will watch your condition.  Will get help right away if you are not doing well or get worse. Document Released: 07/07/2007 Document Revised: 06/04/2013  Document Reviewed: 02/04/2011 [facility] Patient Information 2015 [place], [place]. This information is not intended to replace advice given to you by your health care provider. Make sure you discuss any questions you have with your health care provider.

## 2014-04-09 ENCOUNTER — Encounter (HOSPITAL_COMMUNITY): Payer: Self-pay | Admitting: Emergency Medicine

## 2014-04-09 ENCOUNTER — Emergency Department (HOSPITAL_COMMUNITY)
Admission: EM | Admit: 2014-04-09 | Discharge: 2014-04-09 | Disposition: A | Discharge status: Home / Self Care | Payer: Medicare Other | Attending: Emergency Medicine | Admitting: Emergency Medicine

## 2014-04-09 ENCOUNTER — Emergency Department (HOSPITAL_COMMUNITY): Payer: Medicare Other

## 2014-04-09 ENCOUNTER — Ambulatory Visit: Payer: Medicare Other | Admitting: Geriatric Medicine

## 2014-04-09 VITALS — BP 130/80

## 2014-04-09 DIAGNOSIS — Z8673 Personal history of transient ischemic attack (TIA), and cerebral infarction without residual deficits: Secondary | ICD-10-CM | POA: Insufficient documentation

## 2014-04-09 DIAGNOSIS — H9203 Otalgia, bilateral: Secondary | ICD-10-CM | POA: Insufficient documentation

## 2014-04-09 DIAGNOSIS — Z79899 Other long term (current) drug therapy: Secondary | ICD-10-CM | POA: Insufficient documentation

## 2014-04-09 DIAGNOSIS — R42 Dizziness and giddiness: Secondary | ICD-10-CM | POA: Diagnosis not present

## 2014-04-09 DIAGNOSIS — Z720 Tobacco use: Secondary | ICD-10-CM | POA: Insufficient documentation

## 2014-04-09 DIAGNOSIS — G40909 Epilepsy, unspecified, not intractable, without status epilepticus: Secondary | ICD-10-CM | POA: Diagnosis not present

## 2014-04-09 DIAGNOSIS — Z7982 Long term (current) use of aspirin: Secondary | ICD-10-CM | POA: Diagnosis not present

## 2014-04-09 DIAGNOSIS — I10 Essential (primary) hypertension: Secondary | ICD-10-CM | POA: Insufficient documentation

## 2014-04-09 DIAGNOSIS — H6122 Impacted cerumen, left ear: Secondary | ICD-10-CM

## 2014-04-09 DIAGNOSIS — Z9889 Other specified postprocedural states: Secondary | ICD-10-CM | POA: Insufficient documentation

## 2014-04-09 LAB — CBC
HCT: 36.7 % (ref 36.0–46.0)
Hemoglobin: 12.4 g/dL (ref 12.0–15.0)
MCH: 30.7 pg (ref 26.0–34.0)
MCHC: 33.8 g/dL (ref 30.0–36.0)
MCV: 90.8 fL (ref 78.0–100.0)
PLATELETS: 134 10*3/uL — AB (ref 150–400)
RBC: 4.04 MIL/uL (ref 3.87–5.11)
RDW: 12.9 % (ref 11.5–15.5)
WBC: 4.3 10*3/uL (ref 4.0–10.5)

## 2014-04-09 LAB — BASIC METABOLIC PANEL
ANION GAP: 6 (ref 5–15)
BUN: 17 mg/dL (ref 6–23)
CHLORIDE: 105 mmol/L (ref 96–112)
CO2: 27 mmol/L (ref 19–32)
Calcium: 8.7 mg/dL (ref 8.4–10.5)
Creatinine, Ser: 0.65 mg/dL (ref 0.50–1.10)
GFR calc Af Amer: >90 mL/min (ref >90)
GFR calc non Af Amer: >90 mL/min (ref >90)
Glucose, Bld: 84 mg/dL (ref 70–99)
POTASSIUM: 3.8 mmol/L (ref 3.5–5.1)
Sodium: 138 mmol/L (ref 135–145)

## 2014-04-09 MED ORDER — GADOBENATE DIMEGLUMINE 529 MG/ML IV SOLN
15.0000 mL | Freq: Once | INTRAVENOUS | Status: AC | PRN
  Administered 2014-04-09: 14 mL via INTRAVENOUS

## 2014-04-09 NOTE — ED Notes (Signed)
Bed: WA08 Expected date:  Expected time:  Means of arrival:  Comments: Ems- orthostatic

## 2014-04-09 NOTE — Progress Notes (Signed)
   Subjective:    Patient ID: Anna Lyons, female    DOB: 1955/07/26, 59 y.o.   MRN: [id number]  HPI Patient came in today for a left ear lavage. She was lightheaded and dizzy when she got here. She tolerated the ear lavage and I removed a large piece of wax. After the ear wash she was still very dizzy. She said she did not feel well at all. She said she didn't have a headache, numbness, or tingling. She denied chest pain and nausea. Her vitals were stable. She had a previous stroke in the past. After Dr. Doug Sou spoke with her, the patient agreed to go to the emergency room.    Review of Systems     Objective:   Physical Exam        Assessment & Plan:

## 2014-04-09 NOTE — ED Provider Notes (Signed)
CSN: [id number]     Arrival date & time 04/09/14  1411 History   First MD Initiated Contact with Patient 04/09/14 1517     Chief Complaint  Patient presents with  . Dizziness     (Consider location/radiation/quality/duration/timing/severity/associated sxs/prior Treatment) HPI  Pt is a 59yo female with hx of depression, HTN, angina, restless leg syndrome, TIA in 2012, CVA in 2013-Left subcortical infarct tx with TPA- residual "walk with limp on right, unable to grasp with right hand," and seizures, presenting to ED from PCP-Dr. Doug Sou at [facility], with concern for pt c/o dizziness and lightheadness. Pt was seen in by her PCP today for a cerumen impaction.  A large piece of cerumen was removed, however, pt was still c/o "not feeling well"  Upon arrival to ED, pt states she is no longer feeling dizzy but states she has had intermittent dizziness, lightheadedness, and left posterior mild headache for the last 3-4 weeks.  Pt denies chest pain, SOB, abdominal pain, nausea, vomiting, fever, or urinary symptoms. Denies numbness or tingling in arms or legs. States she does have residual weakness in her right hand and occasionally gets injections in her shoulder to help with this weakness.  Pt requesting to go home as she has a dog to let outside.  Denies any concerns at this time. Pt is followed a [facility], states so far everything has been going well at those visits.   Past Medical History  Diagnosis Date  . Depression   . Hypertension   . Angina   . Restless leg syndrome   . TIA (transient ischemic attack) 08/2010  . Stroke 04/20/2011    a. 04/20/11 Left subcortical infarct treated w/ TPA;  b. 04/2011 Echo: EF 55-60%;  c. 04/2011 Normal Carotid u/s  d. Residual "walk w/limp on right; unable to grasp w/right hand"  . Tobacco abuse     a. quit @ time of CVA 04/2011.  [name] RLS (restless legs syndrome) 11/16/2011  . Seizures    Past Surgical History  Procedure Laterality Date  . Mastectomy   1980's    bilateral with reconstruction  . Breast reconstruction      bilaterally  . Cesarean section  1979  . Knee arthroscopy  1990's    left; cartilage repair  . Carpal tunnel release  1990's    left  . Left heart catheterization with coronary angiogram N/A 05/20/2011    Procedure: LEFT HEART CATHETERIZATION WITH CORONARY ANGIOGRAM;  Surgeon: Josue Hector, MD;  Location: [facility] CATH LAB;  Service: Cardiovascular;  Laterality: N/A;   Family History  Problem Relation Age of Onset  . Lupus Mother     died early 60's.  . Emphysema Father     died late 14's.  [name] COPD Father    History  Substance Use Topics  . Smoking status: Current Every Day Smoker -- 0.50 packs/day for 37 years    Types: Cigarettes  . Smokeless tobacco: Never Used     Comment: quit 04/20/2011, restarted in May '14  . Alcohol Use: 8.4 oz/week    14 Glasses of wine per week   OB History    No data available     Review of Systems  Constitutional: Negative for fever, chills and fatigue.  HENT: Positive for ear pain ( bilateraly). Negative for congestion and sore throat.   Eyes: Negative for photophobia and visual disturbance.  Respiratory: Negative for cough and shortness of breath.   Cardiovascular: Negative for chest pain and palpitations.  Gastrointestinal: Negative for nausea, vomiting, abdominal pain and diarrhea.  Neurological: Positive for dizziness, weakness ( mild in right hand- chronic (residual from CVA in 2013)), light-headedness and headaches (intermittent, left posterior). Negative for tremors, seizures, syncope, speech difficulty and numbness.  All other systems reviewed and are negative.     Allergies  Codeine  Home Medications   Prior to Admission medications   Medication Sig Start Date End Date Taking? Authorizing Provider  aspirin EC 325 MG tablet Take 325 mg by mouth daily.   Yes Historical Provider, MD  atorvastatin (LIPITOR) 80 MG tablet Take 80 mg by mouth daily.   Yes Historical  Provider, MD  baclofen (LIORESAL) 20 MG tablet TAKE ONE-HALF TABLET BY MOUTH THREE TIMES DAILY FOR  1  WEEK,  THEN  INCREASE  TO  TAKE  ONE  TABLET  BY  MOUTH  THREE  TIMES  DAILY Patient taking differently: TAKE ONE-HALF TABLET BY MOUTH THREE TIMES DAILY 03/20/14  Yes Garvin Fila, MD  carbamide peroxide (DEBROX) 6.5 % otic solution Place 5 drops into the left ear 2 (two) times daily. 04/04/14  Yes Olga Millers, MD  clonazePAM (KLONOPIN) 1 MG tablet Take 1 mg by mouth at bedtime.   Yes Historical Provider, MD  diclofenac sodium (VOLTAREN) 1 % GEL Apply 2 g topically 4 (four) times daily. Patient taking differently: Apply 2 g topically 4 (four) times daily as needed (pain).  12/20/13  Yes Olga Millers, MD  hydrOXYzine (VISTARIL) 25 MG capsule Take 25 mg by mouth daily as needed for anxiety.   Yes Historical Provider, MD  levETIRAcetam (KEPPRA) 500 MG tablet One half tablet in the morning and one full tablet in the evening 12/30/13  Yes Garvin Fila, MD  NEOMYCIN-POLYMYXIN-HYDROCORTISONE (CORTISPORIN) 1 % SOLN otic solution Place 3 drops into the right ear 3 (three) times daily. 04/04/14  Yes Olga Millers, MD  predniSONE (DELTASONE) 20 MG tablet Take 1 tablet (20 mg total) by mouth 2 (two) times daily. 01/22/14  Yes Hendricks Limes, MD  sertraline (ZOLOFT) 100 MG tablet Take 2 tablets (200 mg total) by mouth daily. 03/11/14  Yes Olga Millers, MD  traMADol-acetaminophen (ULTRACET) 37.5-325 MG per tablet Take 1 tablet by mouth every 8 (eight) hours as needed. 11/13/13  Yes Charlett Blake, MD  triamterene-hydrochlorothiazide (MAXZIDE) 75-50 MG per tablet Take 0.5 tablets by mouth daily.   Yes Historical Provider, MD   BP 121/71 mmHg  Pulse 71  Temp(Src) 98.3 F (36.8 C) (Oral)  Resp 18  SpO2 94% Physical Exam  Constitutional: She is oriented to person, place, and time. She appears well-developed and well-nourished. No distress.  HENT:  Head: Normocephalic and atraumatic.   Right Ear: Hearing and external ear normal. No tenderness. No mastoid tenderness. Tympanic membrane is not injected, not perforated, not erythematous, not retracted and not bulging. No middle ear effusion.  Left Ear: Hearing and tympanic membrane normal. There is tenderness. No drainage. No mastoid tenderness. Tympanic membrane is not injected, not perforated, not erythematous, not retracted and not bulging.  No middle ear effusion.  Nose: Nose normal.  Mouth/Throat: Uvula is midline, oropharynx is clear and moist and mucous membranes are normal.  Bilateral ears: erythematous ear canals, Left ear canal-dried flaking skin. No bleeding or discharge. No cerumen impaction  Eyes: Conjunctivae and EOM are normal. Pupils are equal, round, and reactive to light. No scleral icterus.  Neck: Normal range of motion.  Cardiovascular: Normal rate, regular rhythm  and normal heart sounds.   Pulmonary/Chest: Effort normal and breath sounds normal. No respiratory distress. She has no wheezes. She has no rales. She exhibits no tenderness.  Abdominal: Soft. Bowel sounds are normal. She exhibits no distension and no mass. There is no tenderness. There is no rebound and no guarding.  Musculoskeletal: Normal range of motion.  Neurological: She is alert and oriented to person, place, and time. No cranial nerve deficit or sensory deficit. She displays a negative Romberg sign. Coordination and gait normal. GCS eye subscore is 4. GCS verbal subscore is 5. GCS motor subscore is 6.  CN II-XII in tact. Slowed speech and movements but alert to person, place and time. Normal finger to nose coordination. Normal gait. FROM upper and lower extremities bilaterally. 4/5 grip strength in Right hand vs Left hand (residual from CVA in 2013 per pt)  Skin: Skin is warm and dry. She is not diaphoretic.  Nursing note and vitals reviewed.   ED Course  Procedures (including critical care time) Labs Review Labs Reviewed  CBC - Abnormal;  Notable for the following:    Platelets 134 (*)    All other components within normal limits  BASIC METABOLIC PANEL    Imaging Review Mr Kizzie Fantasia Contrast  04/09/2014   CLINICAL DATA:  Dizziness.  Left posterior headache.  Hypertension.  EXAM: MRI HEAD WITHOUT AND WITH CONTRAST  TECHNIQUE: Multiplanar, multiecho pulse sequences of the brain and surrounding structures were obtained without and with intravenous contrast.  CONTRAST:  34mL MULTIHANCE GADOBENATE DIMEGLUMINE 529 MG/ML IV SOLN  COMPARISON:  MRI head 08/16/2013  FINDINGS: Negative for acute infarct.  Chronic lacunar infarctions in the basal ganglia bilaterally are unchanged from the prior MRI. Chronic benign appearing cyst in the right basal ganglia also unchanged. Chronic ischemic changes in the white matter are stable. Brainstem and cerebellum intact  Chronic blood products left basal ganglia related to chronic infarction. This is stable.  Negative for mass or edema.  Normal enhancement following contrast infusion. No enhancing mass lesion.  Paranasal sinuses are clear.  Ventricle size is normal. Cerebral volume is normal for age. Pituitary normal in size.  IMPRESSION: Chronic ischemic change in the basal ganglia and white matter. No acute infarct.   Electronically Signed   By: Franchot Gallo M.D.   On: 04/09/2014 18:02     EKG Interpretation None      MDM   Final diagnoses:  Dizziness  Episodic lightheadedness    Pt is a 59yo female with hx of TIA in 2012 and CVA in 2013, sent to ED by her PCP for c/o dizziness, lightheadedness, "not feeling well" and intermittent left posterior headache for 3-4 weeks.  On exam, pt is alert and oriented. Residual right hand weakness noted on exam.  Pt did report feeling lightheaded upon standing, pt not found to have orthostatic hypotension.  Pt was able to ambulate w/o symptoms, no ataxia noted.   Discussed pt with Dr. Mingo Amber, given pt's hx as well as concern from her PCP, MRI brain ordered.    Labs: unremarkable  MRI brain: chronic ischemic change in basal ganglia and white matter, no acute infarct.  Pt able to ambulate in the hall without assistance. Pt anxious to get home to let her dogs out. Denies any symptoms at time of discharged. Pt safe for discharge home. Home care instructions provided. Advised to f/u with her PCP. Return precautions provided. Pt verbalized understanding and agreement with tx plan.   Discussed pt  with Dr. Mingo Amber who also examined pt, agrees with assessment and tx plan.    Noland Fordyce, PA-C 04/10/14 [place], MD 04/10/14 [phone]

## 2014-04-09 NOTE — ED Notes (Signed)
Per ems pt from [place] , pt was sent to be evaluated for dizziness. Pt was dx with ear infection last week and got treated for it. Pt is alert and oriented x4. Per ems pt is possibly orthostatic. HX stroke, HTN.

## 2014-05-03 ENCOUNTER — Encounter: Payer: Self-pay | Admitting: Internal Medicine

## 2014-05-03 ENCOUNTER — Ambulatory Visit (INDEPENDENT_AMBULATORY_CARE_PROVIDER_SITE_OTHER): Payer: Medicare Other | Admitting: Internal Medicine

## 2014-05-03 VITALS — BP 110/70 | HR 77 | Temp 97.4°F | Resp 18 | Wt 151.2 lb

## 2014-05-03 DIAGNOSIS — F329 Major depressive disorder, single episode, unspecified: Secondary | ICD-10-CM

## 2014-05-03 DIAGNOSIS — F32A Depression, unspecified: Secondary | ICD-10-CM

## 2014-05-03 MED ORDER — FLUOXETINE HCL 20 MG PO TABS: 20.0000 mg | ORAL_TABLET | Freq: Every day | ORAL | Status: DC

## 2014-05-03 NOTE — Progress Notes (Signed)
Pre visit review using our clinic review tool, if applicable. No additional management support is needed unless otherwise documented below in the visit note. 

## 2014-05-03 NOTE — Assessment & Plan Note (Signed)
This is a severe exacerbation and have advised her she would likely benefit from counseling. Her support network is very small and she has almost no one she feels comfortable talking to. She does not wish to go to counselor. Talked to her about SI/HI and she denies. She will stop the zoloft and switch to paxil and see if this helps more. May need to add abilify as well if no improvement in a couple weeks. She knows that this is not a medicine that causes mood changes overnight. She denies any other substance abuse that could be worsening her mood.

## 2014-05-03 NOTE — Patient Instructions (Addendum)
We will have you stop the zoloft. We will start you on a new medicine for the mood called paroxetine. Take 1 pill a day for the first 2-3 weeks then call us with how you are doing. At that time we can increase the dose if needed.   Stress and Stress Management Stress is a normal reaction to life events. It is what you feel when life demands more than you are used to or more than you can handle. Some stress can be useful. For example, the stress reaction can help you catch the last bus of the day, study for a test, or meet a deadline at work. But stress that occurs too often or for too long can cause problems. It can affect your emotional health and interfere with relationships and normal daily activities. Too much stress can weaken your immune system and increase your risk for physical illness. If you already have a medical problem, stress can make it worse. CAUSES  All sorts of life events may cause stress. An event that causes stress for one person may not be stressful for another person. Major life events commonly cause stress. These may be positive or negative. Examples include losing your job, moving into a new home, getting married, having a baby, or losing a loved one. Less obvious life events may also cause stress, especially if they occur day after day or in combination. Examples include working long hours, driving in traffic, caring for children, being in debt, or being in a difficult relationship. SIGNS AND SYMPTOMS Stress may cause emotional symptoms including, the following:  Anxiety. This is feeling worried, afraid, on edge, overwhelmed, or out of control.  Anger. This is feeling irritated or impatient.  Depression. This is feeling sad, down, helpless, or guilty.  Difficulty focusing, remembering, or making decisions. Stress may cause physical symptoms, including the following:   Aches and pains. These may affect your head, neck, back, stomach, or other areas of your body.  Tight  muscles or clenched jaw.  Low energy or trouble sleeping. Stress may cause unhealthy behaviors, including the following:   Eating to feel better (overeating) or skipping meals.  Sleeping too little, too much, or both.  Working too much or putting off tasks (procrastination).  Smoking, drinking alcohol, or using drugs to feel better. DIAGNOSIS  Stress is diagnosed through an assessment by your health care provider. Your health care provider will ask questions about your symptoms and any stressful life events.Your health care provider will also ask about your medical history and may order blood tests or other tests. Certain medical conditions and medicine can cause physical symptoms similar to stress. Mental illness can cause emotional symptoms and unhealthy behaviors similar to stress. Your health care provider may refer you to a mental health professional for further evaluation.  TREATMENT  Stress management is the recommended treatment for stress.The goals of stress management are reducing stressful life events and coping with stress in healthy ways.  Techniques for reducing stressful life events include the following:  Stress identification. Self-monitor for stress and identify what causes stress for you. These skills may help you to avoid some stressful events.  Time management. Set your priorities, keep a calendar of events, and learn to say "no." These tools can help you avoid making too many commitments. Techniques for coping with stress include the following:  Rethinking the problem. Try to think realistically about stressful events rather than ignoring them or overreacting. Try to find the positives in a stressful  situation rather than focusing on the negatives.  Exercise. Physical exercise can release both physical and emotional tension. The key is to find a form of exercise you enjoy and do it regularly.  Relaxation techniques. These relax the body and mind. Examples include  yoga, meditation, tai chi, biofeedback, deep breathing, progressive muscle relaxation, listening to music, being out in nature, journaling, and other hobbies. Again, the key is to find one or more that you enjoy and can do regularly.  Healthy lifestyle. Eat a balanced diet, get plenty of sleep, and do not smoke. Avoid using alcohol or drugs to relax.  Strong support network. Spend time with family, friends, or other people you enjoy being around.Express your feelings and talk things over with someone you trust. Counseling or talktherapy with a mental health professional may be helpful if you are having difficulty managing stress on your own. Medicine is typically not recommended for the treatment of stress.Talk to your health care provider if you think you need medicine for symptoms of stress. HOME CARE INSTRUCTIONS  Keep all follow-up visits as directed by your health care provider.  Take all medicines as directed by your health care provider. SEEK MEDICAL CARE IF:  Your symptoms get worse or you start having new symptoms.  You feel overwhelmed by your problems and can no longer manage them on your own. SEEK IMMEDIATE MEDICAL CARE IF:  You feel like hurting yourself or someone else. Document Released: 07/14/2000 Document Revised: 06/04/2013 Document Reviewed: 09/12/2012 [facility] Patient Information 2015 [place], [place]. This information is not intended to replace advice given to you by your health care provider. Make sure you discuss any questions you have with your health care provider.

## 2014-05-03 NOTE — Progress Notes (Signed)
   Subjective:    Patient ID: Anna Lyons, female    DOB: [birth year], 59 y.o.   MRN: [id number]  HPI The patient is a 59 YO female who is coming in for depression. She has been taking zoloft for a long time and for the last 1-2 months does not think it is working. She has no appetite or interest in things. She denies SI/HI. She has talked to a counselor after her husband passed but she did not think it helped as she did not know the person. She does not wish to go back to counseling. She does not have any activities that she does for fun right now and no exercise. Her symptoms are worsening lately and she now feels they are severe.   Review of Systems  Constitutional: Negative for fever, activity change, appetite change, fatigue and unexpected weight change.  Respiratory: Negative.   Cardiovascular: Negative.   Psychiatric/Behavioral: Positive for sleep disturbance, dysphoric mood, decreased concentration and agitation. Negative for suicidal ideas and self-injury. The patient is nervous/anxious. The patient is not hyperactive.       Objective:   Physical Exam  Constitutional: She appears well-developed and well-nourished.  HENT:  Head: Normocephalic and atraumatic.  Eyes: EOM are normal.  Neck: Normal range of motion.  Cardiovascular: Normal rate and regular rhythm.   Pulmonary/Chest: Effort normal.  Abdominal: Soft.  Skin: Skin is warm and dry.  Psychiatric:  Flat affect   Filed Vitals:   05/03/14 1013  BP: 110/70  Pulse: 77  Temp: 97.4 F (36.3 C)  TempSrc: Oral  Resp: 18  Weight: 151 lb 3.2 oz (68.584 kg)  SpO2: 94%      Assessment & Plan:

## 2014-05-14 ENCOUNTER — Encounter: Payer: Self-pay | Admitting: Neurology

## 2014-05-14 ENCOUNTER — Ambulatory Visit (INDEPENDENT_AMBULATORY_CARE_PROVIDER_SITE_OTHER): Payer: Medicare Other | Admitting: Neurology

## 2014-05-14 VITALS — BP 103/76 | HR 84 | Ht 62.0 in | Wt 151.4 lb

## 2014-05-14 DIAGNOSIS — Z8673 Personal history of transient ischemic attack (TIA), and cerebral infarction without residual deficits: Secondary | ICD-10-CM

## 2014-05-14 NOTE — Progress Notes (Signed)
PATIENT: Anna Lyons DOB: 1956/02/01  REASON FOR VISIT: routine follow up for stroke, seizure HISTORY FROM: patient  HISTORY OF PRESENT ILLNESS: UPDATE 10/19/13 (LL):  Patient returns for stroke followup accompanied by her daughter. She reports that she had stroke like symptoms in July and was admitted to [facility].  Stroke workup was negative and was found to have a UTI. She started back smoking. She is having increased fatigue, stating she feels like sleeping all day. She claims she does not sleep soundly at night and never wakes up feeling refreshed.  Her daughter states she snores loudly. Blood pressure is well controlled, it is 105/66 in the office today.  Dr. Arelia Sneddon is treating her RLS with Klonopin, which she states relieves her symptoms. She is on her last day of Amoxicillin for left ear infection, but states she feels as though she cannot hear; everything sounds muffled. She is tolerating aspirin well with no signs of significant bleeding or bruising.  UPDATE 04/04/13 (LL): Patient comes in for revisit. She has had increased pain in right shoulder and very decreased ROM, and feels like she is dragging her right foot, but denies falls. States she feels like she lays in bed all day due to the pain in her right shoulder, then cannot sleep at night. Denies falls since last visit. BP is well controlled, is 111/71 in office today. Tolerating Keppra and aspirin well without side effects.  UPDATE 12/01/12 (LL): Anna Lyons comes to office for stroke revisit. She reports that she has been having increased trouble with balance in the last couple months, with 3-4 falls in the last month which sent her to the ER. Repeat MRI did not show new stroke. She states she feels like she is going to the side when she walks. She has increased pain in right shoulder and decreased ROM, and feels like she is dragging her right foot sometimes. She is suffering with insomnia, waking frequently through the night, and was  prescribed Clonazepam by her PCP. Her depression is much better since last visit.  UPDATE 08/28/12 (LL): Anna Lyons returns to office for follow up of seizures (2) which occurred on 05/19/12. They occurred while patient was staying with sick husband in hospital, was sleep deprived and stressed out. Husband subsequently died one month ago. Patient has not had anymore seizures, is maintained on Keppra 569m BID. No new neurovascular symptoms. Not sleeping well per daughter, patient has been very depressed since husband's death. Zoloft was increased by PCP. RLS symptoms worse, needs refill on Requip. Dr. EArelia Sneddonmanages HTN and cholesterol, patient does not know last lab values. Daughter reports BP med was recently changed due to hypokalemia. Patient reportedly stating smoking again when husband was terminal with cancer. Plans on quitting asap. Patient c/o right shoulder pain, weak from stroke and exacerbated by moving/carrying boxes.  Anna AUSLEYis a 59y.o. 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 The first happened on the floor of the hospital that she was visiting her husband who was a patient. She is staying with him and was incontinent of both stool and urine and had what was reported to be should shaking activity. She was then taken to the emergency room where she was witnessed to have right head and eye deviation followed by tonic-clonic activity. She was given Ativan and Versed. MRI showed only evidence of  a prior CVA, and EEG was unremarkable. The patient's seizure was likely a combination of a lowered seizure threshold, due to sleep deprivation in the context of visiting her hospitalized husband, and a predisposition to seizures, possibly related to her old CVA. The patient was started on Keppra, with no further seizure activity during the  hospitalization.  02/17/12 PRIOR HPI (PS): 59 year old female was last seen in hospital for stroke or March of 2013. At that time she received TPA. Workup showed normal carotid Dopplers, 2-D echo showed 55-60% EF, negative CT angiogram extracranial vessels, negative CT brain, and positive stroke in the left posterior lentiform nucleus seen on MRI. She was discharged on aspirin 325 mg at that time. She was doing well until 11/16/2011 a.m. She awoke at 8 AM in normal state. She ate breakfast at 10:30 and noted she had difficulty chewing her food. She brushed her teeth at 11 AM and noticed a right facial droop. No other symptoms. Initial CT head showed no acute infarct or bleed. Facial droop was still present. During further exam her facial droop improved and she is now back to her baseline. Initial NIH SS of 2 for right facial droop and pronator drift. CT of the brain 11/16/2011 remote left basal ganglia lacunar infarction. Mild white matter changes as above. No definite CT evidence for acute intracranial abnormality. MRI of the brain Kober 15 2013 chronic left hemisphere infarction as described. Small foci of restricted diffusion in the left periventricular region could represent acute or chronic ischemia. Mild atrophy and mild chronic microvascular ischemic change. Chest x-ray 11/16/2011 cardiomegaly without congestive failure.  Update 05/14/2014 : She returns for follow-up after last visit 6 months ago. She is a complaint bad daughter. She remains in a logical stable without recurrent stroke or TIA symptoms. She states her blood pressure is under good control. Last lipid profile checked several months ago was fine. She remains on aspirin which is tolerating well without bleeding, bruising. She has not had any seizures and she left the hospital. At last visit the [place] dose was reduced and she is tolerating well without breakthrough seizures. She remains on baclofen 20 mg 3 times daily which seems to help her  spasticity and she is tolerating it well. She has noted increase in restless leg symptoms and she takes Klonopin 1 mg at night which seems to help. She has tried Requip in the past which has not work for her. She was recently changed from Zoloft to Prozac for depression and it seems to be helping. She has not had any carotid Dopplers done for nearly a year. She is able to walk with a wheeled walker and has had no recent falls. REVIEW OF SYSTEMS: Full 14 system review of systems performed and notable only for: Ear pain, wheezing, restless legs, depression, headache, easy bruising and bleeding, gait difficulty and all other systems negative   ALLERGIES: Allergies  Allergen Reactions  . Codeine Itching    HOME MEDICATIONS: Outpatient Prescriptions Prior to Visit  Medication Sig Dispense Refill  . aspirin EC 325 MG tablet Take 325 mg by mouth daily.    [name] atorvastatin (LIPITOR) 80 MG tablet Take 80 mg by mouth daily.    . baclofen (LIORESAL) 20 MG tablet TAKE ONE-HALF TABLET BY MOUTH THREE TIMES DAILY FOR  1  WEEK,  THEN  INCREASE  TO  TAKE  ONE  TABLET  BY  MOUTH  THREE  TIMES  DAILY (Patient taking differently: TAKE ONE-HALF TABLET BY  MOUTH THREE TIMES DAILY) 90 tablet 1  . carbamide peroxide (DEBROX) 6.5 % otic solution Place 5 drops into the left ear 2 (two) times daily. (Patient taking differently: Place 5 drops into the left ear 2 (two) times daily as needed. ) 15 mL 0  . clonazePAM (KLONOPIN) 1 MG tablet Take 1 mg by mouth at bedtime.    . diclofenac sodium (VOLTAREN) 1 % GEL Apply 2 g topically 4 (four) times daily. (Patient taking differently: Apply 2 g topically 4 (four) times daily as needed (pain). ) 100 g 3  . FLUoxetine (PROZAC) 20 MG tablet Take 1 tablet (20 mg total) by mouth daily. 30 tablet 3  . hydrOXYzine (VISTARIL) 25 MG capsule Take 25 mg by mouth daily as needed for anxiety.    . levETIRAcetam (KEPPRA) 500 MG tablet One half tablet in the morning and one full tablet in the  evening 135 tablet 1  . NEOMYCIN-POLYMYXIN-HYDROCORTISONE (CORTISPORIN) 1 % SOLN otic solution Place 3 drops into the right ear 3 (three) times daily. 10 mL 0  . traMADol-acetaminophen (ULTRACET) 37.5-325 MG per tablet Take 1 tablet by mouth every 8 (eight) hours as needed. 30 tablet 0  . triamterene-hydrochlorothiazide (MAXZIDE) 75-50 MG per tablet Take 0.5 tablets by mouth daily.     No facility-administered medications prior to visit.    PHYSICAL EXAM Filed Vitals:   05/14/14 1429  BP: 103/76  Pulse: 84  Height: 5\' 2"  (1.575 m)  Weight: 151 lb 6.4 oz (68.675 kg)   Body mass index is 27.68 kg/(m^2).  Generalized: Middle-aged Caucasian female in no acute distress. Neck: Supple, no carotid bruits  Ears: Right external ear canal occluded with hard cerumen. Left TM is red, not bulging. Cardiac: Regular rate rhythm, no murmur  Pulmonary: Clear to auscultation bilaterally  Musculoskeletal: Mild right foot drop   Neurological examination  Mentation: Alert oriented to time, place, history taking, language fluent, with mild dysarthria.  Cranial nerve II-XII: Pupils were equal round reactive to light extraocular movements were full, visual field were full on confrontational test. facial sensation and strength were normal. hearing was intact to finger rubbing bilaterally. Uvula tongue midline. head turning and shoulder shrug and were normal and symmetric.Tongue protrusion into cheek strength was normal.  MOTOR: Increased tone on the right with spastic right hemiparesis. Mild right upper and lower extremity drift with 3/5 strength weakness of RUE and intrinsic hand muscles. Right UE ROM limited by pain. Left orbits right arm.  SENSORY: normal and symmetric to light touch, temperature, vibration  COORDINATION: normal finger-nose-finger, heel-to-shin bilaterally  REFLEXES: increased on right side  GAIT/STATION: Rising up from seated position without assistance, right hemiplegic gait with cane,  unable to perform tiptoe, and heel walking without difficulty. Uses a wheeled walker.   DIAGNOSTIC DATA (LABS, IMAGING, TESTING) - I reviewed patient records, labs, notes, testing and imaging myself where available.  Lab Results  Component Value Date   WBC 4.3 04/09/2014   HGB 12.4 04/09/2014   HCT 36.7 04/09/2014   MCV 90.8 04/09/2014   PLT 134* 04/09/2014      Component Value Date/Time   NA 138 04/09/2014 1446   K 3.8 04/09/2014 1446   CL 105 04/09/2014 1446   CO2 27 04/09/2014 1446   GLUCOSE 84 04/09/2014 1446   BUN 17 04/09/2014 1446   CREATININE 0.65 04/09/2014 1446   CALCIUM 8.7 04/09/2014 1446   PROT 7.5 12/20/2013 1226   ALBUMIN 4.0 12/20/2013 1226   AST 22 12/20/2013  1226   ALT 16 12/20/2013 1226   ALKPHOS 62 12/20/2013 1226   BILITOT 0.9 12/20/2013 1226   GFRNONAA >90 04/09/2014 1446   GFRAA >90 04/09/2014 1446   Lab Results  Component Value Date   CHOL 132 12/20/2013   HDL 46.30 12/20/2013   LDLCALC 58 12/20/2013   TRIG 139.0 12/20/2013   CHOLHDL 3 12/20/2013   Lab Results  Component Value Date   HGBA1C 5.8 12/20/2013    ASSESSMENT: Anna Lyons is a 59 y.o. 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   PLAN:  I had a long d/w patient and daughter about her remote stroke, risk for recurrent stroke/TIAs, personally independently reviewed imaging studies and stroke evaluation results and answered questions.Continue aspirin 325 mg orally every day  for secondary stroke prevention and maintain strict control of hypertension with blood pressure goal below 130/90, diabetes with hemoglobin A1c goal below 6.5% and lipids with LDL cholesterol goal below 100 mg/dL. I also advised the patient to reduce Keppra dose to 250 mg twice daily as she has not had any further seizures now for a couple of years. Continue baclofen for spasticity  and increase Klonopin dose to 1-1/2 tablets at night to help with restless leg syndrome. Check follow-up screening carotid ultrasound study. Patient will be given a handicap parking sticker. I have again counseled her to quit smoking but she refuses to do so. Return for follow-up in 6 months with Vaughan Browner, nurse practitioner or call earlier if necessary.    Orders Placed This Encounter  Procedures  . US Carotid Bilateral   Antony Contras, MD  05/14/2014, 4:04 PM [facility] [address] [place], [place] [address] [phone]  Note: This document was prepared with digital dictation and possible smart phrase technology. Any transcriptional errors that result from this process are unintentional.

## 2014-05-14 NOTE — Patient Instructions (Signed)
I had a long d/w patient and daughter about her remote stroke, risk for recurrent stroke/TIAs, personally independently reviewed imaging studies and stroke evaluation results and answered questions.Continue aspirin 325 mg orally every day  for secondary stroke prevention and maintain strict control of hypertension with blood pressure goal below 130/90, diabetes with hemoglobin A1c goal below 6.5% and lipids with LDL cholesterol goal below 100 mg/dL. I also advised the patient to reduce Keppra dose to 250 mg twice daily as she has not had any further seizures now for a couple of years. Continue baclofen for spasticity and increase Klonopin dose to 1-1/2 tablets at night to help with restless leg syndrome. Check follow-up screening carotid ultrasound study. Patient will be given a handicap parking sticker. I have again counseled her to quit smoking but she refuses to do so. Return for follow-up in 6 months with Vaughan Browner, nurse practitioner or call earlier if necessary.

## 2014-05-15 ENCOUNTER — Ambulatory Visit (INDEPENDENT_AMBULATORY_CARE_PROVIDER_SITE_OTHER): Payer: Medicare Other | Admitting: *Deleted

## 2014-05-15 DIAGNOSIS — Z23 Encounter for immunization: Secondary | ICD-10-CM

## 2014-05-15 DIAGNOSIS — Z111 Encounter for screening for respiratory tuberculosis: Secondary | ICD-10-CM | POA: Diagnosis not present

## 2014-05-17 LAB — TB SKIN TEST
Induration: 0 mm
TB Skin Test: NEGATIVE

## 2014-05-22 ENCOUNTER — Other Ambulatory Visit: Payer: Self-pay | Admitting: Internal Medicine

## 2014-05-22 MED ORDER — ATORVASTATIN CALCIUM 80 MG PO TABS: 80.0000 mg | ORAL_TABLET | Freq: Every day | ORAL | Status: DC

## 2014-05-22 NOTE — Telephone Encounter (Signed)
Sent atorvastatin pls advise on clonazepam.../lmb

## 2014-05-22 NOTE — Telephone Encounter (Signed)
Pt called in an is request refill on her   Clonazepam  Atorvastatin   [facility] [address] [facility] number [phone]

## 2014-05-23 MED ORDER — CLONAZEPAM 1 MG PO TABS: 1.0000 mg | ORAL_TABLET | Freq: Every day | ORAL | Status: DC

## 2014-05-23 NOTE — Telephone Encounter (Signed)
Printed and signed, please fax.  

## 2014-05-23 NOTE — Telephone Encounter (Signed)
Faxed script on clonazepam to [facility]...[name]

## 2014-05-24 ENCOUNTER — Ambulatory Visit (INDEPENDENT_AMBULATORY_CARE_PROVIDER_SITE_OTHER): Payer: Medicare Other

## 2014-05-24 VITALS — BP 106/64 | HR 84 | Wt 143.8 lb

## 2014-05-24 DIAGNOSIS — Z0000 Encounter for general adult medical examination without abnormal findings: Secondary | ICD-10-CM

## 2014-05-24 NOTE — Progress Notes (Deleted)
   Subjective:    Patient ID: Anna Lyons, female    DOB: February 19, 1955, 59 y.o.   MRN: [id number]  HPI    Review of Systems     Objective:   Physical Exam        Assessment & Plan:

## 2014-05-24 NOTE — Patient Instructions (Addendum)
Anna Lyons , Thank you for taking time to come for your Medicare Wellness Visit. I appreciate your ongoing commitment to your health goals. Please review the following plan we discussed and let me know if I can assist you in the future.   These are the goals we discussed: Goals    . Attending meditation / other stress management classes     Plan  1. You will go talk to a counselor regarding grief; loss and lonliness 2. Nurse will fup on resources; for household; food; [employer] silver sneakers; Senior center or social resources       This is a list of the screening recommended for you and due dates:  Health Maintenance  Topic Date Due  . HIV Screening  02/01/1969  . Pap Smear  02/02/1972  . Colon Cancer Screening  02/02/2004  . Mammogram  11/15/2011  . Flu Shot  09/02/2014  . Tetanus Vaccine  12/21/2023  . Shingles Vaccine  Completed   Plan Patient has agreed for referral at [place] and she will call and make an appointment once Dr. Doug Sou reviews Also will send out community resources for household assistance and social resources; [employer]; Senior center Dr Doug Sou to evaluated need for Pap Colonoscopy was completed 2013 Dr. Doug Sou evaluated for sleep study which was ordered in the past and not followed up        Given patient information on smoking cessation; osteo; sleep apneaDepression Depression refers to feeling sad, low, down in the dumps, blue, gloomy, or empty. In general, there are two kinds of depression:  Normal sadness or normal grief. This kind of depression is one that we all feel from time to time after upsetting life experiences, such as the loss of a job or the ending of a relationship. This kind of depression is considered normal, is short lived, and resolves within a few days to 2 weeks. Depression experienced after the loss of a loved one (bereavement) often lasts longer than 2 weeks but normally gets better with time.  Clinical depression. This  kind of depression lasts longer than normal sadness or normal grief or interferes with your ability to function at home, at work, and in school. It also interferes with your personal relationships. It affects almost every aspect of your life. Clinical depression is an illness. Symptoms of depression can also be caused by conditions other than those mentioned above, such as:  Physical illness. Some physical illnesses, including underactive thyroid gland (hypothyroidism), severe anemia, specific types of cancer, diabetes, uncontrolled seizures, heart and lung problems, strokes, and chronic pain are commonly associated with symptoms of depression.  Side effects of some prescription medicine. In some people, certain types of medicine can cause symptoms of depression.  Substance abuse. Abuse of alcohol and illicit drugs can cause symptoms of depression. SYMPTOMS Symptoms of normal sadness and normal grief include the following:  Feeling sad or crying for short periods of time.  Not caring about anything (apathy).  Difficulty sleeping or sleeping too much.  No longer able to enjoy the things you used to enjoy.  Desire to be by oneself all the time (social isolation).  Lack of energy or motivation.  Difficulty concentrating or remembering.  Change in appetite or weight.  Restlessness or agitation. Symptoms of clinical depression include the same symptoms of normal sadness or normal grief and also the following symptoms:  Feeling sad or crying all the time.  Feelings of guilt or worthlessness.  Feelings of hopelessness or helplessness.  Thoughts of suicide or the desire to harm yourself (suicidal ideation).  Loss of touch with reality (psychotic symptoms). Seeing or hearing things that are not real (hallucinations) or having false beliefs about your life or the people around you (delusions and paranoia). DIAGNOSIS  The diagnosis of clinical depression is usually based on how bad the  symptoms are and how long they have lasted. Your health care provider will also ask you questions about your medical history and substance use to find out if physical illness, use of prescription medicine, or substance abuse is causing your depression. Your health care provider may also order blood tests. TREATMENT  Often, normal sadness and normal grief do not require treatment. However, sometimes antidepressant medicine is given for bereavement to ease the depressive symptoms until they resolve. The treatment for clinical depression depends on how bad the symptoms are but often includes antidepressant medicine, counseling with a mental health professional, or both. Your health care provider will help to determine what treatment is best for you. Depression caused by physical illness usually goes away with appropriate medical treatment of the illness. If prescription medicine is causing depression, talk with your health care provider about stopping the medicine, decreasing the dose, or changing to another medicine. Depression caused by the abuse of alcohol or illicit drugs goes away when you stop using these substances. Some adults need professional help in order to stop drinking or using drugs. SEEK IMMEDIATE MEDICAL CARE IF:  You have thoughts about hurting yourself or others.  You lose touch with reality (have psychotic symptoms).  You are taking medicine for depression and have a serious side effect. FOR MORE INFORMATION  National [employer] on Mental Illness: www.nami.[employer]: https://carter.com/ Document Released: 01/16/2000 Document Revised: 06/04/2013 Document Reviewed: 04/19/2011 [facility] Patient Information 2015 [place], [place]. This information is not intended to replace advice given to you by your health care provider. Make sure you discuss any questions you have with your health care provider. Stress Stress-related medical problems are becoming  increasingly common. The body has a built-in physical response to stressful situations. Faced with pressure, challenge or danger, we need to react quickly. Our bodies release hormones such as cortisol and adrenaline to help do this. These hormones are part of the "fight or flight" response and affect the metabolic rate, heart rate and blood pressure, resulting in a heightened, stressed state that prepares the body for optimum performance in dealing with a stressful situation. It is likely that early man required these mechanisms to stay alive, but usually modern stresses do not call for this, and the same hormones released in today's world can damage health and reduce coping ability. CAUSES Pressure to perform at work, at school or in sports. Threats of physical violence. Money worries. Arguments. Family conflicts. Divorce or separation from significant other. Bereavement. New job or unemployment. Changes in location. Alcohol or drug abuse. SOMETIMES, THERE IS NO PARTICULAR REASON FOR DEVELOPING STRESS. Almost all people are at risk of being stressed at some time in their lives. It is important to know that some stress is temporary and some is long term. Temporary stress will go away when a situation is resolved. Most people can cope with short periods of stress, and it can often be relieved by relaxing, taking a walk or getting any type of exercise, chatting through issues with friends, or having a good night's sleep. Chronic (long-term, continuous) stress is much harder to deal with. It can be psychologically and emotionally  damaging. It can be harmful both for an individual and for friends and family. SYMPTOMS Everyone reacts to stress differently. There are some common effects that help [place] recognize it. In times of extreme stress, people may: Shake uncontrollably. Breathe faster and deeper than normal (hyperventilate). Vomit. For people with asthma, stress can trigger an attack. For some  people, stress may trigger migraine headaches, ulcers, and body pain. PHYSICAL EFFECTS OF STRESS MAY INCLUDE: Loss of energy. Skin problems. Aches and pains resulting from tense muscles, including neck ache, backache and tension headaches. Increased pain from arthritis and other conditions. Irregular heart beat (palpitations). Periods of irritability or anger. Apathy or depression. Anxiety (feeling uptight or worrying). Unusual behavior. Loss of appetite. Comfort eating. Lack of concentration. Loss of, or decreased, sex-drive. Increased smoking, drinking, or recreational drug use. For women, missed periods. Ulcers, joint pain, and muscle pain. Post-traumatic stress is the stress caused by any serious accident, strong emotional damage, or extremely difficult or violent experience such as rape or war. Post-traumatic stress victims can experience mixtures of emotions such as fear, shame, depression, guilt or anger. It may include recurrent memories or images that may be haunting. These feelings can last for weeks, months or even years after the traumatic event that triggered them. Specialized treatment, possibly with medicines and psychological therapies, is available. If stress is causing physical symptoms, severe distress or making it difficult for you to function as normal, it is worth seeing your caregiver. It is important to remember that although stress is a usual part of life, extreme or prolonged stress can lead to other illnesses that will need treatment. It is better to visit a doctor sooner rather than later. Stress has been linked to the development of high blood pressure and heart disease, as well as insomnia and depression. There is no diagnostic test for stress since everyone reacts to it differently. But a caregiver will be able to spot the physical symptoms, such as: Headaches. Shingles. Ulcers. Emotional distress such as intense worry, low mood or irritability should be  detected when the doctor asks pertinent questions to identify any underlying problems that might be the cause. In case there are physical reasons for the symptoms, the doctor may also want to do some tests to exclude certain conditions. If you feel that you are suffering from stress, try to identify the aspects of your life that are causing it. Sometimes you may not be able to change or avoid them, but even a small change can have a positive ripple effect. A simple lifestyle change can make all the difference. STRATEGIES THAT CAN HELP DEAL WITH STRESS: Delegating or sharing responsibilities. Avoiding confrontations. Learning to be more assertive. Regular exercise. Avoid using alcohol or street drugs to cope. Eating a healthy, balanced diet, rich in fruit and vegetables and proteins. Finding humor or absurdity in stressful situations. Never taking on more than you know you can handle comfortably. Organizing your time better to get as much done as possible. Talking to friends or family and sharing your thoughts and fears. Listening to music or relaxation tapes. Relaxation techniques like deep breathing, meditation, and yoga. Tensing and then relaxing your muscles, starting at the toes and working up to the head and neck. If you think that you would benefit from help, either in identifying the things that are causing your stress or in learning techniques to help you relax, see a caregiver who is capable of helping you with this. Rather than relying on medications, it is  usually better to try and identify the things in your life that are causing stress and try to deal with them. There are many techniques of managing stress including counseling, psychotherapy, aromatherapy, yoga, and exercise. Your caregiver can help you determine what is best for you. Document Released: 04/10/2002 Document Revised: 01/23/2013 Document Reviewed: 03/07/2007 [facility] Patient Information 2015 [place], [place]. This  information is not intended to replace advice given to you by your health care provider. Make sure you discuss any questions you have with your health care provider. Sleep Apnea  Sleep apnea is a sleep disorder characterized by abnormal pauses in breathing while you sleep. When your breathing pauses, the level of oxygen in your blood decreases. This causes you to move out of deep sleep and into light sleep. As a result, your quality of sleep is poor, and the system that carries your blood throughout your body (cardiovascular system) experiences stress. If sleep apnea remains untreated, the following conditions can develop:  High blood pressure (hypertension).  Coronary artery disease.  Inability to achieve or maintain an erection (impotence).  Impairment of your thought process (cognitive dysfunction). There are three types of sleep apnea:  Obstructive sleep apnea--Pauses in breathing during sleep because of a blocked airway.  Central sleep apnea--Pauses in breathing during sleep because the area of the brain that controls your breathing does not send the correct signals to the muscles that control breathing.  Mixed sleep apnea--A combination of both obstructive and central sleep apnea. RISK FACTORS The following risk factors can increase your risk of developing sleep apnea:  Being overweight.  Smoking.  Having narrow passages in your nose and throat.  Being of older age.  Being female.  Alcohol use.  Sedative and tranquilizer use.  Ethnicity. Among individuals younger than 35 years, African Americans are at increased risk of sleep apnea. SYMPTOMS   Difficulty staying asleep.  Daytime sleepiness and fatigue.  Loss of energy.  Irritability.  Loud, heavy snoring.  Morning headaches.  Trouble concentrating.  Forgetfulness.  Decreased interest in sex. DIAGNOSIS  In order to diagnose sleep apnea, your caregiver will perform a physical examination. Your caregiver may  suggest that you take a home sleep test. Your caregiver may also recommend that you spend the night in a sleep lab. In the sleep lab, several monitors record information about your heart, lungs, and brain while you sleep. Your leg and arm movements and blood oxygen level are also recorded. TREATMENT The following actions may help to resolve mild sleep apnea:  Sleeping on your side.   Using a decongestant if you have nasal congestion.   Avoiding the use of depressants, including alcohol, sedatives, and narcotics.   Losing weight and modifying your diet if you are overweight. There also are devices and treatments to help open your airway:  Oral appliances. These are custom-made mouthpieces that shift your lower jaw forward and slightly open your bite. This opens your airway.  Devices that create positive airway pressure. This positive pressure "splints" your airway open to help you breathe better during sleep. The following devices create positive airway pressure:  Continuous positive airway pressure (CPAP) device. The CPAP device creates a continuous level of air pressure with an air pump. The air is delivered to your airway through a mask while you sleep. This continuous pressure keeps your airway open.  Nasal expiratory positive airway pressure (EPAP) device. The EPAP device creates positive air pressure as you exhale. The device consists of single-use valves, which are inserted into  each nostril and held in place by adhesive. The valves create very little resistance when you inhale but create much more resistance when you exhale. That increased resistance creates the positive airway pressure. This positive pressure while you exhale keeps your airway open, making it easier to breath when you inhale again.  Bilevel positive airway pressure (BPAP) device. The BPAP device is used mainly in patients with central sleep apnea. This device is similar to the CPAP device because it also uses an air  pump to deliver continuous air pressure through a mask. However, with the BPAP machine, the pressure is set at two different levels. The pressure when you exhale is lower than the pressure when you inhale.  Surgery. Typically, surgery is only done if you cannot comply with less invasive treatments or if the less invasive treatments do not improve your condition. Surgery involves removing excess tissue in your airway to create a wider passage way. Document Released: 01/08/2002 Document Revised: 05/15/2012 Document Reviewed: 05/27/2011 [facility] Patient Information 2015 [place], [place]. This information is not intended to replace advice given to you by your health care provider. Make sure you discuss any questions you have with your health care provider.    Bone Densitometry Bone densitometry is a special X-ray that measures your bone density and can be used to help predict your risk of bone fractures. This test is used to determine bone mineral content and density to diagnose osteoporosis. Osteoporosis is the loss of bone that may cause the bone to become weak. Osteoporosis commonly occurs in women entering menopause. However, it may be found in men and in people with other diseases. PREPARATION FOR TEST No preparation necessary. WHO SHOULD BE TESTED?  All women older than 61.  Postmenopausal women (50 to 43) with risk factors for osteoporosis.  People with a previous fracture caused by normal activities.  People with a small body frame (less than 127 poundsor a body mass index [BMI] of less than 21).  People who have a parent with a hip fracture or history of osteoporosis.  People who smoke.  People who have rheumatoid arthritis.  Anyone who engages in excessive alcohol use (more than 3 drinks most days).  Women who experience early menopause. WHEN SHOULD YOU BE RETESTED? Current guidelines suggest that you should wait at least 2 years before doing a bone density test again if your  first test was normal.Recent studies indicated that women with normal bone density may be able to wait a few years before needing to repeat a bone density test. You should discuss this with your caregiver.  NORMAL FINDINGS   Normal: less than standard deviation below normal (greater than -1).  Osteopenia: 1 to 2.5 standard deviations below normal (-1 to -2.5).  Osteoporosis: greater than 2.5 standard deviations below normal (less than -2.5). Test results are reported as a "T score" and a "Z score."The T score is a number that compares your bone density with the bone density of healthy, young women.The Z score is a number that compares your bone density with the scores of women who are the same age, gender, and race.  Ranges for normal findings may vary among different laboratories and hospitals. You should always check with your doctor after having lab work or other tests done to discuss the meaning of your test results and whether your values are considered within normal limits. MEANING OF TEST  Your caregiver will go over the test results with you and discuss the importance and meaning of  your results, as well as treatment options and the need for additional tests if necessary. OBTAINING THE TEST RESULTS It is your responsibility to obtain your test results. Ask the lab or department performing the test when and how you will get your results. Document Released: 02/10/2004 Document Revised: 04/12/2011 Document Reviewed: 03/04/2010 [facility] Patient Information 2015 [place], [place]. This information is not intended to replace advice given to you by your health care provider. Make sure you discuss any questions you have with your health care provider.  Fall Prevention and Home Safety Falls cause injuries and can affect all age groups. It is possible to prevent falls.  HOW TO PREVENT FALLS  Wear shoes with rubber soles that do not have an opening for your toes.  Keep the inside and outside of  your house well lit.  Use night lights throughout your home.  Remove clutter from floors.  Clean up floor spills.  Remove throw rugs or fasten them to the floor with carpet tape.  Do not place electrical cords across pathways.  Put grab bars by your tub, shower, and toilet. Do not use towel bars as grab bars.  Put handrails on both sides of the stairway. Fix loose handrails.  Do not climb on stools or stepladders, if possible.  Do not wax your floors.  Repair uneven or unsafe sidewalks, walkways, or stairs.  Keep items you use a lot within reach.  Be aware of pets.  Keep emergency numbers next to the telephone.  Put smoke detectors in your home and near bedrooms. Ask your doctor what other things you can do to prevent falls. Document Released: 11/14/2008 Document Revised: 07/20/2011 Document Reviewed: 04/20/2011 [facility] Patient Information 2015 [place], [place]. This information is not intended to replace advice given to you by your health care provider. Make sure you discuss any questions you have with your health care provider.  Health Maintenance Adopting a healthy lifestyle and getting preventive care can go a long way to promote health and wellness. Talk with your health care provider about what schedule of regular examinations is right for you. This is a good chance for you to check in with your provider about disease prevention and staying healthy. In between checkups, there are plenty of things you can do on your own. Experts have done a lot of research about which lifestyle changes and preventive measures are most likely to keep you healthy. Ask your health care provider for more information. WEIGHT AND DIET  Eat a healthy diet  Be sure to include plenty of vegetables, fruits, low-fat dairy products, and lean protein.  Do not eat a lot of foods high in solid fats, added sugars, or salt.  Get regular exercise. This is one of the most important things you can do for  your health.  Most adults should exercise for at least 150 minutes each week. The exercise should increase your heart rate and make you sweat (moderate-intensity exercise).  Most adults should also do strengthening exercises at least twice a week. This is in addition to the moderate-intensity exercise.  Maintain a healthy weight  Body mass index (BMI) is a measurement that can be used to identify possible weight problems. It estimates body fat based on height and weight. Your health care provider can help determine your BMI and help you achieve or maintain a healthy weight.  For females 64 years of age and older:   A BMI below 18.5 is considered underweight.  A BMI of 18.5 to 24.9 is  normal.  A BMI of 25 to 29.9 is considered overweight.  A BMI of 30 and above is considered obese.  Watch levels of cholesterol and blood lipids  You should start having your blood tested for lipids and cholesterol at 60 years of age, then have this test every 5 years.  You may need to have your cholesterol levels checked more often if:  Your lipid or cholesterol levels are high.  You are older than 60 years of age.  You are at high risk for heart disease.  CANCER SCREENING   Lung Cancer  Lung cancer screening is recommended for adults 73-59 years old who are at high risk for lung cancer because of a history of smoking.  A yearly low-dose CT scan of the lungs is recommended for people who:  Currently smoke.  Have quit within the past 15 years.  Have at least a 30-pack-year history of smoking. A pack year is smoking an average of one pack of cigarettes a day for 1 year.  Yearly screening should continue until it has been 15 years since you quit.  Yearly screening should stop if you develop a health problem that would prevent you from having lung cancer treatment.  Breast Cancer  Practice breast self-awareness. This means understanding how your breasts normally appear and feel.  It  also means doing regular breast self-exams. Let your health care provider know about any changes, no matter how small.  If you are in your 20s or 30s, you should have a clinical breast exam (CBE) by a health care provider every 1-3 years as part of a regular health exam.  If you are 43 or older, have a CBE every year. Also consider having a breast X-ray (mammogram) every year.  If you have a family history of breast cancer, talk to your health care provider about genetic screening.  If you are at high risk for breast cancer, talk to your health care provider about having an MRI and a mammogram every year.  Breast cancer gene (BRCA) assessment is recommended for women who have family members with BRCA-related cancers. BRCA-related cancers include:  Breast.  Ovarian.  Tubal.  Peritoneal cancers.  Results of the assessment will determine the need for genetic counseling and BRCA1 and BRCA2 testing. Cervical Cancer Routine pelvic examinations to screen for cervical cancer are no longer recommended for nonpregnant women who are considered low risk for cancer of the pelvic organs (ovaries, uterus, and vagina) and who do not have symptoms. A pelvic examination may be necessary if you have symptoms including those associated with pelvic infections. Ask your health care provider if a screening pelvic exam is right for you.   The Pap test is the screening test for cervical cancer for women who are considered at risk.  If you had a hysterectomy for a problem that was not cancer or a condition that could lead to cancer, then you no longer need Pap tests.  If you are older than 65 years, and you have had normal Pap tests for the past 10 years, you no longer need to have Pap tests.  If you have had past treatment for cervical cancer or a condition that could lead to cancer, you need Pap tests and screening for cancer for at least 20 years after your treatment.  If you no longer get a Pap test,  assess your risk factors if they change (such as having a new sexual partner). This can affect whether you should start being  screened again.  Some women have medical problems that increase their chance of getting cervical cancer. If this is the case for you, your health care provider may recommend more frequent screening and Pap tests.  The human papillomavirus (HPV) test is another test that may be used for cervical cancer screening. The HPV test looks for the virus that can cause cell changes in the cervix. The cells collected during the Pap test can be tested for HPV.  The HPV test can be used to screen women 7 years of age and older. Getting tested for HPV can extend the interval between normal Pap tests from three to five years.  An HPV test also should be used to screen women of any age who have unclear Pap test results.  After 60 years of age, women should have HPV testing as often as Pap tests.  Colorectal Cancer  This type of cancer can be detected and often prevented.  Routine colorectal cancer screening usually begins at 60 years of age and continues through 60 years of age.  Your health care provider may recommend screening at an earlier age if you have risk factors for colon cancer.  Your health care provider may also recommend using home test kits to check for hidden blood in the stool.  A small camera at the end of a tube can be used to examine your colon directly (sigmoidoscopy or colonoscopy). This is done to check for the earliest forms of colorectal cancer.  Routine screening usually begins at age 66.  Direct examination of the colon should be repeated every 5-10 years through 60 years of age. However, you may need to be screened more often if early forms of precancerous polyps or small growths are found. Skin Cancer  Check your skin from head to toe regularly.  Tell your health care provider about any new moles or changes in moles, especially if there is a change  in a mole's shape or color.  Also tell your health care provider if you have a mole that is larger than the size of a pencil eraser.  Always use sunscreen. Apply sunscreen liberally and repeatedly throughout the day.  Protect yourself by wearing long sleeves, pants, a wide-brimmed hat, and sunglasses whenever you are outside. HEART DISEASE, DIABETES, AND HIGH BLOOD PRESSURE   Have your blood pressure checked at least every 1-2 years. High blood pressure causes heart disease and increases the risk of stroke.  If you are between 62 years and 2 years old, ask your health care provider if you should take aspirin to prevent strokes.  Have regular diabetes screenings. This involves taking a blood sample to check your fasting blood sugar level.  If you are at a normal weight and have a low risk for diabetes, have this test once every three years after 60 years of age.  If you are overweight and have a high risk for diabetes, consider being tested at a younger age or more often. PREVENTING INFECTION  Hepatitis B  If you have a higher risk for hepatitis B, you should be screened for this virus. You are considered at high risk for hepatitis B if:  You were born in a country where hepatitis B is common. Ask your health care provider which countries are considered high risk.  Your parents were born in a high-risk country, and you have not been immunized against hepatitis B (hepatitis B vaccine).  You have HIV or AIDS.  You use needles to inject street drugs.  You live with someone who has hepatitis B.  You have had sex with someone who has hepatitis B.  You get hemodialysis treatment.  You take certain medicines for conditions, including cancer, organ transplantation, and autoimmune conditions. Hepatitis C  Blood testing is recommended for:  Everyone born from [birth year] through 1966.  Anyone with known risk factors for hepatitis C. Sexually transmitted infections (STIs)  You should be  screened for sexually transmitted infections (STIs) including gonorrhea and chlamydia if:  You are sexually active and are younger than 60 years of age.  You are older than 60 years of age and your health care provider tells you that you are at risk for this type of infection.  Your sexual activity has changed since you were last screened and you are at an increased risk for chlamydia or gonorrhea. Ask your health care provider if you are at risk.  If you do not have HIV, but are at risk, it may be recommended that you take a prescription medicine daily to prevent HIV infection. This is called pre-exposure prophylaxis (PrEP). You are considered at risk if:  You are sexually active and do not regularly use condoms or know the HIV status of your partner(s).  You take drugs by injection.  You are sexually active with a partner who has HIV. Talk with your health care provider about whether you are at high risk of being infected with HIV. If you choose to begin PrEP, you should first be tested for HIV. You should then be tested every 3 months for as long as you are taking PrEP.  PREGNANCY   If you are premenopausal and you may become pregnant, ask your health care provider about preconception counseling.  If you may become pregnant, take 400 to 800 micrograms (mcg) of folic acid every day.  If you want to prevent pregnancy, talk to your health care provider about birth control (contraception). OSTEOPOROSIS AND MENOPAUSE   Osteoporosis is a disease in which the bones lose minerals and strength with aging. This can result in serious bone fractures. Your risk for osteoporosis can be identified using a bone density scan.  If you are 91 years of age or older, or if you are at risk for osteoporosis and fractures, ask your health care provider if you should be screened.  Ask your health care provider whether you should take a calcium or vitamin D supplement to lower your risk for  osteoporosis.  Menopause may have certain physical symptoms and risks.  Hormone replacement therapy may reduce some of these symptoms and risks. Talk to your health care provider about whether hormone replacement therapy is right for you.  HOME CARE INSTRUCTIONS   Schedule regular health, dental, and eye exams.  Stay current with your immunizations.   Do not use any tobacco products including cigarettes, chewing tobacco, or electronic cigarettes.  If you are pregnant, do not drink alcohol.  If you are breastfeeding, limit how much and how often you drink alcohol.  Limit alcohol intake to no more than 1 drink per day for nonpregnant women. One drink equals 12 ounces of beer, 5 ounces of wine, or 1 ounces of hard liquor.  Do not use street drugs.  Do not share needles.  Ask your health care provider for help if you need support or information about quitting drugs.  Tell your health care provider if you often feel depressed.  Tell your health care provider if you have ever been abused or do not feel  safe at home. Document Released: 08/03/2010 Document Revised: 06/04/2013 Document Reviewed: 12/20/2012 [facility] Patient Information 2015 [place], [place]. This information is not intended to replace advice given to you by your health care provider. Make sure you discuss any questions you have with your health care provider.  Smoking Cessation Quitting smoking is important to your health and has many advantages. However, it is not always easy to quit since nicotine is a very addictive drug. Oftentimes, people try 3 times or more before being able to quit. This document explains the best ways for you to prepare to quit smoking. Quitting takes hard work and a lot of effort, but you can do it. ADVANTAGES OF QUITTING SMOKING  You will live longer, feel better, and live better.  Your body will feel the impact of quitting smoking almost immediately.  Within 20 minutes, blood pressure  decreases. Your pulse returns to its normal level.  After 8 hours, carbon monoxide levels in the blood return to normal. Your oxygen level increases.  After 24 hours, the chance of having a heart attack starts to decrease. Your breath, hair, and body stop smelling like smoke.  After 48 hours, damaged nerve endings begin to recover. Your sense of taste and smell improve.  After 72 hours, the body is virtually free of nicotine. Your bronchial tubes relax and breathing becomes easier.  After 2 to 12 weeks, lungs can hold more air. Exercise becomes easier and circulation improves.  The risk of having a heart attack, stroke, cancer, or lung disease is greatly reduced.  After 1 year, the risk of coronary heart disease is cut in half.  After 5 years, the risk of stroke falls to the same as a nonsmoker.  After 10 years, the risk of lung cancer is cut in half and the risk of other cancers decreases significantly.  After 15 years, the risk of coronary heart disease drops, usually to the level of a nonsmoker.  If you are pregnant, quitting smoking will improve your chances of having a healthy baby.  The people you live with, especially any children, will be healthier.  You will have extra money to spend on things other than cigarettes. QUESTIONS TO THINK ABOUT BEFORE ATTEMPTING TO QUIT You may want to talk about your answers with your health care provider.  Why do you want to quit?  If you tried to quit in the past, what helped and what did not?  What will be the most difficult situations for you after you quit? How will you plan to handle them?  Who can help you through the tough times? Your family? Friends? A health care provider?  What pleasures do you get from smoking? What ways can you still get pleasure if you quit? Here are some questions to ask your health care provider:  How can you help me to be successful at quitting?  What medicine do you think would be best for me and how  should I take it?  What should I do if I need more help?  What is smoking withdrawal like? How can I get information on withdrawal? GET READY  Set a quit date.  Change your environment by getting rid of all cigarettes, ashtrays, matches, and lighters in your home, car, or work. Do not let people smoke in your home.  Review your past attempts to quit. Think about what worked and what did not. GET SUPPORT AND ENCOURAGEMENT You have a better chance of being successful if you have help. You  can get support in many ways.  Tell your family, friends, and coworkers that you are going to quit and need their support. Ask them not to smoke around you.  Get individual, group, or telephone counseling and support. Programs are available at [employer] and health centers. Call your local health department for information about programs in your area.  Spiritual beliefs and practices may help some smokers quit.  Download a "quit meter" on your computer to keep track of quit statistics, such as how long you have gone without smoking, cigarettes not smoked, and money saved.  Get a self-help book about quitting smoking and staying off tobacco. [place] yourself from urges to smoke. Talk to someone, go for a walk, or occupy your time with a task.  Change your normal routine. Take a different route to work. Drink tea instead of coffee. Eat breakfast in a different place.  Reduce your stress. Take a hot bath, exercise, or read a book.  Plan something enjoyable to do every day. Reward yourself for not smoking.  Explore interactive web-based programs that specialize in helping you quit. GET MEDICINE AND USE IT CORRECTLY Medicines can help you stop smoking and decrease the urge to smoke. Combining medicine with the above behavioral methods and support can greatly increase your chances of successfully quitting smoking.  Nicotine replacement therapy helps deliver  nicotine to your body without the negative effects and risks of smoking. Nicotine replacement therapy includes nicotine gum, lozenges, inhalers, nasal sprays, and skin patches. Some may be available over-the-counter and others require a prescription.  Antidepressant medicine helps people abstain from smoking, but how this works is unknown. This medicine is available by prescription.  Nicotinic receptor partial agonist medicine simulates the effect of nicotine in your brain. This medicine is available by prescription. Ask your health care provider for advice about which medicines to use and how to use them based on your health history. Your health care provider will tell you what side effects to look out for if you choose to be on a medicine or therapy. Carefully read the information on the package. Do not use any other product containing nicotine while using a nicotine replacement product.  RELAPSE OR DIFFICULT SITUATIONS Most relapses occur within the first 3 months after quitting. Do not be discouraged if you start smoking again. Remember, most people try several times before finally quitting. You may have symptoms of withdrawal because your body is used to nicotine. You may crave cigarettes, be irritable, feel very hungry, cough often, get headaches, or have difficulty concentrating. The withdrawal symptoms are only temporary. They are strongest when you first quit, but they will go away within 10-14 days. To reduce the chances of relapse, try to:  Avoid drinking alcohol. Drinking lowers your chances of successfully quitting.  Reduce the amount of caffeine you consume. Once you quit smoking, the amount of caffeine in your body increases and can give you symptoms, such as a rapid heartbeat, sweating, and anxiety.  Avoid smokers because they can make you want to smoke.  Do not let weight gain distract you. Many smokers will gain weight when they quit, usually less than 10 pounds. Eat a healthy diet  and stay active. You can always lose the weight gained after you quit.  Find ways to improve your mood other than smoking. FOR MORE INFORMATION  www.smokefree.gov  Document Released: 01/12/2001 Document Revised: 06/04/2013 Document Reviewed: 04/29/2011 [facility] Patient Information 2015 [place], [place]. This information  is not intended to replace advice given to you by your health care provider. Make sure you discuss any questions you have with your health care provider.

## 2014-05-24 NOTE — Progress Notes (Signed)
Subjective:    Patient ID: Anna Lyons, female    DOB: 09-29-1955, 59 y.o.   MRN: [id number]  HPI    Review of Systems Problem list was reviewed     Objective:   Physical Exam deferred        Assessment & Plan:     Subjective:   Anna Lyons is a 59 y.o. female who presents for Medicare Annual (Subsequent) preventive examination.  Review of Systems: no changes in problem list Health better, the same or worse than last year?  Main issue; Describes health as fair; continues to be tearful, verbalizes decreased mood  Vision: has glasses; does not wear them all the time;   Dental: Has upper dentures; has dry mouth and take biotene;  Finances limited  New doctors; Current Care Team reviewed and updated  Neurology:  Suzzanne [facility]  Gerrit Friends   Cardiac Risk Factors include: advanced age (>11men, >31 women);dyslipidemia;hypertension   No cardiac issue voiced today but does discuss stress in family and grief issues     Objective:     Vitals: BP 106/64 mmHg  Pulse 84  Wt 143 lb 12 oz (65.205 kg)  SpO2 98%  Tobacco History  Smoking status  . Current Every Day Smoker -- 0.50 packs/day for 37 years  . Types: Cigarettes  Smokeless tobacco  . Never Used    Comment: quit 04/20/2011, restarted in May '14     Ready to quit: Not Answered Counseling given: Not Answered Given information on smoking cessation, but due to stress and ongoing "depression" does not want to stop right now.    Past Medical History  Diagnosis Date  . Depression   . Hypertension   . Angina   . Restless leg syndrome   . TIA (transient ischemic attack) 08/2010  . Stroke 04/20/2011    a. 04/20/11 Left subcortical infarct treated w/ TPA;  b. 04/2011 Echo: EF 55-60%;  c. 04/2011 Normal Carotid u/s  d. Residual "walk w/limp on right; unable to grasp w/right hand"  . Tobacco abuse     a. quit @ time of CVA 04/2011.  [name] RLS (restless legs syndrome) 11/16/2011  . Seizures     Past Surgical History  Procedure Laterality Date  . Mastectomy  1980's    bilateral with reconstruction  . Breast reconstruction      bilaterally  . Cesarean section  1979  . Knee arthroscopy  1990's    left; cartilage repair  . Carpal tunnel release  1990's    left  . Left heart catheterization with coronary angiogram N/A 05/20/2011    Procedure: LEFT HEART CATHETERIZATION WITH CORONARY ANGIOGRAM;  Surgeon: Josue Hector, MD;  [facility] CATH LAB;  Service: Cardiovascular;  Laterality: N/A;   Family History  Problem Relation Age of Onset  . Lupus Mother     died early 26's.  . Emphysema Father     died late 34's.  [name] COPD Father    History  Sexual Activity  . Sexual Activity: No    Outpatient Encounter Prescriptions as of 05/24/2014  Medication Sig  . aspirin EC 325 MG tablet Take 325 mg by mouth daily.  [name] atorvastatin (LIPITOR) 80 MG tablet Take 1 tablet (80 mg total) by mouth daily.  . baclofen (LIORESAL) 20 MG tablet TAKE ONE-HALF TABLET BY MOUTH THREE TIMES DAILY FOR  1  WEEK,  THEN  INCREASE  TO  TAKE  ONE  TABLET  BY  MOUTH  THREE  TIMES  DAILY (Patient taking differently: TAKE ONE-HALF TABLET BY MOUTH THREE TIMES DAILY)  . carbamide peroxide (DEBROX) 6.5 % otic solution Place 5 drops into the left ear 2 (two) times daily. (Patient taking differently: Place 5 drops into the left ear 2 (two) times daily as needed. )  . clonazePAM (KLONOPIN) 1 MG tablet Take 1 tablet (1 mg total) by mouth at bedtime.  . diclofenac sodium (VOLTAREN) 1 % GEL Apply 2 g topically 4 (four) times daily. (Patient taking differently: Apply 2 g topically 4 (four) times daily as needed (pain). )  . FLUoxetine (PROZAC) 20 MG tablet Take 1 tablet (20 mg total) by mouth daily.  . hydrOXYzine (VISTARIL) 25 MG capsule Take 25 mg by mouth daily as needed for anxiety.  . levETIRAcetam (KEPPRA) 500 MG tablet One half tablet in the morning and one full tablet in the evening  . Multiple Vitamin  (MULTIVITAMIN) capsule Take 1 capsule by mouth.  . traMADol-acetaminophen (ULTRACET) 37.5-325 MG per tablet Take 1 tablet by mouth every 8 (eight) hours as needed.  . triamterene-hydrochlorothiazide (MAXZIDE) 75-50 MG per tablet Take 0.5 tablets by mouth daily.  . [DISCONTINUED] atorvastatin (LIPITOR) 80 MG tablet Take 80 mg by mouth daily.  . [DISCONTINUED] clonazePAM (KLONOPIN) 1 MG tablet Take 1 mg by mouth at bedtime.  . [DISCONTINUED] NEOMYCIN-POLYMYXIN-HYDROCORTISONE (CORTISPORIN) 1 % SOLN otic solution Place 3 drops into the right ear 3 (three) times daily. (Patient not taking: Reported on 05/24/2014)    Activities of Daily Living In your present state of health, do you have any difficulty performing the following activities: 05/24/2014 05/24/2014  Hearing? N N  Vision? N -  Difficulty concentrating or making decisions? Y -  Walking or climbing stairs? Y -  Dressing or bathing? N -  Doing errands, shopping? N -  Preparing Food and eating ? N -  Using the Toilet? N -  In the past six months, have you accidently leaked urine? N -  Do you have problems with loss of bowel control? N -  Managing your Medications? N -  Managing your Finances? Y -  Housekeeping or managing your Housekeeping? N -    Patient Care Team: Olga Millers, MD as PCP - General (Internal Medicine) Charlett Blake, MD as Consulting Physician (Physical Medicine and Rehabilitation)    Assessment:    CC risk: obesity (BMI); HTN; hyperlipidemia; Sedentary;   smoking hx; stress; quite a bit of stress with independent living and grief issues; verbalizes loneliness and social isolation; 2 friends are now ill; minimal support system. Crying off and on during the interview.  CC risk osteo: discussed smoking placed her at risk;   Risk for hepatitis or high risk social behavior: Reviewed risk with her and educated  Risk for Depression: Depression scale significant; Denies feeling suicidal and states she loves  her grandson and "he needs her".   Risk for Falls: Recently rec'd a walker with seat and is adjusting well. States her right side seem to be somewhat weaker and therefore the walker was ordered.  Safety assessed (driving issues; vision; home; environment; support)   Patient was concerned regarding her loss of memory; states she is overwhelmed by finances but no failure at paying bills; Folstein competed and score was 30.  Current Exercise Walks dog every day for about 30 minutes  Current dietary; States she eats one meal a day. Will fup on options; Senior center meals or other.  Need for Immunizations or other  screenings identified; Pap needed; bil mastectomy States she had colonoscopy in 2013 but does not know where.  Health Maintenance up to date and a Preventive Wellness Plan discussed with the patient.  Exercise Activities and Dietary recommendations Current Exercise Habits:: Home exercise routine, Type of exercise: walking, Time (Minutes): 30, Frequency (Times/Week): > 6, Weekly Exercise (Minutes/Week): 0, Intensity: Moderate  Goals    . Attending meditation / other stress management classes     Plan  1. You will go talk to a counselor regarding grief; loss and lonliness 2. Nurse will fup on resources; for household; food; [employer] silver sneakers; Senior center or social resources      Fall Risk Fall Risk  05/24/2014  Falls in the past year? Yes  Number falls in past yr: 1  Injury with Fall? No  Risk for fall due to : Impaired mobility   Depression Screen PHQ 2/9 Scores 05/24/2014  PHQ - 2 Score 5  PHQ- 9 Score 14  Exception Documentation Patient refusal    Patient agrees to counseling; Awaiting referral from Dr. Doug Sou Cognitive Testing MMSE - Mini Mental State Exam 05/24/2014  Orientation to time 5  Orientation to Place 5  Registration 3  Attention/ Calculation 5  Recall 3  Language- name 2 objects 2  Language- repeat 1  Language- follow 3 step command 3  Language-  read & follow direction 1  Write a sentence 1  Copy design 1  Total score 30    Immunization History  Administered Date(s) Administered  . Influenza Split 11/17/2011  . Influenza Whole 11/01/2013  . PPD Test 05/15/2014  . Pneumococcal Polysaccharide-23 11/17/2011  . Tdap 12/20/2013  . Zoster 12/13/2013   Screening Tests Health Maintenance  Topic Date Due  . HIV Screening  02/01/1969  . PAP SMEAR  02/02/1972  . COLONOSCOPY  02/02/2004  . MAMMOGRAM  11/15/2011  . INFLUENZA VACCINE  09/02/2014  . TETANUS/TDAP  12/21/2023  . ZOSTAVAX  Completed     No risk determined for hepatitis; HIV at this time Pap smear to be completed Mammogram attempted due to pain in right upper chest; otherwise mastectomy bil Plan:   During the course of the visit the patient was educated and counseled about the following appropriate screening and preventive services:   Vaccines to include Pneumoccal, Influenza, Hepatitis B, Td, Zostavax, HCV/ completed education  Electrocardiogram-deferred completed in 04/10/2014  Cardiovascular Disease; reviewed  Colorectal cancer screening-states it was completed in 2013  Bone density screening/ not done/ to discuss risk with Dr. Doug Sou  Diabetes screening: 5.8   Glaucoma screening; TBS  Mammography/PAP: PAP to be evaluated or completed by Dr. Fransico Setters for sleep study but did not fup; to discuss with Dr. Doug Sou  Nutrition counseling: deferred; will get resources as community pantry to assist with meeting needs;  Identified social isolation with loss of spouse and 2 friends recently; States she is "SHY" and has limited Education officer, community. Identifies feeling alone; Agrees to go to [place] for counseling session to assist her with transitions at this time. (Dr. Doug Sou to order and then will let the patient know via my chart and give her number.  Stress reduction information given  Crying off and on; stated medication not working but did  agree to counseling. Mood lifted at the end of the assessment. Will contact insurance to see if she has a [employer] benefit; look for food pantry; (has applied to DSS) ; will review for social resources; looking for part time  work or Psychologist, occupational and will attempt to identify community assistance.   Patient Instructions (the written plan) was given to the patient. (osteo, smoking cessation, apnea, patient preventive health.   Wynetta Fines, RN  05/24/2014

## 2014-05-28 ENCOUNTER — Telehealth: Payer: Self-pay

## 2014-05-28 NOTE — Telephone Encounter (Signed)
Call to the patient with social resources; Anna Lyons as well as web site [email or web address].org, where she can get resources for food assistance etc. Also will send out information on activities in the community.  Referred again to [facility] and alerted mbr to check her insurance card for co-pays and she will do so Also informed mbr to call the insurance company if her copay is to high and ask for recommendation mental health referral in the plan. May offer telephonic assistance; short term.  The patient will call me for add'l needs.

## 2014-05-29 ENCOUNTER — Telehealth: Payer: Self-pay

## 2014-05-29 NOTE — Progress Notes (Signed)
I have reviewed and agree with the assessment done by Wynetta Fines. She does have some positive for depression and was appropriately directed to counseling which has been recommended in the past.

## 2014-05-29 NOTE — Telephone Encounter (Signed)
Call to [facility] / spoke with Levada Dy in member services States there is no coverage for sliver sneakers, but they do cover a new program called "Blue 365" which gives gym discounts as well as discounts on other items; Call [phone]  Also regarding mental health services; Laurina Bustle will auth at [phone] and the patient does not have a copay for in network benefits but has a 40.00 copay for oon practice

## 2014-06-24 ENCOUNTER — Other Ambulatory Visit: Payer: Self-pay | Admitting: Geriatric Medicine

## 2014-06-24 ENCOUNTER — Other Ambulatory Visit: Payer: Self-pay | Admitting: Neurology

## 2014-06-24 ENCOUNTER — Telehealth: Payer: Self-pay | Admitting: Internal Medicine

## 2014-06-24 MED ORDER — TRIAMTERENE-HCTZ 75-50 MG PO TABS: 0.5000 | ORAL_TABLET | Freq: Every day | ORAL | Status: DC

## 2014-06-24 NOTE — Telephone Encounter (Signed)
Is requesting refill on triamterene to be sent to [facility] on [address]

## 2014-06-24 NOTE — Telephone Encounter (Signed)
Sent to pharmacy 

## 2014-07-10 ENCOUNTER — Other Ambulatory Visit: Payer: Self-pay | Admitting: *Deleted

## 2014-07-10 ENCOUNTER — Encounter: Payer: Self-pay | Admitting: Internal Medicine

## 2014-07-10 ENCOUNTER — Ambulatory Visit (INDEPENDENT_AMBULATORY_CARE_PROVIDER_SITE_OTHER): Payer: Medicare Other | Admitting: Internal Medicine

## 2014-07-10 VITALS — BP 94/58 | HR 86 | Temp 98.3°F | Resp 18 | Ht 63.0 in | Wt 149.8 lb

## 2014-07-10 DIAGNOSIS — F32A Depression, unspecified: Secondary | ICD-10-CM

## 2014-07-10 DIAGNOSIS — F329 Major depressive disorder, single episode, unspecified: Secondary | ICD-10-CM

## 2014-07-10 MED ORDER — TRAMADOL-ACETAMINOPHEN 37.5-325 MG PO TABS: 1.0000 | ORAL_TABLET | Freq: Three times a day (TID) | ORAL | Status: DC | PRN

## 2014-07-10 MED ORDER — FLUOXETINE HCL 20 MG PO TABS: 60.0000 mg | ORAL_TABLET | Freq: Every day | ORAL | Status: DC

## 2014-07-10 NOTE — Telephone Encounter (Signed)
Left VM for pt to make an appt. In order to receive future refills per [facility]

## 2014-07-10 NOTE — Patient Instructions (Signed)
We will have you increase to taking 2 pills of the prozac (fluoxetine) a day. This will be 40 mg daily (20 mg pills) for 1 week. After 1 week you can increase to taking 3 pills a day (60 mg).   For 1 week take 2 pills a day then increase to taking 3 pills a day. Wait 1-2 weeks after that and call back if you are not feeling like you are improving. If you have side effects call back sooner.   If you are feeling no better in 3 weeks then we need to change to a different one that hopefully will work better.

## 2014-07-10 NOTE — Progress Notes (Signed)
Pre visit review using our clinic review tool, if applicable. No additional management support is needed unless otherwise documented below in the visit note. 

## 2014-07-12 NOTE — Progress Notes (Signed)
   Subjective:    Patient ID: Anna Lyons, female    DOB: November 20, 1955, 59 y.o.   MRN: [id number]  HPI The patient is a 59 YO female who is coming in for her depression. She started taking the medicine prozac 20 mg daily and has not noticed much difference. She has little interest in activities and socializing with others. Denies SI/HI. Sleeping okay but more than usual. Feels sad and does not feel she can open up well to her family. She will be starting some counseling soon.   Review of Systems  Constitutional: Negative for fever, activity change, appetite change, fatigue and unexpected weight change.  Respiratory: Negative.   Cardiovascular: Negative.   Psychiatric/Behavioral: Positive for sleep disturbance, dysphoric mood, decreased concentration and agitation. Negative for suicidal ideas and self-injury. The patient is nervous/anxious. The patient is not hyperactive.       Objective:   Physical Exam  Constitutional: She appears well-developed and well-nourished.  HENT:  Head: Normocephalic and atraumatic.  Eyes: EOM are normal.  Neck: Normal range of motion.  Cardiovascular: Normal rate and regular rhythm.   Pulmonary/Chest: Effort normal.  Abdominal: Soft.  Skin: Skin is warm and dry.  Psychiatric:  Flat affect   Filed Vitals:   07/10/14 1035  BP: 94/58  Pulse: 86  Temp: 98.3 F (36.8 C)  TempSrc: Oral  Resp: 18  Height: 5\' 3"  (1.6 m)  Weight: 149 lb 12.8 oz (67.949 kg)  SpO2: 92%      Assessment & Plan:

## 2014-07-12 NOTE — Assessment & Plan Note (Signed)
Will increase prozac to 40 mg daily for 1 week then increase to 60 mg daily if tolerating. After 2 weeks of 60 mg if no improvement will switch agents. I think counseling will be beneficial as it will give her a safe outlet (since she does not like to burden her family). No SI/HI. Good support network.

## 2014-08-28 ENCOUNTER — Other Ambulatory Visit (INDEPENDENT_AMBULATORY_CARE_PROVIDER_SITE_OTHER): Payer: Medicare Other

## 2014-08-28 ENCOUNTER — Ambulatory Visit (INDEPENDENT_AMBULATORY_CARE_PROVIDER_SITE_OTHER): Payer: Medicare Other | Admitting: Internal Medicine

## 2014-08-28 ENCOUNTER — Encounter: Payer: Self-pay | Admitting: Internal Medicine

## 2014-08-28 VITALS — BP 122/80 | HR 68 | Temp 97.8°F | Resp 16 | Wt 146.0 lb

## 2014-08-28 DIAGNOSIS — I951 Orthostatic hypotension: Secondary | ICD-10-CM

## 2014-08-28 DIAGNOSIS — R1013 Epigastric pain: Secondary | ICD-10-CM

## 2014-08-28 DIAGNOSIS — Z720 Tobacco use: Secondary | ICD-10-CM | POA: Diagnosis not present

## 2014-08-28 DIAGNOSIS — F17200 Nicotine dependence, unspecified, uncomplicated: Secondary | ICD-10-CM

## 2014-08-28 DIAGNOSIS — R112 Nausea with vomiting, unspecified: Secondary | ICD-10-CM

## 2014-08-28 LAB — CBC WITH DIFFERENTIAL/PLATELET
Basophils Absolute: 0 10*3/uL (ref 0.0–0.1)
Basophils Relative: 0.2 % (ref 0.0–3.0)
EOS PCT: 1.1 % (ref 0.0–5.0)
Eosinophils Absolute: 0.1 10*3/uL (ref 0.0–0.7)
HCT: 39.3 % (ref 36.0–46.0)
Hemoglobin: 13.4 g/dL (ref 12.0–15.0)
LYMPHS ABS: 1.7 10*3/uL (ref 0.7–4.0)
LYMPHS PCT: 29.2 % (ref 12.0–46.0)
MCHC: 34 g/dL (ref 30.0–36.0)
MCV: 92.6 fl (ref 78.0–100.0)
MONO ABS: 0.4 10*3/uL (ref 0.1–1.0)
Monocytes Relative: 6.9 % (ref 3.0–12.0)
Neutro Abs: 3.7 10*3/uL (ref 1.4–7.7)
Neutrophils Relative %: 62.6 % (ref 43.0–77.0)
PLATELETS: 208 10*3/uL (ref 150.0–400.0)
RBC: 4.25 Mil/uL (ref 3.87–5.11)
RDW: 13.9 % (ref 11.5–15.5)
WBC: 5.9 10*3/uL (ref 4.0–10.5)

## 2014-08-28 LAB — BASIC METABOLIC PANEL
BUN: 10 mg/dL (ref 6–23)
CALCIUM: 9.7 mg/dL (ref 8.4–10.5)
CO2: 33 meq/L — AB (ref 19–32)
Chloride: 97 mEq/L (ref 96–112)
Creatinine, Ser: 0.72 mg/dL (ref 0.40–1.20)
GFR: 87.65 mL/min (ref >60.00)
Glucose, Bld: 86 mg/dL (ref 70–99)
POTASSIUM: 3.9 meq/L (ref 3.5–5.1)
Sodium: 136 mEq/L (ref 135–145)

## 2014-08-28 LAB — HEPATIC FUNCTION PANEL
ALT: 14 U/L (ref 0–35)
AST: 21 U/L (ref 0–37)
Albumin: 4.2 g/dL (ref 3.5–5.2)
Alkaline Phosphatase: 76 U/L (ref 39–117)
Bilirubin, Direct: 0.1 mg/dL (ref 0.0–0.3)
TOTAL PROTEIN: 7.6 g/dL (ref 6.0–8.3)
Total Bilirubin: 0.7 mg/dL (ref 0.2–1.2)

## 2014-08-28 LAB — AMYLASE: Amylase: 34 U/L (ref 27–131)

## 2014-08-28 LAB — LIPASE: LIPASE: 27 U/L (ref 11.0–59.0)

## 2014-08-28 MED ORDER — RANITIDINE HCL 150 MG PO TABS: 150.0000 mg | ORAL_TABLET | Freq: Two times a day (BID) | ORAL | Status: DC

## 2014-08-28 MED ORDER — ONDANSETRON HCL 4 MG PO TABS: 4.0000 mg | ORAL_TABLET | Freq: Three times a day (TID) | ORAL | Status: DC | PRN

## 2014-08-28 NOTE — Patient Instructions (Addendum)
Reflux of gastric acid may be asymptomatic as this may occur mainly during sleep.The triggers for reflux  include stress; the "aspirin family" ; alcohol; peppermint; and caffeine (coffee, tea, cola, and chocolate). The aspirin family would include aspirin and the nonsteroidal agents such as ibuprofen &  Naproxen. Tylenol would not cause reflux. If having symptoms ; food & drink should be avoided for @ least 2 hours before going to bed.    Your next office appointment will be determined based upon review of your pending labs  and  xrays  Those written interpretation of the lab results and instructions will be transmitted to you by My Chart OR by mail for your records.  Critical results will be called.   Followup as needed for any active or acute issue. Please report any significant change in your symptoms.

## 2014-08-28 NOTE — Progress Notes (Signed)
   Subjective:    Patient ID: Anna Lyons, female    DOB: 06/16/1955, 59 y.o.   MRN: [id number]  HPI Sunday morning 08/25/14 she woke up with nausea. That afternoon she vomited once. She's been fatigued and weak since. She did think she was improved as of last night but today she developed nausea and epigastric pain along with persistent weakness and fatigue. The epigastric pain is described as constant up to level VIII on a 10 scale and sharp. It started on Monday or Tuesday of this week, 7/25 or 7/26. She has been taking Advil 2 pills twice a day for neck pain for over a month. She states she's only smoked roughly 3 packs of cigarettes the last 6 weeks. She does drink a daily cup of coffee. She drinks 48 oz of tea daily.Alcohol intake is occasional. She's under stress as her husbands died. She is seeing a Retail banker.  She describes some dark, black stool. She describes lightheadedness when arising from being seated to standing.  She states she's had a colonoscopy but there is no record of such in the chart. She states she cannot give [place] a date as she has memory issues since her stroke. She's never had an endoscopy.  The problem lists non-STEMI in April 2014. Daughter was unaware of such.   Review of Systems She is denying chest pain, palpitations, claudication, or edema.  She's had no rectal bleeding or hematemesis.  She describes numbness in her feet, especially the toes.    Objective:   Physical Exam Pertinent or positive findings include: There is a strong odor of tobacco when one enters the exam room. She has ptosis of the right eye. She has an upper plate. She has few remaining lower teeth. Breath sounds are decreased. Heart rate is slow. There is accentuation of the second heart sound with slight slurring. She's tender in epigastrium.  Supine blood pressure was 130/82 with pulse of 64. Sitting blood pressure 118/72 with pulse of 64. Standing blood pressure was 94/64 with pulse 73.  After 3 minutes of standing blood pressure was 130/80.  Hemoccult testing was negative.  General appearance :adequately nourished; in no distress.  Eyes: No conjunctival inflammation or scleral icterus is present.  Oral exam:  Lips and gums are healthy appearing.There is no oropharyngeal erythema or exudate noted.   Heart:  Normal rate and regular rhythm. S1 normal without gallop, murmur, click, rub or other extra sounds    Lungs:Chest clear to auscultation; no wheezes, rhonchi,rales ,or rubs present.No increased work of breathing.   Abdomen: bowel sounds normal, soft  without masses, organomegaly or hernias noted.  No guarding or rebound. No flank tenderness to percussion.  Vascular : all pulses equal ; no bruits present.  Skin:Warm & dry.  Intact without suspicious lesions or rashes ; no tenting or jaundice   Lymphatic: No lymphadenopathy is noted about the head, neck, axilla.   Neuro: Strength, tone decreased         Assessment & Plan:  #1 epigastric pain associated with nausea and vomiting  #2 possible melena  #3 postural hypotension  Plan: See orders and recommendations

## 2014-08-28 NOTE — Progress Notes (Signed)
Pre visit review using our clinic review tool, if applicable. No additional management support is needed unless otherwise documented below in the visit note. 

## 2014-09-06 ENCOUNTER — Encounter: Payer: Self-pay | Admitting: *Deleted

## 2014-09-16 ENCOUNTER — Other Ambulatory Visit: Payer: Self-pay

## 2014-09-17 MED ORDER — CLONAZEPAM 1 MG PO TABS: 1.0000 mg | ORAL_TABLET | Freq: Every day | ORAL | Status: DC

## 2014-10-08 ENCOUNTER — Encounter: Payer: Self-pay | Admitting: Internal Medicine

## 2014-10-08 ENCOUNTER — Ambulatory Visit (INDEPENDENT_AMBULATORY_CARE_PROVIDER_SITE_OTHER): Payer: Medicare Other | Admitting: Internal Medicine

## 2014-10-08 VITALS — BP 150/90 | HR 66 | Temp 98.5°F | Resp 16 | Ht 64.0 in | Wt 146.1 lb

## 2014-10-08 DIAGNOSIS — I10 Essential (primary) hypertension: Secondary | ICD-10-CM | POA: Diagnosis not present

## 2014-10-08 DIAGNOSIS — R110 Nausea: Secondary | ICD-10-CM | POA: Diagnosis not present

## 2014-10-08 DIAGNOSIS — F32A Depression, unspecified: Secondary | ICD-10-CM

## 2014-10-08 DIAGNOSIS — F329 Major depressive disorder, single episode, unspecified: Secondary | ICD-10-CM | POA: Diagnosis not present

## 2014-10-08 MED ORDER — DULOXETINE HCL 30 MG PO CPEP: 30.0000 mg | ORAL_CAPSULE | Freq: Every day | ORAL | Status: DC

## 2014-10-08 NOTE — Patient Instructions (Signed)
We will have you take 2 pills of the prozac tomorrow (Wednesday), then 1 pill of the prozac on Thursday. Friday and Saturday do not take any prozac. Monday we will have you start a different medicine called cymbalta which should help more with the sadness and not have the side effects. That is one pill a day. We start you off on the low dose for the first 2 weeks, then if you are doing well and no side effects call us and we can increase to the full dose.   Come back in 1-2 months to see how you are doing.

## 2014-10-08 NOTE — Progress Notes (Signed)
Pre visit review using our clinic review tool, if applicable. No additional management support is needed unless otherwise documented below in the visit note. 

## 2014-10-09 DIAGNOSIS — R110 Nausea: Secondary | ICD-10-CM | POA: Insufficient documentation

## 2014-10-09 NOTE — Assessment & Plan Note (Addendum)
Suspect medication side effect and will stop her prozac to see if this has helped. Location suspicious to the stomach but she has failed any benefit from ranitidine making pure GERD less likely.

## 2014-10-09 NOTE — Assessment & Plan Note (Signed)
BP mildly elevated today due to stomach symptoms. Prior normal and will continue hctz for now. If recheck still mildly elevated change regimen.

## 2014-10-09 NOTE — Progress Notes (Signed)
   Subjective:    Patient ID: Anna Lyons, female    DOB: [birth year], 59 y.o.   MRN: [id number]  HPI The patient is a 59 YO female coming in for follow up of medicine dose change. We increased her prozac at the last visit as it was not helping enough with her mood. Since that time she has been having lots of stomach discomfort and nausea. Some dry heaving and not much vomiting. No blood in her stools. Some pain as well in her stomach. Was tried on ranitidine and that did not help much. Has zofran but that doesn't help much at all. Her mood is not improved even on the higher dose of prozac.   Review of Systems  Constitutional: Positive for activity change and appetite change. Negative for fever, fatigue and unexpected weight change.  Respiratory: Negative.   Cardiovascular: Negative.   Gastrointestinal: Positive for nausea, vomiting and abdominal pain. Negative for diarrhea, constipation, blood in stool and abdominal distention.  Psychiatric/Behavioral: Positive for sleep disturbance, dysphoric mood, decreased concentration and agitation. Negative for suicidal ideas and self-injury. The patient is nervous/anxious. The patient is not hyperactive.       Objective:   Physical Exam  Constitutional: She is oriented to person, place, and time. She appears well-developed and well-nourished.  Mild distress  HENT:  Head: Normocephalic and atraumatic.  Eyes: EOM are normal.  Neck: Normal range of motion.  Cardiovascular: Normal rate and regular rhythm.   Pulmonary/Chest: Effort normal.  Abdominal: Soft. She exhibits no distension. There is no tenderness. There is no rebound and no guarding.  Neurological: She is alert and oriented to person, place, and time.  Skin: Skin is warm and dry.  Psychiatric:  Flat affect   Filed Vitals:   10/08/14 1048  BP: 150/90  Pulse: 66  Temp: 98.5 F (36.9 C)  TempSrc: Oral  Resp: 16  Height: 5\' 4"  (1.626 m)  Weight: 146 lb 1.9 oz (66.28 kg)  SpO2: 96%       Assessment & Plan:

## 2014-10-09 NOTE — Assessment & Plan Note (Signed)
Will taper quickly off prozac then give 1 week holiday to see if symptoms resolve. Given her still moderate exacerbation will change to different agent. Will start cymbalta to see if she does better. 30 mg daily for 2 weeks and if tolerated increase to 60 mg daily.

## 2014-10-10 ENCOUNTER — Emergency Department (HOSPITAL_COMMUNITY): Payer: Medicare Other

## 2014-10-10 ENCOUNTER — Encounter (HOSPITAL_COMMUNITY): Payer: Self-pay | Admitting: Emergency Medicine

## 2014-10-10 ENCOUNTER — Emergency Department (HOSPITAL_COMMUNITY)
Admission: EM | Admit: 2014-10-10 | Discharge: 2014-10-10 | Disposition: A | Discharge status: Home / Self Care | Payer: Medicare Other | Attending: Emergency Medicine | Admitting: Emergency Medicine

## 2014-10-10 DIAGNOSIS — G40909 Epilepsy, unspecified, not intractable, without status epilepticus: Secondary | ICD-10-CM | POA: Diagnosis not present

## 2014-10-10 DIAGNOSIS — Y998 Other external cause status: Secondary | ICD-10-CM | POA: Diagnosis not present

## 2014-10-10 DIAGNOSIS — Z7982 Long term (current) use of aspirin: Secondary | ICD-10-CM | POA: Insufficient documentation

## 2014-10-10 DIAGNOSIS — W1839XA Other fall on same level, initial encounter: Secondary | ICD-10-CM | POA: Insufficient documentation

## 2014-10-10 DIAGNOSIS — S0990XA Unspecified injury of head, initial encounter: Secondary | ICD-10-CM | POA: Diagnosis present

## 2014-10-10 DIAGNOSIS — S060X0A Concussion without loss of consciousness, initial encounter: Secondary | ICD-10-CM | POA: Insufficient documentation

## 2014-10-10 DIAGNOSIS — I10 Essential (primary) hypertension: Secondary | ICD-10-CM | POA: Insufficient documentation

## 2014-10-10 DIAGNOSIS — Y9389 Activity, other specified: Secondary | ICD-10-CM | POA: Diagnosis not present

## 2014-10-10 DIAGNOSIS — Y92009 Unspecified place in unspecified non-institutional (private) residence as the place of occurrence of the external cause: Secondary | ICD-10-CM | POA: Diagnosis not present

## 2014-10-10 DIAGNOSIS — Z8673 Personal history of transient ischemic attack (TIA), and cerebral infarction without residual deficits: Secondary | ICD-10-CM | POA: Insufficient documentation

## 2014-10-10 DIAGNOSIS — I209 Angina pectoris, unspecified: Secondary | ICD-10-CM | POA: Diagnosis not present

## 2014-10-10 DIAGNOSIS — G2581 Restless legs syndrome: Secondary | ICD-10-CM | POA: Diagnosis not present

## 2014-10-10 DIAGNOSIS — S0083XA Contusion of other part of head, initial encounter: Secondary | ICD-10-CM | POA: Diagnosis not present

## 2014-10-10 DIAGNOSIS — F329 Major depressive disorder, single episode, unspecified: Secondary | ICD-10-CM | POA: Diagnosis not present

## 2014-10-10 DIAGNOSIS — Z79899 Other long term (current) drug therapy: Secondary | ICD-10-CM | POA: Diagnosis not present

## 2014-10-10 DIAGNOSIS — W19XXXA Unspecified fall, initial encounter: Secondary | ICD-10-CM

## 2014-10-10 DIAGNOSIS — Z720 Tobacco use: Secondary | ICD-10-CM | POA: Insufficient documentation

## 2014-10-10 LAB — CBC WITH DIFFERENTIAL/PLATELET
Basophils Absolute: 0 10*3/uL (ref 0.0–0.1)
Basophils Relative: 0 % (ref 0–1)
EOS ABS: 0.1 10*3/uL (ref 0.0–0.7)
EOS PCT: 1 % (ref 0–5)
HCT: 34.3 % — ABNORMAL LOW (ref 36.0–46.0)
Hemoglobin: 12.1 g/dL (ref 12.0–15.0)
LYMPHS ABS: 1.8 10*3/uL (ref 0.7–4.0)
Lymphocytes Relative: 39 % (ref 12–46)
MCH: 31.4 pg (ref 26.0–34.0)
MCHC: 35.3 g/dL (ref 30.0–36.0)
MCV: 89.1 fL (ref 78.0–100.0)
Monocytes Absolute: 0.3 10*3/uL (ref 0.1–1.0)
Monocytes Relative: 6 % (ref 3–12)
Neutro Abs: 2.4 10*3/uL (ref 1.7–7.7)
Neutrophils Relative %: 54 % (ref 43–77)
PLATELETS: 187 10*3/uL (ref 150–400)
RBC: 3.85 MIL/uL — AB (ref 3.87–5.11)
RDW: 12.4 % (ref 11.5–15.5)
WBC: 4.6 10*3/uL (ref 4.0–10.5)

## 2014-10-10 LAB — URINALYSIS, ROUTINE W REFLEX MICROSCOPIC
BILIRUBIN URINE: NEGATIVE
Glucose, UA: NEGATIVE mg/dL
Hgb urine dipstick: NEGATIVE
Ketones, ur: NEGATIVE mg/dL
Leukocytes, UA: NEGATIVE
Nitrite: NEGATIVE
Protein, ur: NEGATIVE mg/dL
SPECIFIC GRAVITY, URINE: 1.008 (ref 1.005–1.030)
Urobilinogen, UA: 0.2 mg/dL (ref 0.0–1.0)
pH: 5.5 (ref 5.0–8.0)

## 2014-10-10 LAB — I-STAT TROPONIN, ED: Troponin i, poc: 0 ng/mL (ref 0.00–0.08)

## 2014-10-10 LAB — I-STAT CHEM 8, ED
BUN: 7 mg/dL (ref 6–20)
CALCIUM ION: 1.08 mmol/L — AB (ref 1.13–1.30)
Chloride: 94 mmol/L — ABNORMAL LOW (ref 101–111)
Creatinine, Ser: 0.6 mg/dL (ref 0.44–1.00)
Glucose, Bld: 82 mg/dL (ref 65–99)
HCT: 36 % (ref 36.0–46.0)
Hemoglobin: 12.2 g/dL (ref 12.0–15.0)
Potassium: 3.2 mmol/L — ABNORMAL LOW (ref 3.5–5.1)
Sodium: 134 mmol/L — ABNORMAL LOW (ref 135–145)
TCO2: 25 mmol/L (ref 0–100)

## 2014-10-10 LAB — CBG MONITORING, ED: Glucose-Capillary: 82 mg/dL (ref 65–99)

## 2014-10-10 MED ORDER — POTASSIUM CHLORIDE CRYS ER 20 MEQ PO TBCR
40.0000 meq | EXTENDED_RELEASE_TABLET | Freq: Once | ORAL | Status: AC
  Administered 2014-10-10: 40 meq via ORAL
  Filled 2014-10-10: qty 2

## 2014-10-10 MED ORDER — ONDANSETRON HCL 4 MG/2ML IJ SOLN
4.0000 mg | Freq: Once | INTRAMUSCULAR | Status: AC
  Administered 2014-10-10: 4 mg via INTRAVENOUS
  Filled 2014-10-10: qty 2

## 2014-10-10 MED ORDER — FENTANYL CITRATE (PF) 100 MCG/2ML IJ SOLN
50.0000 ug | Freq: Once | INTRAMUSCULAR | Status: AC
  Administered 2014-10-10: 50 ug via INTRAVENOUS
  Filled 2014-10-10: qty 2

## 2014-10-10 NOTE — ED Notes (Signed)
Pt to ED via [facility] for fall at 2 am this morning. Per EMS - Pt lives at home alone in an apartment; at 2 am pt called daughter and let her know she had fallen, pt has hematoma to right upper forehead. Pt does not recall calling daughter at 2 am. Pt is a/o x4. Pt is drowsy on arrival. Pt is not on blood thinners. Pt has hx of seizures and stroke. CBG 79, BP 130/90, HR @ 60. Pt is complaining of hip pain to right hip. Pt right hand is bruised as well.

## 2014-10-10 NOTE — ED Provider Notes (Signed)
CSN: [id number]     Arrival date & time 10/10/14  6440 History   First MD Initiated Contact with Patient 10/10/14 [id number]     Chief Complaint  Patient presents with  . Fall  . Fatigue     (Consider location/radiation/quality/duration/timing/severity/associated sxs/prior Treatment) HPI   59 year old female with history of prior stroke, angina, recurrent dizziness, and brought here via EMS from home for evaluation of a fall. Patient reports since she had a stroke back in 2013 she has had residual right leg weakness. Her legs occasionally give out on her. This morning approximately 2 AM she was going to the bathroom when her leg gave out causing her to fall forward. She initially called her daughter to notify that she has fallen but later on she cannot recall calling her daughter. EMS brought the patient in and noticed that the patient was drowsy and arrival. She has multiple bruising throughout her body. She does not recall any precipitating symptoms prior to the fall. She does complaining of pain to her forehead and pain to her right hand from the fall. No specific treatment tried. At this time she denies having any neck pain, active chest pain, shortness of breath, productive cough, abdominal pain, back pain, nausea vomiting diarrhea, dysuria, focal numbness. She is not on any blood thinner medication. Patient does admits to drinking a glass of wine nightly, one last night.  Denies any rec drugs use.  No recent medication changes except currently weaning her cymbalta due to recurrent nausea.                 Past Medical History  Diagnosis Date  . Depression   . Hypertension   . Angina   . Restless leg syndrome   . TIA (transient ischemic attack) 08/2010  . Stroke 04/20/2011    a. 04/20/11 Left subcortical infarct treated w/ TPA;  b. 04/2011 Echo: EF 55-60%;  c. 04/2011 Normal Carotid u/s  d. Residual "walk w/limp on right; unable to grasp w/right hand"  . Tobacco abuse     a. quit @ time of CVA  04/2011.  [name] RLS (restless legs syndrome) 11/16/2011  . Seizures    Past Surgical History  Procedure Laterality Date  . Mastectomy  1980's    bilateral with reconstruction  . Breast reconstruction      bilaterally  . Cesarean section  1979  . Knee arthroscopy  1990's    left; cartilage repair  . Carpal tunnel release  1990's    left  . Left heart catheterization with coronary angiogram N/A 05/20/2011    Procedure: LEFT HEART CATHETERIZATION WITH CORONARY ANGIOGRAM;  Surgeon: Josue Hector, MD;  Location: [facility] CATH LAB;  Service: Cardiovascular;  Laterality: N/A;   Family History  Problem Relation Age of Onset  . Lupus Mother     died early 69's.  . Emphysema Father     died late 58's.  [name] COPD Father    Social History  Substance Use Topics  . Smoking status: Current Every Day Smoker -- 0.50 packs/day for 37 years    Types: Cigarettes  . Smokeless tobacco: Never Used     Comment: quit 04/20/2011, restarted in May '14  . Alcohol Use: Yes     Comment: very occasional   OB History    No data available     Review of Systems  All other systems reviewed and are negative.     Allergies  Codeine  Home Medications   Prior to  Admission medications   Medication Sig Start Date End Date Taking? Authorizing Provider  aspirin EC 325 MG tablet Take 325 mg by mouth daily.    Historical Provider, MD  atorvastatin (LIPITOR) 80 MG tablet Take 1 tablet (80 mg total) by mouth daily. 05/22/14   Olga Millers, MD  baclofen (LIORESAL) 20 MG tablet TAKE ONE-HALF TABLET BY MOUTH THREE TIMES DAILY FOR  ONE  WEEK,  THEN  INCREASE  TO  TAKE  ONE  TABLET  BY  MOUTH  THREE  TIMES  DAILY 06/24/14   Garvin Fila, MD  carbamide peroxide (DEBROX) 6.5 % otic solution Place 5 drops into the left ear 2 (two) times daily. Patient not taking: Reported on 10/08/2014 04/04/14   Olga Millers, MD  clonazePAM (KLONOPIN) 1 MG tablet Take 1 tablet (1 mg total) by mouth at bedtime. 09/17/14   Janith Lima,  MD  diclofenac sodium (VOLTAREN) 1 % GEL Apply 2 g topically 4 (four) times daily. Patient not taking: Reported on 10/08/2014 12/20/13   Olga Millers, MD  DULoxetine (CYMBALTA) 30 MG capsule Take 1 capsule (30 mg total) by mouth daily. 10/08/14   Olga Millers, MD  FLUoxetine (PROZAC) 20 MG tablet Take 3 tablets (60 mg total) by mouth daily. 07/10/14   Olga Millers, MD  hydrOXYzine (VISTARIL) 25 MG capsule Take 25 mg by mouth daily as needed for anxiety.    Historical Provider, MD  levETIRAcetam (KEPPRA) 500 MG tablet One half tablet in the morning and one full tablet in the evening Patient taking differently: One half tablet in the morning and one half tablet in the evening 12/30/13   Garvin Fila, MD  Multiple Vitamin (MULTIVITAMIN) capsule Take 1 capsule by mouth.    Historical Provider, MD  ondansetron (ZOFRAN) 4 MG tablet Take 1 tablet (4 mg total) by mouth every 8 (eight) hours as needed for nausea or vomiting. 08/28/14   Hendricks Limes, MD  traMADol-acetaminophen (ULTRACET) 37.5-325 MG per tablet Take 1 tablet by mouth every 8 (eight) hours as needed. 07/10/14   Charlett Blake, MD  triamterene-hydrochlorothiazide (MAXZIDE) 75-50 MG per tablet Take 0.5 tablets by mouth daily. 06/24/14   Olga Millers, MD   BP 148/95 mmHg  Pulse 62  Temp(Src) 97.9 F (36.6 C) (Oral)  Resp 13  SpO2 95% Physical Exam  Constitutional: She is oriented to person, place, and time. She appears well-developed and well-nourished. No distress.  Caucasian female, laying in bed, wearing c-collar, eyes closed but has a question appropriately.  HENT:  Head: Atraumatic.  Ecchymosis noted to right forehead that is tender to palpation but no crepitus and no active bleeding. No hemotympanum, no septal hematoma, no malocclusion, no midface tenderness  Eyes: Conjunctivae and EOM are normal. Pupils are equal, round, and reactive to light.  Neck: Neck supple.  No cervical midline spine tenderness  crepitus or step-off.  Cardiovascular: Normal rate and regular rhythm.   Pulmonary/Chest: Effort normal and breath sounds normal.  Abdominal: Soft. There is no tenderness.  Musculoskeletal: She exhibits tenderness (Right hand: Ecchymosis noted to dorsum of hand is tender to palpation but no crepitus. Decreased grip strength secondary to pain. Radial pulse 2+.).  R knee: tenderness to anterior knee without bruising, no crepitus.    Neurological: She is alert and oriented to person, place, and time. She has normal strength. No cranial nerve deficit or sensory deficit. GCS eye subscore is 4. GCS verbal subscore is 5.  GCS motor subscore is 6.  Skin: No rash noted.  Psychiatric: She has a normal mood and affect.  Nursing note and vitals reviewed.   ED Course  Procedures (including critical care time)  10:03 AM Patient with a fall earlier this morning came in with and ecchymosis to her right forehead and bruising to her right hand and right knee. Unsure if it is a mechanical fall but she denies any precipitating symptoms prior to the fall. Plan to obtain head CT scan, basic labs, troponin, urinalysis, EKG. She has no focal neuro deficit on exam. She initially was drowsy but now appears to be more alert and oriented and answered questions appropriately. Suspect concussion. Cervical spine was cleared using [employer].    12:11 PM Aside from a hematoma to patient's right forehead and hematoma to her right hand and knee, her x-rays and CT scan shows no acute fractures or dislocation. No intracranial hemorrhage. Urine shows no signs of urinary tract infection. Mild hypokalemia with a potassium of 3.2, supplementation given in the ED. Labs are otherwise reassuring. Normal CBG. Patient now back to her baseline, able to ambulate.  EKG shows rate-controlled A. fib this is her baseline. No concerning arrhythmia.  Pt able to ambulate.  She's stable for discharge. Return precaution given.    Labs  Review Labs Reviewed  CBC WITH DIFFERENTIAL/PLATELET - Abnormal; Notable for the following:    RBC 3.85 (*)    HCT 34.3 (*)    All other components within normal limits  I-STAT CHEM 8, ED - Abnormal; Notable for the following:    Sodium 134 (*)    Potassium 3.2 (*)    Chloride 94 (*)    Calcium, Ion 1.08 (*)    All other components within normal limits  URINALYSIS, ROUTINE W REFLEX MICROSCOPIC (NOT AT [facility])  I-STAT TROPOININ, ED  CBG MONITORING, ED    Imaging Review Dg Chest 2 View  10/10/2014   CLINICAL DATA:  Status post syncopal episode.  EXAM: CHEST  2 VIEW  COMPARISON:  CT of the chest dated 08/17/2013, chest radiograph dated 08/15/2013.  FINDINGS: Cardiomediastinal silhouette is mildly enlarged. Mediastinal contours appear intact. The aorta is torturous and contains atherosclerotic calcifications at the arch.  There is no evidence of focal airspace consolidation, or pneumothorax. There is small left pleural effusion or pleural thickening.  Osseous structures are without acute abnormality. Soft tissues are grossly normal.  IMPRESSION: Mildly enlarged cardiac silhouette.  Probable small left pleural effusion.  Atherosclerotic disease of the aorta.   Electronically Signed   By: Fidela Salisbury M.D.   On: 10/10/2014 11:14   Ct Head Wo Contrast  10/10/2014   CLINICAL DATA:  Status post fall this morning with contusion and abrasions to the forehead.  EXAM: CT HEAD WITHOUT CONTRAST  TECHNIQUE: Contiguous axial images were obtained from the base of the skull through the vertex without intravenous contrast.  COMPARISON:  MRI of the head dated 04/09/2014  FINDINGS: No mass effect or midline shift. No evidence of acute intracranial hemorrhage, or infarction. No abnormal extra-axial fluid collections. Gray-white matter differentiation is normal. Basal cisterns are preserved. Lacunar old infarct of basal ganglia are noted. There is a large right frontal hematoma.  No depressed skull fractures.  Visualized paranasal sinuses and mastoid air cells are not opacified.  IMPRESSION: No acute intracranial abnormality.  Chronic lacunar basal ganglia infarct.  Right frontal hematoma without underlying bony abnormality.   Electronically Signed   By: Fidela Salisbury  M.D.   On: 10/10/2014 11:50   Dg Knee Complete 4 Views Right  10/10/2014   CLINICAL DATA:  Status post fall with abrasion to the anterior right knee.  EXAM: RIGHT KNEE - COMPLETE 4+ VIEW  COMPARISON:  None.  FINDINGS: No evidence of acute fracture or subluxation. No focal bone lesion or bone destruction. Bone cortex and trabecular architecture appear intact. No radiopaque soft tissue foreign bodies. Sherald Barge is noted.  IMPRESSION: No acute fracture or dislocation identified about the right Knee.   Electronically Signed   By: Fidela Salisbury M.D.   On: 10/10/2014 11:09   Dg Hand Complete Right  10/10/2014   CLINICAL DATA:  Bruising of the right hand status post fall.  EXAM: RIGHT HAND - COMPLETE 3+ VIEW  COMPARISON:  None.  FINDINGS: No evidence of acute fracture or subluxation. No focal bone lesion or bone destruction. Bone cortex and trabecular architecture appear intact. Mild osteoarthritic changes of the first carpometacarpal joints are seen. No radiopaque soft tissue foreign bodies.  IMPRESSION: No acute fracture or dislocation identified about the right Hand.  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   Electronically Signed   By: Fidela Salisbury M.D.   On: 10/10/2014 11:11   I have personally reviewed and evaluated these images and lab results as part of my medical decision-making.   EKG Interpretation   Date/Time:  Thursday October 10 2014 12:18:45 EDT Ventricular Rate:  63 PR Interval:    QRS Duration: 95 QT Interval:  484 QTC Calculation: 495 R Axis:   -59 Text Interpretation:  Sinus rhythm Left anterior fascicular block  Borderline T abnormalities, anterior leads Borderline prolonged QT   interval Baseline wander in lead(s) V3 No significant change since last  tracing Confirmed by Lonia Skinner ([id number]) on 10/10/2014 12:28:14 PM      MDM   Final diagnoses:  Fall at home, initial encounter  Traumatic hematoma of forehead, initial encounter  Concussion, without loss of consciousness, initial encounter    BP 132/94 mmHg  Pulse 65  Temp(Src) 97.8 F (36.6 C) (Oral)  Resp 22  SpO2 97%  I have reviewed nursing notes and vital signs. I personally viewed the imaging tests through PACS system and agrees with radiologist's intepretation I reviewed available ER/hospitalization records through the EMR     Domenic Moras, PA-C 10/10/14 [place], MD 10/10/14 1246

## 2014-10-10 NOTE — ED Notes (Signed)
Pt CBG, 82. Nurse was notified.

## 2014-10-10 NOTE — ED Notes (Signed)
Pt ambulated without difficulty, gait steady, denies dizziness.

## 2014-10-10 NOTE — Discharge Instructions (Signed)
°Concussion °A concussion is a brain injury. It is caused by: °· A hit to the head. °· A quick and sudden movement (jolt) of the head or neck. °A concussion is usually not life threatening. Even so, it can cause serious problems. If you had a concussion before, you may have concussion-like problems after a hit to your head. °HOME CARE °General Instructions °· Follow your doctor's directions carefully. °· Take medicines only as told by your doctor. °· Only take medicines your doctor says are safe. °· Do not drink alcohol until your doctor says it is okay. Alcohol and some drugs can slow down healing. They can also put you at risk for further injury. °· If you are having trouble remembering things, write them down. °· Try to do one thing at a time if you get distracted easily. For example, do not watch TV while making dinner. °· Talk to your family members or close friends when making important decisions. °· Follow up with your doctor as told. °· Watch your symptoms. Tell others to do the same. Serious problems can sometimes happen after a concussion. Older adults are more likely to have these problems. °· Tell your teachers, school nurse, school counselor, coach, athletic trainer, or work manager about your concussion. Tell them about what you can or cannot do. They should watch to see if: °¨ It gets even harder for you to pay attention or concentrate. °¨ It gets even harder for you to remember things or learn new things. °¨ You need more time than normal to finish things. °¨ You become annoyed (irritable) more than before. °¨ You are not able to deal with stress as well. °¨ You have more problems than before. °· Rest. Make sure you: °¨ Get plenty of sleep at night. °¨ Go to sleep early. °¨ Go to bed at the same time every day. Try to wake up at the same time. °¨ Rest during the day. °¨ Take naps when you feel tired. °· Limit activities where you have to think a lot or concentrate. These include: °¨ Doing  homework. °¨ Doing work related to a job. °¨ Watching TV. °¨ Using the computer. °Returning To Your Regular Activities °Return to your normal activities slowly, not all at once. You must give your body and brain enough time to heal.  °· Do not play sports or do other athletic activities until your doctor says it is okay. °· Ask your doctor when you can drive, ride a bicycle, or work other vehicles or machines. Never do these things if you feel dizzy. °· Ask your doctor about when you can return to work or school. °Preventing Another Concussion °It is very important to avoid another brain injury, especially before you have healed. In rare cases, another injury can lead to permanent brain damage, brain swelling, or death. The risk of this is greatest during the first 7-10 days after your injury. Avoid injuries by:  °· Wearing a seat belt when riding in a car. °· Not drinking too much alcohol. °· Avoiding activities that could lead to a second concussion (such as contact sports). °· Wearing a helmet when doing activities like: °¨ Biking. °¨ Skiing. °¨ Skateboarding. °¨ Skating. °· Making your home safer by: °¨ Removing things from the floor or stairways that could make you trip. °¨ Using grab bars in bathrooms and handrails by stairs. °¨ Placing non-slip mats on floors and in bathtubs. °¨ Improve lighting in dark areas. °GET HELP IF: °· It   gets even harder for you to pay attention or concentrate.  It gets even harder for you to remember things or learn new things.  You need more time than normal to finish things.  You become annoyed (irritable) more than before.  You are not able to deal with stress as well.  You have more problems than before.  You have problems keeping your balance.  You are not able to react quickly when you should. Get help if you have any of these problems for more than 2 weeks:   Lasting (chronic) headaches.  Dizziness or trouble balancing.  Feeling sick to your stomach  (nausea).  Seeing (vision) problems.  Being affected by noises or light more than normal.  Feeling sad, low, down in the dumps, blue, gloomy, or empty (depressed).  Mood changes (mood swings).  Feeling of fear or nervousness about what may happen (anxiety).  Feeling annoyed.  Memory problems.  Problems concentrating or paying attention.  Sleep problems.  Feeling tired all the time. GET HELP RIGHT AWAY IF:   You have bad headaches or your headaches get worse.  You have weakness (even if it is in one hand, leg, or part of the face).  You have loss of feeling (numbness).  You feel off balance.  You keep throwing up (vomiting).  You feel tired.  One black center of your eye (pupil) is larger than the other.  You twitch or shake violently (convulse).  Your speech is not clear (slurred).  You are more confused, easily angered (agitated), or annoyed than before.  You have more trouble resting than before.  You are unable to recognize people or places.  You have neck pain.  It is difficult to wake you up.  You have unusual behavior changes.  You pass out (lose consciousness). MAKE SURE YOU:   Understand these instructions.  Will watch your condition.  Will get help right away if you are not doing well or get worse. Document Released: 01/06/2009 Document Revised: 06/04/2013 Document Reviewed: 08/10/2012 [facility] Patient Information 2015 [place], [place]. This information is not intended to replace advice given to you by your health care provider. Make sure you discuss any questions you have with your health care provider. Fall Prevention and Home Safety Falls cause injuries and can affect all age groups. It is possible to use preventive measures to significantly decrease the likelihood of falls. There are many simple measures which can make your home safer and prevent falls. OUTDOORS  Repair cracks and edges of walkways and driveways.  Remove high doorway  thresholds.  Trim shrubbery on the main path into your home.  Have good outside lighting.  Clear walkways of tools, rocks, debris, and clutter.  Check that handrails are not broken and are securely fastened. Both sides of steps should have handrails.  Have leaves, snow, and ice cleared regularly.  Use sand or salt on walkways during winter months.  In the garage, clean up grease or oil spills. BATHROOM  Install night lights.  Install grab bars by the toilet and in the tub and shower.  Use non-skid mats or decals in the tub or shower.  Place a plastic non-slip stool in the shower to sit on, if needed.  Keep floors dry and clean up all water on the floor immediately.  Remove soap buildup in the tub or shower on a regular basis.  Secure bath mats with non-slip, double-sided rug tape.  Remove throw rugs and tripping hazards from the floors. BEDROOMS  Install night lights.  Make sure a bedside light is easy to reach.  Do not use oversized bedding.  Keep a telephone by your bedside.  Have a firm chair with side arms to use for getting dressed.  Remove throw rugs and tripping hazards from the floor. KITCHEN  Keep handles on pots and pans turned toward the center of the stove. Use back burners when possible.  Clean up spills quickly and allow time for drying.  Avoid walking on wet floors.  Avoid hot utensils and knives.  Position shelves so they are not too high or low.  Place commonly used objects within easy reach.  If necessary, use a sturdy step stool with a grab bar when reaching.  Keep electrical cables out of the way.  Do not use floor polish or wax that makes floors slippery. If you must use wax, use non-skid floor wax.  Remove throw rugs and tripping hazards from the floor. STAIRWAYS  Never leave objects on stairs.  Place handrails on both sides of stairways and use them. Fix any loose handrails. Make sure handrails on both sides of the stairways  are as long as the stairs.  Check carpeting to make sure it is firmly attached along stairs. Make repairs to worn or loose carpet promptly.  Avoid placing throw rugs at the top or bottom of stairways, or properly secure the rug with carpet tape to prevent slippage. Get rid of throw rugs, if possible.  Have an electrician put in a light switch at the top and bottom of the stairs. OTHER FALL PREVENTION TIPS  Wear low-heel or rubber-soled shoes that are supportive and fit well. Wear closed toe shoes.  When using a stepladder, make sure it is fully opened and both spreaders are firmly locked. Do not climb a closed stepladder.  Add color or contrast paint or tape to grab bars and handrails in your home. Place contrasting color strips on first and last steps.  Learn and use mobility aids as needed. Install an electrical emergency response system.  Turn on lights to avoid dark areas. Replace light bulbs that burn out immediately. Get light switches that glow.  Arrange furniture to create clear pathways. Keep furniture in the same place.  Firmly attach carpet with non-skid or double-sided tape.  Eliminate uneven floor surfaces.  Select a carpet pattern that does not visually hide the edge of steps.  Be aware of all pets. OTHER HOME SAFETY TIPS  Set the water temperature for 120 F (48.8 C).  Keep emergency numbers on or near the telephone.  Keep smoke detectors on every level of the home and near sleeping areas. Document Released: 01/08/2002 Document Revised: 07/20/2011 Document Reviewed: 04/09/2011 [facility] Patient Information 2015 [place], [place]. This information is not intended to replace advice given to you by your health care provider. Make sure you discuss any questions you have with your health care provider.

## 2014-10-16 ENCOUNTER — Other Ambulatory Visit: Payer: Self-pay | Admitting: Neurology

## 2014-10-16 NOTE — Telephone Encounter (Signed)
Last OV note says: Keppra dose to 250 mg twice daily as she has not had any further seizures now for a couple of years.

## 2014-11-12 ENCOUNTER — Ambulatory Visit (INDEPENDENT_AMBULATORY_CARE_PROVIDER_SITE_OTHER): Payer: Medicare Other | Admitting: Internal Medicine

## 2014-11-12 ENCOUNTER — Encounter: Payer: Self-pay | Admitting: Internal Medicine

## 2014-11-12 VITALS — BP 120/62 | HR 89 | Temp 97.8°F | Resp 18 | Ht 64.0 in | Wt 148.0 lb

## 2014-11-12 DIAGNOSIS — F32A Depression, unspecified: Secondary | ICD-10-CM

## 2014-11-12 DIAGNOSIS — F329 Major depressive disorder, single episode, unspecified: Secondary | ICD-10-CM | POA: Diagnosis not present

## 2014-11-12 DIAGNOSIS — Z0000 Encounter for general adult medical examination without abnormal findings: Secondary | ICD-10-CM

## 2014-11-12 MED ORDER — DULOXETINE HCL 60 MG PO CPEP
60.0000 mg | ORAL_CAPSULE | Freq: Every day | ORAL | Status: DC
Start: 2014-11-12 — End: 2015-05-26

## 2014-11-12 MED ORDER — PRAMIPEXOLE DIHYDROCHLORIDE 0.5 MG PO TABS: 0.5000 mg | ORAL_TABLET | Freq: Every evening | ORAL | Status: DC | PRN

## 2014-11-12 NOTE — Patient Instructions (Signed)
We have given you the flu shot today.   We have increased the cymbalta to 60 mg daily and sent in the new prescription. You are still taking 1 pill per day.   We have sent in a new medicine for the restless legs called mirapex. Take 1 pill at bedtime for the restlessness.   Come back in about 3-6 months for a follow up.   Health Maintenance, Female Adopting a healthy lifestyle and getting preventive care can go a long way to promote health and wellness. Talk with your health care provider about what schedule of regular examinations is right for you. This is a good chance for you to check in with your provider about disease prevention and staying healthy. In between checkups, there are plenty of things you can do on your own. Experts have done a lot of research about which lifestyle changes and preventive measures are most likely to keep you healthy. Ask your health care provider for more information. WEIGHT AND DIET  Eat a healthy diet  Be sure to include plenty of vegetables, fruits, low-fat dairy products, and lean protein.  Do not eat a lot of foods high in solid fats, added sugars, or salt.  Get regular exercise. This is one of the most important things you can do for your health.  Most adults should exercise for at least 150 minutes each week. The exercise should increase your heart rate and make you sweat (moderate-intensity exercise).  Most adults should also do strengthening exercises at least twice a week. This is in addition to the moderate-intensity exercise.  Maintain a healthy weight  Body mass index (BMI) is a measurement that can be used to identify possible weight problems. It estimates body fat based on height and weight. Your health care provider can help determine your BMI and help you achieve or maintain a healthy weight.  For females 52 years of age and older:   A BMI below 18.5 is considered underweight.  A BMI of 18.5 to 24.9 is normal.  A BMI of 25 to 29.9  is considered overweight.  A BMI of 30 and above is considered obese.  Watch levels of cholesterol and blood lipids  You should start having your blood tested for lipids and cholesterol at 60 years of age, then have this test every 5 years.  You may need to have your cholesterol levels checked more often if:  Your lipid or cholesterol levels are high.  You are older than 60 years of age.  You are at high risk for heart disease.  CANCER SCREENING   Lung Cancer  Lung cancer screening is recommended for adults 65-34 years old who are at high risk for lung cancer because of a history of smoking.  A yearly low-dose CT scan of the lungs is recommended for people who:  Currently smoke.  Have quit within the past 15 years.  Have at least a 30-pack-year history of smoking. A pack year is smoking an average of one pack of cigarettes a day for 1 year.  Yearly screening should continue until it has been 15 years since you quit.  Yearly screening should stop if you develop a health problem that would prevent you from having lung cancer treatment.  Breast Cancer  Practice breast self-awareness. This means understanding how your breasts normally appear and feel.  It also means doing regular breast self-exams. Let your health care provider know about any changes, no matter how small.  If you are in  your 20s or 30s, you should have a clinical breast exam (CBE) by a health care provider every 1-3 years as part of a regular health exam.  If you are 19 or older, have a CBE every year. Also consider having a breast X-ray (mammogram) every year.  If you have a family history of breast cancer, talk to your health care provider about genetic screening.  If you are at high risk for breast cancer, talk to your health care provider about having an MRI and a mammogram every year.  Breast cancer gene (BRCA) assessment is recommended for women who have family members with BRCA-related cancers.  BRCA-related cancers include:  Breast.  Ovarian.  Tubal.  Peritoneal cancers.  Results of the assessment will determine the need for genetic counseling and BRCA1 and BRCA2 testing. Cervical Cancer Your health care provider may recommend that you be screened regularly for cancer of the pelvic organs (ovaries, uterus, and vagina). This screening involves a pelvic examination, including checking for microscopic changes to the surface of your cervix (Pap test). You may be encouraged to have this screening done every 3 years, beginning at age 65.  For women ages 23-65, health care providers may recommend pelvic exams and Pap testing every 3 years, or they may recommend the Pap and pelvic exam, combined with testing for human papilloma virus (HPV), every 5 years. Some types of HPV increase your risk of cervical cancer. Testing for HPV may also be done on women of any age with unclear Pap test results.  Other health care providers may not recommend any screening for nonpregnant women who are considered low risk for pelvic cancer and who do not have symptoms. Ask your health care provider if a screening pelvic exam is right for you.  If you have had past treatment for cervical cancer or a condition that could lead to cancer, you need Pap tests and screening for cancer for at least 20 years after your treatment. If Pap tests have been discontinued, your risk factors (such as having a new sexual partner) need to be reassessed to determine if screening should resume. Some women have medical problems that increase the chance of getting cervical cancer. In these cases, your health care provider may recommend more frequent screening and Pap tests. Colorectal Cancer  This type of cancer can be detected and often prevented.  Routine colorectal cancer screening usually begins at 60 years of age and continues through 60 years of age.  Your health care provider may recommend screening at an earlier age if you  have risk factors for colon cancer.  Your health care provider may also recommend using home test kits to check for hidden blood in the stool.  A small camera at the end of a tube can be used to examine your colon directly (sigmoidoscopy or colonoscopy). This is done to check for the earliest forms of colorectal cancer.  Routine screening usually begins at age 36.  Direct examination of the colon should be repeated every 5-10 years through 60 years of age. However, you may need to be screened more often if early forms of precancerous polyps or small growths are found. Skin Cancer  Check your skin from head to toe regularly.  Tell your health care provider about any new moles or changes in moles, especially if there is a change in a mole's shape or color.  Also tell your health care provider if you have a mole that is larger than the size of a pencil  eraser.  Always use sunscreen. Apply sunscreen liberally and repeatedly throughout the day.  Protect yourself by wearing long sleeves, pants, a wide-brimmed hat, and sunglasses whenever you are outside. HEART DISEASE, DIABETES, AND HIGH BLOOD PRESSURE   High blood pressure causes heart disease and increases the risk of stroke. High blood pressure is more likely to develop in:  People who have blood pressure in the high end of the normal range (130-139/85-89 mm Hg).  People who are overweight or obese.  People who are African American.  If you are 27-29 years of age, have your blood pressure checked every 3-5 years. If you are 41 years of age or older, have your blood pressure checked every year. You should have your blood pressure measured twice--once when you are at a hospital or clinic, and once when you are not at a hospital or clinic. Record the average of the two measurements. To check your blood pressure when you are not at a hospital or clinic, you can use:  An automated blood pressure machine at a pharmacy.  A home blood pressure  monitor.  If you are between 79 years and 9 years old, ask your health care provider if you should take aspirin to prevent strokes.  Have regular diabetes screenings. This involves taking a blood sample to check your fasting blood sugar level.  If you are at a normal weight and have a low risk for diabetes, have this test once every three years after 60 years of age.  If you are overweight and have a high risk for diabetes, consider being tested at a younger age or more often. PREVENTING INFECTION  Hepatitis B  If you have a higher risk for hepatitis B, you should be screened for this virus. You are considered at high risk for hepatitis B if:  You were born in a country where hepatitis B is common. Ask your health care provider which countries are considered high risk.  Your parents were born in a high-risk country, and you have not been immunized against hepatitis B (hepatitis B vaccine).  You have HIV or AIDS.  You use needles to inject street drugs.  You live with someone who has hepatitis B.  You have had sex with someone who has hepatitis B.  You get hemodialysis treatment.  You take certain medicines for conditions, including cancer, organ transplantation, and autoimmune conditions. Hepatitis C  Blood testing is recommended for:  Everyone born from [birth year] through 1966.  Anyone with known risk factors for hepatitis C. Sexually transmitted infections (STIs)  You should be screened for sexually transmitted infections (STIs) including gonorrhea and chlamydia if:  You are sexually active and are younger than 60 years of age.  You are older than 60 years of age and your health care provider tells you that you are at risk for this type of infection.  Your sexual activity has changed since you were last screened and you are at an increased risk for chlamydia or gonorrhea. Ask your health care provider if you are at risk.  If you do not have HIV, but are at risk, it may be  recommended that you take a prescription medicine daily to prevent HIV infection. This is called pre-exposure prophylaxis (PrEP). You are considered at risk if:  You are sexually active and do not regularly use condoms or know the HIV status of your partner(s).  You take drugs by injection.  You are sexually active with a partner who has HIV. Talk with  your health care provider about whether you are at high risk of being infected with HIV. If you choose to begin PrEP, you should first be tested for HIV. You should then be tested every 3 months for as long as you are taking PrEP.  PREGNANCY   If you are premenopausal and you may become pregnant, ask your health care provider about preconception counseling.  If you may become pregnant, take 400 to 800 micrograms (mcg) of folic acid every day.  If you want to prevent pregnancy, talk to your health care provider about birth control (contraception). OSTEOPOROSIS AND MENOPAUSE   Osteoporosis is a disease in which the bones lose minerals and strength with aging. This can result in serious bone fractures. Your risk for osteoporosis can be identified using a bone density scan.  If you are 3 years of age or older, or if you are at risk for osteoporosis and fractures, ask your health care provider if you should be screened.  Ask your health care provider whether you should take a calcium or vitamin D supplement to lower your risk for osteoporosis.  Menopause may have certain physical symptoms and risks.  Hormone replacement therapy may reduce some of these symptoms and risks. Talk to your health care provider about whether hormone replacement therapy is right for you.  HOME CARE INSTRUCTIONS   Schedule regular health, dental, and eye exams.  Stay current with your immunizations.   Do not use any tobacco products including cigarettes, chewing tobacco, or electronic cigarettes.  If you are pregnant, do not drink alcohol.  If you are  breastfeeding, limit how much and how often you drink alcohol.  Limit alcohol intake to no more than 1 drink per day for nonpregnant women. One drink equals 12 ounces of beer, 5 ounces of wine, or 1 ounces of hard liquor.  Do not use street drugs.  Do not share needles.  Ask your health care provider for help if you need support or information about quitting drugs.  Tell your health care provider if you often feel depressed.  Tell your health care provider if you have ever been abused or do not feel safe at home.   This information is not intended to replace advice given to you by your health care provider. Make sure you discuss any questions you have with your health care provider.   Document Released: 08/03/2010 Document Revised: 02/08/2014 Document Reviewed: 12/20/2012 Elsevier Interactive Patient Education [employer].

## 2014-11-12 NOTE — Progress Notes (Signed)
   Subjective:    Patient ID: Anna Lyons, female    DOB: 1955-05-30, 59 y.o.   MRN: [id number]  HPI Here for medicare wellness, no new complaints. Please see A/P for status and treatment of chronic medical problems.   Diet: heart healthy Physical activity: sedentary Depression/mood screen: negative Hearing: intact to whispered voice Visual acuity: grossly normal, performs annual eye exam  ADLs: capable Fall risk: medium,  Home safety: good Cognitive evaluation: intact to orientation, naming, recall and repetition EOL planning: adv directives discussed, talked to her family about it not in place  I have personally reviewed and have noted 1. The patient's medical and social history - reviewed today no changes 2. Their use of alcohol, tobacco or illicit drugs 3. Their current medications and supplements 4. The patient's functional ability including ADL's, fall risks, home safety risks and hearing or visual impairment. 5. Diet and physical activities 6. Evidence for depression or mood disorders 7. Care team reviewed and updated (available in snapshot)  Review of Systems  Constitutional: Positive for activity change and appetite change. Negative for fever, fatigue and unexpected weight change.  Respiratory: Negative.   Cardiovascular: Negative.   Gastrointestinal: Negative for nausea, vomiting, abdominal pain, diarrhea, constipation, blood in stool and abdominal distention.  Musculoskeletal: Negative.   Skin: Negative.   Neurological: Negative.   Psychiatric/Behavioral: Positive for sleep disturbance, dysphoric mood, decreased concentration and agitation. Negative for suicidal ideas and self-injury. The patient is nervous/anxious. The patient is not hyperactive.       Objective:   Physical Exam  Constitutional: She is oriented to person, place, and time. She appears well-developed and well-nourished.  HENT:  Head: Normocephalic and atraumatic.  Eyes: EOM are normal.  Neck:  Normal range of motion.  Cardiovascular: Normal rate and regular rhythm.   Pulmonary/Chest: Effort normal.  Abdominal: Soft. She exhibits no distension. There is no tenderness. There is no rebound and no guarding.  Neurological: She is alert and oriented to person, place, and time.  Skin: Skin is warm and dry.  Psychiatric:  Flat affect   Filed Vitals:   11/12/14 1508  BP: 120/62  Pulse: 89  Temp: 97.8 F (36.6 C)  TempSrc: Oral  Resp: 18  Height: 5\' 4"  (1.626 m)  Weight: 148 lb (67.132 kg)  SpO2: 95%      Assessment & Plan:  Flu shot given at visit.

## 2014-11-12 NOTE — Progress Notes (Signed)
Pre visit review using our clinic review tool, if applicable. No additional management support is needed unless otherwise documented below in the visit note. 

## 2014-11-13 ENCOUNTER — Other Ambulatory Visit: Payer: Medicare Other

## 2014-11-13 ENCOUNTER — Ambulatory Visit: Payer: Medicare Other | Admitting: Adult Health

## 2014-11-13 ENCOUNTER — Telehealth: Payer: Self-pay

## 2014-11-13 NOTE — Telephone Encounter (Signed)
Pt returned Casandra's call. This operator r/s appt to 11/28/14.

## 2014-11-13 NOTE — Telephone Encounter (Signed)
Called patient to r/s appt with NP-Megan today until after carotid doppler completed on 11/21/14. No answer. Patient called back and r/s.

## 2014-11-15 DIAGNOSIS — Z0000 Encounter for general adult medical examination without abnormal findings: Secondary | ICD-10-CM | POA: Insufficient documentation

## 2014-11-15 NOTE — Assessment & Plan Note (Signed)
Increase cymbalta to 60mg daily 

## 2014-11-15 NOTE — Assessment & Plan Note (Signed)
Reviewed recent labs and none needed today. Given flu shot to update her immunizations. Given 10 year screening recommendations. Counseled on advanced directives as she had never discussed before. Declines colonoscopy and mammogram today but will think about it after counseling.

## 2014-11-21 ENCOUNTER — Ambulatory Visit (INDEPENDENT_AMBULATORY_CARE_PROVIDER_SITE_OTHER): Payer: Medicare Other

## 2014-11-21 ENCOUNTER — Other Ambulatory Visit: Payer: Self-pay | Admitting: Internal Medicine

## 2014-11-21 DIAGNOSIS — Z8673 Personal history of transient ischemic attack (TIA), and cerebral infarction without residual deficits: Secondary | ICD-10-CM | POA: Diagnosis not present

## 2014-11-27 ENCOUNTER — Telehealth: Payer: Self-pay

## 2014-11-27 NOTE — Telephone Encounter (Signed)
-----   Message from Garvin Fila, MD sent at 11/27/2014  4:34 PM EDT ----- Mitchell Heir inform the patient that carotid ultrasound study done on 11/21/14 was normal

## 2014-11-27 NOTE — Telephone Encounter (Signed)
Patient returned call

## 2014-11-27 NOTE — Telephone Encounter (Signed)
Rn return patients phone call to notify her that the carotid ultrasound study was normal. Pt verbalized understanding.PT ask if she still needed to come to her appt tomorrow with Megan(NP) and I stated yes, and to arrive at 1015 for check in. Pt stated she will be here tomorrow.

## 2014-11-27 NOTE — Telephone Encounter (Signed)
Lft vm for patient to call back that about her carotid ultrasound study results.

## 2014-11-28 ENCOUNTER — Ambulatory Visit (INDEPENDENT_AMBULATORY_CARE_PROVIDER_SITE_OTHER): Payer: Medicare Other | Admitting: Adult Health

## 2014-11-28 ENCOUNTER — Encounter: Payer: Self-pay | Admitting: Adult Health

## 2014-11-28 VITALS — BP 105/72 | HR 93 | Ht 64.0 in | Wt 149.5 lb

## 2014-11-28 DIAGNOSIS — R51 Headache: Secondary | ICD-10-CM | POA: Diagnosis not present

## 2014-11-28 DIAGNOSIS — R519 Headache, unspecified: Secondary | ICD-10-CM

## 2014-11-28 DIAGNOSIS — W19XXXD Unspecified fall, subsequent encounter: Secondary | ICD-10-CM

## 2014-11-28 DIAGNOSIS — R569 Unspecified convulsions: Secondary | ICD-10-CM | POA: Diagnosis not present

## 2014-11-28 DIAGNOSIS — Z8673 Personal history of transient ischemic attack (TIA), and cerebral infarction without residual deficits: Secondary | ICD-10-CM

## 2014-11-28 NOTE — Patient Instructions (Signed)
Continue Aspirin for stroke prevention Maintain good control of blood pressure goal <130/90 Good control of cholesterol LDL <100 We will repeat CT scan of the brain If your symptoms worsen or you develop new symptoms please let us know.

## 2014-11-28 NOTE — Progress Notes (Signed)
PATIENT: Anna Lyons DOB: 02-09-1955  REASON FOR VISIT: follow up- stroke, seizures HISTORY FROM: patient  HISTORY OF PRESENT ILLNESS: Anna Lyons is a 59 year old female with a history of stroke. She returns today for follow-up. The patient continues on aspirin for stroke prevention. She denies any significant bruising or bleeding. The patient's blood pressure and cholesterol has been managed by her primary care provider. Her blood pressure is in good control today. The patient continues to use baclofen for spasticity and she reports that this is working well. Her primary care provides her with Klonopin to help with restless leg symptoms, she was most recently  started on Mirapex in addition to the [place]. Patient reports that since the last visit she had a fall. This occurred in September. She states that she does not recall any events surrounding the fall other than that she was going to the bathroom and apparently  fell and hit her head. She did call her daughter who came and took her to the emergency room. Patient states that she has  trouble with her balance and she did not use her walker when she went to the bathroom. She states that in the emergency room she was diagnosed with a concussion and had a hematoma from the fall. Since then the patient has continued to have a headache daily. She is also very sleepy throughout the day. She denies any other symptoms. She denies any seizure events. She is continued to take Keppra 250 mg twice a day. After her fall she denies biting her tongue or loss of bowels or bladder. She returns today for an evaluation.  HISTORY 05/14/14:UPDATE 10/19/13 (LL): Patient returns for stroke followup accompanied by her daughter. She reports that she had stroke like symptoms in July and was admitted to [facility]. Stroke workup was negative and was found to have a UTI. She started back smoking. She is having increased fatigue, stating she feels like sleeping all day. She claims  she does not sleep soundly at night and never wakes up feeling refreshed. Her daughter states she snores loudly. Blood pressure is well controlled, it is 105/66 in the office today. Dr. Arelia Sneddon is treating her RLS with Klonopin, which she states relieves her symptoms. She is on her last day of Amoxicillin for left ear infection, but states she feels as though she cannot hear; everything sounds muffled. She is tolerating aspirin well with no signs of significant bleeding or bruising.  UPDATE 04/04/13 (LL): Patient comes in for revisit. She has had increased pain in right shoulder and very decreased ROM, and feels like she is dragging her right foot, but denies falls. States she feels like she lays in bed all day due to the pain in her right shoulder, then cannot sleep at night. Denies falls since last visit. BP is well controlled, is 111/71 in office today. Tolerating Keppra and aspirin well without side effects.  UPDATE 12/01/12 (LL): Anna Lyons comes to office for stroke revisit. She reports that she has been having increased trouble with balance in the last couple months, with 3-4 falls in the last month which sent her to the ER. Repeat MRI did not show new stroke. She states she feels like she is going to the side when she walks. She has increased pain in right shoulder and decreased ROM, and feels like she is dragging her right foot sometimes. She is suffering with insomnia, waking frequently through the night, and was prescribed Clonazepam by her PCP. Her depression is  much better since last visit.  UPDATE 08/28/12 (LL): Anna Lyons returns to office for follow up of seizures (2) which occurred on 05/19/12. They occurred while patient was staying with sick husband in hospital, was sleep deprived and stressed out. Husband subsequently died one month ago. Patient has not had anymore seizures, is maintained on Keppra 500mg  BID. No new neurovascular symptoms. Not sleeping well per daughter, patient has been  very depressed since husband's death. Zoloft was increased by PCP. RLS symptoms worse, needs refill on Requip. Dr. Arelia Sneddon manages HTN and cholesterol, patient does not know last lab values. Daughter reports BP med was recently changed due to hypokalemia. Patient reportedly stating smoking again when husband was terminal with cancer. Plans on quitting asap. Patient c/o right shoulder pain, weak from stroke and exacerbated by moving/carrying boxes.  Anna Lyons is a 58 y.o. 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 The first happened on the floor of the hospital that she was visiting her husband who was a patient. She is staying with him and was incontinent of both stool and urine and had what was reported to be should shaking activity. She was then taken to the emergency room where she was witnessed to have right head and eye deviation followed by tonic-clonic activity. She was given Ativan and Versed. MRI showed only evidence of a prior CVA, and EEG was unremarkable. The patient's seizure was likely a combination of a lowered seizure threshold, due to sleep deprivation in the context of visiting her hospitalized husband, and a predisposition to seizures, possibly related to her old CVA. The patient was started on Keppra, with no further seizure activity during the hospitalization.  02/17/12 PRIOR HPI (PS): 59 year old female was last seen in hospital for stroke or March of 2013. At that time she received TPA. Workup showed normal carotid Dopplers, 2-D echo showed 55-60% EF, negative CT angiogram extracranial vessels, negative CT brain, and positive stroke in the left posterior lentiform nucleus seen on MRI. She was discharged on aspirin 325 mg at that time. She was doing well until 11/16/2011 a.m. She awoke at 8 AM in normal state. She ate breakfast at 10:30 and  noted she had difficulty chewing her food. She brushed her teeth at 11 AM and noticed a right facial droop. No other symptoms. Initial CT head showed no acute infarct or bleed. Facial droop was still present. During further exam her facial droop improved and she is now back to her baseline. Initial NIH SS of 2 for right facial droop and pronator drift. CT of the brain 11/16/2011 remote left basal ganglia lacunar infarction. Mild white matter changes as above. No definite CT evidence for acute intracranial abnormality. MRI of the brain Kober 15 2013 chronic left hemisphere infarction as described. Small foci of restricted diffusion in the left periventricular region could represent acute or chronic ischemia. Mild atrophy and mild chronic microvascular ischemic change. Chest x-ray 11/16/2011 cardiomegaly without congestive failure.  Update 05/14/2014 : She returns for follow-up after last visit 6 months ago. She is a complaint bad daughter. She remains in a logical stable without recurrent stroke or TIA symptoms. She states her blood pressure is under good control. Last lipid profile checked several months ago was fine. She remains on aspirin which is tolerating well without bleeding, bruising. She has not had any seizures and she left the  hospital. At last visit the [place] dose was reduced and she is tolerating well without breakthrough seizures. She remains on baclofen 20 mg 3 times daily which seems to help her spasticity and she is tolerating it well. She has noted increase in restless leg symptoms and she takes Klonopin 1 mg at night which seems to help. She has tried Requip in the past which has not work for her. She was recently changed from Zoloft to Prozac for depression and it seems to be helping. She has not had any carotid Dopplers done for nearly a year. She is able to walk with a wheeled walker and has had no recent falls.   REVIEW OF SYSTEMS: Out of a complete 14 system review of symptoms, the patient  complains only of the following symptoms, and all other reviewed systems are negative.  ALLERGIES: Allergies  Allergen Reactions  . Codeine Itching    HOME MEDICATIONS: Outpatient Prescriptions Prior to Visit  Medication Sig Dispense Refill  . aspirin EC 325 MG tablet Take 325 mg by mouth daily.    [name] atorvastatin (LIPITOR) 80 MG tablet TAKE ONE TABLET BY MOUTH ONCE DAILY 90 tablet 0  . baclofen (LIORESAL) 20 MG tablet TAKE ONE-HALF TABLET BY MOUTH THREE TIMES DAILY FOR  ONE  WEEK,  THEN  INCREASE  TO  TAKE  ONE  TABLET  BY  MOUTH  THREE  TIMES  DAILY 90 tablet 6  . carbamide peroxide (DEBROX) 6.5 % otic solution Place 5 drops into the left ear 2 (two) times daily. 15 mL 0  . clonazePAM (KLONOPIN) 1 MG tablet Take 1 tablet (1 mg total) by mouth at bedtime. 30 tablet 1  . diclofenac sodium (VOLTAREN) 1 % GEL Apply 2 g topically 4 (four) times daily. 100 g 3  . DULoxetine (CYMBALTA) 60 MG capsule Take 1 capsule (60 mg total) by mouth daily. 30 capsule 6  . hydrOXYzine (VISTARIL) 25 MG capsule Take 25 mg by mouth daily as needed for anxiety.    . levETIRAcetam (KEPPRA) 500 MG tablet One half tablet (250mg ) twice daily 90 tablet 0  . Multiple Vitamin (MULTIVITAMIN) capsule Take 1 capsule by mouth daily.     . ondansetron (ZOFRAN) 4 MG tablet Take 1 tablet (4 mg total) by mouth every 8 (eight) hours as needed for nausea or vomiting. 6 tablet 0  . pramipexole (MIRAPEX) 0.5 MG tablet Take 1 tablet (0.5 mg total) by mouth at bedtime as needed. 30 tablet 1  . traMADol-acetaminophen (ULTRACET) 37.5-325 MG per tablet Take 1 tablet by mouth every 8 (eight) hours as needed. (Patient taking differently: Take 1 tablet by mouth every 8 (eight) hours as needed for moderate pain. ) 30 tablet 0  . triamterene-hydrochlorothiazide (MAXZIDE) 75-50 MG per tablet Take 0.5 tablets by mouth daily. 45 tablet 3   No facility-administered medications prior to visit.    PAST MEDICAL HISTORY: Past Medical History    Diagnosis Date  . Depression   . Hypertension   . Angina   . Restless leg syndrome   . TIA (transient ischemic attack) 08/2010  . Stroke ([place]) 04/20/2011    a. 04/20/11 Left subcortical infarct treated w/ TPA;  b. 04/2011 Echo: EF 55-60%;  c. 04/2011 Normal Carotid u/s  d. Residual "walk w/limp on right; unable to grasp w/right hand"  . Tobacco abuse     a. quit @ time of CVA 04/2011.  [name] RLS (restless legs syndrome) 11/16/2011  . Seizures ([place])  PAST SURGICAL HISTORY: Past Surgical History  Procedure Laterality Date  . Mastectomy  1980's    bilateral with reconstruction  . Breast reconstruction      bilaterally  . Cesarean section  1979  . Knee arthroscopy  1990's    left; cartilage repair  . Carpal tunnel release  1990's    left  . Left heart catheterization with coronary angiogram N/A 05/20/2011    Procedure: LEFT HEART CATHETERIZATION WITH CORONARY ANGIOGRAM;  Surgeon: Josue Hector, MD;  Location: [facility] CATH LAB;  Service: Cardiovascular;  Laterality: N/A;    FAMILY HISTORY: Family History  Problem Relation Age of Onset  . Lupus Mother     died early 9's.  . Emphysema Father     died late 37's.  [name] COPD Father     SOCIAL HISTORY: Social History   Social History  . Marital Status: Widowed    Spouse Name: N/A  . Number of Children: 1  . Years of Education: 12   Occupational History  . disabled    Social History Main Topics  . Smoking status: Current Every Day Smoker -- 0.50 packs/day for 37 years    Types: Cigarettes  . Smokeless tobacco: Never Used     Comment: quit 04/20/2011, restarted in May '14  . Alcohol Use: Yes     Comment: very occasional  . Drug Use: No  . Sexual Activity: No   Other Topics Concern  . Not on file   Social History Narrative   Pt lives alone.   Caffeine Use: 1-3 cups of caffeine daily (tea)      PHYSICAL EXAM  Filed Vitals:   11/28/14 1056  BP: 105/72  Pulse: 93  Height: 5\' 4"  (1.626 m)  Weight: 149 lb 8 oz (67.813  kg)   Body mass index is 25.65 kg/(m^2).  Generalized: Well developed, in no acute distress   Neurological examination  Mentation: Alert oriented to time, place, history taking. Follows all commands speech and language fluent Cranial nerve II-XII: Pupils were equal round reactive to light. Extraocular movements were full, visual field were full on confrontational test. Facial sensation and strength were normal. Uvula tongue midline. Head turning and shoulder shrug  were normal and symmetric. Motor: The motor testing reveals 5 over 5 strength of all 4 extremities except some mild weakness in RLE. Good symmetric motor tone is noted throughout.  Sensory: Sensory testing is intact to soft touch on all 4 extremities. No evidence of extinction is noted.  Coordination: Cerebellar testing reveals good finger-nose-finger and heel-to-shin bilaterally.  Gait and station: Patient walks with a slight limp on the right. Tandem gait slightly unsteady. Romberg negative Reflexes: Deep tendon reflexes are symmetric and normal bilaterally.   DIAGNOSTIC DATA (LABS, IMAGING, TESTING) - I reviewed patient records, labs, notes, testing and imaging myself where available.  Lab Results  Component Value Date   WBC 4.6 10/10/2014   HGB 12.2 10/10/2014   HCT 36.0 10/10/2014   MCV 89.1 10/10/2014   PLT 187 10/10/2014      Component Value Date/Time   NA 134* 10/10/2014 1031   K 3.2* 10/10/2014 1031   CL 94* 10/10/2014 1031   CO2 33* 08/28/2014 1430   GLUCOSE 82 10/10/2014 1031   BUN 7 10/10/2014 1031   CREATININE 0.60 10/10/2014 1031   CALCIUM 9.7 08/28/2014 1430   PROT 7.6 08/28/2014 1430   ALBUMIN 4.2 08/28/2014 1430   AST 21 08/28/2014 1430   ALT 14 08/28/2014 1430  ALKPHOS 76 08/28/2014 1430   BILITOT 0.7 08/28/2014 1430   GFRNONAA >90 04/09/2014 1446   GFRAA >90 04/09/2014 1446   Lab Results  Component Value Date   CHOL 132 12/20/2013   HDL 46.30 12/20/2013   LDLCALC 58 12/20/2013   TRIG  139.0 12/20/2013   CHOLHDL 3 12/20/2013   Lab Results  Component Value Date   HGBA1C 5.8 12/20/2013   No results found for: [id number] Lab Results  Component Value Date   TSH 1.778 05/19/2012      ASSESSMENT AND PLAN 59 y.o. year old female  has a past medical history of Depression; Hypertension; Angina; Restless leg syndrome; TIA (transient ischemic attack) (08/2010); Stroke [facility]) (04/20/2011); Tobacco abuse; RLS (restless legs syndrome) (11/16/2011); and Seizures ([place]). here with:  1. History of stroke 2. Seizures 3. Fall 4. Headache  Since last visit the patient  suffered a fall. Since the fall she has continued to have a daily headache as well as daytime sleepiness. I will repeat a CT scan to look for any acute changes. Patient will continue on aspirin for stroke prevention. She is encouraged to maintain good control of her blood pressure keeping her blood pressure less than 130/90. Patient should keep her cholesterol LDL less than 100. Patient will continue on Keppra for seizure prevention. Patient advised that if she has any seizure event she should let us know. She will follow-up in 6 months with Dr. Leonie Man.   Ward Givens, MSN, NP-C 11/28/2014, 12:20 PM [facility] [address] [place], [place] [address] [phone]

## 2014-11-28 NOTE — Progress Notes (Signed)
I agree with the above plan 

## 2014-12-09 ENCOUNTER — Ambulatory Visit: Payer: Medicare Other | Admitting: Internal Medicine

## 2015-01-02 ENCOUNTER — Encounter (INDEPENDENT_AMBULATORY_CARE_PROVIDER_SITE_OTHER): Payer: Medicare Other | Admitting: Ophthalmology

## 2015-01-02 DIAGNOSIS — H2513 Age-related nuclear cataract, bilateral: Secondary | ICD-10-CM

## 2015-01-02 DIAGNOSIS — H43813 Vitreous degeneration, bilateral: Secondary | ICD-10-CM

## 2015-01-02 DIAGNOSIS — H35033 Hypertensive retinopathy, bilateral: Secondary | ICD-10-CM

## 2015-01-02 DIAGNOSIS — I10 Essential (primary) hypertension: Secondary | ICD-10-CM

## 2015-01-08 ENCOUNTER — Other Ambulatory Visit: Payer: Self-pay | Admitting: Neurology

## 2015-01-10 ENCOUNTER — Telehealth: Payer: Self-pay | Admitting: Neurology

## 2015-01-10 ENCOUNTER — Other Ambulatory Visit: Payer: Self-pay

## 2015-01-10 MED ORDER — LEVETIRACETAM 500 MG PO TABS: 250.0000 mg | ORAL_TABLET | Freq: Two times a day (BID) | ORAL | Status: DC

## 2015-01-10 NOTE — Telephone Encounter (Signed)
Routed to Dr. Leonie Man.

## 2015-01-10 NOTE — Telephone Encounter (Signed)
Pt needs refill on levETIRAcetam (KEPPRA) 500 MG tablet. Thank you

## 2015-02-12 ENCOUNTER — Other Ambulatory Visit: Payer: Self-pay | Admitting: Internal Medicine

## 2015-03-05 ENCOUNTER — Encounter: Payer: Self-pay | Admitting: Nurse Practitioner

## 2015-03-05 ENCOUNTER — Telehealth: Payer: Self-pay

## 2015-03-05 ENCOUNTER — Ambulatory Visit (INDEPENDENT_AMBULATORY_CARE_PROVIDER_SITE_OTHER): Payer: Medicare Other | Admitting: Nurse Practitioner

## 2015-03-05 ENCOUNTER — Encounter (INDEPENDENT_AMBULATORY_CARE_PROVIDER_SITE_OTHER): Payer: Medicare Other | Admitting: Ophthalmology

## 2015-03-05 ENCOUNTER — Other Ambulatory Visit (INDEPENDENT_AMBULATORY_CARE_PROVIDER_SITE_OTHER): Payer: Medicare Other

## 2015-03-05 VITALS — BP 128/78 | HR 86 | Temp 97.9°F | Ht 64.0 in | Wt 151.2 lb

## 2015-03-05 DIAGNOSIS — R202 Paresthesia of skin: Secondary | ICD-10-CM

## 2015-03-05 LAB — COMPREHENSIVE METABOLIC PANEL
ALBUMIN: 4.3 g/dL (ref 3.5–5.2)
ALK PHOS: 74 U/L (ref 39–117)
ALT: 21 U/L (ref 0–35)
AST: 29 U/L (ref 0–37)
BUN: 14 mg/dL (ref 6–23)
CALCIUM: 10.1 mg/dL (ref 8.4–10.5)
CHLORIDE: 101 meq/L (ref 96–112)
CO2: 28 meq/L (ref 19–32)
CREATININE: 0.7 mg/dL (ref 0.40–1.20)
GFR: 90.39 mL/min (ref >60.00)
Glucose, Bld: 96 mg/dL (ref 70–99)
POTASSIUM: 3.5 meq/L (ref 3.5–5.1)
Sodium: 139 mEq/L (ref 135–145)
Total Bilirubin: 0.6 mg/dL (ref 0.2–1.2)
Total Protein: 7.8 g/dL (ref 6.0–8.3)

## 2015-03-05 LAB — HEMOGLOBIN A1C: Hgb A1c MFr Bld: 5.8 % (ref 4.6–6.5)

## 2015-03-05 LAB — VITAMIN B12: VITAMIN B 12: 537 pg/mL (ref 211–911)

## 2015-03-05 NOTE — Progress Notes (Signed)
Pre visit review using our clinic review tool, if applicable. No additional management support is needed unless otherwise documented below in the visit note. 

## 2015-03-05 NOTE — Telephone Encounter (Signed)
Pt come in for an appt today and stated that she had her flu shot at her last visit here.

## 2015-03-05 NOTE — Patient Instructions (Addendum)
Please call your neurology office and see if they can get you in earlier than April.   Try to find diabetic socks- can find at [employer] and at medical supply stores or uniform shops.   Please visit the lab today

## 2015-03-05 NOTE — Progress Notes (Signed)
Patient ID: Anna Lyons, female    DOB: 04/26/1955  Age: 60 y.o. MRN: [id number]  CC: Foot Problem   HPI Anna Lyons presents for CC of two weeks of worsening foot pain.  1) Worsening Toes feel cold, sting, and burn  Toes and ball of foot  Hurts  Bilaterally  Dry mouth Pruritic skin and head  Feels like something biting her on her legs- no fleas on dog per vet  Decaff tea, coffee- 1 cup, water small amount   Treatement- advil last night- helpful   History Anna Lyons has a past medical history of Depression; Hypertension; Angina; Restless leg syndrome; TIA (transient ischemic attack) (08/2010); Stroke [facility]) (04/20/2011); Tobacco abuse; RLS (restless legs syndrome) (11/16/2011); and Seizures ([place]).   She has past surgical history that includes Mastectomy (1980's); Breast reconstruction; Cesarean section (1979); Knee arthroscopy (1990's); Carpal tunnel release (1990's); and left heart catheterization with coronary angiogram (N/A, 05/20/2011).   Her family history includes COPD in her father; Emphysema in her father; Lupus in her mother.She reports that she has been smoking Cigarettes.  She has a 18.5 pack-year smoking history. She has never used smokeless tobacco. She reports that she drinks alcohol. She reports that she does not use illicit drugs.  Outpatient Prescriptions Prior to Visit  Medication Sig Dispense Refill  . aspirin EC 325 MG tablet Take 325 mg by mouth daily.    [name] atorvastatin (LIPITOR) 80 MG tablet TAKE ONE TABLET BY MOUTH ONCE DAILY 90 tablet 0  . baclofen (LIORESAL) 20 MG tablet TAKE ONE-HALF TABLET BY MOUTH THREE TIMES DAILY FOR  ONE  WEEK,  THEN  INCREASE  TO  TAKE  ONE  TABLET  BY  MOUTH  THREE  TIMES  DAILY 90 tablet 6  . carbamide peroxide (DEBROX) 6.5 % otic solution Place 5 drops into the left ear 2 (two) times daily. 15 mL 0  . clonazePAM (KLONOPIN) 1 MG tablet Take 1 tablet (1 mg total) by mouth at bedtime. 30 tablet 1  . diclofenac sodium (VOLTAREN) 1 % GEL Apply 2  g topically 4 (four) times daily. 100 g 3  . DULoxetine (CYMBALTA) 60 MG capsule Take 1 capsule (60 mg total) by mouth daily. 30 capsule 6  . hydrOXYzine (VISTARIL) 25 MG capsule Take 25 mg by mouth daily as needed for anxiety.    . levETIRAcetam (KEPPRA) 500 MG tablet Take 1 tablet (500 mg total) by mouth 2 (two) times daily. 90 tablet 1  . Multiple Vitamin (MULTIVITAMIN) capsule Take 1 capsule by mouth daily.     . ondansetron (ZOFRAN) 4 MG tablet Take 1 tablet (4 mg total) by mouth every 8 (eight) hours as needed for nausea or vomiting. 6 tablet 0  . pramipexole (MIRAPEX) 0.5 MG tablet Take 1 tablet (0.5 mg total) by mouth at bedtime as needed. 30 tablet 1  . traMADol-acetaminophen (ULTRACET) 37.5-325 MG per tablet Take 1 tablet by mouth every 8 (eight) hours as needed. (Patient taking differently: Take 1 tablet by mouth every 8 (eight) hours as needed for moderate pain. ) 30 tablet 0  . triamterene-hydrochlorothiazide (MAXZIDE) 75-50 MG per tablet Take 0.5 tablets by mouth daily. 45 tablet 3   No facility-administered medications prior to visit.    ROS Review of Systems  Constitutional: Negative for fever, chills, diaphoresis and fatigue.  Respiratory: Negative for chest tightness, shortness of breath and wheezing.   Cardiovascular: Negative for chest pain, palpitations and leg swelling.  Gastrointestinal: Negative for nausea, vomiting and  diarrhea.  Endocrine: Positive for polydipsia. Negative for polyphagia and polyuria.  Musculoskeletal: Positive for myalgias, arthralgias and gait problem. Negative for back pain, joint swelling, neck pain and neck stiffness.  Skin: Negative for rash.  Neurological: Positive for numbness. Negative for dizziness, weakness and headaches.  Psychiatric/Behavioral: The patient is not nervous/anxious.     Objective:  BP 128/78 mmHg  Pulse 86  Temp(Src) 97.9 F (36.6 C) (Oral)  Ht _0  (1.626 m)  Wt 151 lb 4 oz (68.607 kg)  BMI 25.95 kg/m2  SpO2  95%  Physical Exam  Constitutional: She is oriented to person, place, and time. She appears well-developed and well-nourished. No distress.  HENT:  Head: Normocephalic and atraumatic.  Right Ear: External ear normal.  Left Ear: External ear normal.  Eyes: Right eye exhibits no discharge. Left eye exhibits no discharge. No scleral icterus.  Cardiovascular: Normal rate, regular rhythm, normal heart sounds and intact distal pulses.  Exam reveals no gallop and no friction rub.   No murmur heard. Pulmonary/Chest: Effort normal and breath sounds normal. No respiratory distress. She has no wheezes. She has no rales. She exhibits no tenderness.  Neurological: She is alert and oriented to person, place, and time. No cranial nerve deficit. She exhibits normal muscle tone. Coordination normal.  Intact sensation of feet and toes bilaterally, pt reports it feels dull Gait is normal, intact toe/heel/sequential walking  Skin: Skin is warm and dry. No rash noted. She is not diaphoretic.  Psychiatric: She has a normal mood and affect. Her behavior is normal. Judgment and thought content normal.      Assessment & Plan:   Anna Lyons was seen today for foot problem.  Diagnoses and all orders for this visit:  Paresthesia of both feet -     Comp Met (CMET); Future -     HgB A1c; Future -     B12; Future   I am having Anna Lyons maintain her aspirin EC, hydrOXYzine, diclofenac sodium, carbamide peroxide, multivitamin, triamterene-hydrochlorothiazide, baclofen, traMADol-acetaminophen, ondansetron, clonazePAM, DULoxetine, pramipexole, levETIRAcetam, and atorvastatin.  No orders of the defined types were placed in this encounter.     Follow-up: Return if symptoms worsen or fail to improve.

## 2015-03-06 ENCOUNTER — Encounter: Payer: Self-pay | Admitting: Geriatric Medicine

## 2015-03-06 NOTE — Telephone Encounter (Signed)
Charted.

## 2015-03-09 ENCOUNTER — Encounter: Payer: Self-pay | Admitting: Nurse Practitioner

## 2015-03-09 DIAGNOSIS — R202 Paresthesia of skin: Secondary | ICD-10-CM | POA: Insufficient documentation

## 2015-03-09 NOTE — Assessment & Plan Note (Addendum)
New problem Will check B12, C met, and A1c today Symptoms occur equally bilaterally per patient Advised her to follow-up with her neurologist sooner than April if worsening.  Asked her to try compression hose and she would rather wear the sock version diabetic socks. I encouraged this and asked her to try a pair to see if it gives her any relief.

## 2015-04-23 ENCOUNTER — Telehealth: Payer: Self-pay | Admitting: Neurology

## 2015-04-23 NOTE — Telephone Encounter (Signed)
Rn call patient back about her feet burning and having pain.Rn stated there is a office note from February 2017 that she was seen by Marijean Bravo the NP for paresthesia of both feet. Patient stated she did see her NP at the PCP office, but they told her it could be a neurological problem. Rn stated a message will be sent to [place]. Patient has an appt in April 2017.

## 2015-04-23 NOTE — Telephone Encounter (Signed)
Pt called complaining of extreme burning and pain in her feet. 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 She would like to know if there is something she can have for the pain and burning. Please call and advise [phone]

## 2015-04-28 NOTE — Telephone Encounter (Addendum)
Rn call patient back about her feet and the paresthesia . Rn stated per Dr.Sethi this is a chronic problem for the patient. PT stated she has seen her foot doctor and PCP for the feet issues. Rn stated Dr.Sethi saw 05/2014. Pt was last seen by [facility] NP in 11/2014. Pt will contact her PCP again. Pt will be seeing her foot doctor in the next week.

## 2015-04-28 NOTE — Telephone Encounter (Signed)
Seems like this is a chronic problem and can be addressed in her upcoming visit in April with me

## 2015-05-07 ENCOUNTER — Encounter: Payer: Self-pay | Admitting: Physical Medicine & Rehabilitation

## 2015-05-15 ENCOUNTER — Telehealth: Payer: Self-pay

## 2015-05-15 NOTE — Telephone Encounter (Signed)
Call to Ms. Brownlow to fup on AWV; Stated that she needed an apt with Dr. 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 Recommended she try to take q 8 hour to see if this helps. 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 Seeing Dr. Leonie Man on 24th;  Declines apt with you on Tues at 8:15; states she can't get here that early   Do you have other recommendations;

## 2015-05-19 NOTE — Telephone Encounter (Signed)
Given her many medicines would recommend to schedule with [place] or wait for neurology visit.

## 2015-05-20 ENCOUNTER — Other Ambulatory Visit: Payer: Self-pay | Admitting: Neurology

## 2015-05-20 ENCOUNTER — Other Ambulatory Visit: Payer: Self-pay | Admitting: Internal Medicine

## 2015-05-21 ENCOUNTER — Telehealth: Payer: Self-pay | Admitting: Internal Medicine

## 2015-05-21 ENCOUNTER — Other Ambulatory Visit: Payer: Self-pay

## 2015-05-21 DIAGNOSIS — R102 Pelvic and perineal pain: Secondary | ICD-10-CM

## 2015-05-21 DIAGNOSIS — N949 Unspecified condition associated with female genital organs and menstrual cycle: Secondary | ICD-10-CM

## 2015-05-21 MED ORDER — BACLOFEN 20 MG PO TABS: ORAL_TABLET | ORAL | Status: DC

## 2015-05-21 NOTE — Telephone Encounter (Signed)
Pt request referral for OBGYN. Please call pt  # [phone]

## 2015-05-22 NOTE — Telephone Encounter (Signed)
Referral placed.

## 2015-05-22 NOTE — Telephone Encounter (Signed)
Call to the patient to fup on meds for pain; will wait until she see's neuro on the 24th.  Discussed OBGYN; Stated she had seen Dr. Arelia Sneddon in [place] in the past but does not want to go to him. States she has "bumps" on perineum and "boil".  Will need referral to any ob-gyn in [place];

## 2015-05-22 NOTE — Telephone Encounter (Signed)
What is the reason for referral? Does she want to see a particular group or provider?

## 2015-05-26 ENCOUNTER — Ambulatory Visit (INDEPENDENT_AMBULATORY_CARE_PROVIDER_SITE_OTHER): Payer: Medicare Other | Admitting: Neurology

## 2015-05-26 ENCOUNTER — Encounter: Payer: Self-pay | Admitting: Neurology

## 2015-05-26 VITALS — BP 117/79 | HR 78 | Ht 64.0 in | Wt 148.8 lb

## 2015-05-26 DIAGNOSIS — R202 Paresthesia of skin: Secondary | ICD-10-CM

## 2015-05-26 MED ORDER — DULOXETINE HCL 60 MG PO CPEP: 60.0000 mg | ORAL_CAPSULE | Freq: Two times a day (BID) | ORAL | Status: DC

## 2015-05-26 NOTE — Progress Notes (Signed)
PATIENT: Anna Lyons DOB: 1955/04/25  REASON FOR VISIT: follow up- stroke, seizures HISTORY FROM: patient  HISTORY OF PRESENT ILLNESS: Anna Lyons is a 60 year old female with a history of stroke. She returns today for follow-up. The patient continues on aspirin for stroke prevention. She denies any significant bruising or bleeding. The patient's blood pressure and cholesterol has been managed by her primary care provider. Her blood pressure is in good control today. The patient continues to use baclofen for spasticity and she reports that this is working well. Her primary care provides her with Klonopin to help with restless leg symptoms, she was most recently  started on Mirapex in addition to the Cob[place]. Patient reports that since the last visit she had a fall. This occurred in September. She states that she does not recall any events surrounding the fall other than that she was going to the bathroom and apparently  fell and hit her head. She did call her daughter who came and took her to the emergency room. Patient states that she has  trouble with her balance and she did not use her walker when she went to the bathroom. She states that in the emergency room she was diagnosed with a concussion and had a hematoma from the fall. Since then the patient has continued to have a headache daily. She is also very sleepy throughout the day. She denies any other symptoms. She denies any seizure events. She is continued to take Keppra 250 mg twice a day. After her fall she denies biting her tongue or loss of bowels or bladder. She returns today for an evaluation.  HISTORY 05/14/14:UPDATE 10/19/13 (LL): Patient returns for stroke followup accompanied by her daughter. She reports that she had stroke like symptoms in July and was admitted to [facility]. Stroke workup was negative and was found to have a UTI. She started back smoking. She is having increased fatigue, stating she feels like sleeping all day. She claims  she does not sleep soundly at night and never wakes up feeling refreshed. Her daughter states she snores loudly. Blood pressure is well controlled, it is 105/66 in the office today. Dr. Arelia Sneddon is treating her RLS with Klonopin, which she states relieves her symptoms. She is on her last day of Amoxicillin for left ear infection, but states she feels as though she cannot hear; everything sounds muffled. She is tolerating aspirin well with no signs of significant bleeding or bruising.  UPDATE 04/04/13 (LL): Patient comes in for revisit. She has had increased pain in right shoulder and very decreased ROM, and feels like she is dragging her right foot, but denies falls. States she feels like she lays in bed all day due to the pain in her right shoulder, then cannot sleep at night. Denies falls since last visit. BP is well controlled, is 111/71 in office today. Tolerating Keppra and aspirin well without side effects.  UPDATE 12/01/12 (LL): Anna Lyons comes to office for stroke revisit. She reports that she has been having increased trouble with balance in the last couple months, with 3-4 falls in the last month which sent her to the ER. Repeat MRI did not show new stroke. She states she feels like she is going to the side when she walks. She has increased pain in right shoulder and decreased ROM, and feels like she is dragging her right foot sometimes. She is suffering with insomnia, waking frequently through the night, and was prescribed Clonazepam by her PCP. Her depression is  much better since last visit.  UPDATE 08/28/12 (LL): [name] returns to office for follow up of seizures (2) which occurred on 05/19/12. They occurred while patient was staying with sick husband in hospital, was sleep deprived and stressed out. Husband subsequently died one month ago. Patient has not had anymore seizures, is maintained on Keppra 500mg  BID. No new neurovascular symptoms. Not sleeping well per daughter, patient has been  very depressed since husband's death. Zoloft was increased by PCP. RLS symptoms worse, needs refill on Requip. Dr. Arelia Sneddon manages HTN and cholesterol, patient does not know last lab values. Daughter reports BP med was recently changed due to hypokalemia. Patient reportedly stating smoking again when husband was terminal with cancer. Plans on quitting asap. Patient c/o right shoulder pain, weak from stroke and exacerbated by moving/carrying boxes.  Anna Lyons is a 60 y.o. 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 The first happened on the floor of the hospital that she was visiting her husband who was a patient. She is staying with him and was incontinent of both stool and urine and had what was reported to be should shaking activity. She was then taken to the emergency room where she was witnessed to have right head and eye deviation followed by tonic-clonic activity. She was given Ativan and Versed. MRI showed only evidence of a prior CVA, and EEG was unremarkable. The patient's seizure was likely a combination of a lowered seizure threshold, due to sleep deprivation in the context of visiting her hospitalized husband, and a predisposition to seizures, possibly related to her old CVA. The patient was started on Keppra, with no further seizure activity during the hospitalization.  02/17/12 PRIOR HPI (PS): 60 year old female was last seen in hospital for stroke or March of 2013. At that time she received TPA. Workup showed normal carotid Dopplers, 2-D echo showed 55-60% EF, negative CT angiogram extracranial vessels, negative CT brain, and positive stroke in the left posterior lentiform nucleus seen on MRI. She was discharged on aspirin 325 mg at that time. She was doing well until 11/16/2011 a.m. She awoke at 8 AM in normal state. She ate breakfast at 10:30 and  noted she had difficulty chewing her food. She brushed her teeth at 11 AM and noticed a right facial droop. No other symptoms. Initial CT head showed no acute infarct or bleed. Facial droop was still present. During further exam her facial droop improved and she is now back to her baseline. Initial NIH SS of 2 for right facial droop and pronator drift. CT of the brain 11/16/2011 remote left basal ganglia lacunar infarction. Mild white matter changes as above. No definite CT evidence for acute intracranial abnormality. MRI of the brain Kober 15 2013 chronic left hemisphere infarction as described. Small foci of restricted diffusion in the left periventricular region could represent acute or chronic ischemia. Mild atrophy and mild chronic microvascular ischemic change. Chest x-ray 11/16/2011 cardiomegaly without congestive failure.  Update 05/14/2014 : She returns for follow-up after last visit 6 months ago. She is a complaint bad daughter. She remains in a logical stable without recurrent stroke or TIA symptoms. She states her blood pressure is under good control. Last lipid profile checked several months ago was fine. She remains on aspirin which is tolerating well without bleeding, bruising. She has not had any seizures and she left the  hospital. At last visit the [place] dose was reduced and she is tolerating well without breakthrough seizures. She remains on baclofen 20 mg 3 times daily which seems to help her spasticity and she is tolerating it well. She has noted increase in restless leg symptoms and she takes Klonopin 1 mg at night which seems to help. She has tried Requip in the past which has not work for her. She was recently changed from Zoloft to Prozac for depression and it seems to be helping. She has not had any carotid Dopplers done for nearly a year. She is able to walk with a wheeled walker and has had no recent falls.  Update 05/26/2015 : She returns for follow-up after last visit 1 year ago with  nurse practitioner. Patient has a new complaint of worsening of her feet paresthesias particularly since she started a new part-time job in which she has to stay on her feet for 4 hours. She reports significant burning of her toes as well as the soles mostly in the right leg but the left leg also to lesser degree. She does have a diagnosis of restless leg syndrome and takes Klonopin and Mirapex at night but has not yet tried taking either medication during the daytime. She does admit to her depression be not adequately treated. She has been on Cymbalta 60 mg daily since the death of her husband 3 years ago. She has not had any falls or injuries. She has been taking baclofen 20 mg 3 times daily for her post stroke spasticity and walking difficulty and is tolerating it well without any side effects. She is worried that she may have peripheral neuropathy and has not had nerve conduction evaluation for that REVIEW OF SYSTEMS: Out of a complete 14 system review of symptoms, the patient complains only of the following symptoms, and all other reviewed systems are negative. Appetite change, restless leg, walking difficulty, numbness  ALLERGIES: Allergies  Allergen Reactions  . Codeine Itching    HOME MEDICATIONS: Outpatient Prescriptions Prior to Visit  Medication Sig Dispense Refill  . aspirin EC 325 MG tablet Take 325 mg by mouth daily.    [name] atorvastatin (LIPITOR) 80 MG tablet TAKE ONE TABLET BY MOUTH ONCE DAILY 90 tablet 0  . baclofen (LIORESAL) 20 MG tablet TAKE ONE-HALF TABLET BY MOUTH THREE TIMES DAILY FOR  ONE  WEEK,  THEN  INCREASE  TO  TAKE  ONE  TABLET  BY  MOUTH  THREE  TIMES  DAILY 90 tablet 0  . carbamide peroxide (DEBROX) 6.5 % otic solution Place 5 drops into the left ear 2 (two) times daily. 15 mL 0  . clonazePAM (KLONOPIN) 1 MG tablet Take 1 tablet (1 mg total) by mouth at bedtime. 30 tablet 1  . diclofenac sodium (VOLTAREN) 1 % GEL Apply 2 g topically 4 (four) times daily. 100 g 3  .  hydrOXYzine (VISTARIL) 25 MG capsule Take 25 mg by mouth daily as needed for anxiety.    . levETIRAcetam (KEPPRA) 500 MG tablet Take 1 tablet (500 mg total) by mouth 2 (two) times daily. 90 tablet 1  . Multiple Vitamin (MULTIVITAMIN) capsule Take 1 capsule by mouth daily.     . ondansetron (ZOFRAN) 4 MG tablet Take 1 tablet (4 mg total) by mouth every 8 (eight) hours as needed for nausea or vomiting. 6 tablet 0  . pramipexole (MIRAPEX) 0.5 MG tablet Take 1 tablet (0.5 mg total) by mouth at bedtime as needed. 30 tablet 1  .  triamterene-hydrochlorothiazide (MAXZIDE) 75-50 MG per tablet Take 0.5 tablets by mouth daily. 45 tablet 3  . DULoxetine (CYMBALTA) 60 MG capsule Take 1 capsule (60 mg total) by mouth daily. 30 capsule 6  . traMADol-acetaminophen (ULTRACET) 37.5-325 MG per tablet Take 1 tablet by mouth every 8 (eight) hours as needed. (Patient not taking: Reported on 05/26/2015) 30 tablet 0   No facility-administered medications prior to visit.    PAST MEDICAL HISTORY: Past Medical History  Diagnosis Date  . Depression   . Hypertension   . Angina   . Restless leg syndrome   . TIA (transient ischemic attack) 08/2010  . Stroke ([place]) 04/20/2011    a. 04/20/11 Left subcortical infarct treated w/ TPA;  b. 04/2011 Echo: EF 55-60%;  c. 04/2011 Normal Carotid u/s  d. Residual "walk w/limp on right; unable to grasp w/right hand"  . Tobacco abuse     a. quit @ time of CVA 04/2011.  [name] RLS (restless legs syndrome) 11/16/2011  . Seizures ([place])     PAST SURGICAL HISTORY: Past Surgical History  Procedure Laterality Date  . Mastectomy  1980's    bilateral with reconstruction  . Breast reconstruction      bilaterally  . Cesarean section  1979  . Knee arthroscopy  1990's    left; cartilage repair  . Carpal tunnel release  1990's    left  . Left heart catheterization with coronary angiogram N/A 05/20/2011    Procedure: LEFT HEART CATHETERIZATION WITH CORONARY ANGIOGRAM;  Surgeon: Josue Hector, MD;   Location: [facility] CATH LAB;  Service: Cardiovascular;  Laterality: N/A;    FAMILY HISTORY: Family History  Problem Relation Age of Onset  . Lupus Mother     died early 68's.  . Emphysema Father     died late 49's.  [name] COPD Father     SOCIAL HISTORY: Social History   Social History  . Marital Status: Widowed    Spouse Name: N/A  . Number of Children: 1  . Years of Education: 12   Occupational History  . disabled    Social History Main Topics  . Smoking status: Current Every Day Smoker -- 0.50 packs/day for 37 years    Types: Cigarettes  . Smokeless tobacco: Never Used     Comment: quit 04/20/2011, restarted in May '14  . Alcohol Use: 0.6 oz/week    0 Standard drinks or equivalent, 1 Glasses of wine per week     Comment: very occasional  . Drug Use: No  . Sexual Activity: No   Other Topics Concern  . Not on file   Social History Narrative   Pt lives alone.   Caffeine Use: 1-3 cups of caffeine daily (tea)      PHYSICAL EXAM  Filed Vitals:   05/26/15 1434  BP: 117/79  Pulse: 78  Height: 5\' 4"  (1.626 m)  Weight: 148 lb 12.8 oz (67.495 kg)   Body mass index is 25.53 kg/(m^2).  Generalized: Well developed, in no acute distress   Neurological examination  Mentation: Alert oriented to time, place, history taking. Follows all commands speech and language fluent Cranial nerve II-XII: Pupils were equal round reactive to light. Extraocular movements were full, visual field were full on confrontational test. Facial sensation and strength were normal. Uvula tongue midline. Head turning and shoulder shrug  were normal and symmetric. Motor: The motor testing reveals 5 over 5 strength of all 4 extremities except some mild weakness in RLE. Good symmetric motor tone is  noted throughout. Diminished fine finger movements on the right. Orbits left over right upper extremity. Sensory: Diminished touch and pinprick sensation in both feet over the toes only. Diminished vibration  sensation over toes bilaterally. Coordination: Cerebellar testing reveals good finger-nose-finger and heel-to-shin bilaterally.  Gait and station: Patient walks with a slight foot dragging on the right. Tandem gait slightly unsteady. Romberg negative Reflexes: Deep tendon reflexes are symmetric and normal bilaterally except diminished right ankle jerk.   DIAGNOSTIC DATA (LABS, IMAGING, TESTING) - I reviewed patient records, labs, notes, testing and imaging myself where available.  Lab Results  Component Value Date   WBC 4.6 10/10/2014   HGB 12.2 10/10/2014   HCT 36.0 10/10/2014   MCV 89.1 10/10/2014   PLT 187 10/10/2014      Component Value Date/Time   NA 139 03/05/2015 1210   K 3.5 03/05/2015 1210   CL 101 03/05/2015 1210   CO2 28 03/05/2015 1210   GLUCOSE 96 03/05/2015 1210   BUN 14 03/05/2015 1210   CREATININE 0.70 03/05/2015 1210   CALCIUM 10.1 03/05/2015 1210   PROT 7.8 03/05/2015 1210   ALBUMIN 4.3 03/05/2015 1210   AST 29 03/05/2015 1210   ALT 21 03/05/2015 1210   ALKPHOS 74 03/05/2015 1210   BILITOT 0.6 03/05/2015 1210   GFRNONAA >90 04/09/2014 1446   GFRAA >90 04/09/2014 1446   Lab Results  Component Value Date   CHOL 132 12/20/2013   HDL 46.30 12/20/2013   LDLCALC 58 12/20/2013   TRIG 139.0 12/20/2013   CHOLHDL 3 12/20/2013   Lab Results  Component Value Date   HGBA1C 5.8 03/05/2015   Lab Results  Component Value Date   [id number] 03/05/2015   Lab Results  Component Value Date   TSH 1.778 05/19/2012      ASSESSMENT AND PLAN 60 y.o. year old female  has a past medical history of Depression; Hypertension; Angina; Restless leg syndrome; TIA (transient ischemic attack) (08/2010); Stroke [facility]) (04/20/2011); Tobacco abuse; RLS (restless legs syndrome) (11/16/2011); and Seizures ([place]). here with:  1. History of stroke 2. Seizures 3. Fall 4. Headache I had a long d/w patient about her remote stroke, risk for recurrent stroke/TIAs, personally  independently reviewed imaging studies and stroke evaluation results and answered questions.Continue aspirin 325 mg daily  for secondary stroke prevention and maintain strict control of hypertension with blood pressure goal below 130/90, diabetes with hemoglobin A1c goal below 6.5% and lipids with LDL cholesterol goal below 70 mg/dL.. I also discussed her worsening of feet paresthesias which may represent restless leg syndrome versus neuropathy. Check EMG nerve conduction study. Increase dose of Cymbalta to 120 mg daily. To help with neuropathic pain as well as suboptimally treated depression She may use additional doses of Mirapex of Klonopin during the day if needed. She was advised to keep her appointment with her foot doctor and try wearing the right foot brace to see if it helped . Greater than 50% time during this 30 minute visit was spent on counseling and coordination of care about her restless legs, paresthesias in her feet and walking difficulty.She'll return for follow-up in 3 months or call earlier if necessary Antony Contras, MD    05/26/2015, 6:01 PM [facility] [address] [place], [place] [address] [phone]

## 2015-05-26 NOTE — Patient Instructions (Signed)
I had a long d/w patient about her remote stroke, risk for recurrent stroke/TIAs, personally independently reviewed imaging studies and stroke evaluation results and answered questions.Continue aspirin 325 mg daily  for secondary stroke prevention and maintain strict control of hypertension with blood pressure goal below 130/90, diabetes with hemoglobin A1c goal below 6.5% and lipids with LDL cholesterol goal below 70 mg/dL.. I also discussed her worsening of feet paresthesias which may represent restless leg syndrome versus neuropathy. Check EMG nerve conduction study. Increase dose of Cymbalta to 120 mg daily. To help with neuropathic pain as well as suboptimally treated depression She may use additional doses of Mirapex of Klonopin during the day if needed. She was advised to keep her appointment with her foot doctor and try wearing the right foot brace to see if it helped .She'll return for follow-up in 3 months or call earlier if necessary  Restless Legs Syndrome Restless legs syndrome is a condition that causes uncomfortable feelings or sensations in the legs, especially while sitting or lying down. The sensations usually cause an overwhelming urge to move the legs. The arms can also sometimes be affected. The condition can range from mild to severe. The symptoms often interfere with a person's ability to sleep. CAUSES The cause of this condition is not known. RISK FACTORS This condition is more likely to develop in:  People who are older than age 5.  Pregnant women. In general, restless legs syndrome is more common in women than in men.  People who have a family history of the condition.  People who have certain medical conditions, such as iron deficiency, kidney disease, Parkinson disease, or nerve damage.  People who take certain medicines, such as medicines for high blood pressure, nausea, colds, allergies, depression, and some heart conditions. SYMPTOMS The main symptom of this  condition is uncomfortable sensations in the legs. These sensations may be:  Described as pulling, tingling, prickling, throbbing, crawling, or burning.  Worse while you are sitting or lying down.  Worse during periods of rest or inactivity.  Worse at night, often interfering with your sleep.  Accompanied by a very strong urge to move your legs.  Temporarily relieved by movement of your legs. The sensations usually affect both sides of the body. The arms can also be affected, but this is rare. People who have this condition often have tiredness during the day because of their lack of sleep at night. DIAGNOSIS This condition may be diagnosed based on your description of the symptoms. You may also have tests, including blood tests, to check for other conditions that may lead to your symptoms. In some cases, you may be asked to spend some time in a sleep lab so your sleeping can be monitored. TREATMENT Treatment for this condition is focused on managing the symptoms. Treatment may include:  Self-help and lifestyle changes.  Medicines. HOME CARE INSTRUCTIONS  Take medicines only as directed by your health care provider.  Try these methods to get temporary relief from the uncomfortable sensations:  Massage your legs.  Walk or stretch.  Take a cold or hot bath.  Practice good sleep habits. For example, go to bed and get up at the same time every day.  Exercise regularly.  Practice ways of relaxing, such as yoga or meditation.  Avoid caffeine and alcohol.  Do not use any tobacco products, including cigarettes, chewing tobacco, or electronic cigarettes. If you need help quitting, ask your health care provider.  Keep all follow-up visits as directed by  your health care provider. This is important. SEEK MEDICAL CARE IF: Your symptoms do not improve with treatment, or they get worse.   This information is not intended to replace advice given to you by your health care provider.  Make sure you discuss any questions you have with your health care provider.   Document Released: 01/08/2002 Document Revised: 06/04/2014 Document Reviewed: 01/14/2014 Elsevier Interactive Patient Education [employer].

## 2015-05-29 ENCOUNTER — Ambulatory Visit: Payer: Medicare Other | Admitting: Neurology

## 2015-06-18 ENCOUNTER — Other Ambulatory Visit: Payer: Self-pay | Admitting: Internal Medicine

## 2015-07-10 ENCOUNTER — Other Ambulatory Visit: Payer: Self-pay | Admitting: Neurology

## 2015-07-10 ENCOUNTER — Other Ambulatory Visit: Payer: Self-pay | Admitting: Internal Medicine

## 2015-07-20 ENCOUNTER — Other Ambulatory Visit: Payer: Self-pay | Admitting: Neurology

## 2015-07-21 ENCOUNTER — Other Ambulatory Visit: Payer: Self-pay

## 2015-07-21 ENCOUNTER — Telehealth: Payer: Self-pay

## 2015-07-21 MED ORDER — LEVETIRACETAM 500 MG PO TABS: 250.0000 mg | ORAL_TABLET | Freq: Two times a day (BID) | ORAL | Status: DC

## 2015-07-21 NOTE — Telephone Encounter (Signed)
Pt called back and said she will have to check her work schedule then call back to make appt

## 2015-07-21 NOTE — Telephone Encounter (Signed)
LFt vm for patient. Pt given 90 day supply for keppra. Pt was seen in April 2017 and never schedule a follow up. Pt was suppose to make arrangements with billing to make payments.

## 2015-07-21 NOTE — Telephone Encounter (Signed)
Refill done for 90 days. 

## 2015-08-21 ENCOUNTER — Other Ambulatory Visit: Payer: Self-pay | Admitting: Internal Medicine

## 2015-08-21 ENCOUNTER — Other Ambulatory Visit: Payer: Self-pay | Admitting: Neurology

## 2015-08-22 ENCOUNTER — Other Ambulatory Visit: Payer: Self-pay

## 2015-08-22 MED ORDER — BACLOFEN 20 MG PO TABS: ORAL_TABLET | ORAL | Status: DC

## 2015-08-29 ENCOUNTER — Encounter: Payer: Self-pay | Admitting: Internal Medicine

## 2015-08-29 ENCOUNTER — Ambulatory Visit (INDEPENDENT_AMBULATORY_CARE_PROVIDER_SITE_OTHER): Payer: Medicare Other | Admitting: Internal Medicine

## 2015-08-29 DIAGNOSIS — Z720 Tobacco use: Secondary | ICD-10-CM

## 2015-08-29 DIAGNOSIS — F32A Depression, unspecified: Secondary | ICD-10-CM

## 2015-08-29 DIAGNOSIS — F329 Major depressive disorder, single episode, unspecified: Secondary | ICD-10-CM | POA: Diagnosis not present

## 2015-08-29 MED ORDER — SERTRALINE HCL 100 MG PO TABS: 100.0000 mg | ORAL_TABLET | Freq: Every day | ORAL | 3 refills | Status: DC

## 2015-08-29 MED ORDER — HYDROCODONE-HOMATROPINE 5-1.5 MG/5ML PO SYRP: 5.0000 mL | ORAL_SOLUTION | Freq: Three times a day (TID) | ORAL | 0 refills | Status: DC | PRN

## 2015-08-29 NOTE — Progress Notes (Signed)
   Subjective:    Patient ID: Anna Lyons, female    DOB: February 28, 1955, 60 y.o.   MRN: [id number]  HPI  The patient is a 60 YO female coming in for follow up of her depression which is severe. She was recently at the beach and was not able to stop crying most of the time. Recently her neurologist increased her cymbalta to 60 mg BID and this has not helped. Previously she has been on many medications for her mood and feels like zoloft has been the best overall. She did not have side effects with it like several she has tried in the past.   Review of Systems  Constitutional: Positive for activity change and appetite change. Negative for fatigue, fever and unexpected weight change.  HENT: Negative.   Respiratory: Positive for cough. Negative for shortness of breath and wheezing.   Cardiovascular: Negative.   Gastrointestinal: Negative for abdominal distention, abdominal pain, blood in stool, constipation, diarrhea, nausea and vomiting.  Musculoskeletal: Negative.   Skin: Negative.   Neurological: Negative.   Psychiatric/Behavioral: Positive for agitation, decreased concentration, dysphoric mood and sleep disturbance. Negative for self-injury and suicidal ideas. The patient is nervous/anxious. The patient is not hyperactive.       Objective:   Physical Exam  Constitutional: She is oriented to person, place, and time. She appears well-developed and well-nourished.  HENT:  Head: Normocephalic and atraumatic.  Eyes: EOM are normal.  Neck: Normal range of motion.  Cardiovascular: Normal rate and regular rhythm.   Pulmonary/Chest: Effort normal and breath sounds normal. No respiratory distress. She has no wheezes. She has no rales.  Abdominal: Soft. She exhibits no distension. There is no tenderness. There is no rebound and no guarding.  Neurological: She is alert and oriented to person, place, and time.  Skin: Skin is warm and dry.  Psychiatric:  Flat affect   Vitals:   08/29/15 1105  BP:  120/62  Pulse: 62  Resp: 14  Temp: 98 F (36.7 C)  TempSrc: Oral  SpO2: 97%  Weight: 141 lb (64 kg)  Height: 5\' 2"  (1.575 m)      Assessment & Plan:

## 2015-08-29 NOTE — Patient Instructions (Signed)
We have sent in the zoloft for the depression today that you can start taking. You need to stop taking the cymbalta (duloxetine) twice a day and cut back to once a day.   Take 1 pill of the zoloft daily and then let us know how it is working in about 3-4 weeks.   We have given you a cough medicine called hycodan that you can use in the evening for cough.

## 2015-08-29 NOTE — Assessment & Plan Note (Signed)
She is having a chronic cough which could be related. Reminded her that she is putting herself at risk for problems by continuing to smoke. She is smoking about 1/2 PPD now and no intention to quit at this time.

## 2015-08-29 NOTE — Progress Notes (Signed)
Pre visit review using our clinic review tool, if applicable. No additional management support is needed unless otherwise documented below in the visit note. 

## 2015-08-29 NOTE — Assessment & Plan Note (Signed)
Have her decrease the cymbalta back to 60 mg daily (continue for pain control) and add zoloft 100 mg daily. She will call us in about 3-4 weeks with her status so we can adjust as needed.

## 2015-09-01 ENCOUNTER — Other Ambulatory Visit: Payer: Self-pay | Admitting: Internal Medicine

## 2015-09-22 ENCOUNTER — Other Ambulatory Visit: Payer: Self-pay | Admitting: Internal Medicine

## 2015-09-23 ENCOUNTER — Ambulatory Visit (INDEPENDENT_AMBULATORY_CARE_PROVIDER_SITE_OTHER): Payer: Medicare Other | Admitting: Internal Medicine

## 2015-09-23 ENCOUNTER — Encounter: Payer: Self-pay | Admitting: Internal Medicine

## 2015-09-23 DIAGNOSIS — F32A Depression, unspecified: Secondary | ICD-10-CM

## 2015-09-23 DIAGNOSIS — F329 Major depressive disorder, single episode, unspecified: Secondary | ICD-10-CM

## 2015-09-23 DIAGNOSIS — Z23 Encounter for immunization: Secondary | ICD-10-CM | POA: Diagnosis not present

## 2015-09-23 MED ORDER — SUVOREXANT 10 MG PO TABS: 10.0000 mg | ORAL_TABLET | Freq: Every evening | ORAL | 5 refills | Status: DC | PRN

## 2015-09-23 MED ORDER — SERTRALINE HCL 100 MG PO TABS: 200.0000 mg | ORAL_TABLET | Freq: Every day | ORAL | 3 refills | Status: DC

## 2015-09-23 MED ORDER — TRIAMTERENE-HCTZ 75-50 MG PO TABS: 0.5000 | ORAL_TABLET | Freq: Every day | ORAL | 6 refills | Status: DC

## 2015-09-23 NOTE — Assessment & Plan Note (Signed)
Increase zoloft to 200 mg daily and give it 3-4 weeks. If not helping will need to switch agent. Also taking cymbalta and clonazepam.

## 2015-09-23 NOTE — Progress Notes (Signed)
Pre visit review using our clinic review tool, if applicable. No additional management support is needed unless otherwise documented below in the visit note. 

## 2015-09-23 NOTE — Progress Notes (Signed)
   Subjective:    Patient ID: Anna Lyons, female    DOB: [birth year], 60 y.o.   MRN: [id number]  HPI The patient is a 60 YO female coming in for follow up of her depression. She was started on zoloft about 1 month ago. It is helping some and she is wondering if the dose could be adjusted. She is still having some increased emotion and lability. Sleeping poorly still. She is still taking cymbalta 60 mg daily for pain control. Maybe getting 2-3 hours per night of sleep.   Review of Systems  Constitutional: Positive for activity change and appetite change. Negative for fatigue, fever and unexpected weight change.  Respiratory: Negative for shortness of breath and wheezing.   Cardiovascular: Negative.   Gastrointestinal: Negative for abdominal distention, abdominal pain, blood in stool, constipation, diarrhea, nausea and vomiting.  Musculoskeletal: Negative.   Skin: Negative.   Neurological: Negative.   Psychiatric/Behavioral: Positive for decreased concentration, dysphoric mood and sleep disturbance. Negative for agitation, self-injury and suicidal ideas. The patient is nervous/anxious. The patient is not hyperactive.       Objective:   Physical Exam  Constitutional: She is oriented to person, place, and time. She appears well-developed and well-nourished.  HENT:  Head: Normocephalic and atraumatic.  Eyes: EOM are normal.  Neck: Normal range of motion.  Cardiovascular: Normal rate and regular rhythm.   Pulmonary/Chest: Effort normal and breath sounds normal. No respiratory distress. She has no wheezes. She has no rales.  Abdominal: Soft. She exhibits no distension. There is no tenderness. There is no rebound and no guarding.  Neurological: She is alert and oriented to person, place, and time.  Skin: Skin is warm and dry.  Psychiatric:  Flat affect, tearful during the visit.    Vitals:   09/23/15 1016  BP: (!) 112/50  Pulse: 83  Resp: 18  Temp: 98.3 F (36.8 C)  TempSrc: Oral    SpO2: 95%  Weight: 137 lb (62.1 kg)  Height: 5\' 3"  (1.6 m)      Assessment & Plan:  Flu shot given at visit

## 2015-09-23 NOTE — Patient Instructions (Signed)
We have sent in the increased dose of the zoloft. Start taking 2 pills of the zoloft daily. When you get the refill you should get more pills per month so you do not run out early.   We have also given you the belsomra which is for sleep. Take 1 pill about 30 minutes before bedtime. This can take 1 week to get in your system fully. It helps to keep you asleep at night time.   Stress and Stress Management Stress is a normal reaction to life events. It is what you feel when life demands more than you are used to or more than you can handle. Some stress can be useful. For example, the stress reaction can help you catch the last bus of the day, study for a test, or meet a deadline at work. But stress that occurs too often or for too long can cause problems. It can affect your emotional health and interfere with relationships and normal daily activities. Too much stress can weaken your immune system and increase your risk for physical illness. If you already have a medical problem, stress can make it worse. CAUSES  All sorts of life events may cause stress. An event that causes stress for one person may not be stressful for another person. Major life events commonly cause stress. These may be positive or negative. Examples include losing your job, moving into a new home, getting married, having a baby, or losing a loved one. Less obvious life events may also cause stress, especially if they occur day after day or in combination. Examples include working long hours, driving in traffic, caring for children, being in debt, or being in a difficult relationship. SIGNS AND SYMPTOMS Stress may cause emotional symptoms including, the following:  Anxiety. This is feeling worried, afraid, on edge, overwhelmed, or out of control.  Anger. This is feeling irritated or impatient.  Depression. This is feeling sad, down, helpless, or guilty.  Difficulty focusing, remembering, or making decisions. Stress may cause  physical symptoms, including the following:   Aches and pains. These may affect your head, neck, back, stomach, or other areas of your body.  Tight muscles or clenched jaw.  Low energy or trouble sleeping. Stress may cause unhealthy behaviors, including the following:   Eating to feel better (overeating) or skipping meals.  Sleeping too little, too much, or both.  Working too much or putting off tasks (procrastination).  Smoking, drinking alcohol, or using drugs to feel better. DIAGNOSIS  Stress is diagnosed through an assessment by your health care provider. Your health care provider will ask questions about your symptoms and any stressful life events.Your health care provider will also ask about your medical history and may order blood tests or other tests. Certain medical conditions and medicine can cause physical symptoms similar to stress. Mental illness can cause emotional symptoms and unhealthy behaviors similar to stress. Your health care provider may refer you to a mental health professional for further evaluation.  TREATMENT  Stress management is the recommended treatment for stress.The goals of stress management are reducing stressful life events and coping with stress in healthy ways.  Techniques for reducing stressful life events include the following:  Stress identification. Self-monitor for stress and identify what causes stress for you. These skills may help you to avoid some stressful events.  Time management. Set your priorities, keep a calendar of events, and learn to say "no." These tools can help you avoid making too many commitments. Techniques for coping  with stress include the following:  Rethinking the problem. Try to think realistically about stressful events rather than ignoring them or overreacting. Try to find the positives in a stressful situation rather than focusing on the negatives.  Exercise. Physical exercise can release both physical and emotional  tension. The key is to find a form of exercise you enjoy and do it regularly.  Relaxation techniques. These relax the body and mind. Examples include yoga, meditation, tai chi, biofeedback, deep breathing, progressive muscle relaxation, listening to music, being out in nature, journaling, and other hobbies. Again, the key is to find one or more that you enjoy and can do regularly.  Healthy lifestyle. Eat a balanced diet, get plenty of sleep, and do not smoke. Avoid using alcohol or drugs to relax.  Strong support network. Spend time with family, friends, or other people you enjoy being around.Express your feelings and talk things over with someone you trust. Counseling or talktherapy with a mental health professional may be helpful if you are having difficulty managing stress on your own. Medicine is typically not recommended for the treatment of stress.Talk to your health care provider if you think you need medicine for symptoms of stress. HOME CARE INSTRUCTIONS  Keep all follow-up visits as directed by your health care provider.  Take all medicines as directed by your health care provider. SEEK MEDICAL CARE IF:  Your symptoms get worse or you start having new symptoms.  You feel overwhelmed by your problems and can no longer manage them on your own. SEEK IMMEDIATE MEDICAL CARE IF:  You feel like hurting yourself or someone else.   This information is not intended to replace advice given to you by your health care provider. Make sure you discuss any questions you have with your health care provider.   Document Released: 07/14/2000 Document Revised: 02/08/2014 Document Reviewed: 09/12/2012 Elsevier Interactive Patient Education [employer].

## 2015-09-26 ENCOUNTER — Telehealth: Payer: Self-pay | Admitting: Internal Medicine

## 2015-09-26 MED ORDER — TRAZODONE HCL 100 MG PO TABS: 100.0000 mg | ORAL_TABLET | Freq: Every day | ORAL | 3 refills | Status: DC

## 2015-09-26 NOTE — Telephone Encounter (Signed)
Patient aware.

## 2015-09-26 NOTE — Telephone Encounter (Signed)
Have sent in trazodone instead. This is also safe and she will take 1 pill at night time.

## 2015-09-26 NOTE — Telephone Encounter (Signed)
Pt called stating Belsomra is going to cost her $315 and the card said she is not eligible. Please advise.

## 2015-10-05 ENCOUNTER — Other Ambulatory Visit: Payer: Self-pay | Admitting: Internal Medicine

## 2015-10-07 ENCOUNTER — Other Ambulatory Visit (INDEPENDENT_AMBULATORY_CARE_PROVIDER_SITE_OTHER): Payer: Medicare Other

## 2015-10-07 ENCOUNTER — Encounter: Payer: Self-pay | Admitting: Internal Medicine

## 2015-10-07 ENCOUNTER — Other Ambulatory Visit: Payer: Self-pay | Admitting: Internal Medicine

## 2015-10-07 ENCOUNTER — Ambulatory Visit (INDEPENDENT_AMBULATORY_CARE_PROVIDER_SITE_OTHER): Payer: Medicare Other | Admitting: Internal Medicine

## 2015-10-07 DIAGNOSIS — F329 Major depressive disorder, single episode, unspecified: Secondary | ICD-10-CM

## 2015-10-07 DIAGNOSIS — R110 Nausea: Secondary | ICD-10-CM

## 2015-10-07 DIAGNOSIS — R1013 Epigastric pain: Secondary | ICD-10-CM

## 2015-10-07 DIAGNOSIS — I10 Essential (primary) hypertension: Secondary | ICD-10-CM | POA: Diagnosis not present

## 2015-10-07 DIAGNOSIS — F32A Depression, unspecified: Secondary | ICD-10-CM

## 2015-10-07 LAB — HEPATIC FUNCTION PANEL
ALT: 14 U/L (ref 0–35)
AST: 19 U/L (ref 0–37)
Albumin: 4.1 g/dL (ref 3.5–5.2)
Alkaline Phosphatase: 69 U/L (ref 39–117)
BILIRUBIN TOTAL: 0.3 mg/dL (ref 0.2–1.2)
Bilirubin, Direct: 0.1 mg/dL (ref 0.0–0.3)
Total Protein: 7.1 g/dL (ref 6.0–8.3)

## 2015-10-07 LAB — CBC WITH DIFFERENTIAL/PLATELET
Basophils Absolute: 0 10*3/uL (ref 0.0–0.1)
Basophils Relative: 0.5 % (ref 0.0–3.0)
Eosinophils Absolute: 0.1 10*3/uL (ref 0.0–0.7)
Eosinophils Relative: 1.7 % (ref 0.0–5.0)
HCT: 37.4 % (ref 36.0–46.0)
Hemoglobin: 12.9 g/dL (ref 12.0–15.0)
Lymphocytes Relative: 32.7 % (ref 12.0–46.0)
Lymphs Abs: 1.9 10*3/uL (ref 0.7–4.0)
MCHC: 34.6 g/dL (ref 30.0–36.0)
MCV: 92.6 fl (ref 78.0–100.0)
Monocytes Absolute: 0.4 10*3/uL (ref 0.1–1.0)
Monocytes Relative: 7.5 % (ref 3.0–12.0)
Neutro Abs: 3.4 10*3/uL (ref 1.4–7.7)
Neutrophils Relative %: 57.6 % (ref 43.0–77.0)
Platelets: 204 10*3/uL (ref 150.0–400.0)
RBC: 4.04 Mil/uL (ref 3.87–5.11)
RDW: 12.7 % (ref 11.5–15.5)
WBC: 5.9 10*3/uL (ref 4.0–10.5)

## 2015-10-07 LAB — LIPASE: Lipase: 23 U/L (ref 11.0–59.0)

## 2015-10-07 MED ORDER — RANITIDINE HCL 300 MG PO CAPS: 300.0000 mg | ORAL_CAPSULE | Freq: Every evening | ORAL | 5 refills | Status: DC

## 2015-10-07 NOTE — Progress Notes (Signed)
Subjective:  Patient ID: Anna Lyons, female    DOB: 1955-11-08  Age: 60 y.o. MRN: [id number]  CC: Nausea (x 4-5 days) and Dizziness   HPI Anna Lyons presents for dizziness and nausea w/a "drunk" feeling since last Friday. She was supposed to stop Cymbalta and start Zoloft 200 mg/d 2 weeks ago. It is likely that she is taking both now - Cymbalta and Zoloft. C/o insomnia.Marland KitchenMarland KitchenMarland KitchenFell once  Outpatient Medications Prior to Visit  Medication Sig Dispense Refill  . aspirin EC 325 MG tablet Take 325 mg by mouth daily.    [name] atorvastatin (LIPITOR) 80 MG tablet TAKE ONE TABLET BY MOUTH ONCE DAILY 90 tablet 0  . baclofen (LIORESAL) 20 MG tablet TAKE ONE-HALF TABLET BY MOUTH THREE TIMES DAILY FOR  1  WEEK,  THEN  INCREASE  TO  TAKE  1  TAB  THREE  TIMES  DAILY 90 tablet 1  . clonazePAM (KLONOPIN) 1 MG tablet Take 1 tablet (1 mg total) by mouth at bedtime. 30 tablet 1  . diclofenac sodium (VOLTAREN) 1 % GEL Apply 2 g topically 4 (four) times daily. 100 g 3  . levETIRAcetam (KEPPRA) 500 MG tablet Take 1 tablet (500 mg total) by mouth 2 (two) times daily. 90 tablet 1  . Multiple Vitamin (MULTIVITAMIN) capsule Take 1 capsule by mouth daily.     . ondansetron (ZOFRAN) 4 MG tablet Take 1 tablet (4 mg total) by mouth every 8 (eight) hours as needed for nausea or vomiting. 6 tablet 0  . pramipexole (MIRAPEX) 0.5 MG tablet TAKE ONE TABLET BY MOUTH AT BEDTIME AS NEEDED 30 tablet 1  . sertraline (ZOLOFT) 100 MG tablet Take 2 tablets (200 mg total) by mouth daily. 60 tablet 3  . Suvorexant (BELSOMRA) 10 MG TABS Take 10 mg by mouth at bedtime as needed (sleep). 30 tablet 5  . triamterene-hydrochlorothiazide (MAXZIDE) 75-50 MG tablet Take 0.5 tablets by mouth daily. 45 tablet 6  . DULoxetine (CYMBALTA) 60 MG capsule Take 1 capsule (60 mg total) by mouth 2 (two) times daily. 30 capsule 6  . traZODone (DESYREL) 100 MG tablet Take 1 tablet (100 mg total) by mouth at bedtime. (Patient not taking: Reported on 10/07/2015) 30  tablet 3   No facility-administered medications prior to visit.     ROS Review of Systems  Constitutional: Positive for fatigue. Negative for activity change, appetite change, chills and unexpected weight change.  HENT: Negative for congestion, mouth sores and sinus pressure.   Eyes: Negative for visual disturbance.  Respiratory: Negative for cough and chest tightness.   Gastrointestinal: Positive for abdominal pain and nausea. Negative for vomiting.  Genitourinary: Negative for difficulty urinating, frequency and vaginal pain.  Musculoskeletal: Negative for back pain and gait problem.  Skin: Negative for pallor and rash.  Neurological: Positive for weakness. Negative for dizziness, tremors, numbness and headaches.  Psychiatric/Behavioral: Positive for decreased concentration, dysphoric mood and sleep disturbance. Negative for confusion and suicidal ideas. The patient is nervous/anxious.     Objective:  BP 112/84   Pulse 69   Temp 98.1 F (36.7 C) (Oral)   Wt 138 lb (62.6 kg)   SpO2 98%   BMI 24.45 kg/m   BP Readings from Last 3 Encounters:  10/07/15 112/84  09/23/15 (!) 112/50  08/29/15 120/62    Wt Readings from Last 3 Encounters:  10/07/15 138 lb (62.6 kg)  09/23/15 137 lb (62.1 kg)  08/29/15 141 lb (64 kg)    Physical Exam  Constitutional: She appears well-developed. No distress.  HENT:  Head: Normocephalic.  Right Ear: External ear normal.  Left Ear: External ear normal.  Nose: Nose normal.  Mouth/Throat: Oropharynx is clear and moist.  Eyes: Conjunctivae are normal. Pupils are equal, round, and reactive to light. Right eye exhibits no discharge. Left eye exhibits no discharge.  Neck: Normal range of motion. Neck supple. No JVD present. No tracheal deviation present. No thyromegaly present.  Cardiovascular: Normal rate, regular rhythm and normal heart sounds.   Pulmonary/Chest: No stridor. No respiratory distress. She has no wheezes.  Abdominal: Soft. Bowel  sounds are normal. She exhibits no distension and no mass. There is tenderness. There is no rebound and no guarding.  Musculoskeletal: She exhibits no edema or tenderness.  Lymphadenopathy:    She has no cervical adenopathy.  Neurological: She displays normal reflexes. No cranial nerve deficit. She exhibits normal muscle tone. Coordination abnormal.  Skin: No rash noted. No erythema.  Psychiatric: Her behavior is normal. Judgment and thought content normal.  Slow Epig area is tender No HSM Ataxic  Lab Results  Component Value Date   WBC 4.6 10/10/2014   HGB 12.2 10/10/2014   HCT 36.0 10/10/2014   PLT 187 10/10/2014   GLUCOSE 96 03/05/2015   CHOL 132 12/20/2013   TRIG 139.0 12/20/2013   HDL 46.30 12/20/2013   LDLCALC 58 12/20/2013   ALT 21 03/05/2015   AST 29 03/05/2015   NA 139 03/05/2015   K 3.5 03/05/2015   CL 101 03/05/2015   CREATININE 0.70 03/05/2015   BUN 14 03/05/2015   CO2 28 03/05/2015   TSH 1.778 05/19/2012   INR 1.01 11/16/2011   HGBA1C 5.8 03/05/2015    Dg Chest 2 View  Result Date: 10/10/2014 CLINICAL DATA:  Status post syncopal episode. EXAM: CHEST  2 VIEW COMPARISON:  CT of the chest dated 08/17/2013, chest radiograph dated 08/15/2013. FINDINGS: Cardiomediastinal silhouette is mildly enlarged. Mediastinal contours appear intact. The aorta is torturous and contains atherosclerotic calcifications at the arch. There is no evidence of focal airspace consolidation, or pneumothorax. There is small left pleural effusion or pleural thickening. Osseous structures are without acute abnormality. Soft tissues are grossly normal. IMPRESSION: Mildly enlarged cardiac silhouette. Probable small left pleural effusion. Atherosclerotic disease of the aorta. Electronically Signed   By: Fidela Salisbury M.D.   On: 10/10/2014 11:14   Ct Head Wo Contrast  Result Date: 10/10/2014 CLINICAL DATA:  Status post fall this morning with contusion and abrasions to the forehead. EXAM: CT  HEAD WITHOUT CONTRAST TECHNIQUE: Contiguous axial images were obtained from the base of the skull through the vertex without intravenous contrast. COMPARISON:  MRI of the head dated 04/09/2014 FINDINGS: No mass effect or midline shift. No evidence of acute intracranial hemorrhage, or infarction. No abnormal extra-axial fluid collections. Gray-white matter differentiation is normal. Basal cisterns are preserved. Lacunar old infarct of basal ganglia are noted. There is a large right frontal hematoma. No depressed skull fractures. Visualized paranasal sinuses and mastoid air cells are not opacified. IMPRESSION: No acute intracranial abnormality. Chronic lacunar basal ganglia infarct. Right frontal hematoma without underlying bony abnormality. Electronically Signed   By: Fidela Salisbury M.D.   On: 10/10/2014 11:50   Dg Knee Complete 4 Views Right  Result Date: 10/10/2014 CLINICAL DATA:  Status post fall with abrasion to the anterior right knee. EXAM: RIGHT KNEE - COMPLETE 4+ VIEW COMPARISON:  None. FINDINGS: No evidence of acute fracture or subluxation. No focal bone lesion or  bone destruction. Bone cortex and trabecular architecture appear intact. No radiopaque soft tissue foreign bodies. Sherald Barge is noted. IMPRESSION: No acute fracture or dislocation identified about the right Knee. Electronically Signed   By: Fidela Salisbury M.D.   On: 10/10/2014 11:09   Dg Hand Complete Right  Result Date: 10/10/2014 CLINICAL DATA:  Bruising of the right hand status post fall. EXAM: RIGHT HAND - COMPLETE 3+ VIEW COMPARISON:  None. FINDINGS: No evidence of acute fracture or subluxation. No focal bone lesion or bone destruction. Bone cortex and trabecular architecture appear intact. Mild osteoarthritic changes of the first carpometacarpal joints are seen. No radiopaque soft tissue foreign bodies. IMPRESSION: No acute fracture or dislocation identified about the right Hand. 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 Electronically Signed   By: Fidela Salisbury M.D.   On: 10/10/2014 11:11    Assessment & Plan:   There are no diagnoses linked to this encounter. I have discontinued Ms. Decola's DULoxetine. I am also having her maintain her aspirin EC, diclofenac sodium, multivitamin, ondansetron, clonazePAM, levETIRAcetam, atorvastatin, baclofen, triamterene-hydrochlorothiazide, sertraline, Suvorexant, traZODone, and pramipexole.  No orders of the defined types were placed in this encounter.    Follow-up: No Follow-up on file.  Walker Kehr, MD

## 2015-10-07 NOTE — Patient Instructions (Addendum)
Do not take Cymbalta Take Zoloft 100 mg/day Take Ranitidine 1 a day for your stomach Do not take Maxzide for a 1 week - your blood pressure is low. Go to ER if worse

## 2015-10-07 NOTE — Progress Notes (Signed)
Pre visit review using our clinic review tool, if applicable. No additional management support is needed unless otherwise documented below in the visit note. 

## 2015-10-07 NOTE — Assessment & Plan Note (Signed)
Low BP Hold Maxzide

## 2015-10-07 NOTE — Assessment & Plan Note (Signed)
9/17: dizziness and nausea w/a "drunk" feeling since last Friday. She was supposed to stop Cymbalta and start Zoloft 200 mg/d 2 weeks ago. It is likely that she is taking both now - Cymbalta and Zoloft. Stop Cymbalta. Reduce Zoloft to 100 mg/d Dr Sharlet Salina in 1 week No driving

## 2015-10-07 NOTE — Assessment & Plan Note (Signed)
[facility] Zantac 300 mg at hs

## 2015-10-15 ENCOUNTER — Other Ambulatory Visit (INDEPENDENT_AMBULATORY_CARE_PROVIDER_SITE_OTHER): Payer: Medicare Other

## 2015-10-15 ENCOUNTER — Encounter: Payer: Self-pay | Admitting: Internal Medicine

## 2015-10-15 ENCOUNTER — Ambulatory Visit (INDEPENDENT_AMBULATORY_CARE_PROVIDER_SITE_OTHER): Payer: Medicare Other | Admitting: Internal Medicine

## 2015-10-15 VITALS — BP 138/90 | HR 85 | Temp 98.3°F | Resp 16 | Ht 64.0 in | Wt 136.0 lb

## 2015-10-15 DIAGNOSIS — R5383 Other fatigue: Secondary | ICD-10-CM

## 2015-10-15 DIAGNOSIS — F332 Major depressive disorder, recurrent severe without psychotic features: Secondary | ICD-10-CM

## 2015-10-15 LAB — VITAMIN B12: VITAMIN B 12: 468 pg/mL (ref 211–911)

## 2015-10-15 LAB — TSH: TSH: 2.84 u[IU]/mL (ref 0.35–4.50)

## 2015-10-15 LAB — FERRITIN: FERRITIN: 163 ng/mL (ref 10.0–291.0)

## 2015-10-15 LAB — T4, FREE: FREE T4: 0.7 ng/dL (ref 0.60–1.60)

## 2015-10-15 MED ORDER — VILAZODONE HCL 20 MG PO TABS: 20.0000 mg | ORAL_TABLET | Freq: Every day | ORAL | 3 refills | Status: DC

## 2015-10-15 NOTE — Progress Notes (Signed)
Pre visit review using our clinic review tool, if applicable. No additional management support is needed unless otherwise documented below in the visit note. 

## 2015-10-15 NOTE — Assessment & Plan Note (Signed)
She is having a severe episode of her major depression which is not well controlled. She will stop taking zoloft. Start viibryd since she has failed cymbalta and zoloft for control recently and has been on many agents in the past. She has seen psych in the past and is willing to return if needed. Did some therapy but did not get along well with the therapist. If this medication does not help she will need to see psych again. Her daughter is supportive and now that she is aware of the severity of the situation can be a resource for her.

## 2015-10-15 NOTE — Progress Notes (Signed)
   Subjective:    Patient ID: Anna Lyons, female    DOB: 19-Nov-1955, 60 y.o.   MRN: [id number]  HPI The patient is a 60 YO female coming in for follow up of medication side effect. She was taking too much of her cymbalta and zoloft and was having nausea and stomach pain. She stopped taking the cymbalta and cut back on zoloft 100 mg daily only. Her daughter is with her and helps to provide history. She is still having severe depression which is not helped at all by the zoloft. She has struggled with depression for many years and it is bad now. No pleasure with activities, a lot of guilt, not sleeping well. Denies SI/HI. She is not having the stomach pain anymore. Took some ranitidine for a few days. Still with appetite and energy change.   Review of Systems  Constitutional: Positive for activity change, appetite change and fatigue. Negative for fever and unexpected weight change.  Respiratory: Negative for shortness of breath and wheezing.   Cardiovascular: Negative.   Gastrointestinal: Negative for abdominal distention, abdominal pain, blood in stool, constipation, diarrhea, nausea and vomiting.  Musculoskeletal: Negative.   Skin: Negative.   Neurological: Negative.   Psychiatric/Behavioral: Positive for behavioral problems, decreased concentration, dysphoric mood and sleep disturbance. Negative for agitation, self-injury and suicidal ideas. The patient is nervous/anxious. The patient is not hyperactive.       Objective:   Physical Exam  Constitutional: She appears well-developed and well-nourished.  HENT:  Head: Normocephalic and atraumatic.  Eyes: EOM are normal.  Neck: Normal range of motion.  Cardiovascular: Normal rate and regular rhythm.   Pulmonary/Chest: Effort normal and breath sounds normal. No respiratory distress. She has no wheezes. She has no rales.  Abdominal: Soft. She exhibits no distension. There is no tenderness.  Neurological: She is alert.  Skin: Skin is warm and  dry.  Psychiatric:  Flat affect, tearful during the visit.    Vitals:   10/15/15 1020  BP: 138/90  Pulse: 85  Resp: 16  Temp: 98.3 F (36.8 C)  TempSrc: Oral  SpO2: 97%  Weight: 136 lb (61.7 kg)  Height: 5\' 4"  (1.626 m)      Assessment & Plan:

## 2015-10-15 NOTE — Patient Instructions (Signed)
We want you to stop taking the zoloft.   We have sent in viibryd which is a newer medicine for depression. Take 1 pill daily and we will give it 2-3 weeks to start working.   Call us or send a message on mychart to let us know how you are feeling.

## 2015-10-16 ENCOUNTER — Telehealth: Payer: Self-pay | Admitting: Internal Medicine

## 2015-10-16 NOTE — Telephone Encounter (Signed)
Pharmacy did not have viibryd on hand. They have ordered it and patient will pick up in a couple of days.

## 2015-10-16 NOTE — Telephone Encounter (Signed)
Pt called in said she has some questions about the meds that she was given yesterday. Can you call her when you get a chance?

## 2015-10-20 ENCOUNTER — Telehealth: Payer: Self-pay | Admitting: Geriatric Medicine

## 2015-10-20 DIAGNOSIS — M25532 Pain in left wrist: Secondary | ICD-10-CM

## 2015-10-20 NOTE — Telephone Encounter (Signed)
Patient is having left wrist pain. She has tried the wrist brace and it is not any better. She is asking for a referral to have her wrist looked at.

## 2015-10-20 NOTE — Telephone Encounter (Signed)
Referral placed for the hand specialist.

## 2015-10-31 ENCOUNTER — Telehealth: Payer: Self-pay | Admitting: Internal Medicine

## 2015-10-31 NOTE — Telephone Encounter (Signed)
Pt called and wanted to know if she can take mucinex with the depression meds ?

## 2015-10-31 NOTE — Telephone Encounter (Signed)
Yes she can.

## 2015-10-31 NOTE — Telephone Encounter (Signed)
Patient aware.

## 2015-11-06 ENCOUNTER — Other Ambulatory Visit: Payer: Self-pay | Admitting: Orthopedic Surgery

## 2015-11-06 DIAGNOSIS — M25532 Pain in left wrist: Secondary | ICD-10-CM

## 2015-11-12 ENCOUNTER — Other Ambulatory Visit: Payer: Self-pay | Admitting: Neurology

## 2015-11-12 ENCOUNTER — Ambulatory Visit (INDEPENDENT_AMBULATORY_CARE_PROVIDER_SITE_OTHER): Payer: Medicare Other | Admitting: Internal Medicine

## 2015-11-12 ENCOUNTER — Encounter: Payer: Self-pay | Admitting: Internal Medicine

## 2015-11-12 ENCOUNTER — Other Ambulatory Visit: Payer: Medicare Other

## 2015-11-12 VITALS — BP 100/64 | HR 67 | Temp 97.6°F | Resp 16 | Ht 62.0 in | Wt 140.0 lb

## 2015-11-12 DIAGNOSIS — F332 Major depressive disorder, recurrent severe without psychotic features: Secondary | ICD-10-CM

## 2015-11-12 DIAGNOSIS — M1990 Unspecified osteoarthritis, unspecified site: Secondary | ICD-10-CM | POA: Diagnosis not present

## 2015-11-12 DIAGNOSIS — Z1231 Encounter for screening mammogram for malignant neoplasm of breast: Secondary | ICD-10-CM

## 2015-11-12 DIAGNOSIS — Z1211 Encounter for screening for malignant neoplasm of colon: Secondary | ICD-10-CM | POA: Diagnosis not present

## 2015-11-12 DIAGNOSIS — Z1239 Encounter for other screening for malignant neoplasm of breast: Secondary | ICD-10-CM

## 2015-11-12 MED ORDER — VILAZODONE HCL 40 MG PO TABS: 40.0000 mg | ORAL_TABLET | Freq: Every day | ORAL | 6 refills | Status: DC

## 2015-11-12 NOTE — Progress Notes (Signed)
   Subjective:    Patient ID: Anna Lyons, female    DOB: 28-Nov-1955, 60 y.o.   MRN: [id number]  HPI The patient is a 60 YO female coming in for follow up of her depression. She states that she has not noticed a difference initially. Her daughter is with her. She mentions that her mom is not crying as much. She noticed some more interest in spending time with family. She then does realize that she is not crying as much. She is still having some low energy. She is not sleeping well still but slightly better. Some nausea initially with the viibryd and then started taking it with food and that is gone. No side effects.   Review of Systems  Constitutional: Positive for activity change, appetite change and fatigue. Negative for chills, fever and unexpected weight change.  Respiratory: Negative.   Cardiovascular: Negative.   Gastrointestinal: Negative.   Musculoskeletal: Positive for arthralgias.  Skin: Negative.   Neurological: Negative.   Psychiatric/Behavioral: Positive for dysphoric mood and sleep disturbance. Negative for confusion, decreased concentration, self-injury and suicidal ideas. The patient is not nervous/anxious and is not hyperactive.       Objective:   Physical Exam  Constitutional: She is oriented to person, place, and time. She appears well-developed and well-nourished.  HENT:  Head: Normocephalic and atraumatic.  Cardiovascular: Normal rate and regular rhythm.   Pulmonary/Chest: Effort normal and breath sounds normal.  Abdominal: Soft.  Musculoskeletal:  Brace on left wrist  Neurological: She is alert and oriented to person, place, and time. Coordination normal.  Psychiatric:  No crying during the visit and affect better than prior, still somewhat flat affect.    Vitals:   11/12/15 1017  BP: 100/64  Pulse: 67  Resp: 16  Temp: 97.6 F (36.4 C)  TempSrc: Oral  SpO2: 97%  Weight: 140 lb (63.5 kg)  Height: 5\' 2"  (1.575 m)      Assessment & Plan:

## 2015-11-12 NOTE — Progress Notes (Signed)
Pre visit review using our clinic review tool, if applicable. No additional management support is needed unless otherwise documented below in the visit note. 

## 2015-11-12 NOTE — Patient Instructions (Signed)
We have sent in a new prescription for the viibryd 40 mg daily. You can take 2 pills of the ones you have left until they are gone.   We have ordered the mammogram and will get the GI referral for the colon cancer screening started.

## 2015-11-12 NOTE — Assessment & Plan Note (Signed)
Some improvement on the viibryd and will increase to 40 mg daily. No side effects. She has better affect today and some laughing and no crying. Suspect some improvement but needs increased treatment.

## 2015-11-14 ENCOUNTER — Other Ambulatory Visit: Payer: Self-pay | Admitting: Internal Medicine

## 2015-11-14 DIAGNOSIS — M1990 Unspecified osteoarthritis, unspecified site: Secondary | ICD-10-CM

## 2015-11-14 DIAGNOSIS — R768 Other specified abnormal immunological findings in serum: Secondary | ICD-10-CM

## 2015-11-14 LAB — ANA: ANA: POSITIVE — AB

## 2015-11-14 LAB — ANTI-NUCLEAR AB-TITER (ANA TITER)

## 2015-11-15 ENCOUNTER — Other Ambulatory Visit: Payer: Self-pay | Admitting: Neurology

## 2015-11-17 ENCOUNTER — Telehealth: Payer: Self-pay | Admitting: Internal Medicine

## 2015-11-17 MED ORDER — BACLOFEN 20 MG PO TABS: ORAL_TABLET | ORAL | 1 refills | Status: DC

## 2015-11-17 NOTE — Telephone Encounter (Signed)
Refill of baclofen ordered. I sent her that message so have already placed the referral to rheum.

## 2015-11-17 NOTE — Telephone Encounter (Signed)
Patient wants to talk about test results and states she has another question but would not say what it was.

## 2015-11-17 NOTE — Telephone Encounter (Signed)
Patient got her Lupus test back and they have recommended her to see a rheumatologist. She wants to know if you can put in a referral. Also, wants baclofen refilled and she says Dr. Rockney Ghee will not fill it and she wants to know if you will. Please advise, thanks.

## 2015-11-18 NOTE — Telephone Encounter (Signed)
Patient aware.

## 2015-11-19 ENCOUNTER — Ambulatory Visit
Admission: RE | Admit: 2015-11-19 | Discharge: 2015-11-19 | Disposition: A | Discharge status: Home / Self Care | Payer: Medicare Other | Source: Ambulatory Visit | Attending: Orthopedic Surgery | Admitting: Orthopedic Surgery

## 2015-11-19 DIAGNOSIS — M25532 Pain in left wrist: Secondary | ICD-10-CM

## 2015-11-19 MED ORDER — IOPAMIDOL (ISOVUE-M 200) INJECTION 41%
2.0000 mL | Freq: Once | INTRAMUSCULAR | Status: AC
  Administered 2015-11-19: 2 mL via INTRA_ARTICULAR

## 2015-11-27 ENCOUNTER — Telehealth: Payer: Self-pay | Admitting: Internal Medicine

## 2015-11-27 DIAGNOSIS — N644 Mastodynia: Secondary | ICD-10-CM

## 2015-11-27 NOTE — Telephone Encounter (Signed)
You have ordered a mammogram for pt. She has had a bilateral mastectomy and is unable to get one. She is having pain on her right breast area. She says its in the middle and both sides.  The imaging center is needing an order to do [place] since she is having pain.  Does she need an office visit or can you just put the order in? Please advise

## 2015-11-27 NOTE — Telephone Encounter (Signed)
Order placed

## 2015-11-28 ENCOUNTER — Ambulatory Visit (INDEPENDENT_AMBULATORY_CARE_PROVIDER_SITE_OTHER): Payer: Medicare Other | Admitting: Gastroenterology

## 2015-11-28 ENCOUNTER — Encounter: Payer: Self-pay | Admitting: Gastroenterology

## 2015-11-28 VITALS — BP 90/60 | HR 76 | Ht 62.25 in | Wt 140.0 lb

## 2015-11-28 DIAGNOSIS — Z1211 Encounter for screening for malignant neoplasm of colon: Secondary | ICD-10-CM

## 2015-11-28 MED ORDER — NA SULFATE-K SULFATE-MG SULF 17.5-3.13-1.6 GM/177ML PO SOLN: 1.0000 | Freq: Once | ORAL | 0 refills | Status: AC

## 2015-11-28 NOTE — Progress Notes (Signed)
Anna Lyons    [id number]    [birth year]  Primary Care Physician:Elizabeth Becky Augusta, MD  Referring Physician: Hoyt Koch, MD [address] [place], [place] [address]  Chief complaint:  Discuss screening colonoscopy  HPI:  60 year old female with history of depression and stroke here to discuss colorectal cancer screening. Patient  thinks she may have had a colonoscopy many years ago but doesn't recall where or who did it. 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 Denies any nausea, vomiting, abdominal pain, melena or bright red blood per rectum. No family history of colon cancer    Outpatient Encounter Prescriptions as of 11/28/2015  Medication Sig  . aspirin EC 325 MG tablet Take 325 mg by mouth daily.  [name] atorvastatin (LIPITOR) 80 MG tablet TAKE ONE TABLET BY MOUTH ONCE DAILY  . baclofen (LIORESAL) 20 MG tablet TAKE ONE-HALF TABLET BY MOUTH THREE TIMES DAILY FOR  1  WEEK,  THEN  INCREASE  TO  TAKE  1  TAB  THREE  TIMES  DAILY  . clonazePAM (KLONOPIN) 1 MG tablet Take 1 tablet (1 mg total) by mouth at bedtime.  . diclofenac sodium (VOLTAREN) 1 % GEL Apply 2 g topically 4 (four) times daily.  [name] levETIRAcetam (KEPPRA) 500 MG tablet Take 1 tablet (500 mg total) by mouth 2 (two) times daily.  . Multiple Vitamin (MULTIVITAMIN) capsule Take 1 capsule by mouth daily.   . ondansetron (ZOFRAN) 4 MG tablet Take 1 tablet (4 mg total) by mouth every 8 (eight) hours as needed for nausea or vomiting.  . pramipexole (MIRAPEX) 0.5 MG tablet TAKE ONE TABLET BY MOUTH AT BEDTIME AS NEEDED  . pramipexole (MIRAPEX) 0.5 MG tablet TAKE ONE TABLET BY MOUTH AT BEDTIME AS NEEDED  . ranitidine (ZANTAC) 300 MG capsule Take 1 capsule (300 mg total) by mouth every evening.  . triamterene-hydrochlorothiazide (MAXZIDE) 75-50 MG tablet Take 0.5 tablets by mouth daily.  . Vilazodone HCl (VIIBRYD) 40 MG TABS Take 1 tablet (40 mg total) by mouth daily.   No facility-administered  encounter medications on file as of 11/28/2015.     Allergies as of 11/28/2015 - Review Complete 11/28/2015  Allergen Reaction Noted  . Codeine Itching 04/20/2011    Past Medical History:  Diagnosis Date  . Angina   . Breast cyst   . Depression   . Hypertension   . RLS (restless legs syndrome) 11/16/2011  . Seizures ([place])   . Stroke ([place]) 04/20/2011   a. 04/20/11 Left subcortical infarct treated w/ TPA;  b. 04/2011 Echo: EF 55-60%;  c. 04/2011 Normal Carotid u/s  d. Residual "walk w/limp on right; unable to grasp w/right hand"  . TIA (transient ischemic attack) 08/2010  . Tobacco abuse    a. quit @ time of CVA 04/2011.    Past Surgical History:  Procedure Laterality Date  . BREAST RECONSTRUCTION     bilaterally  . CARPAL TUNNEL RELEASE Left 1990's  . CESAREAN SECTION  1979  . KNEE ARTHROSCOPY Left 1990's    cartilage repair  . LEFT HEART CATHETERIZATION WITH CORONARY ANGIOGRAM N/A 05/20/2011   Procedure: LEFT HEART CATHETERIZATION WITH CORONARY ANGIOGRAM;  Surgeon: Josue Hector, MD;  Location: [facility] CATH LAB;  Service: Cardiovascular;  Laterality: N/A;  . MASTECTOMY Bilateral 1980's   bilateral with reconstruction    Family History  Problem Relation Age of Onset  . Lupus Mother     died early 37's.  [name]  Emphysema Father     died late 58's.  [name] COPD Father   . Pancreatic cancer Brother   . Bladder Cancer Brother   . Rectal cancer Brother     Social History   Social History  . Marital status: Widowed    Spouse name: N/A  . Number of children: 1  . Years of education: 73   Occupational History  . disabled    Social History Main Topics  . Smoking status: Current Every Day Smoker    Packs/day: 0.50    Years: 37.00    Types: Cigarettes  . Smokeless tobacco: Never Used     Comment: quit 04/20/2011, restarted in May '14  . Alcohol use 4.2 oz/week    7 Glasses of wine per week     Comment: very occasional  . Drug use: No  . Sexual activity: No   Other Topics  Concern  . Not on file   Social History Narrative   Pt lives alone.   Caffeine Use: 1-3 cups of caffeine daily (tea)      Review of systems: Review of Systems  Constitutional: Negative for fever and chills.  HENT: Negative.   Eyes: Negative for blurred vision.  Respiratory: Negative for cough, shortness of breath and wheezing.   Cardiovascular: Negative for chest pain and palpitations.  Gastrointestinal: as per HPI Genitourinary: Negative for dysuria, urgency, frequency and hematuria.  Musculoskeletal: Negative for myalgias, back pain and In positive for joint pain [left breast joint].  Skin: Negative for itching and rash.  Neurological: Negative for dizziness, tremors, focal weakness, seizures and loss of consciousness.  Endo/Heme/Allergies: Negative for seasonal allergies.  Psychiatric/Behavioral: Positive for insomnia, depression and anxiety, negative for suicidal ideas and hallucinations.  All other systems reviewed and are negative.   Physical Exam: Vitals:   11/28/15 1402  BP: 90/60  Pulse: 76   Body mass index is 25.4 kg/m. Gen:      No acute distress HEENT:  EOMI, sclera anicteric Neck:     No masses; no thyromegaly Lungs:    Clear to auscultation bilaterally; normal respiratory effort CV:         Regular rate and rhythm; no murmurs Abd:      + bowel sounds; soft, non-tender; no palpable masses, no distension Ext:    No edema; adequate peripheral perfusion Skin:      Warm and dry; no rash Neuro: alert and oriented x 3 Psych: normal mood and affect  Data Reviewed:  Reviewed labs, radiology imaging, old records and pertinent past GI work up   Assessment and Plan/Recommendations:  59 year old female with history of major depression and stroke on aspirin 325 mg here to discuss colorectal cancer screening Patient thinks she may have had a prior colonoscopy. We do not have any record of it We'll schedule for colonoscopy for colorectal cancer screening The  risks and benefits as well as alternatives of endoscopic procedure(s) have been discussed and reviewed. All questions answered. The patient agrees to proceed.  Greater than 50% of the time used for counseling as well as treatment plan and follow-up. She had multiple questions which were answered to her satisfaction  K. Denzil Magnuson , MD [phone] Mon-Fri 8a-5p [phone] after 5p, weekends, holidays  CC: Hoyt Koch, *

## 2015-11-28 NOTE — Patient Instructions (Signed)

## 2015-12-03 ENCOUNTER — Other Ambulatory Visit: Payer: Self-pay | Admitting: Internal Medicine

## 2015-12-04 ENCOUNTER — Telehealth: Payer: Self-pay | Admitting: Gastroenterology

## 2015-12-04 NOTE — Telephone Encounter (Signed)
Called patient to inform she will be getting a call when the Sample kits come in  Staff message sent to [facility]

## 2015-12-08 ENCOUNTER — Encounter: Payer: Self-pay | Admitting: Gastroenterology

## 2015-12-08 HISTORY — PX: CARPAL TUNNEL RELEASE: SHX101

## 2015-12-12 ENCOUNTER — Telehealth: Payer: Self-pay

## 2015-12-12 NOTE — Telephone Encounter (Signed)
Told patient that I would leave a Suprep sample up front to be picked up.  Patient agreed.

## 2015-12-17 ENCOUNTER — Telehealth: Payer: Self-pay | Admitting: Geriatric Medicine

## 2015-12-17 ENCOUNTER — Ambulatory Visit
Admission: RE | Admit: 2015-12-17 | Discharge: 2015-12-17 | Disposition: A | Discharge status: Home / Self Care | Payer: Medicare Other | Source: Ambulatory Visit | Attending: Internal Medicine | Admitting: Internal Medicine

## 2015-12-17 ENCOUNTER — Other Ambulatory Visit: Payer: Self-pay | Admitting: Internal Medicine

## 2015-12-17 DIAGNOSIS — N644 Mastodynia: Secondary | ICD-10-CM

## 2015-12-17 NOTE — Telephone Encounter (Signed)
Patient is in the office for a right breast US and is complaining of left breast pain as well. Please co-sign the order in the system for the left breast US and call back with verbal order. They are also requesting an order for a bilateral mammogram if patient has not had a mastectomy.

## 2015-12-17 NOTE — Telephone Encounter (Signed)
She has had bilateral mastectomy so no mammogram. Okay the verbal order and co-signed in the system.

## 2015-12-17 NOTE — Telephone Encounter (Signed)
Called and spoke to [place] and gave verbal order for a left breast US and informed her that the order has been co-signed in the system, but no mammogram because patient has had a double mastectomy.

## 2015-12-18 ENCOUNTER — Encounter: Payer: Self-pay | Admitting: Gastroenterology

## 2015-12-18 ENCOUNTER — Ambulatory Visit (AMBULATORY_SURGERY_CENTER): Payer: Medicare Other | Admitting: Gastroenterology

## 2015-12-18 VITALS — BP 128/82 | HR 68 | Temp 98.0°F | Resp 22 | Ht 62.0 in | Wt 140.0 lb

## 2015-12-18 DIAGNOSIS — K621 Rectal polyp: Secondary | ICD-10-CM

## 2015-12-18 DIAGNOSIS — C187 Malignant neoplasm of sigmoid colon: Secondary | ICD-10-CM

## 2015-12-18 DIAGNOSIS — Z1212 Encounter for screening for malignant neoplasm of rectum: Secondary | ICD-10-CM | POA: Diagnosis not present

## 2015-12-18 DIAGNOSIS — D127 Benign neoplasm of rectosigmoid junction: Secondary | ICD-10-CM

## 2015-12-18 DIAGNOSIS — K635 Polyp of colon: Secondary | ICD-10-CM | POA: Diagnosis not present

## 2015-12-18 DIAGNOSIS — Z1211 Encounter for screening for malignant neoplasm of colon: Secondary | ICD-10-CM | POA: Diagnosis present

## 2015-12-18 MED ORDER — SODIUM CHLORIDE 0.9 % IV SOLN: 500.0000 mL | INTRAVENOUS | Status: DC

## 2015-12-18 NOTE — Progress Notes (Signed)
Patient awakening,vss,report to rn 

## 2015-12-18 NOTE — Op Note (Signed)
[place] Patient Name: Anna Lyons Procedure Date: 12/18/2015 10:27 AM MRN: [id number] Endoscopist: Mauri Pole , MD Age: 60 Referring MD:  Date of Birth: Jul 25, 1955 Gender: Female Account #: [id number] Procedure:                Colonoscopy Indications:              Screening for colorectal malignant neoplasm, This                            is the patient's first colonoscopy Medicines:                Monitored Anesthesia Care Procedure:                Pre-Anesthesia Assessment:                           - Prior to the procedure, a History and Physical                            was performed, and patient medications and                            allergies were reviewed. The patient's tolerance of                            previous anesthesia was also reviewed. The risks                            and benefits of the procedure and the sedation                            options and risks were discussed with the patient.                            All questions were answered, and informed consent                            was obtained. Prior Anticoagulants: The patient has                            taken no previous anticoagulant or antiplatelet                            agents. ASA Grade Assessment: II - A patient with                            mild systemic disease. After reviewing the risks                            and benefits, the patient was deemed in                            satisfactory condition to undergo the procedure.  After obtaining informed consent, the colonoscope                            was passed under direct vision. Throughout the                            procedure, the patient's blood pressure, pulse, and                            oxygen saturations were monitored continuously. The                            Model PCF-H190DL [id number]) scope was introduced                            through the anus and  advanced to the the terminal                            ileum, with identification of the appendiceal                            orifice and IC valve. The colonoscopy was performed                            without difficulty. The patient tolerated the                            procedure well. The quality of the bowel                            preparation was good. The terminal ileum, ileocecal                            valve, appendiceal orifice, and rectum were                            photographed. Scope In: 10:32:13 AM Scope Out: 10:50:42 AM Scope Withdrawal Time: 0 hours 12 minutes 33 seconds  Total Procedure Duration: 0 hours 18 minutes 29 seconds  Findings:                 The perianal and digital rectal examinations were                            normal.                           Three sessile polyps were found in the                            recto-sigmoid colon. The polyps were 4 to 5 mm in                            size. These polyps were removed with a cold snare.  Resection and retrieval were complete.                           Non-bleeding internal hemorrhoids were found during                            retroflexion. The hemorrhoids were small.                           The exam was otherwise without abnormality. Complications:            No immediate complications. Estimated Blood Loss:     Estimated blood loss: none. Impression:               - Three 4 to 5 mm polyps at the recto-sigmoid                            colon, removed with a cold snare. Resected and                            retrieved.                           - Non-bleeding internal hemorrhoids.                           - The examination was otherwise normal. Recommendation:           - Patient has a contact number available for                            emergencies. The signs and symptoms of potential                            delayed complications were discussed with  the                            patient. Return to normal activities tomorrow.                            Written discharge instructions were provided to the                            patient.                           - Resume previous diet.                           - Continue present medications.                           - Await pathology results.                           - Repeat colonoscopy in 5-10 years for surveillance  based on pathology results.                           - Return to GI clinic PRN. Mauri Pole, MD 12/18/2015 10:57:24 AM This report has been signed electronically.

## 2015-12-18 NOTE — Patient Instructions (Signed)
YOU HAD AN ENDOSCOPIC PROCEDURE TODAY AT THE [facility]:   Refer to the procedure report that was given to you for any specific questions about what was found during the examination.  If the procedure report does not answer your questions, please call your gastroenterologist to clarify.  If you requested that your care partner not be given the details of your procedure findings, then the procedure report has been included in a sealed envelope for you to review at your convenience later.  YOU SHOULD EXPECT: Some feelings of bloating in the abdomen. Passage of more gas than usual.  Walking can help get rid of the air that was put into your GI tract during the procedure and reduce the bloating. If you had a lower endoscopy (such as a colonoscopy or flexible sigmoidoscopy) you may notice spotting of blood in your stool or on the toilet paper. If you underwent a bowel prep for your procedure, you may not have a normal bowel movement for a few days.  Please Note:  You might notice some irritation and congestion in your nose or some drainage.  This is from the oxygen used during your procedure.  There is no need for concern and it should clear up in a day or so.  SYMPTOMS TO REPORT IMMEDIATELY:   Following lower endoscopy (colonoscopy or flexible sigmoidoscopy):  Excessive amounts of blood in the stool  Significant tenderness or worsening of abdominal pains  Swelling of the abdomen that is new, acute  Fever of 100F or higher   For urgent or emergent issues, a gastroenterologist can be reached at any hour by calling [phone].   DIET:  We do recommend a small meal at first, but then you may proceed to your regular diet.  Drink plenty of fluids but you should avoid alcoholic beverages for 24 hours.  ACTIVITY:  You should plan to take it easy for the rest of today and you should NOT DRIVE or use heavy machinery until tomorrow (because of the sedation medicines used during the test).     FOLLOW UP: Our staff will call the number listed on your records the next business day following your procedure to check on you and address any questions or concerns that you may have regarding the information given to you following your procedure. If we do not reach you, we will leave a message.  However, if you are feeling well and you are not experiencing any problems, there is no need to return our call.  We will assume that you have returned to your regular daily activities without incident.  If any biopsies were taken you will be contacted by phone or by letter within the next 1-3 weeks.  Please call us at [phone] if you have not heard about the biopsies in 3 weeks.    SIGNATURES/CONFIDENTIALITY: You and/or your care partner have signed paperwork which will be entered into your electronic medical record.  These signatures attest to the fact that that the information above on your After Visit Summary has been reviewed and is understood.  Full responsibility of the confidentiality of this discharge information lies with you and/or your care-partner.  Read all of the handouts given to you by your recovery room nurse. Thank-you for choosing us for your healthcare needs today. 

## 2015-12-18 NOTE — Progress Notes (Signed)
Called to room to assist during endoscopic procedure.  Patient ID and intended procedure confirmed with present staff. Received instructions for my participation in the procedure from the performing physician.  

## 2015-12-19 ENCOUNTER — Telehealth: Payer: Self-pay

## 2015-12-19 ENCOUNTER — Other Ambulatory Visit: Payer: Self-pay | Admitting: Internal Medicine

## 2015-12-19 NOTE — Telephone Encounter (Signed)
  Follow up Call-  Call back number 12/18/2015  Post procedure Call Back phone  # [phone]  Permission to leave phone message Yes  Some recent data might be hidden     Patient questions:  Do you have a fever, pain , or abdominal swelling? No. Pain Score  0 *  Have you tolerated food without any problems? Yes.    Have you been able to return to your normal activities? Yes.    Do you have any questions about your discharge instructions: Diet   No. Medications  No. Follow up visit  No.  Do you have questions or concerns about your Care? No.  Actions: * If pain score is 4 or above: No action needed, pain <4.

## 2015-12-22 ENCOUNTER — Encounter: Payer: Medicare Other | Admitting: Gastroenterology

## 2015-12-24 ENCOUNTER — Ambulatory Visit: Payer: Medicare Other | Admitting: Internal Medicine

## 2016-01-02 ENCOUNTER — Encounter: Payer: Self-pay | Admitting: Gastroenterology

## 2016-01-29 ENCOUNTER — Other Ambulatory Visit: Payer: Self-pay | Admitting: Neurology

## 2016-01-29 ENCOUNTER — Other Ambulatory Visit: Payer: Self-pay | Admitting: Internal Medicine

## 2016-02-16 ENCOUNTER — Telehealth: Payer: Self-pay | Admitting: Emergency Medicine

## 2016-02-16 NOTE — Telephone Encounter (Signed)
Pt called and stated her prescription for Vilazodone HCl (VIIBRYD) 40 MG TABS is $200. She wants to know if you know of a cheaper way to get this prescription. Please advise thanks.

## 2016-02-16 NOTE — Telephone Encounter (Signed)
Contacted patient and stated that after calling the pharmacy, there is not a PA we can do for the med, that it has to do with her insurance plan. Patient stated awareness

## 2016-02-16 NOTE — Telephone Encounter (Signed)
Contacted patient to call back and schedule appt

## 2016-02-16 NOTE — Telephone Encounter (Signed)
Probably because she has medicare. Can we do a PA for this? Is one needed?

## 2016-02-16 NOTE — Telephone Encounter (Signed)
Patient would like something different called in to [facility] on [address]

## 2016-02-16 NOTE — Telephone Encounter (Signed)
We may need a visit to talk through other options since she has not done well with many other medicines.

## 2016-02-16 NOTE — Telephone Encounter (Signed)
Contacted patient and she stated that the pharmacy told her the coupons given last time don't work with her insurance.  Says you did something different last time that made it cheaper.

## 2016-02-16 NOTE — Telephone Encounter (Signed)
Have put a coupon on your desk, can call patient to pick up.

## 2016-02-20 ENCOUNTER — Encounter: Payer: Self-pay | Admitting: Internal Medicine

## 2016-02-20 ENCOUNTER — Ambulatory Visit (INDEPENDENT_AMBULATORY_CARE_PROVIDER_SITE_OTHER): Payer: Medicare Other | Admitting: Internal Medicine

## 2016-02-20 DIAGNOSIS — F332 Major depressive disorder, recurrent severe without psychotic features: Secondary | ICD-10-CM | POA: Diagnosis not present

## 2016-02-20 MED ORDER — BUPROPION HCL ER (SR) 150 MG PO TB12: 150.0000 mg | ORAL_TABLET | Freq: Two times a day (BID) | ORAL | 3 refills | Status: DC

## 2016-02-20 MED ORDER — BACLOFEN 20 MG PO TABS: ORAL_TABLET | ORAL | 3 refills | Status: DC

## 2016-02-20 NOTE — Assessment & Plan Note (Signed)
Rx for wellbutrin today to see if this will help. Viibryd has been helpful but she is not able to afford. She has tried and failed prozac, cymbalta, zoloft during this episode and tried and failed many others during past episodes. If no help will ask her to return to psych. She is not doing counseling now and asked her to return.

## 2016-02-20 NOTE — Progress Notes (Signed)
Pre visit review using our clinic review tool, if applicable. No additional management support is needed unless otherwise documented below in the visit note. 

## 2016-02-20 NOTE — Progress Notes (Signed)
   Subjective:    Patient ID: Anna Lyons, female    DOB: 1955/06/22, 61 y.o.   MRN: [id number]  HPI The patient is a 61 YO female coming in for follow up of her depression. She was taking viibryd which was helping for her symptoms some but not fully. However this is going to be too expensive for her to take. She wants something else. However she has done poorly on many other agents including cymbalta, zoloft, prozac, many other agents, has seen psych in the past for treatment. She is crying a lot still and not sleeping well at night. She is talking to family and trying not to isolate herself. Denies SI/HI.   Review of Systems  Constitutional: Positive for activity change, appetite change and fatigue. Negative for chills, diaphoresis, fever and unexpected weight change.  Respiratory: Positive for cough. Negative for shortness of breath and wheezing.        Chronic  Cardiovascular: Negative.   Gastrointestinal: Negative.   Psychiatric/Behavioral: Positive for decreased concentration, dysphoric mood and sleep disturbance. Negative for agitation, behavioral problems, confusion, self-injury and suicidal ideas. The patient is nervous/anxious.       Objective:   Physical Exam  Constitutional: She is oriented to person, place, and time. She appears well-developed and well-nourished.  HENT:  Head: Normocephalic and atraumatic.  Cardiovascular: Normal rate and regular rhythm.   Pulmonary/Chest: Effort normal and breath sounds normal. No respiratory distress. She has no wheezes. She has no rales.  Abdominal: Soft. Bowel sounds are normal. She exhibits no distension. There is no tenderness. There is no rebound.  Neurological: She is alert and oriented to person, place, and time.  Skin: Skin is warm and dry.  Psychiatric:  Tearful during the visit and flat affect   Vitals:   02/20/16 1250  BP: 110/62  Pulse: 77  Resp: 14  Temp: 97.5 F (36.4 C)  TempSrc: Oral  SpO2: 99%  Weight: 141 lb (64  kg)  Height: 5\' 2"  (1.575 m)      Assessment & Plan:

## 2016-02-20 NOTE — Patient Instructions (Signed)
Capsaicin cream can help with the burning.   Another option for the pain would be to get some lidocaine cream over the counter to help.   We have sent in the wellbutrin (bupropion) for the depression. For the first week take 1 pill daily, then increase to taking 1 pill twice a day.   Call us or come back in 1-2 months to let is know how you are doing.

## 2016-02-26 ENCOUNTER — Ambulatory Visit (INDEPENDENT_AMBULATORY_CARE_PROVIDER_SITE_OTHER): Payer: Medicare Other | Admitting: Internal Medicine

## 2016-02-26 ENCOUNTER — Encounter: Payer: Self-pay | Admitting: Internal Medicine

## 2016-02-26 DIAGNOSIS — J111 Influenza due to unidentified influenza virus with other respiratory manifestations: Secondary | ICD-10-CM | POA: Insufficient documentation

## 2016-02-26 DIAGNOSIS — R69 Illness, unspecified: Secondary | ICD-10-CM

## 2016-02-26 MED ORDER — HYDROCODONE-HOMATROPINE 5-1.5 MG/5ML PO SYRP: 5.0000 mL | ORAL_SOLUTION | Freq: Three times a day (TID) | ORAL | 0 refills | Status: DC | PRN

## 2016-02-26 MED ORDER — PREDNISONE 20 MG PO TABS: 40.0000 mg | ORAL_TABLET | Freq: Every day | ORAL | 0 refills | Status: DC

## 2016-02-26 NOTE — Patient Instructions (Signed)
We have sent in the prednisone to jump start the lungs. Take 2 pills daily for 5 days then stop.  We have given you the cough medicine to use at night time to help you sleep.

## 2016-02-26 NOTE — Progress Notes (Signed)
Pre visit review using our clinic review tool, if applicable. No additional management support is needed unless otherwise documented below in the visit note. 

## 2016-02-26 NOTE — Progress Notes (Signed)
   Subjective:    Patient ID: Anna Lyons, female    DOB: [birth year], 61 y.o.   MRN: [id number]  HPI The patient is a 61 YO female coming in for cold symptoms since Monday. She is having SOB and cough. She is having sinus congestion and drainage. No hearing changes. Some chills but no fevers. Sick contacts at work. Fatigue. No muscle aches. Has tried dayquil and nyquil without relief.   Review of Systems  Constitutional: Positive for activity change, chills and fatigue. Negative for appetite change, diaphoresis and fever.  HENT: Positive for congestion, postnasal drip, rhinorrhea and sinus pressure. Negative for ear discharge, ear pain, sinus pain, sore throat and trouble swallowing.   Eyes: Negative.   Respiratory: Positive for cough, shortness of breath and wheezing. Negative for chest tightness.   Cardiovascular: Negative.   Gastrointestinal: Negative.   Musculoskeletal: Negative.       Objective:   Physical Exam  Constitutional: She is oriented to person, place, and time. She appears well-developed and well-nourished.  HENT:  Head: Normocephalic and atraumatic.  TMs normal, oropharynx with redness and clear drainage, no nose crusting, mild frontal sinus tenderness.   Eyes: EOM are normal.  Neck: Normal range of motion. No JVD present.  Cardiovascular: Normal rate and regular rhythm.   Pulmonary/Chest: Effort normal.  Scattered rhonchi in the lungs which partially clears with cough.   Abdominal: Soft.  Lymphadenopathy:    She has no cervical adenopathy.  Neurological: She is alert and oriented to person, place, and time.  Skin: Skin is warm and dry.   Vitals:   02/26/16 1536  BP: 110/60  Pulse: 72  Resp: 14  Temp: 98.3 F (36.8 C)  TempSrc: Oral  SpO2: 98%  Weight: 140 lb (63.5 kg)  Height: 5\' 2"  (1.575 m)      Assessment & Plan:

## 2016-02-26 NOTE — Assessment & Plan Note (Signed)
Rx for prednisone for rhonchi in the lungs. Outside the window for tamiflu. Rx for hycodan for cough so she can sleep. Talked to her about symptom management and expected course.

## 2016-03-02 ENCOUNTER — Other Ambulatory Visit: Payer: Self-pay | Admitting: Internal Medicine

## 2016-03-04 ENCOUNTER — Encounter: Payer: Self-pay | Admitting: Internal Medicine

## 2016-03-04 ENCOUNTER — Ambulatory Visit (INDEPENDENT_AMBULATORY_CARE_PROVIDER_SITE_OTHER): Payer: Medicare Other | Admitting: Internal Medicine

## 2016-03-04 DIAGNOSIS — J209 Acute bronchitis, unspecified: Secondary | ICD-10-CM | POA: Diagnosis not present

## 2016-03-04 DIAGNOSIS — R05 Cough: Secondary | ICD-10-CM | POA: Insufficient documentation

## 2016-03-04 DIAGNOSIS — R059 Cough, unspecified: Secondary | ICD-10-CM | POA: Insufficient documentation

## 2016-03-04 MED ORDER — DOXYCYCLINE HYCLATE 100 MG PO TABS: 100.0000 mg | ORAL_TABLET | Freq: Two times a day (BID) | ORAL | 0 refills | Status: DC

## 2016-03-04 NOTE — Progress Notes (Signed)
   Subjective:    Patient ID: Anna Lyons, female    DOB: January 09, 1956, 61 y.o.   MRN: [id number]  HPI The patient is a 61 YO female coming in for ongoing cough and SOB. She was seen and treated for flu-like illness last week. She was given prednisone and completed this yesterday. She is still having SOB with exertion. She is still coughing a significant amount and getting out of breath while coughing. Denies vomiting from coughing. She denies fevers but still having some chills. She is having mild congestion which is gradually improving. Still having productive cough.   Review of Systems  Constitutional: Positive for activity change, appetite change, chills and fatigue. Negative for fever and unexpected weight change.  HENT: Positive for congestion and rhinorrhea. Negative for ear discharge, ear pain, postnasal drip, sinus pain, sinus pressure, sore throat and trouble swallowing.   Eyes: Negative.   Respiratory: Positive for cough and shortness of breath. Negative for chest tightness and wheezing.   Cardiovascular: Negative.   Gastrointestinal: Negative.   Musculoskeletal: Negative.       Objective:   Physical Exam  Constitutional: She appears well-developed and well-nourished.  HENT:  Head: Normocephalic and atraumatic.  Mild redness oropharynx with clear drainage, no nasal crusting  Eyes: EOM are normal.  Neck: Normal range of motion.  Cardiovascular: Normal rate and regular rhythm.   Pulmonary/Chest: Effort normal. No respiratory distress. She has no wheezes.  Some rhonchi in the left lung, right lung clear  Abdominal: Soft.  Skin: Skin is warm and dry.   Vitals:   03/04/16 1056  BP: (!) 90/50  Pulse: 65  Resp: 16  Temp: 97.6 F (36.4 C)  TempSrc: Oral  SpO2: 96%  Weight: 139 lb 8 oz (63.3 kg)  Height: 5\' 2"  (1.575 m)      Assessment & Plan:

## 2016-03-04 NOTE — Assessment & Plan Note (Signed)
Still with changes in the left lung on exam. No wheezing so no indication for more prednisone. Rx for doxycycline to cover CAP as well. Overall congestion symptoms are gradually improving and reminded her that the cough could last another several weeks but SOB should improve.

## 2016-03-04 NOTE — Patient Instructions (Signed)
We have sent in the antibiotic called doxycycline. Take 1 pill twice a day for 1 week to help with the breathing.   The cough could take another 1-2 weeks to clear up.

## 2016-03-04 NOTE — Progress Notes (Signed)
Pre visit review using our clinic review tool, if applicable. No additional management support is needed unless otherwise documented below in the visit note. 

## 2016-03-11 IMAGING — DX DG HAND COMPLETE 3+V*R*
3 series · 3 of 3 positions shown · non-contrast
Comparison: None.

CLINICAL DATA: Bruising of the right hand status post fall.

EXAM:
RIGHT HAND - COMPLETE 3+ VIEW

[hand pa]
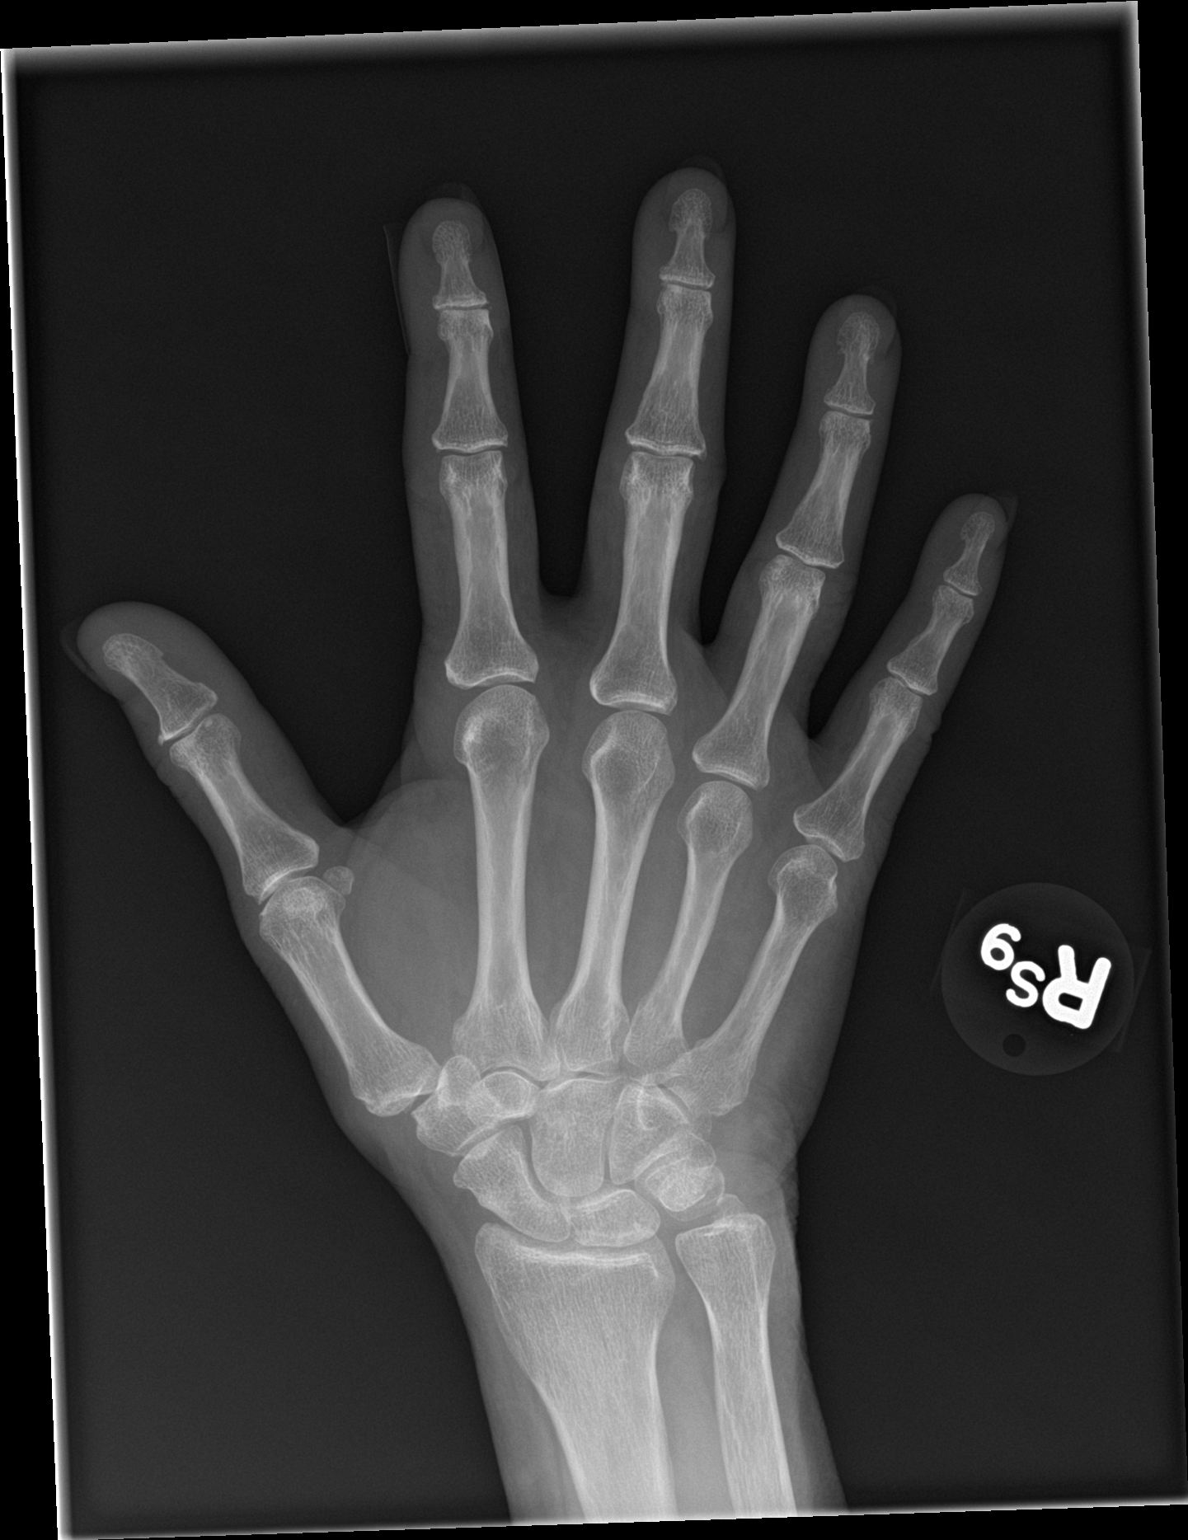

[hand obl]
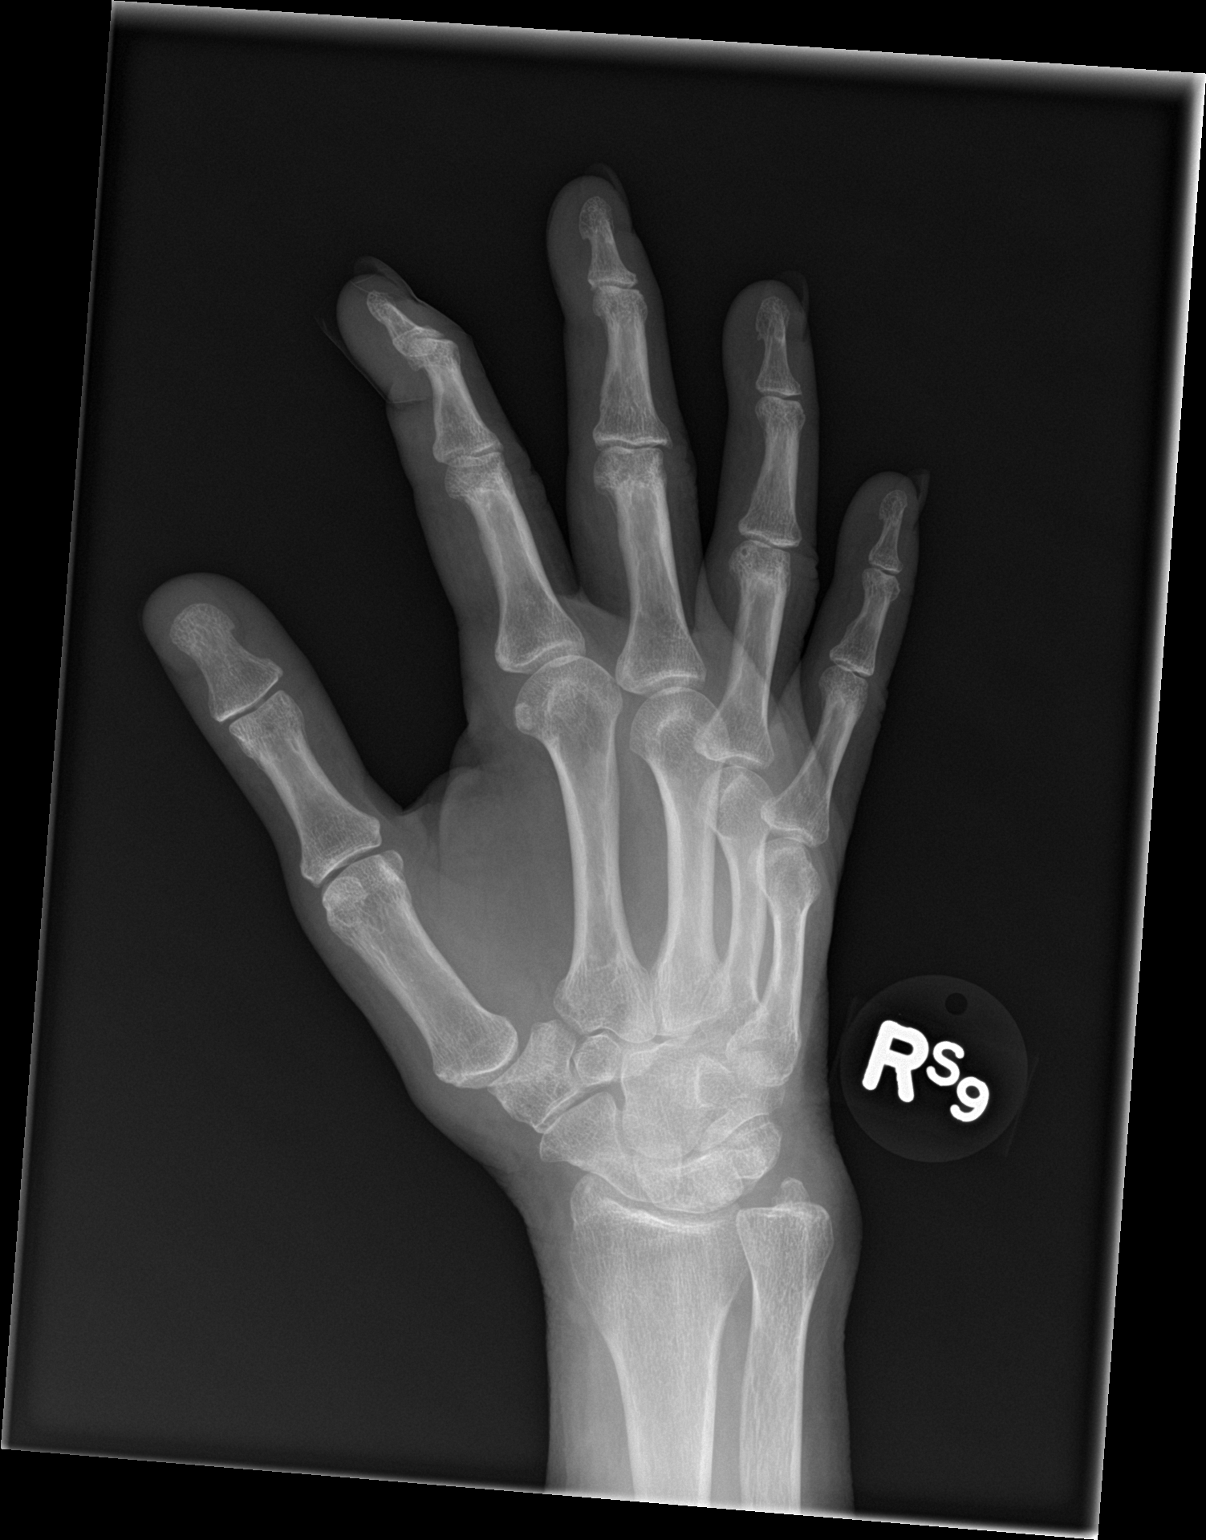

[hand lat]
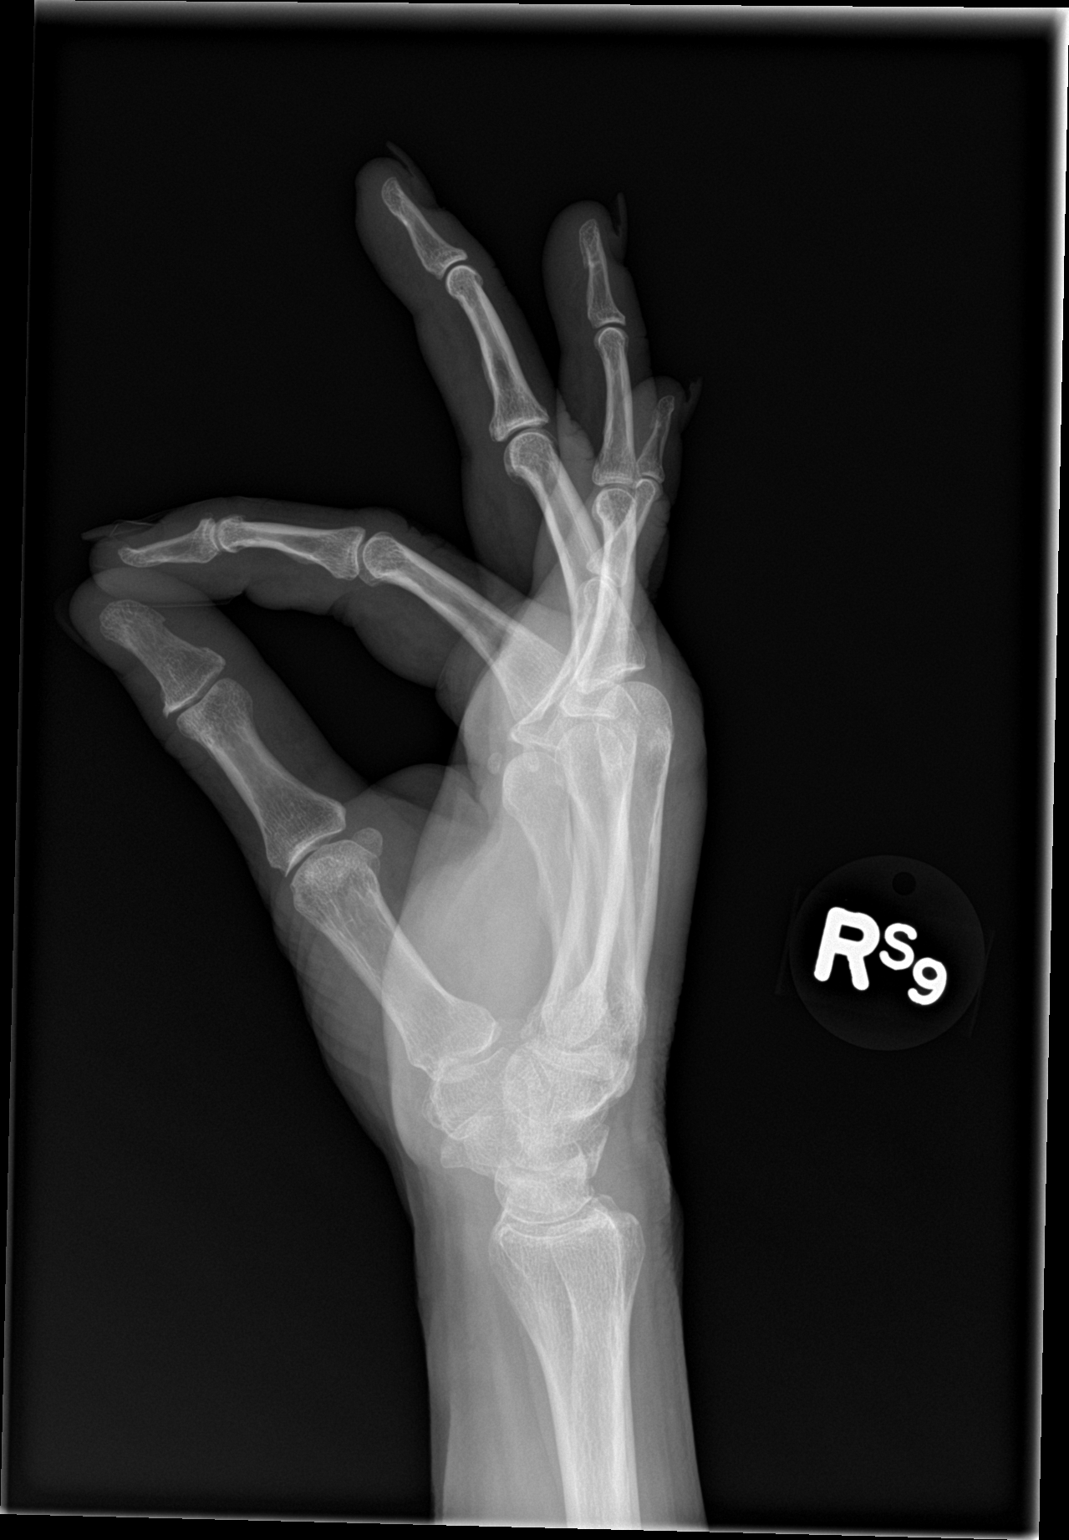

[3 of 3 positions shown; findings below may reference images not displayed]

FINDINGS: No evidence of acute fracture or subluxation. No focal bone lesion
or bone destruction. Bone cortex and trabecular architecture appear
intact. Mild osteoarthritic changes of the first carpometacarpal
joints are seen. No radiopaque soft tissue foreign bodies.
IMPRESSION: No acute fracture or dislocation identified about the right Hand.

Mild osteoarthritic changes of the first right carpometacarpal
joint.

## 2016-03-14 ENCOUNTER — Other Ambulatory Visit: Payer: Self-pay | Admitting: Internal Medicine

## 2016-03-14 ENCOUNTER — Other Ambulatory Visit: Payer: Self-pay | Admitting: Neurology

## 2016-03-15 ENCOUNTER — Other Ambulatory Visit: Payer: Self-pay

## 2016-03-15 MED ORDER — LEVETIRACETAM 500 MG PO TABS: 500.0000 mg | ORAL_TABLET | Freq: Two times a day (BID) | ORAL | 0 refills | Status: DC

## 2016-03-29 ENCOUNTER — Ambulatory Visit (INDEPENDENT_AMBULATORY_CARE_PROVIDER_SITE_OTHER): Payer: Medicare Other | Admitting: Neurology

## 2016-03-29 ENCOUNTER — Encounter: Payer: Self-pay | Admitting: Neurology

## 2016-03-29 VITALS — BP 125/60 | HR 85 | Wt 138.6 lb

## 2016-03-29 DIAGNOSIS — G2581 Restless legs syndrome: Secondary | ICD-10-CM

## 2016-03-29 DIAGNOSIS — R202 Paresthesia of skin: Secondary | ICD-10-CM

## 2016-03-29 DIAGNOSIS — F3289 Other specified depressive episodes: Secondary | ICD-10-CM

## 2016-03-29 NOTE — Progress Notes (Signed)
PATIENT: LENEA Lyons DOB: 1955/04/25  REASON FOR VISIT: follow up- stroke, seizures HISTORY FROM: patient  HISTORY OF PRESENT ILLNESS: Anna Lyons is a 61 year old female with a history of stroke. She returns today for follow-up. The patient continues on aspirin for stroke prevention. She denies any significant bruising or bleeding. The patient's blood pressure and cholesterol has been managed by her primary care provider. Her blood pressure is in good control today. The patient continues to use baclofen for spasticity and she reports that this is working well. Her primary care provides her with Klonopin to help with restless leg symptoms, she was most recently  started on Mirapex in addition to the Cobre. Patient reports that since the last visit she had a fall. This occurred in September. She states that she does not recall any events surrounding the fall other than that she was going to the bathroom and apparently  fell and hit her head. She did call her daughter who came and took her to the emergency room. Patient states that she has  trouble with her balance and she did not use her walker when she went to the bathroom. She states that in the emergency room she was diagnosed with a concussion and had a hematoma from the fall. Since then the patient has continued to have a headache daily. She is also very sleepy throughout the day. She denies any other symptoms. She denies any seizure events. She is continued to take Keppra 250 mg twice a day. After her fall she denies biting her tongue or loss of bowels or bladder. She returns today for an evaluation.  HISTORY 05/14/14:UPDATE 10/19/13 (LL): Patient returns for stroke followup accompanied by her daughter. She reports that she had stroke like symptoms in July and was admitted to [facility]. Stroke workup was negative and was found to have a UTI. She started back smoking. She is having increased fatigue, stating she feels like sleeping all day. She claims  she does not sleep soundly at night and never wakes up feeling refreshed. Her daughter states she snores loudly. Blood pressure is well controlled, it is 105/66 in the office today. Dr. Arelia Sneddon is treating her RLS with Klonopin, which she states relieves her symptoms. She is on her last day of Amoxicillin for left ear infection, but states she feels as though she cannot hear; everything sounds muffled. She is tolerating aspirin well with no signs of significant bleeding or bruising.  UPDATE 04/04/13 (LL): Patient comes in for revisit. She has had increased pain in right shoulder and very decreased ROM, and feels like she is dragging her right foot, but denies falls. States she feels like she lays in bed all day due to the pain in her right shoulder, then cannot sleep at night. Denies falls since last visit. BP is well controlled, is 111/71 in office today. Tolerating Keppra and aspirin well without side effects.  UPDATE 12/01/12 (LL): [name] comes to office for stroke revisit. She reports that she has been having increased trouble with balance in the last couple months, with 3-4 falls in the last month which sent her to the ER. Repeat MRI did not show new stroke. She states she feels like she is going to the side when she walks. She has increased pain in right shoulder and decreased ROM, and feels like she is dragging her right foot sometimes. She is suffering with insomnia, waking frequently through the night, and was prescribed Clonazepam by her PCP. Her depression is  much better since last visit.  UPDATE 08/28/12 (LL): [name] returns to office for follow up of seizures (2) which occurred on 05/19/12. They occurred while patient was staying with sick husband in hospital, was sleep deprived and stressed out. Husband subsequently died one month ago. Patient has not had anymore seizures, is maintained on Keppra 500mg  BID. No new neurovascular symptoms. Not sleeping well per daughter, patient has been  very depressed since husband's death. Zoloft was increased by PCP. RLS symptoms worse, needs refill on Requip. Dr. Arelia Sneddon manages HTN and cholesterol, patient does not know last lab values. Daughter reports BP med was recently changed due to hypokalemia. Patient reportedly stating smoking again when husband was terminal with cancer. Plans on quitting asap. Patient c/o right shoulder pain, weak from stroke and exacerbated by moving/carrying boxes.  Anna Lyons is a 61 y.o. 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 The first happened on the floor of the hospital that she was visiting her husband who was a patient. She is staying with him and was incontinent of both stool and urine and had what was reported to be should shaking activity. She was then taken to the emergency room where she was witnessed to have right head and eye deviation followed by tonic-clonic activity. She was given Ativan and Versed. MRI showed only evidence of a prior CVA, and EEG was unremarkable. The patient's seizure was likely a combination of a lowered seizure threshold, due to sleep deprivation in the context of visiting her hospitalized husband, and a predisposition to seizures, possibly related to her old CVA. The patient was started on Keppra, with no further seizure activity during the hospitalization.  02/17/12 PRIOR HPI (PS): 61 year old female was last seen in hospital for stroke or March of 2013. At that time she received TPA. Workup showed normal carotid Dopplers, 2-D echo showed 55-60% EF, negative CT angiogram extracranial vessels, negative CT brain, and positive stroke in the left posterior lentiform nucleus seen on MRI. She was discharged on aspirin 325 mg at that time. She was doing well until 11/16/2011 a.m. She awoke at 8 AM in normal state. She ate breakfast at 10:30 and  noted she had difficulty chewing her food. She brushed her teeth at 11 AM and noticed a right facial droop. No other symptoms. Initial CT head showed no acute infarct or bleed. Facial droop was still present. During further exam her facial droop improved and she is now back to her baseline. Initial NIH SS of 2 for right facial droop and pronator drift. CT of the brain 11/16/2011 remote left basal ganglia lacunar infarction. Mild white matter changes as above. No definite CT evidence for acute intracranial abnormality. MRI of the brain Kober 15 2013 chronic left hemisphere infarction as described. Small foci of restricted diffusion in the left periventricular region could represent acute or chronic ischemia. Mild atrophy and mild chronic microvascular ischemic change. Chest x-ray 11/16/2011 cardiomegaly without congestive failure.  Update 05/14/2014 : She returns for follow-up after last visit 6 months ago. She is a complaint bad daughter. She remains in a logical stable without recurrent stroke or TIA symptoms. She states her blood pressure is under good control. Last lipid profile checked several months ago was fine. She remains on aspirin which is tolerating well without bleeding, bruising. She has not had any seizures and she left the  hospital. At last visit the Matheny dose was reduced and she is tolerating well without breakthrough seizures. She remains on baclofen 20 mg 3 times daily which seems to help her spasticity and she is tolerating it well. She has noted increase in restless leg symptoms and she takes Klonopin 1 mg at night which seems to help. She has tried Requip in the past which has not work for her. She was recently changed from Zoloft to Prozac for depression and it seems to be helping. She has not had any carotid Dopplers done for nearly a year. She is able to walk with a wheeled walker and has had no recent falls.  Update 05/26/2015 : She returns for follow-up after last visit 1 year ago with  nurse practitioner. Patient has a new complaint of worsening of her feet paresthesias particularly since she started a new part-time job in which she has to stay on her feet for 4 hours. She reports significant burning of her toes as well as the soles mostly in the right leg but the left leg also to lesser degree. She does have a diagnosis of restless leg syndrome and takes Klonopin and Mirapex at night but has not yet tried taking either medication during the daytime. She does admit to her depression be not adequately treated. She has been on Cymbalta 60 mg daily since the death of her husband 3 years ago. She has not had any falls or injuries. She has been taking baclofen 20 mg 3 times daily for her post stroke spasticity and walking difficulty and is tolerating it well without any side effects. She is worried that she may have peripheral neuropathy and has not had nerve conduction evaluation for that Update 03/29/2016 ; she returns for follow-up after last visit 10 months ago. She is doing well without recurrent stroke or TIA symptoms since November 2013. She states she had a possible TIA last year and was seen at[facility] and went home. She complains of decreased energy and poor stamina. Memory is also been quite poor. Patient was previously on Cymbalta for depression and previously had failed Zoloft. She was recently switched to Wellbutrin 150 mg twice daily but she feels her depression is still not adequately treated. She has not been working. She is quite emotional. She cries easily. She has been managed for depression by her primary care physician Dr. Sharlet Salina. She has not seen a psychiatrist. Patient continued to tolerate Keppra well and has not had any breakthrough seizures this year. She states her blood pressure is well controlled today it is 125/60. She is tolerating Lipitor well without muscle aches or pains. She complains of some burning in her feet and his seeing a foot doctor for that. She did wear her  orthotic implant but that does not seem to affect her walking. She has had no conduction EMG done in the past by Dr. Charlestine Night which was apparently not suggestive of neuropathy. She recently had surgery in the left hand for trigger finger. REVIEW OF SYSTEMS: Out of a complete 14 system review of symptoms, the patient complains only of the following symptoms, and all other reviewed systems are negative.  Restless leg, insomnia, numbness, walking difficulty, confusion, depression, nervousness and anxiety and all other systems negative ALLERGIES: Allergies  Allergen Reactions  . Codeine Itching    HOME MEDICATIONS: Outpatient Medications Prior to Visit  Medication Sig Dispense Refill  . aspirin EC 325 MG tablet Take 325 mg by mouth daily.    [name]  atorvastatin (LIPITOR) 80 MG tablet TAKE ONE TABLET BY MOUTH ONCE DAILY 90 tablet 0  . baclofen (LIORESAL) 20 MG tablet TAKE ONE-HALF TABLET BY MOUTH THREE TIMES DAILY FOR  1  WEEK,  THEN  INCREASE  TO  TAKE  1  TAB  THREE  TIMES  DAILY 90 tablet 3  . buPROPion (WELLBUTRIN SR) 150 MG 12 hr tablet Take 1 tablet (150 mg total) by mouth 2 (two) times daily. 60 tablet 3  . clonazePAM (KLONOPIN) 1 MG tablet Take 1 tablet (1 mg total) by mouth at bedtime. 30 tablet 1  . diclofenac sodium (VOLTAREN) 1 % GEL Apply 2 g topically 4 (four) times daily. 100 g 3  . doxycycline (VIBRA-TABS) 100 MG tablet Take 1 tablet (100 mg total) by mouth 2 (two) times daily. 14 tablet 0  . ibuprofen (ADVIL,MOTRIN) 800 MG tablet Take 1 tablet by mouth as needed.  1  . levETIRAcetam (KEPPRA) 500 MG tablet Take 1 tablet (500 mg total) by mouth 2 (two) times daily. 90 tablet 0  . Multiple Vitamin (MULTIVITAMIN) capsule Take 1 capsule by mouth daily.     . ondansetron (ZOFRAN) 4 MG tablet Take 1 tablet (4 mg total) by mouth every 8 (eight) hours as needed for nausea or vomiting. 6 tablet 0  . pramipexole (MIRAPEX) 0.5 MG tablet TAKE ONE TABLET BY MOUTH AT BEDTIME AS NEEDED 30 tablet 1  .  ranitidine (ZANTAC) 300 MG capsule Take 1 capsule (300 mg total) by mouth every evening. 30 capsule 5  . traZODone (DESYREL) 100 MG tablet Take 1 tablet by mouth at bedtime.  2  . triamterene-hydrochlorothiazide (MAXZIDE) 75-50 MG tablet Take 0.5 tablets by mouth daily. (Patient taking differently: Take 0.5 tablets by mouth as needed. ) 45 tablet 6  . pramipexole (MIRAPEX) 0.5 MG tablet TAKE ONE TABLET BY MOUTH AT BEDTIME AS NEEDED (Patient not taking: Reported on 03/29/2016) 30 tablet 1  . traZODone (DESYREL) 100 MG tablet TAKE ONE TABLET BY MOUTH AT BEDTIME (Patient not taking: Reported on 03/29/2016) 30 tablet 3   No facility-administered medications prior to visit.     PAST MEDICAL HISTORY: Past Medical History:  Diagnosis Date  . Angina   . Breast cyst   . Cancer ([place])   . Depression   . Hypertension   . RLS (restless legs syndrome) 11/16/2011  . Seizures ([place]) 2016   last seizure="last year, 2016" per pt.  . Stroke ([place]) 04/20/2011   a. 04/20/11 Left subcortical infarct treated w/ TPA;  b. 04/2011 Echo: EF 55-60%;  c. 04/2011 Normal Carotid u/s  d. Residual "walk w/limp on right; unable to grasp w/right hand"  . TIA (transient ischemic attack) 08/2010  . Tobacco abuse    a. quit @ time of CVA 04/2011.    PAST SURGICAL HISTORY: Past Surgical History:  Procedure Laterality Date  . BREAST RECONSTRUCTION     bilaterally  . CARPAL TUNNEL RELEASE Left 1990's  . CARPAL TUNNEL RELEASE Left 12/08/2015  . CESAREAN SECTION  1979  . KNEE ARTHROSCOPY Left 1990's    cartilage repair  . LEFT HEART CATHETERIZATION WITH CORONARY ANGIOGRAM N/A 05/20/2011   Procedure: LEFT HEART CATHETERIZATION WITH CORONARY ANGIOGRAM;  Surgeon: Josue Hector, MD;  Location: [facility] CATH LAB;  Service: Cardiovascular;  Laterality: N/A;  . MASTECTOMY Bilateral 1980's   bilateral with reconstruction    FAMILY HISTORY: Family History  Problem Relation Age of Onset  . Lupus Mother     died early 63's.  [name]  Emphysema  Father     died late 95's.  [name] COPD Father   . Pancreatic cancer Brother   . Bladder Cancer Brother   . Rectal cancer Brother   . Colon cancer Neg Hx     SOCIAL HISTORY: Social History   Social History  . Marital status: Widowed    Spouse name: N/A  . Number of children: 1  . Years of education: 33   Occupational History  . disabled    Social History Main Topics  . Smoking status: Current Every Day Smoker    Packs/day: 0.50    Years: 37.00    Types: Cigarettes  . Smokeless tobacco: Never Used  . Alcohol use 4.2 oz/week    7 Glasses of wine per week     Comment: very occasional  . Drug use: No  . Sexual activity: No   Other Topics Concern  . Not on file   Social History Narrative   Pt lives alone.   Caffeine Use: 1-3 cups of caffeine daily (tea)      PHYSICAL EXAM  Vitals:   03/29/16 1120  BP: 125/60  Pulse: 85  Weight: 138 lb 9.6 oz (62.9 kg)   Body mass index is 25.35 kg/m.  Generalized: Well developed, in no acute distress  Surgical scar noted in the left forearm from recent surgery  Neurological examination  Mentation: Alert oriented to time, place,  slightly diminished attention and short-term memory.. Follows all commands speech and language fluent Cranial nerve II-XII: Pupils were equal round reactive to light. Extraocular movements were full, visual field were full on confrontational test. Facial sensation and strength were normal. Uvula tongue midline. Head turning and shoulder shrug  were normal and symmetric. Motor: The motor testing reveals 5 over 5 strength of all 4 extremities except some mild weakness in RLE. Good symmetric motor tone is noted throughout. Diminished fine finger movements on the right. Orbits left over right upper extremity. Sensory: Diminished touch and pinprick sensation in both feet over the toes only. Diminished vibration sensation over toes bilaterally. Coordination: Cerebellar testing reveals good finger-nose-finger and  heel-to-shin bilaterally.  Gait and station: Patient walks with a slight foot dragging on the right. Tandem gait slightly unsteady.  Reflexes: Deep tendon reflexes are symmetric and normal bilaterally except diminished right ankle jerk.   DIAGNOSTIC DATA (LABS, IMAGING, TESTING) - I reviewed patient records, labs, notes, testing and imaging myself where available.  Lab Results  Component Value Date   WBC 5.9 10/07/2015   HGB 12.9 10/07/2015   HCT 37.4 10/07/2015   MCV 92.6 10/07/2015   PLT 204.0 10/07/2015      Component Value Date/Time   NA 139 03/05/2015 1210   K 3.5 03/05/2015 1210   CL 101 03/05/2015 1210   CO2 28 03/05/2015 1210   GLUCOSE 96 03/05/2015 1210   BUN 14 03/05/2015 1210   CREATININE 0.70 03/05/2015 1210   CALCIUM 10.1 03/05/2015 1210   PROT 7.1 10/07/2015 1344   ALBUMIN 4.1 10/07/2015 1344   AST 19 10/07/2015 1344   ALT 14 10/07/2015 1344   ALKPHOS 69 10/07/2015 1344   BILITOT 0.3 10/07/2015 1344   GFRNONAA >90 04/09/2014 1446   GFRAA >90 04/09/2014 1446   Lab Results  Component Value Date   CHOL 132 12/20/2013   HDL 46.30 12/20/2013   LDLCALC 58 12/20/2013   TRIG 139.0 12/20/2013   CHOLHDL 3 12/20/2013   Lab Results  Component Value Date   HGBA1C 5.8 03/05/2015  Lab Results  Component Value Date   [id number] 10/15/2015   Lab Results  Component Value Date   TSH 2.84 10/15/2015      ASSESSMENT AND PLAN 61 y.o. year old female  has a past medical history of Angina; Breast cyst; Cancer ([place]); Depression; Hypertension; RLS (restless legs syndrome) (11/16/2011); Seizures ([place]) (2016); Stroke ([place]) (04/20/2011); TIA (transient ischemic attack) (08/2010); and Tobacco abuse. here with:  1. History of stroke 2. Seizures 3. Fall 4. Headache I had a long discussion the patient regarding her remote stroke and TIAs, risk for recurrence, prevention plan and answered questions. Continue aspirin for stroke prevention and strict control of lipids  with LDL cholesterol goal below 670 mg percent and hypertension with blood pressure goal below 130/90. Continue Keppra and the current dose of 500 mg twice daily for seizure prevention. Patient has treatment refractory depression and has failed trials in the past of Zoloft, Cymbalta and now Wellbutrin. I recommend referral to psychiatry for optimal depression management. She will return for follow-up in 6 months with my nurse practitioner call earlier if necessary. Greater than 50% time during this 30 minute visit was spent on counseling and coordination of care about her restless legs, paresthesias in her feet and walking difficulty.She'll return for follow-up in 3 months or call earlier if necessary Antony Contras, MD    03/29/2016, 5:31 PM [facility] [address], [place], [place] [address] [phone]

## 2016-03-29 NOTE — Patient Instructions (Addendum)
I had a long discussion the patient regarding her remote stroke and TIAs, risk for recurrence, prevention plan and answered questions. Continue aspirin for stroke prevention and strict control of lipids with LDL cholesterol goal below 670 mg percent and hypertension with blood pressure goal below 130/90. Continue Keppra and the current dose of 500 mg twice daily for seizure prevention. Patient has treatment refractory depression and has failed trials in the past of Zoloft, Cymbalta and now Wellbutrin. I recommend referral to psychiatry for optimal depression management. She will return for follow-up in 6 months with my nurse practitioner call earlier if necessary

## 2016-04-15 ENCOUNTER — Ambulatory Visit (INDEPENDENT_AMBULATORY_CARE_PROVIDER_SITE_OTHER)
Admission: RE | Admit: 2016-04-15 | Discharge: 2016-04-15 | Disposition: A | Discharge status: Home / Self Care | Payer: Medicare Other | Source: Ambulatory Visit | Attending: Internal Medicine | Admitting: Internal Medicine

## 2016-04-15 ENCOUNTER — Encounter: Payer: Self-pay | Admitting: Internal Medicine

## 2016-04-15 ENCOUNTER — Ambulatory Visit (INDEPENDENT_AMBULATORY_CARE_PROVIDER_SITE_OTHER): Payer: Medicare Other | Admitting: Internal Medicine

## 2016-04-15 VITALS — BP 110/62 | HR 68 | Temp 97.7°F | Ht 62.0 in | Wt 134.0 lb

## 2016-04-15 DIAGNOSIS — R059 Cough, unspecified: Secondary | ICD-10-CM

## 2016-04-15 DIAGNOSIS — R05 Cough: Secondary | ICD-10-CM

## 2016-04-15 MED ORDER — PROMETHAZINE-DM 6.25-15 MG/5ML PO SYRP: 5.0000 mL | ORAL_SOLUTION | Freq: Every evening | ORAL | 0 refills | Status: DC | PRN

## 2016-04-15 MED ORDER — LEVOCETIRIZINE DIHYDROCHLORIDE 5 MG PO TABS: 5.0000 mg | ORAL_TABLET | Freq: Every evening | ORAL | 3 refills | Status: DC

## 2016-04-15 NOTE — Progress Notes (Signed)
   Subjective:    Patient ID: Anna Lyons, female    DOB: 10/19/1955, 61 y.o.   MRN: [id number]  HPI The patient is a 61 YO female coming in for cold symptoms for about 2 weeks now. She is still smoking. Having nose congestion and drainage. Treated for infection back in February and had almost complete recovery. She is not taking allergy medicine at this time. She is using robitussin at this time. No fevers or chills. No SOB. Cough is mildly productive. Having nasal drainage.   Review of Systems  Constitutional: Positive for activity change and fatigue. Negative for appetite change, chills, fever and unexpected weight change.  HENT: Positive for congestion, postnasal drip and rhinorrhea. Negative for ear discharge, ear pain, sinus pain, sinus pressure, sore throat and trouble swallowing.   Eyes: Negative.   Respiratory: Positive for cough. Negative for chest tightness, shortness of breath and wheezing.   Cardiovascular: Negative.   Gastrointestinal: Negative.   Musculoskeletal: Negative.       Objective:   Physical Exam  Constitutional: She appears well-developed and well-nourished.  HENT:  Head: Normocephalic and atraumatic.  Right Ear: External ear normal.  Left Ear: External ear normal.  Oropharynx with redness and clear drainage, nose without crusting, no sinus pressure.   Eyes: EOM are normal.  Neck: Normal range of motion. No JVD present.  Cardiovascular: Normal rate and regular rhythm.   Pulmonary/Chest: Effort normal and breath sounds normal. No respiratory distress. She has no wheezes. She has no rales.  Abdominal: Soft.  Lymphadenopathy:    She has no cervical adenopathy.   Vitals:   04/15/16 1318  BP: 110/62  Pulse: 68  Temp: 97.7 F (36.5 C)  TempSrc: Oral  SpO2: 99%  Weight: 134 lb (60.8 kg)  Height: 5\' 2"  (1.575 m)      Assessment & Plan:

## 2016-04-15 NOTE — Patient Instructions (Signed)
We are checking the chest x-ray today and will call you with the results.  We have sent in cough medicine to use as needed.   We have sent in the xyzal which is a medicine to dry up the sinuses. The sinus dripping is causing a lot of the cough.

## 2016-04-15 NOTE — Progress Notes (Signed)
Pre visit review using our clinic review tool, if applicable. No additional management support is needed unless otherwise documented below in the visit note. 

## 2016-04-16 NOTE — Assessment & Plan Note (Signed)
Lungs sound clear. We talked about how allergens are out now and could be causing her problems. She will start using xyzal which we prescribed. Rx for phenergan/DM cough syrup for cough.Talked to her about how the smoking affects how her lungs respond and checking CXR for almost ongoing symptoms for several months. Unless CXR changes no indication for steroids or antibiotics.

## 2016-05-04 ENCOUNTER — Encounter: Payer: Self-pay | Admitting: Internal Medicine

## 2016-05-04 ENCOUNTER — Ambulatory Visit (INDEPENDENT_AMBULATORY_CARE_PROVIDER_SITE_OTHER): Payer: Medicare Other | Admitting: Internal Medicine

## 2016-05-04 DIAGNOSIS — H579 Unspecified disorder of eye and adnexa: Secondary | ICD-10-CM | POA: Insufficient documentation

## 2016-05-04 MED ORDER — OLOPATADINE HCL 0.1 % OP SOLN: 1.0000 [drp] | Freq: Two times a day (BID) | OPHTHALMIC | 0 refills | Status: DC

## 2016-05-04 NOTE — Patient Instructions (Signed)
We have sent in the eye drops to help with the gritty sensation. You can use it twice a day as needed in the eye.   The redness will go away on its own over there next several weeks.

## 2016-05-04 NOTE — Progress Notes (Signed)
Pre visit review using our clinic review tool, if applicable. No additional management support is needed unless otherwise documented below in the visit note. 

## 2016-05-04 NOTE — Progress Notes (Signed)
   Subjective:    Patient ID: Anna Lyons, female    DOB: 24-Feb-1955, 61 y.o.   MRN: [id number]  HPI The patient is a 61 YO female coming in for redness in her eye. Started last Thursday and originally was encompassing the entire eyeball. Not painful. Denies crusting in the mornings. She is having some discomfort in the eye as it feels gritty. She got some pink eye medicine otc as pharmacist advice and started using it. It has helped a little bit. Having some allergies as well lately and was coughing previously with cold symptoms. No fevers or chills. No exposure to pink eye or other sick contact.   Review of Systems  Constitutional: Negative.   Eyes: Positive for photophobia, discharge and redness. Negative for pain, itching and visual disturbance.  Cardiovascular: Negative.   Gastrointestinal: Negative.   Musculoskeletal: Negative.   Neurological: Negative.       Objective:   Physical Exam  Constitutional: She is oriented to person, place, and time. She appears well-developed and well-nourished.  HENT:  Head: Normocephalic.  Eyes: EOM are normal. Pupils are equal, round, and reactive to light.  Large broken blood vessel in the medial eye, conjunctivae clear and no discharge  Neck: Normal range of motion.  Cardiovascular: Normal rate and regular rhythm.   Pulmonary/Chest: Effort normal and breath sounds normal.  Abdominal: Soft.  Neurological: She is alert and oriented to person, place, and time.  Skin: Skin is warm and dry.   Vitals:   05/04/16 1108  BP: 118/60  Pulse: 63  Resp: 12  Temp: 97.6 F (36.4 C)  TempSrc: Oral  SpO2: 98%  Weight: 134 lb (60.8 kg)  Height: 5\' 2"  (1.575 m)      Assessment & Plan:

## 2016-05-04 NOTE — Assessment & Plan Note (Signed)
Reassurance given that this is broken blood vessel and does not need treatment. She is given patanol eye drops for the allergies which she also has on exam for the gritty sensation.

## 2016-05-05 ENCOUNTER — Other Ambulatory Visit: Payer: Self-pay | Admitting: Internal Medicine

## 2016-05-05 NOTE — Telephone Encounter (Signed)
Is patient taking, Please advise

## 2016-05-26 ENCOUNTER — Other Ambulatory Visit: Payer: Self-pay | Admitting: Internal Medicine

## 2016-05-27 NOTE — Telephone Encounter (Signed)
Please advise 

## 2016-06-08 ENCOUNTER — Other Ambulatory Visit: Payer: Self-pay | Admitting: Internal Medicine

## 2016-06-14 ENCOUNTER — Other Ambulatory Visit: Payer: Self-pay | Admitting: Neurology

## 2016-06-14 MED ORDER — LEVETIRACETAM 500 MG PO TABS: 500.0000 mg | ORAL_TABLET | Freq: Two times a day (BID) | ORAL | 0 refills | Status: DC

## 2016-06-16 ENCOUNTER — Other Ambulatory Visit: Payer: Self-pay | Admitting: Internal Medicine

## 2016-06-19 ENCOUNTER — Other Ambulatory Visit: Payer: Self-pay | Admitting: Internal Medicine

## 2016-07-16 ENCOUNTER — Telehealth: Payer: Self-pay | Admitting: Internal Medicine

## 2016-07-16 NOTE — Telephone Encounter (Signed)
Left message on mobile phone for Anna Lyons to call office. Call to patient patient at 15:52pm.  I also called Anna Lyons (patient's daughter, on [place], [phone]) at 16:00pm. Advised her to take Anna Lyons to ED or call 911 if she is having any suicidal ideation. Anna Lyons stated she never said her mother was suicidal. She then proceeded to say Anna Lyons told her  "i am better off dead", but has no intention of hurting herself. Anna Lyons reports that Anna Lyons insisted on going to work today, so she is not with her at the time. She plans on keeping contact with her and has made an appointment for Monday. I advised Anna Lyons again to call 911 or take Anna Lyons to hospital for further evaluation. She stated she will if Anna Lyons's mood worsen over the weekend.

## 2016-07-16 NOTE — Telephone Encounter (Signed)
Patient has made appt to be seen on Monday----would you agree that patient should stop wellbutrin until seen---please advise, I will call daughter back, thanks

## 2016-07-16 NOTE — Telephone Encounter (Signed)
Pt daughter called in and said that the Wellbutrin is not helping and stated that her mother is actually expressing some suicidal thoughts.  Putting her in to see [place]- [phone]

## 2016-07-19 ENCOUNTER — Ambulatory Visit (INDEPENDENT_AMBULATORY_CARE_PROVIDER_SITE_OTHER): Payer: Medicare Other | Admitting: Family

## 2016-07-19 ENCOUNTER — Encounter: Payer: Self-pay | Admitting: Family

## 2016-07-19 VITALS — BP 112/70 | HR 65 | Temp 97.5°F | Resp 16 | Ht 62.0 in | Wt 134.8 lb

## 2016-07-19 DIAGNOSIS — F332 Major depressive disorder, recurrent severe without psychotic features: Secondary | ICD-10-CM | POA: Diagnosis not present

## 2016-07-19 MED ORDER — ESCITALOPRAM OXALATE 10 MG PO TABS: 10.0000 mg | ORAL_TABLET | Freq: Every day | ORAL | 0 refills | Status: DC

## 2016-07-19 NOTE — Patient Instructions (Addendum)
Thank you for choosing [facility].  SUMMARY AND INSTRUCTIONS:  Please decrease the Wellbutrin to 1 tablet daily.  Start the Lexapro daily.   Seek further care if symptoms worsen or thoughts of suicide develop.   They will call to schedule your appointment with a counselor.   [address], [place], [place] [address] [phone]  Medication:  Your prescription(s) have been submitted to your pharmacy or been printed and provided for you. Please take as directed and contact our office if you believe you are having problem(s) with the medication(s) or have any questions.   Follow up:  If your symptoms worsen or fail to improve, please contact our office for further instruction, or in case of emergency go directly to the emergency room at the closest medical facility.

## 2016-07-19 NOTE — Assessment & Plan Note (Signed)
PHQ 9 score indicates severe major depression with no current thoughts of suicide or psychosis. Wean and discontinue Wellbutrin. Start Lexapro. Referral to psychology placed. Resources placed in AVS. Does seem to have good support structure in family. Follow up in 1 month or sooner if needed.

## 2016-07-19 NOTE — Progress Notes (Signed)
Subjective:    Patient ID: Anna Lyons, female    DOB: 1955/02/25, 61 y.o.   MRN: [id number]  Chief Complaint  Patient presents with  . Follow-up    follow up on wellbutrin, trying anti depressants and the wellbutrin is making her feel worse, crying, thoughts of suicide    HPI:  Anna Lyons is a 61 y.o. female who  has a past medical history of Angina; Breast cyst; Cancer ([place]); Depression; Hypertension; RLS (restless legs syndrome) (11/16/2011); Seizures ([place]) (2016); Stroke [facility]) (04/20/2011); TIA (transient ischemic attack) (08/2010); and Tobacco abuse. and presents today for a follow up office visit.   Depression - Previously prescribed Wellbutrin which she reports taking as prescribed and denies adverse side effects. She continues to feel symptoms of depression including increased crying episodes and thoughts of suicide with no plan. She has been on and tried Prozac, Cymbalta, and Zoloft in the past. Not currently attending counseling. No auditory or visual hallucination. Sleeping on average about 4-5 hours per night and not feeling well rested in the morning. She is currently working however, the severity of her symptoms is enough to effect her ability to perform her job. Does have adequate support system and has also been working with her Environmental education officer.  Allergies  Allergen Reactions  . Codeine Itching      Outpatient Medications Prior to Visit  Medication Sig Dispense Refill  . aspirin EC 325 MG tablet Take 325 mg by mouth daily.    [name] atorvastatin (LIPITOR) 80 MG tablet TAKE 1 TABLET BY MOUTH ONCE DAILY 90 tablet 0  . baclofen (LIORESAL) 20 MG tablet TAKE ONE-HALF TABLET BY MOUTH THREE TIMES DAILY FOR  1  WEEK,  THEN  INCREASE  TO  TAKE  1  TAB  THREE  TIMES  DAILY 90 tablet 3  . clonazePAM (KLONOPIN) 1 MG tablet Take 1 tablet (1 mg total) by mouth at bedtime. 30 tablet 1  . diclofenac sodium (VOLTAREN) 1 % GEL Apply 2 g topically 4 (four) times daily. 100 g 3  . ibuprofen  (ADVIL,MOTRIN) 800 MG tablet Take 1 tablet by mouth as needed.  1  . levETIRAcetam (KEPPRA) 500 MG tablet TAKE 1 TABLET BY MOUTH TWICE DAILY  **WILL NEED TO KEEP APPOINTMENT ON 03/29/16 FOR CONTINUED REFILLS** 90 tablet 0  . levETIRAcetam (KEPPRA) 500 MG tablet Take 1 tablet (500 mg total) by mouth 2 (two) times daily. 90 tablet 0  . levocetirizine (XYZAL) 5 MG tablet Take 1 tablet (5 mg total) by mouth every evening. 30 tablet 3  . Multiple Vitamin (MULTIVITAMIN) capsule Take 1 capsule by mouth daily.     [name] olopatadine (PATANOL) 0.1 % ophthalmic solution Place 1 drop into the left eye 2 (two) times daily. 10 mL 0  . ondansetron (ZOFRAN) 4 MG tablet Take 1 tablet (4 mg total) by mouth every 8 (eight) hours as needed for nausea or vomiting. 6 tablet 0  . pramipexole (MIRAPEX) 0.5 MG tablet TAKE ONE TABLET BY MOUTH AT BEDTIME AS NEEDED 30 tablet 1  . promethazine-dextromethorphan (PROMETHAZINE-DM) 6.25-15 MG/5ML syrup Take 5 mLs by mouth at bedtime as needed for cough. 118 mL 0  . ranitidine (ZANTAC) 300 MG capsule Take 1 capsule (300 mg total) by mouth every evening. 30 capsule 5  . traZODone (DESYREL) 100 MG tablet Take 1 tablet by mouth at bedtime.  2  . traZODone (DESYREL) 100 MG tablet TAKE ONE TABLET BY MOUTH AT BEDTIME 30 tablet 3  .  triamterene-hydrochlorothiazide (MAXZIDE) 75-50 MG tablet Take 0.5 tablets by mouth daily. (Patient taking differently: Take 0.5 tablets by mouth as needed. ) 45 tablet 6  . buPROPion (WELLBUTRIN SR) 150 MG 12 hr tablet TAKE ONE TABLET BY MOUTH TWICE DAILY 180 tablet 1  . pramipexole (MIRAPEX) 0.5 MG tablet TAKE 1 TABLET BY MOUTH AT BEDTIME AS NEEDED 30 tablet 1   No facility-administered medications prior to visit.       Past Surgical History:  Procedure Laterality Date  . BREAST RECONSTRUCTION     bilaterally  . CARPAL TUNNEL RELEASE Left 1990's  . CARPAL TUNNEL RELEASE Left 12/08/2015  . CESAREAN SECTION  1979  . KNEE ARTHROSCOPY Left 1990's     cartilage repair  . LEFT HEART CATHETERIZATION WITH CORONARY ANGIOGRAM N/A 05/20/2011   Procedure: LEFT HEART CATHETERIZATION WITH CORONARY ANGIOGRAM;  Surgeon: Josue Hector, MD;  Location[facility] C[facility] LAB;  Service: Cardiovascular;  Laterality: N/A;  . MASTECTOMY Bilateral 1980's   bilateral with reconstruction      Past Medical History:  Diagnosis Date  . Angina   . Breast cyst   . Cancer ([place])   . Depression   . Hypertension   . RLS (restless legs syndrome) 11/16/2011  . Seizures ([place]) 2016   last seizure="last year, 2016" per pt.  . Stroke ([place]) 04/20/2011   a. 04/20/11 Left subcortical infarct treated w/ TPA;  b. 04/2011 Echo: EF 55-60%;  c. 04/2011 Normal Carotid u/s  d. Residual "walk w/limp on right; unable to grasp w/right hand"  . TIA (transient ischemic attack) 08/2010  . Tobacco abuse    a. quit @ time of CVA 04/2011.      Review of Systems  Constitutional: Positive for fatigue. Negative for chills and fever.  Cardiovascular: Negative for chest pain, palpitations and leg swelling.  Psychiatric/Behavioral: Positive for dysphoric mood, sleep disturbance and suicidal ideas. Negative for hallucinations. The patient is nervous/anxious.       Objective:    BP 112/70 (BP Location: Left Arm, Patient Position: Sitting, Cuff Size: Normal)   Pulse 65   Temp 97.5 F (36.4 C) (Oral)   Resp 16   Ht 5\' 2"  (1.575 m)   Wt 134 lb 12.8 oz (61.1 kg)   SpO2 94%   BMI 24.66 kg/m  Nursing note and vital signs reviewed.  Physical Exam  Constitutional: She is oriented to person, place, and time. She appears well-developed and well-nourished. No distress.  Cardiovascular: Normal rate, regular rhythm, normal heart sounds and intact distal pulses.   Pulmonary/Chest: Effort normal and breath sounds normal.  Neurological: She is alert and oriented to person, place, and time.  Skin: Skin is warm and dry.  Psychiatric: Her speech is normal and behavior is normal. Judgment and thought content  normal. Cognition and memory are normal. She exhibits a depressed mood.       Assessment & Plan:   Problem List Items Addressed This Visit      Other   Major depression, recurrent ([place]) - Primary    PHQ 9 score indicates severe major depression with no current thoughts of suicide or psychosis. Wean and discontinue Wellbutrin. Start Lexapro. Referral to psychology placed. Resources placed in AVS. Does seem to have good support structure in family. Follow up in 1 month or sooner if needed.      Relevant Medications   escitalopram (LEXAPRO) 10 MG tablet   Other Relevant Orders   Ambulatory referral to Psychology      I  have discontinued Ms. Capo's buPROPion. I am also having her start on escitalopram. Additionally, I am having her maintain her aspirin EC, diclofenac sodium, multivitamin, ondansetron, clonazePAM, triamterene-hydrochlorothiazide, ranitidine, pramipexole, traZODone, ibuprofen, baclofen, promethazine-dextromethorphan, levocetirizine, olopatadine, traZODone, levETIRAcetam, levETIRAcetam, and atorvastatin.   Meds ordered this encounter  Medications  . escitalopram (LEXAPRO) 10 MG tablet    Sig: Take 1 tablet (10 mg total) by mouth daily.    Dispense:  30 tablet    Refill:  0    Order Specific Question:   Supervising Provider    Answer:   Pricilla Holm A [[id number]]     Follow-up: Return if symptoms worsen or fail to improve.  Mauricio Po, FNP

## 2016-08-10 ENCOUNTER — Other Ambulatory Visit: Payer: Self-pay | Admitting: Family

## 2016-08-10 ENCOUNTER — Other Ambulatory Visit: Payer: Self-pay | Admitting: Internal Medicine

## 2016-08-10 NOTE — Telephone Encounter (Signed)
Please advise 

## 2016-08-18 ENCOUNTER — Encounter: Payer: Self-pay | Admitting: Family

## 2016-08-18 ENCOUNTER — Ambulatory Visit (INDEPENDENT_AMBULATORY_CARE_PROVIDER_SITE_OTHER): Payer: Medicare Other | Admitting: Family

## 2016-08-18 VITALS — BP 110/68 | HR 74 | Temp 97.7°F | Resp 16 | Ht 62.0 in | Wt 136.0 lb

## 2016-08-18 DIAGNOSIS — H579 Unspecified disorder of eye and adnexa: Secondary | ICD-10-CM

## 2016-08-18 DIAGNOSIS — F332 Major depressive disorder, recurrent severe without psychotic features: Secondary | ICD-10-CM

## 2016-08-18 MED ORDER — TEMAZEPAM 15 MG PO CAPS: 15.0000 mg | ORAL_CAPSULE | Freq: Every evening | ORAL | 0 refills | Status: DC | PRN

## 2016-08-18 MED ORDER — AZELASTINE HCL 0.05 % OP SOLN: 1.0000 [drp] | Freq: Two times a day (BID) | OPHTHALMIC | 1 refills | Status: DC

## 2016-08-18 NOTE — Progress Notes (Signed)
Subjective:    Patient ID: Anna Lyons, female    DOB: Apr 23, 1955, 61 y.o.   MRN: [id number]  Chief Complaint  Patient presents with  . Follow-up    medication has made a huge improvement with mood, does mention that she stays tired does not know if it could be meds but also does not sleep well    HPI:  Anna Lyons is a 61 y.o. female who  has a past medical history of Angina; Breast cyst; Cancer ([place]); Depression; Hypertension; RLS (restless legs syndrome) (11/16/2011); Seizures ([place]) (2016); Stroke [facility]) (04/20/2011); TIA (transient ischemic attack) (08/2010); and Tobacco abuse. and presents today for a follow up office visit.  1.) Depression - Recently evaluated in the office and determined to have severe major depression and started on Lexapro. Reports taking the medication as prescribed and denies adverse side effects. Notes significant improvement in her symptoms since starting the medication. Continues to feel tired, however does not sleep well at night. Denies suicidal ideations. Describes difficulty with staying asleep averaging about 5 hours per night and does not feel well rested in the morning. Previously prescribed trazodone which is not really helping.   2.) Eye problem - Previously evaluated in the office for eye redness with concern for pink eye and allergic conjunctivitis and was prescribed Pataday which did not help with her symptoms. Continues to experience the associated symptoms of eye redness with watering. Described as itchy at times.   Allergies  Allergen Reactions  . Codeine Itching      Outpatient Medications Prior to Visit  Medication Sig Dispense Refill  . aspirin EC 325 MG tablet Take 325 mg by mouth daily.    [name] atorvastatin (LIPITOR) 80 MG tablet TAKE 1 TABLET BY MOUTH ONCE DAILY 90 tablet 0  . baclofen (LIORESAL) 20 MG tablet TAKE ONE-HALF TABLET BY MOUTH 3 TIMES DAILY FOR ONE WEEK, THEN INCREASE TO ONE TABLET 3 TIMES DAILY. 90 tablet 2  . diclofenac  sodium (VOLTAREN) 1 % GEL Apply 2 g topically 4 (four) times daily. 100 g 3  . escitalopram (LEXAPRO) 10 MG tablet TAKE 1 TABLET BY MOUTH ONCE DAILY 90 tablet 0  . ibuprofen (ADVIL,MOTRIN) 800 MG tablet Take 1 tablet by mouth as needed.  1  . levETIRAcetam (KEPPRA) 500 MG tablet Take 1 tablet (500 mg total) by mouth 2 (two) times daily. 90 tablet 0  . levocetirizine (XYZAL) 5 MG tablet Take 1 tablet (5 mg total) by mouth every evening. 30 tablet 3  . Multiple Vitamin (MULTIVITAMIN) capsule Take 1 capsule by mouth daily.     [name] olopatadine (PATANOL) 0.1 % ophthalmic solution Place 1 drop into the left eye 2 (two) times daily. 10 mL 0  . ondansetron (ZOFRAN) 4 MG tablet Take 1 tablet (4 mg total) by mouth every 8 (eight) hours as needed for nausea or vomiting. 6 tablet 0  . pramipexole (MIRAPEX) 0.5 MG tablet TAKE ONE TABLET BY MOUTH AT BEDTIME AS NEEDED 30 tablet 1  . promethazine-dextromethorphan (PROMETHAZINE-DM) 6.25-15 MG/5ML syrup Take 5 mLs by mouth at bedtime as needed for cough. 118 mL 0  . ranitidine (ZANTAC) 300 MG capsule Take 1 capsule (300 mg total) by mouth every evening. 30 capsule 5  . triamterene-hydrochlorothiazide (MAXZIDE) 75-50 MG tablet Take 0.5 tablets by mouth daily. (Patient taking differently: Take 0.5 tablets by mouth as needed. ) 45 tablet 6  . clonazePAM (KLONOPIN) 1 MG tablet Take 1 tablet (1 mg total) by mouth at  bedtime. 30 tablet 1  . levETIRAcetam (KEPPRA) 500 MG tablet TAKE 1 TABLET BY MOUTH TWICE DAILY  **WILL NEED TO KEEP APPOINTMENT ON 03/29/16 FOR CONTINUED REFILLS** 90 tablet 0  . traZODone (DESYREL) 100 MG tablet Take 1 tablet by mouth at bedtime.  2  . traZODone (DESYREL) 100 MG tablet TAKE ONE TABLET BY MOUTH AT BEDTIME 30 tablet 3   No facility-administered medications prior to visit.       Past Surgical History:  Procedure Laterality Date  . BREAST RECONSTRUCTION     bilaterally  . CARPAL TUNNEL RELEASE Left 1990's  . CARPAL TUNNEL RELEASE Left  12/08/2015  . CESAREAN SECTION  1979  . KNEE ARTHROSCOPY Left 1990's    cartilage repair  . LEFT HEART CATHETERIZATION WITH CORONARY ANGIOGRAM N/A 05/20/2011   Procedure: LEFT HEART CATHETERIZATION WITH CORONARY ANGIOGRAM;  Surgeon: Josue Hector, MD;  Location: [facility] CATH LAB;  Service: Cardiovascular;  Laterality: N/A;  . MASTECTOMY Bilateral 1980's   bilateral with reconstruction      Past Medical History:  Diagnosis Date  . Angina   . Breast cyst   . Cancer ([place])   . Depression   . Hypertension   . RLS (restless legs syndrome) 11/16/2011  . Seizures ([place]) 2016   last seizure="last year, 2016" per pt.  . Stroke ([place]) 04/20/2011   a. 04/20/11 Left subcortical infarct treated w/ TPA;  b. 04/2011 Echo: EF 55-60%;  c. 04/2011 Normal Carotid u/s  d. Residual "walk w/limp on right; unable to grasp w/right hand"  . TIA (transient ischemic attack) 08/2010  . Tobacco abuse    a. quit @ time of CVA 04/2011.      Review of Systems  Constitutional: Negative for chills and fever.  Eyes: Positive for redness and itching. Negative for pain, discharge and visual disturbance.  Cardiovascular: Negative for chest pain, palpitations and leg swelling.  Psychiatric/Behavioral: Positive for sleep disturbance. Negative for agitation, confusion, decreased concentration, dysphoric mood and suicidal ideas.      Objective:    BP 110/68 (BP Location: Left Arm, Patient Position: Sitting, Cuff Size: Normal)   Pulse 74   Temp 97.7 F (36.5 C) (Oral)   Resp 16   Ht 5\' 2"  (1.575 m)   Wt 136 lb (61.7 kg)   SpO2 94%   BMI 24.87 kg/m  Nursing note and vital signs reviewed.  Physical Exam  Constitutional: She is oriented to person, place, and time. She appears well-developed and well-nourished. No distress.  Eyes: Lids are normal. Lids are everted and swept, no foreign bodies found. Left eye exhibits no chemosis, no discharge, no exudate and no hordeolum. No foreign body present in the left eye. Left  conjunctiva is injected. Left eye exhibits normal extraocular motion and no nystagmus. Left pupil is round and reactive. Pupils are equal.  Cardiovascular: Normal rate, regular rhythm, normal heart sounds and intact distal pulses.   Pulmonary/Chest: Effort normal and breath sounds normal.  Neurological: She is alert and oriented to person, place, and time.  Skin: Skin is warm and dry.  Psychiatric: Her behavior is normal. Judgment and thought content normal. Her mood appears anxious.       Assessment & Plan:   Problem List Items Addressed This Visit      Other   Major depression, recurrent ([place]) - Primary    Depression significantly improved with start of Lexapro with no adverse side effects. No suicidal ideations. Overall reports mood improved with little crying. Does continue  to have issues with sleeping. Discontinue trazodone and start temazepam. Encourage good sleep hygiene. Continue current dosage of Lexapro. Follow-up in 3 months or sooner if needed.      Eye problem    Symptoms and exam consistent with allergic conjunctivitis. Discontinue Pataday and start Optivar. If symptoms worsen or do not improve consider referral to ophthalmology.          I have discontinued Ms. Harriman's clonazePAM, traZODone, and traZODone. I am also having her start on temazepam and azelastine. Additionally, I am having her maintain her aspirin EC, diclofenac sodium, multivitamin, ondansetron, triamterene-hydrochlorothiazide, ranitidine, pramipexole, ibuprofen, promethazine-dextromethorphan, levocetirizine, olopatadine, levETIRAcetam, atorvastatin, baclofen, and escitalopram.   Meds ordered this encounter  Medications  . temazepam (RESTORIL) 15 MG capsule    Sig: Take 1 capsule (15 mg total) by mouth at bedtime as needed for sleep.    Dispense:  30 capsule    Refill:  0    Order Specific Question:   Supervising Provider    Answer:   Pricilla Holm A [[id number]]  . azelastine (OPTIVAR) 0.05 %  ophthalmic solution    Sig: Place 1 drop into both eyes 2 (two) times daily.    Dispense:  6 mL    Refill:  1    Order Specific Question:   Supervising Provider    Answer:   Pricilla Holm A [[id number]]     Follow-up: Return in about 3 months (around 11/18/2016), or if symptoms worsen or fail to improve.  Mauricio Po, FNP

## 2016-08-18 NOTE — Assessment & Plan Note (Signed)
Depression significantly improved with start of Lexapro with no adverse side effects. No suicidal ideations. Overall reports mood improved with little crying. Does continue to have issues with sleeping. Discontinue trazodone and start temazepam. Encourage good sleep hygiene. Continue current dosage of Lexapro. Follow-up in 3 months or sooner if needed.

## 2016-08-18 NOTE — Assessment & Plan Note (Signed)
Symptoms and exam consistent with allergic conjunctivitis. Discontinue Pataday and start Optivar. If symptoms worsen or do not improve consider referral to ophthalmology.

## 2016-08-18 NOTE — Patient Instructions (Signed)
Thank you for choosing [facility].  SUMMARY AND INSTRUCTIONS:  Please continue to take your medications.   Start the eye drops twice daily.  Start the [place] as needed for sleep.  STOP the trazodone.   Medication:  Your prescription(s) have been submitted to your pharmacy or been printed and provided for you. Please take as directed and contact our office if you believe you are having problem(s) with the medication(s) or have any questions.  Follow up:  If your symptoms worsen or fail to improve, please contact our office for further instruction, or in case of emergency go directly to the emergency room at the closest medical facility.

## 2016-08-27 ENCOUNTER — Other Ambulatory Visit: Payer: Self-pay | Admitting: Internal Medicine

## 2016-08-31 ENCOUNTER — Other Ambulatory Visit: Payer: Self-pay | Admitting: Internal Medicine

## 2016-08-31 NOTE — Progress Notes (Signed)
Pre visit review using our clinic review tool, if applicable. No additional management support is needed unless otherwise documented below in the visit note. 

## 2016-08-31 NOTE — Progress Notes (Addendum)
Subjective:   Anna Lyons is a 61 y.o. female who presents for Medicare Annual (Subsequent) preventive examination.  Patient c/o left eye irritation. Patient requests Pramipexole medication refill  Review of Systems:  No ROS.  Medicare Wellness Visit. Additional risk factors are reflected in the social history.  Cardiac Risk Factors include: advanced age (>59men, >36 women) Sleep patterns: gets up 1 times nightly to void and sleeps 6-7 hours nightly.    Home Safety/Smoke Alarms: Feels safe in home. Smoke alarms in place.  Living environment; residence and Firearm Safety: apartment, no firearms. Lives alone, no needs for DME, good support system Seat Belt Safety/Bike Helmet: Wears seat belt.   Counseling:   Eye Exam- scheduled provider appointment 09/03/16 for c/o ongoing eye irritatio,  patient wants to discuss appropriate referral as she does not have ophthalmologist. Dental- detures  Female:   Pap- Last 07/17/15      Mammo- Breast US Left and Right 12/17/15, BI-RADS category 2: benign        Dexa scan- N/D     CCS- Last 12/18/15, polyps, recall 5 years     Objective:     Vitals: BP 122/64   Pulse 64   Resp 20   Ht 5\' 2"  (1.575 m)   Wt 138 lb (62.6 kg)   SpO2 98%   BMI 25.24 kg/m   Body mass index is 25.24 kg/m.   Tobacco History  Smoking Status  . Current Every Day Smoker  . Packs/day: 0.50  . Years: 37.00  . Types: Cigarettes  Smokeless Tobacco  . Never Used     Ready to quit: Not Answered Counseling given: Not Answered   Past Medical History:  Diagnosis Date  . Angina   . Breast cyst   . Cancer ([place])   . Depression   . Hypertension   . RLS (restless legs syndrome) 11/16/2011  . Seizures ([place]) 2016   last seizure="last year, 2016" per pt.  . Stroke ([place]) 04/20/2011   a. 04/20/11 Left subcortical infarct treated w/ TPA;  b. 04/2011 Echo: EF 55-60%;  c. 04/2011 Normal Carotid u/s  d. Residual "walk w/limp on right; unable to grasp w/right hand"  . TIA  (transient ischemic attack) 08/2010  . Tobacco abuse    a. quit @ time of CVA 04/2011.   Past Surgical History:  Procedure Laterality Date  . BREAST RECONSTRUCTION     bilaterally  . CARPAL TUNNEL RELEASE Left 1990's  . CARPAL TUNNEL RELEASE Left 12/08/2015  . CESAREAN SECTION  1979  . KNEE ARTHROSCOPY Left 1990's    cartilage repair  . LEFT HEART CATHETERIZATION WITH CORONARY ANGIOGRAM N/A 05/20/2011   Procedure: LEFT HEART CATHETERIZATION WITH CORONARY ANGIOGRAM;  Surgeon: Josue Hector, MD;  Location: [facility] CATH LAB;  Service: Cardiovascular;  Laterality: N/A;  . MASTECTOMY Bilateral 1980's   bilateral with reconstruction   Family History  Problem Relation Age of Onset  . Lupus Mother        died early 106's.  . Emphysema Father        died late 36's.  [name] COPD Father   . Pancreatic cancer Brother   . Bladder Cancer Brother   . Rectal cancer Brother   . Colon cancer Neg Hx    History  Sexual Activity  . Sexual activity: No    Outpatient Encounter Prescriptions as of 09/01/2016  Medication Sig  . aspirin EC 325 MG tablet Take 325 mg by mouth daily.  [name] atorvastatin (LIPITOR)  80 MG tablet TAKE 1 TABLET BY MOUTH ONCE DAILY  . azelastine (OPTIVAR) 0.05 % ophthalmic solution Place 1 drop into both eyes 2 (two) times daily.  . baclofen (LIORESAL) 20 MG tablet TAKE ONE-HALF TABLET BY MOUTH 3 TIMES DAILY FOR ONE WEEK, THEN INCREASE TO ONE TABLET 3 TIMES DAILY.  [name] diclofenac sodium (VOLTAREN) 1 % GEL Apply 2 g topically 4 (four) times daily.  [name] escitalopram (LEXAPRO) 10 MG tablet TAKE 1 TABLET BY MOUTH ONCE DAILY  . ibuprofen (ADVIL,MOTRIN) 800 MG tablet Take 1 tablet by mouth as needed.  . levETIRAcetam (KEPPRA) 500 MG tablet Take 1 tablet (500 mg total) by mouth 2 (two) times daily.  [name] levocetirizine (XYZAL) 5 MG tablet Take 1 tablet (5 mg total) by mouth every evening.  . Multiple Vitamin (MULTIVITAMIN) capsule Take 1 capsule by mouth daily.   [name] olopatadine (PATANOL) 0.1 % ophthalmic  solution Place 1 drop into the left eye 2 (two) times daily.  . pramipexole (MIRAPEX) 0.5 MG tablet TAKE ONE TABLET BY MOUTH AT BEDTIME AS NEEDED  . ranitidine (ZANTAC) 300 MG capsule Take 1 capsule (300 mg total) by mouth every evening.  . temazepam (RESTORIL) 15 MG capsule Take 1 capsule (15 mg total) by mouth at bedtime as needed for sleep.  [name] triamterene-hydrochlorothiazide (MAXZIDE) 75-50 MG tablet Take 0.5 tablets by mouth daily. (Patient taking differently: Take 0.5 tablets by mouth as needed. )  . [DISCONTINUED] ondansetron (ZOFRAN) 4 MG tablet Take 1 tablet (4 mg total) by mouth every 8 (eight) hours as needed for nausea or vomiting. (Patient not taking: Reported on 09/01/2016)  . [DISCONTINUED] promethazine-dextromethorphan (PROMETHAZINE-DM) 6.25-15 MG/5ML syrup Take 5 mLs by mouth at bedtime as needed for cough. (Patient not taking: Reported on 09/01/2016)   No facility-administered encounter medications on file as of 09/01/2016.     Activities of Daily Living In your present state of health, do you have any difficulty performing the following activities: 09/01/2016  Hearing? N  Vision? N  Difficulty concentrating or making decisions? N  Walking or climbing stairs? N  Dressing or bathing? N  Doing errands, shopping? N  Preparing Food and eating ? N  Using the Toilet? N  In the past six months, have you accidently leaked urine? N  Do you have problems with loss of bowel control? N  Managing your Medications? N  Managing your Finances? N  Housekeeping or managing your Housekeeping? N  Some recent data might be hidden    Patient Care Team: Hoyt Koch, MD as PCP - General (Internal Medicine) Letta Pate Luanna Salk, MD as Consulting Physician (Physical Medicine and Rehabilitation)    Assessment:    Physical assessment deferred to PCP.  Exercise Activities and Dietary recommendations Current Exercise Habits: Home exercise routine, Type of exercise: walking, Time (Minutes):  40, Frequency (Times/Week): 7, Weekly Exercise (Minutes/Week): 280, Intensity: Mild  Diet (meal preparation, eat out, water intake, caffeinated beverages, dairy products, fruits and vegetables): in general, an "unhealthy" diet, reports decreased appetite, drinks sweet tea throughout the day, 1 -2 glasses of water.  Discussed supplementation with ensure or boost (sample and coupons provided), encouraged patient to increase daily water intake. Educated to try to eat small frequent meals and have a wide variety of snacks to select from throughout the day.     Goals    . Attending meditation / other stress management classes          Plan  1. You will go talk to a  counselor regarding grief; loss and lonliness 2. Nurse will fup on resources; for household; food; [employer] silver sneakers; Senior center or social resources    . Be active and involved with my family and grandson. 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 Yes No No Yes  Number falls in past yr: 2 or more - - 1  Injury with Fall? Yes - - No  Risk for fall due to : - - - Impaired mobility  Follow up Falls prevention discussed;Education provided - - Education provided   Depression Screen PHQ 2/9 Scores 09/01/2016 07/19/2016 05/24/2014  PHQ - 2 Score 2 5 5   PHQ- 9 Score 8 22 14   Exception Documentation - - Patient refusal     Cognitive Function MMSE - Mini Mental State Exam 09/01/2016 05/24/2014  Orientation to time 5 5  Orientation to Place 5 5  Registration 3 3  Attention/ Calculation 5 5  Recall 3 3  Language- name 2 objects 2 2  Language- repeat 1 1  Language- follow 3 step command 3 3  Language- read & follow direction 1 1  Write a sentence 1 1  Copy design 1 1  Total score 30 30        Immunization History  Administered Date(s) Administered  . Influenza Split 11/17/2011  . Influenza Whole 11/01/2013  . Influenza,inj,Quad  PF,36+ Mos 09/23/2015  . Influenza-Unspecified 11/12/2014  . PPD Test 05/15/2014  . Pneumococcal Polysaccharide-23 11/17/2011  . Tdap 12/20/2013  . Zoster 12/13/2013   Screening Tests Health Maintenance  Topic Date Due  . Hepatitis C Screening  06-06-1955  . HIV Screening  02/01/1969  . MAMMOGRAM  11/15/2011  . INFLUENZA VACCINE  09/01/2016  . PAP SMEAR  07/17/2018  . TETANUS/TDAP  12/21/2023  . COLONOSCOPY  12/17/2025      Plan:    Scheduled appointment  09/03/16 to evaluate left eye irritation. Received verbal order by provider to refill pramipexole.  Continue doing brain stimulating activities (puzzles, reading, adult coloring books, staying active) to keep memory sharp.   Continue to eat heart healthy diet (full of fruits, vegetables, whole grains, lean protein, water--limit salt, fat, and sugar intake) and increase physical activity as tolerated.   I have personally reviewed and noted the following in the patient's chart:   . Medical and social history . Use of alcohol, tobacco or illicit drugs  . Current medications and supplements . Functional ability and status . Nutritional status . Physical activity . Advanced directives . List of other physicians . Vitals . Screenings to include cognitive, depression, and falls . Referrals and appointments  In addition, I have reviewed and discussed with patient certain preventive protocols, quality metrics, and best practice recommendations. A written personalized care plan for preventive services as well as general preventive health recommendations were provided to patient.     Michiel Cowboy, RN  09/01/2016   Medical screening examination/treatment/procedure(s) were performed by non-physician practitioner and as supervising physician I was immediately available for consultation/collaboration. I agree with above. Binnie Rail, MD

## 2016-09-01 ENCOUNTER — Ambulatory Visit (INDEPENDENT_AMBULATORY_CARE_PROVIDER_SITE_OTHER): Payer: Medicare Other | Admitting: *Deleted

## 2016-09-01 VITALS — BP 122/64 | HR 64 | Resp 20 | Ht 62.0 in | Wt 138.0 lb

## 2016-09-01 DIAGNOSIS — Z0000 Encounter for general adult medical examination without abnormal findings: Secondary | ICD-10-CM | POA: Diagnosis not present

## 2016-09-01 MED ORDER — PRAMIPEXOLE DIHYDROCHLORIDE 0.5 MG PO TABS: 0.5000 mg | ORAL_TABLET | Freq: Every evening | ORAL | 1 refills | Status: DC | PRN

## 2016-09-01 NOTE — Patient Instructions (Signed)
Continue doing brain stimulating activities (puzzles, reading, adult coloring books, staying active) to keep memory sharp.   Continue to eat heart healthy diet (full of fruits, vegetables, whole grains, lean protein, water--limit salt, fat, and sugar intake) and increase physical activity as tolerated.   Anna Lyons , Thank you for taking time to come for your Medicare Wellness Visit. I appreciate your ongoing commitment to your health goals. Please review the following plan we discussed and let me know if I can assist you in the future.   These are the goals we discussed: Goals    . Attending meditation / other stress management classes          Plan  1. You will go talk to a counselor regarding grief; loss and lonliness 2. Nurse will fup on resources; for household; food; [employer] silver sneakers; Senior center or social resources    . Be active and involved with my family and grandson. 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  Hepatitis C: One time screening is recommended by Center for Disease Control  (CDC) for  adults born from [birth year] through 1966.   09/06/1955  . HIV Screening  02/01/1969  . Mammogram  11/15/2011  . Flu Shot  09/01/2016  . Pap Smear  07/17/2018  . Tetanus Vaccine  12/21/2023  . Colon Cancer Screening  12/17/2025    Stress and Stress Management Stress is a normal reaction to life events. It is what you feel when life demands more than you are used to or more than you can handle. Some stress can be useful. For example, the stress reaction can help you catch the last bus of the day, study for a test, or meet a deadline at work. But stress that occurs too often or for too long can cause problems. It can affect your emotional health and interfere with relationships and normal daily activities. Too much stress can weaken your immune system and increase your risk  for physical illness. If you already have a medical problem, stress can make it worse. What are the causes? All sorts of life events may cause stress. An event that causes stress for one person may not be stressful for another person. Major life events commonly cause stress. These may be positive or negative. Examples include losing your job, moving into a new home, getting married, having a baby, or losing a loved one. Less obvious life events may also cause stress, especially if they occur day after day or in combination. Examples include working long hours, driving in traffic, caring for children, being in debt, or being in a difficult relationship. What are the signs or symptoms? Stress may cause emotional symptoms including, the following:  Anxiety. This is feeling worried, afraid, on edge, overwhelmed, or out of control.  Anger. This is feeling irritated or impatient.  Depression. This is feeling sad, down, helpless, or guilty.  Difficulty focusing, remembering, or making decisions.  Stress may cause physical symptoms, including the following:  Aches and pains. These may affect your head, neck, back, stomach, or other areas of your body.  Tight muscles or clenched jaw.  Low energy or trouble sleeping.  Stress may cause unhealthy behaviors, including the following:  Eating to feel better (overeating) or skipping meals.  Sleeping too little, too much, or both.  Working too much  or putting off tasks (procrastination).  Smoking, drinking alcohol, or using drugs to feel better.  How is this diagnosed? Stress is diagnosed through an assessment by your health care provider. Your health care provider will ask questions about your symptoms and any stressful life events.Your health care provider will also ask about your medical history and may order blood tests or other tests. Certain medical conditions and medicine can cause physical symptoms similar to stress. Mental illness can cause  emotional symptoms and unhealthy behaviors similar to stress. Your health care provider may refer you to a mental health professional for further evaluation. How is this treated? Stress management is the recommended treatment for stress.The goals of stress management are reducing stressful life events and coping with stress in healthy ways. Techniques for reducing stressful life events include the following:  Stress identification. Self-monitor for stress and identify what causes stress for you. These skills may help you to avoid some stressful events.  Time management. Set your priorities, keep a calendar of events, and learn to say "no." These tools can help you avoid making too many commitments.  Techniques for coping with stress include the following:  Rethinking the problem. Try to think realistically about stressful events rather than ignoring them or overreacting. Try to find the positives in a stressful situation rather than focusing on the negatives.  Exercise. Physical exercise can release both physical and emotional tension. The key is to find a form of exercise you enjoy and do it regularly.  Relaxation techniques. These relax the body and mind. Examples include yoga, meditation, tai chi, biofeedback, deep breathing, progressive muscle relaxation, listening to music, being out in nature, journaling, and other hobbies. Again, the key is to find one or more that you enjoy and can do regularly.  Healthy lifestyle. Eat a balanced diet, get plenty of sleep, and do not smoke. Avoid using alcohol or drugs to relax.  Strong support network. Spend time with family, friends, or other people you enjoy being around.Express your feelings and talk things over with someone you trust.  Counseling or talktherapy with a mental health professional may be helpful if you are having difficulty managing stress on your own. Medicine is typically not recommended for the treatment of stress.Talk to your  health care provider if you think you need medicine for symptoms of stress. Follow these instructions at home:  Keep all follow-up visits as directed by your health care provider.  Take all medicines as directed by your health care provider. Contact a health care provider if:  Your symptoms get worse or you start having new symptoms.  You feel overwhelmed by your problems and can no longer manage them on your own. Get help right away if:  You feel like hurting yourself or someone else. This information is not intended to replace advice given to you by your health care provider. Make sure you discuss any questions you have with your health care provider. Document Released: 07/14/2000 Document Revised: 06/26/2015 Document Reviewed: 09/12/2012 Elsevier Interactive Patient Education  2017 [employer].

## 2016-09-02 ENCOUNTER — Ambulatory Visit (INDEPENDENT_AMBULATORY_CARE_PROVIDER_SITE_OTHER): Payer: No Typology Code available for payment source | Admitting: Licensed Clinical Social Worker

## 2016-09-02 ENCOUNTER — Ambulatory Visit: Payer: Self-pay | Admitting: Family

## 2016-09-02 DIAGNOSIS — F321 Major depressive disorder, single episode, moderate: Secondary | ICD-10-CM

## 2016-09-03 ENCOUNTER — Ambulatory Visit (INDEPENDENT_AMBULATORY_CARE_PROVIDER_SITE_OTHER): Payer: Medicare Other | Admitting: Family

## 2016-09-03 ENCOUNTER — Encounter: Payer: Self-pay | Admitting: Family

## 2016-09-03 VITALS — BP 124/70 | HR 62 | Temp 97.6°F | Resp 12 | Ht 62.0 in | Wt 140.0 lb

## 2016-09-03 DIAGNOSIS — F332 Major depressive disorder, recurrent severe without psychotic features: Secondary | ICD-10-CM | POA: Diagnosis not present

## 2016-09-03 DIAGNOSIS — H579 Unspecified disorder of eye and adnexa: Secondary | ICD-10-CM | POA: Diagnosis not present

## 2016-09-03 MED ORDER — ESCITALOPRAM OXALATE 20 MG PO TABS: 20.0000 mg | ORAL_TABLET | Freq: Every day | ORAL | 1 refills | Status: DC

## 2016-09-03 NOTE — Assessment & Plan Note (Signed)
Symptoms remain labile and recently evaluated by psychology/counseling with the recommendation to increase Lexapro. Denies suicidal ideations. No psychotic features. Increase Lexapro.

## 2016-09-03 NOTE — Patient Instructions (Signed)
Thank you for choosing [facility].  SUMMARY AND INSTRUCTIONS:  Please increase your Lexapro to 20 mg daily (2 tablets per day of the 10 mg and then 1 tablet when you get a refill).  You will receive a call to schedule an appointment with opthalmology.   Medication:  Your prescription(s) have been submitted to your pharmacy or been printed and provided for you. Please take as directed and contact our office if you believe you are having problem(s) with the medication(s) or have any questions.  Follow up:  If your symptoms worsen or fail to improve, please contact our office for further instruction, or in case of emergency go directly to the emergency room at the closest medical facility.

## 2016-09-03 NOTE — Assessment & Plan Note (Signed)
Continues to experience eye redness with no discharge and a gritty feeling that has been refractory to allergy drops. Concern for chronic eye dryness or possible abrasion. Refer to ophthalmology for further assessment and treatment.

## 2016-09-03 NOTE — Progress Notes (Signed)
Subjective:    Patient ID: Anna Lyons, female    DOB: 1955-07-11, 61 y.o.   MRN: [id number]  Chief Complaint  Patient presents with  . Acute Visit    left eye irritation not better    HPI:  Anna Lyons is a 61 y.o. female who  has a past medical history of Angina; Breast cyst; Cancer ([place]); Depression; Hypertension; RLS (restless legs syndrome) (11/16/2011); Seizures ([place]) (2016); Stroke [facility]) (04/20/2011); TIA (transient ischemic attack) (08/2010); and Tobacco abuse. and presents today for a follow up office visit.    1.) Eye Redness - Continues to experience the associated symptom of redness, itchy and blurred vision that has been going on for about 4 months and refractory to allergy medications located in her left eye. Continues to experience a gritty sensation. No discharge. Course of the symptoms is staying about the same.   2.) Depression - Currently maintained on Lexparo. Reports taking the medication as prescribed and denies adverse side effects.  Has been seen by psychology with the recommendation of increasing her medication secondary to increased symptoms. No suicidal ideations or hallucinations.    Allergies  Allergen Reactions  . Codeine Itching      Outpatient Medications Prior to Visit  Medication Sig Dispense Refill  . aspirin EC 325 MG tablet Take 325 mg by mouth daily.    [name] atorvastatin (LIPITOR) 80 MG tablet TAKE 1 TABLET BY MOUTH ONCE DAILY 90 tablet 0  . azelastine (OPTIVAR) 0.05 % ophthalmic solution Place 1 drop into both eyes 2 (two) times daily. 6 mL 1  . baclofen (LIORESAL) 20 MG tablet TAKE ONE-HALF TABLET BY MOUTH 3 TIMES DAILY FOR ONE WEEK, THEN INCREASE TO ONE TABLET 3 TIMES DAILY. 90 tablet 2  . diclofenac sodium (VOLTAREN) 1 % GEL Apply 2 g topically 4 (four) times daily. 100 g 3  . ibuprofen (ADVIL,MOTRIN) 800 MG tablet Take 1 tablet by mouth as needed.  1  . levETIRAcetam (KEPPRA) 500 MG tablet Take 1 tablet (500 mg total) by mouth 2 (two) times  daily. 90 tablet 0  . levocetirizine (XYZAL) 5 MG tablet Take 1 tablet (5 mg total) by mouth every evening. 30 tablet 3  . Multiple Vitamin (MULTIVITAMIN) capsule Take 1 capsule by mouth daily.     [name] olopatadine (PATANOL) 0.1 % ophthalmic solution Place 1 drop into the left eye 2 (two) times daily. 10 mL 0  . pramipexole (MIRAPEX) 0.5 MG tablet Take 1 tablet (0.5 mg total) by mouth at bedtime as needed. 30 tablet 1  . ranitidine (ZANTAC) 300 MG capsule Take 1 capsule (300 mg total) by mouth every evening. 30 capsule 5  . temazepam (RESTORIL) 15 MG capsule Take 1 capsule (15 mg total) by mouth at bedtime as needed for sleep. 30 capsule 0  . triamterene-hydrochlorothiazide (MAXZIDE) 75-50 MG tablet Take 0.5 tablets by mouth daily. (Patient taking differently: Take 0.5 tablets by mouth as needed. ) 45 tablet 6  . escitalopram (LEXAPRO) 10 MG tablet TAKE 1 TABLET BY MOUTH ONCE DAILY 90 tablet 0   No facility-administered medications prior to visit.      Review of Systems  Constitutional: Negative for chills and fever.  Eyes: Positive for redness and visual disturbance (blurred vision). Negative for discharge and itching.  Respiratory: Negative for chest tightness, shortness of breath and wheezing.   Psychiatric/Behavioral: Positive for dysphoric mood. Negative for agitation, behavioral problems, confusion, self-injury, sleep disturbance and suicidal ideas. The patient is not nervous/anxious.  Objective:    BP 124/70 (BP Location: Left Arm, Patient Position: Sitting, Cuff Size: Normal)   Pulse 62   Temp 97.6 F (36.4 C) (Oral)   Resp 12   Ht 5\' 2"  (1.575 m)   Wt 140 lb (63.5 kg)   SpO2 98%   BMI 25.61 kg/m  Nursing note and vital signs reviewed.  Physical Exam  Constitutional: She is oriented to person, place, and time. She appears well-developed and well-nourished. No distress.  Eyes: Lids are normal. Lids are everted and swept, no foreign bodies found.  Left eye redness with  no discharge.   Cardiovascular: Normal rate, regular rhythm, normal heart sounds and intact distal pulses.   Pulmonary/Chest: Effort normal and breath sounds normal.  Neurological: She is alert and oriented to person, place, and time.  Skin: Skin is warm and dry.  Psychiatric: Her behavior is normal. Judgment and thought content normal. She exhibits a depressed mood.       Assessment & Plan:   Problem List Items Addressed This Visit      Other   Major depression, recurrent ([place])    Symptoms remain labile and recently evaluated by psychology/counseling with the recommendation to increase Lexapro. Denies suicidal ideations. No psychotic features. Increase Lexapro.      Relevant Medications   escitalopram (LEXAPRO) 20 MG tablet   Eye problem - Primary    Continues to experience eye redness with no discharge and a gritty feeling that has been refractory to allergy drops. Concern for chronic eye dryness or possible abrasion. Refer to ophthalmology for further assessment and treatment.      Relevant Orders   Ambulatory referral to Ophthalmology       I have changed Anna Lyons's escitalopram. I am also having her maintain her aspirin EC, diclofenac sodium, multivitamin, triamterene-hydrochlorothiazide, ranitidine, ibuprofen, levocetirizine, olopatadine, levETIRAcetam, atorvastatin, baclofen, temazepam, azelastine, and pramipexole.   Meds ordered this encounter  Medications  . escitalopram (LEXAPRO) 20 MG tablet    Sig: Take 1 tablet (20 mg total) by mouth daily.    Dispense:  30 tablet    Refill:  1    Please fill at next patient refill request.    Order Specific Question:   Supervising Provider    Answer:   Pricilla Holm A [[id number]]     Follow-up: Return in about 1 month (around 10/04/2016), or if symptoms worsen or fail to improve.  Mauricio Po, FNP

## 2016-09-12 ENCOUNTER — Other Ambulatory Visit: Payer: Self-pay | Admitting: Family

## 2016-09-17 ENCOUNTER — Ambulatory Visit (INDEPENDENT_AMBULATORY_CARE_PROVIDER_SITE_OTHER): Payer: No Typology Code available for payment source | Admitting: Licensed Clinical Social Worker

## 2016-09-17 DIAGNOSIS — F321 Major depressive disorder, single episode, moderate: Secondary | ICD-10-CM

## 2016-09-23 ENCOUNTER — Other Ambulatory Visit: Payer: Self-pay | Admitting: Neurology

## 2016-09-23 ENCOUNTER — Ambulatory Visit (INDEPENDENT_AMBULATORY_CARE_PROVIDER_SITE_OTHER): Payer: No Typology Code available for payment source | Admitting: Licensed Clinical Social Worker

## 2016-09-23 ENCOUNTER — Other Ambulatory Visit: Payer: Self-pay | Admitting: Internal Medicine

## 2016-09-23 DIAGNOSIS — F324 Major depressive disorder, single episode, in partial remission: Secondary | ICD-10-CM | POA: Diagnosis not present

## 2016-09-25 ENCOUNTER — Other Ambulatory Visit: Payer: Self-pay | Admitting: Internal Medicine

## 2016-09-30 ENCOUNTER — Ambulatory Visit (INDEPENDENT_AMBULATORY_CARE_PROVIDER_SITE_OTHER): Payer: No Typology Code available for payment source | Admitting: Licensed Clinical Social Worker

## 2016-09-30 DIAGNOSIS — F324 Major depressive disorder, single episode, in partial remission: Secondary | ICD-10-CM | POA: Diagnosis not present

## 2016-10-06 ENCOUNTER — Other Ambulatory Visit: Payer: Self-pay | Admitting: Internal Medicine

## 2016-10-06 ENCOUNTER — Other Ambulatory Visit: Payer: Self-pay | Admitting: Family

## 2016-10-12 ENCOUNTER — Ambulatory Visit (INDEPENDENT_AMBULATORY_CARE_PROVIDER_SITE_OTHER): Payer: No Typology Code available for payment source | Admitting: Licensed Clinical Social Worker

## 2016-10-12 DIAGNOSIS — F324 Major depressive disorder, single episode, in partial remission: Secondary | ICD-10-CM | POA: Diagnosis not present

## 2016-10-18 ENCOUNTER — Ambulatory Visit: Payer: No Typology Code available for payment source | Admitting: Clinical

## 2016-10-19 ENCOUNTER — Other Ambulatory Visit: Payer: Self-pay | Admitting: Internal Medicine

## 2016-10-19 ENCOUNTER — Other Ambulatory Visit: Payer: Self-pay | Admitting: Family

## 2016-10-20 NOTE — Telephone Encounter (Signed)
Faxed to [facility] on [address]

## 2016-10-26 ENCOUNTER — Ambulatory Visit (INDEPENDENT_AMBULATORY_CARE_PROVIDER_SITE_OTHER): Payer: No Typology Code available for payment source | Admitting: Licensed Clinical Social Worker

## 2016-10-26 DIAGNOSIS — F324 Major depressive disorder, single episode, in partial remission: Secondary | ICD-10-CM | POA: Diagnosis not present

## 2016-10-27 ENCOUNTER — Other Ambulatory Visit: Payer: Self-pay | Admitting: Family

## 2016-10-27 ENCOUNTER — Other Ambulatory Visit: Payer: Self-pay

## 2016-10-27 MED ORDER — ESCITALOPRAM OXALATE 20 MG PO TABS: 20.0000 mg | ORAL_TABLET | Freq: Every day | ORAL | 1 refills | Status: DC

## 2016-10-28 ENCOUNTER — Encounter: Payer: Self-pay | Admitting: Family

## 2016-10-28 ENCOUNTER — Telehealth: Payer: Self-pay | Admitting: Internal Medicine

## 2016-10-28 ENCOUNTER — Ambulatory Visit (INDEPENDENT_AMBULATORY_CARE_PROVIDER_SITE_OTHER): Payer: Medicare Other | Admitting: Family

## 2016-10-28 VITALS — BP 108/60 | HR 66 | Temp 97.8°F | Resp 16 | Ht 62.0 in | Wt 138.0 lb

## 2016-10-28 DIAGNOSIS — Z23 Encounter for immunization: Secondary | ICD-10-CM

## 2016-10-28 DIAGNOSIS — F332 Major depressive disorder, recurrent severe without psychotic features: Secondary | ICD-10-CM | POA: Diagnosis not present

## 2016-10-28 MED ORDER — ESCITALOPRAM OXALATE 20 MG PO TABS: 20.0000 mg | ORAL_TABLET | Freq: Every day | ORAL | 0 refills | Status: DC

## 2016-10-28 MED ORDER — PRAMIPEXOLE DIHYDROCHLORIDE 0.5 MG PO TABS: 0.5000 mg | ORAL_TABLET | Freq: Every evening | ORAL | 1 refills | Status: DC | PRN

## 2016-10-28 NOTE — Telephone Encounter (Signed)
Notified pharmacy spoke w/[name] gave [name] response...[name]

## 2016-10-28 NOTE — Patient Instructions (Signed)
Thank you for choosing [facility].  SUMMARY AND INSTRUCTIONS:  Please continue to take your medications as prescribed.  Continue 20 mg of Lexapro daily which is 1 tablet.  Ask to pay cash price for the Lexapro and present the coupon and prescription..  Medication:  Your prescription(s) have been submitted to your pharmacy or been printed and provided for you. Please take as directed and contact our office if you believe you are having problem(s) with the medication(s) or have any questions.  Follow up:  If your symptoms worsen or fail to improve, please contact our office for further instruction, or in case of emergency go directly to the emergency room at the closest medical facility.

## 2016-10-28 NOTE — Telephone Encounter (Signed)
Pharmacy is requesting ok to dispense lexapro early to patient?

## 2016-10-28 NOTE — Telephone Encounter (Signed)
Pt notified we spoke to pharmacy

## 2016-10-28 NOTE — Assessment & Plan Note (Signed)
Discussed with patient proper medication administration taking 20 mg daily. Continue current dosage of Lexapro. No suicidal ideations or signs of psychosis. Follow-up in 3 months or sooner if needed.

## 2016-10-28 NOTE — Telephone Encounter (Signed)
Ok to dispense with 20 mg of Lexapro daily.

## 2016-10-28 NOTE — Progress Notes (Signed)
Subjective:    Patient ID: Anna Lyons, female    DOB: 04-10-1955, 61 y.o.   MRN: [id number]  Chief Complaint  Patient presents with  . Medication Refill    needs lexapro increased to 40 mg,     HPI:  Anna Lyons is a 61 y.o. female who  has a past medical history of Angina; Breast cyst; Cancer ([place]); Depression; Hypertension; RLS (restless legs syndrome) (11/16/2011); Seizures ([place]) (2016); Stroke [facility]) (04/20/2011); TIA (transient ischemic attack) (08/2010); and Tobacco abuse. and presents today for a follow up office visit.   Depression -  Currently prescribed Lexapro. Reports taking 40 mg daily as that is what she was instructed to do. No adverse side effects or suicidal ideations. Mood has been stable.   Allergies  Allergen Reactions  . Codeine Itching      Outpatient Medications Prior to Visit  Medication Sig Dispense Refill  . aspirin EC 325 MG tablet Take 325 mg by mouth daily.    [name] atorvastatin (LIPITOR) 80 MG tablet TAKE 1 TABLET BY MOUTH ONCE DAILY 90 tablet 0  . azelastine (OPTIVAR) 0.05 % ophthalmic solution Place 1 drop into both eyes 2 (two) times daily. 6 mL 1  . baclofen (LIORESAL) 20 MG tablet TAKE ONE-HALF TABLET BY MOUTH 3 TIMES DAILY FOR ONE WEEK, THEN INCREASE TO ONE TABLET 3 TIMES DAILY. 90 tablet 2  . diclofenac sodium (VOLTAREN) 1 % GEL Apply 2 g topically 4 (four) times daily. 100 g 3  . ibuprofen (ADVIL,MOTRIN) 800 MG tablet Take 1 tablet by mouth as needed.  1  . levETIRAcetam (KEPPRA) 500 MG tablet Take 1 tablet (500 mg total) by mouth 2 (two) times daily. 90 tablet 0  . levETIRAcetam (KEPPRA) 500 MG tablet TAKE 1 TABLET BY MOUTH TWICE DAILY *WILL  NEED  TO  KEEP  APPOINTMENT  ON  03-29-16  FOR  CONTINUED  REFILLS* 90 tablet 0  . levocetirizine (XYZAL) 5 MG tablet TAKE 1 TABLET BY MOUTH ONCE DAILY IN THE EVENING 30 tablet 3  . Multiple Vitamin (MULTIVITAMIN) capsule Take 1 capsule by mouth daily.     [name] olopatadine (PATANOL) 0.1 % ophthalmic solution  Place 1 drop into the left eye 2 (two) times daily. 10 mL 0  . ranitidine (ZANTAC) 300 MG capsule Take 1 capsule (300 mg total) by mouth every evening. 30 capsule 5  . temazepam (RESTORIL) 15 MG capsule TAKE 1 CAPSULE BY MOUTH AT BEDTIME AS NEEDED FOR SLEEP 30 capsule 2  . triamterene-hydrochlorothiazide (MAXZIDE) 75-50 MG tablet Take 0.5 tablets by mouth daily. Annual appt due in Sept must see provider for future refills 15 tablet 6  . escitalopram (LEXAPRO) 20 MG tablet Take 1 tablet (20 mg total) by mouth daily. 30 tablet 1  . pramipexole (MIRAPEX) 0.5 MG tablet TAKE 1 TABLET BY MOUTH AT BEDTIME AS NEEDED 30 tablet 1   No facility-administered medications prior to visit.      Past Medical History:  Diagnosis Date  . Angina   . Breast cyst   . Cancer ([place])   . Depression   . Hypertension   . RLS (restless legs syndrome) 11/16/2011  . Seizures ([place]) 2016   last seizure="last year, 2016" per pt.  . Stroke ([place]) 04/20/2011   a. 04/20/11 Left subcortical infarct treated w/ TPA;  b. 04/2011 Echo: EF 55-60%;  c. 04/2011 Normal Carotid u/s  d. Residual "walk w/limp on right; unable to grasp w/right hand"  . TIA (transient  ischemic attack) 08/2010  . Tobacco abuse    a. quit @ time of CVA 04/2011.      Review of Systems  Constitutional: Negative for chills and fever.  Respiratory: Negative for chest tightness and shortness of breath.   Psychiatric/Behavioral: Negative for behavioral problems, confusion, decreased concentration, dysphoric mood, hallucinations, self-injury, sleep disturbance and suicidal ideas. The patient is not nervous/anxious and is not hyperactive.       Objective:    BP 108/60 (BP Location: Left Arm, Patient Position: Sitting, Cuff Size: Normal)   Pulse 66   Temp 97.8 F (36.6 C) (Oral)   Resp 16   Ht 5\' 2"  (1.575 m)   Wt 138 lb (62.6 kg)   SpO2 96%   BMI 25.24 kg/m  Nursing note and vital signs reviewed.  Physical Exam  Constitutional: She is oriented to  person, place, and time. She appears well-developed and well-nourished. No distress.  Cardiovascular: Normal rate, regular rhythm, normal heart sounds and intact distal pulses.   Pulmonary/Chest: Effort normal and breath sounds normal.  Neurological: She is alert and oriented to person, place, and time.  Skin: Skin is warm and dry.  Psychiatric: She has a normal mood and affect. Her behavior is normal. Judgment and thought content normal.       Assessment & Plan:   Problem List Items Addressed This Visit      Other   Major depression, recurrent ([place]) - Primary    Discussed with patient proper medication administration taking 20 mg daily. Continue current dosage of Lexapro. No suicidal ideations or signs of psychosis. Follow-up in 3 months or sooner if needed.      Relevant Medications   escitalopram (LEXAPRO) 20 MG tablet    Other Visit Diagnoses    Need for influenza vaccination       Relevant Orders   Flu Vaccine QUAD 36+ mos IM (Completed)       I have changed Anna Lyons's pramipexole. I am also having her maintain her aspirin EC, diclofenac sodium, multivitamin, ranitidine, ibuprofen, olopatadine, levETIRAcetam, atorvastatin, baclofen, azelastine, levETIRAcetam, triamterene-hydrochlorothiazide, levocetirizine, temazepam, and escitalopram.   Meds ordered this encounter  Medications  . pramipexole (MIRAPEX) 0.5 MG tablet    Sig: Take 1 tablet (0.5 mg total) by mouth at bedtime as needed.    Dispense:  30 tablet    Refill:  1    Please consider 90 day supplies to promote better adherence  . escitalopram (LEXAPRO) 20 MG tablet    Sig: Take 1 tablet (20 mg total) by mouth daily.    Dispense:  30 tablet    Refill:  0    Order Specific Question:   Supervising Provider    Answer:   Pricilla Holm A [[id number]]     Follow-up: Return in about 3 months (around 01/27/2017), or if symptoms worsen or fail to improve.  Mauricio Po, FNP

## 2016-10-29 ENCOUNTER — Ambulatory Visit: Payer: Medicare Other | Admitting: Nurse Practitioner

## 2016-10-29 NOTE — Telephone Encounter (Signed)
Pt called in and said that lexapro is 72.00 and she can not afford that and wants to know why it is that much.  She thought it was only going to be $10.00 with the coupon.  She would like someeone to call her?

## 2016-10-29 NOTE — Telephone Encounter (Signed)
Spoke with pt about issue below. Called pharmacy to check on the coupon information and they stated that they were missing info on the coupon and did not use the coupon for her med refill. I called pt to let her know to go back to pharmacy and give them the coupon so they can put all the info in. They were missing a group #. Pt understood.

## 2016-11-02 ENCOUNTER — Encounter: Payer: Self-pay | Admitting: Family Medicine

## 2016-11-02 ENCOUNTER — Emergency Department (HOSPITAL_COMMUNITY)
Admission: EM | Admit: 2016-11-02 | Discharge: 2016-11-02 | Disposition: A | Discharge status: Home / Self Care | Payer: Medicare Other | Attending: Emergency Medicine | Admitting: Emergency Medicine

## 2016-11-02 ENCOUNTER — Encounter (HOSPITAL_COMMUNITY): Payer: Self-pay | Admitting: Nurse Practitioner

## 2016-11-02 ENCOUNTER — Ambulatory Visit (INDEPENDENT_AMBULATORY_CARE_PROVIDER_SITE_OTHER): Payer: Medicare Other | Admitting: Family Medicine

## 2016-11-02 VITALS — BP 80/50 | HR 68 | Temp 97.5°F | Ht 62.0 in | Wt 137.0 lb

## 2016-11-02 DIAGNOSIS — R5383 Other fatigue: Secondary | ICD-10-CM | POA: Diagnosis not present

## 2016-11-02 DIAGNOSIS — I959 Hypotension, unspecified: Secondary | ICD-10-CM | POA: Diagnosis present

## 2016-11-02 DIAGNOSIS — F17200 Nicotine dependence, unspecified, uncomplicated: Secondary | ICD-10-CM | POA: Insufficient documentation

## 2016-11-02 DIAGNOSIS — I9589 Other hypotension: Secondary | ICD-10-CM | POA: Insufficient documentation

## 2016-11-02 DIAGNOSIS — R5381 Other malaise: Secondary | ICD-10-CM | POA: Diagnosis not present

## 2016-11-02 DIAGNOSIS — R531 Weakness: Secondary | ICD-10-CM | POA: Diagnosis not present

## 2016-11-02 DIAGNOSIS — Z859 Personal history of malignant neoplasm, unspecified: Secondary | ICD-10-CM | POA: Insufficient documentation

## 2016-11-02 DIAGNOSIS — Z79899 Other long term (current) drug therapy: Secondary | ICD-10-CM | POA: Diagnosis not present

## 2016-11-02 DIAGNOSIS — R197 Diarrhea, unspecified: Secondary | ICD-10-CM | POA: Diagnosis not present

## 2016-11-02 DIAGNOSIS — E861 Hypovolemia: Secondary | ICD-10-CM

## 2016-11-02 DIAGNOSIS — Z7982 Long term (current) use of aspirin: Secondary | ICD-10-CM | POA: Diagnosis not present

## 2016-11-02 DIAGNOSIS — I10 Essential (primary) hypertension: Secondary | ICD-10-CM | POA: Diagnosis not present

## 2016-11-02 LAB — URINALYSIS, ROUTINE W REFLEX MICROSCOPIC
BACTERIA UA: NONE SEEN
Bilirubin Urine: NEGATIVE
Glucose, UA: NEGATIVE mg/dL
Hgb urine dipstick: NEGATIVE
KETONES UR: NEGATIVE mg/dL
Nitrite: NEGATIVE
PROTEIN: NEGATIVE mg/dL
RBC / HPF: NONE SEEN RBC/hpf (ref 0–5)
SQUAMOUS EPITHELIAL / LPF: NONE SEEN
Specific Gravity, Urine: 1.009 (ref 1.005–1.030)
pH: 6 (ref 5.0–8.0)

## 2016-11-02 LAB — CBC
HEMATOCRIT: 36.8 % (ref 36.0–46.0)
HEMOGLOBIN: 12.8 g/dL (ref 12.0–15.0)
MCH: 31.3 pg (ref 26.0–34.0)
MCHC: 34.8 g/dL (ref 30.0–36.0)
MCV: 90 fL (ref 78.0–100.0)
Platelets: 209 10*3/uL (ref 150–400)
RBC: 4.09 MIL/uL (ref 3.87–5.11)
RDW: 12.6 % (ref 11.5–15.5)
WBC: 4.2 10*3/uL (ref 4.0–10.5)

## 2016-11-02 LAB — BASIC METABOLIC PANEL
Anion gap: 10 (ref 5–15)
BUN: 15 mg/dL (ref 6–20)
CHLORIDE: 99 mmol/L — AB (ref 101–111)
CO2: 27 mmol/L (ref 22–32)
CREATININE: 0.74 mg/dL (ref 0.44–1.00)
Calcium: 9.6 mg/dL (ref 8.9–10.3)
GFR calc Af Amer: >60 mL/min (ref >60)
GFR calc non Af Amer: >60 mL/min (ref >60)
Glucose, Bld: 70 mg/dL (ref 65–99)
Potassium: 4.1 mmol/L (ref 3.5–5.1)
SODIUM: 136 mmol/L (ref 135–145)

## 2016-11-02 LAB — CBG MONITORING, ED: GLUCOSE-CAPILLARY: 68 mg/dL (ref 65–99)

## 2016-11-02 MED ORDER — ONDANSETRON HCL 4 MG/2ML IJ SOLN
4.0000 mg | Freq: Once | INTRAMUSCULAR | Status: AC
  Administered 2016-11-02: 4 mg via INTRAVENOUS
  Filled 2016-11-02: qty 2

## 2016-11-02 MED ORDER — SODIUM CHLORIDE 0.9 % IV BOLUS (SEPSIS)
1000.0000 mL | Freq: Once | INTRAVENOUS | Status: AC
Start: 2016-11-02 — End: 2016-11-02
  Administered 2016-11-02: 1000 mL via INTRAVENOUS

## 2016-11-02 MED ORDER — ALOSETRON HCL 0.5 MG PO TABS: 0.5000 mg | ORAL_TABLET | Freq: Two times a day (BID) | ORAL | 0 refills | Status: DC | PRN

## 2016-11-02 MED ORDER — ONDANSETRON HCL 4 MG PO TABS: 4.0000 mg | ORAL_TABLET | Freq: Three times a day (TID) | ORAL | 0 refills | Status: DC | PRN

## 2016-11-02 MED ORDER — DICYCLOMINE HCL 20 MG PO TABS
20.0000 mg | ORAL_TABLET | Freq: Once | ORAL | Status: AC
  Administered 2016-11-02: 20 mg via ORAL
  Filled 2016-11-02: qty 1

## 2016-11-02 NOTE — Assessment & Plan Note (Signed)
Has had several bouts of diarrhea and appears to be fluid down. No prior history of anemia. Unclear if she has a systemic illness or just feels unwell due to unable to stay hydrated. - She will travel by personal transportation to the [place] emergency room in order to be evaluated. Can consider IV hydration and lab work. Called and informed the charge nurse of her arrival.

## 2016-11-02 NOTE — Progress Notes (Signed)
Anna Lyons - 61 y.o. female MRN [id number]  Date of birth: 10-30-1955  SUBJECTIVE:  Including CC & ROS.  Chief Complaint  Patient presents with  . GI Problem    Patient states that she has been nauseous and having diarrhea since Saturday. Patient states that her mouth is very dry. No known fever. Peptobismol has been taken.     Ms. Rosello is a 61 year old female presenting with anorexia. She reports having dry mouth and nausea as well. The symptoms started over the weekend. She is progressively gotten worse. She has no energy to eat and just lies in bed the day. She is not had any fevers. She denies any new foods or travel. He denies any history of diabetes. Has had some diarrhea that started on Saturday. It is watery and nonbloody in nature. She has not been able to drink very often. Denies any rashes.     Review of Systems  Constitutional: Negative for fever.  Cardiovascular: Negative for chest pain.  Gastrointestinal: Positive for abdominal pain, diarrhea and nausea.  Genitourinary: Negative for dysuria.  Skin: Negative for rash.    HISTORY: Past Medical, Surgical, Social, and Family History Reviewed & Updated per EMR.   Pertinent Historical Findings include:  Past Medical History:  Diagnosis Date  . Angina   . Breast cyst   . Cancer ([place])   . Depression   . Hypertension   . RLS (restless legs syndrome) 11/16/2011  . Seizures ([place]) 2016   last seizure="last year, 2016" per pt.  . Stroke ([place]) 04/20/2011   a. 04/20/11 Left subcortical infarct treated w/ TPA;  b. 04/2011 Echo: EF 55-60%;  c. 04/2011 Normal Carotid u/s  d. Residual "walk w/limp on right; unable to grasp w/right hand"  . TIA (transient ischemic attack) 08/2010  . Tobacco abuse    a. quit @ time of CVA 04/2011.    Past Surgical History:  Procedure Laterality Date  . BREAST RECONSTRUCTION     bilaterally  . CARPAL TUNNEL RELEASE Left 1990's  . CARPAL TUNNEL RELEASE Left 12/08/2015  . CESAREAN SECTION  1979  .  KNEE ARTHROSCOPY Left 1990's    cartilage repair  . LEFT HEART CATHETERIZATION WITH CORONARY ANGIOGRAM N/A 05/20/2011   Procedure: LEFT HEART CATHETERIZATION WITH CORONARY ANGIOGRAM;  Surgeon: Josue Hector, MD;  Location: [facility] CATH LAB;  Service: Cardiovascular;  Laterality: N/A;  . MASTECTOMY Bilateral 1980's   bilateral with reconstruction    Allergies  Allergen Reactions  . Codeine Itching    Family History  Problem Relation Age of Onset  . Lupus Mother        died early 32's.  . Emphysema Father        died late 39's.  [name] COPD Father   . Pancreatic cancer Brother   . Bladder Cancer Brother   . Rectal cancer Brother   . Colon cancer Neg Hx      Social History   Social History  . Marital status: Widowed    Spouse name: N/A  . Number of children: 1  . Years of education: 54   Occupational History  . disabled    Social History Main Topics  . Smoking status: Current Every Day Smoker    Packs/day: 0.50    Years: 37.00    Types: Cigarettes  . Smokeless tobacco: Never Used  . Alcohol use 4.2 oz/week    7 Glasses of wine per week     Comment: very occasional  . Drug  use: No  . Sexual activity: No   Other Topics Concern  . Not on file   Social History Narrative   Pt lives alone.   Caffeine Use: 1-3 cups of caffeine daily (tea)     PHYSICAL EXAM:  VS: BP (!) 80/50 (BP Location: Left Arm, Patient Position: Sitting, Cuff Size: Normal)   Pulse 68   Temp (!) 97.5 F (36.4 C) (Oral)   Ht 5\' 2"  (1.575 m)   Wt 137 lb (62.1 kg)   SpO2 95%   BMI 25.06 kg/m  Physical Exam Gen: NAD, alert, cooperative with exam, ENT: normal lips, normal nasal mucosa,  Eye: normal EOM, normal conjunctiva and lids CV:  no edema, +2 pedal pulses, S1-S2, regular rhythm  Resp: no accessory muscle use, non-labored, clear to auscultation bilaterally wheezes or crackles GI: Soft, positive bowel sounds, no distention, some tenderness in lower quadrant, no hernia  Skin: no rashes, no  areas of induration  Neuro: normal tone, normal sensation to touch Psych:  normal insight, alert and oriented MSK: Normal gait, normal strength   ASSESSMENT & PLAN:   Malaise and fatigue Having some hypotension and hypothermia today. Unclear if she has a infectious cause or just related to poor hydration. She is normally hypertensive. Her last void was morning. Doesn't appear to be flulike in nature. Could be a viral gastroenteritis.   Diarrhea Has had several bouts of diarrhea and appears to be fluid down. No prior history of anemia. Unclear if she has a systemic illness or just feels unwell due to unable to stay hydrated. - She will travel by personal transportation to the [place] emergency room in order to be evaluated. Can consider IV hydration and lab work. Called and informed the charge nurse of her arrival.

## 2016-11-02 NOTE — Assessment & Plan Note (Addendum)
Having some hypotension and hypothermia today. Unclear if she has a infectious cause or just related to poor hydration. She is normally hypertensive. Her last void was morning. Doesn't appear to be flulike in nature. Could be a viral gastroenteritis.

## 2016-11-02 NOTE — ED Notes (Signed)
Patient would like to see the provider before blood is drawn or IV inserted.

## 2016-11-02 NOTE — ED Triage Notes (Signed)
Patient was seen at [facility] and sent over to [place] due to hypotension. Per information patient's bp during their visit was 80/50. Patient states she has been having diarrhea x3 days but none today. She states she feels weak although she has been able to drink and increase fluids over the past 24 hours. She is A&O x4, ambulatory. Denies any pain, fever, or shortness of breath, denies visual changes or headache.

## 2016-11-02 NOTE — Discharge Instructions (Signed)
Contact a health care provider if: You vomit. You have diarrhea. You have a fever for more than 2-3 days. You feel more thirsty than usual. You feel weak and tired. Get help right away if: You have chest pain. You have a fast or irregular heartbeat. You develop numbness in any part of your body. You cannot move your arms or your legs. You have trouble speaking. You become sweaty or feel light-headed. You faint. You feel short of breath. You have trouble staying awake. You feel confused.

## 2016-11-02 NOTE — ED Notes (Signed)
Pt cannot use restroom at this time, aware urine specimen is needed.  

## 2016-11-02 NOTE — ED Notes (Signed)
Coming from PCP-states diarrhea and general malaise for a couple of days-states BP in the 80's

## 2016-11-02 NOTE — Patient Instructions (Signed)
Thank you for coming in,   Please go to the [facility] ED to be evaluated.    Please feel free to call with any questions or concerns at any time, at [phone]. --Dr. Raeford Razor

## 2016-11-02 NOTE — ED Provider Notes (Signed)
[facility] DEPT Provider Note   CSN: [id number] Arrival date & time: 11/02/16  1410     History   Chief Complaint Chief Complaint  Patient presents with  . Hypotension  . Dizziness    HPI Anna Lyons is a 61 y.o. female who presents to the emergency department with chief complaint of hypotension. Patient was seen at her PCPs office earlier today and found to have a blood pressure of 80/60. She is feeling weak and tired. She developed diarrhea 2 days ago and had multiple episodes of loose stool. She also had nausea without vomiting and abdominal cramping and tenesmus. The patient is took Pepto-Bismol and has had resolution of her diarrhea and has had 2 days of solid stool and bowel movements. She denies fevers, chills, no ingestion of suspicious in periods, recent foreign travel. The patient has had continued cramping and nausea with poor appetite. HPI  Past Medical History:  Diagnosis Date  . Angina   . Breast cyst   . Cancer ([place])   . Depression   . Hypertension   . RLS (restless legs syndrome) 11/16/2011  . Seizures ([place]) 2016   last seizure="last year, 2016" per pt.  . Stroke ([place]) 04/20/2011   a. 04/20/11 Left subcortical infarct treated w/ TPA;  b. 04/2011 Echo: EF 55-60%;  c. 04/2011 Normal Carotid u/s  d. Residual "walk w/limp on right; unable to grasp w/right hand"  . TIA (transient ischemic attack) 08/2010  . Tobacco abuse    a. quit @ time of CVA 04/2011.    Patient Active Problem List   Diagnosis Date Noted  . Malaise and fatigue 11/02/2016  . Diarrhea 11/02/2016  . Eye problem 05/04/2016  . Cough 03/04/2016  . Abdominal pain, epigastric 10/07/2015  . Paresthesia of both feet 03/09/2015  . Routine general medical examination at a health care facility 11/15/2014  . Nausea without vomiting 10/09/2014  . Breast pain in female 12/21/2013  . Adhesive capsulitis of right shoulder 06/18/2013  . Hemiparesis affecting right side as late effect of cerebrovascular  accident ([place]) 06/18/2013  . Seizure ([place]) 05/19/2012  . NSTEMI, initial episode of care; Type 2 05/19/2012  . Anemia 11/17/2011  . RLS (restless legs syndrome) 11/16/2011  . Abnormal ECG 05/19/2011  . Essential hypertension, benign 05/19/2011  . Tobacco abuse 05/19/2011  . CVA (cerebral infarction) 04/23/2011  . Major depression, recurrent ([place])     Past Surgical History:  Procedure Laterality Date  . BREAST RECONSTRUCTION     bilaterally  . CARPAL TUNNEL RELEASE Left 1990's  . CARPAL TUNNEL RELEASE Left 12/08/2015  . CESAREAN SECTION  1979  . KNEE ARTHROSCOPY Left 1990's    cartilage repair  . LEFT HEART CATHETERIZATION WITH CORONARY ANGIOGRAM N/A 05/20/2011   Procedure: LEFT HEART CATHETERIZATION WITH CORONARY ANGIOGRAM;  Surgeon: Josue Hector, MD;  Location: [facility] CATH LAB;  Service: Cardiovascular;  Laterality: N/A;  . MASTECTOMY Bilateral 1980's   bilateral with reconstruction    OB History    No data available       Home Medications    Prior to Admission medications   Medication Sig Start Date End Date Taking? Authorizing Provider  aspirin EC 325 MG tablet Take 325 mg by mouth daily.    [provider]  atorvastatin (LIPITOR) 80 MG tablet TAKE 1 TABLET BY MOUTH ONCE DAILY 06/21/16   Hoyt Koch, MD  azelastine (OPTIVAR) 0.05 % ophthalmic solution Place 1 drop into both eyes 2 (two) times daily. 08/18/16  Golden Circle, FNP  baclofen (LIORESAL) 20 MG tablet TAKE ONE-HALF TABLET BY MOUTH 3 TIMES DAILY FOR ONE WEEK, THEN INCREASE TO ONE TABLET 3 TIMES DAILY. 08/10/16   Golden Circle, FNP  diclofenac sodium (VOLTAREN) 1 % GEL Apply 2 g topically 4 (four) times daily. 12/20/13   Hoyt Koch, MD  escitalopram (LEXAPRO) 20 MG tablet Take 1 tablet (20 mg total) by mouth daily. 10/28/16   Golden Circle, FNP  ibuprofen (ADVIL,MOTRIN) 800 MG tablet Take 1 tablet by mouth as needed. 11/13/15   [provider]  levETIRAcetam (KEPPRA)  500 MG tablet Take 1 tablet (500 mg total) by mouth 2 (two) times daily. 06/14/16   Garvin Fila, MD  levocetirizine (XYZAL) 5 MG tablet TAKE 1 TABLET BY MOUTH ONCE DAILY IN THE EVENING 09/27/16   Hoyt Koch, MD  Multiple Vitamin (MULTIVITAMIN) capsule Take 1 capsule by mouth daily.     [provider]  olopatadine (PATANOL) 0.1 % ophthalmic solution Place 1 drop into the left eye 2 (two) times daily. 05/04/16   Hoyt Koch, MD  pramipexole (MIRAPEX) 0.5 MG tablet Take 1 tablet (0.5 mg total) by mouth at bedtime as needed. 10/28/16   Golden Circle, FNP  ranitidine (ZANTAC) 300 MG capsule Take 1 capsule (300 mg total) by mouth every evening. 10/07/15   Plotnikov, Evie Lacks, MD  temazepam (RESTORIL) 15 MG capsule TAKE 1 CAPSULE BY MOUTH AT BEDTIME AS NEEDED FOR SLEEP 10/20/16   Hoyt Koch, MD  triamterene-hydrochlorothiazide (MAXZIDE) 75-50 MG tablet Take 0.5 tablets by mouth daily. Annual appt due in Sept must see provider for future refills 09/23/16   Hoyt Koch, MD    Family History Family History  Problem Relation Age of Onset  . Lupus Mother        died early 20's.  . Emphysema Father        died late 62's.  [name] COPD Father   . Pancreatic cancer Brother   . Bladder Cancer Brother   . Rectal cancer Brother   . Colon cancer Neg Hx     Social History Social History  Substance Use Topics  . Smoking status: Current Every Day Smoker    Packs/day: 0.50    Years: 37.00    Types: Cigarettes  . Smokeless tobacco: Never Used  . Alcohol use 4.2 oz/week    7 Glasses of wine per week     Comment: very occasional     Allergies   Codeine   Review of Systems Review of Systems Ten systems reviewed and are negative for acute change, except as noted in the HPI.    Physical Exam Updated Vital Signs BP 116/85 (BP Location: Left Arm)   Pulse 66   Temp 98.5 F (36.9 C) (Oral)   Resp 18   SpO2 99%   Physical Exam  Constitutional: She  is oriented to person, place, and time. She appears well-developed and well-nourished. No distress.  HENT:  Head: Normocephalic and atraumatic.  Eyes: Conjunctivae are normal. No scleral icterus.  Neck: Normal range of motion.  Cardiovascular: Normal rate, regular rhythm and normal heart sounds.  Exam reveals no gallop and no friction rub.   No murmur heard. Pulmonary/Chest: Effort normal and breath sounds normal. No respiratory distress.  Abdominal: Soft. She exhibits no distension (Diffuse mild tenderness) and no mass. There is tenderness. There is no guarding.  Hyperactive bowel sounds in all 4 quadrants  Neurological: She is  alert and oriented to person, place, and time.  Skin: Skin is warm and dry. She is not diaphoretic.  Psychiatric: Her behavior is normal.  Nursing note and vitals reviewed.    ED Treatments / Results  Labs (all labs ordered are listed, but only abnormal results are displayed) Labs Reviewed  BASIC METABOLIC PANEL  CBC  URINALYSIS, ROUTINE W REFLEX MICROSCOPIC  CBG MONITORING, ED    EKG  EKG Interpretation None       Radiology No results found.  Procedures Procedures (including critical care time)  Medications Ordered in ED Medications  sodium chloride 0.9 % bolus 1,000 mL (not administered)  ondansetron (ZOFRAN) injection 4 mg (not administered)  dicyclomine (BENTYL) tablet 20 mg (not administered)     Initial Impression / Assessment and Plan / ED Course  I have reviewed the triage vital signs and the nursing notes.  Pertinent labs & imaging results that were available during my care of the patient were reviewed by me and considered in my medical decision making (see chart for details).     Patient with dehydration and orthostatic hypotension. Her orthostatics are negative here in the emergency department after fluid resuscitation. Patient feels greatly improved. She is ambulatory and appears safe for discharge at this time. Return  precautions she will follow-up with her primary care physician X1 to 2 days.  Final Clinical Impressions(s) / ED Diagnoses   Final diagnoses:  Hypotension due to hypovolemia    New Prescriptions New Prescriptions   No medications on file     Ned Grace 11/03/16 0114    Jola Schmidt, MD 11/03/16 [phone]

## 2016-11-13 ENCOUNTER — Other Ambulatory Visit: Payer: Self-pay | Admitting: Neurology

## 2016-11-15 ENCOUNTER — Other Ambulatory Visit: Payer: Self-pay

## 2016-11-15 MED ORDER — LEVETIRACETAM 500 MG PO TABS: 500.0000 mg | ORAL_TABLET | Freq: Two times a day (BID) | ORAL | 3 refills | Status: DC

## 2016-11-16 ENCOUNTER — Ambulatory Visit (INDEPENDENT_AMBULATORY_CARE_PROVIDER_SITE_OTHER): Payer: No Typology Code available for payment source | Admitting: Licensed Clinical Social Worker

## 2016-11-16 DIAGNOSIS — F324 Major depressive disorder, single episode, in partial remission: Secondary | ICD-10-CM | POA: Diagnosis not present

## 2016-12-01 ENCOUNTER — Ambulatory Visit (INDEPENDENT_AMBULATORY_CARE_PROVIDER_SITE_OTHER): Payer: No Typology Code available for payment source | Admitting: Licensed Clinical Social Worker

## 2016-12-01 DIAGNOSIS — F331 Major depressive disorder, recurrent, moderate: Secondary | ICD-10-CM

## 2016-12-02 ENCOUNTER — Ambulatory Visit (INDEPENDENT_AMBULATORY_CARE_PROVIDER_SITE_OTHER): Payer: Medicare Other | Admitting: Family Medicine

## 2016-12-02 ENCOUNTER — Encounter: Payer: Self-pay | Admitting: Family Medicine

## 2016-12-02 VITALS — BP 118/78 | HR 77 | Temp 97.9°F | Ht 62.0 in | Wt 134.0 lb

## 2016-12-02 DIAGNOSIS — F332 Major depressive disorder, recurrent severe without psychotic features: Secondary | ICD-10-CM

## 2016-12-02 MED ORDER — MIRTAZAPINE 15 MG PO TABS
15.0000 mg | ORAL_TABLET | Freq: Every day | ORAL | 3 refills | Status: DC
Start: 2016-12-02 — End: 2017-04-01

## 2016-12-02 NOTE — Progress Notes (Signed)
Anna Lyons - 61 y.o. female MRN [id number]  Date of birth: [birth year]  SUBJECTIVE:  Including CC & ROS.  Chief Complaint  Patient presents with  . Depression    She states her Lexapro was changed from 10mg  to 20mg  on 10/28/16-she is still having a hard time and feels it is not helping.     Anna Lyons is a 61 y.o. female that is presenting with acute on chronic depression.She has a long history of depression. She has been on Zoloft and then transition to Lexapro. She has been increased from 10 mg a 20 mg. She feels that her depression is significant in nature. She has bouts of spontaneously crying. She also has nervousness when she is in public around groups of people. She does not interact with her family members like she once used to. She has lost interest in things that she used to please are. Stays in bed most all day. Does not do much for exercise. Her only comfort is her dog. She still is in brief over the loss of her husband from 4 years ago. She has friends but has not talked to them in some time. Denies any HI. Has had thoughts of SI but does never made a plan. Spoke with her psychologist yesterday who expressed that she seemed to be improved. She also had advised her to follow-up to consider change in her medication regimen.    Review of Systems  Constitutional: Positive for activity change and appetite change. Negative for fever.  Cardiovascular: Negative for chest pain.  Psychiatric/Behavioral: Positive for decreased concentration and sleep disturbance. Negative for self-injury and suicidal ideas. The patient is nervous/anxious.     HISTORY: Past Medical, Surgical, Social, and Family History Reviewed & Updated per EMR.   Pertinent Historical Findings include:  Past Medical History:  Diagnosis Date  . Angina   . Breast cyst   . Cancer ([place])   . Depression   . Hypertension   . RLS (restless legs syndrome) 11/16/2011  . Seizures ([place]) 2016   last seizure="last year, 2016" per pt.   . Stroke ([place]) 04/20/2011   a. 04/20/11 Left subcortical infarct treated w/ TPA;  b. 04/2011 Echo: EF 55-60%;  c. 04/2011 Normal Carotid u/s  d. Residual "walk w/limp on right; unable to grasp w/right hand"  . TIA (transient ischemic attack) 08/2010  . Tobacco abuse    a. quit @ time of CVA 04/2011.    Past Surgical History:  Procedure Laterality Date  . BREAST RECONSTRUCTION     bilaterally  . CARPAL TUNNEL RELEASE Left 1990's  . CARPAL TUNNEL RELEASE Left 12/08/2015  . CESAREAN SECTION  1979  . KNEE ARTHROSCOPY Left 1990's    cartilage repair  . LEFT HEART CATHETERIZATION WITH CORONARY ANGIOGRAM N/A 05/20/2011   Procedure: LEFT HEART CATHETERIZATION WITH CORONARY ANGIOGRAM;  Surgeon: Josue Hector, MD;  Location: [facility] CATH LAB;  Service: Cardiovascular;  Laterality: N/A;  . MASTECTOMY Bilateral 1980's   bilateral with reconstruction    Allergies  Allergen Reactions  . Codeine Itching    Family History  Problem Relation Age of Onset  . Lupus Mother        died early 40's.  . Emphysema Father        died late 26's.  [name] COPD Father   . Pancreatic cancer Brother   . Bladder Cancer Brother   . Rectal cancer Brother   . Colon cancer Neg Hx      Social History  Social History  . Marital status: Widowed    Spouse name: N/A  . Number of children: 1  . Years of education: 24   Occupational History  . disabled    Social History Main Topics  . Smoking status: Current Every Day Smoker    Packs/day: 0.50    Years: 37.00    Types: Cigarettes  . Smokeless tobacco: Never Used  . Alcohol use 4.2 oz/week    7 Glasses of wine per week     Comment: very occasional  . Drug use: No  . Sexual activity: No   Other Topics Concern  . Not on file   Social History Narrative   Pt lives alone.   Caffeine Use: 1-3 cups of caffeine daily (tea)     PHYSICAL EXAM:  VS: BP 118/78 (BP Location: Left Arm, Patient Position: Sitting, Cuff Size: Normal)   Pulse 77   Temp 97.9 F (36.6  C) (Oral)   Ht 5\' 2"  (1.575 m)   Wt 134 lb (60.8 kg)   SpO2 97%   BMI 24.51 kg/m  Physical Exam Gen: NAD, alert, cooperative with exam, Tearful during exam ENT: normal lips, normal nasal mucosa,  Eye: normal EOM, normal conjunctiva and lids CV:  no edema, +2 pedal pulses   Resp: no accessory muscle use, non-labored,  Skin: no rashes, no areas of induration  Neuro: normal tone, normal sensation to touch Psych:  normal insight, alert and oriented MSK: Normal gait, normal strength    GAD 7 : Generalized Anxiety Score 12/02/2016 07/19/2016  Nervous, Anxious, on Edge 3 3  Control/stop worrying 3 2  Worry too much - different things 2 2  Trouble relaxing 2 2  Restless 2 1  Easily annoyed or irritable 1 2  Afraid - awful might happen 3 2  Total GAD 7 Score 16 14    Depression screen [facility] 2/9 12/02/2016 09/01/2016 07/19/2016 05/24/2014  Decreased Interest 3 1 2 3   Down, Depressed, Hopeless 3 1 3 2   PHQ - 2 Score 6 2 5 5   Altered sleeping 3 1 1  0  Tired, decreased energy 3 0 3 1  Change in appetite 3 3 3 2   Feeling bad or failure about yourself  3 1 3 3   Trouble concentrating 3 1 2 3   Moving slowly or fidgety/restless 2 0 2 0  Suicidal thoughts 1 0 3 0  PHQ-9 Score 24 8 22 14   Difficult doing work/chores - Not difficult at all - Not difficult at all     ASSESSMENT & PLAN:   I spent 25 minutes with this patient, greater than 50% was face-to-face time counseling regarding the below diagnosis.   Major depression, recurrent ([place]) No thoughts of self-harm. Has a long history of depression. Currently doesn't Lexapro does not seem to be effective. - We'll start Remeron. Can increase the Remeron as she follows up. Could transitioning from the Lexapro to Remeron and then add buspirone. - Counseled on depression. Counseled on obtaining exercise. Counseled on the new medication. - Advised to follow-up in 2 weeks

## 2016-12-02 NOTE — Patient Instructions (Signed)
Thank you for coming in,   Please follow up in two weeks to let us know how you're feeling.    Please feel free to call with any questions or concerns at any time, at [phone]. --Dr. Raeford Razor

## 2016-12-02 NOTE — Assessment & Plan Note (Addendum)
No thoughts of self-harm. Has a long history of depression. Currently doesn't Lexapro does not seem to be effective. - We'll start Remeron. Can increase the Remeron as she follows up. Could transitioning from the Lexapro to Remeron and then add buspirone. - Counseled on depression. Counseled on obtaining exercise. Counseled on the new medication. - Advised to follow-up in 2 weeks

## 2016-12-06 ENCOUNTER — Ambulatory Visit: Payer: Self-pay | Admitting: Licensed Clinical Social Worker

## 2016-12-07 ENCOUNTER — Ambulatory Visit (INDEPENDENT_AMBULATORY_CARE_PROVIDER_SITE_OTHER): Payer: No Typology Code available for payment source | Admitting: Licensed Clinical Social Worker

## 2016-12-07 DIAGNOSIS — F3341 Major depressive disorder, recurrent, in partial remission: Secondary | ICD-10-CM | POA: Diagnosis not present

## 2016-12-11 ENCOUNTER — Other Ambulatory Visit: Payer: Self-pay | Admitting: Internal Medicine

## 2016-12-16 ENCOUNTER — Ambulatory Visit: Payer: Self-pay | Admitting: Internal Medicine

## 2016-12-28 ENCOUNTER — Encounter: Payer: Self-pay | Admitting: Internal Medicine

## 2016-12-28 ENCOUNTER — Ambulatory Visit: Payer: Medicare Other | Admitting: Internal Medicine

## 2016-12-28 VITALS — BP 90/50 | HR 74 | Temp 98.2°F | Ht 62.0 in | Wt 141.0 lb

## 2016-12-28 DIAGNOSIS — R062 Wheezing: Secondary | ICD-10-CM | POA: Diagnosis not present

## 2016-12-28 DIAGNOSIS — R05 Cough: Secondary | ICD-10-CM | POA: Diagnosis not present

## 2016-12-28 DIAGNOSIS — R059 Cough, unspecified: Secondary | ICD-10-CM

## 2016-12-28 MED ORDER — TRIAMTERENE-HCTZ 37.5-25 MG PO TABS: 0.5000 | ORAL_TABLET | Freq: Every day | ORAL | 0 refills | Status: DC

## 2016-12-28 MED ORDER — IPRATROPIUM-ALBUTEROL 0.5-2.5 (3) MG/3ML IN SOLN
3.0000 mL | Freq: Once | RESPIRATORY_TRACT | Status: AC
  Administered 2016-12-28: 3 mL via RESPIRATORY_TRACT

## 2016-12-28 MED ORDER — DOXYCYCLINE HYCLATE 100 MG PO TABS: 100.0000 mg | ORAL_TABLET | Freq: Two times a day (BID) | ORAL | 0 refills | Status: DC

## 2016-12-28 MED ORDER — ALBUTEROL SULFATE HFA 108 (90 BASE) MCG/ACT IN AERS
2.0000 | INHALATION_SPRAY | Freq: Four times a day (QID) | RESPIRATORY_TRACT | 0 refills | Status: DC | PRN
Start: 2016-12-28 — End: 2023-05-23

## 2016-12-28 MED ORDER — PREDNISONE 20 MG PO TABS: 40.0000 mg | ORAL_TABLET | Freq: Every day | ORAL | 0 refills | Status: DC

## 2016-12-28 NOTE — Patient Instructions (Signed)
We have sent in albuterol inhaler to use if needed for shortness of breath.  We have sent in the prednisone to take 2 pills daily for 5 days.   We have sent in the antibiotic doxycycline to take 1 pill twice a day.

## 2016-12-28 NOTE — Assessment & Plan Note (Signed)
Suspect COPD with exacerbation today, lots of expiratory wheezing on exam. Rx for prednisone and doxycycline. Given duo neb in the office with good resolution of wheezing. Rx for albuterol inhaler.

## 2016-12-28 NOTE — Progress Notes (Signed)
   Subjective:    Patient ID: Anna Lyons, female    DOB: 05-05-1955, 61 y.o.   MRN: [id number]  HPI The patient is a 61 YO female coming in for cough and wheezing. Started about 4 days ago and overall worsening. Is a current smoker. Worse with lying down. More SOB with activity. Some nose drainage and congestion with post nasal drip. She denies fevers or chills.   Review of Systems  Constitutional: Positive for activity change, appetite change and chills. Negative for fatigue, fever and unexpected weight change.  HENT: Positive for congestion. Negative for ear discharge, ear pain, postnasal drip, rhinorrhea, sinus pressure, sinus pain, sneezing, sore throat, tinnitus, trouble swallowing and voice change.   Eyes: Negative.   Respiratory: Positive for cough, shortness of breath and wheezing. Negative for chest tightness.   Cardiovascular: Negative.   Gastrointestinal: Negative.   Musculoskeletal: Positive for myalgias.  Neurological: Negative.       Objective:   Physical Exam  Constitutional: She is oriented to person, place, and time. She appears well-developed and well-nourished.  HENT:  Head: Normocephalic and atraumatic.  Right Ear: External ear normal.  Left Ear: External ear normal.  Mouth/Throat: Oropharynx is clear and moist.  Eyes: EOM are normal.  Neck: Normal range of motion. No thyromegaly present.  Cardiovascular: Normal rate and regular rhythm.  Pulmonary/Chest: Effort normal. No respiratory distress. She has wheezes. She has no rales.  Significant wheezing with expiration, after duo neb wheezing is almost gone.  Abdominal: Soft.  Musculoskeletal: She exhibits tenderness.  Lymphadenopathy:    She has no cervical adenopathy.  Neurological: She is alert and oriented to person, place, and time.  Skin: Skin is warm and dry.   Vitals:   12/28/16 1312  BP: (!) 90/50  Pulse: 74  Temp: 98.2 F (36.8 C)  TempSrc: Oral  SpO2: 98%  Weight: 141 lb (64 kg)  Height: 5\' 2"   (1.575 m)      Assessment & Plan:  Duo neb administered at visit

## 2017-01-17 ENCOUNTER — Other Ambulatory Visit: Payer: Self-pay | Admitting: Family

## 2017-01-17 ENCOUNTER — Other Ambulatory Visit: Payer: Self-pay | Admitting: Internal Medicine

## 2017-01-20 ENCOUNTER — Ambulatory Visit (INDEPENDENT_AMBULATORY_CARE_PROVIDER_SITE_OTHER): Payer: No Typology Code available for payment source | Admitting: Licensed Clinical Social Worker

## 2017-01-20 DIAGNOSIS — F3341 Major depressive disorder, recurrent, in partial remission: Secondary | ICD-10-CM

## 2017-01-22 ENCOUNTER — Other Ambulatory Visit: Payer: Self-pay | Admitting: Family

## 2017-01-22 ENCOUNTER — Other Ambulatory Visit: Payer: Self-pay | Admitting: Internal Medicine

## 2017-01-28 ENCOUNTER — Telehealth: Payer: Self-pay | Admitting: Internal Medicine

## 2017-01-28 NOTE — Telephone Encounter (Signed)
Copied from [place] [id number]. Topic: Quick Communication - Rx Refill/Question >> Jan 28, 2017  3:31 PM Yvette Rack wrote: Has the patient contacted their pharmacy? Yes.     (Agent: If no, request that the patient contact the pharmacy for the refill.)baclofen (LIORESAL) 20 MG tablet    Preferred Pharmacy (with phone number or street name): [facility], [place] [phone] (Phone) [phone] (Fax)     Agent: Please be advised that RX refills may take up to 3 business days. We ask that you follow-up with your pharmacy.

## 2017-01-31 ENCOUNTER — Other Ambulatory Visit: Payer: Self-pay | Admitting: Internal Medicine

## 2017-02-02 MED ORDER — BACLOFEN 20 MG PO TABS: ORAL_TABLET | ORAL | 2 refills | Status: DC

## 2017-02-02 NOTE — Telephone Encounter (Signed)
Done. Thx.

## 2017-02-02 NOTE — Telephone Encounter (Signed)
MD is put of the office pls advise on refill.Marland KitchenJohny Lyons

## 2017-02-16 NOTE — Progress Notes (Signed)
[facility]  PATIENT: Anna Lyons DOB: [birth year]   REASON FOR VISIT: Follow-up for history of stroke in 2013, seizure disorder , mild cognitive impairment long history of depression HISTORY FROM: Patient    HISTORY OF PRESENT ILLNESS:Anna Lyons is a 62 year old female with a history of stroke. She returns today for follow-up. The patient continues on aspirin for stroke prevention. She denies any significant bruising or bleeding. The patient's blood pressure and cholesterol has been managed by her primary care provider. Her blood pressure is in good control today. The patient continues to use baclofen for spasticity and she reports that this is working well. Her primary care provides her with Klonopin to help with restless leg symptoms, she was most recently  started on Mirapex in addition to the [place]. Patient reports that since the last visit she had a fall. This occurred in September. She states that she does not recall any events surrounding the fall other than that she was going to the bathroom and apparently  fell and hit her head. She did call her daughter who came and took her to the emergency room. Patient states that she has  trouble with her balance and she did not use her walker when she went to the bathroom. She states that in the emergency room she was diagnosed with a concussion and had a hematoma from the fall. Since then the patient has continued to have a headache daily. She is also very sleepy throughout the day. She denies any other symptoms. She denies any seizure events. She is continued to take Keppra 250 mg twice a day. After her fall she denies biting her tongue or loss of bowels or bladder. She returns today for an evaluation. Update 05/14/2014 PS: She returns for follow-up after last visit 6 months ago. She is a complaint bad daughter. She remains in a logical stable without recurrent stroke or TIA symptoms. She states her blood pressure is under good  control. Last lipid profile checked several months ago was fine. She remains on aspirin which is tolerating well without bleeding, bruising. She has not had any seizures and she left the hospital. At last visit the [place] dose was reduced and she is tolerating well without breakthrough seizures. She remains on baclofen 20 mg 3 times daily which seems to help her spasticity and she is tolerating it well. She has noted increase in restless leg symptoms and she takes Klonopin 1 mg at [facility] which seems to help. She has tried Requip in the past which has not work for her. She was recently changed from Zoloft to Prozac for depression and it seems to be helping. She has not had any carotid Dopplers done for nearly a year. She is able to walk with a wheeled walker and has had no recent falls.  Update 05/26/2015 : She returns for follow-up after last visit 1 year ago with nurse practitioner. Patient has a new complaint of worsening of her feet paresthesias particularly since she started a new part-time job in which she has to stay on her feet for 4 hours. She reports significant burning of her toes as well as the soles mostly in the right leg but the left leg also to lesser degree. She does have a diagnosis of restless leg syndrome and takes Klonopin and Mirapex at [facility] but has not yet tried taking either medication during the daytime. She does admit to her depression be not adequately treated. She has been on Cymbalta 60 mg daily  since the death of her husband 3 years ago. She has not had any falls or injuries. She has been taking baclofen 20 mg 3 times daily for her post stroke spasticity and walking difficulty and is tolerating it well without any side effects. She is worried that she may have peripheral neuropathy and has not had nerve conduction evaluation for that Update 03/29/2016 PS; she returns for follow-up after last visit 10 months ago. She is doing well without recurrent stroke or TIA symptoms since November  2013. She states she had a possible TIA last year and was seen at: [facility] and went home. She complains of decreased energy and poor stamina. Memory is also been quite poor. Patient was previously on Cymbalta for depression and previously had failed Zoloft. She was recently switched to Wellbutrin 150 mg twice daily but she feels her depression is still not adequately treated. She has not been working. She is quite emotional. She cries easily. She has been managed for depression by her primary care physician Dr. Sharlet Lyons. She has not seen a psychiatrist. Patient continued to tolerate Keppra well and has not had any breakthrough seizures this year. She states her blood pressure is well controlled today it is 125/60. She is tolerating Lipitor well without muscle aches or pains. She complains of some burning in her feet and his seeing a foot doctor for that. She did wear her orthotic implant but that does not seem to affect her walking. She has had nerve conduction EMG done in the past by Dr. Charlestine Lyons which was apparently not suggestive of neuropathy. She recently had surgery in the left hand for trigger finger. UPDATE 1/17/2019CM Ms. Anna Lyons, 63 year old female returns for follow-up with history of stroke in November 2013.  She is currently on aspirin and Lipitor for secondary stroke prevention without further stroke or TIA symptoms.  She has minimal bruising and no bleeding she continues to complain with memory issues however she continues to work full-time and has not had problems with her job.  She has a long history of depression but has never seen psychiatry.  She is currently on Lexapro.  Patient also has seizure disorder and is on Keppra 500 twice daily without any seizure activity since last seen.  She denies any side effects to the medication.  EMG nerve conduction in the past not suggestive of neuropathy.  She says she occasionally has balance issues and uses her walker.  She does not have an assistive device  today.  She says she drives without difficulty.  Blood pressure in the office today 101/66.  She was encouraged to stay well-hydrated.  She is on baclofen for spasticity and Mirapex for restless legs through  primary care provider.  She returns for reevaluation  REVIEW OF SYSTEMS: Full 14 system review of systems performed and notable only for those listed, all others are neg:  Constitutional: Fatigue Cardiovascular: neg Ear/Nose/Throat: neg  Skin: neg Eyes: neg Respiratory: neg Gastroitestinal: neg  Hematology/Lymphatic: neg  Endocrine: neg Musculoskeletal: Balance issues at home Allergy/Immunology: neg Neurological: neg Psychiatric: Depression Sleep : neg   ALLERGIES: Allergies  Allergen Reactions  . Codeine Itching    HOME MEDICATIONS: Outpatient Medications Prior to Visit  Medication Sig Dispense Refill  . alosetron (LOTRONEX) 0.5 MG tablet Take 1 tablet (0.5 mg total) by mouth 2 (two) times daily as needed. cramping 20 tablet 0  . aspirin EC 325 MG tablet Take 325 mg by mouth daily.    [name] atorvastatin (LIPITOR) 80 MG tablet  TAKE 1 TABLET BY MOUTH ONCE DAILY 90 tablet 0  . baclofen (LIORESAL) 20 MG tablet TAKE ONE-HALF TABLET BY MOUTH 3 TIMES DAILY FOR ONE WEEK, THEN INCREASE TO ONE TABLET 3 TIMES DAILY. 90 tablet 2  . diclofenac sodium (VOLTAREN) 1 % GEL Apply 2 g topically 4 (four) times daily. (Patient taking differently: Apply 2 g topically 4 (four) times daily as needed (pain). ) 100 g 3  . doxycycline (VIBRA-TABS) 100 MG tablet Take 1 tablet (100 mg total) by mouth 2 (two) times daily. 14 tablet 0  . escitalopram (LEXAPRO) 20 MG tablet Take 1 tablet (20 mg total) by mouth daily. 30 tablet 0  . ibuprofen (ADVIL,MOTRIN) 800 MG tablet Take 1 tablet by mouth every 8 (eight) hours as needed for moderate pain.   1  . levETIRAcetam (KEPPRA) 500 MG tablet Take 1 tablet (500 mg total) by mouth 2 (two) times daily. 180 tablet 3  . levocetirizine (XYZAL) 5 MG tablet TAKE 1 TABLET  BY MOUTH ONCE DAILY IN THE EVENING 30 tablet 3  . mirtazapine (REMERON) 15 MG tablet Take 1 tablet (15 mg total) by mouth at bedtime. 30 tablet 3  . Multiple Vitamin (MULTIVITAMIN) capsule Take 1 capsule by mouth daily.     . ondansetron (ZOFRAN) 4 MG tablet Take 1 tablet (4 mg total) by mouth every 8 (eight) hours as needed for nausea or vomiting. 10 tablet 0  . pramipexole (MIRAPEX) 0.5 MG tablet Take 1 tablet (0.5 mg total) by mouth at bedtime as needed. (Patient taking differently: Take 0.5 mg by mouth at bedtime as needed (sleep). ) 30 tablet 1  . predniSONE (DELTASONE) 20 MG tablet Take 2 tablets (40 mg total) by mouth daily with breakfast. 10 tablet 0  . ranitidine (ZANTAC) 300 MG capsule Take 1 capsule (300 mg total) by mouth every evening. 30 capsule 5  . temazepam (RESTORIL) 15 MG capsule TAKE 1 CAPSULE BY MOUTH AT BEDTIME AS NEEDED FOR SLEEP 30 capsule 2  . triamterene-hydrochlorothiazide (MAXZIDE-25) 37.5-25 MG tablet Take 0.5 tablets by mouth daily. 45 tablet 0  . azelastine (OPTIVAR) 0.05 % ophthalmic solution Place 1 drop into both eyes 2 (two) times daily. 6 mL 1  . olopatadine (PATANOL) 0.1 % ophthalmic solution Place 1 drop into the left eye 2 (two) times daily. 10 mL 0  . albuterol (PROVENTIL HFA;VENTOLIN HFA) 108 (90 Base) MCG/ACT inhaler Inhale 2 puffs into the lungs every 6 (six) hours as needed for wheezing or shortness of breath. (Patient not taking: Reported on 02/17/2017) 1 Inhaler 0   No facility-administered medications prior to visit.     PAST MEDICAL HISTORY: Past Medical History:  Diagnosis Date  . Angina   . Breast cyst   . Cancer ([place])   . Depression   . Hypertension   . RLS (restless legs syndrome) 11/16/2011  . Seizures ([place]) 2016   last seizure="last year, 2016" per pt.  . Stroke ([place]) 04/20/2011   a. 04/20/11 Left subcortical infarct treated w/ TPA;  b. 04/2011 Echo: EF 55-60%;  c. 04/2011 Normal Carotid u/s  d. Residual "walk w/limp on right; unable to  grasp w/right hand"  . TIA (transient ischemic attack) 08/2010  . Tobacco abuse    a. quit @ time of CVA 04/2011.    PAST SURGICAL HISTORY: Past Surgical History:  Procedure Laterality Date  . BREAST RECONSTRUCTION     bilaterally  . CARPAL TUNNEL RELEASE Left 1990's  . CARPAL TUNNEL RELEASE Left 12/08/2015  .  CESAREAN SECTION  1979  . KNEE ARTHROSCOPY Left 1990's    cartilage repair  . LEFT HEART CATHETERIZATION WITH CORONARY ANGIOGRAM N/A 05/20/2011   Procedure: LEFT HEART CATHETERIZATION WITH CORONARY ANGIOGRAM;  Surgeon: Josue Hector, MD;  Location: [facility] CATH LAB;  Service: Cardiovascular;  Laterality: N/A;  . MASTECTOMY Bilateral 1980's   bilateral with reconstruction    FAMILY HISTORY: Family History  Problem Relation Age of Onset  . Lupus Mother        died early 33's.  . Emphysema Father        died late 17's.  [name] COPD Father   . Pancreatic cancer Brother   . Bladder Cancer Brother   . Rectal cancer Brother   . Suicidality Brother   . Colon cancer Neg Hx     SOCIAL HISTORY: Social History   Socioeconomic History  . Marital status: Widowed    Spouse name: Not on file  . Number of children: 1  . Years of education: 29  . Highest education level: Not on file  Social Needs  . Financial resource strain: Not on file  . Food insecurity - worry: Not on file  . Food insecurity - inability: Not on file  . Transportation needs - medical: Not on file  . Transportation needs - non-medical: Not on file  Occupational History  . Occupation: disabled  Tobacco Use  . Smoking status: Current Every Day Smoker    Packs/day: 0.50    Years: 37.00    Pack years: 18.50    Types: Cigarettes  . Smokeless tobacco: Never Used  Substance and Sexual Activity  . Alcohol use: Yes    Alcohol/week: 4.2 oz    Types: 7 Glasses of wine per week    Comment: very occasional  . Drug use: No  . Sexual activity: No  Other Topics Concern  . Not on file  Social History Narrative   Pt  lives alone.   Caffeine Use: 1-3 cups of caffeine daily (tea)     PHYSICAL EXAM  Vitals:   02/17/17 1332  BP: 101/66  Pulse: 72  Weight: 145 lb 9.6 oz (66 kg)   Body mass index is 26.63 kg/m.  Generalized: Well developed, in no acute distress  Head: normocephalic and atraumatic,. Oropharynx benign  Neck: Supple,   Musculoskeletal: No deformity   Neurological examination   Mentation: Alert oriented to time, place, history taking.  Slightly diminished attention span short-term memory   Follows all commands speech and language fluent.   Cranial nerve II-XII: Pupils were equal round reactive to light extraocular movements were full, visual field were full on confrontational test. Facial sensation and strength were normal. hearing was intact to finger rubbing bilaterally. Uvula tongue midline. head turning and shoulder shrug were normal and symmetric.Tongue protrusion into cheek strength was normal. Motor: normal bulk and tone, full strength in the BUE, BLE,  Sensory: Diminished touch and pinprick and vibratory in both feet over the toes only .  Coordination: finger-nose-finger, heel-to-shin bilaterally, no dysmetria Reflexes: Symmetric upper and lower except diminished right ankle jerk, plantar responses were flexor bilaterally. Gait and Station: Rising up from seated position without assistance, normal stance,  moderate stride, good arm swing, smooth turning, able to perform tiptoe, and heel walking without difficulty. Tandem gait is unsteady.  No assistive device  DIAGNOSTIC DATA (LABS, IMAGING, TESTING) - I reviewed patient records, labs, notes, testing and imaging myself where available.  Lab Results  Component Value Date   WBC  4.2 11/02/2016   HGB 12.8 11/02/2016   HCT 36.8 11/02/2016   MCV 90.0 11/02/2016   PLT 209 11/02/2016      Component Value Date/Time   NA 136 11/02/2016 1624   K 4.1 11/02/2016 1624   CL 99 (L) 11/02/2016 1624   CO2 27 11/02/2016 1624   GLUCOSE  70 11/02/2016 1624   BUN 15 11/02/2016 1624   CREATININE 0.74 11/02/2016 1624   CALCIUM 9.6 11/02/2016 1624   PROT 7.1 10/07/2015 1344   ALBUMIN 4.1 10/07/2015 1344   AST 19 10/07/2015 1344   ALT 14 10/07/2015 1344   ALKPHOS 69 10/07/2015 1344   BILITOT 0.3 10/07/2015 1344   GFRNONAA >60 11/02/2016 1624   GFRAA >60 11/02/2016 1624    Lab Results  Component Value Date   HGBA1C 5.8 03/05/2015   Lab Results  Component Value Date   VITAMINB12 468 10/15/2015   Lab Results  Component Value Date   TSH 2.84 10/15/2015      ASSESSMENT AND PLAN  62 y.o. year old female  has a past medical history of Angina,  Depression, Hypertension, RLS (restless legs syndrome) (11/16/2011), Seizures ([place]) (2016), Stroke ([place]) (04/20/2011), TIA (transient ischemic attack) (08/2010), here to follow-up for her seizure disorder and history of stroke.   PLAN: Continue aspirin for secondary stroke prevention  continue Lipitor for secondary stroke prevention Blood pressure in the office today 101/66, make sure you stay well-hydrated Continue Keppra 500 mg twice daily for seizure control Call for any seizure activity Continue treatment for depression Be careful with ambulation, fall risk in the past. Follow-up yearly and as needed I spent 25 minutes in total face to face time with the patient more than 50% of which was spent counseling and coordination of care, reviewing test results reviewing medications and discussing and reviewing the diagnosis of stroke and management of risk factors, seizure disorder and avoidance of seizure triggers long-term treatment of depression and importance of staying on therapy. Dennie Bible, [facility], [facility], APRN  [facility] [address] [place], [place] [address] [phone]

## 2017-02-17 ENCOUNTER — Encounter: Payer: Self-pay | Admitting: Nurse Practitioner

## 2017-02-17 ENCOUNTER — Ambulatory Visit: Payer: Medicare Other | Admitting: Nurse Practitioner

## 2017-02-17 VITALS — BP 101/66 | HR 72 | Wt 145.6 lb

## 2017-02-17 DIAGNOSIS — R569 Unspecified convulsions: Secondary | ICD-10-CM | POA: Diagnosis not present

## 2017-02-17 DIAGNOSIS — Z8673 Personal history of transient ischemic attack (TIA), and cerebral infarction without residual deficits: Secondary | ICD-10-CM | POA: Diagnosis not present

## 2017-02-17 NOTE — Patient Instructions (Signed)
Continue aspirin for secondary stroke prevention  continue Lipitor for secondary stroke prevention Blood pressure in the office today 101/66, make sure you stay well-hydrated Continue Keppra 500 mg twice daily for seizure control Follow-up yearly and as needed

## 2017-02-20 NOTE — Progress Notes (Signed)
I agree with the above plan 

## 2017-02-22 ENCOUNTER — Ambulatory Visit (INDEPENDENT_AMBULATORY_CARE_PROVIDER_SITE_OTHER): Payer: No Typology Code available for payment source | Admitting: Licensed Clinical Social Worker

## 2017-02-22 DIAGNOSIS — F3341 Major depressive disorder, recurrent, in partial remission: Secondary | ICD-10-CM | POA: Diagnosis not present

## 2017-03-18 ENCOUNTER — Other Ambulatory Visit: Payer: Self-pay | Admitting: Internal Medicine

## 2017-03-18 ENCOUNTER — Other Ambulatory Visit: Payer: Self-pay | Admitting: Family

## 2017-03-21 ENCOUNTER — Telehealth: Payer: Self-pay | Admitting: Internal Medicine

## 2017-03-21 MED ORDER — PRAMIPEXOLE DIHYDROCHLORIDE 0.5 MG PO TABS: 0.5000 mg | ORAL_TABLET | Freq: Every evening | ORAL | 1 refills | Status: DC | PRN

## 2017-03-21 NOTE — Telephone Encounter (Signed)
She may need an appointment first, Please advise.

## 2017-03-21 NOTE — Telephone Encounter (Signed)
Rx refill request for provider review:  Mirepex 0.5  Last filled 10/28/16   LOV: 12/28/16 ( acute)  Pharmacy: verified

## 2017-03-21 NOTE — Telephone Encounter (Signed)
Copied from [place]. Topic: Quick Communication - Rx Refill/Question >> Mar 21, 2017  1:04 PM Robina Ade, Helene Kelp D wrote: Medication: pramipexole (MIRAPEX) 0.5 MG tablet   Has the patient contacted their pharmacy? Yes   (Agent: If no, request that the patient contact the pharmacy for the refill.)   Preferred Pharmacy (with phone number or street name): [place], [place]   Agent: Please be advised that RX refills may take up to 3 business days. We ask that you follow-up with your pharmacy.

## 2017-03-21 NOTE — Telephone Encounter (Signed)
Refill sent in

## 2017-03-21 NOTE — Telephone Encounter (Signed)
Med was rx by Dr. Raeford Razor pls advise if ok to refill.Marland KitchenJohny Chess

## 2017-03-22 NOTE — Telephone Encounter (Signed)
Notified pt MD sent rx to [facility] 

## 2017-03-23 ENCOUNTER — Other Ambulatory Visit: Payer: Self-pay | Admitting: Internal Medicine

## 2017-03-30 ENCOUNTER — Ambulatory Visit (INDEPENDENT_AMBULATORY_CARE_PROVIDER_SITE_OTHER): Payer: No Typology Code available for payment source | Admitting: Licensed Clinical Social Worker

## 2017-03-30 DIAGNOSIS — F4323 Adjustment disorder with mixed anxiety and depressed mood: Secondary | ICD-10-CM | POA: Diagnosis not present

## 2017-04-01 ENCOUNTER — Encounter: Payer: Self-pay | Admitting: Internal Medicine

## 2017-04-01 ENCOUNTER — Ambulatory Visit: Payer: Medicare Other | Admitting: Internal Medicine

## 2017-04-01 ENCOUNTER — Other Ambulatory Visit: Payer: Self-pay

## 2017-04-01 ENCOUNTER — Telehealth: Payer: Self-pay | Admitting: Internal Medicine

## 2017-04-01 DIAGNOSIS — F332 Major depressive disorder, recurrent severe without psychotic features: Secondary | ICD-10-CM

## 2017-04-01 DIAGNOSIS — F4310 Post-traumatic stress disorder, unspecified: Secondary | ICD-10-CM

## 2017-04-01 MED ORDER — TEMAZEPAM 15 MG PO CAPS: 15.0000 mg | ORAL_CAPSULE | Freq: Every evening | ORAL | 0 refills | Status: DC | PRN

## 2017-04-01 MED ORDER — VILAZODONE HCL 20 MG PO TABS: 20.0000 mg | ORAL_TABLET | Freq: Every day | ORAL | 3 refills | Status: DC

## 2017-04-01 NOTE — Telephone Encounter (Signed)
Sent!

## 2017-04-01 NOTE — Patient Instructions (Signed)
We will have you stop taking lexapro.   We have sent in viibryd (vilazodone) 1 pill daily for 2 weeks then let us know if it is working. If it is not we can increase the dose.   It is okay to take 2 of the temazepam pills at night time for sleeping.
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
## 2017-04-01 NOTE — Telephone Encounter (Signed)
Copied from [place] [id number]. Topic: Quick Communication - Rx Refill/Question >> Apr 01, 2017  2:18 PM [name] wrote:  Medication: Vilazodone HCl (VIIBRYD) 20 MG TABS  [facility] did not have this medicine   Has the patient contacted their pharmacy? Yes.     Preferred Pharmacy (with phone number or street name): [facility] [address], [place] [address] [place] [address] Phone: [phone] Fax: [phone]     Agent: Please be advised that RX refills may take up to 3 business days. We ask that you follow-up with your pharmacy.

## 2017-04-01 NOTE — Telephone Encounter (Signed)
Called to the office to speak with Tammy to inform that the prescription was sent to the wrong pharmacy, medication will be sent to [facility].

## 2017-04-01 NOTE — Assessment & Plan Note (Signed)
Severe exacerbation with her recent trauma. She is taking temazepam for sleep and will temporarily allow 2 tablets at night time to help her sleep. Will change lexapro to viibryd as she has not gotten relief and symptoms have worsened on lexapro. She has been on and failed many agents due to no efficacy in the past including cymbalta, zoloft, remeron. She does not think she is currently taking remeron but she is not sure. We have asked her to check and call us back. Needs close follow up. Continue counseling intense.
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
## 2017-04-04 ENCOUNTER — Ambulatory Visit (INDEPENDENT_AMBULATORY_CARE_PROVIDER_SITE_OTHER): Payer: No Typology Code available for payment source | Admitting: Licensed Clinical Social Worker

## 2017-04-04 DIAGNOSIS — F4323 Adjustment disorder with mixed anxiety and depressed mood: Secondary | ICD-10-CM | POA: Diagnosis not present

## 2017-04-08 ENCOUNTER — Telehealth: Payer: Self-pay | Admitting: Internal Medicine

## 2017-04-08 NOTE — Telephone Encounter (Signed)
Viibryd was too expensive patient would like something else sent in to [facility]

## 2017-04-08 NOTE — Telephone Encounter (Signed)
Copied from [place] [id number]. Topic: Quick Communication - Rx Refill/Question >> Apr 01, 2017  2:18 PM [name] wrote: Medication: Vilazodone HCl (VIIBRYD) 20 MG TABS  [facility] did not have this medicine   Has the patient contacted their pharmacy? Yes.     Preferred Pharmacy (with phone number or street name): [facility] [address] [place] [address] [place] [address] Phone: [phone] Fax: [phone]     Agent: Please be advised that RX refills may take up to 3 business days. We ask that you follow-up with your pharmacy. >> Apr 08, 2017 10:02 AM [name], RMA wrote: Pt called and stated that the medication was too expensive and she would like something else sent to the pharmacy

## 2017-04-08 NOTE — Telephone Encounter (Signed)
Will do a PA.

## 2017-04-08 NOTE — Telephone Encounter (Signed)
Did it need PA? There are not a lot of good options for her given all the medicines she has tried and failed.

## 2017-04-11 NOTE — Telephone Encounter (Signed)
PA started on CoverMyMeds KEY: [id number]

## 2017-04-14 ENCOUNTER — Telehealth: Payer: Self-pay

## 2017-04-14 NOTE — Telephone Encounter (Signed)
PA started on CoverMyMeds KEY: [id number]

## 2017-04-15 ENCOUNTER — Telehealth: Payer: Self-pay | Admitting: Internal Medicine

## 2017-04-15 NOTE — Telephone Encounter (Signed)
Medication too expensive, needs to meet her deductible. Would like another medication until she can cover this expense.  Pharmacy: [place], [place] [phone] (Phone) [phone] (Fax)

## 2017-04-15 NOTE — Telephone Encounter (Signed)
Copied from [place] [id number]. Topic: Quick Communication - Rx Refill/Question >> Apr 15, 2017  4:32 PM Arletha Grippe wrote: Medication: Vilazodone HCl (VIIBRYD) 20 MG TABS   Has the patient contacted their pharmacy? Yes.     (Agent: If no, request that the patient contact the pharmacy for the refill.)   Preferred Pharmacy (with phone number [facility] street name): [facility] [address]  Pt says that even with approval, this med is too expensive due deductible.   Pt asking for something different to be called in until she can get the money to cover this medication.       Agent: Please be advised that RX refills may take up to 3 business days. We ask that you follow-up with your pharmacy.

## 2017-04-18 NOTE — Telephone Encounter (Signed)
Called patient back and informed of MD response. Told patient that I will fill out the savings card online for them and if there is a print version I will do that, but will also provide a GoodRx card to try. Patient will pick up 04/19/2017

## 2017-04-18 NOTE — Telephone Encounter (Signed)
PA approved through 04/15/2018

## 2017-04-18 NOTE — Telephone Encounter (Signed)
She can go on viibryd website and download a coupon card which should help with the co-pay and deductible. There are not other good options for her as she has tried and failed a lot of other medicines for depression. She should try viibyrd if able.

## 2017-04-18 NOTE — Telephone Encounter (Signed)
Called patient to see if they talked to there insurance or pharmacy to see any cheaper option. Patient states that she just has to meet deductible but cant afford the viibryd because it is $275.75. Was wanting something cheaper till she can afford the viibryd.

## 2017-04-20 ENCOUNTER — Other Ambulatory Visit: Payer: Self-pay | Admitting: Family Medicine

## 2017-04-20 NOTE — Telephone Encounter (Signed)
Patient states she used the card and RX is still $282. She would like to know can she be switched back to Lexapro.  CB# [phone]

## 2017-04-21 ENCOUNTER — Telehealth: Payer: Self-pay | Admitting: Internal Medicine

## 2017-04-21 NOTE — Telephone Encounter (Signed)
Copied from [place] [id number]. Topic: Quick Communication - Rx Refill/Question >> Apr 15, 2017  4:32 PM Arletha Grippe wrote: Medication: Vilazodone HCl (VIIBRYD) 20 MG TABS   Has the patient contacted their pharmacy? Yes.     (Agent: If no, request that the patient contact the pharmacy for the refill.)   Preferred Pharm[facility] (with phone number [facility] [address]  Pt says that even with approval, this med is too expensive due deductible.   Pt asking for something different to be called in until she can get the money to cover this medication.       Agent: Please be advised that RX refills may take up to 3 business days. We ask that you follow-up with your pharmacy. >> Apr 21, 2017 12:46 PM Cleaster Corin, NT wrote: Pt calling back states she used the card and RX is still $282. She would like to know can she be switched back to Lexapro.  CB# [phone]

## 2017-04-22 MED ORDER — ESCITALOPRAM OXALATE 20 MG PO TABS: 20.0000 mg | ORAL_TABLET | Freq: Every day | ORAL | 1 refills | Status: DC

## 2017-04-22 NOTE — Telephone Encounter (Signed)
Notified pt w/Dr. Ronnald Ramp response rx sent to [name]

## 2017-04-22 NOTE — Telephone Encounter (Signed)
MD is out of the office pls advise on msg below../lmb 

## 2017-04-22 NOTE — Addendum Note (Signed)
Addended by: Earnstine Regal on: 04/22/2017 01:52 PM   Modules accepted: Orders

## 2017-04-22 NOTE — Telephone Encounter (Signed)
Ok to ref Lexapro x30 d w/1 ref F/u w/Dr Sharlet Salina Thx

## 2017-04-22 NOTE — Telephone Encounter (Addendum)
Checking status, patient is out of meds and has been for 2 weeks. Could escitalopram (LEXAPRO) 20 MG tablet be called in instead?Requesting call back, [phone]

## 2017-04-25 NOTE — Telephone Encounter (Signed)
That person is a therapist not a doctor. I would like her to see a mental health doctor as she has been on many medicines which have not worked in the past.

## 2017-04-25 NOTE — Telephone Encounter (Signed)
The lexapro did not help her so switching back to it will not help. Would she be willing to see a psychiatrist to see what other options there are?

## 2017-04-25 NOTE — Telephone Encounter (Signed)
Called pt w/Md response. Pt states she already see a psychiatrist [name]. She states she picked up the Lexapro yesterday, and started. She states she have an appt tomorrow @ 10 :15 will discuss w/MD at that time.Marland KitchenJohny Chess

## 2017-04-25 NOTE — Telephone Encounter (Signed)
Pt has appt w/you tomorrow she states she will discuss then.Marland KitchenJohny Chess

## 2017-04-26 ENCOUNTER — Encounter: Payer: Self-pay | Admitting: Internal Medicine

## 2017-04-26 ENCOUNTER — Ambulatory Visit: Payer: Medicare Other | Admitting: Internal Medicine

## 2017-04-26 DIAGNOSIS — F332 Major depressive disorder, recurrent severe without psychotic features: Secondary | ICD-10-CM | POA: Diagnosis not present

## 2017-04-26 DIAGNOSIS — F4310 Post-traumatic stress disorder, unspecified: Secondary | ICD-10-CM | POA: Diagnosis not present

## 2017-04-26 MED ORDER — PRAMIPEXOLE DIHYDROCHLORIDE 0.5 MG PO TABS: 0.5000 mg | ORAL_TABLET | Freq: Every evening | ORAL | 1 refills | Status: DC | PRN
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
## 2017-04-26 NOTE — Assessment & Plan Note (Signed)
She continues with therapy and is taking lexapro. She is still having moderate symptoms of active ptsd with night time awakenings and hypervigilence.

## 2017-04-26 NOTE — Assessment & Plan Note (Signed)
She is currently taking lexapro due to cost of viibryd although viibryd did help her symptoms. She has tried and failed wellbutrin, zoloft, lexapro, remeron, prozac, cymbalta.

## 2017-04-26 NOTE — Patient Instructions (Signed)
We will have you keep taking the lexapro every day.   We have refilled the mirapex.

## 2017-04-28 ENCOUNTER — Telehealth: Payer: Self-pay | Admitting: Internal Medicine

## 2017-04-28 NOTE — Telephone Encounter (Signed)
She should not be taking remeron and lexapro at the same time.

## 2017-04-28 NOTE — Telephone Encounter (Signed)
Copied from [place]. Topic: Quick Communication - See Telephone Encounter >> Apr 28, 2017  2:46 PM Corie Chiquito, [place] wrote: CRM for notification. Oswaldo Milian is calling on behalf of the patient because she still needs her Remeron 15 mg medication filled. Stated that they never received the request for the medicatioN. Patient will be using the [facility] [phone]. Patient is at the pharmacy now waiting for her medication Pharmacy is a 30 min drive from her

## 2017-04-29 ENCOUNTER — Other Ambulatory Visit: Payer: Self-pay | Admitting: Internal Medicine

## 2017-04-29 NOTE — Telephone Encounter (Signed)
Called pt no answer LMOM w/MD response../lm,b 

## 2017-04-29 NOTE — Telephone Encounter (Signed)
Control database checked last refill: 04/01/2017 LOV: 04/26/2017

## 2017-05-05 ENCOUNTER — Ambulatory Visit (INDEPENDENT_AMBULATORY_CARE_PROVIDER_SITE_OTHER): Payer: No Typology Code available for payment source | Admitting: Licensed Clinical Social Worker

## 2017-05-05 DIAGNOSIS — F3341 Major depressive disorder, recurrent, in partial remission: Secondary | ICD-10-CM

## 2017-05-11 ENCOUNTER — Ambulatory Visit: Payer: No Typology Code available for payment source | Admitting: Licensed Clinical Social Worker

## 2017-05-19 ENCOUNTER — Ambulatory Visit: Payer: No Typology Code available for payment source | Admitting: Licensed Clinical Social Worker

## 2017-05-25 ENCOUNTER — Ambulatory Visit: Payer: No Typology Code available for payment source | Admitting: Licensed Clinical Social Worker

## 2017-05-31 ENCOUNTER — Ambulatory Visit (INDEPENDENT_AMBULATORY_CARE_PROVIDER_SITE_OTHER): Payer: No Typology Code available for payment source | Admitting: Licensed Clinical Social Worker

## 2017-05-31 DIAGNOSIS — F3341 Major depressive disorder, recurrent, in partial remission: Secondary | ICD-10-CM

## 2017-06-20 ENCOUNTER — Telehealth: Payer: Self-pay | Admitting: Internal Medicine

## 2017-06-20 NOTE — Telephone Encounter (Signed)
Copied from [place] [id number]. Topic: Quick Communication - Rx Refill/Question >> Jun 20, 2017  2:16 PM Wynetta Emery, [place] C wrote:   Medication: atorvastatin (LIPITOR) 80 MG tablet  Has the patient contacted their pharmacy? No   (Agent: If no, request that the patient contact the pharmacy for the refill.) (Agent: If yes, when and what did the pharmacy advise?)  Preferred Pharmacy (with phone number or street name): [facility], [place]. [phone] (Phone) [phone] (Fax)      Agent: Please be advised that RX refills may take up to 3 business days. We ask that you follow-up with your pharmacy.

## 2017-06-21 ENCOUNTER — Other Ambulatory Visit: Payer: Self-pay | Admitting: *Deleted

## 2017-06-21 MED ORDER — ATORVASTATIN CALCIUM 80 MG PO TABS: 80.0000 mg | ORAL_TABLET | Freq: Every day | ORAL | 0 refills | Status: DC

## 2017-06-22 ENCOUNTER — Other Ambulatory Visit: Payer: Self-pay | Admitting: Internal Medicine

## 2017-06-22 MED ORDER — ESCITALOPRAM OXALATE 20 MG PO TABS: 20.0000 mg | ORAL_TABLET | Freq: Every day | ORAL | 2 refills | Status: DC

## 2017-06-22 NOTE — Telephone Encounter (Signed)
Copied from [place] [id number]. Topic: Quick Communication - Rx Refill/Question >> Jun 22, 2017  2:15 PM Lennox Solders wrote: Medication: generic lexapro and baclofen Has the patient contacted their pharmacy? Yes (Agent: If no, request that the patient contact the pharmacy for the refill.) (Agent: If yes, when and what did the pharmacy advise?) pharm told pt to call her doctor  [facility] [address] Agent: Please be advised that RX refills may take up to 3 business days. We ask that you follow-up with your pharmacy.

## 2017-06-22 NOTE — Telephone Encounter (Signed)
Refill of Lexapro sent to pharmacy as requested.   Pt requesting refill of Baclofen, last filled on 02/02/17 #90 with 2 refills.   LOV: 04/26/17 Dr. Teryl Lucy on [address]

## 2017-06-23 MED ORDER — BACLOFEN 20 MG PO TABS: ORAL_TABLET | ORAL | 2 refills | Status: DC

## 2017-07-07 ENCOUNTER — Other Ambulatory Visit: Payer: Self-pay | Admitting: Internal Medicine

## 2017-07-07 MED ORDER — TRIAMTERENE-HCTZ 37.5-25 MG PO TABS: ORAL_TABLET | ORAL | 0 refills | Status: DC

## 2017-07-07 NOTE — Telephone Encounter (Signed)
Copied from [place] [id number]. Topic: Quick Communication - Rx Refill/Question >> Jul 07, 2017 12:49 PM Synthia Innocent wrote: Medication: triamterene-hydrochlorothiazide (MAXZIDE-25) 37.5-25 MG tablet  Has the patient contacted their pharmacy? Yes.  New pharmacy (Agent: If no, request that the patient contact the pharmacy for the refill.) (Agent: If yes, when and what did the pharmacy advise?)  Preferred Pharm[facility] ([facility] phone number [facility] [address]  Agent: Please be advised that RX refills may take up to 3 business days. We ask that you follow-up with your pharmacy.

## 2017-07-25 ENCOUNTER — Other Ambulatory Visit: Payer: Self-pay | Admitting: Internal Medicine

## 2017-07-25 NOTE — Telephone Encounter (Signed)
Copied from [place] [id number]. Topic: Quick Communication - Rx Refill/Question >> Jul 25, 2017  1:56 PM [name] wrote: Medication:  temazepam (RESTORIL) 15 MG capsule  Has the patient contacted their pharmacy? Yes.   Was told to call dr. Gabriel Carina  (Agent: If no, request that the patient contact the pharmacy for the refill.) (Agent: If yes, when and what did the pharmacy advise?)  Preferred Pharmacy (with phone number or street name):  [facility] [place], [address] [place]. [place] [address] Phone: [phone] Fax: [phone]    Agent: Please be advised that RX refills may take up to 3 business days. We ask that you follow-up with your pharmacy.

## 2017-07-26 MED ORDER — TEMAZEPAM 15 MG PO CAPS: ORAL_CAPSULE | ORAL | 3 refills | Status: DC

## 2017-08-30 ENCOUNTER — Telehealth: Payer: Self-pay | Admitting: Internal Medicine

## 2017-08-30 NOTE — Telephone Encounter (Signed)
Copied from [place] [id number]. Topic: Quick Communication - See Telephone Encounter >> Aug 30, 2017 11:08 AM Hewitt Shorts wrote: Pt is needing a refill on pramiperxole   [facility]   Best number [phone]

## 2017-08-30 NOTE — Telephone Encounter (Signed)
Left message for pt to contact [facility] [address] to retrieve prescription previously sent to [facility]  on [address].

## 2017-09-23 ENCOUNTER — Other Ambulatory Visit: Payer: Self-pay | Admitting: Internal Medicine

## 2017-10-11 ENCOUNTER — Other Ambulatory Visit: Payer: Self-pay | Admitting: Internal Medicine

## 2017-10-17 ENCOUNTER — Ambulatory Visit: Payer: Medicare Other | Admitting: Internal Medicine

## 2017-10-17 ENCOUNTER — Encounter: Payer: Self-pay | Admitting: Internal Medicine

## 2017-10-17 ENCOUNTER — Ambulatory Visit: Payer: Self-pay | Admitting: *Deleted

## 2017-10-17 VITALS — BP 110/72 | HR 60 | Temp 98.3°F | Ht 62.0 in | Wt 137.0 lb

## 2017-10-17 DIAGNOSIS — Z23 Encounter for immunization: Secondary | ICD-10-CM

## 2017-10-17 DIAGNOSIS — G3281 Cerebellar ataxia in diseases classified elsewhere: Secondary | ICD-10-CM

## 2017-10-17 DIAGNOSIS — R42 Dizziness and giddiness: Secondary | ICD-10-CM | POA: Diagnosis not present

## 2017-10-17 NOTE — Patient Instructions (Signed)
We have given you the flu shot and will get MRI tomorrow morning.   We would like you to start taking zyrtec daily to see if this helps the sinuses.

## 2017-10-17 NOTE — Telephone Encounter (Signed)
Noted  

## 2017-10-17 NOTE — Telephone Encounter (Signed)
Patient is experiencing feeling foggy headed with some off balance and fell over the weekend twice when standing up.Reports she did not hit her head with the 2 falls.  She denies vertigo/fever. Reports she has not been sick recently but did have ear pain 2 nights ago. Reports the foggy head came on early last week after missing several doses of her regular medication due to being out of town last weekend and left her medications at home. She started back on medications Sunday evening last week then the foggy, off balance began and has continued. Denies N/V/CP but has increased SOB with exertion over the last 2 weeks. Also reports she has felt fatigued during this time. 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 Appointment made with PCP this afternoon. Reason for Disposition . [1] Dizziness caused by heat exposure, sudden standing, or poor fluid intake AND [2] no improvement after 2 hours of rest and fluids  Answer Assessment - Initial Assessment Questions 1. DESCRIPTION: "Describe your dizziness."     Woozy 2. LIGHTHEADED: "Do you feel lightheaded?" (e.g., somewhat faint, woozy, weak upon standing)     Upon standing  3. VERTIGO: "Do you feel like either you or the room is spinning or tilting?" (i.e. vertigo)     no 4. SEVERITY: "How bad is it?"  "Do you feel like you are going to faint?" "Can you stand and walk?"   - MILD - walking normally   - MODERATE - interferes with normal activities (e.g., work, school)    - SEVERE - unable to stand, requires support to walk, feels like passing out now.      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  5. ONSET:  "When did the dizziness begin?"     Over the week last week after missing several doses her her regular medicine last weekend while being out of town. She began taking it again on Sunday evening.  6. AGGRAVATING FACTORS: "Does anything make it worse?" (e.g., standing, change in head position)  Standing. 7. HEART RATE: "Can you tell me your heart rate?" "How many beats in 15 seconds?"  (Note: not all patients can do this)       no 8. CAUSE: "What do you think is causing the dizziness?"     Unsure. 9. RECURRENT SYMPTOM: "Have you had dizziness before?" If so, ask: "When was the last time?" "What happened that time?"     A few years ago with an ear infection. 10. OTHER SYMPTOMS: "Do you have any other symptoms?" (e.g., fever, chest pain, vomiting, diarrhea, bleeding)       Right ear pain 2 days ago. Increased watery stools over the weekend.  11. PREGNANCY: "Is there any chance you are pregnant?" "When was your last menstrual period?"       no  Protocols used: DIZZINESS [facility]

## 2017-10-17 NOTE — Assessment & Plan Note (Signed)
Given history and past stroke it is concerning for stroke. She is having cerebellar symptoms and some new numbness. Ordered stat MRI brain as she is >48 hours out of first onset of symptoms. Will advise her to start zyrtec as well.

## 2017-10-17 NOTE — Progress Notes (Signed)
   Subjective:    Patient ID: Anna Lyons, female    DOB: 10-26-1955, 62 y.o.   MRN: [id number]  HPI The patient is a 62 YO female coming in for feeling dizzy the last 9-10 days. She has stroke in the past. She was going out of town and ran out of her medications for several days which was when the symptoms started. Started with dizziness and feeling like her balance was off. She has fallen 2-3 times in the last week. She denies LOC or syncope. She is hitting into things. She is also feeling very tired. She denies chest pains or SOB or cough. She has mild ear pain left ear. Denies nose running or sore throat. She denies leg or arm weakness. Last night and this morning she had numbness and tingling in her right arm. She has been slurring her speech more in the last week or so and her daughter has noticed and been concerned. 8 days ago was last normal. The dizziness is worsening over the week and not improving. She is now taking her medications again regularly for the last week. She is still smoking. She states that the arm changes reminded her about when she had her last stroke.   PMH, [facility], social history reviewed and updated.   Review of Systems  Constitutional: Positive for activity change and fatigue.  HENT: Positive for congestion and ear pain. Negative for postnasal drip, rhinorrhea, sinus pressure, sinus pain, sore throat and trouble swallowing.   Eyes: Negative.   Respiratory: Negative for cough, chest tightness and shortness of breath.   Cardiovascular: Negative for chest pain, palpitations and leg swelling.  Gastrointestinal: Negative for abdominal distention, abdominal pain, constipation, diarrhea, nausea and vomiting.  Musculoskeletal: Negative.   Skin: Negative.   Neurological: Positive for dizziness, speech difficulty and numbness. Negative for tremors, seizures, syncope, facial asymmetry, weakness, light-headedness and headaches.  Psychiatric/Behavioral: Negative.       Objective:     Physical Exam  Constitutional: She is oriented to person, place, and time. She appears well-developed and well-nourished.  HENT:  Head: Normocephalic and atraumatic.  Right Ear: External ear normal.  Left Ear: External ear normal.  Mild oropharynx erythema, nose without swelling, ears without TM changes or bulging.   Eyes: Pupils are equal, round, and reactive to light.  Eyes sluggish moving to right and downward on exam  Neck: Normal range of motion.  Cardiovascular: Normal rate and regular rhythm.  Pulmonary/Chest: Effort normal and breath sounds normal. No respiratory distress. She has no wheezes. She has no rales.  Abdominal: Soft. Bowel sounds are normal. She exhibits no distension. There is no tenderness. There is no rebound.  Musculoskeletal: She exhibits no edema.  Neurological: She is alert and oriented to person, place, and time. A cranial nerve deficit is present. Coordination abnormal.  Some hesitancy in the gait, some change in sensation in the right hand, left arm normal. CN 2-12 intact although eye ROM some sluggish. Speech is slower than usual and some slurring. No facial drooping noted.   Skin: Skin is warm and dry.  Psychiatric: She has a normal mood and affect.   Vitals:   10/17/17 1259  BP: 110/72  Pulse: 60  Temp: 98.3 F (36.8 C)  TempSrc: Oral  SpO2: 93%  Weight: 137 lb (62.1 kg)  Height: 5\' 2"  (1.575 m)      Assessment & Plan:  Flu shot given at visit

## 2017-10-18 ENCOUNTER — Ambulatory Visit
Admission: RE | Admit: 2017-10-18 | Discharge: 2017-10-18 | Disposition: A | Discharge status: Home / Self Care | Payer: Medicare Other | Source: Ambulatory Visit | Attending: Internal Medicine | Admitting: Internal Medicine

## 2017-10-18 DIAGNOSIS — G3281 Cerebellar ataxia in diseases classified elsewhere: Secondary | ICD-10-CM

## 2017-11-10 ENCOUNTER — Other Ambulatory Visit: Payer: Self-pay | Admitting: Internal Medicine

## 2017-11-24 ENCOUNTER — Other Ambulatory Visit: Payer: Self-pay | Admitting: Internal Medicine

## 2017-11-24 NOTE — Telephone Encounter (Signed)
Copied from [place] [id number]. Topic: Quick Communication - Rx Refill/Question >> Nov 24, 2017  3:14 PM Blase Mess A wrote: Medication: temazepam (RESTORIL) 15 MG capsule [[id number]]   Has the patient contacted their pharmacy? {Yes  (Agent: If no, request that the patient contact the pharmacy for the refill.) (Agent: If yes, when and what did the pharmacy advise?)  Preferred Pharmacy (with phone number or street name): [facility], [address]. [place]. [place] [address] Phone: [phone] Fax: [phone] Not a 24 hour pharmacy; exact hours not known.    Agent: Please be advised that RX refills may take up to 3 business days. We ask that you follow-up with your pharmacy.

## 2017-11-25 ENCOUNTER — Other Ambulatory Visit: Payer: Self-pay

## 2017-11-25 MED ORDER — LEVETIRACETAM 500 MG PO TABS: 500.0000 mg | ORAL_TABLET | Freq: Two times a day (BID) | ORAL | 3 refills | Status: DC

## 2017-11-25 MED ORDER — TEMAZEPAM 15 MG PO CAPS: ORAL_CAPSULE | ORAL | 3 refills | Status: DC

## 2017-11-29 ENCOUNTER — Telehealth: Payer: Self-pay | Admitting: Internal Medicine

## 2017-11-29 NOTE — Telephone Encounter (Signed)
Temazepam on back order not sure when getting in, requesting a change

## 2017-11-29 NOTE — Telephone Encounter (Signed)
Her [facility] [address].

## 2017-11-29 NOTE — Telephone Encounter (Signed)
Patient informed of MD response and stated understanding  

## 2017-11-29 NOTE — Telephone Encounter (Signed)
[facility] [address] [facility] 61 capsule in stock

## 2017-11-29 NOTE — Telephone Encounter (Signed)
Pt states she was told by pharmacy that the Rx for temazepam (RESTORIL) 15 MG capsule could not be transferred because it is a controlled substance. Pt requests that the Rx for temazepam (RESTORIL) 15 MG capsule be sent to [facility], [address] [place]  [phone] (Phone)  [phone] (Fax)

## 2017-11-29 NOTE — Telephone Encounter (Signed)
Copied from [place] [id number]. Topic: Quick Communication - Rx Refill/Question >> Nov 29, 2017  9:54 AM Burchel, Abbi R wrote: Medication: temazepam (RESTORIL) 15 MG capsule   Pt states she has contacted her pharmacy multiple times and has been told they are "waiting to hear from the [place] office" Pt was advised of E-Prescribing Status: (Receipt confirmed by pharmacy  11/25/2017  7:54 AM EDT), but has asked that the nurse reach out to the pharmacy to find out why they will not release her medication to her.  Please call pt if/when any resolution is reached.

## 2017-11-29 NOTE — Telephone Encounter (Signed)
She should call local pharmacies to see if this is in stock elsewhere.

## 2017-11-30 MED ORDER — TEMAZEPAM 15 MG PO CAPS: ORAL_CAPSULE | ORAL | 0 refills | Status: DC

## 2017-11-30 NOTE — Addendum Note (Signed)
Addended by: Pricilla Holm A on: 11/30/2017 08:25 AM   Modules accepted: Orders

## 2017-11-30 NOTE — Telephone Encounter (Signed)
Sent in

## 2018-02-02 LAB — HM DEXA SCAN: HM Dexa Scan: -1.3

## 2018-02-13 ENCOUNTER — Other Ambulatory Visit (INDEPENDENT_AMBULATORY_CARE_PROVIDER_SITE_OTHER): Payer: Medicare Other

## 2018-02-13 ENCOUNTER — Encounter: Payer: Self-pay | Admitting: Internal Medicine

## 2018-02-13 ENCOUNTER — Ambulatory Visit: Payer: Medicare Other | Admitting: Internal Medicine

## 2018-02-13 ENCOUNTER — Ambulatory Visit (INDEPENDENT_AMBULATORY_CARE_PROVIDER_SITE_OTHER)
Admission: RE | Admit: 2018-02-13 | Discharge: 2018-02-13 | Disposition: A | Discharge status: Home / Self Care | Payer: Medicare Other | Source: Ambulatory Visit | Attending: Internal Medicine | Admitting: Internal Medicine

## 2018-02-13 VITALS — BP 122/70 | HR 64 | Temp 98.0°F | Resp 16 | Ht 62.0 in | Wt 135.0 lb

## 2018-02-13 DIAGNOSIS — R059 Cough, unspecified: Secondary | ICD-10-CM

## 2018-02-13 DIAGNOSIS — R05 Cough: Secondary | ICD-10-CM

## 2018-02-13 DIAGNOSIS — R112 Nausea with vomiting, unspecified: Secondary | ICD-10-CM

## 2018-02-13 DIAGNOSIS — R10816 Epigastric abdominal tenderness: Secondary | ICD-10-CM | POA: Insufficient documentation

## 2018-02-13 LAB — AMYLASE: Amylase: 37 U/L (ref 27–131)

## 2018-02-13 LAB — COMPREHENSIVE METABOLIC PANEL
ALT: 14 U/L (ref 0–35)
AST: 21 U/L (ref 0–37)
Albumin: 4.1 g/dL (ref 3.5–5.2)
Alkaline Phosphatase: 77 U/L (ref 39–117)
BUN: 9 mg/dL (ref 6–23)
CALCIUM: 10.2 mg/dL (ref 8.4–10.5)
CO2: 32 mEq/L (ref 19–32)
Chloride: 97 mEq/L (ref 96–112)
Creatinine, Ser: 0.72 mg/dL (ref 0.40–1.20)
GFR: 86.67 mL/min (ref >60.00)
Glucose, Bld: 91 mg/dL (ref 70–99)
Potassium: 3.9 mEq/L (ref 3.5–5.1)
Sodium: 136 mEq/L (ref 135–145)
Total Bilirubin: 1 mg/dL (ref 0.2–1.2)
Total Protein: 7.4 g/dL (ref 6.0–8.3)

## 2018-02-13 LAB — CBC WITH DIFFERENTIAL/PLATELET
Basophils Absolute: 0 10*3/uL (ref 0.0–0.1)
Basophils Relative: 0.8 % (ref 0.0–3.0)
Eosinophils Absolute: 0 10*3/uL (ref 0.0–0.7)
Eosinophils Relative: 0.6 % (ref 0.0–5.0)
HCT: 41.2 % (ref 36.0–46.0)
Hemoglobin: 14.2 g/dL (ref 12.0–15.0)
LYMPHS PCT: 33.4 % (ref 12.0–46.0)
Lymphs Abs: 1.6 10*3/uL (ref 0.7–4.0)
MCHC: 34.4 g/dL (ref 30.0–36.0)
MCV: 97.2 fl (ref 78.0–100.0)
MONOS PCT: 8.3 % (ref 3.0–12.0)
Monocytes Absolute: 0.4 10*3/uL (ref 0.1–1.0)
Neutro Abs: 2.7 10*3/uL (ref 1.4–7.7)
Neutrophils Relative %: 56.9 % (ref 43.0–77.0)
Platelets: 184 10*3/uL (ref 150.0–400.0)
RBC: 4.25 Mil/uL (ref 3.87–5.11)
RDW: 12.8 % (ref 11.5–15.5)
WBC: 4.7 10*3/uL (ref 4.0–10.5)

## 2018-02-13 LAB — LIPASE: Lipase: 20 U/L (ref 11.0–59.0)

## 2018-02-13 MED ORDER — ONDANSETRON 8 MG PO TBDP: 8.0000 mg | ORAL_TABLET | Freq: Three times a day (TID) | ORAL | 0 refills | Status: DC | PRN

## 2018-02-13 NOTE — Progress Notes (Signed)
Subjective:  Patient ID: Anna Lyons, female    DOB: 06/26/1955  Age: 63 y.o. MRN: [id number]  CC: Cough and Abdominal Pain   HPI Anna Lyons presents for a 2-day history of nausea and vomiting after eating garlic laden shrimp.  She also has a chronic, nonproductive cough.  She is keeping down liquids without difficulty.  Outpatient Medications Prior to Visit  Medication Sig Dispense Refill  . albuterol (PROVENTIL HFA;VENTOLIN HFA) 108 (90 Base) MCG/ACT inhaler Inhale 2 puffs into the lungs every 6 (six) hours as needed for wheezing or shortness of breath. 1 Inhaler 0  . alosetron (LOTRONEX) 0.5 MG tablet Take 1 tablet (0.5 mg total) by mouth 2 (two) times daily as needed. cramping 20 tablet 0  . aspirin EC 325 MG tablet Take 325 mg by mouth daily.    [name] atorvastatin (LIPITOR) 80 MG tablet TAKE 1 TABLET BY MOUTH ONCE DAILY 90 tablet 1  . baclofen (LIORESAL) 20 MG tablet TAKE 1/2  TABLET BY MOUTH THREE TIMES DAILY FOR  1  WEEK  THEN  INCREASE  TO  ONE  TABLET  3  TIMES  DAILY 90 tablet 2  . diclofenac sodium (VOLTAREN) 1 % GEL Apply 2 g topically 4 (four) times daily. 100 g 3  . escitalopram (LEXAPRO) 20 MG tablet TAKE 1 TABLET BY MOUTH ONCE DAILY 90 tablet 1  . ibuprofen (ADVIL,MOTRIN) 800 MG tablet Take 1 tablet by mouth every 8 (eight) hours as needed for moderate pain.   1  . levETIRAcetam (KEPPRA) 500 MG tablet Take 1 tablet (500 mg total) by mouth 2 (two) times daily. 180 tablet 3  . levocetirizine (XYZAL) 5 MG tablet TAKE 1 TABLET BY MOUTH ONCE DAILY IN THE EVENING 30 tablet 3  . Multiple Vitamin (MULTIVITAMIN) capsule Take 1 capsule by mouth daily.     . ondansetron (ZOFRAN) 4 MG tablet Take 1 tablet (4 mg total) by mouth every 8 (eight) hours as needed for nausea or vomiting. 10 tablet 0  . pramipexole (MIRAPEX) 0.5 MG tablet Take 1 tablet (0.5 mg total) by mouth at bedtime as needed. 90 tablet 1  . ranitidine (ZANTAC) 300 MG capsule Take 1 capsule (300 mg total) by mouth every  evening. 30 capsule 5  . temazepam (RESTORIL) 15 MG capsule TAKE 1 TO 2 CAPSULES BY MOUTH AT BEDTIME AS NEEDED FOR SLEEP 60 capsule 0  . triamterene-hydrochlorothiazide (MAXZIDE-25) 37.5-25 MG tablet TAKE 1/2 (ONE-HALF) TABLET BY MOUTH ONCE DAILY 45 tablet 1  . Vilazodone HCl (VIIBRYD) 20 MG TABS Take 1 tablet (20 mg total) by mouth daily. 30 tablet 3   No facility-administered medications prior to visit.     ROS Review of Systems  Constitutional: Positive for appetite change. Negative for chills, fatigue, fever and unexpected weight change.  HENT: Negative.  Negative for trouble swallowing and voice change.   Eyes: Negative.  Negative for visual disturbance.  Respiratory: Negative for cough, chest tightness, shortness of breath and wheezing.   Cardiovascular: Negative for chest pain, palpitations and leg swelling.  Gastrointestinal: Positive for nausea and vomiting. Negative for abdominal pain, constipation, diarrhea and rectal pain.  Genitourinary: Negative.  Negative for difficulty urinating, dysuria, frequency and hematuria.  Musculoskeletal: Negative.   Skin: Negative.  Negative for rash.  Neurological: Negative.  Negative for dizziness, weakness and light-headedness.  Hematological: Negative for adenopathy. Does not bruise/bleed easily.  Psychiatric/Behavioral: Negative.     Objective:  BP 122/70 (BP Location: Left Arm, Patient Position:  Sitting, Cuff Size: Normal)   Pulse 64   Temp 98 F (36.7 C) (Oral)   Resp 16   Ht 5\' 2"  (1.575 m)   Wt 135 lb (61.2 kg)   SpO2 92%   BMI 24.69 kg/m   BP Readings from Last 3 Encounters:  02/13/18 122/70  10/17/17 110/72  04/26/17 110/68    Wt Readings from Last 3 Encounters:  02/13/18 135 lb (61.2 kg)  10/17/17 137 lb (62.1 kg)  04/26/17 147 lb (66.7 kg)    Physical Exam Vitals signs reviewed.  Constitutional:      General: She is not in acute distress.    Appearance: She is not ill-appearing, toxic-appearing or diaphoretic.   HENT:     Mouth/Throat:     Mouth: Mucous membranes are moist.  Eyes:     General: No scleral icterus. Cardiovascular:     Rate and Rhythm: Normal rate and regular rhythm.     Heart sounds: Normal heart sounds. No murmur. No gallop.   Pulmonary:     Effort: Pulmonary effort is normal.     Breath sounds: Normal breath sounds. No stridor. No wheezing, rhonchi or rales.  Abdominal:     General: Abdomen is flat. Bowel sounds are decreased.     Palpations: Abdomen is soft. There is no hepatomegaly, splenomegaly or mass.     Tenderness: There is abdominal tenderness in the epigastric area. There is no guarding or rebound. Negative signs include Murphy's sign.     Hernia: No hernia is present.  Skin:    General: Skin is warm and dry.     Coloration: Skin is not pale.     Findings: No rash.  Neurological:     General: No focal deficit present.     Mental Status: She is alert.     Lab Results  Component Value Date   WBC 4.7 02/13/2018   HGB 14.2 02/13/2018   HCT 41.2 02/13/2018   PLT 184.0 02/13/2018   GLUCOSE 91 02/13/2018   CHOL 132 12/20/2013   TRIG 139.0 12/20/2013   HDL 46.30 12/20/2013   LDLCALC 58 12/20/2013   ALT 14 02/13/2018   AST 21 02/13/2018   NA 136 02/13/2018   K 3.9 02/13/2018   CL 97 02/13/2018   CREATININE 0.72 02/13/2018   BUN 9 02/13/2018   CO2 32 02/13/2018   TSH 2.84 10/15/2015   INR 1.01 11/16/2011   HGBA1C 5.8 03/05/2015    Dg Acute Abd 2+v Abd (supine,erect,decub) + 1v Chest  Result Date: 02/13/2018 CLINICAL DATA:  Epigastric pain over the last 2 months. EXAM: DG ABDOMEN ACUTE W/ 1V CHEST COMPARISON:  04/15/2016 FINDINGS: Bowel gas pattern is within normal limits without evidence of ileus or obstruction. Grossly normal amount of fecal matter. Few calcified granulomas within the spleen. No evidence of urinary tract calcification. Ordinary mild degenerative changes affect the spine. No evidence of free air. One-view chest shows normal heart size.  Chronically prominent pulmonary arteries. Pulmonary vascularity is otherwise normal. The lungs are clear. No effusions. No free air. IMPRESSION: Negative acute abdominal series. Electronically Signed   By: Nelson Chimes M.D.   On: 02/13/2018 16:31    Assessment & Plan:   Natausha was seen today for cough and abdominal pain.  Diagnoses and all orders for this visit:  Cough- Her chest x-ray is negative for mass or infiltrate. -     DG ACUTE ABD 2+V ABD (SUPINE,ERECT,DECUB) + 1V CHEST; Future  Epigastric abdominal tenderness without  rebound tenderness- Her exam is not consistent with an acute abdominal process.  Her lab work and plain films are normal which is reassuring.  I will treat her for acute gastroenteritis. -     CBC with Differential/Platelet; Future -     Comprehensive metabolic panel; Future -     Lipase; Future -     Amylase; Future -     Urinalysis, Routine w reflex microscopic; Future -     DG ACUTE ABD 2+V ABD (SUPINE,ERECT,DECUB) + 1V CHEST; Future  Nausea and vomiting, intractability of vomiting not specified, unspecified vomiting type- As above -     CBC with Differential/Platelet; Future -     Comprehensive metabolic panel; Future -     Lipase; Future -     Amylase; Future -     DG ACUTE ABD 2+V ABD (SUPINE,ERECT,DECUB) + 1V CHEST; Future -     ondansetron (ZOFRAN-ODT) 8 MG disintegrating tablet; Take 1 tablet (8 mg total) by mouth every 8 (eight) hours as needed for nausea or vomiting.   I am having Anaiah C. Cudworth start on ondansetron. I am also having her maintain her aspirin EC, diclofenac sodium, multivitamin, ranitidine, ibuprofen, levocetirizine, ondansetron, alosetron, albuterol, Vilazodone HCl, pramipexole, escitalopram, atorvastatin, triamterene-hydrochlorothiazide, baclofen, levETIRAcetam, and temazepam.  Meds ordered this encounter  Medications  . ondansetron (ZOFRAN-ODT) 8 MG disintegrating tablet    Sig: Take 1 tablet (8 mg total) by mouth every 8 (eight)  hours as needed for nausea or vomiting.    Dispense:  20 tablet    Refill:  0     Follow-up: Return in about 1 week (around 02/20/2018).  Scarlette Calico, MD

## 2018-02-13 NOTE — Patient Instructions (Signed)
Abdominal Pain, Adult  Abdominal pain can be caused by many things. Often, abdominal pain is not serious and it gets better with no treatment or by being treated at home. However, sometimes abdominal pain is serious. Your health care provider will do a medical history and a physical exam to try to determine the cause of your abdominal pain.  Follow these instructions at home:   Take over-the-counter and prescription medicines only as told by your health care provider. Do not take a laxative unless told by your health care provider.   Drink enough fluid to keep your urine clear or pale yellow.   Watch your condition for any changes.   Keep all follow-up visits as told by your health care provider. This is important.  Contact a health care provider if:   Your abdominal pain changes or gets worse.   You are not hungry or you lose weight without trying.   You are constipated or have diarrhea for more than 2-3 days.   You have pain when you urinate or have a bowel movement.   Your abdominal pain wakes you up at night.   Your pain gets worse with meals, after eating, or with certain foods.   You are throwing up and cannot keep anything down.   You have a fever.  Get help right away if:   Your pain does not go away as soon as your health care provider told you to expect.   You cannot stop throwing up.   Your pain is only in areas of the abdomen, such as the right side or the left lower portion of the abdomen.   You have bloody or black stools, or stools that look like tar.   You have severe pain, cramping, or bloating in your abdomen.   You have signs of dehydration, such as:  ? Dark urine, very little urine, or no urine.  ? Cracked lips.  ? Dry mouth.  ? Sunken eyes.  ? Sleepiness.  ? Weakness.  This information is not intended to replace advice given to you by your health care provider. Make sure you discuss any questions you have with your health care provider.  Document Released: 10/28/2004 Document  Revised: 08/08/2015 Document Reviewed: 07/02/2015  Elsevier Interactive Patient Education  2019 [employer]

## 2018-02-14 ENCOUNTER — Emergency Department (HOSPITAL_COMMUNITY): Payer: Medicare Other

## 2018-02-14 ENCOUNTER — Emergency Department (HOSPITAL_COMMUNITY)
Admission: EM | Admit: 2018-02-14 | Discharge: 2018-02-14 | Disposition: A | Discharge status: Home / Self Care | Payer: Medicare Other | Attending: Emergency Medicine | Admitting: Emergency Medicine

## 2018-02-14 ENCOUNTER — Ambulatory Visit: Payer: Self-pay

## 2018-02-14 ENCOUNTER — Ambulatory Visit: Payer: Self-pay | Admitting: *Deleted

## 2018-02-14 ENCOUNTER — Encounter (HOSPITAL_COMMUNITY): Payer: Self-pay

## 2018-02-14 DIAGNOSIS — R531 Weakness: Secondary | ICD-10-CM | POA: Diagnosis not present

## 2018-02-14 DIAGNOSIS — I10 Essential (primary) hypertension: Secondary | ICD-10-CM | POA: Diagnosis not present

## 2018-02-14 DIAGNOSIS — R0602 Shortness of breath: Secondary | ICD-10-CM | POA: Diagnosis not present

## 2018-02-14 DIAGNOSIS — I252 Old myocardial infarction: Secondary | ICD-10-CM | POA: Diagnosis not present

## 2018-02-14 DIAGNOSIS — Z79899 Other long term (current) drug therapy: Secondary | ICD-10-CM | POA: Diagnosis not present

## 2018-02-14 DIAGNOSIS — F17210 Nicotine dependence, cigarettes, uncomplicated: Secondary | ICD-10-CM | POA: Diagnosis not present

## 2018-02-14 LAB — TSH: TSH: 1.96 u[IU]/mL (ref 0.350–4.500)

## 2018-02-14 LAB — CBC WITH DIFFERENTIAL/PLATELET
Abs Immature Granulocytes: 0.01 10*3/uL (ref 0.00–0.07)
Basophils Absolute: 0 10*3/uL (ref 0.0–0.1)
Basophils Relative: 1 %
Eosinophils Absolute: 0 10*3/uL (ref 0.0–0.5)
Eosinophils Relative: 1 %
HCT: 41.7 % (ref 36.0–46.0)
Hemoglobin: 13.9 g/dL (ref 12.0–15.0)
IMMATURE GRANULOCYTES: 0 %
Lymphocytes Relative: 30 %
Lymphs Abs: 1.4 10*3/uL (ref 0.7–4.0)
MCH: 32.3 pg (ref 26.0–34.0)
MCHC: 33.3 g/dL (ref 30.0–36.0)
MCV: 97 fL (ref 80.0–100.0)
Monocytes Absolute: 0.4 10*3/uL (ref 0.1–1.0)
Monocytes Relative: 9 %
NEUTROS PCT: 59 %
Neutro Abs: 2.9 10*3/uL (ref 1.7–7.7)
Platelets: 169 10*3/uL (ref 150–400)
RBC: 4.3 MIL/uL (ref 3.87–5.11)
RDW: 11.9 % (ref 11.5–15.5)
WBC: 4.8 10*3/uL (ref 4.0–10.5)
nRBC: 0 % (ref 0.0–0.2)

## 2018-02-14 LAB — URINALYSIS, ROUTINE W REFLEX MICROSCOPIC
Bilirubin Urine: NEGATIVE
Glucose, UA: NEGATIVE mg/dL
Hgb urine dipstick: NEGATIVE
KETONES UR: NEGATIVE mg/dL
Leukocytes, UA: NEGATIVE
Nitrite: NEGATIVE
Protein, ur: NEGATIVE mg/dL
Specific Gravity, Urine: 1.009 (ref 1.005–1.030)
pH: 8 (ref 5.0–8.0)

## 2018-02-14 LAB — COMPREHENSIVE METABOLIC PANEL
ALT: 16 U/L (ref 0–44)
ANION GAP: 11 (ref 5–15)
AST: 27 U/L (ref 15–41)
Albumin: 3.8 g/dL (ref 3.5–5.0)
Alkaline Phosphatase: 64 U/L (ref 38–126)
BUN: 8 mg/dL (ref 8–23)
CHLORIDE: 99 mmol/L (ref 98–111)
CO2: 28 mmol/L (ref 22–32)
Calcium: 9.5 mg/dL (ref 8.9–10.3)
Creatinine, Ser: 0.7 mg/dL (ref 0.44–1.00)
GFR calc Af Amer: >60 mL/min (ref >60)
GFR calc non Af Amer: >60 mL/min (ref >60)
Glucose, Bld: 96 mg/dL (ref 70–99)
Potassium: 4.1 mmol/L (ref 3.5–5.1)
Sodium: 138 mmol/L (ref 135–145)
Total Bilirubin: 0.7 mg/dL (ref 0.3–1.2)
Total Protein: 7 g/dL (ref 6.5–8.1)

## 2018-02-14 LAB — I-STAT TROPONIN, ED: Troponin i, poc: 0 ng/mL (ref 0.00–0.08)

## 2018-02-14 LAB — BRAIN NATRIURETIC PEPTIDE: B Natriuretic Peptide: 34.7 pg/mL (ref 0.0–100.0)

## 2018-02-14 MED ORDER — SODIUM CHLORIDE 0.9 % IV BOLUS
1000.0000 mL | Freq: Once | INTRAVENOUS | Status: AC
  Administered 2018-02-14: 1000 mL via INTRAVENOUS

## 2018-02-14 NOTE — Telephone Encounter (Signed)
fyi

## 2018-02-14 NOTE — Telephone Encounter (Signed)
Pt daughter called in stating she recently spoke with pt who informed her that she is feeling faint/dizzy. Pt daughter is not at the home with pt at the moment but asked that a nurse calls pt at [phone]

## 2018-02-14 NOTE — ED Notes (Signed)
assisting patient to the bedside commode.

## 2018-02-14 NOTE — ED Notes (Signed)
Patient transported to X-ray 

## 2018-02-14 NOTE — Telephone Encounter (Signed)
Patient's daughter called to relate that Patient was found unresponsive, EMS was activated.  Patient on her way to ED.

## 2018-02-14 NOTE — ED Notes (Signed)
Pt verbalized understanding of discharge instructions and denies any further questions at this time.   

## 2018-02-14 NOTE — Telephone Encounter (Signed)
Patient is in the ED.

## 2018-02-14 NOTE — ED Provider Notes (Addendum)
[facility] EMERGENCY DEPARTMENT Provider Note   CSN: [id number] Arrival date & time: 02/14/18  1426     History   Chief Complaint Chief Complaint  Patient presents with  . Weakness    HPI [name] is a 63 y.o. female.  Patient is a 63 year old female with past medical history of prior CVA, seizure disorder, hypertension.  She presents today for evaluation of weakness.  She reports feeling this way for "quite some time", however is worsened over the past several days.  She denies any focal weakness, just feels like she has no energy.  She reports decreased appetite for the past several days.  She denies to me she is experiencing any chest pain, cough, fever, abdominal pain, nausea, vomiting, or diarrhea.  She does report mild shortness of breath.  The history is provided by the patient.  Weakness  Severity:  Moderate Onset quality:  Gradual Timing:  Constant Progression:  Worsening Chronicity:  Recurrent Relieved by:  Nothing Worsened by:  Nothing Ineffective treatments:  None tried Associated symptoms: no abdominal pain, no cough and no fever     Past Medical History:  Diagnosis Date  . Angina   . Breast cyst   . Cancer ([place])   . Depression   . Hypertension   . RLS (restless legs syndrome) 11/16/2011  . Seizures ([place]) 2016   last seizure="last year, 2016" per pt.  . Stroke ([place]) 04/20/2011   a. 04/20/11 Left subcortical infarct treated w/ TPA;  b. 04/2011 Echo: EF 55-60%;  c. 04/2011 Normal Carotid u/s  d. Residual "walk w/limp on right; unable to grasp w/right hand"  . TIA (transient ischemic attack) 08/2010  . Tobacco abuse    a. quit @ time of CVA 04/2011.    Patient Active Problem List   Diagnosis Date Noted  . Epigastric abdominal tenderness without rebound tenderness 02/13/2018  . Nausea and vomiting 02/13/2018  . PTSD (post-traumatic stress disorder) 04/01/2017  . History of stroke 02/17/2017  . Malaise and fatigue 11/02/2016  .  Diarrhea 11/02/2016  . Eye problem 05/04/2016  . Cough 03/04/2016  . Abdominal pain, epigastric 10/07/2015  . Paresthesia of both feet 03/09/2015  . Routine general medical examination at a health care facility 11/15/2014  . Nausea without vomiting 10/09/2014  . Breast pain in female 12/21/2013  . Dizziness 08/16/2013  . Adhesive capsulitis of right shoulder 06/18/2013  . Hemiparesis affecting right side as late effect of cerebrovascular accident ([place]) 06/18/2013  . Seizure ([place]) 05/19/2012  . NSTEMI, initial episode of care; Type 2 05/19/2012  . Anemia 11/17/2011  . RLS (restless legs syndrome) 11/16/2011  . Abnormal ECG 05/19/2011  . Essential hypertension, benign 05/19/2011  . Tobacco abuse 05/19/2011  . CVA (cerebral infarction) 04/23/2011  . Major depression, recurrent ([place])     Past Surgical History:  Procedure Laterality Date  . BREAST RECONSTRUCTION     bilaterally  . CARPAL TUNNEL RELEASE Left 1990's  . CARPAL TUNNEL RELEASE Left 12/08/2015  . CESAREAN SECTION  1979  . KNEE ARTHROSCOPY Left 1990's    cartilage repair  . LEFT HEART CATHETERIZATION WITH CORONARY ANGIOGRAM N/A 05/20/2011   Procedure: LEFT HEART CATHETERIZATION WITH CORONARY ANGIOGRAM;  Surgeon: Josue Hector, MD;  [facility] C[facility] LAB;  Service: Cardiovascular;  Laterality: N/A;  . MASTECTOMY Bilateral 1980's   bilateral with reconstruction     OB History   No obstetric history on file.      Home Medications  Prior to Admission medications   Medication Sig Start Date End Date Taking? Authorizing Provider  albuterol (PROVENTIL HFA;VENTOLIN HFA) 108 (90 Base) MCG/ACT inhaler Inhale 2 puffs into the lungs every 6 (six) hours as needed for wheezing or shortness of breath. 12/28/16   Hoyt Koch, MD  alosetron (LOTRONEX) 0.5 MG tablet Take 1 tablet (0.5 mg total) by mouth 2 (two) times daily as needed. cramping 11/02/16   Margarita Mail, PA-C  aspirin EC 325 MG tablet Take 325 mg by  mouth daily.    [provider]  atorvastatin (LIPITOR) 80 MG tablet TAKE 1 TABLET BY MOUTH ONCE DAILY 09/23/17   Hoyt Koch, MD  baclofen (LIORESAL) 20 MG tablet TAKE 1/2  TABLET BY MOUTH THREE TIMES DAILY FOR  1  WEEK  THEN  INCREASE  TO  ONE  TABLET  3  TIMES  DAILY 11/11/17   Hoyt Koch, MD  diclofenac sodium (VOLTAREN) 1 % GEL Apply 2 g topically 4 (four) times daily. 12/20/13   Hoyt Koch, MD  escitalopram (LEXAPRO) 20 MG tablet TAKE 1 TABLET BY MOUTH ONCE DAILY 09/23/17   Hoyt Koch, MD  ibuprofen (ADVIL,MOTRIN) 800 MG tablet Take 1 tablet by mouth every 8 (eight) hours as needed for moderate pain.  11/13/15   [provider]  levETIRAcetam (KEPPRA) 500 MG tablet Take 1 tablet (500 mg total) by mouth 2 (two) times daily. 11/25/17   Garvin Fila, MD  levocetirizine (XYZAL) 5 MG tablet TAKE 1 TABLET BY MOUTH ONCE DAILY IN THE EVENING 09/27/16   Hoyt Koch, MD  Multiple Vitamin (MULTIVITAMIN) capsule Take 1 capsule by mouth daily.     [provider]  ondansetron (ZOFRAN) 4 MG tablet Take 1 tablet (4 mg total) by mouth every 8 (eight) hours as needed for nausea or vomiting. 11/02/16   Harris, Abigail, PA-C  ondansetron (ZOFRAN-ODT) 8 MG disintegrating tablet Take 1 tablet (8 mg total) by mouth every 8 (eight) hours as needed for nausea or vomiting. 02/13/18   Janith Lima, MD  pramipexole (MIRAPEX) 0.5 MG tablet Take 1 tablet (0.5 mg total) by mouth at bedtime as needed. 04/26/17   Hoyt Koch, MD  ranitidine (ZANTAC) 300 MG capsule Take 1 capsule (300 mg total) by mouth every evening. 10/07/15   Plotnikov, Evie Lacks, MD  temazepam (RESTORIL) 15 MG capsule TAKE 1 TO 2 CAPSULES BY MOUTH AT BEDTIME AS NEEDED FOR SLEEP 11/30/17   Hoyt Koch, MD  triamterene-hydrochlorothiazide (MAXZIDE-25) 37.5-25 MG tablet TAKE 1/2 (ONE-HALF) TABLET BY MOUTH ONCE DAILY 10/11/17   Hoyt Koch, MD  Vilazodone  HCl (VIIBRYD) 20 MG TABS Take 1 tablet (20 mg total) by mouth daily. 04/01/17   Hoyt Koch, MD    Family History Family History  Problem Relation Age of Onset  . Lupus Mother        died early 72's.  . Emphysema Father        died late 57's.  [name] COPD Father   . Pancreatic cancer Brother   . Bladder Cancer Brother   . Rectal cancer Brother   . Suicidality Brother   . Colon cancer Neg Hx     Social History Social History   Tobacco Use  . Smoking status: Current Every Day Smoker    Packs/day: 0.50    Years: 37.00    Pack years: 18.50    Types: Cigarettes  . Smokeless tobacco: Never Used  Substance Use  Topics  . Alcohol use: Yes    Alcohol/week: 7.0 standard drinks    Types: 7 Glasses of wine per week    Comment: very occasional  . Drug use: No     Allergies   Codeine   Review of Systems Review of Systems  Constitutional: Negative for fever.  Respiratory: Negative for cough.   Gastrointestinal: Negative for abdominal pain.  Neurological: Positive for weakness.  All other systems reviewed and are negative.    Physical Exam Updated Vital Signs There were no vitals taken for this visit.  Physical Exam Vitals signs and nursing note reviewed.  Constitutional:      General: She is not in acute distress.    Appearance: She is well-developed. She is not diaphoretic.  HENT:     Head: Normocephalic and atraumatic.  Neck:     Musculoskeletal: Normal range of motion and neck supple.  Cardiovascular:     Rate and Rhythm: Normal rate and regular rhythm.     Heart sounds: No murmur. No friction rub. No gallop.   Pulmonary:     Effort: Pulmonary effort is normal. No respiratory distress.     Breath sounds: Normal breath sounds. No wheezing.  Abdominal:     General: Bowel sounds are normal. There is no distension.     Palpations: Abdomen is soft.     Tenderness: There is no abdominal tenderness.  Musculoskeletal: Normal range of motion.  Skin:     General: Skin is warm and dry.  Neurological:     Mental Status: She is alert and oriented to person, place, and time.      ED Treatments / Results  Labs (all labs ordered are listed, but only abnormal results are displayed) Labs Reviewed  COMPREHENSIVE METABOLIC PANEL  CBC WITH DIFFERENTIAL/PLATELET  BRAIN NATRIURETIC PEPTIDE  TSH  URINALYSIS, ROUTINE W REFLEX MICROSCOPIC  I-STAT TROPONIN, ED    EKG EKG Interpretation  Date/Time:  Tuesday February 14 2018 14:34:13 EST Ventricular Rate:  57 PR Interval:    QRS Duration: 97 QT Interval:  474 QTC Calculation: 462 R Axis:   -63 Text Interpretation:  Sinus rhythm Left anterior fascicular block Nonspecific T abnormalities, anterior leads Confirmed by Veryl Speak [id number]) on 02/14/2018 6:07:52 PM   Radiology Dg Acute Abd 2+v Abd (supine,erect,decub) + 1v Chest  Result Date: 02/13/2018 CLINICAL DATA:  Epigastric pain over the last 2 months. EXAM: DG ABDOMEN ACUTE W/ 1V CHEST COMPARISON:  04/15/2016 FINDINGS: Bowel gas pattern is within normal limits without evidence of ileus or obstruction. Grossly normal amount of fecal matter. Few calcified granulomas within the spleen. No evidence of urinary tract calcification. Ordinary mild degenerative changes affect the spine. No evidence of free air. One-view chest shows normal heart size. Chronically prominent pulmonary arteries. Pulmonary vascularity is otherwise normal. The lungs are clear. No effusions. No free air. IMPRESSION: Negative acute abdominal series. Electronically Signed   By: Nelson Chimes M.D.   On: 02/13/2018 16:31    Procedures Procedures (including critical care time)  Medications Ordered in ED Medications - No data to display   Initial Impression / Assessment and Plan / ED Course  I have reviewed the triage vital signs and the nursing notes.  Pertinent labs & imaging results that were available during my care of the patient were reviewed by me and considered in my  medical decision making (see chart for details).  Patient presents here with complaints of weakness.  She states that this has been going on  for "some time", however has been worse over the past few days.  Her physical examination is unremarkable and work-up is also unremarkable.  Her laboratory studies are all normal, urinalysis is clear, EKG shows no changes and chest x-ray is clear.  I am uncertain as to the exact etiology of this weakness, however nothing appears emergent.  She asked whether or not her depression or depression medications could be causing this.  I have advised her to discuss this possibility with her primary doctor and follow-up if she is not improving.  Final Clinical Impressions(s) / ED Diagnoses   Final diagnoses:  None    ED Discharge Orders    None       Veryl Speak, MD 02/14/18 Dionicia Abler    Veryl Speak, MD 03/12/18 [phone]

## 2018-02-14 NOTE — Discharge Instructions (Addendum)
Continue medications as previously prescribed.  Follow-up with your primary doctor in the next week, and return to the ER if symptoms significantly worsen or change. 

## 2018-02-14 NOTE — ED Triage Notes (Signed)
Pt presents for evaluation of weakness and "not feeling well." was seen at PCP yesterday who did blood work but does not have results. No URI symptoms. Reports 88% on RA on EMS arrival, placed on 2L [place]. Reports daughter noted that patient had dog's medications at her bedside in addition to her medications, which is unusual for her.

## 2018-02-14 NOTE — ED Notes (Signed)
ED Provider at bedside. 

## 2018-02-14 NOTE — ED Notes (Signed)
Pt attempted to use bedpan with this tech; was unsuccessful. This is the second time today she hasn't been able to pee.

## 2018-02-16 ENCOUNTER — Encounter: Payer: Self-pay | Admitting: Internal Medicine

## 2018-02-16 NOTE — Progress Notes (Signed)
Abstracted and sent to scan  

## 2018-02-17 NOTE — Progress Notes (Deleted)
[facility]  PATIENT: Anna Lyons DOB: [birth year]   REASON FOR VISIT: Follow-up for history of stroke in 2013, seizure disorder , mild cognitive impairment long history of depression HISTORY FROM: Patient    HISTORY OF PRESENT ILLNESS:Anna Lyons is a 63 year old female with a history of stroke. She returns today for follow-up. The patient continues on aspirin for stroke prevention. She denies any significant bruising or bleeding. The patient's blood pressure and cholesterol has been managed by her primary care provider. Her blood pressure is in good control today. The patient continues to use baclofen for spasticity and she reports that this is working well. Her primary care provides her with Klonopin to help with restless leg symptoms, she was most recently  started on Mirapex in addition to the [place]. Patient reports that since the last visit she had a fall. This occurred in September. She states that she does not recall any events surrounding the fall other than that she was going to the bathroom and apparently  fell and hit her head. She did call her daughter who came and took her to the emergency room. Patient states that she has  trouble with her balance and she did not use her walker when she went to the bathroom. She states that in the emergency room she was diagnosed with a concussion and had a hematoma from the fall. Since then the patient has continued to have a headache daily. She is also very sleepy throughout the day. She denies any other symptoms. She denies any seizure events. She is continued to take Keppra 250 mg twice a day. After her fall she denies biting her tongue or loss of bowels or bladder. She returns today for an evaluation. Update 05/14/2014 PS: She returns for follow-up after last visit 6 months ago. She is a complaint bad daughter. She remains in a logical stable without recurrent stroke or TIA symptoms. She states her blood pressure is under good  control. Last lipid profile checked several months ago was fine. She remains on aspirin which is tolerating well without bleeding, bruising. She has not had any seizures and she left the hospital. At last visit the [place] dose was reduced and she is tolerating well without breakthrough seizures. She remains on baclofen 20 mg 3 times daily which seems to help her spasticity and she is tolerating it well. She has noted increase in restless leg symptoms and she takes Klonopin 1 mg at night which seems to help. She has tried Requip in the past which has not work for her. She was recently changed from Zoloft to Prozac for depression and it seems to be helping. She has not had any carotid Dopplers done for nearly a year. She is able to walk with a wheeled walker and has had no recent falls.  Update 05/26/2015 : She returns for follow-up after last visit 1 year ago with nurse practitioner. Patient has a new complaint of worsening of her feet paresthesias particularly since she started a new part-time job in which she has to stay on her feet for 4 hours. She reports significant burning of her toes as well as the soles mostly in the right leg but the left leg also to lesser degree. She does have a diagnosis of restless leg syndrome and takes Klonopin and Mirapex at night but has not yet tried taking either medication during the daytime. She does admit to her depression be not adequately treated. She has been on Cymbalta 60 mg daily  since the death of her husband 3 years ago. She has not had any falls or injuries. She has been taking baclofen 20 mg 3 times daily for her post stroke spasticity and walking difficulty and is tolerating it well without any side effects. She is worried that she may have peripheral neuropathy and has not had nerve conduction evaluation for that Update 03/29/2016 PS; she returns for follow-up after last visit 10 months ago. She is doing well without recurrent stroke or TIA symptoms since November  2013. She states she had a possible TIA last year and was seen at: [facility] and went home. She complains of decreased energy and poor stamina. Memory is also been quite poor. Patient was previously on Cymbalta for depression and previously had failed Zoloft. She was recently switched to Wellbutrin 150 mg twice daily but she feels her depression is still not adequately treated. She has not been working. She is quite emotional. She cries easily. She has been managed for depression by her primary care physician Dr. Sharlet Salina. She has not seen a psychiatrist. Patient continued to tolerate Keppra well and has not had any breakthrough seizures this year. She states her blood pressure is well controlled today it is 125/60. She is tolerating Lipitor well without muscle aches or pains. She complains of some burning in her feet and his seeing a foot doctor for that. She did wear her orthotic implant but that does not seem to affect her walking. She has had nerve conduction EMG done in the past by Dr. Charlestine Night which was apparently not suggestive of neuropathy. She recently had surgery in the left hand for trigger finger. UPDATE 1/17/2019CM Anna Lyons, 63 year old female returns for follow-up with history of stroke in November 2013.  She is currently on aspirin and Lipitor for secondary stroke prevention without further stroke or TIA symptoms.  She has minimal bruising and no bleeding she continues to complain with memory issues however she continues to work full-time and has not had problems with her job.  She has a long history of depression but has never seen psychiatry.  She is currently on Lexapro.  Patient also has seizure disorder and is on Keppra 500 twice daily without any seizure activity since last seen.  She denies any side effects to the medication.  EMG nerve conduction in the past not suggestive of neuropathy.  She says she occasionally has balance issues and uses her walker.  She does not have an assistive device  today.  She says she drives without difficulty.  Blood pressure in the office today 101/66.  She was encouraged to stay well-hydrated.  She is on baclofen for spasticity and Mirapex for restless legs through  primary care provider.  She returns for reevaluation  REVIEW OF SYSTEMS: Full 14 system review of systems performed and notable only for those listed, all others are neg:  Constitutional: Fatigue Cardiovascular: neg Ear/Nose/Throat: neg  Skin: neg Eyes: neg Respiratory: neg Gastroitestinal: neg  Hematology/Lymphatic: neg  Endocrine: neg Musculoskeletal: Balance issues at home Allergy/Immunology: neg Neurological: neg Psychiatric: Depression Sleep : neg   ALLERGIES: Allergies  Allergen Reactions  . Codeine Itching    HOME MEDICATIONS: Outpatient Medications Prior to Visit  Medication Sig Dispense Refill  . albuterol (PROVENTIL HFA;VENTOLIN HFA) 108 (90 Base) MCG/ACT inhaler Inhale 2 puffs into the lungs every 6 (six) hours as needed for wheezing or shortness of breath. 1 Inhaler 0  . alosetron (LOTRONEX) 0.5 MG tablet Take 1 tablet (0.5 mg total) by mouth  2 (two) times daily as needed. cramping 20 tablet 0  . aspirin EC 325 MG tablet Take 325 mg by mouth daily.    [name] atorvastatin (LIPITOR) 80 MG tablet TAKE 1 TABLET BY MOUTH ONCE DAILY 90 tablet 1  . baclofen (LIORESAL) 20 MG tablet TAKE 1/2  TABLET BY MOUTH THREE TIMES DAILY FOR  1  WEEK  THEN  INCREASE  TO  ONE  TABLET  3  TIMES  DAILY 90 tablet 2  . diclofenac sodium (VOLTAREN) 1 % GEL Apply 2 g topically 4 (four) times daily. 100 g 3  . escitalopram (LEXAPRO) 20 MG tablet TAKE 1 TABLET BY MOUTH ONCE DAILY (Patient taking differently: Take 20 mg by mouth daily. ) 90 tablet 1  . ibuprofen (ADVIL,MOTRIN) 800 MG tablet Take 1 tablet by mouth every 8 (eight) hours as needed for moderate pain.   1  . levETIRAcetam (KEPPRA) 500 MG tablet Take 1 tablet (500 mg total) by mouth 2 (two) times daily. 180 tablet 3  . levocetirizine  (XYZAL) 5 MG tablet TAKE 1 TABLET BY MOUTH ONCE DAILY IN THE EVENING 30 tablet 3  . Multiple Vitamin (MULTIVITAMIN) capsule Take 1 capsule by mouth daily.     . ondansetron (ZOFRAN) 4 MG tablet Take 1 tablet (4 mg total) by mouth every 8 (eight) hours as needed for nausea or vomiting. 10 tablet 0  . ondansetron (ZOFRAN-ODT) 8 MG disintegrating tablet Take 1 tablet (8 mg total) by mouth every 8 (eight) hours as needed for nausea or vomiting. 20 tablet 0  . pramipexole (MIRAPEX) 0.5 MG tablet Take 1 tablet (0.5 mg total) by mouth at bedtime as needed. 90 tablet 1  . ranitidine (ZANTAC) 300 MG capsule Take 1 capsule (300 mg total) by mouth every evening. 30 capsule 5  . temazepam (RESTORIL) 15 MG capsule TAKE 1 TO 2 CAPSULES BY MOUTH AT BEDTIME AS NEEDED FOR SLEEP 60 capsule 0  . triamterene-hydrochlorothiazide (MAXZIDE-25) 37.5-25 MG tablet TAKE 1/2 (ONE-HALF) TABLET BY MOUTH ONCE DAILY (Patient taking differently: Take 0.5 tablets by mouth daily. ) 45 tablet 1  . Vilazodone HCl (VIIBRYD) 20 MG TABS Take 1 tablet (20 mg total) by mouth daily. 30 tablet 3   No facility-administered medications prior to visit.     PAST MEDICAL HISTORY: Past Medical History:  Diagnosis Date  . Angina   . Breast cyst   . Cancer ([place])   . Depression   . Hypertension   . RLS (restless legs syndrome) 11/16/2011  . Seizures ([place]) 2016   last seizure="last year, 2016" per pt.  . Stroke ([place]) 04/20/2011   a. 04/20/11 Left subcortical infarct treated w/ TPA;  b. 04/2011 Echo: EF 55-60%;  c. 04/2011 Normal Carotid u/s  d. Residual "walk w/limp on right; unable to grasp w/right hand"  . TIA (transient ischemic attack) 08/2010  . Tobacco abuse    a. quit @ time of CVA 04/2011.    PAST SURGICAL HISTORY: Past Surgical History:  Procedure Laterality Date  . BREAST RECONSTRUCTION     bilaterally  . CARPAL TUNNEL RELEASE Left 1990's  . CARPAL TUNNEL RELEASE Left 12/08/2015  . CESAREAN SECTION  1979  . KNEE ARTHROSCOPY  Left 1990's    cartilage repair  . LEFT HEART CATHETERIZATION WITH CORONARY ANGIOGRAM N/A 05/20/2011   Procedure: LEFT HEART CATHETERIZATION WITH CORONARY ANGIOGRAM;  Surgeon: Josue Hector, MD;  Location[facility] CATH LAB;  Service: Cardiovascular;  Laterality: N/A;  . MASTECTOMY Bilateral 1980's  bilateral with reconstruction    FAMILY HISTORY: Family History  Problem Relation Age of Onset  . Lupus Mother        died early 36's.  . Emphysema Father        died late 105's.  [name] COPD Father   . Pancreatic cancer Brother   . Bladder Cancer Brother   . Rectal cancer Brother   . Suicidality Brother   . Colon cancer Neg Hx     SOCIAL HISTORY: Social History   Socioeconomic History  . Marital status: Widowed    Spouse name: Not on file  . Number of children: 1  . Years of education: 1  . Highest education level: Not on file  Occupational History  . Occupation: disabled  Social Needs  . Financial resource strain: Not on file  . Food insecurity:    Worry: Not on file    Inability: Not on file  . Transportation needs:    Medical: Not on file    Non-medical: Not on file  Tobacco Use  . Smoking status: Current Every Day Smoker    Packs/day: 0.50    Years: 37.00    Pack years: 18.50    Types: Cigarettes  . Smokeless tobacco: Never Used  Substance and Sexual Activity  . Alcohol use: Yes    Alcohol/week: 7.0 standard drinks    Types: 7 Glasses of wine per week    Comment: very occasional  . Drug use: No  . Sexual activity: Never  Lifestyle  . Physical activity:    Days per week: Not on file    Minutes per session: Not on file  . Stress: Not on file  Relationships  . Social connections:    Talks on phone: Not on file    Gets together: Not on file    Attends religious service: Not on file    Active member of club or organization: Not on file    Attends meetings of clubs or organizations: Not on file    Relationship status: Not on file  . Intimate partner violence:     Fear of current or ex partner: Not on file    Emotionally abused: Not on file    Physically abused: Not on file    Forced sexual activity: Not on file  Other Topics Concern  . Not on file  Social History Narrative   Pt lives alone.   Caffeine Use: 1-3 cups of caffeine daily (tea)     PHYSICAL EXAM  There were no vitals filed for this visit. There is no height or weight on file to calculate BMI.  Generalized: Well developed, in no acute distress  Head: normocephalic and atraumatic,. Oropharynx benign  Neck: Supple,   Musculoskeletal: No deformity   Neurological examination   Mentation: Alert oriented to time, place, history taking.  Slightly diminished attention span short-term memory   Follows all commands speech and language fluent.   Cranial nerve II-XII: Pupils were equal round reactive to light extraocular movements were full, visual field were full on confrontational test. Facial sensation and strength were normal. hearing was intact to finger rubbing bilaterally. Uvula tongue midline. head turning and shoulder shrug were normal and symmetric.Tongue protrusion into cheek strength was normal. Motor: normal bulk and tone, full strength in the BUE, BLE,  Sensory: Diminished touch and pinprick and vibratory in both feet over the toes only .  Coordination: finger-nose-finger, heel-to-shin bilaterally, no dysmetria Reflexes: Symmetric upper and lower except diminished right ankle jerk,  plantar responses were flexor bilaterally. Gait and Station: Rising up from seated position without assistance, normal stance,  moderate stride, good arm swing, smooth turning, able to perform tiptoe, and heel walking without difficulty. Tandem gait is unsteady.  No assistive device  DIAGNOSTIC DATA (LABS, IMAGING, TESTING) - I reviewed patient records, labs, notes, testing and imaging myself where available.  Lab Results  Component Value Date   WBC 4.8 02/14/2018   HGB 13.9 02/14/2018   HCT 41.7  02/14/2018   MCV 97.0 02/14/2018   PLT 169 02/14/2018      Component Value Date/Time   NA 138 02/14/2018 1511   K 4.1 02/14/2018 1511   CL 99 02/14/2018 1511   CO2 28 02/14/2018 1511   GLUCOSE 96 02/14/2018 1511   BUN 8 02/14/2018 1511   CREATININE 0.70 02/14/2018 1511   CALCIUM 9.5 02/14/2018 1511   PROT 7.0 02/14/2018 1511   ALBUMIN 3.8 02/14/2018 1511   AST 27 02/14/2018 1511   ALT 16 02/14/2018 1511   ALKPHOS 64 02/14/2018 1511   BILITOT 0.7 02/14/2018 1511   GFRNONAA >60 02/14/2018 1511   GFRAA >60 02/14/2018 1511    Lab Results  Component Value Date   HGBA1C 5.8 03/05/2015   Lab Results  Component Value Date   VITAMINB12 468 10/15/2015   Lab Results  Component Value Date   TSH 1.960 02/14/2018      ASSESSMENT AND PLAN  63 y.o. year old female  has a past medical history of Angina,  Depression, Hypertension, RLS (restless legs syndrome) (11/16/2011), Seizures ([place]) (2016), Stroke ([place]) (04/20/2011), TIA (transient ischemic attack) (08/2010), here to follow-up for her seizure disorder and history of stroke.   PLAN: Continue aspirin for secondary stroke prevention  continue Lipitor for secondary stroke prevention Blood pressure in the office today 101/66, make sure you stay well-hydrated Continue Keppra 500 mg twice daily for seizure control Call for any seizure activity Continue treatment for depression Be careful with ambulation, fall risk in the past. Follow-up yearly and as needed I spent 25 minutes in total face to face time with the patient more than 50% of which was spent counseling and coordination of care, reviewing test results reviewing medications and discussing and reviewing the diagnosis of stroke and management of risk factors, seizure disorder and avoidance of seizure triggers long-term treatment of depression and importance of staying on therapy. Dennie Bible, [facility], [facility], APRN  [facility] [address] [place], [place] [address] [phone]

## 2018-02-20 ENCOUNTER — Ambulatory Visit: Payer: Medicare Other | Admitting: Nurse Practitioner

## 2018-03-27 ENCOUNTER — Other Ambulatory Visit: Payer: Self-pay | Admitting: Internal Medicine

## 2018-04-06 ENCOUNTER — Telehealth: Payer: Self-pay

## 2018-04-06 DIAGNOSIS — Z1239 Encounter for other screening for malignant neoplasm of breast: Secondary | ICD-10-CM

## 2018-04-06 NOTE — Telephone Encounter (Signed)
LVM informing patient mammogram has been ordered

## 2018-04-06 NOTE — Telephone Encounter (Signed)
Copied from [place] [id number]. Topic: Referral - Request for Referral >> Apr 06, 2018  8:42 AM Virl A[place] D wrote: Has patient seen PCP for this complaint? No *If NO, is insurance requiring patient see PCP for this issue before PCP can refer them? Referral for which specialty:Mammogram Preferred provider/office: [facility] Reason for referral: Routine / Pt stated insurance needs a referral

## 2018-04-06 NOTE — Telephone Encounter (Signed)
Order placed for mammogram but this is not a referral.

## 2018-04-06 NOTE — Addendum Note (Signed)
Addended by: Pricilla Holm A on: 04/06/2018 11:23 AM   Modules accepted: Orders

## 2018-04-06 NOTE — Telephone Encounter (Signed)
Patient is advised.  

## 2018-04-07 ENCOUNTER — Encounter: Payer: Self-pay | Admitting: Internal Medicine

## 2018-04-07 ENCOUNTER — Ambulatory Visit (INDEPENDENT_AMBULATORY_CARE_PROVIDER_SITE_OTHER): Payer: Medicare Other | Admitting: Internal Medicine

## 2018-04-07 ENCOUNTER — Other Ambulatory Visit: Payer: Self-pay | Admitting: Internal Medicine

## 2018-04-07 VITALS — BP 108/68 | HR 62 | Temp 98.1°F | Ht 62.0 in | Wt 137.0 lb

## 2018-04-07 DIAGNOSIS — I10 Essential (primary) hypertension: Secondary | ICD-10-CM | POA: Diagnosis not present

## 2018-04-07 DIAGNOSIS — N644 Mastodynia: Secondary | ICD-10-CM | POA: Diagnosis not present

## 2018-04-07 DIAGNOSIS — F332 Major depressive disorder, recurrent severe without psychotic features: Secondary | ICD-10-CM | POA: Diagnosis not present

## 2018-04-07 MED ORDER — HYDROCHLOROTHIAZIDE 12.5 MG PO CAPS: 12.5000 mg | ORAL_CAPSULE | Freq: Every day | ORAL | 3 refills | Status: DC

## 2018-04-07 MED ORDER — VILAZODONE HCL 40 MG PO TABS: 40.0000 mg | ORAL_TABLET | Freq: Every day | ORAL | 0 refills | Status: DC

## 2018-04-07 NOTE — Assessment & Plan Note (Signed)
Change hctz to 12.5 mg pills so that she does not have to split in half. BP at goal on her current hctz 12.5 mg daily.

## 2018-04-07 NOTE — Telephone Encounter (Signed)
Requested medication (s) are due for refill today: yes  Requested medication (s) are on the active medication list: medication is but dose was changed from 20 mg to 40 mg today. Increased dose not called in to pharmacy and new dose needs updated on  active medication list   Last refill:  04/01/17 #30 2 RF   Future visit scheduled: no was seen in office today  Notes to clinic: Will double viibryd from 20 mg daily to 40 mg daily. She has failed a lot of agents and this has been one of the only things to help some. She will call back in 1-2 weeks to let us know if helping. Denies SI/HI. Talked to her about grief counseling and coping skills   Requested Prescriptions  Pending Prescriptions Disp Refills   Vilazodone HCl (VIIBRYD) 20 MG TABS 30 tablet 3    Sig: Take 1 tablet (20 mg total) by mouth daily.     Psychiatry:  Antidepressants - SSRI Failed - 04/07/2018  2:41 PM      Failed - Completed PHQ-2 or PHQ-9 in the last 360 days.      Passed - Valid encounter within last 6 months    Recent Outpatient Visits          Today Pain of left breast   [place], Elizabeth A, MD   1 month ago Cough   [place] Tolstoy, MD   5 months ago Need for influenza vaccination   [place], Elizabeth A, MD   11 months ago Severe episode of recurrent major depressive disorder, without psychotic features [facility])   [facility] -Chuck Hint, MD   1 year ago PTSD (post-traumatic stress disorder)   [facility] -Chuck Hint, MD

## 2018-04-07 NOTE — Telephone Encounter (Signed)
Sent in 40 mg viibryd. Should call us with status in 1-2 weeks.

## 2018-04-07 NOTE — Progress Notes (Signed)
   Subjective:   Patient ID: Anna Lyons, female    DOB: Mar 19, 1955, 63 y.o.   MRN: [id number]  HPI The patient is a 63 YO female coming in for left breast pain and tenderness (denies lump, started about 2 weeks ago but has had off and on over the years, overall it is worsening, denies mass or nipple discharge or rash on skin, has tried nothing), and depression (worse in the last month due to loss of a friend she had known a long time, she is still taking viibryd daily, is sleeping a lot and no appetite, no energy to do anything, still making it to work, not socializing at all, denies SI/HI) and blood pressure (having difficulty cutting her hctz pill in half, keeps losing parts of pills as they are small and difficult to cut in half, denies high BP, denies chest pains or headaches).   Review of Systems  Constitutional: Positive for activity change and appetite change.  HENT: Negative.   Eyes: Negative.   Respiratory: Negative for cough, chest tightness and shortness of breath.   Cardiovascular: Negative for chest pain, palpitations and leg swelling.  Gastrointestinal: Negative for abdominal distention, abdominal pain, constipation, diarrhea, nausea and vomiting.  Genitourinary:       Breast pain  Musculoskeletal: Negative.   Skin: Negative.   Neurological: Negative.   Psychiatric/Behavioral: Positive for decreased concentration, dysphoric mood and sleep disturbance. Negative for agitation, confusion, hallucinations, self-injury and suicidal ideas. The patient is nervous/anxious. The patient is not hyperactive.     Objective:  Physical Exam Constitutional:      Appearance: She is well-developed.  HENT:     Head: Normocephalic and atraumatic.  Neck:     Musculoskeletal: Normal range of motion.  Cardiovascular:     Rate and Rhythm: Normal rate and regular rhythm.     Comments: Breast exam without lesion bilateral. No LAD bilateral axillary. Prior scar right breast from prior breast cancer  surgery. No nipple discharge bilateral.  Pulmonary:     Effort: Pulmonary effort is normal. No respiratory distress.     Breath sounds: Normal breath sounds. No wheezing or rales.  Abdominal:     General: Bowel sounds are normal. There is no distension.     Palpations: Abdomen is soft.     Tenderness: There is no abdominal tenderness. There is no rebound.  Skin:    General: Skin is warm and dry.  Neurological:     Mental Status: She is alert and oriented to person, place, and time.     Coordination: Coordination normal.  Psychiatric:     Comments: Flat affect and tearful     Vitals:   04/07/18 1321  BP: 108/68  Pulse: 62  Temp: 98.1 F (36.7 C)  TempSrc: Oral  SpO2: 94%  Weight: 137 lb (62.1 kg)  Height: 5\' 2"  (1.575 m)    Assessment & Plan:

## 2018-04-07 NOTE — Telephone Encounter (Signed)
Copied from [place] [id number]. Topic: Quick Communication - Rx Refill/Question >> Apr 07, 2018  2:35 PM [place], [place] D wrote: Medication: Vilazodone HCl (VIIBRYD) 20 MG TABS / Pt had OV today and this rx was not refilled. Pt is completely out. Please advise.  Has the patient contacted their pharmacy? Yes.   (Agent: If no, request that the patient contact the pharmacy for the refill.) (Agent: If yes, when and what did the pharmacy advise?)  Preferred Pharmacy (with phone number or street name): [facility], [employer]. [phone] (Phone) [phone] (Fax)  Agent: Please be advised that RX refills may take up to 3 business days. We ask that you follow-up with your pharmacy.

## 2018-04-07 NOTE — Assessment & Plan Note (Signed)
Will double viibryd from 20 mg daily to 40 mg daily. She has failed a lot of agents and this has been one of the only things to help some. She will call back in 1-2 weeks to let us know if helping. Denies SI/HI. Talked to her about grief counseling and coping skills.

## 2018-04-07 NOTE — Patient Instructions (Addendum)
We have sent in the blood pressure medicine today to just take 1 pill daily.   We will have you double the viibryd to 2 pills daily and call us in 1-2 weeks to let us know if it is helping with the mood.   We are getting the mammogram and ultrasound of the breasts to check.

## 2018-04-07 NOTE — Assessment & Plan Note (Signed)
Ordered diagnostic mammogram and ultrasound bilateral. She does have history of breast cancer. No concerning lesions during breast exam and prior cysts. Can use tylenol for pain.

## 2018-05-01 ENCOUNTER — Other Ambulatory Visit: Payer: Self-pay | Admitting: Internal Medicine

## 2018-05-02 ENCOUNTER — Ambulatory Visit
Admission: RE | Admit: 2018-05-02 | Discharge: 2018-05-02 | Disposition: A | Discharge status: Home / Self Care | Payer: Medicare Other | Source: Ambulatory Visit | Attending: Internal Medicine | Admitting: Internal Medicine

## 2018-05-02 ENCOUNTER — Other Ambulatory Visit: Payer: Self-pay

## 2018-05-02 DIAGNOSIS — N644 Mastodynia: Secondary | ICD-10-CM

## 2018-05-03 ENCOUNTER — Other Ambulatory Visit: Payer: Self-pay | Admitting: Internal Medicine

## 2018-05-24 ENCOUNTER — Telehealth: Payer: Self-pay | Admitting: Internal Medicine

## 2018-05-24 NOTE — Telephone Encounter (Signed)
Anna Lyons from [facility] called because her temazepam can not be filled until the provider calls [phone] and gives the override for the medication to be taken more than once a day since they only approve for once daily. After this is done he is requesting you call him back to let him know so he can precede with the refill [phone]

## 2018-05-25 ENCOUNTER — Other Ambulatory Visit: Payer: Self-pay | Admitting: Internal Medicine

## 2018-05-25 NOTE — Telephone Encounter (Signed)
Needed a PA did this over phone waiting for denial or acceptance letter from insurance

## 2018-05-25 NOTE — Telephone Encounter (Signed)
The sig is clear and has not changed recently. She likely needs refills, is that the case? She should not be taking more than once a day. The sig reads 1-2 pills at bedtime as needed for sleep.

## 2018-05-29 ENCOUNTER — Other Ambulatory Visit: Payer: Self-pay | Admitting: Internal Medicine

## 2018-05-29 ENCOUNTER — Telehealth: Payer: Self-pay | Admitting: Internal Medicine

## 2018-05-29 MED ORDER — TEMAZEPAM 15 MG PO CAPS: 15.0000 mg | ORAL_CAPSULE | Freq: Every evening | ORAL | 2 refills | Status: DC | PRN

## 2018-05-29 NOTE — Telephone Encounter (Signed)
Copied from [place] [id number]. Topic: Quick Communication - Rx Refill/Question >> May 29, 2018 10:49 AM Scherrie Gerlach wrote: Medication: temazepam (RESTORIL) 15 MG capsule  Pt states [facility] is out of this med, and doesn't know when they will get in.  Pt requesting send to  [facility], [address]. She has confirmed they do have this med in stock. Pt hopes to get today, as she is out of her [place] [address], [place] [phone] (Phone) [phone] (Fax)

## 2018-05-29 NOTE — Telephone Encounter (Signed)
Patient left a message stating that the pharmacy called and told her the providers office declined her prescription and she wanted to know why. I called patient and advised her the provider did not decline the prescription her insurance did and requested a prior authorization. The prior authorization was done, approved by her insurance, and a new refill was sent on 05/25/2018. Patient is aware

## 2018-06-01 ENCOUNTER — Other Ambulatory Visit: Payer: Self-pay | Admitting: Internal Medicine

## 2018-06-16 ENCOUNTER — Other Ambulatory Visit: Payer: Self-pay | Admitting: Internal Medicine

## 2018-06-21 ENCOUNTER — Other Ambulatory Visit: Payer: Self-pay | Admitting: Internal Medicine

## 2018-06-30 ENCOUNTER — Other Ambulatory Visit: Payer: Self-pay | Admitting: Internal Medicine

## 2018-07-19 ENCOUNTER — Encounter (HOSPITAL_COMMUNITY): Payer: Self-pay | Admitting: Emergency Medicine

## 2018-07-19 ENCOUNTER — Other Ambulatory Visit: Payer: Self-pay

## 2018-07-19 ENCOUNTER — Observation Stay (HOSPITAL_BASED_OUTPATIENT_CLINIC_OR_DEPARTMENT_OTHER): Payer: Medicare Other

## 2018-07-19 ENCOUNTER — Observation Stay (HOSPITAL_COMMUNITY)
Admission: EM | Admit: 2018-07-19 | Discharge: 2018-07-20 | Disposition: A | Discharge status: Home / Self Care | Payer: Medicare Other | Attending: Internal Medicine | Admitting: Internal Medicine

## 2018-07-19 ENCOUNTER — Other Ambulatory Visit (HOSPITAL_COMMUNITY): Payer: Medicare Other

## 2018-07-19 ENCOUNTER — Emergency Department (HOSPITAL_COMMUNITY): Payer: Medicare Other

## 2018-07-19 DIAGNOSIS — Z8673 Personal history of transient ischemic attack (TIA), and cerebral infarction without residual deficits: Secondary | ICD-10-CM

## 2018-07-19 DIAGNOSIS — Z791 Long term (current) use of non-steroidal anti-inflammatories (NSAID): Secondary | ICD-10-CM | POA: Diagnosis not present

## 2018-07-19 DIAGNOSIS — I2510 Atherosclerotic heart disease of native coronary artery without angina pectoris: Secondary | ICD-10-CM | POA: Insufficient documentation

## 2018-07-19 DIAGNOSIS — F329 Major depressive disorder, single episode, unspecified: Secondary | ICD-10-CM | POA: Insufficient documentation

## 2018-07-19 DIAGNOSIS — I517 Cardiomegaly: Secondary | ICD-10-CM

## 2018-07-19 DIAGNOSIS — I700 Atherosclerosis of aorta: Secondary | ICD-10-CM | POA: Diagnosis not present

## 2018-07-19 DIAGNOSIS — E785 Hyperlipidemia, unspecified: Secondary | ICD-10-CM | POA: Insufficient documentation

## 2018-07-19 DIAGNOSIS — Z885 Allergy status to narcotic agent status: Secondary | ICD-10-CM | POA: Diagnosis not present

## 2018-07-19 DIAGNOSIS — Z79899 Other long term (current) drug therapy: Secondary | ICD-10-CM | POA: Diagnosis not present

## 2018-07-19 DIAGNOSIS — Z720 Tobacco use: Secondary | ICD-10-CM | POA: Diagnosis present

## 2018-07-19 DIAGNOSIS — R0603 Acute respiratory distress: Secondary | ICD-10-CM | POA: Diagnosis present

## 2018-07-19 DIAGNOSIS — R0781 Pleurodynia: Secondary | ICD-10-CM

## 2018-07-19 DIAGNOSIS — F17210 Nicotine dependence, cigarettes, uncomplicated: Secondary | ICD-10-CM | POA: Insufficient documentation

## 2018-07-19 DIAGNOSIS — E876 Hypokalemia: Secondary | ICD-10-CM | POA: Diagnosis present

## 2018-07-19 DIAGNOSIS — I10 Essential (primary) hypertension: Secondary | ICD-10-CM | POA: Diagnosis present

## 2018-07-19 DIAGNOSIS — R0789 Other chest pain: Secondary | ICD-10-CM | POA: Diagnosis not present

## 2018-07-19 DIAGNOSIS — Z1159 Encounter for screening for other viral diseases: Secondary | ICD-10-CM | POA: Insufficient documentation

## 2018-07-19 DIAGNOSIS — Z9013 Acquired absence of bilateral breasts and nipples: Secondary | ICD-10-CM | POA: Insufficient documentation

## 2018-07-19 DIAGNOSIS — R0902 Hypoxemia: Secondary | ICD-10-CM | POA: Diagnosis present

## 2018-07-19 DIAGNOSIS — G40909 Epilepsy, unspecified, not intractable, without status epilepticus: Secondary | ICD-10-CM

## 2018-07-19 DIAGNOSIS — R569 Unspecified convulsions: Secondary | ICD-10-CM | POA: Diagnosis not present

## 2018-07-19 DIAGNOSIS — Z853 Personal history of malignant neoplasm of breast: Secondary | ICD-10-CM | POA: Insufficient documentation

## 2018-07-19 LAB — CBC WITH DIFFERENTIAL/PLATELET
Abs Immature Granulocytes: 0.03 10*3/uL (ref 0.00–0.07)
Basophils Absolute: 0 10*3/uL (ref 0.0–0.1)
Basophils Relative: 0 %
Eosinophils Absolute: 0.1 10*3/uL (ref 0.0–0.5)
Eosinophils Relative: 1 %
HCT: 38.4 % (ref 36.0–46.0)
Hemoglobin: 13 g/dL (ref 12.0–15.0)
Immature Granulocytes: 0 %
Lymphocytes Relative: 15 %
Lymphs Abs: 1.4 10*3/uL (ref 0.7–4.0)
MCH: 32.6 pg (ref 26.0–34.0)
MCHC: 33.9 g/dL (ref 30.0–36.0)
MCV: 96.2 fL (ref 80.0–100.0)
Monocytes Absolute: 0.8 10*3/uL (ref 0.1–1.0)
Monocytes Relative: 8 %
Neutro Abs: 7.4 10*3/uL (ref 1.7–7.7)
Neutrophils Relative %: 76 %
Platelets: 151 10*3/uL (ref 150–400)
RBC: 3.99 MIL/uL (ref 3.87–5.11)
RDW: 12.7 % (ref 11.5–15.5)
WBC: 9.7 10*3/uL (ref 4.0–10.5)
nRBC: 0 % (ref 0.0–0.2)

## 2018-07-19 LAB — SARS CORONAVIRUS 2: SARS Coronavirus 2: NOT DETECTED

## 2018-07-19 LAB — TROPONIN I
Troponin I: <0.03 ng/mL (ref <0.03)
Troponin I: <0.03 ng/mL (ref <0.03)
Troponin I: <0.03 ng/mL (ref <0.03)
Troponin I: <0.03 ng/mL (ref <0.03)

## 2018-07-19 LAB — URINALYSIS, ROUTINE W REFLEX MICROSCOPIC
Bilirubin Urine: NEGATIVE
Glucose, UA: NEGATIVE mg/dL
Hgb urine dipstick: NEGATIVE
Ketones, ur: NEGATIVE mg/dL
Leukocytes,Ua: NEGATIVE
Nitrite: NEGATIVE
Protein, ur: NEGATIVE mg/dL
Specific Gravity, Urine: 1.015 (ref 1.005–1.030)
pH: 5 (ref 5.0–8.0)

## 2018-07-19 LAB — LIPID PANEL
Cholesterol: 99 mg/dL (ref 0–200)
HDL: 52 mg/dL (ref >40)
LDL Cholesterol: 23 mg/dL (ref 0–99)
Total CHOL/HDL Ratio: 1.9 RATIO
Triglycerides: 119 mg/dL (ref <150)
VLDL: 24 mg/dL (ref 0–40)

## 2018-07-19 LAB — ECHOCARDIOGRAM COMPLETE

## 2018-07-19 LAB — COMPREHENSIVE METABOLIC PANEL
ALT: 17 U/L (ref 0–44)
AST: 24 U/L (ref 15–41)
Albumin: 3.6 g/dL (ref 3.5–5.0)
Alkaline Phosphatase: 61 U/L (ref 38–126)
Anion gap: 10 (ref 5–15)
BUN: 12 mg/dL (ref 8–23)
CO2: 28 mmol/L (ref 22–32)
Calcium: 8.9 mg/dL (ref 8.9–10.3)
Chloride: 100 mmol/L (ref 98–111)
Creatinine, Ser: 0.83 mg/dL (ref 0.44–1.00)
GFR calc Af Amer: >60 mL/min (ref >60)
GFR calc non Af Amer: >60 mL/min (ref >60)
Glucose, Bld: 95 mg/dL (ref 70–99)
Potassium: 3.2 mmol/L — ABNORMAL LOW (ref 3.5–5.1)
Sodium: 138 mmol/L (ref 135–145)
Total Bilirubin: 0.7 mg/dL (ref 0.3–1.2)
Total Protein: 6.8 g/dL (ref 6.5–8.1)

## 2018-07-19 LAB — HIV ANTIBODY (ROUTINE TESTING W REFLEX): HIV Screen 4th Generation wRfx: NONREACTIVE

## 2018-07-19 LAB — I-STAT CREATININE, ED: Creatinine, Ser: 0.8 mg/dL (ref 0.44–1.00)

## 2018-07-19 MED ORDER — NICOTINE 14 MG/24HR TD PT24
14.0000 mg | MEDICATED_PATCH | Freq: Every day | TRANSDERMAL | Status: DC
  Administered 2018-07-19 – 2018-07-20 (×2): 14 mg via TRANSDERMAL
  Filled 2018-07-19 (×2): qty 1

## 2018-07-19 MED ORDER — METHYLPREDNISOLONE SODIUM SUCC 125 MG IJ SOLR
125.0000 mg | Freq: Once | INTRAMUSCULAR | Status: AC
  Administered 2018-07-19: 05:00:00 125 mg via INTRAVENOUS
  Filled 2018-07-19: qty 2

## 2018-07-19 MED ORDER — NITROGLYCERIN 0.4 MG SL SUBL: 0.4000 mg | SUBLINGUAL_TABLET | SUBLINGUAL | Status: DC | PRN

## 2018-07-19 MED ORDER — FENTANYL CITRATE (PF) 100 MCG/2ML IJ SOLN
50.0000 ug | Freq: Once | INTRAMUSCULAR | Status: AC
  Administered 2018-07-19: 05:00:00 50 ug via INTRAVENOUS
  Filled 2018-07-19: qty 2

## 2018-07-19 MED ORDER — ESCITALOPRAM OXALATE 20 MG PO TABS
20.0000 mg | ORAL_TABLET | Freq: Every day | ORAL | Status: DC
  Administered 2018-07-19 – 2018-07-20 (×2): 20 mg via ORAL
  Filled 2018-07-19: qty 2
  Filled 2018-07-19: qty 1

## 2018-07-19 MED ORDER — IOHEXOL 350 MG/ML SOLN
75.0000 mL | Freq: Once | INTRAVENOUS | Status: AC | PRN
  Administered 2018-07-19: 06:00:00 75 mL via INTRAVENOUS

## 2018-07-19 MED ORDER — GUAIFENESIN ER 600 MG PO TB12
600.0000 mg | ORAL_TABLET | Freq: Two times a day (BID) | ORAL | Status: DC
  Administered 2018-07-19 – 2018-07-20 (×3): 600 mg via ORAL
  Filled 2018-07-19 (×3): qty 1

## 2018-07-19 MED ORDER — FENTANYL CITRATE (PF) 100 MCG/2ML IJ SOLN
25.0000 ug | INTRAMUSCULAR | Status: DC | PRN
  Administered 2018-07-19: 09:00:00 25 ug via INTRAVENOUS
  Filled 2018-07-19: qty 2

## 2018-07-19 MED ORDER — ONDANSETRON HCL 4 MG/2ML IJ SOLN
4.0000 mg | Freq: Once | INTRAMUSCULAR | Status: AC
  Administered 2018-07-19: 05:00:00 4 mg via INTRAVENOUS
  Filled 2018-07-19: qty 2

## 2018-07-19 MED ORDER — ASPIRIN EC 325 MG PO TBEC
325.0000 mg | DELAYED_RELEASE_TABLET | Freq: Every day | ORAL | Status: DC
  Administered 2018-07-20: 325 mg via ORAL
  Filled 2018-07-19: qty 1

## 2018-07-19 MED ORDER — BACLOFEN 20 MG PO TABS
20.0000 mg | ORAL_TABLET | Freq: Three times a day (TID) | ORAL | Status: DC | PRN
  Filled 2018-07-19: qty 1

## 2018-07-19 MED ORDER — ALUM & MAG HYDROXIDE-SIMETH 200-200-20 MG/5ML PO SUSP: 30.0000 mL | Freq: Four times a day (QID) | ORAL | Status: DC | PRN

## 2018-07-19 MED ORDER — FENTANYL CITRATE (PF) 100 MCG/2ML IJ SOLN: 50.0000 ug | Freq: Once | INTRAMUSCULAR | Status: DC

## 2018-07-19 MED ORDER — ONDANSETRON HCL 4 MG/2ML IJ SOLN: 4.0000 mg | Freq: Four times a day (QID) | INTRAMUSCULAR | Status: DC | PRN

## 2018-07-19 MED ORDER — LEVETIRACETAM 500 MG PO TABS
500.0000 mg | ORAL_TABLET | Freq: Two times a day (BID) | ORAL | Status: DC
  Administered 2018-07-19 – 2018-07-20 (×4): 500 mg via ORAL
  Filled 2018-07-19 (×3): qty 1

## 2018-07-19 MED ORDER — MAGNESIUM SULFATE 2 GM/50ML IV SOLN
2.0000 g | Freq: Once | INTRAVENOUS | Status: AC
  Administered 2018-07-19: 2 g via INTRAVENOUS
  Filled 2018-07-19: qty 50

## 2018-07-19 MED ORDER — ACETAMINOPHEN 325 MG PO TABS: 650.0000 mg | ORAL_TABLET | ORAL | Status: DC | PRN

## 2018-07-19 MED ORDER — ALBUTEROL SULFATE (2.5 MG/3ML) 0.083% IN NEBU
2.5000 mg | INHALATION_SOLUTION | Freq: Four times a day (QID) | RESPIRATORY_TRACT | Status: DC
  Administered 2018-07-19 (×2): 2.5 mg via RESPIRATORY_TRACT
  Filled 2018-07-19 (×2): qty 3

## 2018-07-19 MED ORDER — ENOXAPARIN SODIUM 40 MG/0.4ML ~~LOC~~ SOLN
40.0000 mg | SUBCUTANEOUS | Status: DC
  Administered 2018-07-19 – 2018-07-20 (×2): 40 mg via SUBCUTANEOUS
  Filled 2018-07-19 (×2): qty 0.4

## 2018-07-19 MED ORDER — PRAMIPEXOLE DIHYDROCHLORIDE 0.25 MG PO TABS: 0.5000 mg | ORAL_TABLET | Freq: Every evening | ORAL | Status: DC | PRN

## 2018-07-19 MED ORDER — ALBUTEROL SULFATE HFA 108 (90 BASE) MCG/ACT IN AERS
8.0000 | INHALATION_SPRAY | Freq: Once | RESPIRATORY_TRACT | Status: AC
  Administered 2018-07-19: 8 via RESPIRATORY_TRACT
  Filled 2018-07-19: qty 6.7

## 2018-07-19 MED ORDER — HYDROCHLOROTHIAZIDE 12.5 MG PO CAPS
12.5000 mg | ORAL_CAPSULE | Freq: Every day | ORAL | Status: DC
  Administered 2018-07-19 – 2018-07-20 (×2): 12.5 mg via ORAL
  Filled 2018-07-19 (×2): qty 1

## 2018-07-19 MED ORDER — POTASSIUM CHLORIDE CRYS ER 20 MEQ PO TBCR
40.0000 meq | EXTENDED_RELEASE_TABLET | ORAL | Status: AC
  Administered 2018-07-19: 08:00:00 40 meq via ORAL
  Filled 2018-07-19: qty 2

## 2018-07-19 MED ORDER — ALBUTEROL SULFATE HFA 108 (90 BASE) MCG/ACT IN AERS
8.0000 | INHALATION_SPRAY | Freq: Once | RESPIRATORY_TRACT | Status: AC
  Administered 2018-07-19: 05:00:00 8 via RESPIRATORY_TRACT
  Filled 2018-07-19: qty 6.7

## 2018-07-19 MED ORDER — TEMAZEPAM 15 MG PO CAPS
15.0000 mg | ORAL_CAPSULE | Freq: Every evening | ORAL | Status: DC | PRN
  Administered 2018-07-19: 22:00:00 15 mg via ORAL
  Filled 2018-07-19: qty 1

## 2018-07-19 MED ORDER — ALBUTEROL SULFATE (2.5 MG/3ML) 0.083% IN NEBU
5.0000 mg | INHALATION_SOLUTION | Freq: Once | RESPIRATORY_TRACT | Status: AC
  Administered 2018-07-19: 08:00:00 5 mg via RESPIRATORY_TRACT
  Filled 2018-07-19: qty 6

## 2018-07-19 MED ORDER — VILAZODONE HCL 20 MG PO TABS
40.0000 mg | ORAL_TABLET | Freq: Every day | ORAL | Status: DC
  Administered 2018-07-20: 11:00:00 40 mg via ORAL
  Filled 2018-07-19 (×2): qty 2

## 2018-07-19 NOTE — ED Triage Notes (Addendum)
Pt arrives via [facility] from home for c/o L sided chest pain that is reproducible with palpation as well as pain with inspiration that awoke her from sleep this am. pts initial O2 sat was 89%, improved to 100% on 6L. Denies fever, chills, cough, body aches, recent travel or any sick contacts. Pt a/ox4.  Pt received 324mg  asa pta.

## 2018-07-19 NOTE — ED Notes (Signed)
Patient assisted to call her daughter

## 2018-07-19 NOTE — Progress Notes (Signed)
  Echocardiogram 2D Echocardiogram has been performed.  Kirstie Larsen G Verity Gilcrest 07/19/2018, 3:10 PM

## 2018-07-19 NOTE — Consult Note (Addendum)
The patient has been seen in conjunction with Sande Rives, PA-C. All aspects of care have been considered and discussed. The patient has been personally interviewed, examined, and all clinical data has been reviewed.   Suspect costochondritis which can be treated with NSAID.  Point tenderness in upper left parasternal region.  If echo is unremarkable and AM ECG reveals no evolution, no further w/u will be recommended.   Cardiology Consult    Patient ID: TASHAI CATINO MRN: [id number], DOB/AGE: [birth year]   Admit date: 07/19/2018 Date of Consult: 07/19/2018  Primary Physician: Hoyt Koch, MD Primary Cardiologist: Previously seen by multiple [facility] providers during prior admissions. Last seen in 2014. No office visits with [place]. Requesting Provider: Fuller Plan, MD  Patient Profile    ANITTA TENNY is a 63 y.o. female with a history of normal coronaries on cardiac catheterization in 05/2011 and negative Myoview in 05/2012, stroke treated with TPA in 04/2011, breast cancer s/p bilateral mastectomy with recostruction in the 1980's, seizures, hypertension, cancer, and tobacco abuse, who is being seen today for the evaluation of chest pain at the request of Dr. Tamala Julian.  History of Present Illness     Ms. Consoli is a 63 year old female with the above history. Patient was admitted in 05/2011 for substernal chest pain and associated shortness of breath and syncope. EKG showed new T wave inversions at that time. Patient underwent cardiac catheterization which showed normal coronaries. Patient admitted again in 05/2012 for altered mental status with urinary and bowel incontinence. While in the ED, she had a generalized tonic-clonic seizure. Cardiology consulted at that time for elevated troponin. Echocardiogram at that time showed LVEF of 60-65% with grade 1 diastolic dysfunction but no regional wall motion abnormalities. Elevated troponin was felt to be stress mediated. Outpatient Myoview was  arranged which came back normal. Patient has not been seen by Cardiology since that time. Most recent Echo in 08/2013 following a TIA showed was unchanged.   Patient presents to the ED today for further evaluation of chest pain. Patient in her usual state of health until this morning when she was woken up with sudden onset of left sided chest pain that radiated to her back. Patient describes that pain as a dull pressure and ranks it as a 10/10 on the pain scale. She notes associated shortness of breath and some sweating but denies any nausea, vomiting, or palpitations. Pain worse with deep inspiration. Patient unsure if pain is worse with any specific position changes. Patient was afraid that she may be having a heart attack so she called 911 and then called her daughter. Patient thinks she may have passed out because she does not remember anything following calling her daughter until waking with EMS around. Patient denies any recent chest pain at rest or with exertion. She reports mild shortness with activities like walking up the step but states it is not bad enough to the point where she has to stop and catch her breath. No orthopnea, PND, or edema. No recent fevers, chills, illnesses, or cough. Patient's daughter was recently diagnosed with strep throat but no other sick contacts or exposure to the coronavirus.   In the ED: Initial O2 sats of 89% which improved to 100% on 6L via nasal cannula. Vitals otherwise stable. EKG showed non-specific ST/T changes but no acute ischemic changes compared to prior tracings. Initial troponin negative. Chest x-ray showed borderline cardiomegaly with no acute disease. Chest CTA showed no evidence of pulmonary embolus  but CAD with calcifications in the LAD and enlarged pulmonary arteries suggestive of pulmonary arterial hypertension. WBC 9.7, Hgb 3.99, Plts 151. Na 138, K 3.2, Glucose 95, SCr 0.83. LFTs normal. COVID-testing negative. Patient given albuterol, Solu-Medrol,  Fentanyl, and IV Magnesium in the ED. Patient states chest pain completely resolved after medicine given in the ED. She was admitted for further evaluation.   At the time of this evaluation, patient is chest pain free and feels like her breathing has improved.   Patient has a 40+ year smoking history but states she has been cutting back a lot. She currently smokes 1 pack of cigarettes every 3 days. When asked at her most how much did she smoke, she states "a lot" but could not specify. She reports some memory issues following her stroke. She reports drinking 1 glass of wine every night but denies any recreational drug use. She does have a family his of heart disease with one of her brothers having CAD requiring a stent. Of note, both brothers committed suicide, one within the last 1-2 years.  Past Medical History   Past Medical History:  Diagnosis Date   Angina    Breast cyst    Cancer ([place])    Depression    Hypertension    RLS (restless legs syndrome) 11/16/2011   Seizures ([place]) 2016   last seizure="last year, 2016" per pt.   Stroke ([place]) 04/20/2011   a. 04/20/11 Left subcortical infarct treated w/ TPA;  b. 04/2011 Echo: EF 55-60%;  c. 04/2011 Normal Carotid u/s  d. Residual "walk w/limp on right; unable to grasp w/right hand"   TIA (transient ischemic attack) 08/2010   Tobacco abuse    a. quit @ time of CVA 04/2011.    Past Surgical History:  Procedure Laterality Date   BREAST RECONSTRUCTION     bilaterally   CARPAL TUNNEL RELEASE Left 1990's   CARPAL TUNNEL RELEASE Left 12/08/2015   CESAREAN SECTION  1979   KNEE ARTHROSCOPY Left 1990's    cartilage repair   LEFT HEART CATHETERIZATION WITH CORONARY ANGIOGRAM N/A 05/20/2011   Procedure: LEFT HEART CATHETERIZATION WITH CORONARY ANGIOGRAM;  Surgeon: Josue Hector, MD;  Location: [facility] CATH LAB;  Service: Cardiovascular;  Laterality: N/A;   MASTECTOMY Bilateral 1980's   bilateral with reconstruction      Allergies  Allergies  Allergen Reactions   Codeine Itching    Inpatient Medications     albuterol  2.5 mg Nebulization QID   [START ON 07/20/2018] aspirin EC  325 mg Oral Daily   enoxaparin (LOVENOX) injection  40 mg Subcutaneous Q24H   escitalopram  20 mg Oral Daily   fentaNYL (SUBLIMAZE) injection  50 mcg Intravenous Once   guaiFENesin  600 mg Oral BID   hydrochlorothiazide  12.5 mg Oral Daily   levETIRAcetam  500 mg Oral BID   nicotine  14 mg Transdermal Daily   Vilazodone HCl  40 mg Oral Daily    Family History    Family History  Problem Relation Age of Onset   Lupus Mother        died early 24's.   Emphysema Father        died late 52's.   COPD Father    Pancreatic cancer Brother    Bladder Cancer Brother    Rectal cancer Brother    Suicidality Brother    Colon cancer Neg Hx    She indicated that her mother is deceased. She indicated that her father is deceased.  She indicated that her sister is alive. She indicated that three of her five brothers are alive. She indicated that the status of her neg hx is unknown.   Social History    Social History   Socioeconomic History   Marital status: Widowed    Spouse name: Not on file   Number of children: 1   Years of education: 12   Highest education level: Not on file  Occupational History   Occupation: disabled  Social Designer, fashion/clothing strain: Not on file   Food insecurity    Worry: Not on file    Inability: Not on file   Transportation needs    Medical: Not on file    Non-medical: Not on file  Tobacco Use   Smoking status: Current Every Day Smoker    Packs/day: 0.50    Years: 37.00    Pack years: 18.50    Types: Cigarettes   Smokeless tobacco: Never Used  Substance and Sexual Activity   Alcohol use: Yes    Alcohol/week: 7.0 standard drinks    Types: 7 Glasses of wine per week    Comment: very occasional   Drug use: No   Sexual activity: Never   Lifestyle   Physical activity    Days per week: Not on file    Minutes per session: Not on file   Stress: Not on file  Relationships   Social connections    Talks on phone: Not on file    Gets together: Not on file    Attends religious service: Not on file    Active member of club or organization: Not on file    Attends meetings of clubs or organizations: Not on file    Relationship status: Not on file   Intimate partner violence    Fear of current or ex partner: Not on file    Emotionally abused: Not on file    Physically abused: Not on file    Forced sexual activity: Not on file  Other Topics Concern   Not on file  Social History Narrative   Pt lives alone.   Caffeine Use: 1-3 cups of caffeine daily (tea)     Review of Systems    Review of Systems  Constitutional: Negative for chills and fever.  HENT: Negative for congestion.   Respiratory: Positive for shortness of breath. Negative for cough and hemoptysis.   Cardiovascular: Positive for chest pain. Negative for palpitations, orthopnea, leg swelling and PND.  Gastrointestinal: Negative for abdominal pain, blood in stool, nausea and vomiting.  Genitourinary: Negative for hematuria.  Musculoskeletal: Positive for back pain. Negative for myalgias.  Neurological: Negative for dizziness. Loss of consciousness: questionable.  Endo/Heme/Allergies: Does not bruise/bleed easily.  Psychiatric/Behavioral: Positive for memory loss (memory problems following stroke) and substance abuse.    Physical Exam    Blood pressure 107/71, pulse 69, temperature 99.4 F (37.4 C), temperature source Rectal, resp. rate 11, SpO2 92 %.  General: 63 y.o. female resting comfortably in no acute distress. Pleasant and cooperative. Currently on supplemental O2 via nasal cannula. HEENT: Normal  Neck: Supple. No carotid bruits. Lungs: No increased work of breathing. Somewhat decreased breath sound but lungs mostly clear to auscultation. No  significant wheezes, rhonchi, or rales.  Heart: RRR. Distinct S1 and S2. No murmurs, gallops, or rubs. Pinpoint tenderness to palpation of chest wall to the left of sternum. Abdomen: Soft, non-distended, and non-tender to palpation. Bowel sounds present in all 4 quadrants.  Extremities: No clubbing, cyanosis or edema. Radial pulses 2+ and equal bilaterally. Skin: Warm and dry. Neuro: Alert and oriented x3. No focal deficits. Moves all extremities spontaneously. Psych: Normal affect. Responds appropriately.  Labs    Troponin (Point of Care Test) No results for input(s): TROPIPOC in the last 72 hours. Recent Labs    07/19/18 0509 07/19/18 0829  TROPONINI <0.03 <0.03   <0.03   Lab Results  Component Value Date   WBC 9.7 07/19/2018   HGB 13.0 07/19/2018   HCT 38.4 07/19/2018   MCV 96.2 07/19/2018   PLT 151 07/19/2018    Recent Labs  Lab 07/19/18 0509 07/19/18 0532  NA 138  --   K 3.2*  --   CL 100  --   CO2 28  --   BUN 12  --   CREATININE 0.83 0.80  CALCIUM 8.9  --   PROT 6.8  --   BILITOT 0.7  --   ALKPHOS 61  --   ALT 17  --   AST 24  --   GLUCOSE 95  --    Lab Results  Component Value Date   CHOL 99 07/19/2018   HDL 52 07/19/2018   LDLCALC 23 07/19/2018   TRIG 119 07/19/2018   No results found for: [facility]   Radiology Studies    Ct Angio Chest Pe W And/or Wo Contrast  Result Date: 07/19/2018 CLINICAL DATA:  Left-sided chest pain reproduced with inspiration. PE suspected. High pretest probability. EXAM: CT ANGIOGRAPHY CHEST WITH CONTRAST TECHNIQUE: Multidetector CT imaging of the chest was performed using the standard protocol during bolus administration of intravenous contrast. Multiplanar CT image reconstructions and MIPs were obtained to evaluate the vascular anatomy. CONTRAST:  34mL OMNIPAQUE IOHEXOL 350 MG/ML SOLN COMPARISON:  One-view chest x-ray 07/19/2018.  CTA chest 08/17/2013 FINDINGS: Cardiovascular: Heart is enlarged. Coronary artery calcifications  are evident in the LAD. No significant pericardial effusion is present. Atherosclerotic changes are noted at the aorta. There is no aneurysm. No significant stenosis is present at the origins of the great vessels. Pulmonary arteries are enlarged. Right main pulmonary artery measures 2.6 cm. Main pulmonary trunk measures 3 point 5 cm. There are no focal filling defects to suggest pulmonary embolus. Mediastinum/Nodes: No significant mediastinal, hilar, or axillary adenopathy is present. Thoracic inlet is normal. Esophagus is within normal limits. Lungs/Pleura: Mild dependent atelectasis is present. No focal nodule, mass, or airspace disease is present. There is no significant pleural effusion or pneumothorax. Upper Abdomen: Within normal limits. Musculoskeletal: Vertebral body heights and alignment are maintained. Mild rightward curvature is again noted. Ribs are unremarkable. Review of the MIP images confirms the above findings. IMPRESSION: 1. No pulmonary embolus. 2. Enlarged pulmonary arteries suggest pulmonary arterial hypertension. 3. Cardiomegaly without failure. 4.  Aortic Atherosclerosis (ICD10-I70.0). 5. Coronary artery disease with calcifications in the LAD. Electronically Signed   By: San Morelle M.D.   On: 07/19/2018 06:29   Dg Chest Portable 1 View  Result Date: 07/19/2018 CLINICAL DATA:  Left-sided chest pain. Pain woke her from sleep today. Shortness of breath. EXAM: PORTABLE CHEST 1 VIEW COMPARISON:  Two-view chest x-ray 02/14/2018 FINDINGS: Heart is mildly enlarged. Atherosclerotic changes are noted at the aortic arch. There is no edema or effusion. No focal airspace disease is present. Visualized soft tissues and bony thorax are unremarkable. IMPRESSION: 1. Borderline cardiomegaly without failure. 2. No acute cardiopulmonary disease. Electronically Signed   By: San Morelle M.D.   On: 07/19/2018 05:52  EKG     EKG: EKG was personally reviewed and demonstrates: Normal sinus  rhythm, rate 70 bpm, with LAFB and poor R wave progression and known T wave inversion in leads anterior leads. Otherwise, non-specific ST/T changes but no acute ischemic changes compared to prior tracings.   Telemetry: Telemetry was personally reviewed and demonstrates: Normal sinus rhythm with heart rates in the 60's to 80's and occasional PVCs.  Cardiac Imaging    Echocardiogram 08/17/2013: Study Conclusions: - Left ventricle: The cavity size was normal. There was mild  concentric hypertrophy. Systolic function was normal. The  estimated ejection fraction was in the range of 60% to 65%. Wall  motion was normal; there were no regional wall motion  abnormalities. Doppler parameters are consistent with abnormal  left ventricular relaxation (grade 1 diastolic dysfunction).   Impressions:  - No cardiac source of emboli was indentified.  _______________  Myoview 05/29/2012: Normal myoview. _______________  Cardiac Catheterization 05/20/2011: Impression: No significant CAD. EF 65%. No RWMAs. Tolerated procedure well. Plan per primary care.  Assessment & Plan    Atypical Chest Pain - Patient presents with chest pain that woke her up from sleep. Pain worse with deep inspiration and reproducible on exam. Patient had normal coronaries on cardiac catheterization in 2013 and negative Myoview in 2014.  - EKG shows known T wave inversion in anterior leads but no acute ischemic changes compared to prior tracings. - Troponin negative x3. - Will repeat EKG in the morning. - Chest CTA negative for PE but did note CAD with calcification in the LAD. - Echo pending.  - Presentation very atypical. Possibly musculoskeletal given chest wall tenderness on exam. However, patient does have multiple cardiovascular risk factors including hypertension, CVA, and significant tobacco abuse. Patient currently chest pain free. If Echo is normal, suspect we can follow-up as outpatient with Myoview or  coronary CT. Will discuss with MD.  Questionable Syncope - Patient states she thinks she passed out after calling 911 and her daughter because she doesn't remember anything following those calls until arrival of EMS. No mention of this episode in ED note or H&P. Low suspicion that this was a true syncopal event. - Telemetry shows sinus rhythm with rates in the 60's to 80's. No arrhythmias noted. - Echo pending.  Hypertension - BP currently well controlled.  - Continue home HCTZ 12.5mg  daily.  Hypokalemia - Potassium 3.2 on admission. Received one dose of K-Dur 38mEq. - Recheck BMET tomorrow morning.  Acute Respiratory Distress - Felt to be secondary to acute bronchitis. Chest x-ray showed no acute findings. - Management per primary team. - Management per primary team.  Tobacco Abuse - Patient has 40+ year smoking history. She states she has been cutting back a lot lately and currently smokes 1 pack every 3 days. - Complete cessation advised.   Otherwise, per primary team: - Depression - Seizure disorder - History of CVA - History of breast cancer  Signed, Darreld Mclean, PA-C 07/19/2018, 1:19 PM Pager: [phone] For questions or updates, please contact   Please consult [email or web address] for contact info under Cardiology/STEMI.

## 2018-07-19 NOTE — ED Notes (Signed)
This RN acting as Art therapist and asked pt if she would like for me to call any family/friends. Pt declined and states she is able to call.

## 2018-07-19 NOTE — ED Provider Notes (Signed)
TIME SEEN: 5:00 AM  CHIEF COMPLAINT: Chest pain, shortness of breath  HPI: Patient is a 63 year old female with history of hypertension, previous stroke, tobacco use, breast cancer status post bilateral mastectomy, seizures on Keppra who presents to the emergency department with complaints of chest pain and shortness of breath that woke her from sleep just prior to arrival.  She came in by EMS.  Sats found to be 89% on room air with EMS.  She is not on oxygen chronically.  Denies history of asthma or COPD.  States she went to bed tonight and felt fine.  Chest pain is a pressure, tightness that is worse with deep inspiration and palpation.  No nausea, vomiting, diarrhea.  No fevers or cough.  Has been around her grandson who recently tested positive for strep throat.  Does not think she has been exposed to coronavirus.  Denies history of PE or DVT.  No known history of CAD.  No lower extremity swelling or pain.  No injury to her chest.  No increased exertion.  ROS: See HPI Constitutional: no fever  Eyes: no drainage  ENT: no runny nose   Cardiovascular:  chest pain  Resp:  SOB  GI: no vomiting GU: no dysuria Integumentary: no rash  Allergy: no hives  Musculoskeletal: no leg swelling  Neurological: no slurred speech ROS otherwise negative  PAST MEDICAL HISTORY/PAST SURGICAL HISTORY:  Past Medical History:  Diagnosis Date  . Angina   . Breast cyst   . Cancer ([place])   . Depression   . Hypertension   . RLS (restless legs syndrome) 11/16/2011  . Seizures ([place]) 2016   last seizure="last year, 2016" per pt.  . Stroke ([place]) 04/20/2011   a. 04/20/11 Left subcortical infarct treated w/ TPA;  b. 04/2011 Echo: EF 55-60%;  c. 04/2011 Normal Carotid u/s  d. Residual "walk w/limp on right; unable to grasp w/right hand"  . TIA (transient ischemic attack) 08/2010  . Tobacco abuse    a. quit @ time of CVA 04/2011.    MEDICATIONS:  Prior to Admission medications   Medication Sig Start Date End Date  Taking? Authorizing Provider  albuterol (PROVENTIL HFA;VENTOLIN HFA) 108 (90 Base) MCG/ACT inhaler Inhale 2 puffs into the lungs every 6 (six) hours as needed for wheezing or shortness of breath. 12/28/16   Hoyt Koch, MD  alosetron (LOTRONEX) 0.5 MG tablet Take 1 tablet (0.5 mg total) by mouth 2 (two) times daily as needed. cramping 11/02/16   Margarita Mail, PA-C  aspirin EC 325 MG tablet Take 325 mg by mouth daily.    [provider]  atorvastatin (LIPITOR) 80 MG tablet Take 1 tablet (80 mg total) by mouth daily. Need visit before next refill 06/16/18   Hoyt Koch, MD  baclofen (LIORESAL) 20 MG tablet TAKE 1/2 (ONE-HALF) TABLET BY MOUTH THREE TIMES DAILY FOR 7 DAYS AND THEN  1 TABLET THREE TIMES DAILY 05/01/18   Hoyt Koch, MD  diclofenac sodium (VOLTAREN) 1 % GEL Apply 2 g topically 4 (four) times daily. 12/20/13   Hoyt Koch, MD  escitalopram (LEXAPRO) 20 MG tablet Take 1 tablet by mouth once daily 06/21/18   Binnie Rail, MD  hydrochlorothiazide (MICROZIDE) 12.5 MG capsule Take 1 capsule (12.5 mg total) by mouth daily. 04/07/18   Hoyt Koch, MD  ibuprofen (ADVIL,MOTRIN) 800 MG tablet Take 1 tablet by mouth every 8 (eight) hours as needed for moderate pain.  11/13/15   [provider]  levETIRAcetam (KEPPRA) 500 MG tablet Take 1 tablet (500 mg total) by mouth 2 (two) times daily. 11/25/17   Garvin Fila, MD  levocetirizine (XYZAL) 5 MG tablet TAKE 1 TABLET BY MOUTH ONCE DAILY IN THE EVENING 09/27/16   Hoyt Koch, MD  Multiple Vitamin (MULTIVITAMIN) capsule Take 1 capsule by mouth daily.     [provider]  ondansetron (ZOFRAN) 4 MG tablet Take 1 tablet (4 mg total) by mouth every 8 (eight) hours as needed for nausea or vomiting. 11/02/16   Harris, Abigail, PA-C  ondansetron (ZOFRAN-ODT) 8 MG disintegrating tablet Take 1 tablet (8 mg total) by mouth every 8 (eight) hours as needed for nausea or vomiting.  02/13/18   Janith Lima, MD  pramipexole (MIRAPEX) 0.5 MG tablet Take 1 tablet (0.5 mg total) by mouth at bedtime as needed. 04/26/17   Hoyt Koch, MD  ranitidine (ZANTAC) 300 MG capsule Take 1 capsule (300 mg total) by mouth every evening. 10/07/15   Plotnikov, Evie Lacks, MD  temazepam (RESTORIL) 15 MG capsule Take 1-2 capsules (15-30 mg total) by mouth at bedtime as needed for sleep. 05/29/18   Hoyt Koch, MD  VIIBRYD 40 MG TABS Take 1 tablet by mouth once daily 06/01/18   Hoyt Koch, MD    ALLERGIES:  Allergies  Allergen Reactions  . Codeine Itching    SOCIAL HISTORY:  Social History   Tobacco Use  . Smoking status: Current Every Day Smoker    Packs/day: 0.50    Years: 37.00    Pack years: 18.50    Types: Cigarettes  . Smokeless tobacco: Never Used  Substance Use Topics  . Alcohol use: Yes    Alcohol/week: 7.0 standard drinks    Types: 7 Glasses of wine per week    Comment: very occasional    FAMILY HISTORY: Family History  Problem Relation Age of Onset  . Lupus Mother        died early 46's.  . Emphysema Father        died late 41's.  [name] COPD Father   . Pancreatic cancer Brother   . Bladder Cancer Brother   . Rectal cancer Brother   . Suicidality Brother   . Colon cancer Neg Hx     EXAM: BP 140/77   Pulse 69   Temp 98.9 F (37.2 C) (Oral)   Resp 15   SpO2 95%  CONSTITUTIONAL: Alert and oriented and responds appropriately to questions.  Chronically ill-appearing, appears older than stated age HEAD: Normocephalic EYES: Conjunctivae clear, pupils appear equal, EOMI ENT: normal nose; moist mucous membranes NECK: Supple, no meningismus, no nuchal rigidity, no LAD  CARD: RRR; S1 and S2 appreciated; no murmurs, no clicks, no rubs, no gallops RESP: Patient is splinting.  She appears uncomfortable.  She does have some mild scattered wheezes on exam with diminished aeration.  No rhonchi or rales.  Unable to take a big deep breath.  She  is speaking full sentences.  She is hypoxic on room air. ABD/GI: Normal bowel sounds; non-distended; soft, non-tender, no rebound, no guarding, no peritoneal signs, no hepatosplenomegaly BACK:  The back appears normal and is non-tender to palpation, there is no CVA tenderness EXT: Normal ROM in all joints; non-tender to palpation; no edema; normal capillary refill; no cyanosis, no calf tenderness or swelling    SKIN: Normal color for age and race; warm; no rash NEURO: Moves all extremities equally PSYCH: The patient's mood and manner are appropriate.  Grooming and personal hygiene are appropriate.  MEDICAL DECISION MAKING: Patient here with chest pain and shortness of breath.  Differential includes ACS, COPD, COVID-19, pneumonia, pneumothorax, PE.  No signs of volume overload.  She is hypoxic here and does not chronically wear oxygen.  Will give albuterol, Solu-Medrol for wheezing.  She has no documented history of COPD or asthma but is a smoker.  Will give pain medication.  Will obtain cardiac labs, chest x-ray.  I feel she is high risk for PE.  Will obtain CTA of her chest.  ED PROGRESS: Patient's labs are unremarkable.  Negative troponin.  CT chest shows no pulmonary embolus, pneumonia or edema.  Suspect COPD playing a role in her symptoms.  Some improvement in symptoms after albuterol.  Her COVID swab is negative.  Will give nebulizer treatment.  Will admit for serial troponins and breathing treatments.  PCP is with .  7:18 AM Discussed patient's case with hospitalist, Dr. Tamala Julian.  I have recommended admission and patient (and family if present) agree with this plan. Admitting physician will place admission orders.   I reviewed all nursing notes, vitals, pertinent previous records, EKGs, lab and urine results, imaging (as available).   One dose of IV fentanyl significantly improved patient's pain.  Will give second dose in ED to get chest pain free.   EKG  Interpretation  Date/Time:  Wednesday July 19 2018 04:57:50 EDT Ventricular Rate:  70 PR Interval:    QRS Duration: 89 QT Interval:  438 QTC Calculation: 473 R Axis:   -66 Text Interpretation:  Sinus rhythm Left anterior fascicular block Abnormal R-wave progression, late transition Nonspecific T abnormalities, anterior leads No significant change since last tracing Confirmed by , Cyril Mourning [phone]) on 07/19/2018 5:00:13 AM         , Delice Bison, DO 07/19/18 [id number]

## 2018-07-19 NOTE — Progress Notes (Signed)
Oxygen humidity provided per RRT.

## 2018-07-19 NOTE — H&P (Signed)
History and Physical    Anna Lyons IWL:[id number] DOB: 1956-01-15 DOA: 07/19/2018  Referring MD/NP/PA: Pryor Curia, MD PCP: Hoyt Koch, MD  Patient coming from: Home via EMS  Chief Complaint: Chest pain  I have personally briefly reviewed patient's old medical records in [place]   HPI: Anna Lyons is a 63 y.o. female with medical history significant of HTN, CVA/TIA, seizures, breast cancer s/p b/l mastectomy, depression, and depression; who presents with complaints of severe left-sided chest pain woke her up out of her sleep.  Notes that pain radiated into her back and was worse with deep inspiration and palpation.  Denies any diaphoresis, nausea, vomiting, abdominal pain, recent fall/trauma, heavy lifting, strenuous activityseizure activity, or incontinence of bowel/urine.  She states that she is never had chest pain like this before in the past and question if she was having a heart attack.  She felt like she was out of it during the time and called her daughter.  Patient also notes that it is been hot where she works and she has had some shortness of breath.  Also, complains of her urine being "hot" along with the around her grandson who recently tested positive for strep throat.  Denies any leg swelling, sore throat, cough, leg swelling or calf pain.  She admits to smoking a little bit less than half a pack cigarettes per day on average and has done so for quite some time.  Does not note a history of asthma or COPD, and has no access to any inhalers at home..   En route with EMS patient initial O2 saturations were noted to be 89% and improved to 100% on 6 L of nasal cannula oxygen.  She also the 324 mg of aspirin prior to arrival.  ED Course: Upon admission into the emergency department patient was noted to be afebrile (99.4 F), O2 saturations 91 to 96% on 2 L nasal cannula oxygen.  Lab work revealed potassium 3.2.  All other labs including CBC, CMP, and initial troponin  are unremarkable.  Chest x-ray showed borderline cardiomegaly without signs of failure.  Due to patient's symptoms CT angiogram of the chest was ordered.  Study showed no signs of PE, large pulmonary arteries to suggest pulmonary artery hypertension, cardiomegaly without failure, aortic atherosclerosis, and coronary artery calcifications in the LAD.  EKG with T wave inversions in the anterior leads.  Patient was given multiple doses of albuterol, fentanyl, 125 mg of Solu-Medrol, and 2 g of magnesium sulfate.  TRH called to admit for suspect COPD exacerbation with A. fib chest pain.  Review of Systems  Constitutional: Negative for chills, diaphoresis, fever and malaise/fatigue.  HENT: Negative for ear discharge and nosebleeds.   Eyes: Negative for photophobia and pain.  Respiratory: Positive for shortness of breath. Negative for cough.   Cardiovascular: Positive for chest pain. Negative for leg swelling.  Gastrointestinal: Negative for abdominal pain, diarrhea, nausea and vomiting.  Genitourinary: Positive for dysuria. Negative for hematuria.  Musculoskeletal: Positive for myalgias. Negative for falls.  Skin: Negative for rash.  Neurological: Negative for focal weakness and loss of consciousness.  Endo/Heme/Allergies: Does not bruise/bleed easily.  Psychiatric/Behavioral: Negative for memory loss and suicidal ideas.    Past Medical History:  Diagnosis Date  . Angina   . Breast cyst   . Cancer ([place])   . Depression   . Hypertension   . RLS (restless legs syndrome) 11/16/2011  . Seizures ([place]) 2016   last seizure="last year, 2016"  per pt.  . Stroke ([place]) 04/20/2011   a. 04/20/11 Left subcortical infarct treated w/ TPA;  b. 04/2011 Echo: EF 55-60%;  c. 04/2011 Normal Carotid u/s  d. Residual "walk w/limp on right; unable to grasp w/right hand"  . TIA (transient ischemic attack) 08/2010  . Tobacco abuse    a. quit @ time of CVA 04/2011.    Past Surgical History:  Procedure Laterality Date  .  BREAST RECONSTRUCTION     bilaterally  . CARPAL TUNNEL RELEASE Left 1990's  . CARPAL TUNNEL RELEASE Left 12/08/2015  . CESAREAN SECTION  1979  . KNEE ARTHROSCOPY Left 1990's    cartilage repair  . LEFT HEART CATHETERIZATION WITH CORONARY ANGIOGRAM N/A 05/20/2011   Procedure: LEFT HEART CATHETERIZATION WITH CORONARY ANGIOGRAM;  Surgeon: Josue Hector, MD;  Location: [facility] CATH LAB;  Service: Cardiovascular;  Laterality: N/A;  . MASTECTOMY Bilateral 1980's   bilateral with reconstruction     reports that she has been smoking cigarettes. She has a 18.50 pack-year smoking history. She has never used smokeless tobacco. She reports current alcohol use of about 7.0 standard drinks of alcohol per week. She reports that she does not use drugs.  Allergies  Allergen Reactions  . Codeine Itching    Family History  Problem Relation Age of Onset  . Lupus Mother        died early 47's.  . Emphysema Father        died late 39's.  [name] COPD Father   . Pancreatic cancer Brother   . Bladder Cancer Brother   . Rectal cancer Brother   . Suicidality Brother   . Colon cancer Neg Hx     Prior to Admission medications   Medication Sig Start Date End Date Taking? Authorizing Provider  aspirin EC 325 MG tablet Take 325 mg by mouth daily.   Yes [provider]  baclofen (LIORESAL) 20 MG tablet TAKE 1/2 (ONE-HALF) TABLET BY MOUTH THREE TIMES DAILY FOR 7 DAYS AND THEN  1 TABLET THREE TIMES DAILY Patient taking differently: Take 20 mg by mouth 3 (three) times daily.  05/01/18  Yes Hoyt Koch, MD  escitalopram (LEXAPRO) 20 MG tablet Take 1 tablet by mouth once daily Patient taking differently: Take 20 mg by mouth daily.  06/21/18  Yes Burns, Claudina Lick, MD  hydrochlorothiazide (MICROZIDE) 12.5 MG capsule Take 1 capsule (12.5 mg total) by mouth daily. 04/07/18  Yes Hoyt Koch, MD  ibuprofen (ADVIL,MOTRIN) 800 MG tablet Take 1 tablet by mouth every 8 (eight) hours as needed for moderate  pain.  11/13/15  Yes [provider]  levETIRAcetam (KEPPRA) 500 MG tablet Take 1 tablet (500 mg total) by mouth 2 (two) times daily. 11/25/17  Yes Garvin Fila, MD  Multiple Vitamin (MULTIVITAMIN WITH MINERALS) TABS tablet Take 1 tablet by mouth daily.   Yes [provider]  ondansetron (ZOFRAN) 4 MG tablet Take 1 tablet (4 mg total) by mouth every 8 (eight) hours as needed for nausea or vomiting. 11/02/16  Yes Harris, Abigail, PA-C  ondansetron (ZOFRAN-ODT) 8 MG disintegrating tablet Take 1 tablet (8 mg total) by mouth every 8 (eight) hours as needed for nausea or vomiting. 02/13/18  Yes Janith Lima, MD  pramipexole (MIRAPEX) 0.5 MG tablet Take 1 tablet (0.5 mg total) by mouth at bedtime as needed. Patient taking differently: Take 0.5 mg by mouth at bedtime as needed (for restless legs).  04/26/17  Yes Hoyt Koch, MD  temazepam (  RESTORIL) 15 MG capsule Take 1-2 capsules (15-30 mg total) by mouth at bedtime as needed for sleep. 05/29/18  Yes Hoyt Koch, MD  VIIBRYD 40 MG TABS Take 1 tablet by mouth once daily Patient taking differently: Take 40 mg by mouth daily.  06/01/18  Yes Hoyt Koch, MD  albuterol (PROVENTIL HFA;VENTOLIN HFA) 108 (90 Base) MCG/ACT inhaler Inhale 2 puffs into the lungs every 6 (six) hours as needed for wheezing or shortness of breath. Patient not taking: Reported on 07/19/2018 12/28/16   Hoyt Koch, MD  alosetron (LOTRONEX) 0.5 MG tablet Take 1 tablet (0.5 mg total) by mouth 2 (two) times daily as needed. cramping Patient not taking: Reported on 07/19/2018 11/02/16   Margarita Mail, PA-C  atorvastatin (LIPITOR) 80 MG tablet Take 1 tablet (80 mg total) by mouth daily. Need visit before next refill Patient not taking: Reported on 07/19/2018 06/16/18   Hoyt Koch, MD  diclofenac sodium (VOLTAREN) 1 % GEL Apply 2 g topically 4 (four) times daily. Patient not taking: Reported on 07/19/2018 12/20/13   Hoyt Koch, MD  levocetirizine (XYZAL) 5 MG tablet TAKE 1 TABLET BY MOUTH ONCE DAILY IN THE EVENING Patient not taking: Reported on 07/19/2018 09/27/16   Hoyt Koch, MD  ranitidine (ZANTAC) 300 MG capsule Take 1 capsule (300 mg total) by mouth every evening. Patient not taking: Reported on 07/19/2018 10/07/15   Plotnikov, Evie Lacks, MD    Physical Exam:  Constitutional: Elderly female who appears to be in no acute distress at this time. Vitals:   07/19/18 0615 07/19/18 0618 07/19/18 0630 07/19/18 0645  BP: 119/74  116/60 111/64  Pulse: 69 74 76 76  Resp: 14 13 19 12   Temp:      TempSrc:      SpO2: 95% 96% 93% 92%   Eyes: PERRL, lids and conjunctivae normal ENMT: Mucous membranes are moist. Posterior pharynx clear of any exudate or lesions.  Neck: normal, supple, no masses, no thyromegaly Respiratory: Normal expiratory effort with prolonged expiratory phase noted.  No significant wheezes or rhonchi noted.  Patient on 2 L nasal cannula oxygen maintaining O2 sats currently at this time. Cardiovascular: Regular rate and rhythm, no murmurs / rubs / gallops. No extremity edema. 2+ pedal pulses. No carotid bruits.  Tenderness to palpation on left side chest reproducible. Abdomen: no tenderness, no masses palpated. No hepatosplenomegaly. Bowel sounds positive.  Musculoskeletal: no clubbing / cyanosis. No joint deformity upper and lower extremities. Good ROM, no contractures. Normal muscle tone.  Skin: no rashes, lesions, ulcers. No induration Neurologic: CN 2-12 grossly intact. Sensation intact, DTR normal. Strength 5/5 in all 4.  Psychiatric: Normal judgment and insight. Alert and oriented x 3. Normal mood.     Labs on Admission: I have personally reviewed following labs and imaging studies  CBC: Recent Labs  Lab 07/19/18 0509  WBC 9.7  NEUTROABS 7.4  HGB 13.0  HCT 38.4  MCV 96.2  PLT 517   Basic Metabolic Panel: Recent Labs  Lab 07/19/18 0509 07/19/18 0532  NA 138   --   K 3.2*  --   CL 100  --   CO2 28  --   GLUCOSE 95  --   BUN 12  --   CREATININE 0.83 0.80  CALCIUM 8.9  --    GFR: CrCl cannot be calculated (Unknown ideal weight.). Liver Function Tests: Recent Labs  Lab 07/19/18 0509  AST 24  ALT 17  ALKPHOS 61  BILITOT 0.7  PROT 6.8  ALBUMIN 3.6   No results for input(s): LIPASE, AMYLASE in the last 168 hours. No results for input(s): AMMONIA in the last 168 hours. Coagulation Profile: No results for input(s): INR, PROTIME in the last 168 hours. Cardiac Enzymes: Recent Labs  Lab 07/19/18 0509  TROPONINI <0.03   BNP (last 3 results) No results for input(s): PROBNP in the last 8760 hours. HbA1C: No results for input(s): HGBA1C in the last 72 hours. CBG: No results for input(s): GLUCAP in the last 168 hours. Lipid Profile: No results for input(s): CHOL, HDL, LDLCALC, TRIG, CHOLHDL, LDLDIRECT in the last 72 hours. Thyroid Function Tests: No results for input(s): TSH, T4TOTAL, FREET4, T3FREE, THYROIDAB in the last 72 hours. Anemia Panel: No results for input(s): VITAMINB12, FOLATE, FERRITIN, TIBC, IRON, RETICCTPCT in the last 72 hours. Urine analysis:    Component Value Date/Time   COLORURINE YELLOW 02/14/2018 1704   APPEARANCEUR CLEAR 02/14/2018 1704   LABSPEC 1.009 02/14/2018 1704   PHURINE 8.0 02/14/2018 1704   GLUCOSEU NEGATIVE 02/14/2018 1704   HGBUR NEGATIVE 02/14/2018 1704   BILIRUBINUR NEGATIVE 02/14/2018 1704   KETONESUR NEGATIVE 02/14/2018 1704   PROTEINUR NEGATIVE 02/14/2018 1704   UROBILINOGEN 0.2 10/10/2014 1128   NITRITE NEGATIVE 02/14/2018 1704   LEUKOCYTESUR NEGATIVE 02/14/2018 1704   Sepsis Labs: Recent Results (from the past 240 hour(s))  SARS Coronavirus 2     Status: None   Collection Time: 07/19/18  5:33 AM  Result Value Ref Range Status   SARS Coronavirus 2 NOT DETECTED NOT DETECTED Final    Comment: (NOTE) SARS-CoV-2 target nucleic acids are NOT DETECTED. The SARS-CoV-2 RNA is generally  detectable in upper and lower respiratory specimens during the acute phase of infection.  Negative  results do not preclude SARS-CoV-2 infection, do not rule out co-infections with other pathogens, and should not be used as the sole basis for treatment or other patient management decisions.  Negative results must be combined with clinical observations, patient history, and epidemiological information. The expected result is Not Detected. Fact Sheet for Patients: http://www.biofiredefense.com/wp-content/uploads/2020/03/BIOFIRE-COVID -19-patients.pdf Fact Sheet for Healthcare Providers: http://www.biofiredefense.com/wp-content/uploads/2020/03/BIOFIRE-COVID -19-hcp.pdf This test is not yet approved or cleared by the [place] and  has been authorized for detection and/or diagnosis of SARS-CoV-2 by FDA under an Emergency Use Authorization (EUA).  This EUA will remain in effec t (meaning this test can be used) for the duration of  the COVID-19 declaration under Section 564(b)(1) of the Act, 21 U.S.C. section 360bbb-3(b)(1), unless the authorization is terminated or revoked sooner. Performed at [facility], [place] [address], [place], [place] [address]      Radiological Exams on Admission: Ct Angio Chest Pe W And/or Wo Contrast  Result Date: 07/19/2018 CLINICAL DATA:  Left-sided chest pain reproduced with inspiration. PE suspected. High pretest probability. EXAM: CT ANGIOGRAPHY CHEST WITH CONTRAST TECHNIQUE: Multidetector CT imaging of the chest was performed using the standard protocol during bolus administration of intravenous contrast. Multiplanar CT image reconstructions and MIPs were obtained to evaluate the vascular anatomy. CONTRAST:  19mL OMNIPAQUE IOHEXOL 350 MG/ML SOLN COMPARISON:  One-view chest x-ray 07/19/2018.  CTA chest 08/17/2013 FINDINGS: Cardiovascular: Heart is enlarged. Coronary artery calcifications are evident in the LAD. No significant pericardial effusion  is present. Atherosclerotic changes are noted at the aorta. There is no aneurysm. No significant stenosis is present at the origins of the great vessels. Pulmonary arteries are enlarged. Right main pulmonary artery measures 2.6 cm. Main pulmonary trunk measures  3 point 5 cm. There are no focal filling defects to suggest pulmonary embolus. Mediastinum/Nodes: No significant mediastinal, hilar, or axillary adenopathy is present. Thoracic inlet is normal. Esophagus is within normal limits. Lungs/Pleura: Mild dependent atelectasis is present. No focal nodule, mass, or airspace disease is present. There is no significant pleural effusion or pneumothorax. Upper Abdomen: Within normal limits. Musculoskeletal: Vertebral body heights and alignment are maintained. Mild rightward curvature is again noted. Ribs are unremarkable. Review of the MIP images confirms the above findings. IMPRESSION: 1. No pulmonary embolus. 2. Enlarged pulmonary arteries suggest pulmonary arterial hypertension. 3. Cardiomegaly without failure. 4.  Aortic Atherosclerosis (ICD10-I70.0). 5. Coronary artery disease with calcifications in the LAD. Electronically Signed   By: San Morelle M.D.   On: 07/19/2018 06:29   Dg Chest Portable 1 View  Result Date: 07/19/2018 CLINICAL DATA:  Left-sided chest pain. Pain woke her from sleep today. Shortness of breath. EXAM: PORTABLE CHEST 1 VIEW COMPARISON:  Two-view chest x-ray 02/14/2018 FINDINGS: Heart is mildly enlarged. Atherosclerotic changes are noted at the aortic arch. There is no edema or effusion. No focal airspace disease is present. Visualized soft tissues and bony thorax are unremarkable. IMPRESSION: 1. Borderline cardiomegaly without failure. 2. No acute cardiopulmonary disease. Electronically Signed   By: San Morelle M.D.   On: 07/19/2018 05:52    EKG: Independently reviewed.  Sinus rhythm at 70 bpm with LAFB and T wave inversions in anterior leads  Assessment/Plan Atypical  chest pain: Patient presents with complaints of left-sided chest pain.  On physical exam chest pain noted to be reproducible.  Initial troponin negative and EKG with T wave inversions in anterior leads. However, CT angiogram of the chest does note calcifications of the LAD and aorta with cardiomegaly.  Although based on physical exam appear musculoskeletal question likely underlying disease based off imaging. -Admit to a telemetry bed complaints of left-sided chest pain -Continue to trend troponin -Add on lipid panel -Check echocardiogram -Consulted cardiology, will follow for further recommendations  Acute respiratory distress secondary to acute bronchitis: Acute.  Patient noted to have O2 saturations as low as 89% on room air.  Chest x-ray shows no clear signs of infiltrate.  Initially noted to be wheezing, but currently physical exam only shows mildly prolonged expiration.  No previous PFTs available. -Nasal cannula oxygen as needed -Albuterol nebs 4 times daily -Mucinex -Consider prednisone taper, if wheezing noted to recur -May benefit from outpatient pulmonary function testing  Hypokalemia: Acute.  Initial potassium noted to be 3.2 on admission.  Patient on diuretics. -Give 40 mEq of potassium chloride x1 dose now -Continue to monitor and replace as needed  Essential hypertension: Stable.  Blood pressures range from 111/64 - 140/77. -Continue hydrochlorothiazide   Depression -Continue Lexapro and vilazodone  History of seizure disorder: Patient denies any recent seizure activity. -Continue Keppra  History of CVA/TIA: Patient with previous history of TIAs and remote left basal ganglion lacunar infarct.  Denies any residual deficits -Continue aspirin  History of breast cancer: Patient status post bilateral mastectomy.  Hyperlipidemia: Patient previously had been on Lipitor 80 mg daily. -Follow-up lipid panel  RLS: -Continue home regimen  Tobacco abuse: Admits to smoking half  a pack cigarettes per day on average. -Continue counseling on need of cessation of tobacco use -Nicotine patch offered DVT prophylaxis: Lovenox Code Status: Full Family Communication: No family present at bedside Disposition Plan: Likely discharge home in 1 to 2 days depending on work-up and evaluation of chest pain or shortness  of breath Consults called: Cardiology Admission status: Observation  Norval Morton MD [facility] Pager [phone]   If 7PM-7AM, please contact night-coverage [email or web address] Password [facility]  07/19/2018, 7:21 AM

## 2018-07-19 NOTE — ED Notes (Signed)
ED Provider at bedside. 

## 2018-07-19 NOTE — ED Notes (Signed)
Patient transported to CT by this RN 

## 2018-07-19 NOTE — Progress Notes (Signed)
Spoke with daughter, Wyona Almas, and gave updates. Daughter is asking to be called with all updates and questions as patient has memory impairment.

## 2018-07-19 NOTE — ED Notes (Signed)
ED TO INPATIENT HANDOFF REPORT  ED Nurse Name and Phone #: Celene Squibb RN  S Name/Age/Gender Anna Lyons 63 y.o. female Room/Bed: 036C/036C  Code Status   Code Status: Full Code  Home/SNF/Other Home Patient oriented to: self, place, time and situation Is this baseline? Yes   Triage Complete: Triage complete  Chief Complaint SOB  Triage Note Pt arrives via [facility] from home for c/o L sided chest pain that is reproducible with palpation as well as pain with inspiration that awoke her from sleep this am. pts initial O2 sat was 89%, improved to 100% on 6L. Denies fever, chills, cough, body aches, recent travel or any sick contacts. Pt a/ox4.  Pt received 324mg  asa pta.    Allergies Allergies  Allergen Reactions  . Codeine Itching    Level of Care/Admitting Diagnosis ED Disposition    ED Disposition Condition [facility]: [place] [[id number]]  Level of Care: Telemetry Cardiac [103]  I expect the patient will be discharged within 24 hours: No (not a candidate for 5C-Observation unit)  Covid Evaluation: Screening Protocol (No Symptoms)  Diagnosis: Chest pain [559741]  Admitting Physician: Norval Morton [[id number]]  Attending Physician: Norval Morton [[id number]]  PT Class (Do Not Modify): Observation [104]  PT Acc Code (Do Not Modify): Observation [10022]       B Medical/Surgery History Past Medical History:  Diagnosis Date  . Angina   . Breast cyst   . Cancer ([place])   . Depression   . Hypertension   . RLS (restless legs syndrome) 11/16/2011  . Seizures ([place]) 2016   last seizure="last year, 2016" per pt.  . Stroke ([place]) 04/20/2011   a. 04/20/11 Left subcortical infarct treated w/ TPA;  b. 04/2011 Echo: EF 55-60%;  c. 04/2011 Normal Carotid u/s  d. Residual "walk w/limp on right; unable to grasp w/right hand"  . TIA (transient ischemic attack) 08/2010  . Tobacco abuse    a. quit @ time of CVA 04/2011.   Past Surgical History:   Procedure Laterality Date  . BREAST RECONSTRUCTION     bilaterally  . CARPAL TUNNEL RELEASE Left 1990's  . CARPAL TUNNEL RELEASE Left 12/08/2015  . CESAREAN SECTION  1979  . KNEE ARTHROSCOPY Left 1990's    cartilage repair  . LEFT HEART CATHETERIZATION WITH CORONARY ANGIOGRAM N/A 05/20/2011   Procedure: LEFT HEART CATHETERIZATION WITH CORONARY ANGIOGRAM;  Surgeon: Josue Hector, MD;  Location: [facility] CATH LAB;  Service: Cardiovascular;  Laterality: N/A;  . MASTECTOMY Bilateral 1980's   bilateral with reconstruction     A IV Location/Drains/Wounds Patient Lines/Drains/Airways Status   Active Line/Drains/Airways    Name:   Placement date:   Placement time:   Site:   Days:   Peripheral IV 07/19/18 Right Antecubital   07/19/18    0500    Antecubital   less than 1   Peripheral IV 07/19/18 Left Antecubital   07/19/18    1000    Antecubital   less than 1          Intake/Output Last 24 hours  Intake/Output Summary (Last 24 hours) at 07/19/2018 1303 Last data filed at 07/19/2018 2248 Gross per 24 hour  Intake 100 ml  Output -  Net 100 ml    Labs/Imaging Results for orders placed or performed during the hospital encounter of 07/19/18 (from the past 48 hour(s))  CBC with Differential     Status: None   Collection Time:  07/19/18  5:09 AM  Result Value Ref Range   WBC 9.7 4.0 - 10.5 K/uL   RBC 3.99 3.87 - 5.11 MIL/uL   Hemoglobin 13.0 12.0 - 15.0 g/dL   HCT 38.4 36.0 - 46.0 %   MCV 96.2 80.0 - 100.0 fL   MCH 32.6 26.0 - 34.0 pg   MCHC 33.9 30.0 - 36.0 g/dL   RDW 12.7 11.5 - 15.5 %   Platelets 151 150 - 400 K/uL   nRBC 0.0 0.0 - 0.2 %   Neutrophils Relative % 76 %   Neutro Abs 7.4 1.7 - 7.7 K/uL   Lymphocytes Relative 15 %   Lymphs Abs 1.4 0.7 - 4.0 K/uL   Monocytes Relative 8 %   Monocytes Absolute 0.8 0.1 - 1.0 K/uL   Eosinophils Relative 1 %   Eosinophils Absolute 0.1 0.0 - 0.5 K/uL   Basophils Relative 0 %   Basophils Absolute 0.0 0.0 - 0.1 K/uL   Immature Granulocytes  0 %   Abs Immature Granulocytes 0.03 0.00 - 0.07 K/uL    Comment: Performed at [facility], [address], [place], [place] [address]  Comprehensive metabolic panel     Status: Abnormal   Collection Time: 07/19/18  5:09 AM  Result Value Ref Range   Sodium 138 135 - 145 mmol/L   Potassium 3.2 (L) 3.5 - 5.1 mmol/L   Chloride 100 98 - 111 mmol/L   CO2 28 22 - 32 mmol/L   Glucose, Bld 95 70 - 99 mg/dL   BUN 12 8 - 23 mg/dL   Creatinine, Ser 0.83 0.44 - 1.00 mg/dL   Calcium 8.9 8.9 - 10.3 mg/dL   Total Protein 6.8 6.5 - 8.1 g/dL   Albumin 3.6 3.5 - 5.0 g/dL   AST 24 15 - 41 U/L   ALT 17 0 - 44 U/L   Alkaline Phosphatase 61 38 - 126 U/L   Total Bilirubin 0.7 0.3 - 1.2 mg/dL   GFR calc non Af Amer >60 >60 mL/min   GFR calc Af Amer >60 >60 mL/min   Anion gap 10 5 - 15    Comment: Performed at [facility], [place] [address] [place], [place] [address]  Troponin I - ONCE - STAT     Status: None   Collection Time: 07/19/18  5:09 AM  Result Value Ref Range   Troponin I <0.03 <0.03 ng/mL    Comment: Performed at [facility] [address] [place],  [address]  I-Stat Creatinine, ED (not at [facility])     Status: None   Collection Time: 07/19/18  5:32 AM  Result Value Ref Range   Creatinine, Ser 0.80 0.44 - 1.00 mg/dL  SARS Coronavirus 2     Status: None   Collection Time: 07/19/18  5:33 AM  Result Value Ref Range   SARS Coronavirus 2 NOT DETECTED NOT DETECTED    Comment: (NOTE) SARS-CoV-2 target nucleic acids are NOT DETECTED. The SARS-CoV-2 RNA is generally detectable in upper and lower respiratory specimens during the acute phase of infection.  Negative  results do not preclude SARS-CoV-2 infection, do not rule out co-infections with other pathogens, and should not be used as the sole basis for treatment or other patient management decisions.  Negative results must be combined with clinical observations, patient history, and epidemiological information. The  expected result is Not Detected. Fact Sheet for Patients: http://www.biofiredefense.com/wp-content/uploads/2020/03/BIOFIRE-COVID -19-patients.pdf Fact Sheet for Healthcare Providers: http://www.biofiredefense.com/wp-content/uploads/2020/03/BIOFIRE-COVID -19-hcp.pdf This test  is not yet approved or cleared by the [place] and  has been authorized for detection and/or diagnosis of SARS-CoV-2 by FDA under an Emergency Use Authorization (EUA).  This EUA will remain in effec t (meaning this test can be used) for the duration of  the COVID-19 declaration under Section 564(b)(1) of the Act, 21 U.S.C. section 360bbb-3(b)(1), unless the authorization is terminated or revoked sooner. Performed at [facility], [place] [address], [place], [place] [address]   Lipid panel     Status: None   Collection Time: 07/19/18  8:29 AM  Result Value Ref Range   Cholesterol 99 0 - 200 mg/dL   Triglycerides 119 <150 mg/dL   HDL 52 >40 mg/dL   Total CHOL/HDL Ratio 1.9 RATIO   VLDL 24 0 - 40 mg/dL   LDL Cholesterol 23 0 - 99 mg/dL    Comment:        Total Cholesterol/HDL:CHD Risk Coronary Heart Disease Risk Table                     Men   Women  1/2 Average Risk   3.4   3.3  Average Risk       5.0   4.4  2 X Average Risk   9.6   7.1  3 X Average Risk  23.4   11.0        Use the calculated Patient Ratio above and the CHD Risk Table to determine the patient's CHD Risk.        ATP III CLASSIFICATION (LDL):  <100     mg/dL   Optimal  100-129  mg/dL   Near or Above                    Optimal  130-159  mg/dL   Borderline  160-189  mg/dL   High  >190     mg/dL   Very High Performed at [facility] [address] [place], [place] [address]   Troponin I - Now Then Q3H     Status: None   Collection Time: 07/19/18  8:29 AM  Result Value Ref Range   Troponin I <0.03 <0.03 ng/mL    Comment: Performed at [facility] [address], [place],  [address]  Troponin I - Now  Then Q3H     Status: None   Collection Time: 07/19/18  8:29 AM  Result Value Ref Range   Troponin I <0.03 <0.03 ng/mL    Comment: Performed at [facility] [address] [place], [place] [address]   Ct Angio Chest Pe W And/or Wo Contrast  Result Date: 07/19/2018 CLINICAL DATA:  Left-sided chest pain reproduced with inspiration. PE suspected. High pretest probability. EXAM: CT ANGIOGRAPHY CHEST WITH CONTRAST TECHNIQUE: Multidetector CT imaging of the chest was performed using the standard protocol during bolus administration of intravenous contrast. Multiplanar CT image reconstructions and MIPs were obtained to evaluate the vascular anatomy. CONTRAST:  8mL OMNIPAQUE IOHEXOL 350 MG/ML SOLN COMPARISON:  One-view chest x-ray 07/19/2018.  CTA chest 08/17/2013 FINDINGS: Cardiovascular: Heart is enlarged. Coronary artery calcifications are evident in the LAD. No significant pericardial effusion is present. Atherosclerotic changes are noted at the aorta. There is no aneurysm. No significant stenosis is present at the origins of the great vessels. Pulmonary arteries are enlarged. Right main pulmonary artery measures 2.6 cm. Main pulmonary trunk measures 3 point 5 cm. There are no focal  filling defects to suggest pulmonary embolus. Mediastinum/Nodes: No significant mediastinal, hilar, or axillary adenopathy is present. Thoracic inlet is normal. Esophagus is within normal limits. Lungs/Pleura: Mild dependent atelectasis is present. No focal nodule, mass, or airspace disease is present. There is no significant pleural effusion or pneumothorax. Upper Abdomen: Within normal limits. Musculoskeletal: Vertebral body heights and alignment are maintained. Mild rightward curvature is again noted. Ribs are unremarkable. Review of the MIP images confirms the above findings. IMPRESSION: 1. No pulmonary embolus. 2. Enlarged pulmonary arteries suggest pulmonary arterial hypertension. 3. Cardiomegaly without failure. 4.   Aortic Atherosclerosis (ICD10-I70.0). 5. Coronary artery disease with calcifications in the LAD. Electronically Signed   By: San Morelle M.D.   On: 07/19/2018 06:29   Dg Chest Portable 1 View  Result Date: 07/19/2018 CLINICAL DATA:  Left-sided chest pain. Pain woke her from sleep today. Shortness of breath. EXAM: PORTABLE CHEST 1 VIEW COMPARISON:  Two-view chest x-ray 02/14/2018 FINDINGS: Heart is mildly enlarged. Atherosclerotic changes are noted at the aortic arch. There is no edema or effusion. No focal airspace disease is present. Visualized soft tissues and bony thorax are unremarkable. IMPRESSION: 1. Borderline cardiomegaly without failure. 2. No acute cardiopulmonary disease. Electronically Signed   By: San Morelle M.D.   On: 07/19/2018 05:52    Pending Labs Unresulted Labs (From admission, onward)    Start     Ordered   07/19/18 0901  Urinalysis, Routine w reflex microscopic  Once,   STAT     07/19/18 0900   07/19/18 0820  HIV antibody (Routine Testing)  Once,   STAT     07/19/18 0820   07/19/18 0820  Troponin I - Now Then Q3H  Now then every 3 hours,   TIMED (with STAT occurrences)     07/19/18 0820          Vitals/Pain Today's Vitals   07/19/18 1045 07/19/18 1100 07/19/18 1113 07/19/18 1205  BP: 110/67 112/71    Pulse: 75 74  71  Resp: 11 14  11   Temp:      TempSrc:      SpO2: 92% 93%  93%  PainSc:   4      Isolation Precautions No active isolations  Medications Medications  fentaNYL (SUBLIMAZE) injection 50 mcg (50 mcg Intravenous Not Given 07/19/18 0921)  hydrochlorothiazide (MICROZIDE) capsule 12.5 mg (12.5 mg Oral Given 07/19/18 1115)  baclofen (LIORESAL) tablet 20 mg (has no administration in time range)  levETIRAcetam (KEPPRA) tablet 500 mg (500 mg Oral Given 07/19/18 1115)  pramipexole (MIRAPEX) tablet 0.5 mg (has no administration in time range)  aspirin EC tablet 325 mg (has no administration in time range)  acetaminophen (TYLENOL) tablet  650 mg (has no administration in time range)  ondansetron (ZOFRAN) injection 4 mg (has no administration in time range)  Vilazodone HCl (VIIBRYD) TABS 40 mg (has no administration in time range)  temazepam (RESTORIL) capsule 15-30 mg (has no administration in time range)  escitalopram (LEXAPRO) tablet 20 mg (20 mg Oral Given 07/19/18 1118)  enoxaparin (LOVENOX) injection 40 mg (40 mg Subcutaneous Given 07/19/18 1119)  alum & mag hydroxide-simeth (MAALOX/MYLANTA) 200-200-20 MG/5ML suspension 30 mL (has no administration in time range)  nitroGLYCERIN (NITROSTAT) SL tablet 0.4 mg (has no administration in time range)  fentaNYL (SUBLIMAZE) injection 25 mcg (25 mcg Intravenous Given 07/19/18 0921)  albuterol (PROVENTIL) (2.5 MG/3ML) 0.083% nebulizer solution 2.5 mg (2.5 mg Nebulization Given 07/19/18 1205)  guaiFENesin (MUCINEX) 12 hr tablet 600 mg (600 mg  Oral Given 07/19/18 1118)  nicotine (NICODERM CQ - dosed in mg/24 hours) patch 14 mg (14 mg Transdermal Patch Applied 07/19/18 1114)  albuterol (VENTOLIN HFA) 108 (90 Base) MCG/ACT inhaler 8 puff (8 puffs Inhalation Given 07/19/18 0521)  fentaNYL (SUBLIMAZE) injection 50 mcg (50 mcg Intravenous Given 07/19/18 0521)  ondansetron (ZOFRAN) injection 4 mg (4 mg Intravenous Given 07/19/18 0521)  methylPREDNISolone sodium succinate (SOLU-MEDROL) 125 mg/2 mL injection 125 mg (125 mg Intravenous Given 07/19/18 0521)  iohexol (OMNIPAQUE) 350 MG/ML injection 75 mL (75 mLs Intravenous Contrast Given 07/19/18 0552)  albuterol (VENTOLIN HFA) 108 (90 Base) MCG/ACT inhaler 8 puff (8 puffs Inhalation Given 07/19/18 0757)  albuterol (PROVENTIL) (2.5 MG/3ML) 0.083% nebulizer solution 5 mg (5 mg Nebulization Given 07/19/18 0733)  magnesium sulfate IVPB 2 g 50 mL (0 g Intravenous Stopped 07/19/18 0921)  potassium chloride SA (K-DUR) CR tablet 40 mEq (40 mEq Oral Given 07/19/18 0759)    Mobility walks Low fall risk   Focused Assessments Pulmonary Assessment Handoff  Lung  sounds: Bilateral Breath Sounds: Expiratory wheezes O2 Device: Nasal Cannula O2 Flow Rate (L/min): 2 L/min      R Recommendations: See Admitting Provider Note  Report given to:   Additional Notes:

## 2018-07-20 ENCOUNTER — Encounter (HOSPITAL_COMMUNITY): Payer: Self-pay | Admitting: General Practice

## 2018-07-20 ENCOUNTER — Other Ambulatory Visit: Payer: Self-pay

## 2018-07-20 DIAGNOSIS — R0789 Other chest pain: Secondary | ICD-10-CM | POA: Diagnosis not present

## 2018-07-20 DIAGNOSIS — R0603 Acute respiratory distress: Secondary | ICD-10-CM | POA: Diagnosis not present

## 2018-07-20 DIAGNOSIS — I10 Essential (primary) hypertension: Secondary | ICD-10-CM | POA: Diagnosis not present

## 2018-07-20 DIAGNOSIS — Z1159 Encounter for screening for other viral diseases: Secondary | ICD-10-CM | POA: Diagnosis not present

## 2018-07-20 MED ORDER — ATORVASTATIN CALCIUM 80 MG PO TABS: 80.0000 mg | ORAL_TABLET | Freq: Every day | ORAL | 0 refills | Status: DC

## 2018-07-20 MED ORDER — NICOTINE 14 MG/24HR TD PT24: 14.0000 mg | MEDICATED_PATCH | Freq: Every day | TRANSDERMAL | 0 refills | Status: DC

## 2018-07-20 MED ORDER — LEVALBUTEROL HCL 0.63 MG/3ML IN NEBU
0.6300 mg | INHALATION_SOLUTION | Freq: Two times a day (BID) | RESPIRATORY_TRACT | Status: DC
  Administered 2018-07-20: 10:00:00 0.63 mg via RESPIRATORY_TRACT
  Filled 2018-07-20: qty 3

## 2018-07-20 NOTE — Progress Notes (Addendum)
The patient has been seen in conjunction with Reino Bellis. All aspects of care have been considered and discussed. The patient has been personally interviewed, examined, and all clinical data has been reviewed.   : No further work-up at this time.  Please call if we may be of further assistance.   Progress Note  Patient Name: NYALA KIRCHNER Date of Encounter: 07/20/2018  Primary Cardiologist: No primary care provider on file.   Subjective   Significant improvement in chest pain overnight. Feeling well this morning.  Inpatient Medications    Scheduled Meds:  aspirin EC  325 mg Oral Daily   enoxaparin (LOVENOX) injection  40 mg Subcutaneous Q24H   escitalopram  20 mg Oral Daily   fentaNYL (SUBLIMAZE) injection  50 mcg Intravenous Once   guaiFENesin  600 mg Oral BID   hydrochlorothiazide  12.5 mg Oral Daily   levalbuterol  0.63 mg Nebulization BID   levETIRAcetam  500 mg Oral BID   nicotine  14 mg Transdermal Daily   Vilazodone HCl  40 mg Oral Daily   Continuous Infusions:  PRN Meds: acetaminophen, alum & mag hydroxide-simeth, baclofen, fentaNYL (SUBLIMAZE) injection, nitroGLYCERIN, ondansetron (ZOFRAN) IV, pramipexole, temazepam   Vital Signs    Vitals:   07/19/18 1958 07/19/18 2036 07/20/18 0034 07/20/18 0528  BP:  107/63 110/64 116/69  Pulse:  73 70 (!) 56  Resp:  17 13 15   Temp:  98.4 F (36.9 C) 98.1 F (36.7 C) 97.9 F (36.6 C)  TempSrc:  Oral Oral Oral  SpO2: 97% 93% 94% 100%    Intake/Output Summary (Last 24 hours) at 07/20/2018 0949 Last data filed at 07/20/2018 0900 Gross per 24 hour  Intake 780 ml  Output 950 ml  Net -170 ml   Last 3 Weights 04/07/2018 02/13/2018 10/17/2017  Weight (lbs) 137 lb 135 lb 137 lb  Weight (kg) 62.143 kg 61.236 kg 62.143 kg      Telemetry    SR - Personally Reviewed  ECG    SR with continued TWI anterior leads  - Personally Reviewed  Physical Exam   GEN: No acute distress.   Neck: No JVD Cardiac:  RRR, no murmurs, rubs, or gallops.  Respiratory: Clear to auscultation bilaterally. GI: Soft, nontender, non-distended  MS: No edema; No deformity. Neuro:  Nonfocal  Psych: Normal affect   Labs    Chemistry Recent Labs  Lab 07/19/18 0509 07/19/18 0532  NA 138  --   K 3.2*  --   CL 100  --   CO2 28  --   GLUCOSE 95  --   BUN 12  --   CREATININE 0.83 0.80  CALCIUM 8.9  --   PROT 6.8  --   ALBUMIN 3.6  --   AST 24  --   ALT 17  --   ALKPHOS 61  --   BILITOT 0.7  --   GFRNONAA >60  --   GFRAA >60  --   ANIONGAP 10  --      Hematology Recent Labs  Lab 07/19/18 0509  WBC 9.7  RBC 3.99  HGB 13.0  HCT 38.4  MCV 96.2  MCH 32.6  MCHC 33.9  RDW 12.7  PLT 151    Cardiac Enzymes Recent Labs  Lab 07/19/18 0509 07/19/18 0829 07/19/18 1441  TROPONINI <0.03 <0.03   <0.03 <0.03   No results for input(s): TROPIPOC in the last 168 hours.   BNPNo results for input(s): BNP, PROBNP in the  last 168 hours.   DDimer No results for input(s): DDIMER in the last 168 hours.   Radiology    Ct Angio Chest Pe W And/or Wo Contrast  Result Date: 07/19/2018 CLINICAL DATA:  Left-sided chest pain reproduced with inspiration. PE suspected. High pretest probability. EXAM: CT ANGIOGRAPHY CHEST WITH CONTRAST TECHNIQUE: Multidetector CT imaging of the chest was performed using the standard protocol during bolus administration of intravenous contrast. Multiplanar CT image reconstructions and MIPs were obtained to evaluate the vascular anatomy. CONTRAST:  77mL OMNIPAQUE IOHEXOL 350 MG/ML SOLN COMPARISON:  One-view chest x-ray 07/19/2018.  CTA chest 08/17/2013 FINDINGS: Cardiovascular: Heart is enlarged. Coronary artery calcifications are evident in the LAD. No significant pericardial effusion is present. Atherosclerotic changes are noted at the aorta. There is no aneurysm. No significant stenosis is present at the origins of the great vessels. Pulmonary arteries are enlarged. Right main pulmonary  artery measures 2.6 cm. Main pulmonary trunk measures 3 point 5 cm. There are no focal filling defects to suggest pulmonary embolus. Mediastinum/Nodes: No significant mediastinal, hilar, or axillary adenopathy is present. Thoracic inlet is normal. Esophagus is within normal limits. Lungs/Pleura: Mild dependent atelectasis is present. No focal nodule, mass, or airspace disease is present. There is no significant pleural effusion or pneumothorax. Upper Abdomen: Within normal limits. Musculoskeletal: Vertebral body heights and alignment are maintained. Mild rightward curvature is again noted. Ribs are unremarkable. Review of the MIP images confirms the above findings. IMPRESSION: 1. No pulmonary embolus. 2. Enlarged pulmonary arteries suggest pulmonary arterial hypertension. 3. Cardiomegaly without failure. 4.  Aortic Atherosclerosis (ICD10-I70.0). 5. Coronary artery disease with calcifications in the LAD. Electronically Signed   By: San Morelle M.D.   On: 07/19/2018 06:29   Dg Chest Portable 1 View  Result Date: 07/19/2018 CLINICAL DATA:  Left-sided chest pain. Pain woke her from sleep today. Shortness of breath. EXAM: PORTABLE CHEST 1 VIEW COMPARISON:  Two-view chest x-ray 02/14/2018 FINDINGS: Heart is mildly enlarged. Atherosclerotic changes are noted at the aortic arch. There is no edema or effusion. No focal airspace disease is present. Visualized soft tissues and bony thorax are unremarkable. IMPRESSION: 1. Borderline cardiomegaly without failure. 2. No acute cardiopulmonary disease. Electronically Signed   By: San Morelle M.D.   On: 07/19/2018 05:52    Cardiac Studies   TTE: 07/19/18  IMPRESSIONS    1. The left ventricle has normal systolic function with an ejection fraction of 60-65%. The cavity size was normal. Left ventricular diastolic Doppler parameters are consistent with impaired relaxation. Elevated left ventricular end-diastolic pressure  The E/e' is 47.  2. The right  ventricle has normal systolic function. The cavity was normal. There is no increase in right ventricular wall thickness.  Patient Profile     63 y.o. female with a history of normal coronaries on cardiac catheterization in 05/2011 and negative Myoview in 05/2012, stroke treated with TPA in 04/2011, breast cancer s/p bilateral mastectomy with recostruction in the 1980's, seizures, hypertension, cancer, and tobacco abuse, who was seen for chest pain at the request of Dr. Tamala Julian.  Assessment & Plan    1. Atypical Chest Pain: chest pain with significant improvement overnight. EKG without acute change this morning. Echo with normal EF and no rWMA. Troponin negative x3. Do not anticipate further work up at this time.   2. Questionable Syncope: Telemetry shows sinus rhythm with rates in the 60's to 80's. No arrhythmias noted. Echo as above.  3. Hypertension - BP currently well controlled.  - Continue  home HCTZ 12.5mg  daily.  4. Hypokalemia - Potassium 3.2 on admission. Received one dose of K-Dur 40mEq. - Recheck BMET today  5. Tobacco Abuse - Patient has 40+ year smoking history. She states she has been cutting back a lot lately and currently smokes 1 pack every 3 days. - Complete cessation advised.    For questions or updates, please contact [place] Please consult [email or web address] for contact info under    Signed, Reino Bellis, NP  07/20/2018, 9:49 AM

## 2018-07-20 NOTE — Plan of Care (Signed)
Pt is progressing toward desired goal for this admissison

## 2018-07-20 NOTE — Progress Notes (Signed)
Pt d/c home to self care, no new concerns or complains. Pt is stable. D/C instruction done with teach back pt verbalize understanding. Pt will be transported out of the hospital by family.

## 2018-07-20 NOTE — Discharge Summary (Signed)
Discharge Summary  [name]:[id number] DOB: Aug 09, 1955  PCP: Hoyt Koch, MD  Admit date: 07/19/2018 Discharge date: 07/20/2018  Time spent: 35 minutes  Recommendations for Outpatient Follow-up:  1. Follow-up with your PCP 2. Follow-up with cardiology 3. Take medications as prescribed 4. Completely abstain from tobacco use  Discharge Diagnoses:  Active Hospital Problems   Diagnosis Date Noted   Atypical chest pain 07/19/2018   Acute respiratory distress 07/19/2018   History of stroke 02/17/2017   Seizure disorder ([facility]) 05/19/2012   Hypokalemia 11/16/2011   Tobacco abuse 05/19/2011   Essential hypertension, benign 05/19/2011    Resolved Hospital Problems  No resolved problems to display.    Discharge Condition: Stable  Diet recommendation: Resume previous diet  Vitals:   07/20/18 0528 07/20/18 0949  BP: 116/69   Pulse: (!) 56   Resp: 15   Temp: 97.9 F (36.6 C)   SpO2: 100% 98%    History of present illness:   HPI: Anna Lyons is a 63 y.o. female with medical history significant of HTN, CVA/TIA, seizures, breast cancer s/p b/l mastectomy, depression, and depression; who presents with complaints of severe left-sided chest pain woke her up out of her sleep.  Notes that pain radiated into her back and was worse with deep inspiration and palpation.  Denies any diaphoresis, nausea, vomiting, abdominal pain, recent fall/trauma, heavy lifting, strenuous activityseizure activity, or incontinence of bowel/urine.  She states that she is never had chest pain like this before in the past and question if she was having a heart attack.  She felt like she was out of it during the time and called her daughter.  Patient also notes that it is been hot where she works and she has had some shortness of breath.  Also, complains of her urine being "hot" along with the around her grandson who recently tested positive for strep throat.  Denies any leg swelling, sore  throat, cough, leg swelling or calf pain.  She admits to smoking a little bit less than half a pack cigarettes per day on average and has done so for quite some time.  Does not note a history of asthma or COPD, and has no access to any inhalers at home..   En route with EMS patient initial O2 saturations were noted to be 89% and improved to 100% on 6 L of nasal cannula oxygen.  She also the 324 mg of aspirin prior to arrival.  ED Course: Upon admission into the emergency department patient was noted to be afebrile (99.4 F), O2 saturations 91 to 96% on 2 L nasal cannula oxygen.  Lab work revealed potassium 3.2.  All other labs including CBC, CMP, and initial troponin are unremarkable.  Chest x-ray showed borderline cardiomegaly without signs of failure.  Due to patient's symptoms CT angiogram of the chest was ordered.  Study showed no signs of PE, large pulmonary arteries to suggest pulmonary artery hypertension, cardiomegaly without failure, aortic atherosclerosis, and coronary artery calcifications in the LAD.  EKG with T wave inversions in the anterior leads.  Patient was given multiple doses of albuterol, fentanyl, 125 mg of Solu-Medrol, and 2 g of magnesium sulfate.  TRH called to admit for suspect COPD exacerbation with A. fib chest pain.  07/20/18: Patient seen and examined at bedside.  No acute events overnight.  At the time of this visit the patient had no chest pain and no dyspnea or palpitations.  Seen by cardiology with no plan for  further work-up in the hospital.  Will follow-up with cardiology outpatient.  Troponin negative x4.  Twelve-lead EKG unremarkable for any specific ST-T changes.  On the day of discharge, the patient was hemodynamically stable.  She will need to follow-up with her primary care provider, cardiology posthospitalization.  She will also need to completely abstain from tobacco use.   Hospital Course:  Principal Problem:   Atypical chest pain Active Problems:    Essential hypertension, benign   Tobacco abuse   Hypokalemia   Seizure disorder (HCC)   History of stroke   Acute respiratory distress   Atypical chest pain Rule out ACS: Likely caused to costochondritis, treat with NSAIDS prn. Patient presents with complaints of left-sided chest pain.  On physical exam on admission chest pain noted to be reproducible.  Denies chest pain on day of discharge Troponin negative x4 Twelve-lead EKG unremarkable for any specific ST-T changes. CT angiogram of the chest does note calcifications of the LAD and aorta with cardiomegaly. 2D echo done on 07/18/2018 shows normal LVEF.  Cardiology was consulted.  Acute respiratory distress secondary to acute bronchitis:  Patient noted to have O2 saturations as low as 89% on room air.  Chest x-ray shows no clear signs of infiltrate.  Initially noted to be wheezing, but currently physical exam only shows mildly prolonged expiration.  No previous PFTs available. Continue home inhalers Follow-up with your primary care provider  Hypokalemia,repleted: Acute.  Initial potassium noted to be 3.2 on admission.   -Give 40 mEq of potassium chloride x1 dose now Follow-up with your PCP  Essential hypertension:  Blood pressure is normotensive -Continue hydrochlorothiazide   Depression -Continue Lexapro and vilazodone  History of seizure disorder: Patient denies any recent seizure activity. -Continue Keppra  History of CVA/TIA: Patient with previous history of TIAs and remote left basal ganglion lacunar infarct.  Denies any residual deficits -Continue aspirin  History of breast cancer: Patient status post bilateral mastectomy.  Hyperlipidemia: Patient previously had been on Lipitor 80 mg daily. -Follow-up lipid panel  RLS: -Continue home regimen  Tobacco abuse: Admits to smoking half a pack cigarettes per day on average. -Continue counseling on need of cessation of tobacco use -Nicotine patch  -Tobacco  cessation counseling at baseline   Code Status: Full  Consults called: Cardiology     Discharge Exam: BP 116/69 (BP Location: Left Arm)    Pulse (!) 56    Temp 97.9 F (36.6 C) (Oral)    Resp 15    SpO2 98%   General: 63 y.o. year-old female well developed well nourished in no acute distress.  Alert and oriented x3.  Cardiovascular: Regular rate and rhythm with no rubs or gallops.  No thyromegaly or JVD noted.    Respiratory: Clear to auscultation with no wheezes or rales. Good inspiratory effort.  Abdomen: Soft nontender nondistended with normal bowel sounds x4 quadrants.  Musculoskeletal: No lower extremity edema. 2/4 pulses in all 4 extremities.  Skin: No ulcerative lesions noted or rashes,  Psychiatry: Mood is appropriate for condition and setting  Discharge Instructions You were cared for by a hospitalist during your hospital stay. If you have any questions about your discharge medications or the care you received while you were in the hospital after you are discharged, you can call the unit and asked to speak with the hospitalist on call if the hospitalist that took care of you is not available. Once you are discharged, your primary care physician will handle any further medical issues.  Please note that NO REFILLS for any discharge medications will be authorized once you are discharged, as it is imperative that you return to your primary care physician (or establish a relationship with a primary care physician if you do not have one) for your aftercare needs so that they can reassess your need for medications and monitor your lab values.   Allergies as of 07/20/2018      Reactions   Codeine Itching      Medication List    STOP taking these medications   alosetron 0.5 MG tablet Commonly known as: LOTRONEX   diclofenac sodium 1 % Gel Commonly known as: Voltaren   levocetirizine 5 MG tablet Commonly known as: XYZAL   ranitidine 300 MG capsule Commonly known as:  ZANTAC     TAKE these medications   albuterol 108 (90 Base) MCG/ACT inhaler Commonly known as: VENTOLIN HFA Inhale 2 puffs into the lungs every 6 (six) hours as needed for wheezing or shortness of breath.   aspirin EC 325 MG tablet Take 325 mg by mouth daily.   atorvastatin 80 MG tablet Commonly known as: LIPITOR Take 1 tablet (80 mg total) by mouth daily. Need visit before next refill   baclofen 20 MG tablet Commonly known as: LIORESAL TAKE 1/2 (ONE-HALF) TABLET BY MOUTH THREE TIMES DAILY FOR 7 DAYS AND THEN  1 TABLET THREE TIMES DAILY What changed:   how much to take  how to take this  when to take this  additional instructions   escitalopram 20 MG tablet Commonly known as: LEXAPRO Take 1 tablet by mouth once daily   hydrochlorothiazide 12.5 MG capsule Commonly known as: Microzide Take 1 capsule (12.5 mg total) by mouth daily.   ibuprofen 800 MG tablet Commonly known as: ADVIL Take 1 tablet by mouth every 8 (eight) hours as needed for moderate pain.   levETIRAcetam 500 MG tablet Commonly known as: KEPPRA Take 1 tablet (500 mg total) by mouth 2 (two) times daily.   multivitamin with minerals Tabs tablet Take 1 tablet by mouth daily.   nicotine 14 mg/24hr patch Commonly known as: NICODERM CQ - dosed in mg/24 hours Place 1 patch (14 mg total) onto the skin daily. Start taking on: July 21, 2018   ondansetron 4 MG tablet Commonly known as: Zofran Take 1 tablet (4 mg total) by mouth every 8 (eight) hours as needed for nausea or vomiting.   ondansetron 8 MG disintegrating tablet Commonly known as: ZOFRAN-ODT Take 1 tablet (8 mg total) by mouth every 8 (eight) hours as needed for nausea or vomiting.   pramipexole 0.5 MG tablet Commonly known as: MIRAPEX Take 1 tablet (0.5 mg total) by mouth at bedtime as needed. What changed: reasons to take this   temazepam 15 MG capsule Commonly known as: RESTORIL Take 1-2 capsules (15-30 mg total) by mouth at bedtime as  needed for sleep.   Viibryd 40 MG Tabs Generic drug: Vilazodone HCl Take 1 tablet by mouth once daily What changed: how much to take      Allergies  Allergen Reactions   Codeine Itching   Follow-up Information    Hoyt Koch, MD. Call in 1 day(s).   Specialty: Internal Medicine Why: Please call for a post hospital follow-up appointment. Contact information: [address] [place] [address] [phone]        Belva Crome, MD. Call in 1 day(s).   Specialty: Cardiology Why: Please call for a post hospital appointment. Contact information:  [place] [place] [address] [phone]            The results of significant diagnostics from this hospitalization (including imaging, microbiology, ancillary and laboratory) are listed below for reference.    Significant Diagnostic Studies: Ct Angio Chest Pe W And/or Wo Contrast  Result Date: 07/19/2018 CLINICAL DATA:  Left-sided chest pain reproduced with inspiration. PE suspected. High pretest probability. EXAM: CT ANGIOGRAPHY CHEST WITH CONTRAST TECHNIQUE: Multidetector CT imaging of the chest was performed using the standard protocol during bolus administration of intravenous contrast. Multiplanar CT image reconstructions and MIPs were obtained to evaluate the vascular anatomy. CONTRAST:  28mL OMNIPAQUE IOHEXOL 350 MG/ML SOLN COMPARISON:  One-view chest x-ray 07/19/2018.  CTA chest 08/17/2013 FINDINGS: Cardiovascular: Heart is enlarged. Coronary artery calcifications are evident in the LAD. No significant pericardial effusion is present. Atherosclerotic changes are noted at the aorta. There is no aneurysm. No significant stenosis is present at the origins of the great vessels. Pulmonary arteries are enlarged. Right main pulmonary artery measures 2.6 cm. Main pulmonary trunk measures 3 point 5 cm. There are no focal filling defects to suggest pulmonary embolus. Mediastinum/Nodes: No  significant mediastinal, hilar, or axillary adenopathy is present. Thoracic inlet is normal. Esophagus is within normal limits. Lungs/Pleura: Mild dependent atelectasis is present. No focal nodule, mass, or airspace disease is present. There is no significant pleural effusion or pneumothorax. Upper Abdomen: Within normal limits. Musculoskeletal: Vertebral body heights and alignment are maintained. Mild rightward curvature is again noted. Ribs are unremarkable. Review of the MIP images confirms the above findings. IMPRESSION: 1. No pulmonary embolus. 2. Enlarged pulmonary arteries suggest pulmonary arterial hypertension. 3. Cardiomegaly without failure. 4.  Aortic Atherosclerosis (ICD10-I70.0). 5. Coronary artery disease with calcifications in the LAD. Electronically Signed   By: San Morelle M.D.   On: 07/19/2018 06:29   Dg Chest Portable 1 View  Result Date: 07/19/2018 CLINICAL DATA:  Left-sided chest pain. Pain woke her from sleep today. Shortness of breath. EXAM: PORTABLE CHEST 1 VIEW COMPARISON:  Two-view chest x-ray 02/14/2018 FINDINGS: Heart is mildly enlarged. Atherosclerotic changes are noted at the aortic arch. There is no edema or effusion. No focal airspace disease is present. Visualized soft tissues and bony thorax are unremarkable. IMPRESSION: 1. Borderline cardiomegaly without failure. 2. No acute cardiopulmonary disease. Electronically Signed   By: San Morelle M.D.   On: 07/19/2018 05:52    Microbiology: Recent Results (from the past 240 hour(s))  SARS Coronavirus 2     Status: None   Collection Time: 07/19/18  5:33 AM  Result Value Ref Range Status   SARS Coronavirus 2 NOT DETECTED NOT DETECTED Final    Comment: (NOTE) SARS-CoV-2 target nucleic acids are NOT DETECTED. The SARS-CoV-2 RNA is generally detectable in upper and lower respiratory specimens during the acute phase of infection.  Negative  results do not preclude SARS-CoV-2 infection, do not rule  out co-infections with other pathogens, and should not be used as the sole basis for treatment or other patient management decisions.  Negative results must be combined with clinical observations, patient history, and epidemiological information. The expected result is Not Detected. Fact Sheet for Patients: http://www.biofiredefense.com/wp-content/uploads/2020/03/BIOFIRE-COVID -19-patients.pdf Fact Sheet for Healthcare Providers: http://www.biofiredefense.com/wp-content/uploads/2020/03/BIOFIRE-COVID -19-hcp.pdf This test is not yet approved or cleared by the [place] and  has been authorized for detection and/or diagnosis of SARS-CoV-2 by FDA under an Emergency Use Authorization (EUA).  This EUA will remain in effec t (meaning this test can be  used) for the duration of  the COVID-19 declaration under Section 564(b)(1) of the Act, 21 U.S.C. section 360bbb-3(b)(1), unless the authorization is terminated or revoked sooner. Performed at [facility], [place] [address] [place], [place] [address]      Labs: Basic Metabolic Panel: Recent Labs  Lab 07/19/18 0509 07/19/18 0532  NA 138  --   K 3.2*  --   CL 100  --   CO2 28  --   GLUCOSE 95  --   BUN 12  --   CREATININE 0.83 0.80  CALCIUM 8.9  --    Liver Function Tests: Recent Labs  Lab 07/19/18 0509  AST 24  ALT 17  ALKPHOS 61  BILITOT 0.7  PROT 6.8  ALBUMIN 3.6   No results for input(s): LIPASE, AMYLASE in the last 168 hours. No results for input(s): AMMONIA in the last 168 hours. CBC: Recent Labs  Lab 07/19/18 0509  WBC 9.7  NEUTROABS 7.4  HGB 13.0  HCT 38.4  MCV 96.2  PLT 151   Cardiac Enzymes: Recent Labs  Lab 07/19/18 0509 07/19/18 0829 07/19/18 1441  TROPONINI <0.03 <0.03   <0.03 <0.03   BNP: BNP (last 3 results) Recent Labs    02/14/18 1511  BNP 34.7    ProBNP (last 3 results) No results for input(s): PROBNP in the last 8760 hours.  CBG: No results for input(s): GLUCAP in  the last 168 hours.     Signed:  Kayleen Memos, MD [facility] 07/20/2018, 12:13 PM

## 2018-07-20 NOTE — Discharge Instructions (Signed)
Chest Wall Pain Chest wall pain is pain in or around the bones and muscles of your chest. Chest wall pain may be caused by:  An injury.  Coughing a lot.  Using your chest and arm muscles too much. Sometimes, the cause may not be known. This pain may take a few weeks or longer to get better. Follow these instructions at home: Managing pain, stiffness, and swelling If told, put ice on the painful area:  Put ice in a plastic bag.  Place a towel between your skin and the bag.  Leave the ice on for 20 minutes, 2-3 times a day.  Activity  Rest as told by your doctor.  Avoid doing things that cause pain. This includes lifting heavy items.  Ask your doctor what activities are safe for you. General instructions   Take over-the-counter and prescription medicines only as told by your doctor.  Do not use any products that contain nicotine or tobacco, such as cigarettes, e-cigarettes, and chewing tobacco. If you need help quitting, ask your doctor.  Keep all follow-up visits as told by your doctor. This is important. Contact a doctor if:  You have a fever.  Your chest pain gets worse.  You have new symptoms. Get help right away if:  You feel sick to your stomach (nauseous) or you throw up (vomit).  You feel sweaty or light-headed.  You have a cough with mucus from your lungs (sputum) or you cough up blood.  You are short of breath. These symptoms may be an emergency. Do not wait to see if the symptoms will go away. Get medical help right away. Call your local emergency services (911 in the [place] Do not drive yourself to the hospital. Summary  Chest wall pain is pain in or around the bones and muscles of your chest.  It may be treated with ice, rest, and medicines. Your condition may also get better if you avoid doing things that cause pain.  Contact a doctor if you have a fever, chest pain that gets worse, or new symptoms.  Get help right away if you feel light-headed  or you get short of breath. These symptoms may be an emergency. This information is not intended to replace advice given to you by your health care provider. Make sure you discuss any questions you have with your health care provider. Document Released: 07/07/2007 Document Revised: 07/21/2017 Document Reviewed: 07/21/2017 Elsevier Interactive Patient Education  2019 Else[employer] Inc.   Costochondritis Costochondritis is swelling and irritation (inflammation) of the tissue (cartilage) that connects your ribs to your breastbone (sternum). This causes pain in the front of your chest. Usually, the pain:  Starts gradually.  Is in more than one rib. This condition usually goes away on its own over time. Follow these instructions at home:  Do not do anything that makes your pain worse.  If directed, put ice on the painful area: ? Put ice in a plastic bag. ? Place a towel between your skin and the bag. ? Leave the ice on for 20 minutes, 2-3 times a day.  If directed, put heat on the affected area as often as told by your doctor. Use the heat source that your doctor tells you to use, such as a moist heat pack or a heating pad. ? Place a towel between your skin and the heat source. ? Leave the heat on for 20-30 minutes. ? Take off the heat if your skin turns bright red. This is very important if  you cannot feel pain, heat, or cold. You may have a greater risk of getting burned.  Take over-the-counter and prescription medicines only as told by your doctor.  Return to your normal activities as told by your doctor. Ask your doctor what activities are safe for you.  Keep all follow-up visits as told by your doctor. This is important. Contact a doctor if:  You have chills or a fever.  Your pain does not go away or it gets worse.  You have a cough that does not go away. Get help right away if:  You are short of breath. This information is not intended to replace advice given to you by your  health care provider. Make sure you discuss any questions you have with your health care provider. Document Released: 07/07/2007 Document Revised: 08/08/2015 Document Reviewed: 05/14/2015 Elsevier Interactive Patient Education  2019 [employer].

## 2018-07-21 ENCOUNTER — Ambulatory Visit (INDEPENDENT_AMBULATORY_CARE_PROVIDER_SITE_OTHER): Payer: Medicare Other | Admitting: Internal Medicine

## 2018-07-21 ENCOUNTER — Other Ambulatory Visit: Payer: Self-pay

## 2018-07-21 ENCOUNTER — Encounter: Payer: Self-pay | Admitting: Internal Medicine

## 2018-07-21 VITALS — BP 110/82 | HR 70 | Temp 97.8°F | Ht 62.0 in | Wt 135.0 lb

## 2018-07-21 DIAGNOSIS — G40909 Epilepsy, unspecified, not intractable, without status epilepticus: Secondary | ICD-10-CM

## 2018-07-21 DIAGNOSIS — R0789 Other chest pain: Secondary | ICD-10-CM

## 2018-07-21 DIAGNOSIS — Z720 Tobacco use: Secondary | ICD-10-CM

## 2018-07-21 DIAGNOSIS — F331 Major depressive disorder, recurrent, moderate: Secondary | ICD-10-CM | POA: Diagnosis not present

## 2018-07-21 MED ORDER — PRAMIPEXOLE DIHYDROCHLORIDE 0.5 MG PO TABS: 0.5000 mg | ORAL_TABLET | Freq: Every evening | ORAL | 1 refills | Status: DC | PRN

## 2018-07-21 NOTE — Assessment & Plan Note (Signed)
Overall stable and no recent changes in medications.

## 2018-07-21 NOTE — Assessment & Plan Note (Signed)
Given the significant calcifications on imaging and prior strokes she likely needs stress testing. She will follow up with cardiology. She does take aspirin 325 mg daily (for prior stroke).

## 2018-07-21 NOTE — Assessment & Plan Note (Signed)
She is willing to make an attempt to quit and she is advised to fill the nicotine patches. Counseled about success rates and way to use the patches. She and her daughter understand.

## 2018-07-21 NOTE — Patient Instructions (Signed)
We will have you take 1 pill keppra in the morning. Take 2 pills keppra at night time.

## 2018-07-21 NOTE — Progress Notes (Signed)
   Subjective:   Patient ID: Anna Lyons, female    DOB: Dec 30, 1955, 63 y.o.   MRN: [id number]  HPI The patient is a 63 YO female coming in for hospital follow up (in for chest pain and hypoxia, O2 levels stabilized with short term oxygen, troponin negative times 3, CT angio chest with extensive calcifications in vessels without PE or pneumonia, ECHO with normal EF). She has had resolution of the chest pain although still somewhat sore to touch. Her daughter is present for the visit and has some additional concerns which were not addressed in the hospital due to visitor policies she was not able to be there to advocate for her mom. Her mom has had several episodes which sound to be seizures. She had 1 the day of admission. Daughter talking to mom on phone and suddenly she was unresponsive. She called 911 and went over. When she arrived her mom was rigid and not responding at all. She was not asleep however. Some shallow breathing. Gradually this resolved. This is the 3rd episode in the last several weeks of this. Her keppra has been lowered in the past. Several past stroke and has had seizure in the past.   PMH, [facility], social history reviewed and updated.   Review of Systems  Constitutional: Positive for appetite change and fatigue. Negative for activity change, fever and unexpected weight change.  HENT: Negative.   Eyes: Negative.   Respiratory: Negative for cough, chest tightness and shortness of breath.   Cardiovascular: Positive for chest pain. Negative for palpitations and leg swelling.  Gastrointestinal: Negative for abdominal distention, abdominal pain, constipation, diarrhea, nausea and vomiting.  Musculoskeletal: Negative.   Skin: Negative.   Neurological: Positive for seizures.       Potential seizures  Psychiatric/Behavioral: Negative.     Objective:  Physical Exam Constitutional:      Appearance: She is well-developed.  HENT:     Head: Normocephalic and atraumatic.  Neck:   Musculoskeletal: Normal range of motion.  Cardiovascular:     Rate and Rhythm: Normal rate and regular rhythm.  Pulmonary:     Effort: Pulmonary effort is normal. No respiratory distress.     Breath sounds: Normal breath sounds. No wheezing or rales.  Abdominal:     General: Bowel sounds are normal. There is no distension.     Palpations: Abdomen is soft.     Tenderness: There is no abdominal tenderness. There is no rebound.  Skin:    General: Skin is warm and dry.  Neurological:     Mental Status: She is alert and oriented to person, place, and time.     Coordination: Coordination normal.  Psychiatric:     Comments: Some slow cognition which is stable from prior    Vitals:   07/21/18 1036  BP: 110/82  Pulse: 70  Temp: 97.8 F (36.6 C)  TempSrc: Oral  SpO2: 95%  Weight: 135 lb (61.2 kg)  Height: 5\' 2"  (1.575 m)    Assessment & Plan:

## 2018-07-21 NOTE — Assessment & Plan Note (Signed)
Will ask her to increase dosing of keppra to 500 mg in the morning and 1000 mg in the evening. She is asked to call her neurologist and see if they think she needs EEG monitoring. There was no head imaging while in the hospital as this concern was not brought up.

## 2018-08-07 ENCOUNTER — Telehealth: Payer: Self-pay | Admitting: Internal Medicine

## 2018-08-07 NOTE — Telephone Encounter (Signed)
Pt states she has tried to nicoderm 1 & 2 and they are making her sick. Pt wants to know if there is a prescription she can get get to help her quit smoking.  [place], [place]. [phone] (Phone) [phone] (Fax

## 2018-08-08 NOTE — Telephone Encounter (Signed)
With her current medications I would be hesitant to prescribe any pills to help per stop smoking. She can try nicorette gum to see if this can help. She would only use these when having cravings to smoker rather than using all the time.

## 2018-08-08 NOTE — Telephone Encounter (Signed)
Patient informed of MD response and stated understanding  

## 2018-08-08 NOTE — Telephone Encounter (Signed)
I'm not sure what is the side effect from the nicoderm. Sick is not very descriptive.

## 2018-08-08 NOTE — Telephone Encounter (Signed)
States that they make her very nauseous with no appetite and will cause her to have very bad dreams. States that she was wearing them at night and they caused the bad dreams read she could switch to wearing them during the day and caused her to be nauseous.

## 2018-09-25 ENCOUNTER — Ambulatory Visit: Payer: Self-pay | Admitting: *Deleted

## 2018-09-25 NOTE — Telephone Encounter (Signed)
Pt called with complaints of right foot numbness x 1 month; she says that when she is sitting in hard chair or toilet her foot gets numb, and she almost falls; she says that her numbness over the weekend, and her toes feel frozen; she also complains of right leg pain; recommendations made per nurse triage protocol; she verbalized understanding; the pt sees Dr Sharlet Salina, [place]; pt transferred to [place] for scheduling.    Reason for Disposition . [1] Numbness or tingling in one or both feet AND [2] is a chronic symptom (recurrent or ongoing AND present > 4 weeks)  Answer Assessment - Initial Assessment Questions 1. SYMPTOM: "What is the main symptom you are concerned about?" (e.g., weakness, numbness)     Numbness right foot; worsening since 09/23/2018 2. ONSET: "When did this start?" (minutes, hours, days; while sleeping)    1 month ago 3. LAST NORMAL: "When was the last time you were normal (no symptoms)?"   1 month ago 4. PATTERN "Does this come and go, or has it been constant since it started?"  "Is it present now?"     no 5. CARDIAC SYMPTOMS: "Have you had any of the following symptoms: chest pain, difficulty breathing, palpitations?"     no 6. NEUROLOGIC SYMPTOMS: "Have you had any of the following symptoms: headache, dizziness, vision loss, double vision, changes in speech, unsteady on your feet?"    Can not feel foot; not sleeping well due to right leg since 09/23/2018  7. OTHER SYMPTOMS: "Do you have any other symptoms?"   no 8. PREGNANCY: "Is there any chance you are pregnant?" "When was your last menstrual period?"   no  Protocols used: NEUROLOGIC DEFICIT-A-AH

## 2018-09-25 NOTE — Telephone Encounter (Signed)
Appointment has been made for 8/26.

## 2018-09-25 NOTE — Telephone Encounter (Signed)
Noted thanks °

## 2018-09-27 ENCOUNTER — Encounter: Payer: Self-pay | Admitting: Internal Medicine

## 2018-09-27 ENCOUNTER — Other Ambulatory Visit: Payer: Self-pay

## 2018-09-27 ENCOUNTER — Other Ambulatory Visit (INDEPENDENT_AMBULATORY_CARE_PROVIDER_SITE_OTHER): Payer: Medicare Other

## 2018-09-27 ENCOUNTER — Ambulatory Visit (INDEPENDENT_AMBULATORY_CARE_PROVIDER_SITE_OTHER): Payer: Medicare Other | Admitting: Internal Medicine

## 2018-09-27 VITALS — BP 118/64 | HR 55 | Temp 97.7°F | Ht 62.0 in | Wt 135.0 lb

## 2018-09-27 DIAGNOSIS — Z23 Encounter for immunization: Secondary | ICD-10-CM | POA: Diagnosis not present

## 2018-09-27 DIAGNOSIS — G40909 Epilepsy, unspecified, not intractable, without status epilepticus: Secondary | ICD-10-CM | POA: Diagnosis not present

## 2018-09-27 DIAGNOSIS — R200 Anesthesia of skin: Secondary | ICD-10-CM

## 2018-09-27 DIAGNOSIS — R29818 Other symptoms and signs involving the nervous system: Secondary | ICD-10-CM

## 2018-09-27 LAB — VITAMIN B12: Vitamin B-12: 944 pg/mL — ABNORMAL HIGH (ref 211–911)

## 2018-09-27 LAB — LIPID PANEL
Cholesterol: 114 mg/dL (ref 0–200)
HDL: 59 mg/dL (ref >39.00)
LDL Cholesterol: 37 mg/dL (ref 0–99)
NonHDL: 54.96
Total CHOL/HDL Ratio: 2
Triglycerides: 91 mg/dL (ref 0.0–149.0)
VLDL: 18.2 mg/dL (ref 0.0–40.0)

## 2018-09-27 LAB — COMPREHENSIVE METABOLIC PANEL
ALT: 15 U/L (ref 0–35)
AST: 22 U/L (ref 0–37)
Albumin: 4.3 g/dL (ref 3.5–5.2)
Alkaline Phosphatase: 68 U/L (ref 39–117)
BUN: 12 mg/dL (ref 6–23)
CO2: 30 mEq/L (ref 19–32)
Calcium: 9.8 mg/dL (ref 8.4–10.5)
Chloride: 98 mEq/L (ref 96–112)
Creatinine, Ser: 0.9 mg/dL (ref 0.40–1.20)
GFR: 62.91 mL/min (ref >60.00)
Glucose, Bld: 112 mg/dL — ABNORMAL HIGH (ref 70–99)
Potassium: 3.7 mEq/L (ref 3.5–5.1)
Sodium: 137 mEq/L (ref 135–145)
Total Bilirubin: 0.6 mg/dL (ref 0.2–1.2)
Total Protein: 7.3 g/dL (ref 6.0–8.3)

## 2018-09-27 LAB — CBC
HCT: 38.5 % (ref 36.0–46.0)
Hemoglobin: 13 g/dL (ref 12.0–15.0)
MCHC: 33.9 g/dL (ref 30.0–36.0)
MCV: 97.3 fl (ref 78.0–100.0)
Platelets: 210 10*3/uL (ref 150.0–400.0)
RBC: 3.96 Mil/uL (ref 3.87–5.11)
RDW: 12.7 % (ref 11.5–15.5)
WBC: 5.1 10*3/uL (ref 4.0–10.5)

## 2018-09-27 LAB — TSH: TSH: 1.9 u[IU]/mL (ref 0.35–4.50)

## 2018-09-27 LAB — VITAMIN D 25 HYDROXY (VIT D DEFICIENCY, FRACTURES): VITD: 52.48 ng/mL (ref 30.00–100.00)

## 2018-09-27 LAB — T4, FREE: Free T4: 0.88 ng/dL (ref 0.60–1.60)

## 2018-09-27 LAB — HEMOGLOBIN A1C: Hgb A1c MFr Bld: 5.6 % (ref 4.6–6.5)

## 2018-09-27 MED ORDER — GABAPENTIN 100 MG PO CAPS: 100.0000 mg | ORAL_CAPSULE | Freq: Three times a day (TID) | ORAL | 3 refills | Status: DC

## 2018-09-27 NOTE — Patient Instructions (Signed)
We will get an MRI of the brain and do blood work today.   We have sent in gabapentin to take for the pain in the feet.

## 2018-09-27 NOTE — Progress Notes (Signed)
   Subjective:   Patient ID: Anna Lyons, female    DOB: 1955/04/15, 63 y.o.   MRN: [id number]  HPI The patient is a 63 YO female with history of stroke coming in for worsening right foot numbness and pain for the last month or two. It is worsening in the last week or so. Initially was just toes numb and now progressing up the foot. She is having sharp pains in the foot and toes which is unbearable and this is new/worsening in the last 1-2 weeks. Past stroke and denies missing medications recently. Denies headaches. Denies numbness elsewhere or weakness. Denies speech or vision changes. Has been falling in the bathroom a lot due to the numbness. She feels more unstable due to the numbness at work as well. She also has history of seizures (takes keppra) and has had several seizures in the last month also. She is not sure if this is related. Has not called her neurologist.   PMH, [facility], social history reviewed and updated  Review of Systems  Constitutional: Positive for activity change.  HENT: Negative.   Eyes: Negative.   Respiratory: Negative for cough, chest tightness and shortness of breath.   Cardiovascular: Negative for chest pain, palpitations and leg swelling.  Gastrointestinal: Negative for abdominal distention, abdominal pain, constipation, diarrhea, nausea and vomiting.  Musculoskeletal: Positive for gait problem.  Skin: Negative.   Neurological: Positive for seizures and numbness.  Psychiatric/Behavioral: Negative.     Objective:  Physical Exam Constitutional:      Appearance: She is well-developed.  HENT:     Head: Normocephalic and atraumatic.  Neck:     Musculoskeletal: Normal range of motion.  Cardiovascular:     Rate and Rhythm: Normal rate and regular rhythm.  Pulmonary:     Effort: Pulmonary effort is normal. No respiratory distress.     Breath sounds: Normal breath sounds. No wheezing or rales.  Abdominal:     General: Bowel sounds are normal. There is no  distension.     Palpations: Abdomen is soft.     Tenderness: There is no abdominal tenderness. There is no rebound.  Musculoskeletal:        General: Tenderness present.  Skin:    General: Skin is warm and dry.  Neurological:     Mental Status: She is alert and oriented to person, place, and time.     Sensory: Sensory deficit present.     Coordination: Coordination abnormal.     Comments: Problems with balance with walking when initially standing, decrease in sensation both feet right worse than left, pain to palpation over the toes right foot, right lower leg with decreased sensation also     Vitals:   09/27/18 0928  BP: 118/64  Pulse: (!) 55  Temp: 97.7 F (36.5 C)  TempSrc: Oral  SpO2: 99%  Weight: 135 lb (61.2 kg)  Height: 5\' 2"  (1.575 m)    Assessment & Plan:  Visit time 40 minutes: greater than 50% of that time was spent in face to face counseling and coordination of care with the patient: counseled about possible new stroke, versus neuropathy of unknown origin, versus brain changes causing worsening seizures as well as workup and management and treatment options in the meantime.   Flu shot given at visit

## 2018-09-27 NOTE — Assessment & Plan Note (Signed)
Have asked her to reach out to her neurologist about increasing seizures. Getting MRI to rule out new stroke or other changes which could impact her seizure disorder.

## 2018-09-27 NOTE — Assessment & Plan Note (Signed)
Given the sudden change in her symptoms she needs evaluation. Checking CBC, CMP, thyroid, B12, HgA1c, vitamin D to look for metabolic causes for the numbness. Checking MRI to look for new stroke or brain change.

## 2018-09-28 ENCOUNTER — Other Ambulatory Visit: Payer: Self-pay | Admitting: Internal Medicine

## 2018-10-11 ENCOUNTER — Ambulatory Visit (HOSPITAL_COMMUNITY)
Admission: EM | Admit: 2018-10-11 | Discharge: 2018-10-11 | Disposition: A | Discharge status: Other Acute Inpatient Hospital | Payer: Medicare Other | Source: Home / Self Care

## 2018-10-11 ENCOUNTER — Inpatient Hospital Stay (HOSPITAL_COMMUNITY): Payer: Medicare Other

## 2018-10-11 ENCOUNTER — Emergency Department (HOSPITAL_COMMUNITY): Payer: Medicare Other

## 2018-10-11 ENCOUNTER — Observation Stay (HOSPITAL_COMMUNITY)
Admission: EM | Admit: 2018-10-11 | Discharge: 2018-10-12 | Disposition: A | Discharge status: Home / Self Care | Payer: Medicare Other | Attending: Internal Medicine | Admitting: Internal Medicine

## 2018-10-11 ENCOUNTER — Other Ambulatory Visit: Payer: Self-pay

## 2018-10-11 DIAGNOSIS — I10 Essential (primary) hypertension: Secondary | ICD-10-CM

## 2018-10-11 DIAGNOSIS — E785 Hyperlipidemia, unspecified: Secondary | ICD-10-CM | POA: Diagnosis not present

## 2018-10-11 DIAGNOSIS — Z79899 Other long term (current) drug therapy: Secondary | ICD-10-CM | POA: Diagnosis not present

## 2018-10-11 DIAGNOSIS — R4701 Aphasia: Secondary | ICD-10-CM

## 2018-10-11 DIAGNOSIS — G40909 Epilepsy, unspecified, not intractable, without status epilepticus: Secondary | ICD-10-CM | POA: Insufficient documentation

## 2018-10-11 DIAGNOSIS — Z859 Personal history of malignant neoplasm, unspecified: Secondary | ICD-10-CM | POA: Diagnosis not present

## 2018-10-11 DIAGNOSIS — R2990 Unspecified symptoms and signs involving the nervous system: Secondary | ICD-10-CM | POA: Diagnosis not present

## 2018-10-11 DIAGNOSIS — G629 Polyneuropathy, unspecified: Secondary | ICD-10-CM | POA: Insufficient documentation

## 2018-10-11 DIAGNOSIS — G2581 Restless legs syndrome: Secondary | ICD-10-CM | POA: Insufficient documentation

## 2018-10-11 DIAGNOSIS — G459 Transient cerebral ischemic attack, unspecified: Principal | ICD-10-CM | POA: Insufficient documentation

## 2018-10-11 DIAGNOSIS — F339 Major depressive disorder, recurrent, unspecified: Secondary | ICD-10-CM | POA: Insufficient documentation

## 2018-10-11 DIAGNOSIS — Z885 Allergy status to narcotic agent status: Secondary | ICD-10-CM | POA: Diagnosis not present

## 2018-10-11 DIAGNOSIS — R569 Unspecified convulsions: Secondary | ICD-10-CM

## 2018-10-11 DIAGNOSIS — F17210 Nicotine dependence, cigarettes, uncomplicated: Secondary | ICD-10-CM | POA: Insufficient documentation

## 2018-10-11 DIAGNOSIS — Z7982 Long term (current) use of aspirin: Secondary | ICD-10-CM | POA: Insufficient documentation

## 2018-10-11 DIAGNOSIS — Z791 Long term (current) use of non-steroidal anti-inflammatories (NSAID): Secondary | ICD-10-CM | POA: Diagnosis not present

## 2018-10-11 DIAGNOSIS — Z20828 Contact with and (suspected) exposure to other viral communicable diseases: Secondary | ICD-10-CM | POA: Insufficient documentation

## 2018-10-11 DIAGNOSIS — I69351 Hemiplegia and hemiparesis following cerebral infarction affecting right dominant side: Secondary | ICD-10-CM | POA: Insufficient documentation

## 2018-10-11 DIAGNOSIS — I639 Cerebral infarction, unspecified: Secondary | ICD-10-CM | POA: Diagnosis present

## 2018-10-11 DIAGNOSIS — F331 Major depressive disorder, recurrent, moderate: Secondary | ICD-10-CM | POA: Diagnosis present

## 2018-10-11 LAB — COMPREHENSIVE METABOLIC PANEL
ALT: 21 U/L (ref 0–44)
AST: 27 U/L (ref 15–41)
Albumin: 3.6 g/dL (ref 3.5–5.0)
Alkaline Phosphatase: 66 U/L (ref 38–126)
Anion gap: 11 (ref 5–15)
BUN: 10 mg/dL (ref 8–23)
CO2: 27 mmol/L (ref 22–32)
Calcium: 9.1 mg/dL (ref 8.9–10.3)
Chloride: 95 mmol/L — ABNORMAL LOW (ref 98–111)
Creatinine, Ser: 0.7 mg/dL (ref 0.44–1.00)
GFR calc Af Amer: >60 mL/min (ref >60)
GFR calc non Af Amer: >60 mL/min (ref >60)
Glucose, Bld: 115 mg/dL — ABNORMAL HIGH (ref 70–99)
Potassium: 3.8 mmol/L (ref 3.5–5.1)
Sodium: 133 mmol/L — ABNORMAL LOW (ref 135–145)
Total Bilirubin: 0.3 mg/dL (ref 0.3–1.2)
Total Protein: 6.7 g/dL (ref 6.5–8.1)

## 2018-10-11 LAB — DIFFERENTIAL
Abs Immature Granulocytes: 0.01 10*3/uL (ref 0.00–0.07)
Basophils Absolute: 0 10*3/uL (ref 0.0–0.1)
Basophils Relative: 1 %
Eosinophils Absolute: 0.1 10*3/uL (ref 0.0–0.5)
Eosinophils Relative: 1 %
Immature Granulocytes: 0 %
Lymphocytes Relative: 37 %
Lymphs Abs: 1.8 10*3/uL (ref 0.7–4.0)
Monocytes Absolute: 0.4 10*3/uL (ref 0.1–1.0)
Monocytes Relative: 8 %
Neutro Abs: 2.6 10*3/uL (ref 1.7–7.7)
Neutrophils Relative %: 53 %

## 2018-10-11 LAB — APTT: aPTT: 30 seconds (ref 24–36)

## 2018-10-11 LAB — URINALYSIS, ROUTINE W REFLEX MICROSCOPIC
Bilirubin Urine: NEGATIVE
Glucose, UA: NEGATIVE mg/dL
Hgb urine dipstick: NEGATIVE
Ketones, ur: NEGATIVE mg/dL
Leukocytes,Ua: NEGATIVE
Nitrite: NEGATIVE
Protein, ur: NEGATIVE mg/dL
Specific Gravity, Urine: 1.02 (ref 1.005–1.030)
pH: 6 (ref 5.0–8.0)

## 2018-10-11 LAB — RAPID URINE DRUG SCREEN, HOSP PERFORMED
Amphetamines: NOT DETECTED
Barbiturates: NOT DETECTED
Benzodiazepines: POSITIVE — AB
Cocaine: NOT DETECTED
Opiates: NOT DETECTED
Tetrahydrocannabinol: NOT DETECTED

## 2018-10-11 LAB — PROTIME-INR
INR: 1 (ref 0.8–1.2)
Prothrombin Time: 12.6 seconds (ref 11.4–15.2)

## 2018-10-11 LAB — I-STAT CHEM 8, ED
BUN: 10 mg/dL (ref 8–23)
Calcium, Ion: 1.19 mmol/L (ref 1.15–1.40)
Chloride: 95 mmol/L — ABNORMAL LOW (ref 98–111)
Creatinine, Ser: 0.6 mg/dL (ref 0.44–1.00)
Glucose, Bld: 110 mg/dL — ABNORMAL HIGH (ref 70–99)
HCT: 43 % (ref 36.0–46.0)
Hemoglobin: 14.6 g/dL (ref 12.0–15.0)
Potassium: 3.8 mmol/L (ref 3.5–5.1)
Sodium: 133 mmol/L — ABNORMAL LOW (ref 135–145)
TCO2: 28 mmol/L (ref 22–32)

## 2018-10-11 LAB — CBC
HCT: 37.2 % (ref 36.0–46.0)
Hemoglobin: 12.7 g/dL (ref 12.0–15.0)
MCH: 33.2 pg (ref 26.0–34.0)
MCHC: 34.1 g/dL (ref 30.0–36.0)
MCV: 97.1 fL (ref 80.0–100.0)
Platelets: 183 10*3/uL (ref 150–400)
RBC: 3.83 MIL/uL — ABNORMAL LOW (ref 3.87–5.11)
RDW: 12.1 % (ref 11.5–15.5)
WBC: 4.8 10*3/uL (ref 4.0–10.5)
nRBC: 0 % (ref 0.0–0.2)

## 2018-10-11 LAB — CBG MONITORING, ED: Glucose-Capillary: 119 mg/dL — ABNORMAL HIGH (ref 70–99)

## 2018-10-11 LAB — ETHANOL: Alcohol, Ethyl (B): <10 mg/dL (ref <10)

## 2018-10-11 LAB — SARS CORONAVIRUS 2 BY RT PCR (HOSPITAL ORDER, PERFORMED IN ~~LOC~~ HOSPITAL LAB): SARS Coronavirus 2: NEGATIVE

## 2018-10-11 MED ORDER — ACETAMINOPHEN 650 MG RE SUPP: 650.0000 mg | RECTAL | Status: DC | PRN

## 2018-10-11 MED ORDER — STROKE: EARLY STAGES OF RECOVERY BOOK: Freq: Once | Status: DC

## 2018-10-11 MED ORDER — ESCITALOPRAM OXALATE 10 MG PO TABS
20.0000 mg | ORAL_TABLET | Freq: Every day | ORAL | Status: DC
  Administered 2018-10-11 – 2018-10-12 (×2): 20 mg via ORAL
  Filled 2018-10-11 (×2): qty 2

## 2018-10-11 MED ORDER — ACETAMINOPHEN 325 MG PO TABS: 650.0000 mg | ORAL_TABLET | ORAL | Status: DC | PRN

## 2018-10-11 MED ORDER — ALBUTEROL SULFATE HFA 108 (90 BASE) MCG/ACT IN AERS
2.0000 | INHALATION_SPRAY | Freq: Four times a day (QID) | RESPIRATORY_TRACT | Status: DC | PRN
  Filled 2018-10-11: qty 6.7

## 2018-10-11 MED ORDER — ASPIRIN 325 MG PO TABS
325.0000 mg | ORAL_TABLET | Freq: Every day | ORAL | Status: DC
  Administered 2018-10-11 – 2018-10-12 (×2): 325 mg via ORAL
  Filled 2018-10-11 (×2): qty 1

## 2018-10-11 MED ORDER — TEMAZEPAM 15 MG PO CAPS
15.0000 mg | ORAL_CAPSULE | Freq: Every evening | ORAL | Status: DC | PRN
  Administered 2018-10-11: 15 mg via ORAL
  Filled 2018-10-11: qty 1

## 2018-10-11 MED ORDER — LEVETIRACETAM IN NACL 1000 MG/100ML IV SOLN
1000.0000 mg | Freq: Once | INTRAVENOUS | Status: AC
  Administered 2018-10-11: 1000 mg via INTRAVENOUS
  Filled 2018-10-11: qty 100

## 2018-10-11 MED ORDER — GABAPENTIN 100 MG PO CAPS
100.0000 mg | ORAL_CAPSULE | Freq: Three times a day (TID) | ORAL | Status: DC
  Administered 2018-10-11 – 2018-10-12 (×3): 100 mg via ORAL
  Filled 2018-10-11 (×3): qty 1

## 2018-10-11 MED ORDER — STROKE: EARLY STAGES OF RECOVERY BOOK
Freq: Once | Status: AC
  Administered 2018-10-12: 03:00:00
  Filled 2018-10-11 (×2): qty 1

## 2018-10-11 MED ORDER — ENOXAPARIN SODIUM 40 MG/0.4ML ~~LOC~~ SOLN
40.0000 mg | SUBCUTANEOUS | Status: DC
  Administered 2018-10-11: 40 mg via SUBCUTANEOUS
  Filled 2018-10-11: qty 0.4

## 2018-10-11 MED ORDER — ATORVASTATIN CALCIUM 80 MG PO TABS
80.0000 mg | ORAL_TABLET | Freq: Every day | ORAL | Status: DC
  Administered 2018-10-11 – 2018-10-12 (×2): 80 mg via ORAL
  Filled 2018-10-11 (×2): qty 1

## 2018-10-11 MED ORDER — SENNOSIDES-DOCUSATE SODIUM 8.6-50 MG PO TABS: 1.0000 | ORAL_TABLET | Freq: Every evening | ORAL | Status: DC | PRN

## 2018-10-11 MED ORDER — ADULT MULTIVITAMIN W/MINERALS CH
1.0000 | ORAL_TABLET | Freq: Every day | ORAL | Status: DC
  Administered 2018-10-11 – 2018-10-12 (×2): 1 via ORAL
  Filled 2018-10-11 (×2): qty 1

## 2018-10-11 MED ORDER — ASPIRIN 300 MG RE SUPP: 300.0000 mg | Freq: Every day | RECTAL | Status: DC

## 2018-10-11 MED ORDER — NICOTINE 14 MG/24HR TD PT24
14.0000 mg | MEDICATED_PATCH | Freq: Every day | TRANSDERMAL | Status: DC
  Administered 2018-10-12 (×2): 14 mg via TRANSDERMAL
  Filled 2018-10-11 (×2): qty 1

## 2018-10-11 MED ORDER — ACETAMINOPHEN 160 MG/5ML PO SOLN: 650.0000 mg | ORAL | Status: DC | PRN

## 2018-10-11 MED ORDER — IOHEXOL 350 MG/ML SOLN
100.0000 mL | Freq: Once | INTRAVENOUS | Status: AC | PRN
  Administered 2018-10-11: 100 mL via INTRAVENOUS

## 2018-10-11 MED ORDER — LEVETIRACETAM 500 MG PO TABS
500.0000 mg | ORAL_TABLET | Freq: Two times a day (BID) | ORAL | Status: DC
  Administered 2018-10-11 – 2018-10-12 (×2): 500 mg via ORAL
  Filled 2018-10-11 (×2): qty 1

## 2018-10-11 MED ORDER — HYDROCHLOROTHIAZIDE 12.5 MG PO CAPS
12.5000 mg | ORAL_CAPSULE | Freq: Every day | ORAL | Status: DC
  Administered 2018-10-12 (×2): 12.5 mg via ORAL
  Filled 2018-10-11 (×2): qty 1

## 2018-10-11 MED ORDER — PRAMIPEXOLE DIHYDROCHLORIDE 0.25 MG PO TABS
0.5000 mg | ORAL_TABLET | Freq: Every evening | ORAL | Status: DC | PRN
  Filled 2018-10-11: qty 2

## 2018-10-11 MED ORDER — ASPIRIN EC 325 MG PO TBEC: 325.0000 mg | DELAYED_RELEASE_TABLET | Freq: Every day | ORAL | Status: DC

## 2018-10-11 NOTE — Progress Notes (Signed)
EEG complete - results pending 

## 2018-10-11 NOTE — Consult Note (Addendum)
Neurology Consultation  Reason for Consult: Code stroke Referring Physician: Dr. Darl Householder  CC: Dysarthria  History is obtained from:  HPI: Anna Lyons is a 63 y.o. female with a history of tobacco abuse, TIA, stroke, seizures who is on 500 mg twice daily, restless leg syndrome, hypertension, depression.  Patient was last spoken to at approximately 3-4 o'clock on 10/10/2018 and was talking clearly to the daughter.  She has not spoken to until approximately 1400 hrs. today.  The daughter noted that she was severely slurred and patient was discussing the fact that she felt unsteady and not herself.  Daughter picked her up and noted that she was slightly weaker on the right arm but it was really the speech that was abnormal.  For this reason she drove her to the emergency department.  Unfortunate patient was out of the window for TPA and in addition she did not fit the criteria for LVO and interventional treatment.   ED course: CT/CTA of head and neck   Chart review: Patient was seen on 04/20/2011 at that time for right-sided hemiparesis and slurred speech.  She was given TPA at that time.  At that time she had a right facial droop, dysarthria and right hemiparesis.  Work-up included: - CT of brain which showed stable -Cerebral angiogram which showed no occlusions, dissections or stenosis or filling defects - MRI of the brain which showed a nonhemorrhagic infarct involving the posterior left lentiform nucleus and more remote lacunar infarcts that were present in the basal ganglia bilaterally. -2D echo at that time showed an EF of 55 to 60% with no source of emboli -Carotid Dopplers were patent  Exam at that time showed mild right lower facial weakness.  Mild right upper and lower extremity drift with 4/5 strength.  On 05/19/2012 patient was seen for seizure.  Patient was started on Keppra.  Patient had normal EEG.  She was discharged on Keppra 500 mg twice daily  . On 05/26/2015 it appears that she is  using baclofen for spasticity on the right side.  She was also on Klonopin to help with restless leg syndrome.  She had had no further seizures her Keppra was decreased to 250 twice daily by Dr. city.  On 03/29/2016 patient continued to have seizures and her Keppra was increased to 500 mg twice daily.  Patient was also on Wellbutrin at that time.    LKW: 1500 hrs. on 10/10/2018 tpa given?: no, out of window Premorbid modified Rankin scale (mRS): 1 NIH stroke score of 3   ROS: A 14 point ROS was performed and is negative except as noted in the HPI.   Past Medical History:  Diagnosis Date  . Angina   . Breast cyst   . Cancer ([place])   . Depression   . Hypertension   . RLS (restless legs syndrome) 11/16/2011  . Seizures ([place]) 2016   last seizure="last year, 2016" per pt.  . Stroke ([place]) 04/20/2011   a. 04/20/11 Left subcortical infarct treated w/ TPA;  b. 04/2011 Echo: EF 55-60%;  c. 04/2011 Normal Carotid u/s  d. Residual "walk w/limp on right; unable to grasp w/right hand"  . TIA (transient ischemic attack) 08/2010  . Tobacco abuse    a. quit @ time of CVA 04/2011.     Family History  Problem Relation Age of Onset  . Lupus Mother        died early 24's.  . Emphysema Father  died late 85's.  [name] COPD Father   . Pancreatic cancer Brother   . Bladder Cancer Brother   . Rectal cancer Brother   . Suicidality Brother   . Colon cancer Neg Hx    Social History:   reports that she has been smoking cigarettes. She has a 18.50 pack-year smoking history. She has never used smokeless tobacco. She reports current alcohol use of about 7.0 standard drinks of alcohol per week. She reports that she does not use drugs.  Medications No current facility-administered medications for this encounter.   Current Outpatient Medications:  .  albuterol (PROVENTIL HFA;VENTOLIN HFA) 108 (90 Base) MCG/ACT inhaler, Inhale 2 puffs into the lungs every 6 (six) hours as needed for wheezing or shortness of  breath., Disp: 1 Inhaler, Rfl: 0 .  aspirin EC 325 MG tablet, Take 325 mg by mouth daily., Disp: , Rfl:  .  atorvastatin (LIPITOR) 80 MG tablet, Take 1 tablet (80 mg total) by mouth daily. Need visit before next refill, Disp: 90 tablet, Rfl: 0 .  baclofen (LIORESAL) 20 MG tablet, TAKE 1/2 (ONE-HALF) TABLET BY MOUTH THREE TIMES DAILY FOR 7 DAYS AND THEN  1 TABLET THREE TIMES DAILY (Patient taking differently: Take 20 mg by mouth 3 (three) times daily. ), Disp: 90 each, Rfl: 3 .  escitalopram (LEXAPRO) 20 MG tablet, Take 1 tablet by mouth once daily, Disp: 90 tablet, Rfl: 1 .  gabapentin (NEURONTIN) 100 MG capsule, Take 1 capsule (100 mg total) by mouth 3 (three) times daily., Disp: 90 capsule, Rfl: 3 .  hydrochlorothiazide (MICROZIDE) 12.5 MG capsule, Take 1 capsule (12.5 mg total) by mouth daily., Disp: 90 capsule, Rfl: 3 .  ibuprofen (ADVIL,MOTRIN) 800 MG tablet, Take 1 tablet by mouth every 8 (eight) hours as needed for moderate pain. , Disp: , Rfl: 1 .  levETIRAcetam (KEPPRA) 500 MG tablet, Take 1 tablet (500 mg total) by mouth 2 (two) times daily., Disp: 180 tablet, Rfl: 3 .  Multiple Vitamin (MULTIVITAMIN WITH MINERALS) TABS tablet, Take 1 tablet by mouth daily., Disp: , Rfl:  .  nicotine (NICODERM CQ - DOSED IN MG/24 HOURS) 14 mg/24hr patch, Place 1 patch (14 mg total) onto the skin daily. (Patient not taking: Reported on 07/21/2018), Disp: 28 patch, Rfl: 0 .  pramipexole (MIRAPEX) 0.5 MG tablet, Take 1 tablet (0.5 mg total) by mouth at bedtime as needed., Disp: 90 tablet, Rfl: 1 .  temazepam (RESTORIL) 15 MG capsule, Take 1-2 capsules (15-30 mg total) by mouth at bedtime as needed for sleep., Disp: 60 capsule, Rfl: 2 .  VIIBRYD 40 MG TABS, Take 1 tablet by mouth once daily, Disp: 90 tablet, Rfl: 1   Exam: Current vital signs: BP 123/72 (BP Location: Right Arm)   Pulse 62   Temp 98.6 F (37 C) (Oral)   Resp 13   Ht 5\' 2"  (1.575 m)   Wt 61.2 kg   SpO2 97%   BMI 24.69 kg/m  Vital signs  in last 24 hours: Temp:  [98.6 F (37 C)] 98.6 F (37 C) (09/09 1616) Pulse Rate:  [62] 62 (09/09 1616) Resp:  [13] 13 (09/09 1616) BP: (123)/(72) 123/72 (09/09 1616) SpO2:  [97 %] 97 % (09/09 1616) Weight:  [61.2 kg] 61.2 kg (09/09 1616)  Physical Exam  Constitutional: Appears well-developed and well-nourished.  Psych: Affect appropriate to situation Eyes: No scleral injection HENT: No OP obstrucion Head: Normocephalic.  Cardiovascular: Normal rate and regular rhythm.  Respiratory: Effort normal, non-labored breathing  GI: Soft.  No distension. There is no tenderness.  Skin: WDI  Neuro: Mental Status: Patient is awake, alert, oriented to person, age. Unable to give a full clear history Positive dysarthria Cranial Nerves: II: Visual Fields are full.  III,IV, VI: EOMI without ptosis or diploplia. Pupils equal, round and reactive to light V: Facial sensation is symmetric to temperature VII: Facial movement is symmetric.  VIII: hearing is intact to voice X: Palat elevates symmetrically XI: Shoulder shrug is symmetric. XII: tongue is midline without atrophy or fasciculations.  Motor: Tone is normal. Bulk is normal. 5/5 strength was present on the left upper and lower extremities.  Patient had 4/5 strength on the right upper and lower extremity.  Patient did have a drift on the right upper and lower extremity Sensory: Sensation is symmetric to light touch and temperature in the arms and legs. Deep Tendon Reflexes: 2+ and symmetric in the biceps and patellae. Plantars: Toes are downgoing bilaterally.  Cerebellar: Finger-nose and heel-to-shin were intact bilaterally  Labs I have reviewed labs in epic and the results pertinent to this consultation are:   CBC    Component Value Date/Time   WBC 4.8 10/11/2018 1617   RBC 3.83 (L) 10/11/2018 1617   HGB 14.6 10/11/2018 1634   HCT 43.0 10/11/2018 1634   PLT 183 10/11/2018 1617   MCV 97.1 10/11/2018 1617   MCH 33.2  10/11/2018 1617   MCHC 34.1 10/11/2018 1617   RDW 12.1 10/11/2018 1617   LYMPHSABS 1.8 10/11/2018 1617   MONOABS 0.4 10/11/2018 1617   EOSABS 0.1 10/11/2018 1617   BASOSABS 0.0 10/11/2018 1617    CMP     Component Value Date/Time   NA 133 (L) 10/11/2018 1634   K 3.8 10/11/2018 1634   CL 95 (L) 10/11/2018 1634   CO2 30 09/27/2018 1013   GLUCOSE 110 (H) 10/11/2018 1634   BUN 10 10/11/2018 1634   CREATININE 0.60 10/11/2018 1634   CALCIUM 9.8 09/27/2018 1013   PROT 7.3 09/27/2018 1013   ALBUMIN 4.3 09/27/2018 1013   AST 22 09/27/2018 1013   ALT 15 09/27/2018 1013   ALKPHOS 68 09/27/2018 1013   BILITOT 0.6 09/27/2018 1013   GFRNONAA >60 07/19/2018 0509   GFRAA >60 07/19/2018 0509    Lipid Panel     Component Value Date/Time   CHOL 114 09/27/2018 1013   TRIG 91.0 09/27/2018 1013   HDL 59.00 09/27/2018 1013   CHOLHDL 2 09/27/2018 1013   VLDL 18.2 09/27/2018 1013   LDLCALC 37 09/27/2018 1013     Imaging I have reviewed the images obtained:  CT-scan of the brain- no acute abnormalities within aspects of 10.  Chronic small vessel ischemia most notable in the left basal ganglia and corona radiata, unchanged from last studies.  CTA of head and neck- negative for large vessel occlusion, no significant carotid or vertebral artery stenosis in the neck, no significant intracranial stenosis  MRI examination of the brain- pending  Etta Quill PA-C [facility] [phone]  M-F  (9:00 am- 5:00 PM)  10/11/2018, 4:46 PM     Assessment:   63 year old with previous stroke with mild dysarthria right facial droop and potentially some right-sided weakness-mostly in defined movements of her right hand, presenting for evaluation of slurred speech and difficulty speaking along with right-sided weakness with last known normal past the 24-hour mark making her ineligible for TPA as well as endovascular thrombectomy. On examination, no evidence of LVO on exam. CT and CTA with  no acute changes or LVO Likely a new small vessel stroke versus stroke recrudescence Given history of seizures, seizures also remain in the differential.  Impression - New stroke versus stroke recrudescence -Evaluate for seizure.  Recommend # MRI of the brain without contrast #Transthoracic Echo,   #Continue patient on ASA 325mg  daily,   #Start continue Atorvastatin 80 mg/other high intensity statin # BP goal: permissive HTN upto 220/120 mmHg # HBAIC and Lipid profile # Telemetry monitoring # Frequent neuro checks # NPO until passes stroke swallow screen #Seizure precautions # continue Keprra 500 mg BID  # please page stroke NP  Or  PA  Or MD from 8am -4 pm  as this patient from this time will be  followed by the stroke.   You can look them up on [email or web address]  Password [email or web address]

## 2018-10-11 NOTE — Code Documentation (Signed)
Upon further conversation,  Last known well changed to yesterday about 1500.  This afternoon about 2pm she had slurred speech during the phone conversation which prompted them seeking medical care.  Patient arrived via private vehicle at 1542.  Code stroke called at 1626  Stat head CT and stat labs done.  Dr Rory Percy at bedside to assess patient.  NIHSS 3,  Right arm and leg drift, mild slurred speech.  CTA head and neck done.  Hand off with ED RN Thurmond Butts. Neuro checks and VS q 2 hours x 12

## 2018-10-11 NOTE — ED Notes (Signed)
EEG in process-Monique,RN

## 2018-10-11 NOTE — ED Triage Notes (Addendum)
Pt states she beginning weak this morning at 0700. Pt states she felt fine prior to. Right sided weakness noted. Pt has had a previous stroke. Pt was able to drive this morning but was unable to place mask over mouth with right arm in triage. EDP/CN made aware. Labs drawn at triage.

## 2018-10-11 NOTE — ED Provider Notes (Signed)
[facility] EMERGENCY DEPARTMENT Provider Note   CSN: [id number] Arrival date & time: 10/11/18  1543  An emergency department physician performed an initial assessment on this suspected stroke patient at 1618.  History   Chief Complaint Chief Complaint  Patient presents with   Code Stroke    HPI Anna Lyons is a 63 y.o. female history of hypertension, seizures, previous stroke here presenting with weakness, trouble speaking.  Patient states that she fell asleep for her morning nap around 9:30 AM.  She states that she woke up around 11:30 AM and had some trouble speaking and felt weaker at that time.  Family noticed that she had slurred speech and right-sided weakness she was brought in by private vehicle.  Nurse was concerned that she had a stroke and code stroke LVO was activated by me. She is outside of TPA window but still has slurred speech and right sided weakness on arrival.      The history is provided by the patient.    Past Medical History:  Diagnosis Date   Angina    Breast cyst    Cancer ([place])    Depression    Hypertension    RLS (restless legs syndrome) 11/16/2011   Seizures ([place]) 2016   last seizure="last year, 2016" per pt.   Stroke ([place]) 04/20/2011   a. 04/20/11 Left subcortical infarct treated w/ TPA;  b. 04/2011 Echo: EF 55-60%;  c. 04/2011 Normal Carotid u/s  d. Residual "walk w/limp on right; unable to grasp w/right hand"   TIA (transient ischemic attack) 08/2010   Tobacco abuse    a. quit @ time of CVA 04/2011.    Patient Active Problem List   Diagnosis Date Noted   Numbness of right foot 09/27/2018   Atypical chest pain 07/19/2018   Acute respiratory distress 07/19/2018   Epigastric abdominal tenderness without rebound tenderness 02/13/2018   PTSD (post-traumatic stress disorder) 04/01/2017   History of stroke 02/17/2017   Malaise and fatigue 11/02/2016   Eye problem 05/04/2016   Cough 03/04/2016   Abdominal  pain, epigastric 10/07/2015   Paresthesia of both feet 03/09/2015   Routine general medical examination at a health care facility 11/15/2014   Breast pain in female 12/21/2013   Dizziness 08/16/2013   Adhesive capsulitis of right shoulder 06/18/2013   Hemiparesis affecting right side as late effect of cerebrovascular accident ([place]) 06/18/2013   Seizure disorder ([place]) 05/19/2012   NSTEMI, initial episode of care; Type 2 05/19/2012   Anemia 11/17/2011   Hypokalemia 11/16/2011   RLS (restless legs syndrome) 11/16/2011   Abnormal ECG 05/19/2011   Essential hypertension, benign 05/19/2011   Tobacco abuse 05/19/2011   CVA (cerebral infarction) 04/23/2011   Major depression, recurrent ([place])     Past Surgical History:  Procedure Laterality Date   BREAST RECONSTRUCTION     bilaterally   CARPAL TUNNEL RELEASE Left 1990's   CARPAL TUNNEL RELEASE Left 12/08/2015   CESAREAN SECTION  1979   KNEE ARTHROSCOPY Left 1990's    cartilage repair   LEFT HEART CATHETERIZATION WITH CORONARY ANGIOGRAM N/A 05/20/2011   Procedure: LEFT HEART CATHETERIZATION WITH CORONARY ANGIOGRAM;  Surgeon: Josue Hector, MD;  Location: [facility] CATH LAB;  Service: Cardiovascular;  Laterality: N/A;   MASTECTOMY Bilateral 1980's   bilateral with reconstruction     OB History   No obstetric history on file.      Home Medications    Prior to Admission medications   Medication Sig Start  Date End Date Taking? Authorizing Provider  albuterol (PROVENTIL HFA;VENTOLIN HFA) 108 (90 Base) MCG/ACT inhaler Inhale 2 puffs into the lungs every 6 (six) hours as needed for wheezing or shortness of breath. 12/28/16   Hoyt Koch, MD  aspirin EC 325 MG tablet Take 325 mg by mouth daily.    [provider]  atorvastatin (LIPITOR) 80 MG tablet Take 1 tablet (80 mg total) by mouth daily. Need visit before next refill 07/20/18   Kayleen Memos, DO  baclofen (LIORESAL) 20 MG tablet TAKE 1/2 (ONE-HALF)  TABLET BY MOUTH THREE TIMES DAILY FOR 7 DAYS AND THEN  1 TABLET THREE TIMES DAILY Patient taking differently: Take 20 mg by mouth 3 (three) times daily.  05/01/18   Hoyt Koch, MD  escitalopram (LEXAPRO) 20 MG tablet Take 1 tablet by mouth once daily 09/29/18   Hoyt Koch, MD  gabapentin (NEURONTIN) 100 MG capsule Take 1 capsule (100 mg total) by mouth 3 (three) times daily. 09/27/18   Hoyt Koch, MD  hydrochlorothiazide (MICROZIDE) 12.5 MG capsule Take 1 capsule (12.5 mg total) by mouth daily. 04/07/18   Hoyt Koch, MD  ibuprofen (ADVIL,MOTRIN) 800 MG tablet Take 1 tablet by mouth every 8 (eight) hours as needed for moderate pain.  11/13/15   [provider]  levETIRAcetam (KEPPRA) 500 MG tablet Take 1 tablet (500 mg total) by mouth 2 (two) times daily. 11/25/17   Garvin Fila, MD  Multiple Vitamin (MULTIVITAMIN WITH MINERALS) TABS tablet Take 1 tablet by mouth daily.    [provider]  nicotine (NICODERM CQ - DOSED IN MG/24 HOURS) 14 mg/24hr patch Place 1 patch (14 mg total) onto the skin daily. Patient not taking: Reported on 07/21/2018 07/21/18   Kayleen Memos, DO  pramipexole (MIRAPEX) 0.5 MG tablet Take 1 tablet (0.5 mg total) by mouth at bedtime as needed. 07/21/18   Hoyt Koch, MD  temazepam (RESTORIL) 15 MG capsule Take 1-2 capsules (15-30 mg total) by mouth at bedtime as needed for sleep. 05/29/18   Hoyt Koch, MD  VIIBRYD 40 MG TABS Take 1 tablet by mouth once daily 09/29/18   Hoyt Koch, MD    Family History Family History  Problem Relation Age of Onset   Lupus Mother        died early 49's.   Emphysema Father        died late 23's.   COPD Father    Pancreatic cancer Brother    Bladder Cancer Brother    Rectal cancer Brother    Suicidality Brother    Colon cancer Neg Hx     Social History Social History   Tobacco Use   Smoking status: Current Every Day Smoker    Packs/day:  0.50    Years: 37.00    Pack years: 18.50    Types: Cigarettes   Smokeless tobacco: Never Used  Substance Use Topics   Alcohol use: Yes    Alcohol/week: 7.0 standard drinks    Types: 7 Glasses of wine per week    Comment: very occasional   Drug use: No     Allergies   Codeine   Review of Systems Review of Systems  Neurological: Positive for speech difficulty and weakness.  All other systems reviewed and are negative.    Physical Exam Updated Vital Signs BP (!) 160/78 (BP Location: Right Arm)    Pulse (!) 27    Temp 98.6 F (37 C) (Oral)  Resp 14    Ht 5\' 2"  (1.575 m)    Wt 61.2 kg    SpO2 90%    BMI 24.69 kg/m   Physical Exam Vitals signs and nursing note reviewed.  Constitutional:      Comments: Some expressive aphasia   HENT:     Head: Normocephalic.     Right Ear: Tympanic membrane normal.     Left Ear: Tympanic membrane normal.     Nose: Nose normal.     Mouth/Throat:     Mouth: Mucous membranes are moist.  Eyes:     Extraocular Movements: Extraocular movements intact.     Pupils: Pupils are equal, round, and reactive to light.  Neck:     Musculoskeletal: Normal range of motion.  Cardiovascular:     Rate and Rhythm: Normal rate and regular rhythm.     Pulses: Normal pulses.     Heart sounds: Normal heart sounds.  Pulmonary:     Effort: Pulmonary effort is normal.     Breath sounds: Normal breath sounds.  Abdominal:     General: Abdomen is flat.     Palpations: Abdomen is soft.  Musculoskeletal: Normal range of motion.  Skin:    General: Skin is warm.  Neurological:     Comments: Some expressive aphasia. + pronator drift right arm. Strength 4/5 R arm and leg, 5/5 L arm and leg.   Psychiatric:        Mood and Affect: Mood normal.      ED Treatments / Results  Labs (all labs ordered are listed, but only abnormal results are displayed) Labs Reviewed  CBC - Abnormal; Notable for the following components:      Result Value   RBC 3.83 (*)      All other components within normal limits  COMPREHENSIVE METABOLIC PANEL - Abnormal; Notable for the following components:   Sodium 133 (*)    Chloride 95 (*)    Glucose, Bld 115 (*)    All other components within normal limits  CBG MONITORING, ED - Abnormal; Notable for the following components:   Glucose-Capillary 119 (*)    All other components within normal limits  I-STAT CHEM 8, ED - Abnormal; Notable for the following components:   Sodium 133 (*)    Chloride 95 (*)    Glucose, Bld 110 (*)    All other components within normal limits  PROTIME-INR  APTT  DIFFERENTIAL  ETHANOL  RAPID URINE DRUG SCREEN, HOSP PERFORMED  URINALYSIS, ROUTINE W REFLEX MICROSCOPIC  HEMOGLOBIN A1C  LIPID PANEL    EKG EKG Interpretation  Date/Time:  Wednesday October 11 2018 16:14:49 EDT Ventricular Rate:  59 PR Interval:    QRS Duration: 107 QT Interval:  461 QTC Calculation: 445 R Axis:   -123 Text Interpretation:  Right and left arm electrode reversal, interpretation assumes no reversal Sinus or ectopic atrial rhythm Probable lateral infarct, age indeterminate No significant change since last tracing Confirmed by Wandra Arthurs [phone]) on 10/11/2018 4:46:31 PM   Radiology Ct Code Stroke Cta Head W/wo Contrast  Result Date: 10/11/2018 CLINICAL DATA:  Right-sided weakness. EXAM: CT ANGIOGRAPHY HEAD AND NECK TECHNIQUE: Multidetector CT imaging of the head and neck was performed using the standard protocol during bolus administration of intravenous contrast. Multiplanar CT image reconstructions and MIPs were obtained to evaluate the vascular anatomy. Carotid stenosis measurements (when applicable) are obtained utilizing NASCET criteria, using the distal internal carotid diameter as the denominator. CONTRAST:  157mL OMNIPAQUE  IOHEXOL 350 MG/ML SOLN COMPARISON:  CT head 10/11/2018 FINDINGS: CTA NECK FINDINGS Aortic arch: Standard branching. Imaged portion shows no evidence of aneurysm or dissection.  No significant stenosis of the major arch vessel origins. Right carotid system: Mild atherosclerotic calcification right common carotid artery and right carotid bifurcation without significant stenosis. Negative for dissection Left carotid system: Mild atherosclerotic calcification left carotid bifurcation without significant stenosis. Negative for dissection Vertebral arteries: Both vertebral arteries are patent to the basilar without stenosis. Skeleton: No acute abnormality Other neck: Negative Upper chest: Lung apices clear bilaterally. No mediastinal mass or adenopathy. Review of the MIP images confirms the above findings CTA HEAD FINDINGS Anterior circulation: Cavernous carotid widely patent bilaterally without significant stenosis. Anterior and middle cerebral arteries widely patent bilaterally without stenosis or occlusion. Posterior circulation: Both vertebral arteries patent to the basilar. PICA patent bilaterally. Basilar widely patent. Superior cerebellar and posterior cerebral arteries widely patent bilaterally. Posterior communicating artery patent bilaterally. Venous sinuses: Normal enhancement Anatomic variants: None Review of the MIP images confirms the above findings IMPRESSION: 1. Negative for emergent large vessel occlusion 2. No significant carotid or vertebral artery stenosis in the neck 3. No significant intracranial stenosis. Electronically Signed   By: Franchot Gallo M.D.   On: 10/11/2018 17:16   Ct Code Stroke Cta Neck W/wo Contrast  Result Date: 10/11/2018 CLINICAL DATA:  Right-sided weakness. EXAM: CT ANGIOGRAPHY HEAD AND NECK TECHNIQUE: Multidetector CT imaging of the head and neck was performed using the standard protocol during bolus administration of intravenous contrast. Multiplanar CT image reconstructions and MIPs were obtained to evaluate the vascular anatomy. Carotid stenosis measurements (when applicable) are obtained utilizing NASCET criteria, using the distal internal  carotid diameter as the denominator. CONTRAST:  155mL OMNIPAQUE IOHEXOL 350 MG/ML SOLN COMPARISON:  CT head 10/11/2018 FINDINGS: CTA NECK FINDINGS Aortic arch: Standard branching. Imaged portion shows no evidence of aneurysm or dissection. No significant stenosis of the major arch vessel origins. Right carotid system: Mild atherosclerotic calcification right common carotid artery and right carotid bifurcation without significant stenosis. Negative for dissection Left carotid system: Mild atherosclerotic calcification left carotid bifurcation without significant stenosis. Negative for dissection Vertebral arteries: Both vertebral arteries are patent to the basilar without stenosis. Skeleton: No acute abnormality Other neck: Negative Upper chest: Lung apices clear bilaterally. No mediastinal mass or adenopathy. Review of the MIP images confirms the above findings CTA HEAD FINDINGS Anterior circulation: Cavernous carotid widely patent bilaterally without significant stenosis. Anterior and middle cerebral arteries widely patent bilaterally without stenosis or occlusion. Posterior circulation: Both vertebral arteries patent to the basilar. PICA patent bilaterally. Basilar widely patent. Superior cerebellar and posterior cerebral arteries widely patent bilaterally. Posterior communicating artery patent bilaterally. Venous sinuses: Normal enhancement Anatomic variants: None Review of the MIP images confirms the above findings IMPRESSION: 1. Negative for emergent large vessel occlusion 2. No significant carotid or vertebral artery stenosis in the neck 3. No significant intracranial stenosis. Electronically Signed   By: Franchot Gallo M.D.   On: 10/11/2018 17:16   Ct Head Code Stroke Wo Contrast  Result Date: 10/11/2018 CLINICAL DATA:  Code stroke.  Right-sided weakness. EXAM: CT HEAD WITHOUT CONTRAST TECHNIQUE: Contiguous axial images were obtained from the base of the skull through the vertex without intravenous  contrast. COMPARISON:  CT head 10/10/2014.  MRI head 10/18/2017 FINDINGS: Brain: Negative for acute infarct.  Negative for hemorrhage or mass. Chronic ischemic changes in the basal ganglia bilaterally left greater than right similar to prior studies. Chronic ischemia  extending into the left corona radiata unchanged. Benign-appearing cyst right basal ganglia unchanged. Negative for hydrocephalus. Vascular: Negative for hyperdense vessel Skull: Negative Sinuses/Orbits: Negative Other: None ASPECTS ([place] Stroke Program Early CT Score) - Ganglionic level infarction (caudate, lentiform nuclei, internal capsule, insula, M1-M3 cortex): 7 - Supraganglionic infarction (M4-M6 cortex): 3 Total score (0-10 with 10 being normal): 10 IMPRESSION: 1. No acute abnormality 2. ASPECTS is 10 3. Chronic small vessel ischemia most notably in the left basal ganglia and corona radiata, unchanged from prior studies. 4. These results were called by telephone at the time of interpretation on 10/11/2018 at 4:54 pm to provider [facility] , who verbally acknowledged these results. Electronically Signed   By: Franchot Gallo M.D.   On: 10/11/2018 16:54    Procedures Procedures (including critical care time)  Medications Ordered in ED Medications   stroke: mapping our early stages of recovery book (has no administration in time range)  aspirin suppository 300 mg (has no administration in time range)    Or  aspirin tablet 325 mg (has no administration in time range)  levETIRAcetam (KEPPRA) IVPB 1000 mg/100 mL premix (has no administration in time range)  iohexol (OMNIPAQUE) 350 MG/ML injection 100 mL (100 mLs Intravenous Contrast Given 10/11/18 1651)     Initial Impression / Assessment and Plan / ED Course  I have reviewed the triage vital signs and the nursing notes.  Pertinent labs & imaging results that were available during my care of the patient were reviewed by me and considered in my medical decision making (see chart for  details).       Anna Lyons is a 63 y.o. female here with slurred speech, R arm and leg weakness. She is out of the TPA window but meets LVO criteria. Will activate code stroke LVO. Will get CTA head/neck. I talked to Dr. Rory Percy to see patient.   5:32 PM  Patient was seen by Dr. Rory Percy, who called daughter. The last normal is actually 2 pm yesterday. Unclear if seizure vs stroke. CTA unremarkable. He recommend loading with keppra and EEG and MRI in patient.    Final Clinical Impressions(s) / ED Diagnoses   Final diagnoses:  None    ED Discharge Orders    None       Drenda Freeze, MD 10/11/18 1733

## 2018-10-11 NOTE — H&P (Signed)
[facility]                                                                                    Patient Demographics  Anna Lyons, is a 63 y.o. female  CSN: [id number]  MRN: [id number]  DOB - 1955/07/08  Admit Date - 10/11/2018  Outpatient Primary MD for the patient is Hoyt Koch, MD   With History of -  Past Medical History:  Diagnosis Date  . Angina   . Breast cyst   . Cancer ([place])   . Depression   . Hypertension   . RLS (restless legs syndrome) 11/16/2011  . Seizures ([place]) 2016   last seizure="last year, 2016" per pt.  . Stroke ([place]) 04/20/2011   a. 04/20/11 Left subcortical infarct treated w/ TPA;  b. 04/2011 Echo: EF 55-60%;  c. 04/2011 Normal Carotid u/s  d. Residual "walk w/limp on right; unable to grasp w/right hand"  . TIA (transient ischemic attack) 08/2010  . Tobacco abuse    a. quit @ time of CVA 04/2011.      Past Surgical History:  Procedure Laterality Date  . BREAST RECONSTRUCTION     bilaterally  . CARPAL TUNNEL RELEASE Left 1990's  . CARPAL TUNNEL RELEASE Left 12/08/2015  . CESAREAN SECTION  1979  . KNEE ARTHROSCOPY Left 1990's    cartilage repair  . LEFT HEART CATHETERIZATION WITH CORONARY ANGIOGRAM N/A 05/20/2011   Procedure: LEFT HEART CATHETERIZATION WITH CORONARY ANGIOGRAM;  Surgeon: Josue Hector, MD;  Location: [facility] CATH LAB;  Service: Cardiovascular;  Laterality: N/A;  . MASTECTOMY Bilateral 1980's   bilateral with reconstruction    in for   Chief Complaint  Patient presents with  . Code Stroke     HPI  Anna Lyons  is a 63 y.o. female, with past medical history significant for CVA in 2013 status post TPA, years on Keppra 500 mg p.o. twice daily, restless leg syndrome, hypertension and depression who is presenting with 1 day history of slurring of speech and worsening right-sided weakness.  Had  work-up in 2013 for her right-sided hemiparesis and slurred speech at that time when MRI of the brain  showed a nonhemorrhagic  infarct involving the posterior left lentiform nucleus and more remote lacunar infarcts that were present in the basal ganglia bilaterally. Today work-up in the emergency room showed negative CT of the head, MRI pending. Neurology saw the patient.  She was out of the window for TPA.     Review of Systems    In addition to the HPI above,  No Fever-chills, No problems swallowing food or Liquids, No Chest pain, Cough or Shortness of Breath, No Abdominal pain, No Nausea or Vommitting, Bowel movements are regular, No Blood in stool or Urine, No dysuria, No new skin rashes or bruises, No new joints pains-aches,  No recent weight gain or loss, No polyuria, polydypsia or polyphagia, No significant Mental Stressors.  All systems were reviewed and were negative   Social History Social History   Tobacco Use  . Smoking status: Current Every Day Smoker    Packs/day: 0.50    Years: 37.00    Pack years:  18.50    Types: Cigarettes  . Smokeless tobacco: Never Used  Substance Use Topics  . Alcohol use: Yes    Alcohol/week: 7.0 standard drinks    Types: 7 Glasses of wine per week    Comment: very occasional     Family History Family History  Problem Relation Age of Onset  . Lupus Mother        died early 46's.  . Emphysema Father        died late 1's.  [name] COPD Father   . Pancreatic cancer Brother   . Bladder Cancer Brother   . Rectal cancer Brother   . Suicidality Brother   . Colon cancer Neg Hx      Prior to Admission medications   Medication Sig Start Date End Date Taking? Authorizing Provider  albuterol (PROVENTIL HFA;VENTOLIN HFA) 108 (90 Base) MCG/ACT inhaler Inhale 2 puffs into the lungs every 6 (six) hours as needed for wheezing or shortness of breath. 12/28/16  Yes Hoyt Koch, MD  aspirin EC 325 MG tablet Take 325 mg by mouth daily.   Yes [provider]  atorvastatin (LIPITOR) 80 MG tablet Take 1 tablet (80 mg total) by mouth daily. Need visit  before next refill 07/20/18  Yes Hall, Carole N, DO  baclofen (LIORESAL) 20 MG tablet TAKE 1/2 (ONE-HALF) TABLET BY MOUTH THREE TIMES DAILY FOR 7 DAYS AND THEN  1 TABLET THREE TIMES DAILY Patient taking differently: Take 20 mg by mouth 3 (three) times daily.  05/01/18  Yes Hoyt Koch, MD  gabapentin (NEURONTIN) 100 MG capsule Take 1 capsule (100 mg total) by mouth 3 (three) times daily. 09/27/18  Yes Hoyt Koch, MD  hydrochlorothiazide (MICROZIDE) 12.5 MG capsule Take 1 capsule (12.5 mg total) by mouth daily. 04/07/18  Yes Hoyt Koch, MD  ibuprofen (ADVIL,MOTRIN) 800 MG tablet Take 1 tablet by mouth every 8 (eight) hours as needed for moderate pain.  11/13/15  Yes [provider]  levETIRAcetam (KEPPRA) 500 MG tablet Take 1 tablet (500 mg total) by mouth 2 (two) times daily. 11/25/17  Yes Garvin Fila, MD  Multiple Vitamin (MULTIVITAMIN WITH MINERALS) TABS tablet Take 1 tablet by mouth daily.   Yes [provider]  pramipexole (MIRAPEX) 0.5 MG tablet Take 1 tablet (0.5 mg total) by mouth at bedtime as needed. Patient taking differently: Take 0.5 mg by mouth at bedtime as needed (Restless leg).  07/21/18  Yes Hoyt Koch, MD  temazepam (RESTORIL) 15 MG capsule Take 1-2 capsules (15-30 mg total) by mouth at bedtime as needed for sleep. 05/29/18  Yes Hoyt Koch, MD  VIIBRYD 40 MG TABS Take 1 tablet by mouth once daily 09/29/18  Yes Hoyt Koch, MD    Allergies  Allergen Reactions  . Codeine Itching    Physical Exam  Vitals  Blood pressure (!) 168/74, pulse (!) 56, temperature 98.6 F (37 C), temperature source Oral, resp. rate 13, height 5\' 2"  (1.575 m), weight 61.2 kg, SpO2 95 %.   General appearance, chronically ill, no acute distress HEENT no jaundice or pallor there is mild facial deviation Neck supple, no neck vein distention Chest clear and resonant with decreased breath sounds at the bases Heart normal  S1-S2, no murmurs gallops or rubs Abdomen soft, nontender, bowel sounds present Extremities no clubbing cyanosis or edema Neuro right-sided weakness upper and lower extremities/slurring of speech Skin no rashes or ulcers  Data Review  CBC Recent Labs  Lab  10/11/18 1617 10/11/18 1634  WBC 4.8  --   HGB 12.7 14.6  HCT 37.2 43.0  PLT 183  --   MCV 97.1  --   MCH 33.2  --   MCHC 34.1  --   RDW 12.1  --   LYMPHSABS 1.8  --   MONOABS 0.4  --   EOSABS 0.1  --   BASOSABS 0.0  --    ------------------------------------------------------------------------------------------------------------------  Chemistries  Recent Labs  Lab 10/11/18 1617 10/11/18 1634  NA 133* 133*  K 3.8 3.8  CL 95* 95*  CO2 27  --   GLUCOSE 115* 110*  BUN 10 10  CREATININE 0.70 0.60  CALCIUM 9.1  --   AST 27  --   ALT 21  --   ALKPHOS 66  --   BILITOT 0.3  --    ------------------------------------------------------------------------------------------------------------------ estimated creatinine clearance is 61.1 mL/min (by C-G formula based on SCr of 0.6 mg/dL). ------------------------------------------------------------------------------------------------------------------ No results for input(s): TSH, T4TOTAL, T3FREE, THYROIDAB in the last 72 hours.  Invalid input(s): FREET3   Coagulation profile Recent Labs  Lab 10/11/18 1617  INR 1.0   ------------------------------------------------------------------------------------------------------------------- No results for input(s): DDIMER in the last 72 hours. -------------------------------------------------------------------------------------------------------------------  Cardiac Enzymes No results for input(s): CKMB, TROPONINI, MYOGLOBIN in the last 168 hours.  Invalid input(s): CK ------------------------------------------------------------------------------------------------------------------ Invalid input(s):  POCBNP   ---------------------------------------------------------------------------------------------------------------  Urinalysis    Component Value Date/Time   COLORURINE STRAW (A) 10/11/2018 1752   APPEARANCEUR CLEAR 10/11/2018 1752   LABSPEC 1.020 10/11/2018 1752   PHURINE 6.0 10/11/2018 1752   GLUCOSEU NEGATIVE 10/11/2018 1752   HGBUR NEGATIVE 10/11/2018 1752   BILIRUBINUR NEGATIVE 10/11/2018 1752   KETONESUR NEGATIVE 10/11/2018 1752   PROTEINUR NEGATIVE 10/11/2018 1752   UROBILINOGEN 0.2 10/10/2014 1128   NITRITE NEGATIVE 10/11/2018 1752   LEUKOCYTESUR NEGATIVE 10/11/2018 1752    ----------------------------------------------------------------------------------------------------------------   Imaging results:   Ct Code Stroke Cta Head W/wo Contrast  Result Date: 10/11/2018 CLINICAL DATA:  Right-sided weakness. EXAM: CT ANGIOGRAPHY HEAD AND NECK TECHNIQUE: Multidetector CT imaging of the head and neck was performed using the standard protocol during bolus administration of intravenous contrast. Multiplanar CT image reconstructions and MIPs were obtained to evaluate the vascular anatomy. Carotid stenosis measurements (when applicable) are obtained utilizing NASCET criteria, using the distal internal carotid diameter as the denominator. CONTRAST:  118mL OMNIPAQUE IOHEXOL 350 MG/ML SOLN COMPARISON:  CT head 10/11/2018 FINDINGS: CTA NECK FINDINGS Aortic arch: Standard branching. Imaged portion shows no evidence of aneurysm or dissection. No significant stenosis of the major arch vessel origins. Right carotid system: Mild atherosclerotic calcification right common carotid artery and right carotid bifurcation without significant stenosis. Negative for dissection Left carotid system: Mild atherosclerotic calcification left carotid bifurcation without significant stenosis. Negative for dissection Vertebral arteries: Both vertebral arteries are patent to the basilar without stenosis.  Skeleton: No acute abnormality Other neck: Negative Upper chest: Lung apices clear bilaterally. No mediastinal mass or adenopathy. Review of the MIP images confirms the above findings CTA HEAD FINDINGS Anterior circulation: Cavernous carotid widely patent bilaterally without significant stenosis. Anterior and middle cerebral arteries widely patent bilaterally without stenosis or occlusion. Posterior circulation: Both vertebral arteries patent to the basilar. PICA patent bilaterally. Basilar widely patent. Superior cerebellar and posterior cerebral arteries widely patent bilaterally. Posterior communicating artery patent bilaterally. Venous sinuses: Normal enhancement Anatomic variants: None Review of the MIP images confirms the above findings IMPRESSION: 1. Negative for emergent large vessel occlusion 2. No significant carotid or vertebral  artery stenosis in the neck 3. No significant intracranial stenosis. Electronically Signed   By: Franchot Gallo M.D.   On: 10/11/2018 17:16   Ct Code Stroke Cta Neck W/wo Contrast  Result Date: 10/11/2018 CLINICAL DATA:  Right-sided weakness. EXAM: CT ANGIOGRAPHY HEAD AND NECK TECHNIQUE: Multidetector CT imaging of the head and neck was performed using the standard protocol during bolus administration of intravenous contrast. Multiplanar CT image reconstructions and MIPs were obtained to evaluate the vascular anatomy. Carotid stenosis measurements (when applicable) are obtained utilizing NASCET criteria, using the distal internal carotid diameter as the denominator. CONTRAST:  162mL OMNIPAQUE IOHEXOL 350 MG/ML SOLN COMPARISON:  CT head 10/11/2018 FINDINGS: CTA NECK FINDINGS Aortic arch: Standard branching. Imaged portion shows no evidence of aneurysm or dissection. No significant stenosis of the major arch vessel origins. Right carotid system: Mild atherosclerotic calcification right common carotid artery and right carotid bifurcation without significant stenosis. Negative for  dissection Left carotid system: Mild atherosclerotic calcification left carotid bifurcation without significant stenosis. Negative for dissection Vertebral arteries: Both vertebral arteries are patent to the basilar without stenosis. Skeleton: No acute abnormality Other neck: Negative Upper chest: Lung apices clear bilaterally. No mediastinal mass or adenopathy. Review of the MIP images confirms the above findings CTA HEAD FINDINGS Anterior circulation: Cavernous carotid widely patent bilaterally without significant stenosis. Anterior and middle cerebral arteries widely patent bilaterally without stenosis or occlusion. Posterior circulation: Both vertebral arteries patent to the basilar. PICA patent bilaterally. Basilar widely patent. Superior cerebellar and posterior cerebral arteries widely patent bilaterally. Posterior communicating artery patent bilaterally. Venous sinuses: Normal enhancement Anatomic variants: None Review of the MIP images confirms the above findings IMPRESSION: 1. Negative for emergent large vessel occlusion 2. No significant carotid or vertebral artery stenosis in the neck 3. No significant intracranial stenosis. Electronically Signed   By: Franchot Gallo M.D.   On: 10/11/2018 17:16   Ct Head Code Stroke Wo Contrast  Result Date: 10/11/2018 CLINICAL DATA:  Code stroke.  Right-sided weakness. EXAM: CT HEAD WITHOUT CONTRAST TECHNIQUE: Contiguous axial images were obtained from the base of the skull through the vertex without intravenous contrast. COMPARISON:  CT head 10/10/2014.  MRI head 10/18/2017 FINDINGS: Brain: Negative for acute infarct.  Negative for hemorrhage or mass. Chronic ischemic changes in the basal ganglia bilaterally left greater than right similar to prior studies. Chronic ischemia extending into the left corona radiata unchanged. Benign-appearing cyst right basal ganglia unchanged. Negative for hydrocephalus. Vascular: Negative for hyperdense vessel Skull: Negative  Sinuses/Orbits: Negative Other: None ASPECTS (Reserve Stroke Program Early CT Score) - Ganglionic level infarction (caudate, lentiform nuclei, internal capsule, insula, M1-M3 cortex): 7 - Supraganglionic infarction (M4-M6 cortex): 3 Total score (0-10 with 10 being normal): 10 IMPRESSION: 1. No acute abnormality 2. ASPECTS is 10 3. Chronic small vessel ischemia most notably in the left basal ganglia and corona radiata, unchanged from prior studies. 4. These results were called by telephone at the time of interpretation on 10/11/2018 at 4:54 pm to provider [facility] , who verbally acknowledged these results. Electronically Signed   By: Franchot Gallo M.D.   On: 10/11/2018 16:54      Assessment & Plan  CVA Worsening right-sided weakness and slurring of speech Out of the window for TPA  Seizures Continue with Keppra  History of hypertension Continue with permissive hypetension  Hyperlipidemia Continue with Lipitor  DVT Prophylaxis Lovenox  AM Labs Ordered, also please review Full Orders  Family Communication: At bedside.  Code Status full  Disposition Plan: To be determined  Time spent in minutes : 42 minutes  Condition GUARDED   @SIGNATURE @

## 2018-10-12 ENCOUNTER — Encounter (HOSPITAL_COMMUNITY): Payer: Self-pay

## 2018-10-12 DIAGNOSIS — R4701 Aphasia: Secondary | ICD-10-CM | POA: Diagnosis not present

## 2018-10-12 DIAGNOSIS — E785 Hyperlipidemia, unspecified: Secondary | ICD-10-CM

## 2018-10-12 DIAGNOSIS — F331 Major depressive disorder, recurrent, moderate: Secondary | ICD-10-CM

## 2018-10-12 DIAGNOSIS — I69351 Hemiplegia and hemiparesis following cerebral infarction affecting right dominant side: Secondary | ICD-10-CM | POA: Diagnosis not present

## 2018-10-12 DIAGNOSIS — G40909 Epilepsy, unspecified, not intractable, without status epilepticus: Secondary | ICD-10-CM

## 2018-10-12 DIAGNOSIS — G459 Transient cerebral ischemic attack, unspecified: Secondary | ICD-10-CM | POA: Diagnosis not present

## 2018-10-12 DIAGNOSIS — F419 Anxiety disorder, unspecified: Secondary | ICD-10-CM

## 2018-10-12 LAB — LIPID PANEL
Cholesterol: 111 mg/dL (ref 0–200)
HDL: 56 mg/dL (ref >40)
LDL Cholesterol: 39 mg/dL (ref 0–99)
Total CHOL/HDL Ratio: 2 RATIO
Triglycerides: 81 mg/dL (ref <150)
VLDL: 16 mg/dL (ref 0–40)

## 2018-10-12 MED ORDER — ASPIRIN EC 81 MG PO TBEC: 81.0000 mg | DELAYED_RELEASE_TABLET | Freq: Every day | ORAL | Status: DC

## 2018-10-12 MED ORDER — ALBUTEROL SULFATE (2.5 MG/3ML) 0.083% IN NEBU: 2.5000 mg | INHALATION_SOLUTION | Freq: Four times a day (QID) | RESPIRATORY_TRACT | Status: DC | PRN

## 2018-10-12 MED ORDER — ASPIRIN 81 MG PO TBEC: 81.0000 mg | DELAYED_RELEASE_TABLET | Freq: Every day | ORAL | 0 refills | Status: AC

## 2018-10-12 MED ORDER — ATORVASTATIN CALCIUM 80 MG PO TABS: 80.0000 mg | ORAL_TABLET | Freq: Every day | ORAL | 3 refills | Status: DC

## 2018-10-12 MED ORDER — CLOPIDOGREL BISULFATE 75 MG PO TABS: 75.0000 mg | ORAL_TABLET | Freq: Every day | ORAL | 4 refills | Status: DC

## 2018-10-12 MED ORDER — CLOPIDOGREL BISULFATE 75 MG PO TABS
75.0000 mg | ORAL_TABLET | Freq: Every day | ORAL | Status: DC
  Administered 2018-10-12: 75 mg via ORAL
  Filled 2018-10-12: qty 1

## 2018-10-12 NOTE — Procedures (Addendum)
Patient Name: SHAILEEN MENGES  MRN: [id number]  Epilepsy Attending: Lora Havens  Referring Physician/Provider:  Dr Merton Border Date: 10/11/2018 Duration: 23.40 mins  Patient history: 63 year old female with left basal ganglia infarct and seizure disorder who presented with dysarthria.  EEG to evaluate for seizures.  Level of alertness: awake, drowsy  AEDs during EEG study: LEV, gabapentin  Technical aspects: This EEG study was done with scalp electrodes positioned according to the 10-20 International system of electrode placement. Electrical activity was acquired at a sampling rate of 500Hz  and reviewed with a high frequency filter of 70Hz  and a low frequency filter of 1Hz . EEG data were recorded continuously and digitally stored.   DESCRIPTION:  The posterior dominant rhythm consists of 8-9 Hz activity of moderate voltage (25-35 uV) seen predominantly in posterior head regions, symmetric and reactive to eye opening and eye closing.  Drowsiness was characterized by attenuation of the posterior background rhythm and roving eye movements. Hyperventilation and photic stimulation were not performed.  IMPRESSION: This study is within normal limits. No seizures or epileptiform discharges were seen throughout the recording.  Keimya Briddell Barbra Sarks

## 2018-10-12 NOTE — Progress Notes (Signed)
Discharged after IV access removed and received letter for return to work.

## 2018-10-12 NOTE — Progress Notes (Signed)
Physical Therapy Evaluation and Discharge  Clinical Impression/Assessment: Patient evaluated by Physical Therapy with no further acute PT needs identified. All education has been completed and the patient has no further questions. Daughter present during session and agrees patient is at her baseline for mobility, patient is just more sleepy than usual. Daughter reports this sleepiness happened after her stroke and did improve with time. PT is signing off. Thank you for this referral.   10/12/18 1204  PT Visit Information  Last PT Received On 10/12/18  Assistance Needed +1  History of Present Illness 63 year old female with prior history of CVA in 2013 status post TPA, seizure disorder on Keppra, restless leg syndrome, hypertension, depression presented 10/11/18 with 1 day history of slurring of speech, worsening right-sided weakness. Patient had a prior stroke in 2013, when MRI had shown nonhemorrhagic infarct involving the posterior left lentiform nucleus, has residual hemiparesis. Patient was seen by neurology for code stroke in ED, CT angiogram of the head and neck showed no acute abnormalities. MRI of the brain showed no acute intracranial abnormality, old left basal ganglia small vessel infarct, chronic microvascular ischemia  Precautions  Precautions Fall  Restrictions  Weight Bearing Restrictions No  Home Living  Family/patient expects to be discharged to: Private residence  Living Arrangements Alone  Available Help at Discharge Family;Available 24 hours/day  Type of Home Apartment  Home Access Stairs to enter  Entrance Stairs-Number of Steps 10  Entrance Stairs-Rails Right;Left  Home Layout One level  Bathroom Shower/Tub Tub/shower unit;Curtain  Corporate treasurer Yes  [place] - single point;Walker - 4 wheels   Lives With Alone  Prior Function  Level of Independence Independent with assistive device(s)  Comments uses rollator if goes out  in community; no device indoors; reports off-balance at times, no falls  Communication  Communication No difficulties  Pain Assessment  Pain Assessment 0-10  Pain Score 3  Pain Location head  Pain Descriptors / Indicators Headache  Pain Intervention(s) Limited activity within patient's tolerance;Monitored during session  Cognition  Arousal/Alertness Awake/alert  Behavior During Therapy Flat affect  Overall Cognitive Status Within Functional Limits for tasks assessed  General Comments a&ox4  Upper Extremity Assessment  Upper Extremity Assessment Defer to OT evaluation  Lower Extremity Assessment  Lower Extremity Assessment Overall WFL for tasks assessed  Cervical / Trunk Assessment  Cervical / Trunk Assessment Kyphotic  Bed Mobility  Overal bed mobility Modified Independent  General bed mobility comments incr time due to sleepy from being awakened; incr time manipulating linens   Transfers  Overall transfer level Independent  Equipment used None  General transfer comment bed, chair, toilet  Ambulation/Gait  Ambulation/Gait assistance Independent  Gait Distance (Feet) 200 Feet  Assistive device None  Gait Pattern/deviations Step-through pattern;Wide base of support;Decreased stride length  General Gait Details daughter present and reports gait is her baseline  Gait velocity decreased self-selected pace; able to adjust up/down on command with no imbalance  Stairs Yes  Stairs assistance Supervision  Stair Management Two rails;Alternating pattern;Step to pattern;Forwards  Number of Stairs 10  General stair comments daughter reports she rarely carries anything up/down steps (daughter does grocery shopping/delivers cooked meals)  Modified Rankin (Stroke Patients Only)  Pre-Morbid Rankin Score 1  Modified Rankin 2  Standardized Balance Assessment  Standardized Balance Assessment  Berg Balance Test  Berg Balance Test  Sit to Stand 3  Standing Unsupported 4  Sitting with Back  Unsupported but Feet Supported on Floor or Stool  4  Stand to Sit 4  Transfers 4  Standing Unsupported with Eyes Closed 4  Standing Ubsupported with Feet Together 4  From Standing, Reach Forward with Outstretched Arm 4  From Standing Position, Pick up Object from Floor 4  From Standing Position, Turn to Look Behind Over each Shoulder 4  Turn 360 Degrees 2  Standing Unsupported, Alternately Place Feet on Step/Stool 2  Standing Unsupported, One Foot in Front 3  Standing on One Leg 1  Total Score 47  General Comments  General comments (skin integrity, edema, etc.) daughter plans to have pt come stay with her for 1st night; discussed possible need for supervision based on degree of sleepiness   PT - End of Session  Equipment Utilized During Treatment Gait belt  Activity Tolerance Patient tolerated treatment well  Patient left in chair;Other (comment);with family/visitor present (OT arrived for eval)  Nurse Communication Mobility status  PT Assessment  PT Recommendation/Assessment Patent does not need any further PT services  PT Visit Diagnosis Other symptoms and signs involving the nervous system (R29.898)  No Skilled PT Patient at baseline level of functioning  AM-PAC PT "6 Clicks" Mobility Outcome Measure (Version 2)  Help needed turning from your back to your side while in a flat bed without using bedrails? 4  Help needed moving from lying on your back to sitting on the side of a flat bed without using bedrails? 4  Help needed moving to and from a bed to a chair (including a wheelchair)? 4  Help needed standing up from a chair using your arms (e.g., wheelchair or bedside chair)? 4  Help needed to walk in hospital room? 4  Help needed climbing 3-5 steps with a railing?  4  6 Click Score 24  Consider Recommendation of Discharge To: Home with no services  PT Recommendation  Follow Up Recommendations No PT follow up  PT equipment None recommended by PT  Acute Rehab PT Goals  Patient  Stated Goal go home and stay by herself  PT Goal Formulation All assessment and education complete, DC therapy  PT Time Calculation  PT Start Time (ACUTE ONLY) 1202  PT Stop Time (ACUTE ONLY) 1232  PT Time Calculation (min) (ACUTE ONLY) 30 min  PT General Charges  $$ ACUTE PT VISIT 1 Visit  PT Evaluation  $PT Eval Low Complexity 1 Low  PT Treatments  $Therapeutic Activity 8-22 mins  Written Expression  Dominant Hand Right      Barry Brunner, PT

## 2018-10-12 NOTE — Discharge Summary (Addendum)
Physician Discharge Summary   Patient ID: Anna Lyons MRN: [id number] DOB/AGE: 1956/01/27 63 y.o.  Admit date: 10/11/2018 Discharge date: 10/12/2018  Primary Care Physician:  Hoyt Koch, MD   Recommendations for Outpatient Follow-up:  1. Follow up with PCP in 1-2 weeks  Home Health: None  Equipment/Devices:   Discharge Condition: stable  CODE STATUS: FULL   Diet recommendation:    Discharge Diagnoses:   TIA Prior history of CVA with residual right-sided weakness Hypertension Hyperlipidemia Seizure disorder History of restless leg syndrome,  Peripheral neuropathy History of depression   Consults: Neurology    Allergies:   Allergies  Allergen Reactions  . Codeine Itching     DISCHARGE MEDICATIONS: Allergies as of 10/12/2018      Reactions   Codeine Itching      Medication List    TAKE these medications   albuterol 108 (90 Base) MCG/ACT inhaler Commonly known as: VENTOLIN HFA Inhale 2 puffs into the lungs every 6 (six) hours as needed for wheezing or shortness of breath.   aspirin 81 MG EC tablet Take 1 tablet (81 mg total) by mouth daily for 21 days. Take with plavix for 3 weeks, then plavix alone. Start taking on: October 13, 2018 What changed:   medication strength  how much to take  when to take this  additional instructions   atorvastatin 80 MG tablet Commonly known as: LIPITOR Take 1 tablet (80 mg total) by mouth at bedtime. What changed:   when to take this  additional instructions   baclofen 20 MG tablet Commonly known as: LIORESAL TAKE 1/2 (ONE-HALF) TABLET BY MOUTH THREE TIMES DAILY FOR 7 DAYS AND THEN  1 TABLET THREE TIMES DAILY What changed:   how much to take  how to take this  when to take this  additional instructions   clopidogrel 75 MG tablet Commonly known as: PLAVIX Take 1 tablet (75 mg total) by mouth daily. Start taking on: October 13, 2018   gabapentin 100 MG capsule Commonly known as:  NEURONTIN Take 1 capsule (100 mg total) by mouth 3 (three) times daily.   hydrochlorothiazide 12.5 MG capsule Commonly known as: Microzide Take 1 capsule (12.5 mg total) by mouth daily.   ibuprofen 800 MG tablet Commonly known as: ADVIL Take 1 tablet by mouth every 8 (eight) hours as needed for moderate pain.   levETIRAcetam 500 MG tablet Commonly known as: KEPPRA Take 1 tablet (500 mg total) by mouth 2 (two) times daily.   multivitamin with minerals Tabs tablet Take 1 tablet by mouth daily.   pramipexole 0.5 MG tablet Commonly known as: MIRAPEX Take 1 tablet (0.5 mg total) by mouth at bedtime as needed. What changed: reasons to take this   temazepam 15 MG capsule Commonly known as: RESTORIL Take 1-2 capsules (15-30 mg total) by mouth at bedtime as needed for sleep.   Viibryd 40 MG Tabs Generic drug: Vilazodone HCl Take 1 tablet by mouth once daily        Brief H and P: For complete details please refer to admission H and P, but in brief Patient is a 63 year old female with prior history of CVA in 2013 status post TPA, seizure disorder on Keppra, restless leg syndrome, hypertension, depression presented with 1 day history of slurring of speech, worsening right-sided weakness.  Patient had a prior stroke in 2013, when MRI had shown nonhemorrhagic infarct involving the posterior left lentiform nucleus, has residual hemiparesis. Patient was seen by neurology for code stroke  in ED, CT angiogram of the head and neck showed no acute abnormalities.  Patient was recommended MRI and stroke work-up, admitted for further work-up.   Hospital Course:    TIA (transient ischemic attack), neurological deficits including right-sided weakness, dysarthria, history of prior CVA -CT angiogram of the head and neck showed no emergent large vessel occlusion, no significant carotid or vertebral artery stenosis in the neck, no significant intracranial stenosis -MRI of the brain showed no acute  intracranial abnormality, old left basal ganglia small vessel infarct, chronic microvascular ischemia -2D echo 07/2018 had shown normal EF 60 to 65% -Patient was seen by neurology, initial concern for LVO, was recommended full stroke work-up.  Concern for new stroke versus stroke recrudescence, seizure -MRI brain is negative for acute CVA -Neurology was consulted, seen by stroke service, Dr Erlinda Hong recommended aspirin and Plavix for 3 weeks then Plavix alone indefinitely.  Continue Lipitor. -EEG normal -PT OT evaluation was done -Patient has a follow-up appointment with her neurologist, Dr. Leonie Man on 9/22.  - Patient had significant improvement since admission, stable for discharge   Hyperlipidemia Continue Lipitor  Hypertension -BP currently stable, permissive hypertension, on HCTZ    Major depression, recurrent (HCC) -Continue Restoril  History of restless leg syndrome, neuropathy -Continue Neurontin, pramipexole    Seizure disorder ([place]) -Received loading dose of Keppra IV in ED, continue Keppra   Day of Discharge S: Feeling tired otherwise no acute complaints, no seizures overnight.  No headache or new neurological deficits.  BP (!) 145/62 (BP Location: Right Arm)   Pulse (!) 58   Temp 98.1 F (36.7 C) (Oral)   Resp 16   Ht 5\' 2"  (1.575 m)   Wt 62.3 kg   SpO2 94%   BMI 25.12 kg/m   Physical Exam: General: Alert and awake oriented x3 not in any acute distress.  Dysarthria improving HEENT: anicteric sclera, pupils reactive to light and accommodation CVS: S1-S2 clear no murmur rubs or gallops Chest: clear to auscultation bilaterally, no wheezing rales or rhonchi Abdomen: soft nontender, nondistended, normal bowel sounds Extremities: no cyanosis, clubbing or edema noted bilaterally Neuro: Speech improving, strength 4/5 RUE, RLE, chronic.  Strength 5/5 in the left UE, L LE   The results of significant diagnostics from this hospitalization (including imaging,  microbiology, ancillary and laboratory) are listed below for reference.      Procedures/Studies:  Ct Code Stroke Cta Head W/wo Contrast  Result Date: 10/11/2018 CLINICAL DATA:  Right-sided weakness. EXAM: CT ANGIOGRAPHY HEAD AND NECK TECHNIQUE: Multidetector CT imaging of the head and neck was performed using the standard protocol during bolus administration of intravenous contrast. Multiplanar CT image reconstructions and MIPs were obtained to evaluate the vascular anatomy. Carotid stenosis measurements (when applicable) are obtained utilizing NASCET criteria, using the distal internal carotid diameter as the denominator. CONTRAST:  158mL OMNIPAQUE IOHEXOL 350 MG/ML SOLN COMPARISON:  CT head 10/11/2018 FINDINGS: CTA NECK FINDINGS Aortic arch: Standard branching. Imaged portion shows no evidence of aneurysm or dissection. No significant stenosis of the major arch vessel origins. Right carotid system: Mild atherosclerotic calcification right common carotid artery and right carotid bifurcation without significant stenosis. Negative for dissection Left carotid system: Mild atherosclerotic calcification left carotid bifurcation without significant stenosis. Negative for dissection Vertebral arteries: Both vertebral arteries are patent to the basilar without stenosis. Skeleton: No acute abnormality Other neck: Negative Upper chest: Lung apices clear bilaterally. No mediastinal mass or adenopathy. Review of the MIP images confirms the above findings CTA HEAD FINDINGS  Anterior circulation: Cavernous carotid widely patent bilaterally without significant stenosis. Anterior and middle cerebral arteries widely patent bilaterally without stenosis or occlusion. Posterior circulation: Both vertebral arteries patent to the basilar. PICA patent bilaterally. Basilar widely patent. Superior cerebellar and posterior cerebral arteries widely patent bilaterally. Posterior communicating artery patent bilaterally. Venous sinuses:  Normal enhancement Anatomic variants: None Review of the MIP images confirms the above findings IMPRESSION: 1. Negative for emergent large vessel occlusion 2. No significant carotid or vertebral artery stenosis in the neck 3. No significant intracranial stenosis. Electronically Signed   By: Franchot Gallo M.D.   On: 10/11/2018 17:16   Ct Code Stroke Cta Neck W/wo Contrast  Result Date: 10/11/2018 CLINICAL DATA:  Right-sided weakness. EXAM: CT ANGIOGRAPHY HEAD AND NECK TECHNIQUE: Multidetector CT imaging of the head and neck was performed using the standard protocol during bolus administration of intravenous contrast. Multiplanar CT image reconstructions and MIPs were obtained to evaluate the vascular anatomy. Carotid stenosis measurements (when applicable) are obtained utilizing NASCET criteria, using the distal internal carotid diameter as the denominator. CONTRAST:  170mL OMNIPAQUE IOHEXOL 350 MG/ML SOLN COMPARISON:  CT head 10/11/2018 FINDINGS: CTA NECK FINDINGS Aortic arch: Standard branching. Imaged portion shows no evidence of aneurysm or dissection. No significant stenosis of the major arch vessel origins. Right carotid system: Mild atherosclerotic calcification right common carotid artery and right carotid bifurcation without significant stenosis. Negative for dissection Left carotid system: Mild atherosclerotic calcification left carotid bifurcation without significant stenosis. Negative for dissection Vertebral arteries: Both vertebral arteries are patent to the basilar without stenosis. Skeleton: No acute abnormality Other neck: Negative Upper chest: Lung apices clear bilaterally. No mediastinal mass or adenopathy. Review of the MIP images confirms the above findings CTA HEAD FINDINGS Anterior circulation: Cavernous carotid widely patent bilaterally without significant stenosis. Anterior and middle cerebral arteries widely patent bilaterally without stenosis or occlusion. Posterior circulation: Both  vertebral arteries patent to the basilar. PICA patent bilaterally. Basilar widely patent. Superior cerebellar and posterior cerebral arteries widely patent bilaterally. Posterior communicating artery patent bilaterally. Venous sinuses: Normal enhancement Anatomic variants: None Review of the MIP images confirms the above findings IMPRESSION: 1. Negative for emergent large vessel occlusion 2. No significant carotid or vertebral artery stenosis in the neck 3. No significant intracranial stenosis. Electronically Signed   By: Franchot Gallo M.D.   On: 10/11/2018 17:16   Mr Brain Wo Contrast  Result Date: 10/11/2018 CLINICAL DATA:  Weakness, right-sided. EXAM: MRI HEAD WITHOUT CONTRAST TECHNIQUE: Multiplanar, multiecho pulse sequences of the brain and surrounding structures were obtained without intravenous contrast. COMPARISON:  Head CT 10/11/2018 FINDINGS: BRAIN: There is no acute infarct, acute hemorrhage or extra-axial collection. Multifocal white matter hyperintensity, most commonly due to chronic ischemic microangiopathy. There is an old left basal ganglia small vessel infarct. The cerebral and cerebellar volume are age-appropriate. There is no hydrocephalus. The midline structures are normal. VASCULAR: The major intracranial arterial and venous sinus flow voids are normal. Susceptibility-sensitive sequences show no chronic microhemorrhage or superficial siderosis. SKULL AND UPPER CERVICAL SPINE: Calvarial bone marrow signal is normal. There is no skull base mass. The visualized upper cervical spine and soft tissues are normal. SINUSES/ORBITS: There are no fluid levels or advanced mucosal thickening. The mastoid air cells and middle ear cavities are free of fluid. The orbits are normal. IMPRESSION: 1. No acute intracranial abnormality. 2. 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 Electronically Signed   By: Cletus Gash.D.  On: 10/11/2018 19:54   Ct Head  Code Stroke Wo Contrast  Result Date: 10/11/2018 CLINICAL DATA:  Code stroke.  Right-sided weakness. EXAM: CT HEAD WITHOUT CONTRAST TECHNIQUE: Contiguous axial images were obtained from the base of the skull through the vertex without intravenous contrast. COMPARISON:  CT head 10/10/2014.  MRI head 10/18/2017 FINDINGS: Brain: Negative for acute infarct.  Negative for hemorrhage or mass. Chronic ischemic changes in the basal ganglia bilaterally left greater than right similar to prior studies. Chronic ischemia extending into the left corona radiata unchanged. Benign-appearing cyst right basal ganglia unchanged. Negative for hydrocephalus. Vascular: Negative for hyperdense vessel Skull: Negative Sinuses/Orbits: Negative Other: None ASPECTS ([place] Stroke Program Early CT Score) - Ganglionic level infarction (caudate, lentiform nuclei, internal capsule, insula, M1-M3 cortex): 7 - Supraganglionic infarction (M4-M6 cortex): 3 Total score (0-10 with 10 being normal): 10 IMPRESSION: 1. No acute abnormality 2. ASPECTS is 10 3. Chronic small vessel ischemia most notably in the left basal ganglia and corona radiata, unchanged from prior studies. 4. These results were called by telephone at the time of interpretation on 10/11/2018 at 4:54 pm to provider [facility] , who verbally acknowledged these results. Electronically Signed   By: Franchot Gallo M.D.   On: 10/11/2018 16:54      LAB RESULTS: Basic Metabolic Panel: Recent Labs  Lab 10/11/18 1617 10/11/18 1634  NA 133* 133*  K 3.8 3.8  CL 95* 95*  CO2 27  --   GLUCOSE 115* 110*  BUN 10 10  CREATININE 0.70 0.60  CALCIUM 9.1  --    Liver Function Tests: Recent Labs  Lab 10/11/18 1617  AST 27  ALT 21  ALKPHOS 66  BILITOT 0.3  PROT 6.7  ALBUMIN 3.6   No results for input(s): LIPASE, AMYLASE in the last 168 hours. No results for input(s): AMMONIA in the last 168 hours. CBC: Recent Labs  Lab 10/11/18 1617 10/11/18 1634  WBC 4.8  --    NEUTROABS 2.6  --   HGB 12.7 14.6  HCT 37.2 43.0  MCV 97.1  --   PLT 183  --    Cardiac Enzymes: No results for input(s): CKTOTAL, CKMB, CKMBINDEX, TROPONINI in the last 168 hours. BNP: Invalid input(s): POCBNP CBG: Recent Labs  Lab 10/11/18 1600  GLUCAP 119*      Disposition and Follow-up: Discharge Instructions    Diet - low sodium heart healthy   Complete by: As directed    Increase activity slowly   Complete by: As directed        DISPOSITION: home    DISCHARGE FOLLOW-UP Follow-up Information    Garvin Fila, MD Follow up on 10/24/2018.   Specialties: Neurology, Radiology Why: at 9:30 AM Contact information: [address] [place] [address] [phone]            Time coordinating discharge:  35 mins   Signed:   Estill Cotta M.D. [facility] 10/12/2018, 12:53 PM

## 2018-10-12 NOTE — Evaluation (Signed)
Occupational Therapy Evaluation Patient Details Name: Anna Lyons MRN: [id number] DOB: 1955-12-09 Today's Date: 10/12/2018    History of Present Illness 63 year old female with prior history of CVA in 2013 status post TPA, seizure disorder on Keppra, restless leg syndrome, hypertension, depression presented 10/11/18 with 1 day history of slurring of speech, worsening right-sided weakness. Patient had a prior stroke in 2013, when MRI had shown nonhemorrhagic infarct involving the posterior left lentiform nucleus, has residual hemiparesis. Patient was seen by neurology for code stroke in ED, CT angiogram of the head and neck showed no acute abnormalities. MRI of the brain showed no acute intracranial abnormality, old left basal ganglia small vessel infarct, chronic microvascular ischemia   Clinical Impression   Pt PTA: living at home alone with family very supportive and available as needed. PT admitted with above dx. Pt currently with functional limitiations due to the deficits in lethargy throughout session, but pt perked up after PT left. OT recommends supervisionA for today and tonight- planning to stay with daughter. Pt performing grooming at sink with modified independence and transferring on/off commode with no assist. Pt performing ADL functional mobility with no AD and fair balance.  Pt does not require continued OT skilled services as pt is very close to baseline functioning. OT signing off.     Follow Up Recommendations  No OT follow up;Supervision - Intermittent    Equipment Recommendations  None recommended by OT    Recommendations for Other Services       Precautions / Restrictions Precautions Precautions: Fall Restrictions Weight Bearing Restrictions: No      Mobility Bed Mobility Overal bed mobility: Modified Independent             General bed mobility comments: in recliner upon arrival  Transfers Overall transfer level: Independent Equipment used: None              General transfer comment: bed, chair, toilet    Balance Overall balance assessment: No apparent balance deficits (not formally assessed)                               Standardized Balance Assessment Standardized Balance Assessment : Berg Balance Test Berg Balance Test Sit to Stand: Able to stand  independently using hands Standing Unsupported: Able to stand safely 2 minutes Sitting with Back Unsupported but Feet Supported on Floor or Stool: Able to sit safely and securely 2 minutes Stand to Sit: Sits safely with minimal use of hands Transfers: Able to transfer safely, minor use of hands Standing Unsupported with Eyes Closed: Able to stand 10 seconds safely Standing Ubsupported with Feet Together: Able to place feet together independently and stand 1 minute safely From Standing, Reach Forward with Outstretched Arm: Can reach confidently >25 cm (10") From Standing Position, Pick up Object from Floor: Able to pick up shoe safely and easily From Standing Position, Turn to Look Behind Over each Shoulder: Looks behind from both sides and weight shifts well Turn 360 Degrees: Able to turn 360 degrees safely but slowly Standing Unsupported, Alternately Place Feet on Step/Stool: Able to complete 4 steps without aid or supervision Standing Unsupported, One Foot in Front: Able to plae foot ahead of the other independently and hold 30 seconds Standing on One Leg: Tries to lift leg/unable to hold 3 seconds but remains standing independently Total Score: 47       ADL either performed or assessed with clinical judgement  ADL Overall ADL's : At baseline                                       General ADL Comments: Pt recommends supervisionA for today and tonight- planning to stay with daughter. Pt performing grooming at sink with modified independence and transferring on/off commode with no assist. Pt performing ADL functional mobility with no AD and fair  balance.      Vision Baseline Vision/History: Wears glasses Wears Glasses: Reading only Patient Visual Report: No change from baseline Vision Assessment?: No apparent visual deficits;Yes Eye Alignment: Within Functional Limits Ocular Range of Motion: Within Functional Limits Alignment/Gaze Preference: Within Defined Limits Tracking/Visual Pursuits: Able to track stimulus in all quads without difficulty Saccades: Within functional limits Convergence: Within functional limits     Perception     Praxis      Pertinent Vitals/Pain Pain Assessment: No/denies pain Pain Score: 3  Pain Location: head Pain Descriptors / Indicators: Headache Pain Intervention(s): Limited activity within patient's tolerance;Monitored during session     Hand Dominance Right   Extremity/Trunk Assessment Upper Extremity Assessment Upper Extremity Assessment: Generalized weakness   Lower Extremity Assessment Lower Extremity Assessment: Overall WFL for tasks assessed   Cervical / Trunk Assessment Cervical / Trunk Assessment: Kyphotic   Communication Communication Communication: No difficulties   Cognition Arousal/Alertness: Awake/alert Behavior During Therapy: Flat affect Overall Cognitive Status: Within Functional Limits for tasks assessed                                 General Comments: a&ox4   General Comments  daughter plans to have pt come stay with her for 1st night; discussed possible need for supervision based on degree of sleepiness     Exercises     Shoulder Instructions      Home Living Family/patient expects to be discharged to:: Private residence Living Arrangements: Alone Available Help at Discharge: Family;Available 24 hours/day Type of Home: Apartment Home Access: Stairs to enter Entrance Stairs-Number of Steps: 10 Entrance Stairs-Rails: Right;Left Home Layout: One level     Bathroom Shower/Tub: Tub/shower unit;Curtain   Bathroom Toilet:  Standard Bathroom Accessibility: Yes   Home Equipment: Cane - single point;Walker - 4 wheels      Lives With: Alone    Prior Functioning/Environment Level of Independence: Independent with assistive device(s)        Comments: uses rollator if goes out in community; no device indoors; reports off-balance at times, no falls        OT Problem List: Decreased activity tolerance      OT Treatment/Interventions:      OT Goals(Current goals can be found in the care plan section) Acute Rehab OT Goals Patient Stated Goal: go home and stay by herself OT Goal Formulation: With patient  OT Frequency:     Barriers to D/C:            Co-evaluation              AM-PAC OT "6 Clicks" Daily Activity     Outcome Measure Help from another person eating meals?: None Help from another person taking care of personal grooming?: A Little Help from another person toileting, which includes using toliet, bedpan, or urinal?: A Little Help from another person bathing (including washing, rinsing, drying)?: A Little Help from another person to  put on and taking off regular upper body clothing?: None Help from another person to put on and taking off regular lower body clothing?: A Little 6 Click Score: 20   End of Session Equipment Utilized During Treatment: Gait belt Nurse Communication: Mobility status  Activity Tolerance: Patient limited by lethargy;Patient tolerated treatment well Patient left: in chair;with call bell/phone within reach;with chair alarm set  OT Visit Diagnosis: Muscle weakness (generalized) (M62.81)                Time: [id number] OT Time Calculation (min): 14 min Charges:  OT General Charges $OT Visit: 1 Visit OT Evaluation $OT Eval Moderate Complexity: 1 Mod  Darryl Nestle) Marsa Aris OTR/L Acute Rehabilitation Services Pager: [phone] Office: [phone]   Jenene Slicker [place] Asberry 10/12/2018, 2:09 PM

## 2018-10-12 NOTE — Care Management Obs Status (Signed)
[place] NOTIFICATION   Patient Details  Name: Anna Lyons MRN: [id number] Date of Birth: 1955/07/06   Medicare Observation Status Notification Given:       Pollie Friar, RN 10/12/2018, 1:51 PM

## 2018-10-12 NOTE — Care Management CC44 (Signed)
Condition Code 44 Documentation Completed  Patient Details  Name: Anna Lyons MRN: [id number] Date of Birth: 1955/06/14   Condition Code 44 given:  Yes Patient signature on Condition Code 44 notice:  Yes Documentation of 2 MD's agreement:  Yes Code 44 added to claim:  Yes    Pollie Friar, RN 10/12/2018, 1:51 PM

## 2018-10-12 NOTE — Evaluation (Signed)
\Speech Language Pathology Evaluation Patient Details Name: Anna Lyons MRN: [id number] DOB: 09/27/1955 Today's Date: 10/12/2018 Time: [id number] SLP Time Calculation (min) (ACUTE ONLY): 34 min  Problem List:  Patient Active Problem List   Diagnosis Date Noted  . CVA (cerebral vascular accident) ([place]) 10/11/2018  . Numbness of right foot 09/27/2018  . Atypical chest pain 07/19/2018  . Acute respiratory distress 07/19/2018  . Epigastric abdominal tenderness without rebound tenderness 02/13/2018  . PTSD (post-traumatic stress disorder) 04/01/2017  . History of stroke 02/17/2017  . Malaise and fatigue 11/02/2016  . Eye problem 05/04/2016  . Cough 03/04/2016  . Abdominal pain, epigastric 10/07/2015  . Paresthesia of both feet 03/09/2015  . Routine general medical examination at a health care facility 11/15/2014  . Breast pain in female 12/21/2013  . Dizziness 08/16/2013  . TIA (transient ischemic attack) 08/16/2013  . Adhesive capsulitis of right shoulder 06/18/2013  . Hemiparesis affecting right side as late effect of cerebrovascular accident ([place]) 06/18/2013  . Seizure disorder ([place]) 05/19/2012  . NSTEMI, initial episode of care; Type 2 05/19/2012  . Anemia 11/17/2011  . Hypokalemia 11/16/2011  . RLS (restless legs syndrome) 11/16/2011  . Abnormal ECG 05/19/2011  . Essential hypertension, benign 05/19/2011  . Tobacco abuse 05/19/2011  . CVA (cerebral infarction) 04/23/2011  . Major depression, recurrent ([place])    Past Medical History:  Past Medical History:  Diagnosis Date  . Angina   . Breast cyst   . Cancer ([place])   . Depression   . Hypertension   . RLS (restless legs syndrome) 11/16/2011  . Seizures ([place]) 2016   last seizure="last year, 2016" per pt.  . Stroke ([place]) 04/20/2011   a. 04/20/11 Left subcortical infarct treated w/ TPA;  b. 04/2011 Echo: EF 55-60%;  c. 04/2011 Normal Carotid u/s  d. Residual "walk w/limp on right; unable to grasp w/right hand"  . TIA (transient  ischemic attack) 08/2010  . Tobacco abuse    a. quit @ time of CVA 04/2011.   Past Surgical History:  Past Surgical History:  Procedure Laterality Date  . BREAST RECONSTRUCTION     bilaterally  . CARPAL TUNNEL RELEASE Left 1990's  . CARPAL TUNNEL RELEASE Left 12/08/2015  . CESAREAN SECTION  1979  . KNEE ARTHROSCOPY Left 1990's    cartilage repair  . LEFT HEART CATHETERIZATION WITH CORONARY ANGIOGRAM N/A 05/20/2011   Procedure: LEFT HEART CATHETERIZATION WITH CORONARY ANGIOGRAM;  Surgeon: Josue Hector, MD;  Location: [facility] CATH LAB;  Service: Cardiovascular;  Laterality: N/A;  . MASTECTOMY Bilateral 1980's   bilateral with reconstruction   HPI:  Pt is a 63 y.o. female, with past medical history significant for CVA in 2013 status post TPA, restless leg syndrome, hypertension and depression who presented with 1-day history of slurring of speech and worsening right-sided weakness. TPA was not administered. MRI of the brain was negative.    Assessment / Plan / Recommendation Clinical Impression  Pt reported that she was living independently prior to admission and was employed part-time cleaning buildings. Per the pt, she had baseline difficulty with memory since a CVA seven years prior but has been using external memory aids to compensate for this. She indicated that she her speech was slurred yesterday and the pt's daughter described it as "garbled" but both parties agreed that it is currently back to baseline.   Her language skills are currently within normal limits and her cognitive-linguistic skills were within functional limits. Mild articulatory imprecision was noted; however, pt  indicated that she has been intentionally using light articulatory contact to reduce pain since her getting new dentures has resulted in her mouth being quite sore. Further skilled SLP services are not clinically indicated at this time. Pt, nursing, and her daughter were educated regarding results and recommendations;  all parties verbalized understanding as well as agreement with plan of care.    SLP Assessment  SLP Recommendation/Assessment: Patient does not need any further Speech Lanaguage Pathology Services SLP Visit Diagnosis: Dysarthria and anarthria (R47.1)    Follow Up Recommendations  None    Frequency and Duration           SLP Evaluation Cognition  Overall Cognitive Status: Within Functional Limits for tasks assessed Arousal/Alertness: Awake/alert Orientation Level: Oriented X4 Attention: Focused;Sustained Focused Attention: Appears intact Sustained Attention: Appears intact Memory: Impaired Memory Impairment: Retrieval deficit(Immediate: 3/3; delayed: 2/3 with cues 1/3) Awareness: Appears intact Problem Solving: Appears intact(4/4)       Comprehension  Auditory Comprehension Overall Auditory Comprehension: Appears within functional limits for tasks assessed Yes/No Questions: Within Functional Limits Basic Biographical Questions: (5/5) Complex Questions: (4/5) Paragraph Comprehension (via yes/no questions): (3/4) Commands: Within Functional Limits Two Step Basic Commands: (3/3) Multistep Basic Commands: (3/3) Conversation: Complex Visual Recognition/Discrimination Discrimination: Within Function Limits Reading Comprehension Reading Status: Within funtional limits    Expression Expression Primary Mode of Expression: Verbal Verbal Expression Overall Verbal Expression: Appears within functional limits for tasks assessed Initiation: No impairment Automatic Speech: Counting;Day of week;Month of year(WNL) Repetition: No impairment(5/5) Naming: No impairment Pragmatics: No impairment   Oral / Motor  Oral Motor/Sensory Function Overall Oral Motor/Sensory Function: Within functional limits Motor Speech Overall Motor Speech: Impaired at baseline Respiration: Within functional limits Phonation: Low vocal intensity Resonance: Within functional limits Articulation:  Impaired Level of Impairment: Conversation Intelligibility: Intelligibility reduced Word: 75-100% accurate Phrase: 75-100% accurate Sentence: 75-100% accurate Conversation: 75-100% accurate Motor Planning: Witnin functional limits Motor Speech Errors: Not applicable   Kristalyn Bergstresser I. Hardin Negus, [place], [place] Office number [phone] Pager [place] 10/12/2018, 11:00 AM

## 2018-10-12 NOTE — Progress Notes (Addendum)
[facility]                                                                              Patient Demographics  Anna Lyons, is a 63 y.o. female, DOB - [birth year], [id number]  Admit date - 10/11/2018   Admitting Physician Merton Border, MD  Outpatient Primary MD for the patient is Hoyt Koch, MD  Outpatient specialists:   LOS - 1  days   Medical records reviewed and are as summarized below:    Chief Complaint  Patient presents with   Code Stroke       Brief summary   Patient is a 63 year old female with prior history of CVA in 2013 status post TPA, seizure disorder on Keppra, restless leg syndrome, hypertension, depression presented with 1 day history of slurring of speech, worsening right-sided weakness.  Patient had a prior stroke in 2013, when MRI had shown nonhemorrhagic infarct involving the posterior left lentiform nucleus, has residual hemiparesis. Patient was seen by neurology for code stroke in ED, CT angiogram of the head and neck showed no acute abnormalities.  Patient was recommended MRI and stroke work-up, admitted for further work-up.   Assessment & Plan    Principal Problem:   TIA (transient ischemic attack), neurological deficits including right-sided weakness, dysarthria, history of prior CVA -CT angiogram of the head and neck showed no emergent large vessel occlusion, no significant carotid or vertebral artery stenosis in the neck, no significant intracranial stenosis -MRI of the brain showed no acute intracranial abnormality, old left basal ganglia small vessel infarct, chronic microvascular ischemia -2D echo 07/2018 had shown normal EF 60 to 65% -Patient was seen by neurology, initial concern for LVO, was recommended full stroke work-up.  Concern for new stroke versus stroke recrudescence, seizure -MRI brain is negative for acute CVA, continue aspirin, Lipitor, will await further recommendations from the stroke service -PT OT  evaluation pending  Active Problems: Hyperlipidemia Continue Lipitor  Hypertension -BP currently stable, permissive hypertension, on HCTZ    Major depression, recurrent (HCC) -Continue Restoril  History of restless leg syndrome, neuropathy -Continue Neurontin, pramipexole    Seizure disorder ([place]) -Received loading dose of Keppra IV in ED, continue Keppra, neurology following   Code Status: Full CODE STATUS DVT Prophylaxis:  Lovenox  Family Communication: Discussed all imaging results, lab results, explained to the patient   Disposition Plan: Awaiting stroke service/neurology recommendation, PT OT evaluation.  Possible DC home today if cleared  Time Spent in minutes 35 minutes  Procedures:  CT angiogram head and neck, MRI brain  Consultants:   Neurology  Antimicrobials:   Anti-infectives (From admission, onward)   None         Medications  Scheduled Meds:   stroke: mapping our early stages of recovery book   Does not apply Once   aspirin  300 mg Rectal Daily   Or   aspirin  325 mg Oral Daily   atorvastatin  80 mg Oral Daily   enoxaparin (LOVENOX) injection  40 mg Subcutaneous Q24H   escitalopram  20 mg Oral Daily   gabapentin  100 mg Oral TID   hydrochlorothiazide  12.5 mg Oral Daily   levETIRAcetam  500 mg Oral BID   multivitamin with minerals  1 tablet Oral Daily   nicotine  14 mg Transdermal Daily   Continuous Infusions: PRN Meds:.acetaminophen **OR** acetaminophen (TYLENOL) oral liquid 160 mg/5 mL **OR** acetaminophen, albuterol, pramipexole, senna-docusate, temazepam      Subjective:   Nicte Sorber was seen and examined today.  Feels very tired and sleepy, did not sleep well last night.  Dysarthria however improving.  No headache or new focal weakness this morning.  Patient denies dizziness, chest pain, shortness of breath, abdominal pain, N/V/D/C. No acute events overnight.    Objective:   Vitals:   10/12/18 0315 10/12/18 0353  10/12/18 0525 10/12/18 0754  BP: 124/68 121/68 127/63 (!) 145/62  Pulse:  (!) 58 (!) 54 (!) 58  Resp: 16 16 16 16   Temp: (!) 97.3 F (36.3 C) 98.2 F (36.8 C) 98 F (36.7 C) 98.1 F (36.7 C)  TempSrc: Oral Oral Oral Oral  SpO2: 95% 95%  94%  Weight:      Height:        Intake/Output Summary (Last 24 hours) at 10/12/2018 1004 Last data filed at 10/11/2018 1942 Gross per 24 hour  Intake 100 ml  Output --  Net 100 ml     Wt Readings from Last 3 Encounters:  10/12/18 62.3 kg  09/27/18 61.2 kg  07/21/18 61.2 kg     Exam  General: Awake oriented x 3, NAD, speech improving  Eyes: PERLA  HEENT:   Cardiovascular: S1 S2 auscultated, no murmurs, RRR  Respiratory: Clear to auscultation bilaterally, no wheezing, rales or rhonchi  Gastrointestinal: Soft, nontender, nondistended, + bowel sounds  Ext: no pedal edema bilaterally  Neuro: Dysarthria improving, strength 4/5 RUE, RLE, strength 5/5 in the left UE, LLE  Musculoskeletal: No digital cyanosis, clubbing  Skin: No rashes  Psych: Normal affect and demeanor, alert and oriented x3    Data Reviewed:  I have personally reviewed following labs and imaging studies  Micro Results Recent Results (from the past 240 hour(s))  SARS Coronavirus 2 [facility] order, Performed in [place] hospital lab) Nasopharyngeal Nasopharyngeal Swab     Status: None   Collection Time: 10/11/18  5:57 PM   Specimen: Nasopharyngeal Swab  Result Value Ref Range Status   SARS Coronavirus 2 NEGATIVE NEGATIVE Final    Comment: (NOTE) If result is NEGATIVE SARS-CoV-2 target nucleic acids are NOT DETECTED. The SARS-CoV-2 RNA is generally detectable in upper and lower  respiratory specimens during the acute phase of infection. The lowest  concentration of SARS-CoV-2 viral copies this assay can detect is 250  copies / mL. A negative result does not preclude SARS-CoV-2 infection  and should not be used as the sole basis for treatment or other   patient management decisions.  A negative result may occur with  improper specimen collection / handling, submission of specimen other  than nasopharyngeal swab, presence of viral mutation(s) within the  areas targeted by this assay, and inadequate number of viral copies  (<250 copies / mL). A negative result must be combined with clinical  observations, patient history, and epidemiological information. If result is POSITIVE SARS-CoV-2 target nucleic acids are DETECTED. The SARS-CoV-2 RNA is generally detectable in upper and lower  respiratory specimens dur ing the acute phase of infection.  Positive  results are indicative of active infection with SARS-CoV-2.  Clinical  correlation with patient history and other diagnostic information is  necessary to determine  patient infection status.  Positive results do  not rule out bacterial infection or co-infection with other viruses. If result is PRESUMPTIVE POSTIVE SARS-CoV-2 nucleic acids MAY BE PRESENT.   A presumptive positive result was obtained on the submitted specimen  and confirmed on repeat testing.  While 2019 novel coronavirus  (SARS-CoV-2) nucleic acids may be present in the submitted sample  additional confirmatory testing may be necessary for epidemiological  and / or clinical management purposes  to differentiate between  SARS-CoV-2 and other Sarbecovirus currently known to infect humans.  If clinically indicated additional testing with an alternate test  methodology [phone]) is advised. The SARS-CoV-2 RNA is generally  detectable in upper and lower respiratory sp ecimens during the acute  phase of infection. The expected result is Negative. Fact Sheet for Patients:  StrictlyIdeas.no Fact Sheet for Healthcare Providers: BankingDealers.co.za This test is not yet approved or cleared by the [place] FDA and has been authorized for detection and/or diagnosis of SARS-CoV-2  by FDA under an Emergency Use Authorization (EUA).  This EUA will remain in effect (meaning this test can be used) for the duration of the COVID-19 declaration under Section 564(b)(1) of the Act, 21 U.S.C. section 360bbb-3(b)(1), unless the authorization is terminated or revoked sooner. Performed at [facility], [place] [address], [place], [place] [address]     Radiology Reports Ct Code Stroke Cta Head W/wo Contrast  Result Date: 10/11/2018 CLINICAL DATA:  Right-sided weakness. EXAM: CT ANGIOGRAPHY HEAD AND NECK TECHNIQUE: Multidetector CT imaging of the head and neck was performed using the standard protocol during bolus administration of intravenous contrast. Multiplanar CT image reconstructions and MIPs were obtained to evaluate the vascular anatomy. Carotid stenosis measurements (when applicable) are obtained utilizing NASCET criteria, using the distal internal carotid diameter as the denominator. CONTRAST:  157mL OMNIPAQUE IOHEXOL 350 MG/ML SOLN COMPARISON:  CT head 10/11/2018 FINDINGS: CTA NECK FINDINGS Aortic arch: Standard branching. Imaged portion shows no evidence of aneurysm or dissection. No significant stenosis of the major arch vessel origins. Right carotid system: Mild atherosclerotic calcification right common carotid artery and right carotid bifurcation without significant stenosis. Negative for dissection Left carotid system: Mild atherosclerotic calcification left carotid bifurcation without significant stenosis. Negative for dissection Vertebral arteries: Both vertebral arteries are patent to the basilar without stenosis. Skeleton: No acute abnormality Other neck: Negative Upper chest: Lung apices clear bilaterally. No mediastinal mass or adenopathy. Review of the MIP images confirms the above findings CTA HEAD FINDINGS Anterior circulation: Cavernous carotid widely patent bilaterally without significant stenosis. Anterior and middle cerebral arteries widely patent bilaterally  without stenosis or occlusion. Posterior circulation: Both vertebral arteries patent to the basilar. PICA patent bilaterally. Basilar widely patent. Superior cerebellar and posterior cerebral arteries widely patent bilaterally. Posterior communicating artery patent bilaterally. Venous sinuses: Normal enhancement Anatomic variants: None Review of the MIP images confirms the above findings IMPRESSION: 1. Negative for emergent large vessel occlusion 2. No significant carotid or vertebral artery stenosis in the neck 3. No significant intracranial stenosis. Electronically Signed   By: Franchot Gallo M.D.   On: 10/11/2018 17:16   Ct Code Stroke Cta Neck W/wo Contrast  Result Date: 10/11/2018 CLINICAL DATA:  Right-sided weakness. EXAM: CT ANGIOGRAPHY HEAD AND NECK TECHNIQUE: Multidetector CT imaging of the head and neck was performed using the standard protocol during bolus administration of intravenous contrast. Multiplanar CT image reconstructions and MIPs were obtained to evaluate the vascular anatomy. Carotid stenosis measurements (when applicable) are obtained utilizing NASCET criteria,  using the distal internal carotid diameter as the denominator. CONTRAST:  174mL OMNIPAQUE IOHEXOL 350 MG/ML SOLN COMPARISON:  CT head 10/11/2018 FINDINGS: CTA NECK FINDINGS Aortic arch: Standard branching. Imaged portion shows no evidence of aneurysm or dissection. No significant stenosis of the major arch vessel origins. Right carotid system: Mild atherosclerotic calcification right common carotid artery and right carotid bifurcation without significant stenosis. Negative for dissection Left carotid system: Mild atherosclerotic calcification left carotid bifurcation without significant stenosis. Negative for dissection Vertebral arteries: Both vertebral arteries are patent to the basilar without stenosis. Skeleton: No acute abnormality Other neck: Negative Upper chest: Lung apices clear bilaterally. No mediastinal mass or  adenopathy. Review of the MIP images confirms the above findings CTA HEAD FINDINGS Anterior circulation: Cavernous carotid widely patent bilaterally without significant stenosis. Anterior and middle cerebral arteries widely patent bilaterally without stenosis or occlusion. Posterior circulation: Both vertebral arteries patent to the basilar. PICA patent bilaterally. Basilar widely patent. Superior cerebellar and posterior cerebral arteries widely patent bilaterally. Posterior communicating artery patent bilaterally. Venous sinuses: Normal enhancement Anatomic variants: None Review of the MIP images confirms the above findings IMPRESSION: 1. Negative for emergent large vessel occlusion 2. No significant carotid or vertebral artery stenosis in the neck 3. No significant intracranial stenosis. Electronically Signed   By: Franchot Gallo M.D.   On: 10/11/2018 17:16   Mr Brain Wo Contrast  Result Date: 10/11/2018 CLINICAL DATA:  Weakness, right-sided. EXAM: MRI HEAD WITHOUT CONTRAST TECHNIQUE: Multiplanar, multiecho pulse sequences of the brain and surrounding structures were obtained without intravenous contrast. COMPARISON:  Head CT 10/11/2018 FINDINGS: BRAIN: There is no acute infarct, acute hemorrhage or extra-axial collection. Multifocal white matter hyperintensity, most commonly due to chronic ischemic microangiopathy. There is an old left basal ganglia small vessel infarct. The cerebral and cerebellar volume are age-appropriate. There is no hydrocephalus. The midline structures are normal. VASCULAR: The major intracranial arterial and venous sinus flow voids are normal. Susceptibility-sensitive sequences show no chronic microhemorrhage or superficial siderosis. SKULL AND UPPER CERVICAL SPINE: Calvarial bone marrow signal is normal. There is no skull base mass. The visualized upper cervical spine and soft tissues are normal. SINUSES/ORBITS: There are no fluid levels or advanced mucosal thickening. The mastoid air  cells and middle ear cavities are free of fluid. The orbits are normal. IMPRESSION: 1. No acute intracranial abnormality. 2. 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 Electronically Signed   By: Ulyses Jarred M.D.   On: 10/11/2018 19:54   Ct Head Code Stroke Wo Contrast  Result Date: 10/11/2018 CLINICAL DATA:  Code stroke.  Right-sided weakness. EXAM: CT HEAD WITHOUT CONTRAST TECHNIQUE: Contiguous axial images were obtained from the base of the skull through the vertex without intravenous contrast. COMPARISON:  CT head 10/10/2014.  MRI head 10/18/2017 FINDINGS: Brain: Negative for acute infarct.  Negative for hemorrhage or mass. Chronic ischemic changes in the basal ganglia bilaterally left greater than right similar to prior studies. Chronic ischemia extending into the left corona radiata unchanged. Benign-appearing cyst right basal ganglia unchanged. Negative for hydrocephalus. Vascular: Negative for hyperdense vessel Skull: Negative Sinuses/Orbits: Negative Other: None ASPECTS ([facility] Stroke Program Early CT Score) - Ganglionic level infarction (caudate, lentiform nuclei, internal capsule, insula, M1-M3 cortex): 7 - Supraganglionic infarction (M4-M6 cortex): 3 Total score (0-10 with 10 being normal): 10 IMPRESSION: 1. No acute abnormality 2. ASPECTS is 10 3. Chronic small vessel ischemia most notably in the left basal ganglia and corona radiata, unchanged from prior  studies. 4. These results were called by telephone at the time of interpretation on 10/11/2018 at 4:54 pm to provider [facility] , who verbally acknowledged these results. Electronically Signed   By: Franchot Gallo M.D.   On: 10/11/2018 16:54    Lab Data:  CBC: Recent Labs  Lab 10/11/18 1617 10/11/18 1634  WBC 4.8  --   NEUTROABS 2.6  --   HGB 12.7 14.6  HCT 37.2 43.0  MCV 97.1  --   PLT 183  --    Basic Metabolic Panel: Recent Labs  Lab 10/11/18 1617 10/11/18 1634  NA 133*  133*  K 3.8 3.8  CL 95* 95*  CO2 27  --   GLUCOSE 115* 110*  BUN 10 10  CREATININE 0.70 0.60  CALCIUM 9.1  --    GFR: Estimated Creatinine Clearance: 61.7 mL/min (by C-G formula based on SCr of 0.6 mg/dL). Liver Function Tests: Recent Labs  Lab 10/11/18 1617  AST 27  ALT 21  ALKPHOS 66  BILITOT 0.3  PROT 6.7  ALBUMIN 3.6   No results for input(s): LIPASE, AMYLASE in the last 168 hours. No results for input(s): AMMONIA in the last 168 hours. Coagulation Profile: Recent Labs  Lab 10/11/18 1617  INR 1.0   Cardiac Enzymes: No results for input(s): CKTOTAL, CKMB, CKMBINDEX, TROPONINI in the last 168 hours. BNP (last 3 results) No results for input(s): PROBNP in the last 8760 hours. HbA1C: No results for input(s): HGBA1C in the last 72 hours. CBG: Recent Labs  Lab 10/11/18 1600  GLUCAP 119*   Lipid Profile: Recent Labs    10/12/18 0344  CHOL 111  HDL 56  LDLCALC 39  TRIG 81  CHOLHDL 2.0   Thyroid Function Tests: No results for input(s): TSH, T4TOTAL, FREET4, T3FREE, THYROIDAB in the last 72 hours. Anemia Panel: No results for input(s): VITAMINB12, FOLATE, FERRITIN, TIBC, IRON, RETICCTPCT in the last 72 hours. Urine analysis:    Component Value Date/Time   COLORURINE STRAW (A) 10/11/2018 1752   APPEARANCEUR CLEAR 10/11/2018 1752   LABSPEC 1.020 10/11/2018 1752   PHURINE 6.0 10/11/2018 1752   GLUCOSEU NEGATIVE 10/11/2018 1752   HGBUR NEGATIVE 10/11/2018 1752   BILIRUBINUR NEGATIVE 10/11/2018 1752   KETONESUR NEGATIVE 10/11/2018 1752   PROTEINUR NEGATIVE 10/11/2018 1752   UROBILINOGEN 0.2 10/10/2014 1128   NITRITE NEGATIVE 10/11/2018 1752   LEUKOCYTESUR NEGATIVE 10/11/2018 1752     Amie Cowens M.D. [facility] 10/12/2018, 10:04 AM  Pager: [phone] Between 7am to 7pm - call Pager - [phone]  After 7pm go to [email or web address] - password [email or web address]  Call night coverage person covering after 7pm

## 2018-10-12 NOTE — ED Notes (Signed)
ED TO INPATIENT HANDOFF REPORT  ED Nurse Name and Phone #: Gwyndolyn Saxon [phone]  S Name/Age/Gender Anna Lyons 63 y.o. female Room/Bed: 050C/050C  Code Status   Code Status: Full Code  Home/SNF/Other Home Patient oriented to: self, place, time and situation Is this baseline? Yes   Triage Complete: Triage complete  Chief Complaint N/V/D, Fever, Slurred speach, general weakness, Loss of balance  Triage Note Pt states she beginning weak this morning at 0700. Pt states she felt fine prior to. Right sided weakness noted. Pt has had a previous stroke. Pt was able to drive this morning but was unable to place mask over mouth with right arm in triage. EDP/CN made aware. Labs drawn at triage.    Allergies Allergies  Allergen Reactions  . Codeine Itching    Level of Care/Admitting Diagnosis ED Disposition    ED Disposition Condition [facility] Area: [place] [[id number]]  Level of Care: Telemetry Medical [104]  Covid Evaluation: Asymptomatic Screening Protocol (No Symptoms)  Diagnosis: CVA (cerebral vascular accident) [id number]  Admitting Physician: M[place] Border Marshal.B[place]  Attending Physician: Laren Everts, [place]  Estimated length of stay: past midnight tomorrow  Certification:: I certify this patient will need inpatient services for at least 2 midnights  PT Class (Do Not Modify): Inpatient [101]  PT Acc Code (Do Not Modify): Private [1]       B Medical/Surgery History Past Medical History:  Diagnosis Date  . Angina   . Breast cyst   . Cancer ([place])   . Depression   . Hypertension   . RLS (restless legs syndrome) 11/16/2011  . Seizures ([place]) 2016   last seizure="last year, 2016" per pt.  . Stroke ([place]) 04/20/2011   a. 04/20/11 Left subcortical infarct treated w/ TPA;  b. 04/2011 Echo: EF 55-60%;  c. 04/2011 Normal Carotid u/s  d. Residual "walk w/limp on right; unable to grasp w/right hand"  . TIA (transient ischemic attack) 08/2010   . Tobacco abuse    a. quit @ time of CVA 04/2011.   Past Surgical History:  Procedure Laterality Date  . BREAST RECONSTRUCTION     bilaterally  . CARPAL TUNNEL RELEASE Left 1990's  . CARPAL TUNNEL RELEASE Left 12/08/2015  . CESAREAN SECTION  1979  . KNEE ARTHROSCOPY Left 1990's    cartilage repair  . LEFT HEART CATHETERIZATION WITH CORONARY ANGIOGRAM N/A 05/20/2011   Procedure: LEFT HEART CATHETERIZATION WITH CORONARY ANGIOGRAM;  Surgeon: Josue Hector, MD;  Location: [facility] CATH LAB;  Service: Cardiovascular;  Laterality: N/A;  . MASTECTOMY Bilateral 1980's   bilateral with reconstruction     A IV Location/Drains/Wounds Patient Lines/Drains/Airways Status   Active Line/Drains/Airways    Name:   Placement date:   Placement time:   Site:   Days:   Peripheral IV 10/11/18 Left Antecubital   10/11/18    1624    Antecubital   1          Intake/Output Last 24 hours  Intake/Output Summary (Last 24 hours) at 10/12/2018 0010 Last data filed at 10/11/2018 1942 Gross per 24 hour  Intake 100 ml  Output -  Net 100 ml    Labs/Imaging Results for orders placed or performed during the hospital encounter of 10/11/18 (from the past 48 hour(s))  CBG monitoring, ED     Status: Abnormal   Collection Time: 10/11/18  4:00 PM  Result Value Ref Range   Glucose-Capillary 119 (H) 70 - 99 mg/dL  Ethanol     Status: None   Collection Time: 10/11/18  4:17 PM  Result Value Ref Range   Alcohol, Ethyl (B) <10 <10 mg/dL    Comment: (NOTE) Lowest detectable limit for serum alcohol is 10 mg/dL. For medical purposes only. Performed at [facility], [place] [address] [place], [place] [address]   Protime-INR     Status: None   Collection Time: 10/11/18  4:17 PM  Result Value Ref Range   Prothrombin Time 12.6 11.4 - 15.2 seconds   INR 1.0 0.8 - 1.2    Comment: (NOTE) INR goal varies based on device and disease states. Performed at [facility], [place] [address] [place], [place] [address]    APTT     Status: None   Collection Time: 10/11/18  4:17 PM  Result Value Ref Range   aPTT 30 24 - 36 seconds    Comment: Performed at [place] [address] [place], [place] [address]  CBC     Status: Abnormal   Collection Time: 10/11/18  4:17 PM  Result Value Ref Range   WBC 4.8 4.0 - 10.5 K/uL   RBC 3.83 (L) 3.87 - 5.11 MIL/uL   Hemoglobin 12.7 12.0 - 15.0 g/dL   HCT 37.2 36.0 - 46.0 %   MCV 97.1 80.0 - 100.0 fL   MCH 33.2 26.0 - 34.0 pg   MCHC 34.1 30.0 - 36.0 g/dL   RDW 12.1 11.5 - 15.5 %   Platelets 183 150 - 400 K/uL   nRBC 0.0 0.0 - 0.2 %    Comment: Performed at [facility], [place] [address] [place], [place] [address]  Differential     Status: None   Collection Time: 10/11/18  4:17 PM  Result Value Ref Range   Neutrophils Relative % 53 %   Neutro Abs 2.6 1.7 - 7.7 K/uL   Lymphocytes Relative 37 %   Lymphs Abs 1.8 0.7 - 4.0 K/uL   Monocytes Relative 8 %   Monocytes Absolute 0.4 0.1 - 1.0 K/uL   Eosinophils Relative 1 %   Eosinophils Absolute 0.1 0.0 - 0.5 K/uL   Basophils Relative 1 %   Basophils Absolute 0.0 0.0 - 0.1 K/uL   Immature Granulocytes 0 %   Abs Immature Granulocytes 0.01 0.00 - 0.07 K/uL    Comment: Performed at [facility], [place] [address] [place], [place] [address]  Comprehensive metabolic panel     Status: Abnormal   Collection Time: 10/11/18  4:17 PM  Result Value Ref Range   Sodium 133 (L) 135 - 145 mmol/L   Potassium 3.8 3.5 - 5.1 mmol/L   Chloride 95 (L) 98 - 111 mmol/L   CO2 27 22 - 32 mmol/L   Glucose, Bld 115 (H) 70 - 99 mg/dL   BUN 10 8 - 23 mg/dL   Creatinine, Ser 0.70 0.44 - 1.00 mg/dL   Calcium 9.1 8.9 - 10.3 mg/dL   Total Protein 6.7 6.5 - 8.1 g/dL   Albumin 3.6 3.5 - 5.0 g/dL   AST 27 15 - 41 U/L   ALT 21 0 - 44 U/L   Alkaline Phosphatase 66 38 - 126 U/L   Total Bilirubin 0.3 0.3 - 1.2 mg/dL   GFR calc non Af Amer >60 >60 mL/min   GFR calc Af Amer >60 >60 mL/min   Anion gap 11 5 - 15    Comment:  Performed at [facility], [place] [address] [place], [place] [address]  I-stat chem 8, ED     Status: Abnormal   Collection Time: 10/11/18  4:34 PM  Result Value Ref Range   Sodium 133 (L) 135 - 145 mmol/L   Potassium 3.8 3.5 - 5.1 mmol/L   Chloride 95 (L) 98 - 111 mmol/L   BUN 10 8 - 23 mg/dL   Creatinine, Ser 0.60 0.44 - 1.00 mg/dL   Glucose, Bld 110 (H) 70 - 99 mg/dL   Calcium, Ion 1.19 1.15 - 1.40 mmol/L   TCO2 28 22 - 32 mmol/L   Hemoglobin 14.6 12.0 - 15.0 g/dL   HCT 43.0 36.0 - 46.0 %  Urine rapid drug screen (hosp performed)     Status: Abnormal   Collection Time: 10/11/18  5:52 PM  Result Value Ref Range   Opiates NONE DETECTED NONE DETECTED   Cocaine NONE DETECTED NONE DETECTED   Benzodiazepines POSITIVE (A) NONE DETECTED   Amphetamines NONE DETECTED NONE DETECTED   Tetrahydrocannabinol NONE DETECTED NONE DETECTED   Barbiturates NONE DETECTED NONE DETECTED    Comment: (NOTE) DRUG SCREEN FOR MEDICAL PURPOSES ONLY.  IF CONFIRMATION IS NEEDED FOR ANY PURPOSE, NOTIFY LAB WITHIN 5 DAYS. LOWEST DETECTABLE LIMITS FOR URINE DRUG SCREEN Drug Class                     Cutoff (ng/mL) Amphetamine and metabolites    1000 Barbiturate and metabolites    200 Benzodiazepine                 [id number] Tricyclics and metabolites     300 Opiates and metabolites        300 Cocaine and metabolites        300 THC                            50 Performed at [facility], [place] [address] [place], [place] [address]   Urinalysis, Routine w reflex microscopic     Status: Abnormal   Collection Time: 10/11/18  5:52 PM  Result Value Ref Range   Color, Urine STRAW (A) YELLOW   APPearance CLEAR CLEAR   Specific Gravity, Urine 1.020 1.005 - 1.030   pH 6.0 5.0 - 8.0   Glucose, UA NEGATIVE NEGATIVE mg/dL   Hgb urine dipstick NEGATIVE NEGATIVE   Bilirubin Urine NEGATIVE NEGATIVE   Ketones, ur NEGATIVE NEGATIVE mg/dL   Protein, ur NEGATIVE NEGATIVE mg/dL   Nitrite NEGATIVE NEGATIVE    Leukocytes,Ua NEGATIVE NEGATIVE    Comment: Performed at [place] [address] [place], [place] [address]  SARS Coronavirus 2 [facility] order, Performed in [facility] hospital lab) Nasopharyngeal Nasopharyngeal Swab     Status: None   Collection Time: 10/11/18  5:57 PM   Specimen: Nasopharyngeal Swab  Result Value Ref Range   SARS Coronavirus 2 NEGATIVE NEGATIVE    Comment: (NOTE) If result is NEGATIVE SARS-CoV-2 target nucleic acids are NOT DETECTED. The SARS-CoV-2 RNA is generally detectable in upper and lower  respiratory specimens during the acute phase of infection. The lowest  concentration of SARS-CoV-2 viral copies this assay can detect is 250  copies / mL. A negative result does not preclude SARS-CoV-2 infection  and should not be used as the sole basis for treatment or other  patient management decisions.  A negative result may occur with  improper specimen collection / handling,  submission of specimen other  than nasopharyngeal swab, presence of viral mutation(s) within the  areas targeted by this assay, and inadequate number of viral copies  (<250 copies / mL). A negative result must be combined with clinical  observations, patient history, and epidemiological information. If result is POSITIVE SARS-CoV-2 target nucleic acids are DETECTED. The SARS-CoV-2 RNA is generally detectable in upper and lower  respiratory specimens dur ing the acute phase of infection.  Positive  results are indicative of active infection with SARS-CoV-2.  Clinical  correlation with patient history and other diagnostic information is  necessary to determine patient infection status.  Positive results do  not rule out bacterial infection or co-infection with other viruses. If result is PRESUMPTIVE POSTIVE SARS-CoV-2 nucleic acids MAY BE PRESENT.   A presumptive positive result was obtained on the submitted specimen  and confirmed on repeat testing.  While 2019 novel coronavirus   (SARS-CoV-2) nucleic acids may be present in the submitted sample  additional confirmatory testing may be necessary for epidemiological  and / or clinical management purposes  to differentiate between  SARS-CoV-2 and other Sarbecovirus currently known to infect humans.  If clinically indicated additional testing with an alternate test  methodology [phone]) is advised. The SARS-CoV-2 RNA is generally  detectable in upper and lower respiratory sp ecimens during the acute  phase of infection. The expected result is Negative. Fact Sheet for Patients:  StrictlyIdeas.no Fact Sheet for Healthcare Providers: BankingDealers.co.za This test is not yet approved or cleared by the [place] FDA and has been authorized for detection and/or diagnosis of SARS-CoV-2 by FDA under an Emergency Use Authorization (EUA).  This EUA will remain in effect (meaning this test can be used) for the duration of the COVID-19 declaration under Section 564(b)(1) of the Act, 21 U.S.C. section 360bbb-3(b)(1), unless the authorization is terminated or revoked sooner. Performed at [facility], [place] [address] [place], [place] [address]    Ct Code Stroke Cta Head W/wo Contrast  Result Date: 10/11/2018 CLINICAL DATA:  Right-sided weakness. EXAM: CT ANGIOGRAPHY HEAD AND NECK TECHNIQUE: Multidetector CT imaging of the head and neck was performed using the standard protocol during bolus administration of intravenous contrast. Multiplanar CT image reconstructions and MIPs were obtained to evaluate the vascular anatomy. Carotid stenosis measurements (when applicable) are obtained utilizing NASCET criteria, using the distal internal carotid diameter as the denominator. CONTRAST:  112mL OMNIPAQUE IOHEXOL 350 MG/ML SOLN COMPARISON:  CT head 10/11/2018 FINDINGS: CTA NECK FINDINGS Aortic arch: Standard branching. Imaged portion shows no evidence of aneurysm or dissection. No  significant stenosis of the major arch vessel origins. Right carotid system: Mild atherosclerotic calcification right common carotid artery and right carotid bifurcation without significant stenosis. Negative for dissection Left carotid system: Mild atherosclerotic calcification left carotid bifurcation without significant stenosis. Negative for dissection Vertebral arteries: Both vertebral arteries are patent to the basilar without stenosis. Skeleton: No acute abnormality Other neck: Negative Upper chest: Lung apices clear bilaterally. No mediastinal mass or adenopathy. Review of the MIP images confirms the above findings CTA HEAD FINDINGS Anterior circulation: Cavernous carotid widely patent bilaterally without significant stenosis. Anterior and middle cerebral arteries widely patent bilaterally without stenosis or occlusion. Posterior circulation: Both vertebral arteries patent to the basilar. PICA patent bilaterally. Basilar widely patent. Superior cerebellar and posterior cerebral arteries widely patent bilaterally. Posterior communicating artery patent bilaterally. Venous sinuses: Normal enhancement Anatomic variants: None Review of the MIP images confirms the above findings IMPRESSION: 1. Negative for emergent large  vessel occlusion 2. No significant carotid or vertebral artery stenosis in the neck 3. No significant intracranial stenosis. Electronically Signed   By: Franchot Gallo M.D.   On: 10/11/2018 17:16   Ct Code Stroke Cta Neck W/wo Contrast  Result Date: 10/11/2018 CLINICAL DATA:  Right-sided weakness. EXAM: CT ANGIOGRAPHY HEAD AND NECK TECHNIQUE: Multidetector CT imaging of the head and neck was performed using the standard protocol during bolus administration of intravenous contrast. Multiplanar CT image reconstructions and MIPs were obtained to evaluate the vascular anatomy. Carotid stenosis measurements (when applicable) are obtained utilizing NASCET criteria, using the distal internal carotid  diameter as the denominator. CONTRAST:  158mL OMNIPAQUE IOHEXOL 350 MG/ML SOLN COMPARISON:  CT head 10/11/2018 FINDINGS: CTA NECK FINDINGS Aortic arch: Standard branching. Imaged portion shows no evidence of aneurysm or dissection. No significant stenosis of the major arch vessel origins. Right carotid system: Mild atherosclerotic calcification right common carotid artery and right carotid bifurcation without significant stenosis. Negative for dissection Left carotid system: Mild atherosclerotic calcification left carotid bifurcation without significant stenosis. Negative for dissection Vertebral arteries: Both vertebral arteries are patent to the basilar without stenosis. Skeleton: No acute abnormality Other neck: Negative Upper chest: Lung apices clear bilaterally. No mediastinal mass or adenopathy. Review of the MIP images confirms the above findings CTA HEAD FINDINGS Anterior circulation: Cavernous carotid widely patent bilaterally without significant stenosis. Anterior and middle cerebral arteries widely patent bilaterally without stenosis or occlusion. Posterior circulation: Both vertebral arteries patent to the basilar. PICA patent bilaterally. Basilar widely patent. Superior cerebellar and posterior cerebral arteries widely patent bilaterally. Posterior communicating artery patent bilaterally. Venous sinuses: Normal enhancement Anatomic variants: None Review of the MIP images confirms the above findings IMPRESSION: 1. Negative for emergent large vessel occlusion 2. No significant carotid or vertebral artery stenosis in the neck 3. No significant intracranial stenosis. Electronically Signed   By: Franchot Gallo M.D.   On: 10/11/2018 17:16   Mr Brain Wo Contrast  Result Date: 10/11/2018 CLINICAL DATA:  Weakness, right-sided. EXAM: MRI HEAD WITHOUT CONTRAST TECHNIQUE: Multiplanar, multiecho pulse sequences of the brain and surrounding structures were obtained without intravenous contrast. COMPARISON:  Head  CT 10/11/2018 FINDINGS: BRAIN: There is no acute infarct, acute hemorrhage or extra-axial collection. Multifocal white matter hyperintensity, most commonly due to chronic ischemic microangiopathy. There is an old left basal ganglia small vessel infarct. The cerebral and cerebellar volume are age-appropriate. There is no hydrocephalus. The midline structures are normal. VASCULAR: The major intracranial arterial and venous sinus flow voids are normal. Susceptibility-sensitive sequences show no chronic microhemorrhage or superficial siderosis. SKULL AND UPPER CERVICAL SPINE: Calvarial bone marrow signal is normal. There is no skull base mass. The visualized upper cervical spine and soft tissues are normal. SINUSES/ORBITS: There are no fluid levels or advanced mucosal thickening. The mastoid air cells and middle ear cavities are free of fluid. The orbits are normal. IMPRESSION: 1. No acute intracranial abnormality. 2. 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 Electronically Signed   By: Ulyses Jarred M.D.   On: 10/11/2018 19:54   Ct Head Code Stroke Wo Contrast  Result Date: 10/11/2018 CLINICAL DATA:  Code stroke.  Right-sided weakness. EXAM: CT HEAD WITHOUT CONTRAST TECHNIQUE: Contiguous axial images were obtained from the base of the skull through the vertex without intravenous contrast. COMPARISON:  CT head 10/10/2014.  MRI head 10/18/2017 FINDINGS: Brain: Negative for acute infarct.  Negative for hemorrhage or mass. Chronic ischemic changes in the basal  ganglia bilaterally left greater than right similar to prior studies. Chronic ischemia extending into the left corona radiata unchanged. Benign-appearing cyst right basal ganglia unchanged. Negative for hydrocephalus. Vascular: Negative for hyperdense vessel Skull: Negative Sinuses/Orbits: Negative Other: None ASPECTS ([place] Stroke Program Early CT Score) - Ganglionic level infarction (caudate, lentiform nuclei,  internal capsule, insula, M1-M3 cortex): 7 - Supraganglionic infarction (M4-M6 cortex): 3 Total score (0-10 with 10 being normal): 10 IMPRESSION: 1. No acute abnormality 2. ASPECTS is 10 3. Chronic small vessel ischemia most notably in the left basal ganglia and corona radiata, unchanged from prior studies. 4. These results were called by telephone at the time of interpretation on 10/11/2018 at 4:54 pm to provider [facility] , who verbally acknowledged these results. Electronically Signed   By: Franchot Gallo M.D.   On: 10/11/2018 16:54    Pending Labs Unresulted Labs (From admission, onward)    Start     Ordered   10/12/18 0500  Hemoglobin A1c  Tomorrow morning,   R     10/11/18 1714   10/12/18 0500  Lipid panel  Tomorrow morning,   R    Comments: Fasting    10/11/18 1714          Vitals/Pain Today's Vitals   10/11/18 1800 10/11/18 1815 10/11/18 1830 10/11/18 1845  BP: (!) 182/88 (!) 165/89 (!) 160/73 (!) 168/74  Pulse: 63 (!) 59 (!) 54 (!) 56  Resp: (!) 22 18 (!) 22 13  Temp:      TempSrc:      SpO2: 95% 94% 95% 95%  Weight:      Height:      PainSc:        Isolation Precautions No active isolations  Medications Medications   stroke: mapping our early stages of recovery book (has no administration in time range)  aspirin suppository 300 mg ( Rectal See Alternative 10/11/18 2314)    Or  aspirin tablet 325 mg (325 mg Oral Given 10/11/18 2314)  aspirin EC tablet 325 mg (has no administration in time range)  atorvastatin (LIPITOR) tablet 80 mg (80 mg Oral Given 10/11/18 2315)  hydrochlorothiazide (MICROZIDE) capsule 12.5 mg (has no administration in time range)  escitalopram (LEXAPRO) tablet 20 mg (20 mg Oral Given 10/11/18 2315)  nicotine (NICODERM CQ - dosed in mg/24 hours) patch 14 mg (has no administration in time range)  temazepam (RESTORIL) capsule 15-30 mg (15 mg Oral Given 10/11/18 2315)  gabapentin (NEURONTIN) capsule 100 mg (100 mg Oral Given 10/11/18 2314)  levETIRAcetam  (KEPPRA) tablet 500 mg (500 mg Oral Given 10/11/18 2315)  pramipexole (MIRAPEX) tablet 0.5 mg (has no administration in time range)  multivitamin with minerals tablet 1 tablet (1 tablet Oral Given 10/11/18 2314)  albuterol (VENTOLIN HFA) 108 (90 Base) MCG/ACT inhaler 2 puff (has no administration in time range)   stroke: mapping our early stages of recovery book (has no administration in time range)  acetaminophen (TYLENOL) tablet 650 mg (has no administration in time range)    Or  acetaminophen (TYLENOL) solution 650 mg (has no administration in time range)    Or  acetaminophen (TYLENOL) suppository 650 mg (has no administration in time range)  senna-docusate (Senokot-S) tablet 1 tablet (has no administration in time range)  enoxaparin (LOVENOX) injection 40 mg (40 mg Subcutaneous Given 10/11/18 2318)  iohexol (OMNIPAQUE) 350 MG/ML injection 100 mL (100 mLs Intravenous Contrast Given 10/11/18 1651)  levETIRAcetam (KEPPRA) IVPB 1000 mg/100 mL premix (0 mg Intravenous Stopped 10/11/18 1942)  Mobility walks High fall risk   Focused Assessments Neuro Assessment Handoff:  Swallow screen pass? Yes  Cardiac Rhythm: Normal sinus rhythm NIH Stroke Scale ( + Modified Stroke Scale Criteria)  Interval: Initial Level of Consciousness (1a.)   : Alert, keenly responsive LOC Questions (1b. )   +: Answers both questions correctly LOC Commands (1c. )   + : Performs both tasks correctly Best Gaze (2. )  +: Normal Visual (3. )  +: No visual loss Facial Palsy (4. )    : Normal symmetrical movements Motor Arm, Left (5a. )   +: No drift Motor Arm, Right (5b. )   +: Drift Motor Leg, Left (6a. )   +: No drift Motor Leg, Right (6b. )   +: Drift Limb Ataxia (7. ): Absent Sensory (8. )   +: Normal, no sensory loss Best Language (9. )   +: No aphasia Dysarthria (10. ): Mild-to-moderate dysarthria, patient slurs at least some words and, at worst, can be understood with some difficulty Extinction/Inattention  (11.)   +: No Abnormality Modified SS Total  +: 2 Complete NIHSS TOTAL: 3 Last date known well: 10/10/18 Last time known well: 1500 Neuro Assessment: Exceptions to WDL Neuro Checks:   Initial (10/11/18 1621)  Last Documented NIHSS Modified Score: 2 (10/11/18 1700) Has TPA been given? No If patient is a Neuro Trauma and patient is going to OR before floor call report to [place] nurse: [phone] or [phone]     R Recommendations: See Admitting Provider Note  Report given to:   Additional Notes:

## 2018-10-12 NOTE — TOC Transition Note (Signed)
Transition of Care [facility]) - CM/SW Discharge Note   Patient Details  Name: Anna Lyons MRN: [id number] Date of Birth: [birth year]  [facility] CM/SW Contact:  Pollie Friar, RN Phone Number: 10/12/2018, 2:02 PM   Clinical Narrative:    Pt is discharging home with self care. No f/u per PT/OT.  Pt has PCP, insurance and transportation home.    Final next level of care: Home/Self Care Barriers to Discharge: No Barriers Identified   Patient Goals and CMS Choice        Discharge Placement                       Discharge Plan and Services                                     Social Determinants of Health (SDOH) Interventions     Readmission Risk Interventions No flowsheet data found.

## 2018-10-12 NOTE — Progress Notes (Addendum)
STROKE TEAM PROGRESS NOTE   INTERVAL HISTORY Daughter at bedside. Pt condition much improved and near baseline. Still complains some worsening weakness at right side than baseline. Also complains of right foot cold, burning sensation, on gabapentin at home. Follows with Dr. Leonie Man in clinic. Had a lot of stress recently, her best friend and whose daughter died recently and she is very depressed. She has to help picking up her grandchildren lately which makes her very stressed too.   Vitals:   10/12/18 0315 10/12/18 0353 10/12/18 0525 10/12/18 0754  BP: 124/68 121/68 127/63 (!) 145/62  Pulse:  (!) 58 (!) 54 (!) 58  Resp: 16 16 16 16   Temp: (!) 97.3 F (36.3 C) 98.2 F (36.8 C) 98 F (36.7 C) 98.1 F (36.7 C)  TempSrc: Oral Oral Oral Oral  SpO2: 95% 95%  94%  Weight:      Height:        CBC:  Recent Labs  Lab 10/11/18 1617 10/11/18 1634  WBC 4.8  --   NEUTROABS 2.6  --   HGB 12.7 14.6  HCT 37.2 43.0  MCV 97.1  --   PLT 183  --     Basic Metabolic Panel:  Recent Labs  Lab 10/11/18 1617 10/11/18 1634  NA 133* 133*  K 3.8 3.8  CL 95* 95*  CO2 27  --   GLUCOSE 115* 110*  BUN 10 10  CREATININE 0.70 0.60  CALCIUM 9.1  --    Lipid Panel:     Component Value Date/Time   CHOL 111 10/12/2018 0344   TRIG 81 10/12/2018 0344   HDL 56 10/12/2018 0344   CHOLHDL 2.0 10/12/2018 0344   VLDL 16 10/12/2018 0344   LDLCALC 39 10/12/2018 0344   HgbA1c:  Lab Results  Component Value Date   HGBA1C 5.6 09/27/2018   Urine Drug Screen:     Component Value Date/Time   LABOPIA NONE DETECTED 10/11/2018 1752   COCAINSCRNUR NONE DETECTED 10/11/2018 1752   LABBENZ POSITIVE (A) 10/11/2018 1752   AMPHETMU NONE DETECTED 10/11/2018 1752   THCU NONE DETECTED 10/11/2018 1752   LABBARB NONE DETECTED 10/11/2018 1752    Alcohol Level     Component Value Date/Time   ETH <10 10/11/2018 1617    IMAGING Ct Code Stroke Cta Head W/wo Contrast  Result Date: 10/11/2018 CLINICAL DATA:   Right-sided weakness. EXAM: CT ANGIOGRAPHY HEAD AND NECK TECHNIQUE: Multidetector CT imaging of the head and neck was performed using the standard protocol during bolus administration of intravenous contrast. Multiplanar CT image reconstructions and MIPs were obtained to evaluate the vascular anatomy. Carotid stenosis measurements (when applicable) are obtained utilizing NASCET criteria, using the distal internal carotid diameter as the denominator. CONTRAST:  176mL OMNIPAQUE IOHEXOL 350 MG/ML SOLN COMPARISON:  CT head 10/11/2018 FINDINGS: CTA NECK FINDINGS Aortic arch: Standard branching. Imaged portion shows no evidence of aneurysm or dissection. No significant stenosis of the major arch vessel origins. Right carotid system: Mild atherosclerotic calcification right common carotid artery and right carotid bifurcation without significant stenosis. Negative for dissection Left carotid system: Mild atherosclerotic calcification left carotid bifurcation without significant stenosis. Negative for dissection Vertebral arteries: Both vertebral arteries are patent to the basilar without stenosis. Skeleton: No acute abnormality Other neck: Negative Upper chest: Lung apices clear bilaterally. No mediastinal mass or adenopathy. Review of the MIP images confirms the above findings CTA HEAD FINDINGS Anterior circulation: Cavernous carotid widely patent bilaterally without significant stenosis. Anterior and middle cerebral arteries widely  patent bilaterally without stenosis or occlusion. Posterior circulation: Both vertebral arteries patent to the basilar. PICA patent bilaterally. Basilar widely patent. Superior cerebellar and posterior cerebral arteries widely patent bilaterally. Posterior communicating artery patent bilaterally. Venous sinuses: Normal enhancement Anatomic variants: None Review of the MIP images confirms the above findings IMPRESSION: 1. Negative for emergent large vessel occlusion 2. No significant carotid or  vertebral artery stenosis in the neck 3. No significant intracranial stenosis. Electronically Signed   By: Franchot Gallo M.D.   On: 10/11/2018 17:16   Ct Code Stroke Cta Neck W/wo Contrast  Result Date: 10/11/2018 CLINICAL DATA:  Right-sided weakness. EXAM: CT ANGIOGRAPHY HEAD AND NECK TECHNIQUE: Multidetector CT imaging of the head and neck was performed using the standard protocol during bolus administration of intravenous contrast. Multiplanar CT image reconstructions and MIPs were obtained to evaluate the vascular anatomy. Carotid stenosis measurements (when applicable) are obtained utilizing NASCET criteria, using the distal internal carotid diameter as the denominator. CONTRAST:  164mL OMNIPAQUE IOHEXOL 350 MG/ML SOLN COMPARISON:  CT head 10/11/2018 FINDINGS: CTA NECK FINDINGS Aortic arch: Standard branching. Imaged portion shows no evidence of aneurysm or dissection. No significant stenosis of the major arch vessel origins. Right carotid system: Mild atherosclerotic calcification right common carotid artery and right carotid bifurcation without significant stenosis. Negative for dissection Left carotid system: Mild atherosclerotic calcification left carotid bifurcation without significant stenosis. Negative for dissection Vertebral arteries: Both vertebral arteries are patent to the basilar without stenosis. Skeleton: No acute abnormality Other neck: Negative Upper chest: Lung apices clear bilaterally. No mediastinal mass or adenopathy. Review of the MIP images confirms the above findings CTA HEAD FINDINGS Anterior circulation: Cavernous carotid widely patent bilaterally without significant stenosis. Anterior and middle cerebral arteries widely patent bilaterally without stenosis or occlusion. Posterior circulation: Both vertebral arteries patent to the basilar. PICA patent bilaterally. Basilar widely patent. Superior cerebellar and posterior cerebral arteries widely patent bilaterally. Posterior  communicating artery patent bilaterally. Venous sinuses: Normal enhancement Anatomic variants: None Review of the MIP images confirms the above findings IMPRESSION: 1. Negative for emergent large vessel occlusion 2. No significant carotid or vertebral artery stenosis in the neck 3. No significant intracranial stenosis. Electronically Signed   By: Franchot Gallo M.D.   On: 10/11/2018 17:16   Mr Brain Wo Contrast  Result Date: 10/11/2018 CLINICAL DATA:  Weakness, right-sided. EXAM: MRI HEAD WITHOUT CONTRAST TECHNIQUE: Multiplanar, multiecho pulse sequences of the brain and surrounding structures were obtained without intravenous contrast. COMPARISON:  Head CT 10/11/2018 FINDINGS: BRAIN: There is no acute infarct, acute hemorrhage or extra-axial collection. Multifocal white matter hyperintensity, most commonly due to chronic ischemic microangiopathy. There is an old left basal ganglia small vessel infarct. The cerebral and cerebellar volume are age-appropriate. There is no hydrocephalus. The midline structures are normal. VASCULAR: The major intracranial arterial and venous sinus flow voids are normal. Susceptibility-sensitive sequences show no chronic microhemorrhage or superficial siderosis. SKULL AND UPPER CERVICAL SPINE: Calvarial bone marrow signal is normal. There is no skull base mass. The visualized upper cervical spine and soft tissues are normal. SINUSES/ORBITS: There are no fluid levels or advanced mucosal thickening. The mastoid air cells and middle ear cavities are free of fluid. The orbits are normal. IMPRESSION: 1. No acute intracranial abnormality. 2. 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 Electronically Signed   By: Ulyses Jarred M.D.   On: 10/11/2018 19:54   Ct Head Code Stroke Wo Contrast  Result Date: 10/11/2018 CLINICAL  DATA:  Code stroke.  Right-sided weakness. EXAM: CT HEAD WITHOUT CONTRAST TECHNIQUE: Contiguous axial images were obtained  from the base of the skull through the vertex without intravenous contrast. COMPARISON:  CT head 10/10/2014.  MRI head 10/18/2017 FINDINGS: Brain: Negative for acute infarct.  Negative for hemorrhage or mass. Chronic ischemic changes in the basal ganglia bilaterally left greater than right similar to prior studies. Chronic ischemia extending into the left corona radiata unchanged. Benign-appearing cyst right basal ganglia unchanged. Negative for hydrocephalus. Vascular: Negative for hyperdense vessel Skull: Negative Sinuses/Orbits: Negative Other: None ASPECTS (Bertrand Stroke Program Early CT Score) - Ganglionic level infarction (caudate, lentiform nuclei, internal capsule, insula, M1-M3 cortex): 7 - Supraganglionic infarction (M4-M6 cortex): 3 Total score (0-10 with 10 being normal): 10 IMPRESSION: 1. No acute abnormality 2. ASPECTS is 10 3. Chronic small vessel ischemia most notably in the left basal ganglia and corona radiata, unchanged from prior studies. 4. These results were called by telephone at the time of interpretation on 10/11/2018 at 4:54 pm to provider [facility] , who verbally acknowledged these results. Electronically Signed   By: Franchot Gallo M.D.   On: 10/11/2018 16:54    PHYSICAL EXAM  Temp:  [97.3 F (36.3 C)-98.6 F (37 C)] 98.1 F (36.7 C) (09/10 1300) Pulse Rate:  [27-63] 58 (09/10 0754) Resp:  [10-22] 18 (09/10 1300) BP: (120-182)/(62-91) 120/91 (09/10 1300) SpO2:  [90 %-97 %] 97 % (09/10 1300) FiO2 (%):  [21 %] 21 % (09/09 2001) Weight:  [61.2 kg-62.3 kg] 62.3 kg (09/10 0116)  General - Well nourished, well developed, in no apparent distress.  Ophthalmologic - fundi not visualized due to noncooperation.  Cardiovascular - Regular rate and rhythm.  Mental Status -  Level of arousal and orientation to time, place, and person were intact. Language including expression, naming, repetition, comprehension was assessed and found intact.  Cranial Nerves II - XII - II -  Visual field intact OU. III, IV, VI - Extraocular movements intact. V - Facial sensation intact bilaterally. VII - Facial movement intact bilaterally. VIII - Hearing & vestibular intact bilaterally. X - Palate elevates symmetrically. XI - Chin turning & shoulder shrug intact bilaterally. XII - Tongue protrusion intact.  Motor Strength - The patient's strength was normal in LUE and LLE, RUE no drift but decreased hand griping, RLE giveaway weakness.  Bulk was normal and fasciculations were absent.   Motor Tone - Muscle tone was assessed at the neck and appendages and was normal.  Reflexes - The patient's reflexes were symmetrical in all extremities and she had no pathological reflexes.  Sensory - Light touch, temperature/pinprick were assessed and were decreased on the right, about 80% of the left.    Coordination - The patient had normal movements in the hands and feet with no ataxia or dysmetria.  Tremor was absent.  Gait and Station - deferred.   ASSESSMENT/PLAN Ms. CHARLEY ISKHAKOV is a 63 y.o. female with history of tobacco abuse, TIA, stroke, seizures on Keppra 500 mg twice daily, restless leg syndrome, hypertension, depression presenting with dysarthria.   TIA vs. Anxiety - pt has a lot of stress recently  Code Stroke CT head No acute abnormality. L basal ganglia and corona radiata small vessel disease. Atrophy. ASPECTS 10.     CTA head & neck no ELVO. No significant stenosis  MRI  No infarct. Old L basal ganglia infarct and small vessel disease.   2D Echo June 2020 EF 60-65%. No source of embolus  EEG normal   LDL 39  HgbA1c 5.6  Lovenox 40 mg sq daily for VTE prophylaxis  aspirin 325 mg daily prior to admission, now on aspirin 81 mg daily and clopidogrel 75 mg daily. Continue DAPT x 3 weeks then PLAVIX alone.    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 Mild right sided residue but able to walk without assistance  TIA in 2012 and 2014 as per daughter  Right foot neuropathy  Was on gabapentin, effective  Still complains of right foot cold, burning feeling  Continue gabapentin  Follow up with Dr. Leonie Man 9/22 to consider EMG  Hypertension  Stable . BP goal normotensive  Hyperlipidemia  Home meds:  lipitor 80, resumed in hospital  LDL 39, goal < 70  Continue statin at discharge  Other Stroke Risk Factors  Former Cigarette smoker, quit 2013  ETOH use, advised to drink no more than 1 drink(s) a day  Other Active Problems  RECURRENT major depression  RLS  Hospital day # 1  Neurology will sign off. Please call with questions. Pt will follow up with stroke clinic Dr. Leonie Man at [facility] on 10/24/18. Thanks for the consult.  Rosalin Hawking, MD PhD Stroke Neurology 10/12/2018 3:31 PM    To contact Stroke Continuity provider, please refer to [email or web address] After hours, contact General Neurology

## 2018-10-13 ENCOUNTER — Other Ambulatory Visit: Payer: Self-pay | Admitting: Neurology

## 2018-10-13 LAB — HEMOGLOBIN A1C
Hgb A1c MFr Bld: 5.3 % (ref 4.8–5.6)
Mean Plasma Glucose: 105 mg/dL

## 2018-10-16 ENCOUNTER — Other Ambulatory Visit: Payer: Self-pay

## 2018-10-16 MED ORDER — LEVETIRACETAM 500 MG PO TABS: 500.0000 mg | ORAL_TABLET | Freq: Two times a day (BID) | ORAL | 1 refills | Status: DC

## 2018-10-17 ENCOUNTER — Other Ambulatory Visit: Payer: Self-pay

## 2018-10-17 MED ORDER — PRAMIPEXOLE DIHYDROCHLORIDE 0.5 MG PO TABS: 0.5000 mg | ORAL_TABLET | Freq: Every evening | ORAL | 1 refills | Status: DC | PRN

## 2018-10-17 MED ORDER — HYDROCHLOROTHIAZIDE 12.5 MG PO CAPS: 12.5000 mg | ORAL_CAPSULE | Freq: Every day | ORAL | 3 refills | Status: DC

## 2018-10-17 MED ORDER — VIIBRYD 40 MG PO TABS: 40.0000 mg | ORAL_TABLET | Freq: Every day | ORAL | 1 refills | Status: DC

## 2018-10-17 MED ORDER — LEVETIRACETAM 500 MG PO TABS: 500.0000 mg | ORAL_TABLET | Freq: Two times a day (BID) | ORAL | 1 refills | Status: DC

## 2018-10-24 ENCOUNTER — Other Ambulatory Visit: Payer: Self-pay

## 2018-10-24 ENCOUNTER — Ambulatory Visit: Payer: Medicare Other | Admitting: Neurology

## 2018-10-24 ENCOUNTER — Encounter: Payer: Self-pay | Admitting: Neurology

## 2018-10-24 VITALS — BP 129/80 | HR 70 | Temp 96.0°F | Wt 136.8 lb

## 2018-10-24 DIAGNOSIS — R202 Paresthesia of skin: Secondary | ICD-10-CM | POA: Diagnosis not present

## 2018-10-24 MED ORDER — GABAPENTIN 100 MG PO CAPS: 200.0000 mg | ORAL_CAPSULE | Freq: Three times a day (TID) | ORAL | 3 refills | Status: DC

## 2018-10-24 NOTE — Patient Instructions (Signed)
I had a long discussion with the patient regarding right foot numbness which may be from peroneal neuropathy as well as her prior history of restless leg syndrome.  I recommend increasing gabapentin dose to 200 mg 3 times daily if tolerated.  Check EMG nerve conduction study.  Continue aspirin and Plavix till October 2 and then discontinue Plavix and stay on aspirin alone for stroke prevention.  Maintain strict control of hypertension with blood pressure goal below 130/90, lipids with LDL cholesterol goal below 70 mg percent and diabetes with hemoglobin A1c goal below 6.5%.  She was encouraged to eat a healthy diet with lots of fruits vegetables cereals and whole grains and to exercise and not gain weight.  She will return for follow-up in the future in 6 months with my nurse practitioner Janett Billow or call earlier if necessary.

## 2018-10-24 NOTE — Progress Notes (Signed)
[facility]  PATIENT: Anna Lyons DOB: 02/22/1955   REASON FOR VISIT: Follow-up for history of stroke in 2013, seizure disorder , mild cognitive impairment long history of depression HISTORY FROM: Patient    HISTORY OF PRESENT ILLNESS:Anna Lyons is a 63 year old female with a history of stroke. She returns today for follow-up. The patient continues on aspirin for stroke prevention. She denies any significant bruising or bleeding. The patient's blood pressure and cholesterol has been managed by her primary care provider. Her blood pressure is in good control today. The patient continues to use baclofen for spasticity and she reports that this is working well. Her primary care provides her with Klonopin to help with restless leg symptoms, she was most recently  started on Mirapex in addition to the [place]. Patient reports that since the last visit she had a fall. This occurred in September. She states that she does not recall any events surrounding the fall other than that she was going to the bathroom and apparently  fell and hit her head. She did call her daughter who came and took her to the emergency room. Patient states that she has  trouble with her balance and she did not use her walker when she went to the bathroom. She states that in the emergency room she was diagnosed with a concussion and had a hematoma from the fall. Since then the patient has continued to have a headache daily. She is also very sleepy throughout the day. She denies any other symptoms. She denies any seizure events. She is continued to take Keppra 250 mg twice a day. After her fall she denies biting her tongue or loss of bowels or bladder. She returns today for an evaluation. Update 05/14/2014 PS: She returns for follow-up after last visit 6 months ago. She is a complaint bad daughter. She remains in a logical stable without recurrent stroke or TIA symptoms. She states her blood pressure is under good  control. Last lipid profile checked several months ago was fine. She remains on aspirin which is tolerating well without bleeding, bruising. She has not had any seizures and she left the hospital. At last visit the [place] dose was reduced and she is tolerating well without breakthrough seizures. She remains on baclofen 20 mg 3 times daily which seems to help her spasticity and she is tolerating it well. She has noted increase in restless leg symptoms and she takes Klonopin 1 mg at night which seems to help. She has tried Requip in the past which has not work for her. She was recently changed from Zoloft to Prozac for depression and it seems to be helping. She has not had any carotid Dopplers done for nearly a year. She is able to walk with a wheeled walker and has had no recent falls.  Update 05/26/2015 : She returns for follow-up after last visit 1 year ago with nurse practitioner. Patient has a new complaint of worsening of her feet paresthesias particularly since she started a new part-time job in which she has to stay on her feet for 4 hours. She reports significant burning of her toes as well as the soles mostly in the right leg but the left leg also to lesser degree. She does have a diagnosis of restless leg syndrome and takes Klonopin and Mirapex at night but has not yet tried taking either medication during the daytime. She does admit to her depression be not adequately treated. She has been on Cymbalta 60 mg daily  since the death of her husband 3 years ago. She has not had any falls or injuries. She has been taking baclofen 20 mg 3 times daily for her post stroke spasticity and walking difficulty and is tolerating it well without any side effects. She is worried that she may have peripheral neuropathy and has not had nerve conduction evaluation for that Update 03/29/2016 PS; she returns for follow-up after last visit 10 months ago. She is doing well without recurrent stroke or TIA symptoms since November  2013. She states she had a possible TIA last year and was seen at: [facility] and went home. She complains of decreased energy and poor stamina. Memory is also been quite poor. Patient was previously on Cymbalta for depression and previously had failed Zoloft. She was recently switched to Wellbutrin 150 mg twice daily but she feels her depression is still not adequately treated. She has not been working. She is quite emotional. She cries easily. She has been managed for depression by her primary care physician Dr. Sharlet Salina. She has not seen a psychiatrist. Patient continued to tolerate Keppra well and has not had any breakthrough seizures this year. She states her blood pressure is well controlled today it is 125/60. She is tolerating Lipitor well without muscle aches or pains. She complains of some burning in her feet and his seeing a foot doctor for that. She did wear her orthotic implant but that does not seem to affect her walking. She has had nerve conduction EMG done in the past by Dr. Charlestine Night which was apparently not suggestive of neuropathy. She recently had surgery in the left hand for trigger finger. UPDATE 1/17/2019CM Anna Lyons, 63 year old female returns for follow-up with history of stroke in November 2013.  She is currently on aspirin and Lipitor for secondary stroke prevention without further stroke or TIA symptoms.  She has minimal bruising and no bleeding she continues to complain with memory issues however she continues to work full-time and has not had problems with her job.  She has a long history of depression but has never seen psychiatry.  She is currently on Lexapro.  Patient also has seizure disorder and is on Keppra 500 twice daily without any seizure activity since last seen.  She denies any side effects to the medication.  EMG nerve conduction in the past not suggestive of neuropathy.  She says she occasionally has balance issues and uses her walker.  She does not have an assistive device  today.  She says she drives without difficulty.  Blood pressure in the office today 101/66.  She was encouraged to stay well-hydrated.  She is on baclofen for spasticity and Mirapex for restless legs through  primary care provider.  She returns for reevaluation Update 10/24/2018: She returns for follow-up after last visit with the Cecille Rubin, nurse practitioner on 02/17/2017.  She is accompanied by her daughter.  Patient was recently admitted to [facility] on 10/11/2018 with sudden onset of slurred speech as well as unsteady gait and slight right-sided weakness.  CT scan of the brain and neck did not show significant large vessel stenosis or occlusion and MRI scan was negative for acute stroke but showed old left basal ganglia infarct.  Patient had been under significant stress because of a death in the family.  2D echo showed normal ejection fraction.  EEG was normal without epileptiform activity.  LDL cholesterol was 39 mg percent and hemoglobin A1c was 5.6.  Patient was on aspirin at home and  Plavix was added for 3 weeks which she is taking and tolerating well without bruising or bleeding.  She states her speech has recovered back to normal however she is complaining of increased paresthesias in the right leg from below the knee as well as she has some baseline restless leg syndromes with paresthesias in both feet at night.  She currently takes gabapentin 100 mg 3 times daily but feels it is no longer working as well and her feet are much colder and she has a burning feeling.  She denies sleeping on her side or habitual sitting crosslegged.  She states her blood pressure is well controlled.  She is tolerating Lipitor well without any side effects.  She states she has had only one seizure earlier this year but is tolerating Keppra 500 twice daily well without any side effects. REVIEW OF SYSTEMS: Full 14 system review of systems performed and notable only for foot numbness, tingling, leg weakness, speech  difficulty, all others are neg:    ALLERGIES: Allergies  Allergen Reactions   Codeine Itching    HOME MEDICATIONS: Outpatient Medications Prior to Visit  Medication Sig Dispense Refill   albuterol (PROVENTIL HFA;VENTOLIN HFA) 108 (90 Base) MCG/ACT inhaler Inhale 2 puffs into the lungs every 6 (six) hours as needed for wheezing or shortness of breath. 1 Inhaler 0   aspirin EC 81 MG EC tablet Take 1 tablet (81 mg total) by mouth daily for 21 days. Take with plavix for 3 weeks, then plavix alone. 21 tablet 0   atorvastatin (LIPITOR) 80 MG tablet Take 1 tablet (80 mg total) by mouth at bedtime. 90 tablet 3   baclofen (LIORESAL) 20 MG tablet TAKE 1/2 (ONE-HALF) TABLET BY MOUTH THREE TIMES DAILY FOR 7 DAYS AND THEN  1 TABLET THREE TIMES DAILY (Patient taking differently: Take 20 mg by mouth 3 (three) times daily. ) 90 each 3   clopidogrel (PLAVIX) 75 MG tablet Take 1 tablet (75 mg total) by mouth daily. 30 tablet 4   hydrochlorothiazide (MICROZIDE) 12.5 MG capsule Take 1 capsule (12.5 mg total) by mouth daily. 90 capsule 3   levETIRAcetam (KEPPRA) 500 MG tablet Take 1 tablet (500 mg total) by mouth 2 (two) times daily. 180 tablet 1   Multiple Vitamin (MULTIVITAMIN WITH MINERALS) TABS tablet Take 1 tablet by mouth daily.     pramipexole (MIRAPEX) 0.5 MG tablet Take 1 tablet (0.5 mg total) by mouth at bedtime as needed. 90 tablet 1   temazepam (RESTORIL) 15 MG capsule Take 1-2 capsules (15-30 mg total) by mouth at bedtime as needed for sleep. 60 capsule 2   Vilazodone HCl (VIIBRYD) 40 MG TABS Take 1 tablet (40 mg total) by mouth daily. 90 tablet 1   gabapentin (NEURONTIN) 100 MG capsule Take 1 capsule (100 mg total) by mouth 3 (three) times daily. 90 capsule 3   ibuprofen (ADVIL,MOTRIN) 800 MG tablet Take 1 tablet by mouth every 8 (eight) hours as needed for moderate pain.   1   No facility-administered medications prior to visit.     PAST MEDICAL HISTORY: Past Medical History:    Diagnosis Date   Angina    Breast cyst    Cancer ([place])    Depression    Hypertension    RLS (restless legs syndrome) 11/16/2011   Seizures ([place]) 2016   last seizure="last year, 2016" per pt.   Stroke ([place]) 04/20/2011   a. 04/20/11 Left subcortical infarct treated w/ TPA;  b. 04/2011 Echo: EF 55-60%;  c. 04/2011 Normal Carotid u/s  d. Residual "walk w/limp on right; unable to grasp w/right hand"   TIA (transient ischemic attack) 08/2010   Tobacco abuse    a. quit @ time of CVA 04/2011.    PAST SURGICAL HISTORY: Past Surgical History:  Procedure Laterality Date   BREAST RECONSTRUCTION     bilaterally   CARPAL TUNNEL RELEASE Left 1990's   CARPAL TUNNEL RELEASE Left 12/08/2015   CESAREAN SECTION  1979   KNEE ARTHROSCOPY Left 1990's    cartilage repair   LEFT HEART CATHETERIZATION WITH CORONARY ANGIOGRAM N/A 05/20/2011   Procedure: LEFT HEART CATHETERIZATION WITH CORONARY ANGIOGRAM;  Surgeon: Josue Hector, MD;  Location: [facility] CATH LAB;  Service: Cardiovascular;  Laterality: N/A;   MASTECTOMY Bilateral 1980's   bilateral with reconstruction    FAMILY HISTORY: Family History  Problem Relation Age of Onset   Lupus Mother        died early 44's.   Emphysema Father        died late 14's.   COPD Father    Pancreatic cancer Brother    Bladder Cancer Brother    Rectal cancer Brother    Suicidality Brother    Colon cancer Neg Hx     SOCIAL HISTORY: Social History   Socioeconomic History   Marital status: Widowed    Spouse name: Not on file   Number of children: 1   Years of education: 12   Highest education level: Not on file  Occupational History   Occupation: disabled  Social Designer, fashion/clothing strain: Not on file   Food insecurity    Worry: Not on file    Inability: Not on file   Transportation needs    Medical: Not on file    Non-medical: Not on file  Tobacco Use   Smoking status: Current Every Day Smoker    Packs/day:  0.25    Years: 37.00    Pack years: 9.25    Types: Cigarettes   Smokeless tobacco: Never Used  Substance and Sexual Activity   Alcohol use: Yes    Alcohol/week: 7.0 standard drinks    Types: 7 Glasses of wine per week    Comment: very occasional   Drug use: No   Sexual activity: Not Currently  Lifestyle   Physical activity    Days per week: Not on file    Minutes per session: Not on file   Stress: Not on file  Relationships   Social connections    Talks on phone: Not on file    Gets together: Not on file    Attends religious service: Not on file    Active member of club or organization: Not on file    Attends meetings of clubs or organizations: Not on file    Relationship status: Not on file   Intimate partner violence    Fear of current or ex partner: Not on file    Emotionally abused: Not on file    Physically abused: Not on file    Forced sexual activity: Not on file  Other Topics Concern   Not on file  Social History Narrative   Pt lives alone.   Caffeine Use: 1-3 cups of caffeine daily (tea)     PHYSICAL EXAM  Vitals:   10/24/18 0848  BP: 129/80  Pulse: 70  Temp: (!) 96 F (35.6 C)  Weight: 62.1 kg   Body mass index is 25.02 kg/m.  Generalized: Pleasant middle-aged Caucasian  lady not in distress,  Head: normocephalic and atraumatic,.    Neck: Supple,   Musculoskeletal: No deformity   Neurological examination   Mentation: Alert oriented to time, place, history taking.  Slightly diminished attention span short-term memory   Follows all commands speech and language fluent.   Cranial nerve II-XII: Pupils were equal round reactive to light extraocular movements were full, visual field were full on confrontational test. Facial sensation and strength were normal. hearing was intact to finger rubbing bilaterally. Uvula tongue midline. head turning and shoulder shrug were normal and symmetric.Tongue protrusion into cheek strength was normal. Motor:  normal bulk and tone, full strength in the BUE, BLE, mild right grip weakness.  Diminished fine finger movements on the right.  Orbits left over right upper extremity.  Drags right foot while walking slightly increased tone in the right foot. Sensory: Diminished touch and pinprick and vibratory in both feet over the toes only .  Diminished touch pinprick sensation in the right leg from the knee down.  Positive Tinel sign over the right fibular head. Coordination: finger-nose-finger, heel-to-shin bilaterally, no dysmetria Reflexes: Symmetric upper and lower except diminished right ankle jerk, plantar responses were flexor bilaterally. Gait and Station: Able to rise from the chair without assistance.  Walks with dragging the right leg slightly.  Slightly unsteady while standing on toes and heels.  Unable to walk tandem without difficulty  DIAGNOSTIC DATA (LABS, IMAGING, TESTING) - I reviewed patient records, labs, notes, testing and imaging myself where available.  Lab Results  Component Value Date   WBC 4.8 10/11/2018   HGB 14.6 10/11/2018   HCT 43.0 10/11/2018   MCV 97.1 10/11/2018   PLT 183 10/11/2018      Component Value Date/Time   NA 133 (L) 10/11/2018 1634   K 3.8 10/11/2018 1634   CL 95 (L) 10/11/2018 1634   CO2 27 10/11/2018 1617   GLUCOSE 110 (H) 10/11/2018 1634   BUN 10 10/11/2018 1634   CREATININE 0.60 10/11/2018 1634   CALCIUM 9.1 10/11/2018 1617   PROT 6.7 10/11/2018 1617   ALBUMIN 3.6 10/11/2018 1617   AST 27 10/11/2018 1617   ALT 21 10/11/2018 1617   ALKPHOS 66 10/11/2018 1617   BILITOT 0.3 10/11/2018 1617   GFRNONAA >60 10/11/2018 1617   GFRAA >60 10/11/2018 1617    Lab Results  Component Value Date   HGBA1C 5.3 10/12/2018   Lab Results  Component Value Date   [id number] (H) 09/27/2018   Lab Results  Component Value Date   TSH 1.90 09/27/2018      ASSESSMENT AND PLAN  63 y.o. year old female  has a past medical history of Angina,  Depression,  Hypertension, RLS (restless legs syndrome) (11/16/2011), Seizures ([place]) (2016), Stroke ([place]) (04/20/2011), TIA (transient ischemic attack) (08/2010), recent episode of transient slurred speech and right leg weakness possibly left brain TIA from small vessel disease in September 2020.  Recent worsening of right lower extremity paresthesias possibly peroneal neuropathy versus underlying restless leg syndrome.  Remote history of seizures which appear well-controlled on Keppra   PLAN: I had a long discussion with the patient  And her daughterregarding right foot numbness which may be from peroneal neuropathy as well as her prior history of restless leg syndrome.  I recommend increasing gabapentin dose to 200 mg 3 times daily if tolerated.  Check EMG nerve conduction study.  Continue aspirin and Plavix till October 2 given recent TIA and then discontinue Plavix and stay on  aspirin alone for stroke prevention.  Maintain strict control of hypertension with blood pressure goal below 130/90, lipids with LDL cholesterol goal below 70 mg percent and diabetes with hemoglobin A1c goal below 6.5%.  She was encouraged to eat a healthy diet with lots of fruits vegetables cereals and whole grains and to exercise and not gain weight.  Continue Keppra 500 mg twice daily for seizures which appear to be stable and well controlled.  She will return for follow-up in the future in 6 months with my nurse practitioner Janett Billow or call earlier if necessary.needed I spent 25 minutes in total face to face time with the patient more than 50% of which was spent counseling and coordination of care, reviewing test results reviewing medications and discussing and reviewing the diagnosis of stroke and management of risk factors, seizure disorder and avoidance of seizure triggers long-term treatment of depression and importance of staying on therapy. Antony Contras, MD  [facility] [address], [place], [place]  [address] [phone]

## 2018-10-28 ENCOUNTER — Other Ambulatory Visit: Payer: Medicare Other

## 2018-10-31 ENCOUNTER — Other Ambulatory Visit: Payer: Self-pay | Admitting: Internal Medicine

## 2018-10-31 MED ORDER — BACLOFEN 20 MG PO TABS: 20.0000 mg | ORAL_TABLET | Freq: Three times a day (TID) | ORAL | 2 refills | Status: DC

## 2018-10-31 NOTE — Telephone Encounter (Signed)
baclofen (LIORESAL) 20 MG tablet  [facility] [address] [facility] [place], [place] [phone] (Phone) [phone] (Fax)   pls refill

## 2018-10-31 NOTE — Telephone Encounter (Signed)
Requested medication (s) are due for refill today: yes  Requested medication (s) are on the active medication list: yes  Last refill:  05/01/2018  Future visit scheduled: no  Notes to clinic:  Refill cannot be delegated    Requested Prescriptions  Pending Prescriptions Disp Refills   baclofen (LIORESAL) 20 MG tablet      Sig: Take 1 tablet (20 mg total) by mouth 3 (three) times daily.     Not Delegated - Analgesics:  Muscle Relaxants Failed - 10/31/2018  9:04 AM      Failed - This refill cannot be delegated      Passed - Valid encounter within last 6 months    Recent Outpatient Visits          1 month ago Numbness of right foot   [place], MD   3 months ago Atypical chest pain   [place], [place], MD   6 months ago Pain of left breast   [place], MD   8 months ago Cough   [facility] -Mayer Camel, MD   1 year ago Need for influenza vaccination   [facility] -Chuck Hint, MD

## 2018-11-19 ENCOUNTER — Other Ambulatory Visit: Payer: Self-pay | Admitting: Internal Medicine

## 2018-11-30 ENCOUNTER — Other Ambulatory Visit: Payer: Self-pay

## 2018-11-30 ENCOUNTER — Ambulatory Visit: Payer: Self-pay

## 2018-11-30 ENCOUNTER — Emergency Department (HOSPITAL_COMMUNITY)
Admission: EM | Admit: 2018-11-30 | Discharge: 2018-12-01 | Disposition: A | Discharge status: Home / Self Care | Payer: Medicare Other | Attending: Emergency Medicine | Admitting: Emergency Medicine

## 2018-11-30 ENCOUNTER — Encounter (HOSPITAL_COMMUNITY): Payer: Self-pay

## 2018-11-30 DIAGNOSIS — I10 Essential (primary) hypertension: Secondary | ICD-10-CM | POA: Diagnosis not present

## 2018-11-30 DIAGNOSIS — R42 Dizziness and giddiness: Secondary | ICD-10-CM

## 2018-11-30 DIAGNOSIS — D649 Anemia, unspecified: Secondary | ICD-10-CM | POA: Diagnosis not present

## 2018-11-30 DIAGNOSIS — Z79899 Other long term (current) drug therapy: Secondary | ICD-10-CM | POA: Diagnosis not present

## 2018-11-30 DIAGNOSIS — Z7982 Long term (current) use of aspirin: Secondary | ICD-10-CM | POA: Diagnosis not present

## 2018-11-30 DIAGNOSIS — F17210 Nicotine dependence, cigarettes, uncomplicated: Secondary | ICD-10-CM | POA: Diagnosis not present

## 2018-11-30 DIAGNOSIS — E871 Hypo-osmolality and hyponatremia: Secondary | ICD-10-CM | POA: Diagnosis not present

## 2018-11-30 DIAGNOSIS — R112 Nausea with vomiting, unspecified: Secondary | ICD-10-CM | POA: Diagnosis not present

## 2018-11-30 DIAGNOSIS — I951 Orthostatic hypotension: Secondary | ICD-10-CM | POA: Diagnosis not present

## 2018-11-30 LAB — CBC
HCT: 33.7 % — ABNORMAL LOW (ref 36.0–46.0)
Hemoglobin: 11.4 g/dL — ABNORMAL LOW (ref 12.0–15.0)
MCH: 32.7 pg (ref 26.0–34.0)
MCHC: 33.8 g/dL (ref 30.0–36.0)
MCV: 96.6 fL (ref 80.0–100.0)
Platelets: 199 10*3/uL (ref 150–400)
RBC: 3.49 MIL/uL — ABNORMAL LOW (ref 3.87–5.11)
RDW: 12.4 % (ref 11.5–15.5)
WBC: 5 10*3/uL (ref 4.0–10.5)
nRBC: 0 % (ref 0.0–0.2)

## 2018-11-30 LAB — COMPREHENSIVE METABOLIC PANEL
ALT: 20 U/L (ref 0–44)
AST: 24 U/L (ref 15–41)
Albumin: 3.7 g/dL (ref 3.5–5.0)
Alkaline Phosphatase: 67 U/L (ref 38–126)
Anion gap: 11 (ref 5–15)
BUN: 14 mg/dL (ref 8–23)
CO2: 24 mmol/L (ref 22–32)
Calcium: 9.2 mg/dL (ref 8.9–10.3)
Chloride: 94 mmol/L — ABNORMAL LOW (ref 98–111)
Creatinine, Ser: 0.94 mg/dL (ref 0.44–1.00)
GFR calc Af Amer: >60 mL/min (ref >60)
GFR calc non Af Amer: >60 mL/min (ref >60)
Glucose, Bld: 103 mg/dL — ABNORMAL HIGH (ref 70–99)
Potassium: 3.8 mmol/L (ref 3.5–5.1)
Sodium: 129 mmol/L — ABNORMAL LOW (ref 135–145)
Total Bilirubin: 0.8 mg/dL (ref 0.3–1.2)
Total Protein: 6.6 g/dL (ref 6.5–8.1)

## 2018-11-30 LAB — LIPASE, BLOOD: Lipase: 39 U/L (ref 11–51)

## 2018-11-30 LAB — URINALYSIS, ROUTINE W REFLEX MICROSCOPIC
Bacteria, UA: NONE SEEN
Bilirubin Urine: NEGATIVE
Glucose, UA: NEGATIVE mg/dL
Hgb urine dipstick: NEGATIVE
Ketones, ur: NEGATIVE mg/dL
Nitrite: NEGATIVE
Protein, ur: NEGATIVE mg/dL
Specific Gravity, Urine: 1.015 (ref 1.005–1.030)
pH: 6 (ref 5.0–8.0)

## 2018-11-30 LAB — CBG MONITORING, ED: Glucose-Capillary: 103 mg/dL — ABNORMAL HIGH (ref 70–99)

## 2018-11-30 MED ORDER — ONDANSETRON 4 MG PO TBDP
4.0000 mg | ORAL_TABLET | Freq: Once | ORAL | Status: AC
  Administered 2018-11-30: 4 mg via ORAL
  Filled 2018-11-30: qty 1

## 2018-11-30 MED ORDER — SODIUM CHLORIDE 0.9% FLUSH: 3.0000 mL | Freq: Once | INTRAVENOUS | Status: DC

## 2018-11-30 NOTE — Telephone Encounter (Signed)
Incoming call from Pt.  With complaint of feeling dizziy and stomach pain. Rated moderate to severe.  Onset was Tuesday night.   Standing up aggravates the dizziness.  No other Sx. Noted.  Reviewed protocol with Pt.  Recommended that Pt go to Urgent care for further evaluation.    Pt states that she at work and will go to Urgent Care after work.        Reason for Disposition . SEVERE dizziness (e.g., unable to stand, requires support to walk, feels like passing out now)  Answer Assessment - Initial Assessment Questions 1. DESCRIPTION: "Describe your dizziness."     *No Answer* 2. LIGHTHEADED: "Do you feel lightheaded?" (e.g., somewhat faint, woozy, weak upon standing)     Feel faint 3. VERTIGO: "Do you feel like either you or the room is spinning or tilting?" (i.e. vertigo)     *No Answer* 4. SEVERITY: "How bad is it?"  "Do you feel like you are going to faint?" "Can you stand and walk?"   - MILD - walking normally   - MODERATE - interferes with normal activities (e.g., work, school)    - SEVERE - unable to stand, requires support to walk, feels like passing out now.      Moderate to severe 5. ONSET:  "When did the dizziness begin?"     Tuesday night 6. AGGRAVATING FACTORS: "Does anything make it worse?" (e.g., standing, change in head position)    Standing up.  7. HEART RATE: "Can you tell me your heart rate?" "How many beats in 15 seconds?"  (Note: not all patients can do this)      Unable to assess 8. CAUSE: "What do you think is causing the dizziness?"     *No Answer* 9. RECURRENT SYMPTOM: "Have you had dizziness before?" If so, ask: "When was the last time?" "What happened that time?"     *No Answer* 10. OTHER SYMPTOMS: "Do you have any other symptoms?" (e.g., fever, chest pain, vomiting, diarrhea, bleeding)        11. PREGNANCY: "Is there any chance you are pregnant?" "When was your last menstrual period?"       na  Protocols used: DIZZINESS [facility]

## 2018-11-30 NOTE — ED Triage Notes (Signed)
Pt reports nausea and dizziness for the past 2 days. Pt a.o, nad noted.

## 2018-11-30 NOTE — Telephone Encounter (Signed)
Patient daughter called to say her mother called the office today and was told to go to [facility] for treatment of lightheadedness and vomiting and Headache.  No fever or cold symptoms. No loss of taste or smell. She states they will not accept her mother at [facility]. Daughter was advised to take her mother to ER for treatment. [facility] [place] triage nurse was advised per protocol because of possible COVID-19 symptoms.  Reason for Disposition . General information question, no triage required and triager able to answer question  Answer Assessment - Initial Assessment Questions 1. REASON FOR CALL or QUESTION: "What is your reason for calling today?" or "How can I best help you?" or "What question do you have that I can help answer?"     Patient daughter called to say her mother called the office today and was told to go to [facility] for treatment of lightheadedness and vomiting and Headache. She states they will not accept her mother at [facility]. Daughter was advised to take her mother to ER for treatment.  Protocols used: INFORMATION ONLY CALL - NO TRIAGE-A-AH

## 2018-12-01 LAB — POC OCCULT BLOOD, ED: Fecal Occult Bld: NEGATIVE

## 2018-12-01 MED ORDER — SODIUM CHLORIDE 0.9 % IV BOLUS
1000.0000 mL | Freq: Once | INTRAVENOUS | Status: AC
  Administered 2018-12-01: 1000 mL via INTRAVENOUS

## 2018-12-01 MED ORDER — ONDANSETRON 4 MG PO TBDP
8.0000 mg | ORAL_TABLET | Freq: Once | ORAL | Status: AC
  Administered 2018-12-01: 8 mg via ORAL
  Filled 2018-12-01: qty 2

## 2018-12-01 MED ORDER — IBUPROFEN 400 MG PO TABS
400.0000 mg | ORAL_TABLET | Freq: Once | ORAL | Status: AC
  Administered 2018-12-01: 400 mg via ORAL
  Filled 2018-12-01: qty 1

## 2018-12-01 MED ORDER — ONDANSETRON HCL 4 MG PO TABS: 4.0000 mg | ORAL_TABLET | Freq: Four times a day (QID) | ORAL | 0 refills | Status: DC | PRN

## 2018-12-01 NOTE — Discharge Instructions (Addendum)
Drink plenty of fluids.  Return if symptoms are getting worse. 

## 2018-12-01 NOTE — ED Notes (Signed)
Patient verbalizes understanding of discharge instructions. Opportunity for questioning and answers were provided. Armband removed by staff, pt discharged from ED. Pt. ambulatory and discharged home.  

## 2018-12-01 NOTE — ED Provider Notes (Signed)
[facility] EMERGENCY DEPARTMENT Provider Note   CSN: [id number] Arrival date & time: 11/30/18  1725    History   Chief Complaint Chief Complaint  Patient presents with  . Dizziness  . Nausea    HPI CHRISSA Lyons is a 63 y.o. female.   The history is provided by the patient.  She has history of hypertension, stroke and comes in because of nausea and dizziness.  For the last 2 days, she has had nausea and has vomited on a few occasions.  She also notices that she gets dizzy which is worse when she stands up.  Dizziness is a near syncopal sensation.  She denies chest pain, heaviness, tightness, pressure.  She denies fever or chills.  She has had similar episodes in the past and have been possibly due to dehydration.  She denies fever, chills, sweats.  Denies diarrhea.  She denies blood in emesis and denies any dark bowel movements.  Past Medical History:  Diagnosis Date  . Angina   . Breast cyst   . Cancer ([place])   . Depression   . Hypertension   . RLS (restless legs syndrome) 11/16/2011  . Seizures ([place]) 2016   last seizure="last year, 2016" per pt.  . Stroke ([place]) 04/20/2011   a. 04/20/11 Left subcortical infarct treated w/ TPA;  b. 04/2011 Echo: EF 55-60%;  c. 04/2011 Normal Carotid u/s  d. Residual "walk w/limp on right; unable to grasp w/right hand"  . TIA (transient ischemic attack) 08/2010  . Tobacco abuse    a. quit @ time of CVA 04/2011.    Patient Active Problem List   Diagnosis Date Noted  . Aphasia   . CVA (cerebral vascular accident) ([place]) 10/11/2018  . Numbness of right foot 09/27/2018  . Atypical chest pain 07/19/2018  . Acute respiratory distress 07/19/2018  . Epigastric abdominal tenderness without rebound tenderness 02/13/2018  . PTSD (post-traumatic stress disorder) 04/01/2017  . History of stroke 02/17/2017  . Malaise and fatigue 11/02/2016  . Eye problem 05/04/2016  . Cough 03/04/2016  . Abdominal pain, epigastric 10/07/2015  .  Paresthesia of both feet 03/09/2015  . Routine general medical examination at a health care facility 11/15/2014  . Breast pain in female 12/21/2013  . Dizziness 08/16/2013  . TIA (transient ischemic attack) 08/16/2013  . Adhesive capsulitis of right shoulder 06/18/2013  . Hemiparesis affecting right side as late effect of cerebrovascular accident ([place]) 06/18/2013  . Seizure disorder ([place]) 05/19/2012  . NSTEMI, initial episode of care; Type 2 05/19/2012  . Anemia 11/17/2011  . Hypokalemia 11/16/2011  . RLS (restless legs syndrome) 11/16/2011  . Abnormal ECG 05/19/2011  . Essential hypertension, benign 05/19/2011  . Tobacco abuse 05/19/2011  . CVA (cerebral infarction) 04/23/2011  . Major depression, recurrent ([place])     Past Surgical History:  Procedure Laterality Date  . BREAST RECONSTRUCTION     bilaterally  . CARPAL TUNNEL RELEASE Left 1990's  . CARPAL TUNNEL RELEASE Left 12/08/2015  . CESAREAN SECTION  1979  . KNEE ARTHROSCOPY Left 1990's    cartilage repair  . LEFT HEART CATHETERIZATION WITH CORONARY ANGIOGRAM N/A 05/20/2011   Procedure: LEFT HEART CATHETERIZATION WITH CORONARY ANGIOGRAM;  Surgeon: Josue Hector, MD;  Location: [facility] CATH LAB;  Service: Cardiovascular;  Laterality: N/A;  . MASTECTOMY Bilateral 1980's   bilateral with reconstruction     OB History   No obstetric history on file.      Home Medications    Prior  to Admission medications   Medication Sig Start Date End Date Taking? Authorizing Provider  albuterol (PROVENTIL HFA;VENTOLIN HFA) 108 (90 Base) MCG/ACT inhaler Inhale 2 puffs into the lungs every 6 (six) hours as needed for wheezing or shortness of breath. 12/28/16  Yes Hoyt Koch, MD  aspirin EC 81 MG tablet Take 81 mg by mouth daily.   Yes [provider]  atorvastatin (LIPITOR) 80 MG tablet Take 1 tablet (80 mg total) by mouth at bedtime. 10/12/18  Yes Rai, Ripudeep K, MD  baclofen (LIORESAL) 20 MG tablet Take 1 tablet (20 mg  total) by mouth 3 (three) times daily. 10/31/18  Yes Hoyt Koch, MD  clopidogrel (PLAVIX) 75 MG tablet Take 1 tablet (75 mg total) by mouth daily. 10/13/18  Yes Rai, Ripudeep K, MD  gabapentin (NEURONTIN) 100 MG capsule Take 2 capsules (200 mg total) by mouth 3 (three) times daily. Patient taking differently: Take 100 mg by mouth 3 (three) times daily.  10/24/18  Yes Garvin Fila, MD  hydrochlorothiazide (MICROZIDE) 12.5 MG capsule Take 1 capsule (12.5 mg total) by mouth daily. 10/17/18  Yes Hoyt Koch, MD  levETIRAcetam (KEPPRA) 500 MG tablet Take 1 tablet (500 mg total) by mouth 2 (two) times daily. 10/17/18  Yes Hoyt Koch, MD  Multiple Vitamin (MULTIVITAMIN WITH MINERALS) TABS tablet Take 1 tablet by mouth daily.   Yes [provider]  pramipexole (MIRAPEX) 0.5 MG tablet Take 1 tablet (0.5 mg total) by mouth at bedtime as needed. Patient taking differently: Take 0.5 mg by mouth at bedtime as needed (for sleep).  10/17/18  Yes Hoyt Koch, MD  temazepam (RESTORIL) 15 MG capsule TAKE 1-2 AT BEDTIME AS NEEDED FOR SLEEP Patient taking differently: Take 15 mg by mouth at bedtime as needed for sleep.  11/21/18  Yes Hoyt Koch, MD  Vilazodone HCl (VIIBRYD) 40 MG TABS Take 1 tablet (40 mg total) by mouth daily. 10/17/18  Yes Hoyt Koch, MD    Family History Family History  Problem Relation Age of Onset  . Lupus Mother        died early 24's.  . Emphysema Father        died late 43's.  [name] COPD Father   . Pancreatic cancer Brother   . Bladder Cancer Brother   . Rectal cancer Brother   . Suicidality Brother   . Colon cancer Neg Hx     Social History Social History   Tobacco Use  . Smoking status: Current Every Day Smoker    Packs/day: 0.25    Years: 37.00    Pack years: 9.25    Types: Cigarettes  . Smokeless tobacco: Never Used  Substance Use Topics  . Alcohol use: Yes    Alcohol/week: 7.0 standard drinks     Types: 7 Glasses of wine per week    Comment: very occasional  . Drug use: No     Allergies   Codeine   Review of Systems Review of Systems  All other systems reviewed and are negative.    Physical Exam Updated Vital Signs BP 140/78   Pulse 66   Temp 97.9 F (36.6 C) (Oral)   Resp 17   Ht 5\' 2"  (1.575 m)   Wt 63 kg   SpO2 93%   BMI 25.42 kg/m   Physical Exam Vitals signs and nursing note reviewed.    63 year old female, resting comfortably and in no acute distress. Vital signs are  normal. Oxygen saturation is 93%, which is normal. Head is normocephalic and atraumatic. PERRLA, EOMI. Oropharynx is clear. Neck is nontender and supple without adenopathy or JVD. Back is nontender and there is no CVA tenderness. Lungs are clear without rales, wheezes, or rhonchi. Chest is nontender. Heart has regular rate and rhythm without murmur. Abdomen is soft, flat, nontender without masses or hepatosplenomegaly and peristalsis is normoactive. Rectal: Normal sphincter tone.  Small amount of brown stool present which is Hemoccult negative. Extremities have no cyanosis or edema, full range of motion is present. Skin is warm and dry without rash. Neurologic: Mental status is normal, cranial nerves are intact, there is mild right hemiparesis.  There is no nystagmus.  Dizziness is not reproduced with passive head movement.  ED Treatments / Results  Labs (all labs ordered are listed, but only abnormal results are displayed) Labs Reviewed  CBC - Abnormal; Notable for the following components:      Result Value   RBC 3.49 (*)    Hemoglobin 11.4 (*)    HCT 33.7 (*)    All other components within normal limits  URINALYSIS, ROUTINE W REFLEX MICROSCOPIC - Abnormal; Notable for the following components:   Leukocytes,Ua SMALL (*)    All other components within normal limits  COMPREHENSIVE METABOLIC PANEL - Abnormal; Notable for the following components:   Sodium 129 (*)    Chloride 94  (*)    Glucose, Bld 103 (*)    All other components within normal limits  CBG MONITORING, ED - Abnormal; Notable for the following components:   Glucose-Capillary 103 (*)    All other components within normal limits  LIPASE, BLOOD  POC OCCULT BLOOD, ED    EKG EKG Interpretation  Date/Time:  Thursday November 30 2018 17:32:37 EDT Ventricular Rate:  60 PR Interval:  172 QRS Duration: 82 QT Interval:  442 QTC Calculation: 442 R Axis:   -63 Text Interpretation: Normal sinus rhythm Left anterior fascicular block Abnormal ECG When compared with ECG of 10/04/2018, Arm lead reversal has been corrected Confirmed by Delora Fuel ([id number]) on 11/30/2018 11:23:14 PM  Procedures Procedures   Medications Ordered in ED Medications  sodium chloride flush (NS) 0.9 % injection 3 mL (has no administration in time range)  ondansetron (ZOFRAN-ODT) disintegrating tablet 8 mg (has no administration in time range)  ondansetron (ZOFRAN-ODT) disintegrating tablet 4 mg (4 mg Oral Given 11/30/18 1737)     Initial Impression / Assessment and Plan / ED Course  I have reviewed the triage vital signs and the nursing notes.  Pertinent labs & imaging results that were available during my care of the patient were reviewed by me and considered in my medical decision making (see chart for details).  Dizziness and nausea of uncertain cause.  Vital signs are normal.  Symptoms are orthostatic so we will check orthostatic vital signs.  Labs show mild anemia which is new.  Hemoglobin is 11.4 compared with 12.7 on September 9 and 13.0 on August 26.  However, no evidence of GI blood loss, stool Hemoccult negative.  Orthostatic vital signs do show significant drop in blood pressure.  She will be given IV fluids.  She is given a dose of ondansetron with significant improvement in nausea.  She is given IV fluids.  Following that, she feels much better when standing.  She will be discharged with prescription for ondansetron.   Instructed to increase fluid intake.  Final Clinical Impressions(s) / ED Diagnoses   Final diagnoses:  Orthostatic  dizziness  Non-intractable vomiting with nausea, unspecified vomiting type  Normochromic normocytic anemia  Hyponatremia    ED Discharge Orders         Ordered    ondansetron (ZOFRAN) 4 MG tablet  Every 6 hours PRN     12/01/18 0000000           Delora Fuel, MD [id number] [phone]

## 2018-12-01 NOTE — Telephone Encounter (Signed)
Went to Ed

## 2018-12-12 ENCOUNTER — Telehealth: Payer: Self-pay | Admitting: Neurology

## 2018-12-12 ENCOUNTER — Telehealth (HOSPITAL_COMMUNITY): Payer: Self-pay | Admitting: Neurology

## 2018-12-12 MED ORDER — TOPIRAMATE 25 MG PO TABS: 25.0000 mg | ORAL_TABLET | Freq: Two times a day (BID) | ORAL | 2 refills | Status: DC

## 2018-12-12 NOTE — Telephone Encounter (Signed)
I called the patient and left a message for her to call me back to discuss pain medication for her neuropathy.

## 2018-12-12 NOTE — Telephone Encounter (Signed)
The patient called me back and stated she had noticed increase in paresthesias and discomfort in her legs throughout the day.  She had previously tried increasing the gabapentin dose to 200 mg 3 times daily but it made her dizzy.  I recommend she continue 100 mg 3 times daily for now and add Topamax 25 mg twice daily.  I discussed side effects of Topamax with the patient advised her to call me.  She was advised to keep her scheduled appointment for EMG nerve conduction next month.  She voiced understanding

## 2018-12-12 NOTE — Telephone Encounter (Signed)
I called the patient regarding rescheduling her 11/11 NCV/EMG test due to tech being out. Patient states that this is a huge inconvenience for her due to her feet pain. I advised patient that this is understood and I offered her the next available EMG with provider in our office. Patient is now scheduled for 12/17. Patient states that the pain in her feet is unbearable and explained that she woke up one night recently to use the restroom and describes her pain as "walking on glass." She is inquiring if Dr. Leonie Man can prescribe her anything for this pain. I advised patient that I would send a message to MD & nurse and she will be contacted regarding this.

## 2018-12-12 NOTE — Telephone Encounter (Signed)
I called pt about her having pain in her leg. I stated Dr Leonie Man does not prescribed pain medicine. PT is taking gabapentin 100mg  three times. She could not increase to 200mg  because it made her dizzy. I stated DR. Sethi sent her in topamax to help with her neuropathy. Pt stated the pharmacy gave her a call to state it was ready. I advise pt to take the medication for a week and call us if its helping her neuropathy. Pt verbalized understanding.

## 2018-12-13 ENCOUNTER — Encounter: Payer: Medicare Other | Admitting: Neurology

## 2019-01-05 ENCOUNTER — Other Ambulatory Visit: Payer: Self-pay | Admitting: *Deleted

## 2019-01-05 MED ORDER — VIIBRYD 40 MG PO TABS: 40.0000 mg | ORAL_TABLET | Freq: Every day | ORAL | 0 refills | Status: DC

## 2019-01-05 MED ORDER — HYDROCHLOROTHIAZIDE 12.5 MG PO CAPS: 12.5000 mg | ORAL_CAPSULE | Freq: Every day | ORAL | 1 refills | Status: DC

## 2019-01-08 LAB — HM PAP SMEAR

## 2019-01-09 ENCOUNTER — Encounter: Payer: Self-pay | Admitting: Internal Medicine

## 2019-01-09 NOTE — Progress Notes (Signed)
Abstracted and sent to scan  

## 2019-01-12 ENCOUNTER — Encounter: Payer: Self-pay | Admitting: Internal Medicine

## 2019-01-12 ENCOUNTER — Other Ambulatory Visit: Payer: Self-pay

## 2019-01-12 ENCOUNTER — Ambulatory Visit (INDEPENDENT_AMBULATORY_CARE_PROVIDER_SITE_OTHER): Payer: Medicare Other | Admitting: Internal Medicine

## 2019-01-12 VITALS — BP 110/60 | HR 70 | Temp 97.9°F | Ht 62.0 in | Wt 131.0 lb

## 2019-01-12 DIAGNOSIS — R42 Dizziness and giddiness: Secondary | ICD-10-CM

## 2019-01-12 DIAGNOSIS — R634 Abnormal weight loss: Secondary | ICD-10-CM

## 2019-01-12 MED ORDER — MECLIZINE HCL 25 MG PO TABS: 25.0000 mg | ORAL_TABLET | Freq: Three times a day (TID) | ORAL | 0 refills | Status: DC | PRN

## 2019-01-12 NOTE — Patient Instructions (Signed)
We have sent in meclizine which you can take up to 3 times a day for dizziness. Start doing the exercises to help the dizziness. There is a chance that this could be a stroke so if you do not get better in a few days let us know and we can check a brain scan for this.   Benign Positional Vertigo Vertigo is the feeling that you or your surroundings are moving when they are not. Benign positional vertigo is the most common form of vertigo. This is usually a harmless condition (benign). This condition is positional. This means that symptoms are triggered by certain movements and positions. This condition can be dangerous if it occurs while you are doing something that could cause harm to you or others. This includes activities such as driving or operating machinery. What are the causes? In many cases, the cause of this condition is not known. It may be caused by a disturbance in an area of the inner ear that helps your brain to sense movement and balance. This disturbance can be caused by:  Viral infection (labyrinthitis).  Head injury.  Repetitive motion, such as jumping, dancing, or running. What increases the risk? You are more likely to develop this condition if:  You are a woman.  You are 109 years of age or older. What are the signs or symptoms? Symptoms of this condition usually happen when you move your head or your eyes in different directions. Symptoms may start suddenly, and usually last for less than a minute. They include:  Loss of balance and falling.  Feeling like you are spinning or moving.  Feeling like your surroundings are spinning or moving.  Nausea and vomiting.  Blurred vision.  Dizziness.  Involuntary eye movement (nystagmus). Symptoms can be mild and cause only minor problems, or they can be severe and interfere with daily life. Episodes of benign positional vertigo may return (recur) over time. Symptoms may improve over time. How is this diagnosed? This  condition may be diagnosed based on:  Your medical history.  Physical exam of the head, neck, and ears.  Tests, such as: ? MRI. ? CT scan. ? Eye movement tests. Your health care provider may ask you to change positions quickly while he or she watches you for symptoms of benign positional vertigo, such as nystagmus. Eye movement may be tested with a variety of exams that are designed to evaluate or stimulate vertigo. ? An electroencephalogram (EEG). This records electrical activity in your brain. ? Hearing tests. You may be referred to a health care provider who specializes in ear, nose, and throat (ENT) problems (otolaryngologist) or a provider who specializes in disorders of the nervous system (neurologist). How is this treated?  This condition may be treated in a session in which your health care provider moves your head in specific positions to adjust your inner ear back to normal. Treatment for this condition may take several sessions. Surgery may be needed in severe cases, but this is rare. In some cases, benign positional vertigo may resolve on its own in 2-4 weeks. Follow these instructions at home: Safety  Move slowly. Avoid sudden body or head movements or certain positions, as told by your health care provider.  Avoid driving until your health care provider says it is safe for you to do so.  Avoid operating heavy machinery until your health care provider says it is safe for you to do so.  Avoid doing any tasks that would be dangerous to you  or others if vertigo occurs.  If you have trouble walking or keeping your balance, try using a cane for stability. If you feel dizzy or unstable, sit down right away.  Return to your normal activities as told by your health care provider. Ask your health care provider what activities are safe for you. General instructions  Take over-the-counter and prescription medicines only as told by your health care provider.  Drink enough fluid  to keep your urine pale yellow.  Keep all follow-up visits as told by your health care provider. This is important. Contact a health care provider if:  You have a fever.  Your condition gets worse or you develop new symptoms.  Your family or friends notice any behavioral changes.  You have nausea or vomiting that gets worse.  You have numbness or a "pins and needles" sensation. Get help right away if you:  Have difficulty speaking or moving.  Are always dizzy.  Faint.  Develop severe headaches.  Have weakness in your legs or arms.  Have changes in your hearing or vision.  Develop a stiff neck.  Develop sensitivity to light. Summary  Vertigo is the feeling that you or your surroundings are moving when they are not. Benign positional vertigo is the most common form of vertigo.  The cause of this condition is not known. It may be caused by a disturbance in an area of the inner ear that helps your brain to sense movement and balance.  Symptoms include loss of balance and falling, feeling that you or your surroundings are moving, nausea and vomiting, and blurred vision.  This condition can be diagnosed based on symptoms, physical exam, and other tests, such as MRI, CT scan, eye movement tests, and hearing tests.  Follow safety instructions as told by your health care provider. You will also be told when to contact your health care provider in case of problems. This information is not intended to replace advice given to you by your health care provider. Make sure you discuss any questions you have with your health care provider. Document Released: 10/26/2005 Document Revised: 06/29/2017 Document Reviewed: 06/29/2017 Elsevier Patient Education  2020 [employer].

## 2019-01-12 NOTE — Assessment & Plan Note (Signed)
Rx meclizine and epley maneuver given today. If no improvement needs CT head to rule out additional stroke.

## 2019-01-12 NOTE — Assessment & Plan Note (Signed)
CT chest no malignancy June 2020, colonoscopy up to date, mammogram up to date. Could get CT abdomen/pelvis but she declines today preferring to wait until next year when she will be on medicare.

## 2019-01-12 NOTE — Progress Notes (Signed)
   Subjective:   Patient ID: Anna Lyons, female    DOB: 05/25/1955, 63 y.o.   MRN: [id number]  HPI The patient is a 63 YO female coming in for vertigo. Started about 3 days ago. Has been constant since onset. Onset was in the middle of the day. Not with head motion initially. Has history of stroke Also having some tinnitus. Denies hearing loss. Denies new numbness, weakness, facial drooping, speech changes. Denies falls due to this but has been very worried about falling due to balabce. Has no fevers or chills or ear pain. Overall it is not improving. Not really worse with moving head. She did have some popping in her ears which cleared with moving her neck. Has tried nothing for this. Also having other issues including weight loss (poor appetite, abdominal bloating, 15 pounds down since last year, several times in the ER with dehydration in the last year, CT chest June 2020 without evidence of malignancy, colonoscopy 2017 without problems).   Review of Systems  Constitutional: Positive for activity change, appetite change and unexpected weight change. Negative for chills, fatigue and fever.  HENT: Positive for tinnitus. Negative for ear discharge and ear pain.   Eyes: Negative.   Respiratory: Negative for cough, chest tightness and shortness of breath.   Cardiovascular: Negative for chest pain, palpitations and leg swelling.  Gastrointestinal: Negative for abdominal distention, abdominal pain, constipation, diarrhea, nausea and vomiting.  Musculoskeletal: Positive for arthralgias and myalgias.  Skin: Negative.   Neurological: Positive for dizziness and numbness. Negative for tremors, seizures, facial asymmetry, speech difficulty, weakness, light-headedness and headaches.  Psychiatric/Behavioral: Negative.     Objective:  Physical Exam Constitutional:      Appearance: She is well-developed.  HENT:     Head: Normocephalic and atraumatic.  Cardiovascular:     Rate and Rhythm: Normal rate and  regular rhythm.  Pulmonary:     Effort: Pulmonary effort is normal. No respiratory distress.     Breath sounds: Normal breath sounds. No wheezing or rales.  Abdominal:     General: Bowel sounds are normal. There is no distension.     Palpations: Abdomen is soft.     Tenderness: There is no abdominal tenderness. There is no rebound.  Musculoskeletal:     Cervical back: Normal range of motion.  Skin:    General: Skin is warm and dry.  Neurological:     General: No focal deficit present.     Mental Status: She is alert and oriented to person, place, and time. Mental status is at baseline.     Coordination: Coordination normal.     Vitals:   01/12/19 1023  BP: 110/60  Pulse: 70  Temp: 97.9 F (36.6 C)  TempSrc: Oral  SpO2: 97%  Weight: 131 lb (59.4 kg)  Height: 5\' 2"  (1.575 m)    This visit occurred during the SARS-CoV-2 public health emergency.  Safety protocols were in place, including screening questions prior to the visit, additional usage of staff PPE, and extensive cleaning of exam room while observing appropriate contact time as indicated for disinfecting solutions.   Assessment & Plan:  Visit time 25 minutes: greater than 50% of that time was spent in face to face counseling and coordination of care with the patient: counseled about several issues as well as vertigo, potential for new stroke

## 2019-01-18 ENCOUNTER — Ambulatory Visit (INDEPENDENT_AMBULATORY_CARE_PROVIDER_SITE_OTHER): Payer: Medicare Other | Admitting: Diagnostic Neuroimaging

## 2019-01-18 ENCOUNTER — Encounter: Payer: Medicare Other | Admitting: Diagnostic Neuroimaging

## 2019-01-18 ENCOUNTER — Other Ambulatory Visit: Payer: Self-pay

## 2019-01-18 DIAGNOSIS — R202 Paresthesia of skin: Secondary | ICD-10-CM | POA: Diagnosis not present

## 2019-01-18 DIAGNOSIS — Z0289 Encounter for other administrative examinations: Secondary | ICD-10-CM

## 2019-01-18 NOTE — Procedures (Signed)
[facility]  NCS (NERVE CONDUCTION STUDY) WITH EMG (ELECTROMYOGRAPHY) REPORT   STUDY DATE: 01/18/19 PATIENT NAME: Anna Lyons DOB: 1955/08/18 MRN: [id number]  ORDERING CLINICIAN: Antony Contras, MD   TECHNOLOGIST: Sherre Scarlet ELECTROMYOGRAPHER: Earlean Polka. Chana Lindstrom, MD  CLINICAL INFORMATION: 63 year old female with lower extremity numbness and cramps.  Patient reports "cold sensation" in feet.  FINDINGS: NERVE CONDUCTION STUDY: Bilateral tibial and peroneal motor responses are normal.  Bilateral tibial F wave latencies are normal.  Bilateral sural and superficial peroneal sensory responses are normal.   NEEDLE ELECTROMYOGRAPHY:  Needle examination of right lower extremity vastus medialis, tibialis anterior, gastrocnemius is normal.    IMPRESSION:   This is a normal study.  No electrodiagnostic evidence of large fiber neuropathy.   INTERPRETING PHYSICIAN:  Penni Bombard, MD Certified in Neurology, Neurophysiology and Neuroimaging  [facility] [address] [place], [place] [address] [phone]   [facility]    Nerve / Sites Muscle Latency Ref. Amplitude Ref. Rel Amp Segments Distance Velocity Ref. Area    ms ms mV mV %  cm m/s m/s mVms  L Peroneal - EDB     Ankle EDB 3.9 ?6.5 6.6 ?2.0 100 Ankle - EDB 9   22.7     Fib head EDB 9.6  5.8  87.4 Fib head - Ankle 29 51 ?44 21.6     Pop fossa EDB 11.7  5.7  97.4 Pop fossa - Fib head 10 48 ?44 21.3         Pop fossa - Ankle      R Peroneal - EDB     Ankle EDB 5.5 ?6.5 3.8 ?2.0 100 Ankle - EDB 9   14.1     Fib head EDB 11.1  2.6  69.6 Fib head - Ankle 29 53 ?44 10.0     Pop fossa EDB 13.0  2.5  96.6 Pop fossa - Fib head 10 51 ?44 13.5         Pop fossa - Ankle      L Tibial - AH     Ankle AH 4.3 ?5.8 17.1 ?4.0 100 Ankle - AH 9   36.9     Pop fossa AH 12.1  13.7  79.9 Pop fossa - Ankle 38 49 ?41 33.0  R Tibial - AH     Ankle AH 3.6 ?5.8 13.2 ?4.0 100 Ankle - AH 9   27.8   Pop fossa AH 11.4  8.6  65 Pop fossa - Ankle 38 49 ?41 22.4             SNC    Nerve / Sites Rec. Site Peak Lat Ref.  Amp Ref. Segments Distance    ms ms V V  cm  L Sural - Ankle (Calf)     Calf Ankle 3.8 ?4.4 13 ?6 Calf - Ankle 14  R Sural - Ankle (Calf)     Calf Ankle 3.7 ?4.4 15 ?6 Calf - Ankle 14  L Superficial peroneal - Ankle     Lat leg Ankle 4.0 ?4.4 21 ?6 Lat leg - Ankle 14  R Superficial peroneal - Ankle     Lat leg Ankle 4.0 ?4.4 22 ?6 Lat leg - Ankle 14             F  Wave    Nerve F Lat Ref.   ms ms  L Tibial - AH 46.0 ?56.0  R Tibial - AH 47.6 ?56.0  EMG Summary Table    Spontaneous MUAP Recruitment  Muscle IA Fib PSW Fasc Other Amp Dur. Poly Pattern  R. Vastus medialis Normal None None None _______ Normal Normal Normal Normal  R. Tibialis anterior Normal None None None _______ Normal Normal Normal Normal  R. Gastrocnemius (Medial head) Normal None None None _______ Normal Normal Normal Normal

## 2019-01-29 ENCOUNTER — Telehealth: Payer: Self-pay

## 2019-01-29 NOTE — Telephone Encounter (Signed)
Tried to return pts call vm was left to call back.

## 2019-01-29 NOTE — Telephone Encounter (Signed)
Pt was advised of normal results.

## 2019-01-29 NOTE — Telephone Encounter (Signed)
LEft vm for patient to call back EMG results.

## 2019-01-29 NOTE — Telephone Encounter (Signed)
Patient called back for their results. Please follow up

## 2019-01-29 NOTE — Telephone Encounter (Signed)
-----   Message from Garvin Fila, MD sent at 01/26/2019  2:25 PM EST ----- Kindly inform the patient that EMG nerve conduction study was normal

## 2019-02-06 DIAGNOSIS — M5124 Other intervertebral disc displacement, thoracic region: Secondary | ICD-10-CM | POA: Diagnosis not present

## 2019-02-06 DIAGNOSIS — M5126 Other intervertebral disc displacement, lumbar region: Secondary | ICD-10-CM | POA: Diagnosis not present

## 2019-02-06 DIAGNOSIS — M9903 Segmental and somatic dysfunction of lumbar region: Secondary | ICD-10-CM | POA: Diagnosis not present

## 2019-02-06 DIAGNOSIS — M5431 Sciatica, right side: Secondary | ICD-10-CM | POA: Diagnosis not present

## 2019-02-06 DIAGNOSIS — M9905 Segmental and somatic dysfunction of pelvic region: Secondary | ICD-10-CM | POA: Diagnosis not present

## 2019-02-06 DIAGNOSIS — M9902 Segmental and somatic dysfunction of thoracic region: Secondary | ICD-10-CM | POA: Diagnosis not present

## 2019-02-09 DIAGNOSIS — H16141 Punctate keratitis, right eye: Secondary | ICD-10-CM | POA: Diagnosis not present

## 2019-02-09 DIAGNOSIS — H43391 Other vitreous opacities, right eye: Secondary | ICD-10-CM | POA: Diagnosis not present

## 2019-02-14 DIAGNOSIS — M9903 Segmental and somatic dysfunction of lumbar region: Secondary | ICD-10-CM | POA: Diagnosis not present

## 2019-02-14 DIAGNOSIS — M9905 Segmental and somatic dysfunction of pelvic region: Secondary | ICD-10-CM | POA: Diagnosis not present

## 2019-02-14 DIAGNOSIS — M5431 Sciatica, right side: Secondary | ICD-10-CM | POA: Diagnosis not present

## 2019-02-14 DIAGNOSIS — M5126 Other intervertebral disc displacement, lumbar region: Secondary | ICD-10-CM | POA: Diagnosis not present

## 2019-02-14 DIAGNOSIS — M5124 Other intervertebral disc displacement, thoracic region: Secondary | ICD-10-CM | POA: Diagnosis not present

## 2019-02-14 DIAGNOSIS — M9902 Segmental and somatic dysfunction of thoracic region: Secondary | ICD-10-CM | POA: Diagnosis not present

## 2019-02-15 ENCOUNTER — Telehealth: Payer: Self-pay

## 2019-02-15 NOTE — Telephone Encounter (Signed)
Copied from [place] [id number]. Topic: General - Other >> Feb 15, 2019  1:39 PM Rayann Heman wrote: Reason for CRM: pt called and stated that [facility] is faxing over a tier accepting form and will need this filled out as soon as possible.

## 2019-02-15 NOTE — Telephone Encounter (Signed)
Noted will be on the look out for it

## 2019-02-20 DIAGNOSIS — M5124 Other intervertebral disc displacement, thoracic region: Secondary | ICD-10-CM | POA: Diagnosis not present

## 2019-02-20 DIAGNOSIS — M9903 Segmental and somatic dysfunction of lumbar region: Secondary | ICD-10-CM | POA: Diagnosis not present

## 2019-02-20 DIAGNOSIS — M9902 Segmental and somatic dysfunction of thoracic region: Secondary | ICD-10-CM | POA: Diagnosis not present

## 2019-02-20 DIAGNOSIS — M5431 Sciatica, right side: Secondary | ICD-10-CM | POA: Diagnosis not present

## 2019-02-20 DIAGNOSIS — M5126 Other intervertebral disc displacement, lumbar region: Secondary | ICD-10-CM | POA: Diagnosis not present

## 2019-02-20 DIAGNOSIS — M9905 Segmental and somatic dysfunction of pelvic region: Secondary | ICD-10-CM | POA: Diagnosis not present

## 2019-02-27 ENCOUNTER — Other Ambulatory Visit: Payer: Self-pay | Admitting: Internal Medicine

## 2019-02-27 DIAGNOSIS — M9903 Segmental and somatic dysfunction of lumbar region: Secondary | ICD-10-CM | POA: Diagnosis not present

## 2019-02-27 DIAGNOSIS — M5124 Other intervertebral disc displacement, thoracic region: Secondary | ICD-10-CM | POA: Diagnosis not present

## 2019-02-27 DIAGNOSIS — M9905 Segmental and somatic dysfunction of pelvic region: Secondary | ICD-10-CM | POA: Diagnosis not present

## 2019-02-27 DIAGNOSIS — M5431 Sciatica, right side: Secondary | ICD-10-CM | POA: Diagnosis not present

## 2019-02-27 DIAGNOSIS — M9902 Segmental and somatic dysfunction of thoracic region: Secondary | ICD-10-CM | POA: Diagnosis not present

## 2019-02-27 DIAGNOSIS — M5126 Other intervertebral disc displacement, lumbar region: Secondary | ICD-10-CM | POA: Diagnosis not present

## 2019-02-28 ENCOUNTER — Other Ambulatory Visit: Payer: Self-pay

## 2019-02-28 ENCOUNTER — Encounter: Payer: Self-pay | Admitting: Internal Medicine

## 2019-02-28 ENCOUNTER — Ambulatory Visit (INDEPENDENT_AMBULATORY_CARE_PROVIDER_SITE_OTHER): Payer: Medicare HMO | Admitting: Internal Medicine

## 2019-02-28 VITALS — BP 136/84 | HR 65 | Temp 98.5°F | Ht 62.0 in | Wt 135.0 lb

## 2019-02-28 DIAGNOSIS — I739 Peripheral vascular disease, unspecified: Secondary | ICD-10-CM

## 2019-02-28 DIAGNOSIS — R29818 Other symptoms and signs involving the nervous system: Secondary | ICD-10-CM

## 2019-02-28 DIAGNOSIS — R42 Dizziness and giddiness: Secondary | ICD-10-CM

## 2019-02-28 DIAGNOSIS — H9313 Tinnitus, bilateral: Secondary | ICD-10-CM | POA: Diagnosis not present

## 2019-02-28 LAB — FERRITIN: Ferritin: 61.5 ng/mL (ref 10.0–291.0)

## 2019-02-28 LAB — CBC
HCT: 39.5 % (ref 36.0–46.0)
Hemoglobin: 13.4 g/dL (ref 12.0–15.0)
MCHC: 34 g/dL (ref 30.0–36.0)
MCV: 96.3 fl (ref 78.0–100.0)
Platelets: 229 10*3/uL (ref 150.0–400.0)
RBC: 4.09 Mil/uL (ref 3.87–5.11)
RDW: 12.5 % (ref 11.5–15.5)
WBC: 4.8 10*3/uL (ref 4.0–10.5)

## 2019-02-28 LAB — COMPREHENSIVE METABOLIC PANEL
ALT: 20 U/L (ref 0–35)
AST: 25 U/L (ref 0–37)
Albumin: 4.1 g/dL (ref 3.5–5.2)
Alkaline Phosphatase: 76 U/L (ref 39–117)
BUN: 13 mg/dL (ref 6–23)
CO2: 33 mEq/L — ABNORMAL HIGH (ref 19–32)
Calcium: 9.4 mg/dL (ref 8.4–10.5)
Chloride: 93 mEq/L — ABNORMAL LOW (ref 96–112)
Creatinine, Ser: 0.7 mg/dL (ref 0.40–1.20)
GFR: 83.96 mL/min (ref >60.00)
Glucose, Bld: 75 mg/dL (ref 70–99)
Potassium: 3.6 mEq/L (ref 3.5–5.1)
Sodium: 132 mEq/L — ABNORMAL LOW (ref 135–145)
Total Bilirubin: 0.6 mg/dL (ref 0.2–1.2)
Total Protein: 7.1 g/dL (ref 6.0–8.3)

## 2019-02-28 LAB — VITAMIN D 25 HYDROXY (VIT D DEFICIENCY, FRACTURES): VITD: 57.29 ng/mL (ref 30.00–100.00)

## 2019-02-28 LAB — MAGNESIUM: Magnesium: 1.5 mg/dL (ref 1.5–2.5)

## 2019-02-28 LAB — TSH: TSH: 2.19 u[IU]/mL (ref 0.35–4.50)

## 2019-02-28 LAB — VITAMIN B12: Vitamin B-12: 471 pg/mL (ref 211–911)

## 2019-02-28 NOTE — Progress Notes (Signed)
   Subjective:   Patient ID: Anna Lyons, female    DOB: December 27, 1955, 64 y.o.   MRN: [id number]  HPI The patient is a 64 YO female coming in for several concerns including dizziness (still having persistent dizziness since December, had deferred head imaging in December due to insurance status, would like to pursue now), pain in feet (having cramping with extended walking, saw neurologist about this and they did nerve conduction test which was normal, they get cold and tingling some as well with burning pains), tinnitus (worsening lately and distracting her, she sometimes is not able to sleep due to this, also feeling like she is having hearing problems).  Review of Systems  Constitutional: Positive for activity change, appetite change, fatigue and unexpected weight change.  HENT: Positive for tinnitus.   Eyes: Negative.   Respiratory: Negative for cough, chest tightness and shortness of breath.   Cardiovascular: Negative for chest pain, palpitations and leg swelling.  Gastrointestinal: Negative for abdominal distention, abdominal pain, constipation, diarrhea, nausea and vomiting.  Musculoskeletal: Positive for arthralgias and gait problem.  Skin: Negative.   Neurological: Positive for dizziness and light-headedness.  Psychiatric/Behavioral: Negative.     Objective:  Physical Exam Constitutional:      Appearance: She is well-developed.     Comments: thin  HENT:     Head: Normocephalic and atraumatic.  Cardiovascular:     Rate and Rhythm: Normal rate and regular rhythm.  Pulmonary:     Effort: Pulmonary effort is normal. No respiratory distress.     Breath sounds: Normal breath sounds. No wheezing or rales.  Abdominal:     General: Bowel sounds are normal. There is no distension.     Palpations: Abdomen is soft.     Tenderness: There is no abdominal tenderness. There is no rebound.  Musculoskeletal:        General: Tenderness present.     Cervical back: Normal range of motion.   Comments: Poor pulses at the ankle  Skin:    General: Skin is warm and dry.  Neurological:     Mental Status: She is alert and oriented to person, place, and time.     Coordination: Coordination abnormal.     Comments: Slow to stand and some dizziness     Vitals:   02/28/19 0950  BP: 136/84  Pulse: 65  Temp: 98.5 F (36.9 C)  TempSrc: Oral  SpO2: 94%  Weight: 135 lb (61.2 kg)  Height: 5\' 2"  (1.575 m)    This visit occurred during the SARS-CoV-2 public health emergency.  Safety protocols were in place, including screening questions prior to the visit, additional usage of staff PPE, and extensive cleaning of exam room while observing appropriate contact time as indicated for disinfecting solutions.   Assessment & Plan:

## 2019-02-28 NOTE — Assessment & Plan Note (Signed)
Checking MRI brain as this has been persistent since December and past stroke making another stroke more likely.

## 2019-02-28 NOTE — Assessment & Plan Note (Signed)
Referral to audiology to assess.

## 2019-02-28 NOTE — Assessment & Plan Note (Signed)
Checking ABI bilateral as she is a smoker and nerve evaluation was normal.

## 2019-02-28 NOTE — Patient Instructions (Signed)
We will check the scan of the brain.   We will check the hearing.  We will check the circulation in the legs.   We are checking the labs today.

## 2019-03-07 ENCOUNTER — Other Ambulatory Visit: Payer: Self-pay

## 2019-03-07 ENCOUNTER — Ambulatory Visit (HOSPITAL_COMMUNITY)
Admission: RE | Admit: 2019-03-07 | Discharge: 2019-03-07 | Disposition: A | Discharge status: Home / Self Care | Payer: Medicare HMO | Source: Ambulatory Visit | Attending: Internal Medicine | Admitting: Internal Medicine

## 2019-03-07 DIAGNOSIS — I739 Peripheral vascular disease, unspecified: Secondary | ICD-10-CM | POA: Diagnosis not present

## 2019-03-14 ENCOUNTER — Other Ambulatory Visit: Payer: Self-pay | Admitting: Internal Medicine

## 2019-03-14 DIAGNOSIS — M79606 Pain in leg, unspecified: Secondary | ICD-10-CM

## 2019-03-19 ENCOUNTER — Other Ambulatory Visit: Payer: Self-pay

## 2019-03-19 MED ORDER — GABAPENTIN 100 MG PO CAPS: 200.0000 mg | ORAL_CAPSULE | Freq: Three times a day (TID) | ORAL | 3 refills | Status: DC

## 2019-03-19 MED ORDER — TOPIRAMATE 25 MG PO TABS: 25.0000 mg | ORAL_TABLET | Freq: Two times a day (BID) | ORAL | 3 refills | Status: DC

## 2019-03-20 ENCOUNTER — Ambulatory Visit
Admission: RE | Admit: 2019-03-20 | Discharge: 2019-03-20 | Disposition: A | Discharge status: Home / Self Care | Payer: Medicare HMO | Source: Ambulatory Visit | Attending: Internal Medicine | Admitting: Internal Medicine

## 2019-03-20 ENCOUNTER — Other Ambulatory Visit: Payer: Self-pay

## 2019-03-20 DIAGNOSIS — R29818 Other symptoms and signs involving the nervous system: Secondary | ICD-10-CM

## 2019-03-20 DIAGNOSIS — R410 Disorientation, unspecified: Secondary | ICD-10-CM | POA: Diagnosis not present

## 2019-03-20 MED ORDER — HYDROCHLOROTHIAZIDE 12.5 MG PO CAPS: 12.5000 mg | ORAL_CAPSULE | Freq: Every day | ORAL | 3 refills | Status: DC

## 2019-03-20 MED ORDER — ATORVASTATIN CALCIUM 80 MG PO TABS: 80.0000 mg | ORAL_TABLET | Freq: Every day | ORAL | 3 refills | Status: DC

## 2019-03-20 MED ORDER — PRAMIPEXOLE DIHYDROCHLORIDE 0.5 MG PO TABS: 0.5000 mg | ORAL_TABLET | Freq: Every evening | ORAL | 3 refills | Status: DC | PRN

## 2019-03-20 MED ORDER — BACLOFEN 20 MG PO TABS: 20.0000 mg | ORAL_TABLET | Freq: Three times a day (TID) | ORAL | 3 refills | Status: DC

## 2019-03-21 DIAGNOSIS — M5124 Other intervertebral disc displacement, thoracic region: Secondary | ICD-10-CM | POA: Diagnosis not present

## 2019-03-21 DIAGNOSIS — M5431 Sciatica, right side: Secondary | ICD-10-CM | POA: Diagnosis not present

## 2019-03-21 DIAGNOSIS — M5126 Other intervertebral disc displacement, lumbar region: Secondary | ICD-10-CM | POA: Diagnosis not present

## 2019-03-21 DIAGNOSIS — M9905 Segmental and somatic dysfunction of pelvic region: Secondary | ICD-10-CM | POA: Diagnosis not present

## 2019-03-21 DIAGNOSIS — M9902 Segmental and somatic dysfunction of thoracic region: Secondary | ICD-10-CM | POA: Diagnosis not present

## 2019-03-21 DIAGNOSIS — M9903 Segmental and somatic dysfunction of lumbar region: Secondary | ICD-10-CM | POA: Diagnosis not present

## 2019-03-23 DIAGNOSIS — G43109 Migraine with aura, not intractable, without status migrainosus: Secondary | ICD-10-CM | POA: Diagnosis not present

## 2019-03-23 DIAGNOSIS — H43391 Other vitreous opacities, right eye: Secondary | ICD-10-CM | POA: Diagnosis not present

## 2019-03-27 ENCOUNTER — Encounter: Payer: Self-pay | Admitting: Internal Medicine

## 2019-03-27 ENCOUNTER — Telehealth: Payer: Self-pay

## 2019-03-27 MED ORDER — LEVETIRACETAM 500 MG PO TABS: 500.0000 mg | ORAL_TABLET | Freq: Two times a day (BID) | ORAL | 1 refills | Status: DC

## 2019-03-27 NOTE — Telephone Encounter (Signed)
  Medication Requested:  levETIRAcetam (KEPPRA) 500 MG tablet  temazepam (RESTORIL) 15 MG capsule  Is medication on med list: Yes  (if no, inform pt they may need an appointment)  Is medication a controled: Yes  (yes = last OV with PCP)  -Controlled Substances: Adderall, Ritalin, oxycodone, hydrocodone, methadone, alprazolam, etc  Last visit with PCP: 1.27.21   Is the OV > than 4 months: (yes = schedule an appt if one is not already made)  Pharmacy (Name, [place]): [facility] order - 90 days supply with 3 refills

## 2019-03-27 NOTE — Telephone Encounter (Signed)
Filled 01/22/2019 Temazepam 15 Mg Capsule 60#  Last OV 03/14/19 Next OV n/s

## 2019-03-28 ENCOUNTER — Telehealth: Payer: Self-pay

## 2019-03-28 MED ORDER — TEMAZEPAM 15 MG PO CAPS: ORAL_CAPSULE | ORAL | 1 refills | Status: DC

## 2019-03-28 NOTE — Telephone Encounter (Signed)
We can only send it one place but can cancel at mail order and send to local if she wants.

## 2019-03-28 NOTE — Addendum Note (Signed)
Addended by: Pricilla Holm A on: 03/28/2019 09:11 AM   Modules accepted: Orders

## 2019-03-28 NOTE — Telephone Encounter (Signed)
  The patient is out of Medication she called [facility] to see if the medication can be sent local pharmacy until the mail order is filled.      Medication Requested: temazepam (RESTORIL) 15 MG capsule  Is medication on med list: Yes  (if no, inform pt they may need an appointment)  Is medication a controled: No  (yes = last OV with PCP)  -Controlled Substances: Adderall, Ritalin, oxycodone, hydrocodone, methadone, alprazolam, etc  Last visit with PCP:   Is the OV > than 4 months: (yes = schedule an appt if one is not already made)  Pharmacy (Name, [place]): [facility]

## 2019-03-29 ENCOUNTER — Telehealth: Payer: Self-pay | Admitting: Diagnostic Neuroimaging

## 2019-03-29 NOTE — Telephone Encounter (Addendum)
I call pts daughter Arrie Aran about her concerns. Pt stated her mom has blurred vision at times, fatigue,dizzy spells, and numbness in feet and sometimes her muscles lock up.Dawn stated she was talking to a friend and they told her pt could have MS. Dawn stated her friend had these symptoms and was diagnosis with it. I stated pt had a MRI order by PCP last week.  I stated message will be sent to Dr Leonie Man for review. Pts daughter verbalized understanding.

## 2019-03-29 NOTE — Telephone Encounter (Signed)
Patient daughter dawn called in regards to patient wanting to discuss a few things that have been going on  Requested a cb from rn

## 2019-03-29 NOTE — Telephone Encounter (Signed)
DR Leonie Man this is Arrie Aran number pts daughter  [phone].

## 2019-03-30 DIAGNOSIS — H903 Sensorineural hearing loss, bilateral: Secondary | ICD-10-CM | POA: Diagnosis not present

## 2019-03-30 DIAGNOSIS — R2689 Other abnormalities of gait and mobility: Secondary | ICD-10-CM | POA: Diagnosis not present

## 2019-03-30 DIAGNOSIS — Z8673 Personal history of transient ischemic attack (TIA), and cerebral infarction without residual deficits: Secondary | ICD-10-CM | POA: Diagnosis not present

## 2019-03-30 DIAGNOSIS — H9113 Presbycusis, bilateral: Secondary | ICD-10-CM | POA: Diagnosis not present

## 2019-03-30 DIAGNOSIS — F17200 Nicotine dependence, unspecified, uncomplicated: Secondary | ICD-10-CM | POA: Diagnosis not present

## 2019-04-02 NOTE — Telephone Encounter (Signed)
I called and left message for pt`s daughter to call me back

## 2019-04-03 ENCOUNTER — Other Ambulatory Visit: Payer: Self-pay

## 2019-04-03 DIAGNOSIS — M9905 Segmental and somatic dysfunction of pelvic region: Secondary | ICD-10-CM | POA: Diagnosis not present

## 2019-04-03 DIAGNOSIS — M9903 Segmental and somatic dysfunction of lumbar region: Secondary | ICD-10-CM | POA: Diagnosis not present

## 2019-04-03 DIAGNOSIS — M9902 Segmental and somatic dysfunction of thoracic region: Secondary | ICD-10-CM | POA: Diagnosis not present

## 2019-04-03 DIAGNOSIS — M5124 Other intervertebral disc displacement, thoracic region: Secondary | ICD-10-CM | POA: Diagnosis not present

## 2019-04-03 DIAGNOSIS — M5431 Sciatica, right side: Secondary | ICD-10-CM | POA: Diagnosis not present

## 2019-04-03 DIAGNOSIS — M5126 Other intervertebral disc displacement, lumbar region: Secondary | ICD-10-CM | POA: Diagnosis not present

## 2019-04-03 MED ORDER — VIIBRYD 40 MG PO TABS: 40.0000 mg | ORAL_TABLET | Freq: Every day | ORAL | 1 refills | Status: DC

## 2019-04-03 NOTE — Telephone Encounter (Signed)
**  Patient currently out of these medications**   Medication Refill - Medication: escitalopram (LEXAPRO) 20 MG tablet Vilazodone HCl (VIIBRYD) 40 MG TABS    Has the patient contacted their pharmacy? Yes.   (Agent: If no, request that the patient contact the pharmacy for the refill.) (Agent: If yes, when and what did the pharmacy advise?)  Preferred Pharmacy (with phone number or street name): [place], [place] [address]  Agent: Please be advised that RX refills may take up to 3 business days. We ask that you follow-up with your pharmacy.

## 2019-04-09 DIAGNOSIS — N903 Dysplasia of vulva, unspecified: Secondary | ICD-10-CM | POA: Diagnosis not present

## 2019-04-10 NOTE — Telephone Encounter (Signed)
Left vm for patients daughter to call back to give her Dr.Sethi recommendations.

## 2019-04-10 NOTE — Telephone Encounter (Signed)
Kindly advise the patient's daughter that MRI scan of the brain does not show any evidence to suggest MS or other significant neurological disease.  She can keep her scheduled follow-up appointment to discuss her concerns

## 2019-04-12 NOTE — Telephone Encounter (Signed)
Please advise  I do not see Lexapro on the patients current medication list.

## 2019-04-12 NOTE — Telephone Encounter (Signed)
  Patient states [employer] does not have previous order.    1.Medication Requested: Lexapro  2. Pharmacy (Name, [place]): Patient calling to request Lexapro be sent to [facility] [address] [place]  3. On Med List: no  4. Last Visit with PCP: 02/28/19  5. Next visit date with PCP: n/a   Agent: Please be advised that RX refills may take up to 3 business days. We ask that you follow-up with your pharmacy.

## 2019-04-12 NOTE — Telephone Encounter (Signed)
Should be on viibryd right now and not lexapro. Should not be taking both.

## 2019-04-12 NOTE — Telephone Encounter (Signed)
Pt has been informed and expressed understanding.  

## 2019-04-17 DIAGNOSIS — M9902 Segmental and somatic dysfunction of thoracic region: Secondary | ICD-10-CM | POA: Diagnosis not present

## 2019-04-17 DIAGNOSIS — M5126 Other intervertebral disc displacement, lumbar region: Secondary | ICD-10-CM | POA: Diagnosis not present

## 2019-04-17 DIAGNOSIS — M5124 Other intervertebral disc displacement, thoracic region: Secondary | ICD-10-CM | POA: Diagnosis not present

## 2019-04-17 DIAGNOSIS — M5431 Sciatica, right side: Secondary | ICD-10-CM | POA: Diagnosis not present

## 2019-04-17 DIAGNOSIS — M9905 Segmental and somatic dysfunction of pelvic region: Secondary | ICD-10-CM | POA: Diagnosis not present

## 2019-04-17 DIAGNOSIS — M9903 Segmental and somatic dysfunction of lumbar region: Secondary | ICD-10-CM | POA: Diagnosis not present

## 2019-04-26 ENCOUNTER — Other Ambulatory Visit: Payer: Self-pay

## 2019-04-26 ENCOUNTER — Encounter: Payer: Self-pay | Admitting: Adult Health

## 2019-04-26 ENCOUNTER — Ambulatory Visit: Payer: Medicare HMO | Admitting: Adult Health

## 2019-04-26 VITALS — BP 117/73 | HR 74 | Temp 97.0°F | Ht 63.0 in | Wt 136.0 lb

## 2019-04-26 DIAGNOSIS — G2581 Restless legs syndrome: Secondary | ICD-10-CM | POA: Diagnosis not present

## 2019-04-26 DIAGNOSIS — G8929 Other chronic pain: Secondary | ICD-10-CM | POA: Diagnosis not present

## 2019-04-26 DIAGNOSIS — Z8673 Personal history of transient ischemic attack (TIA), and cerebral infarction without residual deficits: Secondary | ICD-10-CM | POA: Diagnosis not present

## 2019-04-26 DIAGNOSIS — M5441 Lumbago with sciatica, right side: Secondary | ICD-10-CM | POA: Diagnosis not present

## 2019-04-26 DIAGNOSIS — R42 Dizziness and giddiness: Secondary | ICD-10-CM

## 2019-04-26 DIAGNOSIS — G3184 Mild cognitive impairment, so stated: Secondary | ICD-10-CM

## 2019-04-26 DIAGNOSIS — G40909 Epilepsy, unspecified, not intractable, without status epilepticus: Secondary | ICD-10-CM

## 2019-04-26 MED ORDER — GABAPENTIN 300 MG PO CAPS: 300.0000 mg | ORAL_CAPSULE | Freq: Every day | ORAL | 3 refills | Status: DC

## 2019-04-26 NOTE — Patient Instructions (Addendum)
Your Plan:  Concern  For possible lumbar spinal stenosis causing your ongoing symptoms  -will obtain MRI lumbar spine for further evaluation - you will be called to schedule imaging appointment -recommend changing gabapentin to 300mg  nightly as this may help alleviate day time side effects -discontinue topamax as no benefit and possible side effects  Memory concerns -possibly mild cognitive impairment slowly declining vs increased stress with underlying depression/anxiety due to current medical issues and ongoing pain vs medication side effect. -if ongoing concerns at follow up visit, will further evaluate at that time   Follow up in 2 months or call earlier if needed      Thank you for coming to see [place] at [facility]. I hope we have been able to provide you high quality care today.  You may receive a patient satisfaction survey over the next few weeks. We would appreciate your feedback and comments so that we may continue to improve ourselves and the health of our patients.    Spinal Stenosis  Spinal stenosis occurs when the open space (spinal canal) between the bones of your spine (vertebrae) narrows, putting pressure on the spinal cord or nerves. What are the causes? This condition is caused by areas of bone pushing into the central canals of your vertebrae. This condition may be present at birth (congenital), or it may be caused by:  Arthritic deterioration of your vertebrae (spinal degeneration). This usually starts around age 22.  Injury or trauma to the spine.  Tumors in the spine.  Calcium deposits in the spine. What are the signs or symptoms? Symptoms of this condition include:  Pain in the neck or back that is generally worse with activities, particularly when standing and walking.  Numbness, tingling, hot or cold sensations, weakness, or weariness in your legs.  Pain going up and down the leg (sciatica).  Frequent episodes of falling.  A  foot-slapping gait that leads to muscle weakness. In more serious cases, you may develop:  Problemspassing stool or passing urine.  Difficulty having sex.  Loss of feeling in part or all of your leg. Symptoms may come on slowly and get worse over time. How is this diagnosed? This condition is diagnosed based on your medical history and a physical exam. Tests will also be done, such as:  MRI.  CT scan.  X-ray. How is this treated? Treatment for this condition often focuses on managing your pain and any other symptoms. Treatment may include:  Practicing good posture to lessen pressure on your nerves.  Exercising to strengthen muscles, build endurance, improve balance, and maintain good joint movement (range of motion).  Losing weight, if needed.  Taking medicines to reduce swelling, inflammation, or pain.  Assistive devices, such as a corset or brace. In some cases, surgery may be needed. The most common procedure is decompression laminectomy. This is done to remove excess bone that puts pressure on your nerve roots. Follow these instructions at home: Managing pain, stiffness, and swelling  Do all exercises and stretches as told by your health care provider.  Practice good posture. If you were given a brace or a corset, wear it as told by your health care provider.  Do not do any activities that cause pain. Ask your health care provider what activities are safe for you.  Do not lift anything that is heavier than 10 lb (4.5 kg) or the limit that your health care provider tells you.  Maintain a healthy weight. Talk with your health care  provider if you need help losing weight.  If directed, apply heat to the affected area as often as told by your health care provider. Use the heat source that your health care provider recommends, such as a moist heat pack or a heating pad. ? Place a towel between your skin and the heat source. ? Leave the heat on for 20-30 minutes. ? Remove  the heat if your skin turns bright red. This is especially important if you are not able to feel pain, heat, or cold. You may have a greater risk of getting burned. General instructions  Take over-the-counter and prescription medicines only as told by your health care provider.  Do not use any products that contain nicotine or tobacco, such as cigarettes and e-cigarettes. If you need help quitting, ask your health care provider.  Eat a healthy diet. This includes plenty of fruits and vegetables, whole grains, and low-fat (lean) protein.  Keep all follow-up visits as told by your health care provider. This is important. Contact a health care provider if:  Your symptoms do not get better or they get worse.  You have a fever. Get help right away if:  You have new or worse pain in your neck or upper back.  You have severe pain that cannot be controlled with medicines.  You are dizzy.  You have vision problems, blurred vision, or double vision.  You have a severe headache that is worse when you stand.  You have nausea or you vomit.  You develop new or worse numbness or tingling in your back or legs.  You have pain, redness, swelling, or warmth in your arm or leg. Summary  Spinal stenosis occurs when the open space (spinal canal) between the bones of your spine (vertebrae) narrows. This narrowing puts pressure on the spinal cord or nerves.  Spinal stenosis can cause numbness, weakness, or pain in the neck, back, and legs.  This condition may be caused by a birth defect, arthritic deterioration of your vertebrae, injury, tumors, or calcium deposits.  This condition is usually diagnosed with MRIs, CT scans, and X-rays. This information is not intended to replace advice given to you by your health care provider. Make sure you discuss any questions you have with your health care provider. Document Revised: 12/31/2016 Document Reviewed: 12/24/2015 Elsevier Patient Education  2020  [employer].

## 2019-04-26 NOTE — Progress Notes (Signed)
Handicap placard form signed and to pt. Copy to scan.

## 2019-04-26 NOTE — Progress Notes (Signed)
[facility]  PATIENT: Anna Lyons DOB: 07-27-1955   REASON FOR VISIT: Follow-up for history of stroke in 2013, seizure disorder , mild cognitive impairment long history of depression HISTORY FROM: Patient and daughter via phone  Chief complaint: Chief Complaint  Patient presents with  . Facial Pain    TxRm, alone. Pain right foot. Numbness in the right toes.      HPI:  Ms. Kimpel is a 64 year old female who is being seen today, 04/26/2019, for follow-up regarding history of stroke 2013, seizure disorder, mild cognitive impairment and neuropathy.  She is alone at today's visit but daughter, Arrie Aran, present via phone.  She continues to experience severe right leg distal pain with numbness/tingling as well as bilateral feet paresthesias.  Unable to tolerate 200 mg dose of gabapentin during the day.  Currently taking 100 mg twice daily with limited benefit.  She has concerns today regarding ongoing dizziness present since December (evaluated in ED w/ dx orthostatic hypotension), fatigue and worsening short-term memory.  MRI obtained recently which was unremarkable for acute abnormality. EMG/NCV completed which was normal.  Recently underwent ABI which was normal.  She does report chronic lower back pain bilaterally and has not previously had lumbar imaging.  She also reports worsening depression/anxiety due to increased stress with ongoing pain and lack of answers.  Denies any recent seizure activity with ongoing use of Keppra 500 mg twice daily.  Continues on Mirapex for restless leg syndrome.  Continues on aspirin and atorvastatin for secondary stroke prevention.  Blood pressure today satisfactory.  Returns today for evaluation.  MR BRAIN WO CONTRAST 03/20/2019 IMPRESSION: No acute abnormality and no change from the prior MRI. Mild chronic microvascular ischemic changes in the white matter stable.  NCV with EMG 01/18/2019 IMPRESSION:  This is a normal study.  No  electrodiagnostic evidence of large fiber neuropathy.  VAS [place] ABI WITH/WO TBI 03/07/2019 Normal     REVIEW OF SYSTEMS: Full 14 system review of systems performed and notable only for paresthesias, depression, anxiety, dizziness, short-term memory deficit and all other negative   ALLERGIES: Allergies  Allergen Reactions  . Codeine Itching  . Oxycodone-Acetaminophen Hives    HOME MEDICATIONS: Outpatient Medications Prior to Visit  Medication Sig Dispense Refill  . albuterol (PROVENTIL HFA;VENTOLIN HFA) 108 (90 Base) MCG/ACT inhaler Inhale 2 puffs into the lungs every 6 (six) hours as needed for wheezing or shortness of breath. 1 Inhaler 0  . aspirin EC 81 MG tablet Take 81 mg by mouth daily.    [name] atorvastatin (LIPITOR) 80 MG tablet Take 1 tablet (80 mg total) by mouth at bedtime. 90 tablet 3  . baclofen (LIORESAL) 20 MG tablet Take 1 tablet (20 mg total) by mouth 3 (three) times daily. 90 tablet 3  . ferrous sulfate 324 MG TBEC Take 324 mg by mouth.    . hydrochlorothiazide (MICROZIDE) 12.5 MG capsule Take 1 capsule (12.5 mg total) by mouth daily. 90 capsule 3  . levETIRAcetam (KEPPRA) 500 MG tablet Take 1 tablet (500 mg total) by mouth 2 (two) times daily. 180 tablet 1  . Magnesium 250 MG TABS Take by mouth.    . Multiple Vitamin (MULTIVITAMIN WITH MINERALS) TABS tablet Take 1 tablet by mouth daily.    . pramipexole (MIRAPEX) 0.5 MG tablet Take 1 tablet (0.5 mg total) by mouth at bedtime as needed. 90 tablet 3  . temazepam (RESTORIL) 15 MG capsule TAKE 1-2 AT BEDTIME AS NEEDED FOR SLEEP 60 capsule 1  .  Vilazodone HCl (VIIBRYD) 40 MG TABS Take 1 tablet (40 mg total) by mouth daily. 90 tablet 1  . gabapentin (NEURONTIN) 100 MG capsule Take 2 capsules (200 mg total) by mouth 3 (three) times daily. 270 capsule 3  . topiramate (TOPAMAX) 25 MG tablet Take 1 tablet (25 mg total) by mouth 2 (two) times daily. 180 tablet 3  . clopidogrel (PLAVIX) 75 MG tablet Take 1 tablet (75 mg total) by  mouth daily. 30 tablet 4  . meclizine (ANTIVERT) 25 MG tablet Take 1 tablet (25 mg total) by mouth 3 (three) times daily as needed for dizziness. (Patient not taking: Reported on 04/26/2019) 30 tablet 0  . ondansetron (ZOFRAN) 4 MG tablet Take 1 tablet (4 mg total) by mouth every 6 (six) hours as needed for nausea or vomiting. (Patient not taking: Reported on 04/26/2019) 12 tablet 0   No facility-administered medications prior to visit.    PAST MEDICAL HISTORY: Past Medical History:  Diagnosis Date  . Angina   . Breast cyst   . Cancer ([place])   . Depression   . Hypertension   . RLS (restless legs syndrome) 11/16/2011  . Seizures ([place]) 2016   last seizure="last year, 2016" per pt.  . Stroke ([place]) 04/20/2011   a. 04/20/11 Left subcortical infarct treated w/ TPA;  b. 04/2011 Echo: EF 55-60%;  c. 04/2011 Normal Carotid u/s  d. Residual "walk w/limp on right; unable to grasp w/right hand"  . TIA (transient ischemic attack) 08/2010  . Tobacco abuse    a. quit @ time of CVA 04/2011.    PAST SURGICAL HISTORY: Past Surgical History:  Procedure Laterality Date  . BREAST RECONSTRUCTION     bilaterally  . CARPAL TUNNEL RELEASE Left 1990's  . CARPAL TUNNEL RELEASE Left 12/08/2015  . CESAREAN SECTION  1979  . KNEE ARTHROSCOPY Left 1990's    cartilage repair  . LEFT HEART CATHETERIZATION WITH CORONARY ANGIOGRAM N/A 05/20/2011   Procedure: LEFT HEART CATHETERIZATION WITH CORONARY ANGIOGRAM;  Surgeon: Josue Hector, MD;  Location: [facility] CATH LAB;  Service: Cardiovascular;  Laterality: N/A;  . MASTECTOMY Bilateral 1980's   bilateral with reconstruction    FAMILY HISTORY: Family History  Problem Relation Age of Onset  . Lupus Mother        died early 31's.  . Emphysema Father        died late 19's.  [name] COPD Father   . Pancreatic cancer Brother   . Bladder Cancer Brother   . Rectal cancer Brother   . Suicidality Brother   . Colon cancer Neg Hx     SOCIAL HISTORY: Social History    Socioeconomic History  . Marital status: Widowed    Spouse name: Not on file  . Number of children: 1  . Years of education: 55  . Highest education level: Not on file  Occupational History  . Occupation: disabled  Tobacco Use  . Smoking status: Current Every Day Smoker    Packs/day: 0.25    Years: 37.00    Pack years: 9.25    Types: Cigarettes  . Smokeless tobacco: Never Used  Substance and Sexual Activity  . Alcohol use: Yes    Alcohol/week: 7.0 standard drinks    Types: 7 Glasses of wine per week    Comment: very occasional  . Drug use: No  . Sexual activity: Not Currently  Other Topics Concern  . Not on file  Social History Narrative   Pt lives alone.   Caffeine  Use: 1-3 cups of caffeine daily (tea)   Social Determinants of Health   Financial Resource Strain:   . Difficulty of Paying Living Expenses:   Food Insecurity:   . Worried About Charity fundraiser in the Last Year:   . Arboriculturist in the Last Year:   Transportation Needs:   . Film/video editor (Medical):   [name] Lack of Transportation (Non-Medical):   Physical Activity:   . Days of Exercise per Week:   . Minutes of Exercise per Session:   Stress:   . Feeling of Stress :   Social Connections:   . Frequency of Communication with Friends and Family:   . Frequency of Social Gatherings with Friends and Family:   . Attends Religious Services:   . Active Member of Clubs or Organizations:   . Attends Archivist Meetings:   [name] Marital Status:   Intimate Partner Violence:   . Fear of Current or Ex-Partner:   . Emotionally Abused:   [name] Physically Abused:   . Sexually Abused:      PHYSICAL EXAM  Vitals:   04/26/19 1037  BP: 117/73  Pulse: 74  Temp: (!) 97 F (36.1 C)  Weight: 136 lb (61.7 kg)  Height: 5\' 3"  (1.6 m)   Body mass index is 24.09 kg/m.   General: Frail pleasant middle-age Caucasian female, seated, frequently grimacing due to pain Head: head normocephalic and  atraumatic.   Neck: supple with no carotid or supraclavicular bruits Cardiovascular: regular rate and rhythm, no murmurs Musculoskeletal: no deformity Skin:  no rash/petichiae Vascular:  Normal pulses all extremities   Neurologic Exam Mental Status: Awake and fully alert. Oriented to place and time. Recent memory impaired and remote memory intact. Attention span, concentration and fund of knowledge appropriate during visit. Mood and affect appropriate for situation.  Cranial Nerves: Pupils equal, briskly reactive to light. Extraocular movements full without nystagmus. Visual fields full to confrontation. Hearing intact. Facial sensation intact. Face, tongue, palate moves normally and symmetrically.  Motor: Normal bulk and tone. Normal strength in all tested extremity muscles except mild chronic decreased right hand dexterity. Sensory.:  Decreased light touch, vibration and pinprick sensation RLE distal along with decreased pinprick sensation over toes bilaterally Coordination: Rapid alternating movements normal in all extremities except decreased right hand. Finger-to-nose and heel-to-shin performed accurately bilaterally. Gait and Station: Arises from chair without difficulty. Stance is normal. Gait demonstrates normal stride length with slight dragging of right leg Reflexes: 1+ and symmetric. Toes downgoing.     ASSESSMENT AND PLAN  64 y.o. year old female  has a past medical history of Angina,  Depression, Hypertension, RLS, Seizures (2016), Stroke (04/20/2011), TIA (08/2010 and 10/2018), and lower extremity paresthesias.    RLE paresthesias:   -MRI lumbar wo contrast to rule out LSS -Some symptoms consistent with claudication - ?  Neurogenic claudication -Recommend trialing gabapentin 300 mg nightly as difficulty tolerating daytime dose -Discontinue Topamax as no clear benefit and possible side effects such as fatigue and memory concerns -NCV/EMG and ABI unremarkable  MCI: -Underlying  history recently worsening per daughter -MRI brain unremarkable -DDx slowly worsening cognitive impairment with prior history of stroke and seizures vs medication side effect vs worsening depression/anxiety -We will further evaluate if indicated at follow-up  Stroke history: -Continue aspirin 81 mg daily and atorvastatin for secondary stroke prevention -Ongoing follow-up with PCP for HTN and HLD management -BP goal 130/90, LDL goal less than 70  Seizure disorder: -Continuation  of Keppra 500 mg twice daily for seizure prophylaxis  Dizziness: -dx orthostatic hypotension 01/2019 -does report limited fluid intake and educated on adequate amount of daily fluid intake.   -Possibly multiple medication usage with side effect -daughter plans on discussing polypharmacy with PCP  -No further evaluate at follow-up visit if indicated  Depression/anxiety: -Continue vilazodone 40 mg daily currently managed by PCP -Discussion regarding possible large component of above symptoms and may need further treatment plan or possibly benefit from psychiatry/psychology referral -defer to PCP  RLS: -Stable on Mirapex 0.5 mg at night as needed -Continue on ferrous sulfate 325 mg daily with recent ferritin level 61.5   Follow-up in 2 months or call earlier if needed  I spent 35 minutes of face-to-face and non-face-to-face time with patient and daughter via telephone.  This included previsit chart review, lab review, study review, order entry, electronic health record documentation, patient education with discussion of above problem list and answered all questions to patient and daughter satisfaction   Frann Rider, [facility]  [facility] [address] [place], [place] [address]  Phone [phone] Fax [phone] Note: This document was prepared with digital dictation and possible smart phrase technology. Any transcriptional errors that result from this process are  unintentional.

## 2019-04-30 ENCOUNTER — Telehealth: Payer: Self-pay | Admitting: Adult Health

## 2019-04-30 NOTE — Telephone Encounter (Signed)
Mcarthur Rossetti Josem Kaufmann: [id number] (exp. 04/30/19 to 05/30/19) order sent to GI. They will reach out to the patient to schedule.

## 2019-05-01 DIAGNOSIS — M5431 Sciatica, right side: Secondary | ICD-10-CM | POA: Diagnosis not present

## 2019-05-01 DIAGNOSIS — M9903 Segmental and somatic dysfunction of lumbar region: Secondary | ICD-10-CM | POA: Diagnosis not present

## 2019-05-01 DIAGNOSIS — M5126 Other intervertebral disc displacement, lumbar region: Secondary | ICD-10-CM | POA: Diagnosis not present

## 2019-05-01 DIAGNOSIS — M5124 Other intervertebral disc displacement, thoracic region: Secondary | ICD-10-CM | POA: Diagnosis not present

## 2019-05-01 DIAGNOSIS — M9902 Segmental and somatic dysfunction of thoracic region: Secondary | ICD-10-CM | POA: Diagnosis not present

## 2019-05-01 DIAGNOSIS — M9905 Segmental and somatic dysfunction of pelvic region: Secondary | ICD-10-CM | POA: Diagnosis not present

## 2019-05-04 NOTE — Progress Notes (Signed)
I agree with the above plan 

## 2019-05-09 DIAGNOSIS — H524 Presbyopia: Secondary | ICD-10-CM | POA: Diagnosis not present

## 2019-05-09 DIAGNOSIS — I10 Essential (primary) hypertension: Secondary | ICD-10-CM | POA: Diagnosis not present

## 2019-05-14 ENCOUNTER — Telehealth: Payer: Self-pay | Admitting: Internal Medicine

## 2019-05-14 NOTE — Telephone Encounter (Signed)
    1.Medication Requested:temazepam (RESTORIL) 15 MG capsule  2. Pharmacy (Name, [address], [place]): [facility]  3. On Med List: yes  4. Last Visit with PCP: 02/28/19  5. Next visit date with PCP:05/16/19   Agent: Please be advised that RX refills may take up to 3 business days. We ask that you follow-up with your pharmacy.

## 2019-05-14 NOTE — Telephone Encounter (Signed)
Check [place] registry last filled 03/28/2019.Marland KitchenJohny Chess

## 2019-05-16 ENCOUNTER — Other Ambulatory Visit: Payer: Self-pay

## 2019-05-16 ENCOUNTER — Encounter: Payer: Self-pay | Admitting: Internal Medicine

## 2019-05-16 ENCOUNTER — Ambulatory Visit
Admission: RE | Admit: 2019-05-16 | Discharge: 2019-05-16 | Disposition: A | Discharge status: Home / Self Care | Payer: Medicare HMO | Source: Ambulatory Visit | Attending: Adult Health | Admitting: Adult Health

## 2019-05-16 ENCOUNTER — Ambulatory Visit (INDEPENDENT_AMBULATORY_CARE_PROVIDER_SITE_OTHER): Payer: Medicare HMO | Admitting: Internal Medicine

## 2019-05-16 VITALS — BP 124/72 | HR 72 | Temp 98.8°F | Ht 63.0 in | Wt 135.8 lb

## 2019-05-16 DIAGNOSIS — G8929 Other chronic pain: Secondary | ICD-10-CM | POA: Diagnosis not present

## 2019-05-16 DIAGNOSIS — F331 Major depressive disorder, recurrent, moderate: Secondary | ICD-10-CM

## 2019-05-16 DIAGNOSIS — R202 Paresthesia of skin: Secondary | ICD-10-CM | POA: Diagnosis not present

## 2019-05-16 DIAGNOSIS — M5441 Lumbago with sciatica, right side: Secondary | ICD-10-CM | POA: Diagnosis not present

## 2019-05-16 DIAGNOSIS — R42 Dizziness and giddiness: Secondary | ICD-10-CM

## 2019-05-16 NOTE — Telephone Encounter (Signed)
It was sent in for #60 with 1 refill in Feb so she should still have a refill per [place] controlled database and she should call her pharmacy to fill.

## 2019-05-16 NOTE — Progress Notes (Signed)
   Subjective:   Patient ID: Anna Lyons, female    DOB: Jul 26, 1955, 64 y.o.   MRN: [id number]  HPI The patient is a 64 YO female coming in for concerns about memory (hard to focus and remember, struggled with depression severe for many years, this is about the same lately, maybe slightly worse due to the ongoing pain in her legs, recent imaging brain without new stroke, past stroke, just getting worse over the last year or so, some problems with getting lost driving even with gps) and pain in legs (had nerve conduction and ABI which were fairly normal, getting MRI lumbar soon to assess for that as cause of the pain in the left leg and foot, this is causing a lot of mood problems and constant pain, taking gabapentin and mirapex which help some, wants to know the cause also and work toward alleviating this not just treating this) and dizziness (saw neurology and they were concerned about medication side effects, wanted her to see Anna Lyons to see if they could clarify regimen, workup negative for acute neurological cause, intermittent, lightheadedness or just unstableness, denies room spinning with this). Patient and her daughter are present and help to provide history.   Review of Systems  Constitutional: Negative.   HENT: Negative.   Eyes: Negative.   Respiratory: Negative for cough, chest tightness and shortness of breath.   Cardiovascular: Negative for chest pain, palpitations and leg swelling.  Gastrointestinal: Negative for abdominal distention, abdominal pain, constipation, diarrhea, nausea and vomiting.  Musculoskeletal: Positive for arthralgias and myalgias.  Skin: Negative.   Neurological: Positive for dizziness and numbness.  Psychiatric/Behavioral: Positive for decreased concentration and dysphoric mood.       Memory changes    Objective:  Physical Exam Constitutional:      Appearance: She is well-developed.  HENT:     Head: Normocephalic and atraumatic.  Cardiovascular:     Rate and  Rhythm: Normal rate and regular rhythm.  Pulmonary:     Effort: Pulmonary effort is normal. No respiratory distress.     Breath sounds: Normal breath sounds. No wheezing or rales.  Abdominal:     General: Bowel sounds are normal. There is no distension.     Palpations: Abdomen is soft.     Tenderness: There is no abdominal tenderness. There is no rebound.  Musculoskeletal:     Cervical back: Normal range of motion.  Skin:    General: Skin is warm and dry.  Neurological:     Mental Status: She is alert and oriented to person, place, and time.     Coordination: Coordination normal.     Vitals:   05/16/19 1028  BP: 124/72  Pulse: 72  Temp: 98.8 F (37.1 C)  SpO2: 99%  Weight: 135 lb 12.8 oz (61.6 kg)  Height: 5\' 3"  (1.6 m)    This visit occurred during the SARS-CoV-2 public health emergency.  Safety protocols were in place, including screening questions prior to the visit, additional usage of staff PPE, and extensive cleaning of exam room while observing appropriate contact time as indicated for disinfecting solutions.   Assessment & Plan:

## 2019-05-16 NOTE — Telephone Encounter (Signed)
Notified pt w/MD response.../lmb 

## 2019-05-16 NOTE — Patient Instructions (Signed)
We will get you in the memory specialist  It is okay to try stopping the baclofen for a few days to see if you notice a change.

## 2019-05-18 NOTE — Assessment & Plan Note (Signed)
Getting MRI lumbar to assess further as she has had nerve conduction and ABI to rule out other causes.

## 2019-05-18 NOTE — Assessment & Plan Note (Signed)
Will stop baclofen to see if this helps improve dizziness without causing too much pain to return. Depending on results we may adjust dosing. We could consider trials off other medications as well but would not stop viibryd as this has done much better than many other agents we have tried for depression and she has had severe depression in the past.

## 2019-05-18 NOTE — Assessment & Plan Note (Signed)
The chronic pain is likely worsening symptoms but still overall with acceptable control on viibryd at this time.

## 2019-05-21 ENCOUNTER — Telehealth: Payer: Self-pay | Admitting: Adult Health

## 2019-05-21 DIAGNOSIS — G8929 Other chronic pain: Secondary | ICD-10-CM

## 2019-05-21 DIAGNOSIS — M5146 Schmorl's nodes, lumbar region: Secondary | ICD-10-CM

## 2019-05-21 DIAGNOSIS — M5441 Lumbago with sciatica, right side: Secondary | ICD-10-CM

## 2019-05-21 DIAGNOSIS — M48061 Spinal stenosis, lumbar region without neurogenic claudication: Secondary | ICD-10-CM

## 2019-05-21 NOTE — Telephone Encounter (Signed)
Spoke to [place], patient's daughter, in regards to results of lumbar spinal imaging.   MRI lumbar spine (without) demonstrating: - At L4-5: disc bulging and facet hypertrophy with moderate biforaminal stenosis.  - At L2-3. L3-4: disc bulging with mild biforaminal stenosis. - Schmorl's node at L1 to extending superiorly into the L1 vertebral body, resulting in L1 marrow edema.   Chronic lower back pain with radiculopathy and bilateral lower extremity paresthesias have been ongoing without benefit of conservative treatment/therapy.  Referral will be placed to neurosurgery for further evaluation and possible intervention if warranted. 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 Dawn does believe she has previously been seen by rheumatology back in 2017 but no additional work-up or recommendations done at that time (unable to personally review through epic).  Advised to follow-up with PCP Dr. Sharlet [place]ina in regards to prior work-up and evaluation by rheumatology  Answered all questions to satisfaction and daughter appreciative of phone call

## 2019-05-21 NOTE — Addendum Note (Signed)
Addended by: Mal Misty on: 05/21/2019 10:39 AM   Modules accepted: Orders

## 2019-05-21 NOTE — Telephone Encounter (Signed)
Attempted to call daughter in regards to Anna Lyons's MRI lumbar spine results.  Left message requesting callback to go over the results and recommended referral to neurosurgery for further evaluation and possible intervention.

## 2019-05-23 ENCOUNTER — Other Ambulatory Visit: Payer: Self-pay

## 2019-05-23 NOTE — Telephone Encounter (Signed)
1.Medication Requested:temazepam (RESTORIL) 15 MG capsule   2. Pharmacy (Name, [place], [place], [place]  3. On Med List:  Yes   4. Last Visit with PCP: 4.14.21  5. Next visit date with PCP: no appt is made at this time    Agent: Please be advised that RX refills may take up to 3 business days. We ask that you follow-up with your pharmacy.

## 2019-05-24 ENCOUNTER — Ambulatory Visit (INDEPENDENT_AMBULATORY_CARE_PROVIDER_SITE_OTHER): Payer: Medicare HMO | Admitting: Internal Medicine

## 2019-05-24 ENCOUNTER — Encounter: Payer: Self-pay | Admitting: Internal Medicine

## 2019-05-24 ENCOUNTER — Other Ambulatory Visit: Payer: Self-pay

## 2019-05-24 VITALS — BP 148/66 | HR 72 | Temp 97.9°F | Ht 63.0 in | Wt 136.0 lb

## 2019-05-24 DIAGNOSIS — R42 Dizziness and giddiness: Secondary | ICD-10-CM

## 2019-05-24 DIAGNOSIS — I739 Peripheral vascular disease, unspecified: Secondary | ICD-10-CM

## 2019-05-24 DIAGNOSIS — R209 Unspecified disturbances of skin sensation: Secondary | ICD-10-CM | POA: Diagnosis not present

## 2019-05-24 MED ORDER — IBUPROFEN 800 MG PO TABS: 800.0000 mg | ORAL_TABLET | Freq: Three times a day (TID) | ORAL | 1 refills | Status: DC | PRN

## 2019-05-24 MED ORDER — TEMAZEPAM 15 MG PO CAPS: ORAL_CAPSULE | ORAL | 3 refills | Status: DC

## 2019-05-24 NOTE — Patient Instructions (Signed)
The temazepam is at the [facility] so you can pick that up today. We have sent in refills as well as the ibuprofen to the mail order.  We will check you for lupus today.

## 2019-05-24 NOTE — Assessment & Plan Note (Signed)
Previously with non-specific positive ANA reviewed with her today. Evaluated by rheumatology at the time who felt her smoking was more likely the issue and not a rheumatologic disease.

## 2019-05-24 NOTE — Progress Notes (Signed)
   Subjective:   Patient ID: Anna Lyons, female    DOB: 11-Mar-1955, 64 y.o.   MRN: [id number]  HPI The patient is a 64 YO female coming in for concerns about lupus (mother with lupus, she herself had a positive ANA about 4 years ago, denies rash on face, some joints pains especially her shoulder, cold hands and feet, denies rash elsewhere), and dizziness (she has stopped baclofen and dizziness is gone, denies increase in pain since stopping baclofen) and left leg pain and numbness (taking ibuprofen at night, this does help, recent MRI through neurology with findings and they have referred her to neurosurgery).   Review of Systems  Constitutional: Negative.   HENT: Negative.   Eyes: Negative.   Respiratory: Negative for cough, chest tightness and shortness of breath.   Cardiovascular: Negative for chest pain, palpitations and leg swelling.  Gastrointestinal: Negative for abdominal distention, abdominal pain, constipation, diarrhea, nausea and vomiting.  Endocrine:       Cold hands and feet  Musculoskeletal: Positive for arthralgias and myalgias.  Skin: Negative.   Neurological: Positive for numbness.  Psychiatric/Behavioral: Negative.     Objective:  Physical Exam Constitutional:      Appearance: She is well-developed.  HENT:     Head: Normocephalic and atraumatic.  Cardiovascular:     Rate and Rhythm: Normal rate and regular rhythm.  Pulmonary:     Effort: Pulmonary effort is normal. No respiratory distress.     Breath sounds: Normal breath sounds. No wheezing or rales.  Abdominal:     General: Bowel sounds are normal. There is no distension.     Palpations: Abdomen is soft.     Tenderness: There is no abdominal tenderness. There is no rebound.  Musculoskeletal:        General: Tenderness present.     Cervical back: Normal range of motion.  Skin:    General: Skin is warm and dry.  Neurological:     Mental Status: She is alert and oriented to person, place, and time.   Coordination: Coordination normal.     Vitals:   05/24/19 1039  BP: (!) 148/66  Pulse: 72  Temp: 97.9 F (36.6 C)  SpO2: 99%  Weight: 136 lb (61.7 kg)  Height: 5\' 3"  (1.6 m)    This visit occurred during the SARS-CoV-2 public health emergency.  Safety protocols were in place, including screening questions prior to the visit, additional usage of staff PPE, and extensive cleaning of exam room while observing appropriate contact time as indicated for disinfecting solutions.   Assessment & Plan:

## 2019-05-24 NOTE — Assessment & Plan Note (Signed)
Resolved with cessation of baclofen. Advised she not take this again and have removed from list.

## 2019-05-24 NOTE — Assessment & Plan Note (Signed)
Rx ibuprofen for her left leg pain until neurosurgery can evaluate.

## 2019-05-26 LAB — ANA, IFA COMPREHENSIVE PANEL
Anti Nuclear Antibody (ANA): POSITIVE — AB
ENA SM Ab Ser-aCnc: <1 AI
SM/RNP: <1 AI
SSA (Ro) (ENA) Antibody, IgG: >8 AI — AB
SSB (La) (ENA) Antibody, IgG: <1 AI
Scleroderma (Scl-70) (ENA) Antibody, IgG: <1 AI
ds DNA Ab: 1 IU/mL

## 2019-05-26 LAB — ANTI-NUCLEAR AB-TITER (ANA TITER): ANA Titer 1: 1:640 {titer} — ABNORMAL HIGH

## 2019-05-29 DIAGNOSIS — M5440 Lumbago with sciatica, unspecified side: Secondary | ICD-10-CM | POA: Diagnosis not present

## 2019-05-29 DIAGNOSIS — I739 Peripheral vascular disease, unspecified: Secondary | ICD-10-CM | POA: Diagnosis not present

## 2019-05-29 DIAGNOSIS — G959 Disease of spinal cord, unspecified: Secondary | ICD-10-CM | POA: Diagnosis not present

## 2019-05-30 DIAGNOSIS — M9903 Segmental and somatic dysfunction of lumbar region: Secondary | ICD-10-CM | POA: Diagnosis not present

## 2019-05-30 DIAGNOSIS — M9905 Segmental and somatic dysfunction of pelvic region: Secondary | ICD-10-CM | POA: Diagnosis not present

## 2019-05-30 DIAGNOSIS — M5124 Other intervertebral disc displacement, thoracic region: Secondary | ICD-10-CM | POA: Diagnosis not present

## 2019-05-30 DIAGNOSIS — M5431 Sciatica, right side: Secondary | ICD-10-CM | POA: Diagnosis not present

## 2019-05-30 DIAGNOSIS — M9902 Segmental and somatic dysfunction of thoracic region: Secondary | ICD-10-CM | POA: Diagnosis not present

## 2019-05-30 DIAGNOSIS — M5126 Other intervertebral disc displacement, lumbar region: Secondary | ICD-10-CM | POA: Diagnosis not present

## 2019-06-11 ENCOUNTER — Other Ambulatory Visit: Payer: Self-pay | Admitting: *Deleted

## 2019-06-11 DIAGNOSIS — I739 Peripheral vascular disease, unspecified: Secondary | ICD-10-CM

## 2019-06-11 DIAGNOSIS — R200 Anesthesia of skin: Secondary | ICD-10-CM

## 2019-06-12 ENCOUNTER — Telehealth (HOSPITAL_COMMUNITY): Payer: Self-pay

## 2019-06-12 ENCOUNTER — Telehealth: Payer: Self-pay | Admitting: Internal Medicine

## 2019-06-12 DIAGNOSIS — M5124 Other intervertebral disc displacement, thoracic region: Secondary | ICD-10-CM | POA: Diagnosis not present

## 2019-06-12 DIAGNOSIS — M50223 Other cervical disc displacement at C6-C7 level: Secondary | ICD-10-CM | POA: Diagnosis not present

## 2019-06-12 DIAGNOSIS — M50222 Other cervical disc displacement at C5-C6 level: Secondary | ICD-10-CM | POA: Diagnosis not present

## 2019-06-12 DIAGNOSIS — G959 Disease of spinal cord, unspecified: Secondary | ICD-10-CM | POA: Diagnosis not present

## 2019-06-12 DIAGNOSIS — M47814 Spondylosis without myelopathy or radiculopathy, thoracic region: Secondary | ICD-10-CM | POA: Diagnosis not present

## 2019-06-12 NOTE — Progress Notes (Signed)
°  Chronic Care Management   Outreach Note  06/12/2019 Name: [name] MRN: [id number] DOB: [birth year]  Referred by: Hoyt Koch, MD Reason for referral : No chief complaint on file.   An unsuccessful telephone outreach was attempted today. The patient was referred to the pharmacist for assistance with care management and care coordination.   This note is not being shared with the patient for the following reason: To respect privacy (The patient or proxy has requested that the information not be shared).  Follow Up Plan:   Earney Hamburg Upstream Scheduler

## 2019-06-12 NOTE — Telephone Encounter (Signed)

## 2019-06-13 ENCOUNTER — Ambulatory Visit (HOSPITAL_COMMUNITY)
Admission: RE | Admit: 2019-06-13 | Discharge: 2019-06-13 | Disposition: A | Discharge status: Home / Self Care | Payer: Medicare HMO | Source: Ambulatory Visit | Attending: Vascular Surgery | Admitting: Vascular Surgery

## 2019-06-13 ENCOUNTER — Other Ambulatory Visit: Payer: Self-pay

## 2019-06-13 DIAGNOSIS — I739 Peripheral vascular disease, unspecified: Secondary | ICD-10-CM | POA: Diagnosis not present

## 2019-06-13 DIAGNOSIS — R200 Anesthesia of skin: Secondary | ICD-10-CM

## 2019-06-14 DIAGNOSIS — M5440 Lumbago with sciatica, unspecified side: Secondary | ICD-10-CM | POA: Diagnosis not present

## 2019-06-15 ENCOUNTER — Telehealth: Payer: Self-pay | Admitting: Internal Medicine

## 2019-06-15 DIAGNOSIS — I10 Essential (primary) hypertension: Secondary | ICD-10-CM

## 2019-06-15 NOTE — Progress Notes (Signed)
  Chronic Care Management   Note  06/15/2019 Name: Anna Lyons MRN: [id number] DOB: 05-20-1955  Anna Lyons is a 64 y.o. year old female who is a primary care patient of Hoyt Koch, MD. I reached out to [name] by phone today in response to a referral sent by [name]'s PCP, Hoyt Koch, MD.   [name] was given information about Chronic Care Management services today including:  1. CCM service includes personalized support from designated clinical staff supervised by her physician, including individualized plan of care and coordination with other care providers 2. 24/7 contact phone numbers for assistance for urgent and routine care needs. 3. Service will only be billed when office clinical staff spend 20 minutes or more in a month to coordinate care. 4. Only one practitioner may furnish and bill the service in a calendar month. 5. The patient may stop CCM services at any time (effective at the end of the month) by phone call to the office staff.   Patient agreed to services and verbal consent obtained.   This note is not being shared with the patient for the following reason: To respect privacy (The patient or proxy has requested that the information not be shared).  Follow up plan:   Earney Hamburg Upstream Scheduler

## 2019-06-20 ENCOUNTER — Telehealth: Payer: Self-pay

## 2019-06-20 NOTE — Telephone Encounter (Signed)
New message  Seen on  4.22.2021  The patient is checking on the status of lipid referral.

## 2019-06-21 NOTE — Telephone Encounter (Signed)
Patient wanted to talk about lupus, also neurology referral.   She has already seen neurology on 05-21-2019, phone visit, advised patient to direct questions to neurology office

## 2019-06-21 NOTE — Telephone Encounter (Signed)
   Patient to discuss referral to neurology

## 2019-06-22 ENCOUNTER — Telehealth: Payer: Self-pay | Admitting: Internal Medicine

## 2019-06-22 NOTE — Telephone Encounter (Signed)
New message:   Anna Lyons is calling from [facility] to get prior authorization for temazepam (RESTORIL) 15 MG capsule. Please advise.

## 2019-06-24 ENCOUNTER — Encounter: Payer: Self-pay | Admitting: Internal Medicine

## 2019-06-24 DIAGNOSIS — R768 Other specified abnormal immunological findings in serum: Secondary | ICD-10-CM

## 2019-06-25 ENCOUNTER — Ambulatory Visit: Payer: Medicare HMO | Admitting: Adult Health

## 2019-06-26 ENCOUNTER — Encounter: Payer: Self-pay | Admitting: Internal Medicine

## 2019-06-26 NOTE — Telephone Encounter (Signed)
Prior authorization has been sent to [facility]

## 2019-06-29 ENCOUNTER — Ambulatory Visit: Payer: Medicare HMO | Admitting: Internal Medicine

## 2019-06-29 ENCOUNTER — Telehealth: Payer: Self-pay

## 2019-06-29 NOTE — Telephone Encounter (Signed)
Prior Authorization from [facility] for Temazepam 15mg   Is approved until 02/01/2020

## 2019-07-09 ENCOUNTER — Telehealth: Payer: Self-pay | Admitting: Adult Health

## 2019-07-09 MED ORDER — GABAPENTIN 300 MG PO CAPS: 300.0000 mg | ORAL_CAPSULE | Freq: Every day | ORAL | 0 refills | Status: DC

## 2019-07-09 NOTE — Telephone Encounter (Signed)
I called [employer], spoke to [facility].  Need refill for short term at [facility] [address] [facility]) 30 day (300mg  po qhs)  As pt is out, last fill 04-02-19.  matilde will process later after HT is done.

## 2019-07-09 NOTE — Telephone Encounter (Signed)
Anna Lyons from [facility] called stating she is needing a one time request for the pt's gabapentin (NEURONTIN) 300 MG capsule sent in to the [facility] on [address] [phone]

## 2019-07-31 DIAGNOSIS — M5416 Radiculopathy, lumbar region: Secondary | ICD-10-CM | POA: Diagnosis not present

## 2019-07-31 DIAGNOSIS — M48062 Spinal stenosis, lumbar region with neurogenic claudication: Secondary | ICD-10-CM | POA: Diagnosis not present

## 2019-08-01 DIAGNOSIS — M48062 Spinal stenosis, lumbar region with neurogenic claudication: Secondary | ICD-10-CM | POA: Diagnosis not present

## 2019-08-01 DIAGNOSIS — M5416 Radiculopathy, lumbar region: Secondary | ICD-10-CM | POA: Diagnosis not present

## 2019-08-07 NOTE — Addendum Note (Signed)
Addended by: Aviva Signs M on: 08/07/2019 03:08 PM   Modules accepted: Orders

## 2019-08-09 ENCOUNTER — Other Ambulatory Visit: Payer: Self-pay

## 2019-08-09 ENCOUNTER — Ambulatory Visit: Payer: Medicare HMO | Admitting: Pharmacist

## 2019-08-09 DIAGNOSIS — I10 Essential (primary) hypertension: Secondary | ICD-10-CM

## 2019-08-09 DIAGNOSIS — G459 Transient cerebral ischemic attack, unspecified: Secondary | ICD-10-CM

## 2019-08-09 DIAGNOSIS — F332 Major depressive disorder, recurrent severe without psychotic features: Secondary | ICD-10-CM

## 2019-08-09 NOTE — Chronic Care Management (AMB) (Signed)
Chronic Care Management Pharmacy  Name: Anna Lyons  MRN: [id number] DOB: 11-03-1955   Chief Complaint/ HPI  Anna Lyons,  64 y.o. , female presents for their Initial CCM visit with the clinical pharmacist via telephone due to COVID-19 Pandemic.  PCP : Hoyt Koch, MD Patient Care Team: Hoyt Koch, MD as PCP - General (Internal Medicine) Letta Pate Luanna Salk, MD as Consulting Physician (Physical Medicine and Rehabilitation) Charlton Haws, [facility] as Pharmacist (Pharmacist)  Their chronic conditions include: Hypertension, Coronary Artery Disease, Depression, Anxiety, Tobacco use and Seizure disorder, Hx TIA, RLS  Pt lives alone, works as a Public affairs consultant. Her husband passed 7 years ago, after which she got a chihuaha to keep her company. Her best friend passed a year ago, and she has had trouble spending time with new friends. She spends most of her time alone in her house in bed, she has very little appetite and reports depression is severe right now.  Office Visits: 05/24/19 Dr Sharlet Salina OV: rx ibuprofen for claudication until neuro can evaluate. C/o cold hands, previously evaluated by rheum and felt smoking was the issue, not rheum disease  05/16/19 Dr Sharlet Salina OV: paresthesia of both feet - MRI to evaluate. Stopped baclofen due to dizziness.  02/28/19 Dr Sharlet Salina OV: tinnitus - referred to audiology, ordered ABI and MRI for claudication and dizziness in s/o hx stroke, respectively.  Consult Visit: 06/14/19 Dr Saintclair Halsted (neurosurgery): via claims  04/26/19 NP Frann Rider (neurology): RLE paresthesias - rec'd gabapentin 300 mg HS, discontinue topiramate d/t no clear benefit and cognitive side effects.   Regular chiropractor visits.  Allergies  Allergen Reactions  . Codeine Itching  . Oxycodone-Acetaminophen Hives   Medications: Outpatient Encounter Medications as of 08/09/2019  Medication Sig  . aspirin EC 81 MG tablet Take 81 mg by mouth daily.  [name]  atorvastatin (LIPITOR) 80 MG tablet Take 1 tablet (80 mg total) by mouth at bedtime.  . cholecalciferol (VITAMIN D3) 25 MCG (1000 UNIT) tablet Take 1,000 Units by mouth daily.  . ferrous sulfate 324 MG TBEC Take 324 mg by mouth.  . gabapentin (NEURONTIN) 300 MG capsule Take 1 capsule (300 mg total) by mouth at bedtime.  . gabapentin (NEURONTIN) 300 MG capsule Take 1 capsule (300 mg total) by mouth at bedtime.  . hydrochlorothiazide (MICROZIDE) 12.5 MG capsule Take 1 capsule (12.5 mg total) by mouth daily.  [name] ibuprofen (ADVIL) 800 MG tablet Take 1 tablet (800 mg total) by mouth every 8 (eight) hours as needed.  . levETIRAcetam (KEPPRA) 500 MG tablet Take 1 tablet (500 mg total) by mouth 2 (two) times daily.  . Magnesium 250 MG TABS Take by mouth.  . Multiple Vitamin (MULTIVITAMIN WITH MINERALS) TABS tablet Take 1 tablet by mouth daily.  . pramipexole (MIRAPEX) 0.5 MG tablet Take 1 tablet (0.5 mg total) by mouth at bedtime as needed.  . temazepam (RESTORIL) 15 MG capsule TAKE 1-2 AT BEDTIME AS NEEDED FOR SLEEP  . Vilazodone HCl (VIIBRYD) 40 MG TABS Take 1 tablet (40 mg total) by mouth daily.  . vitamin B-12 (CYANOCOBALAMIN) 1000 MCG tablet Take 1,000 mcg by mouth daily.  [name] albuterol (PROVENTIL HFA;VENTOLIN HFA) 108 (90 Base) MCG/ACT inhaler Inhale 2 puffs into the lungs every 6 (six) hours as needed for wheezing or shortness of breath. (Patient not taking: Reported on 08/09/2019)   No facility-administered encounter medications on file as of 08/09/2019.     Current Diagnosis/Assessment:  SDOH Interventions  Most Recent Value  SDOH Interventions  Social Connections Interventions Other (Comment)  [recommend referral to psychiatry for coping]      Goals Addressed            This Visit's Progress   . Pharmacy Care Plan       CARE PLAN ENTRY (see longitudinal plan of care for additional care plan information)  Current Barriers:  . Chronic Disease Management support, education, and  care coordination needs related to Hypertension, Hyperlipidemia, and Depression   Hypertension BP Readings from Last 3 Encounters:  05/24/19 (!) 148/66  05/16/19 124/72  04/26/19 117/73 .  Pharmacist Clinical Goal(s): o Over the next 90 days, patient will work with PharmD and providers to maintain BP goal <130/80 . Current regimen:  o Hydrochlorothiazide 12.5 mg daily . Interventions: o Discussed BP goals and benefits of medication for prevention of heart attack / stroke . Patient self care activities - Over the next 90 days, patient will: o Check BP as needed, document, and provide at future appointments o Ensure daily salt intake < 2300 mg/day  Hyperlipidemia / CAD / TIA Lab Results  Component Value Date/Time   LDLCALC 39 10/12/2018 03:44 AM .  Pharmacist Clinical Goal(s): o Over the next 90 days, patient will work with PharmD and providers to maintain LDL goal < 70 . Current regimen:  o Atorvastatin 80 mg at bedtime o Aspirin 81 mg daily . Interventions: o Discussed cholesterol goals and benefits of medication for prevention of heart attack / stroke . Patient self care activities - Over the next 90 days, patient will: o Continue medication as prescribed  Depression . Pharmacist Clinical Goal(s) o Over the next 90 days, patient will work with PharmD and providers to optimize therapy . Current regimen:  o Viibyrd 40 mg daily o Temazepam 15 mg at bedtime . Interventions: o Discussed benefits of therapy; recommend referral to psychiatry . Patient self care activities - Over the next 90 days, patient will: o Schedule psychiatry appt pending referral  Medication management . Pharmacist Clinical Goal(s): o Over the next 90 days, patient will work with PharmD and providers to maintain optimal medication adherence . Current pharmacy: [facility] mail order . Interventions o Comprehensive medication review performed. o Continue current medication management strategy . Patient  self care activities - Over the next 90 days, patient will: o Focus on medication adherence by fill date o Take medications as prescribed o Report any questions or concerns to PharmD and/or provider(s)  Initial goal documentation       Hypertension   BP goal is:  <130/80  Office blood pressures are  BP Readings from Last 3 Encounters:  05/24/19 (!) 148/66  05/16/19 124/72  04/26/19 117/73   Patient checks BP at home infrequently Patient home BP readings are ranging: n/a  Patient has failed these meds in the past: triamterene-HCTZ, Patient is currently controlled on the following medications:  . HCTZ 12.5 mg daily  We discussed BP goals; pt reports she has very little appetite but denies issues/side effects  Plan  Continue current medications     Hyperlipidemia / CAD   NSTEMI 2014, TIA 2015 LDL goal < 70  Lipid Panel     Component Value Date/Time   CHOL 111 10/12/2018 0344   TRIG 81 10/12/2018 0344   HDL 56 10/12/2018 0344   LDLCALC 39 10/12/2018 0344    Hepatic Function Latest Ref Rng & Units 02/28/2019 11/30/2018 10/11/2018  Total Protein 6.0 - 8.3 g/dL 7.1  6.6 6.7  Albumin 3.5 - 5.2 g/dL 4.1 3.7 3.6  AST 0 - 37 U/L _0 ALT 0 - 35 U/L _1 Alk Phosphatase 39 - 117 U/L 76 67 66  Total Bilirubin 0.2 - 1.2 mg/dL 0.6 0.8 0.3  Bilirubin, Direct 0.0 - 0.3 mg/dL - - -     The ASCVD Risk score Mikey Bussing DC Jr., et al., 2013) failed to calculate for the following reasons:   The patient has a prior MI or stroke diagnosis   Patient has failed these meds in past: n/a Patient is currently controlled on the following medications:  . Atorvastatin 80 mg HS . Aspirin 81 mg daily  We discussed:  Cholesterol goals; benefits of statin for ASCVD risk reduction  Plan  Continue current medications   Depression / PTSD   Depression screen [facility] 2/9 01/12/2019 07/21/2018 12/02/2016  Decreased Interest _2 Down, Depressed, Hopeless 1 0 3  PHQ - 2 Score _3 Altered sleeping _4 Tired, decreased energy _5 Change in appetite _6 Feeling bad or failure about yourself  _7 Trouble concentrating 2 0 3  Moving slowly or fidgety/restless 3 0 2  Suicidal thoughts 1 0 1  PHQ-9 Score _8 Difficult doing work/chores - - -  Some recent data might be hidden   Patient has failed these meds in past: bupropion, duloxetine, escitalopram, fluoxetine, mirtazapine, sertraline, trazodone, [place] Patient is currently uncontrolled on the following medications:  [name] Viibryd 40 mg daily . Temazepam 15 mg HS  We discussed:  Pt reports Viibryd has not been helping at all for last couple months. She reports she is crying all the time. She stays in bed all day until she has to go to work. She reports very little appetite, currently only eating when she takes her meds. She reports no medication she has tried has helped much. Pt has not tried Trintellix, which is in the same formulary tier as Viibryd, this may be an option in the future.  She would like to try therapy again - she previously had therapy with "Dr Lorenda Ishihara" who pt reports was not a good match for her. She requests a referral to a psychiatrist/therapist that she would not have to pay for.  Plan  Continue current medications  Request referral to psychiatry for therapy/medication   Seizure disorder   Patient has failed these meds in past: n/a Patient is currently controlled on the following medications:  . Levetiracetam 500 mg BID  We discussed:  Pt endorses compliance and denies seizures  Plan  Continue current medications  RLS   Patient has failed these meds in past: topiramate (cognitive SE) Patient is currently controlled on the following medications:  . Gabapentin 300 mg HS . Pramipexole 0.5 mg HS prn  We discussed: Pt reports leg pain is somewhat improved after she received spinal injections a few weeks ago. She is tolerating current medications  Plan  Continue current  medications  Health Maintenance   Patient is currently controlled on the following medications:  [name] Magnesium 250 mg  . Multivitamin . Ferrous sulfate 324 mg . Albuterol HFA prn . Ibuprofen 800 mg q8h PRN . Vitamin D . Vitamin B12  We discussed:  Patient is satisfied with current OTC regimen and denies issues  Plan  Continue current medications  Medication Management   Pt uses [facility] mail  order pharmacy for all medications Uses pill box? No - prefers bottles Pt endorses 100% compliance  We discussed: All medications are free through [facility]. Pt is satisfied with pharmacy services.  Plan  Continue current medication management strategy   Follow up: 3 month phone visit  Charlene Brooke, PharmD Clinical Pharmacist [facility] [phone]

## 2019-08-09 NOTE — Patient Instructions (Addendum)
Visit Information  There are several Psychiatry/Counseling groups in the area that take your insurance. You may choose whichever you like and call to set up an appointment at your convenience:  [facility] [address], [place], [place] [address] Call: [facility] [address], [place], [place] [address] Call: [place] [facility]) [address], [place], [place] [address] Call: [phone]  [facility] [address], [place], [place] [address] Call: [phone]   Goals Addressed            This Visit's Progress   . Pharmacy Care Plan       CARE PLAN ENTRY (see longitudinal plan of care for additional care plan information)  Current Barriers:  . Chronic Disease Management support, education, and care coordination needs related to Hypertension, Hyperlipidemia, and Depression   Hypertension BP Readings from Last 3 Encounters:  05/24/19 (!) 148/66  05/16/19 124/72  04/26/19 117/73 .  Pharmacist Clinical Goal(s): o Over the next 90 days, patient will work with PharmD and providers to maintain BP goal <130/80 . Current regimen:  o Hydrochlorothiazide 12.5 mg daily . Interventions: o Discussed BP goals and benefits of medication for prevention of heart attack / stroke . Patient self care activities - Over the next 90 days, patient will: o Check BP as needed, document, and provide at future appointments o Ensure daily salt intake < 2300 mg/day  Hyperlipidemia / CAD / TIA Lab Results  Component Value Date/Time   LDLCALC 39 10/12/2018 03:44 AM .  Pharmacist Clinical Goal(s): o Over the next 90 days, patient will work with PharmD and providers to maintain LDL goal < 70 . Current regimen:  o Atorvastatin 80 mg at bedtime o Aspirin 81 mg daily . Interventions: o Discussed cholesterol goals and benefits of medication for prevention of heart attack /  stroke . Patient self care activities - Over the next 90 days, patient will: o Continue medication as prescribed  Depression . Pharmacist Clinical Goal(s) o Over the next 90 days, patient will work with PharmD and providers to optimize therapy . Current regimen:  o Viibyrd 40 mg daily o Temazepam 15 mg at bedtime . Interventions: o Discussed benefits of therapy; recommend referral to psychiatry . Patient self care activities - Over the next 90 days, patient will: o Schedule psychiatry appt pending referral  Medication management . Pharmacist Clinical Goal(s): o Over the next 90 days, patient will work with PharmD and providers to maintain optimal medication adherence . Current pharmacy: [facility] mail order . Interventions o Comprehensive medication review performed. o Continue current medication management strategy . Patient self care activities - Over the next 90 days, patient will: o Focus on medication adherence by fill date o Take medications as prescribed o Report any questions or concerns to PharmD and/or provider(s)  Initial goal documentation       Anna Lyons was given information about Chronic Care Management services today including:  1. CCM service includes personalized support from designated clinical staff supervised by her physician, including individualized plan of care and coordination with other care providers 2. 24/7 contact phone numbers for assistance for urgent and routine care needs. 3. Standard insurance, coinsurance, copays and deductibles apply for chronic care management only during months in which we provide at least 20 minutes of these services. Most insurances cover these services at 100%, however patients may be responsible for any copay, coinsurance and/or deductible if applicable.  This service may help you avoid the need for more expensive face-to-face services. 4. Only one practitioner may furnish and bill the service in a calendar month. 5. The  patient may stop CCM services at any time (effective at the end of the month) by phone call to the office staff.  Patient agreed to services and verbal consent obtained.   Patient verbalizes understanding of instructions provided today.  Telephone follow up appointment with pharmacy team member scheduled for: 3 months  Charlene Brooke, PharmD Clinical Pharmacist [facility] [phone]    Living With Depression Everyone experiences occasional disappointment, sadness, and loss in their lives. When you are feeling down, blue, or sad for at least 2 weeks in a row, it may mean that you have depression. Depression can affect your thoughts and feelings, relationships, daily activities, and physical health. It is caused by changes in the way your brain functions. If you receive a diagnosis of depression, your health care provider will tell you which type of depression you have and what treatment options are available to you. If you are living with depression, there are ways to help you recover from it and also ways to prevent it from coming back. How to cope with lifestyle changes Coping with stress     Stress is your body's reaction to life changes and events, both good and bad. Stressful situations may include:  Getting married.  The death of a spouse.  Losing a job.  Retiring.  Having a baby. Stress can last just a few hours or it can be ongoing. Stress can play a major role in depression, so it is important to learn both how to cope with stress and how to think about it differently. Talk with your health care provider or a counselor if you would like to learn more about stress reduction. He or she may suggest some stress reduction techniques, such as:  Music therapy. This can include creating music or listening to music. Choose music that you enjoy and that inspires you.  Mindfulness-based meditation. This kind of meditation can be done while sitting or  walking. It involves being aware of your normal breaths, rather than trying to control your breathing.  Centering prayer. This is a kind of meditation that involves focusing on a spiritual word or phrase. Choose a word, phrase, or sacred image that is meaningful to you and that brings you peace.  Deep breathing. To do this, expand your stomach and inhale slowly through your nose. Hold your breath for 3-5 seconds, then exhale slowly, allowing your stomach muscles to relax.  Muscle relaxation. This involves intentionally tensing muscles then relaxing them. Choose a stress reduction technique that fits your lifestyle and personality. Stress reduction techniques take time and practice to develop. Set aside 5-15 minutes a day to do them. Therapists can offer training in these techniques. The training may be covered by some insurance plans. Other things you can do to manage stress include:  Keeping a stress diary. This can help you learn what triggers your stress and ways to control your response.  Understanding what your limits are and saying no to requests or events that lead to a schedule that is too full.  Thinking about how you respond to certain situations. You may not be able to control everything, but you can control how you react.  Adding humor to your life by watching funny films or TV shows.  Making time for activities that help you relax and  not feeling guilty about spending your time this way.  Medicines Your health care provider may suggest certain medicines if he or she feels that they will help improve your condition. Avoid using alcohol and other substances that may prevent your medicines from working properly (may interact). It is also important to:  Talk with your pharmacist or health care provider about all the medicines that you take, their possible side effects, and what medicines are safe to take together.  Make it your goal to take part in all treatment decisions (shared  decision-making). This includes giving input on the side effects of medicines. It is best if shared decision-making with your health care provider is part of your total treatment plan. If your health care provider prescribes a medicine, you may not notice the full benefits of it for 4-8 weeks. Most people who are treated for depression need to be on medicine for at least 6-12 months after they feel better. If you are taking medicines as part of your treatment, do not stop taking medicines without first talking to your health care provider. You may need to have the medicine slowly decreased (tapered) over time to decrease the risk of harmful side effects. Relationships Your health care provider may suggest family therapy along with individual therapy and drug therapy. While there may not be family problems that are causing you to feel depressed, it is still important to make sure your family learns as much as they can about your mental health. Having your family's support can help make your treatment successful. How to recognize changes in your condition Everyone has a different response to treatment for depression. Recovery from major depression happens when you have not had signs of major depression for two months. This may mean that you will start to:  Have more interest in doing activities.  Feel less hopeless than you did 2 months ago.  Have more energy.  Overeat less often, or have better or improving appetite.  Have better concentration. Your health care provider will work with you to decide the next steps in your recovery. It is also important to recognize when your condition is getting worse. Watch for these signs:  Having fatigue or low energy.  Eating too much or too little.  Sleeping too much or too little.  Feeling restless, agitated, or hopeless.  Having trouble concentrating or making decisions.  Having unexplained physical complaints.  Feeling irritable, angry, or  aggressive. Get help as soon as you or your family members notice these symptoms coming back. How to get support and help from others How to talk with friends and family members about your condition  Talking to friends and family members about your condition can provide you with one way to get support and guidance. Reach out to trusted friends or family members, explain your symptoms to them, and let them know that you are working with a health care provider to treat your depression. Financial resources Not all insurance plans cover mental health care, so it is important to check with your insurance carrier. If paying for co-pays or counseling services is a problem, search for a local or county mental health care center. They may be able to offer public mental health care services at low or no cost when you are not able to see a private health care provider. If you are taking medicine for depression, you may be able to get the generic form, which may be less expensive. Some makers of prescription medicines also offer  help to patients who cannot afford the medicines they need. Follow these instructions at home:   Get the right amount and quality of sleep.  Cut down on using caffeine, tobacco, alcohol, and other potentially harmful substances.  Try to exercise, such as walking or lifting small weights.  Take over-the-counter and prescription medicines only as told by your health care provider.  Eat a healthy diet that includes plenty of vegetables, fruits, whole grains, low-fat dairy products, and lean protein. Do not eat a lot of foods that are high in solid fats, added sugars, or salt.  Keep all follow-up visits as told by your health care provider. This is important. Contact a health care provider if:  You stop taking your antidepressant medicines, and you have any of these symptoms: ? Nausea. ? Headache. ? Feeling lightheaded. ? Chills and body aches. ? Not being able to sleep  (insomnia).  You or your friends and family think your depression is getting worse. Get help right away if:  You have thoughts of hurting yourself or others. If you ever feel like you may hurt yourself or others, or have thoughts about taking your own life, get help right away. You can go to your nearest emergency department or call:  Your local emergency services (911 in the [place]  A suicide crisis helpline, such as the [place] at 4388657147. This is open 24-hours a day. Summary  If you are living with depression, there are ways to help you recover from it and also ways to prevent it from coming back.  Work with your health care team to create a management plan that includes counseling, stress management techniques, and healthy lifestyle habits. This information is not intended to replace advice given to you by your health care provider. Make sure you discuss any questions you have with your health care provider. Document Revised: 05/12/2018 Document Reviewed: 12/22/2015 Elsevier Patient Education  [place].

## 2019-09-03 ENCOUNTER — Other Ambulatory Visit: Payer: Self-pay | Admitting: Internal Medicine

## 2019-09-19 ENCOUNTER — Ambulatory Visit (INDEPENDENT_AMBULATORY_CARE_PROVIDER_SITE_OTHER): Payer: Medicare HMO | Admitting: Psychology

## 2019-09-19 DIAGNOSIS — F3289 Other specified depressive episodes: Secondary | ICD-10-CM | POA: Diagnosis not present

## 2019-09-24 ENCOUNTER — Other Ambulatory Visit: Payer: Self-pay | Admitting: Internal Medicine

## 2019-09-26 ENCOUNTER — Ambulatory Visit (INDEPENDENT_AMBULATORY_CARE_PROVIDER_SITE_OTHER): Payer: Medicare HMO | Admitting: Psychology

## 2019-09-26 DIAGNOSIS — F3289 Other specified depressive episodes: Secondary | ICD-10-CM

## 2019-10-01 DIAGNOSIS — M5134 Other intervertebral disc degeneration, thoracic region: Secondary | ICD-10-CM | POA: Diagnosis not present

## 2019-10-01 DIAGNOSIS — M9901 Segmental and somatic dysfunction of cervical region: Secondary | ICD-10-CM | POA: Diagnosis not present

## 2019-10-01 DIAGNOSIS — M9903 Segmental and somatic dysfunction of lumbar region: Secondary | ICD-10-CM | POA: Diagnosis not present

## 2019-10-01 DIAGNOSIS — M5032 Other cervical disc degeneration, mid-cervical region, unspecified level: Secondary | ICD-10-CM | POA: Diagnosis not present

## 2019-10-01 DIAGNOSIS — M5136 Other intervertebral disc degeneration, lumbar region: Secondary | ICD-10-CM | POA: Diagnosis not present

## 2019-10-01 DIAGNOSIS — M9902 Segmental and somatic dysfunction of thoracic region: Secondary | ICD-10-CM | POA: Diagnosis not present

## 2019-10-04 ENCOUNTER — Ambulatory Visit (INDEPENDENT_AMBULATORY_CARE_PROVIDER_SITE_OTHER): Payer: Medicare HMO | Admitting: Psychology

## 2019-10-04 DIAGNOSIS — F3289 Other specified depressive episodes: Secondary | ICD-10-CM

## 2019-10-10 ENCOUNTER — Ambulatory Visit (INDEPENDENT_AMBULATORY_CARE_PROVIDER_SITE_OTHER): Payer: Medicare HMO | Admitting: Psychology

## 2019-10-10 DIAGNOSIS — F320 Major depressive disorder, single episode, mild: Secondary | ICD-10-CM | POA: Diagnosis not present

## 2019-10-11 ENCOUNTER — Other Ambulatory Visit: Payer: Self-pay

## 2019-10-11 ENCOUNTER — Ambulatory Visit (INDEPENDENT_AMBULATORY_CARE_PROVIDER_SITE_OTHER): Payer: Medicare HMO | Admitting: Internal Medicine

## 2019-10-11 ENCOUNTER — Encounter: Payer: Self-pay | Admitting: Internal Medicine

## 2019-10-11 VITALS — BP 118/72 | HR 71 | Temp 98.6°F | Ht 63.0 in | Wt 129.0 lb

## 2019-10-11 DIAGNOSIS — E538 Deficiency of other specified B group vitamins: Secondary | ICD-10-CM | POA: Insufficient documentation

## 2019-10-11 DIAGNOSIS — F333 Major depressive disorder, recurrent, severe with psychotic symptoms: Secondary | ICD-10-CM | POA: Diagnosis not present

## 2019-10-11 DIAGNOSIS — R634 Abnormal weight loss: Secondary | ICD-10-CM

## 2019-10-11 DIAGNOSIS — Z1159 Encounter for screening for other viral diseases: Secondary | ICD-10-CM | POA: Insufficient documentation

## 2019-10-11 DIAGNOSIS — Z23 Encounter for immunization: Secondary | ICD-10-CM | POA: Diagnosis not present

## 2019-10-11 DIAGNOSIS — Z720 Tobacco use: Secondary | ICD-10-CM

## 2019-10-11 DIAGNOSIS — E785 Hyperlipidemia, unspecified: Secondary | ICD-10-CM | POA: Diagnosis not present

## 2019-10-11 DIAGNOSIS — I10 Essential (primary) hypertension: Secondary | ICD-10-CM

## 2019-10-11 DIAGNOSIS — Z0000 Encounter for general adult medical examination without abnormal findings: Secondary | ICD-10-CM

## 2019-10-11 MED ORDER — ARIPIPRAZOLE 2 MG PO TABS: 2.0000 mg | ORAL_TABLET | Freq: Every day | ORAL | 0 refills | Status: DC

## 2019-10-11 MED ORDER — VIIBRYD STARTER PACK 10 & 20 MG PO KIT: 1.0000 | PACK | Freq: Every day | ORAL | 0 refills | Status: DC

## 2019-10-11 MED ORDER — SHINGRIX 50 MCG/0.5ML IM SUSR: 0.5000 mL | Freq: Once | INTRAMUSCULAR | 1 refills | Status: AC

## 2019-10-11 MED ORDER — DULOXETINE HCL 30 MG PO CPEP: 30.0000 mg | ORAL_CAPSULE | Freq: Every day | ORAL | 0 refills | Status: DC

## 2019-10-11 NOTE — Patient Instructions (Signed)
Major Depressive Disorder, Adult Major depressive disorder (MDD) is a mental health condition. It may also be called clinical depression or unipolar depression. MDD usually causes feelings of sadness, hopelessness, or helplessness. MDD can also cause physical symptoms. It can interfere with work, school, relationships, and other everyday activities. MDD may be mild, moderate, or severe. It may occur once (single episode major depressive disorder) or it may occur multiple times (recurrent major depressive disorder). What are the causes? The exact cause of this condition is not known. MDD is most likely caused by a combination of things, which may include:  Genetic factors. These are traits that are passed along from parent to child.  Individual factors. Your personality, your behavior, and the way you handle your thoughts and feelings may contribute to MDD. This includes personality traits and behaviors learned from others.  Physical factors, such as: ? Differences in the part of your brain that controls emotion. This part of your brain may be different than it is in people who do not have MDD. ? Long-term (chronic) medical or psychiatric illnesses.  Social factors. Traumatic experiences or major life changes may play a role in the development of MDD. What increases the risk? This condition is more likely to develop in women. The following factors may also make you more likely to develop MDD:  A family history of depression.  Troubled family relationships.  Abnormally low levels of certain brain chemicals.  Traumatic events in childhood, especially abuse or the loss of a parent.  Being under a lot of stress, or long-term stress, especially from upsetting life experiences or losses.  A history of: ? Chronic physical illness. ? Other mental health disorders. ? Substance abuse.  Poor living conditions.  Experiencing social exclusion or discrimination on a regular basis. What are the  signs or symptoms? The main symptoms of MDD typically include:  Constant depressed or irritable mood.  Loss of interest in things and activities. MDD symptoms may also include:  Sleeping or eating too much or too little.  Unexplained weight change.  Fatigue or low energy.  Feelings of worthlessness or guilt.  Difficulty thinking clearly or making decisions.  Thoughts of suicide or of harming others.  Physical agitation or weakness.  Isolation. Severe cases of MDD may also occur with other symptoms, such as:  Delusions or hallucinations, in which you imagine things that are not real (psychotic depression).  Low-level depression that lasts at least a year (chronic depression or persistent depressive disorder).  Extreme sadness and hopelessness (melancholic depression).  Trouble speaking and moving (catatonic depression). How is this diagnosed? This condition may be diagnosed based on:  Your symptoms.  Your medical history, including your mental health history. This may involve tests to evaluate your mental health. You may be asked questions about your lifestyle, including any drug and alcohol use, and how long you have had symptoms of MDD.  A physical exam.  Blood tests to rule out other conditions. You must have a depressed mood and at least four other MDD symptoms most of the day, nearly every day in the same 2-week timeframe before your health care provider can confirm a diagnosis of MDD. How is this treated? This condition is usually treated by mental health professionals, such as psychologists, psychiatrists, and clinical social workers. You may need more than one type of treatment. Treatment may include:  Psychotherapy. This is also called talk therapy or counseling. Types of psychotherapy include: ? Cognitive behavioral therapy (CBT). This type of therapy   teaches you to recognize unhealthy feelings, thoughts, and behaviors, and replace them with positive thoughts  and actions. ? Interpersonal therapy (IPT). This helps you to improve the way you relate to and communicate with others. ? Family therapy. This treatment includes members of your family.  Medicine to treat anxiety and depression, or to help you control certain emotions and behaviors.  Lifestyle changes, such as: ? Limiting alcohol and drug use. ? Exercising regularly. ? Getting plenty of sleep. ? Making healthy eating choices. ? Spending more time outdoors.  Treatments involving stimulation of the brain can be used in situations with extremely severe symptoms, or when medicine or other therapies do not work over time. These treatments include electroconvulsive therapy, transcranial magnetic stimulation, and vagal nerve stimulation. Follow these instructions at home: Activity  Return to your normal activities as told by your health care provider.  Exercise regularly and spend time outdoors as told by your health care provider. General instructions  Take over-the-counter and prescription medicines only as told by your health care provider.  Do not drink alcohol. If you drink alcohol, limit your alcohol intake to no more than 1 drink a day for nonpregnant women and 2 drinks a day for men. One drink equals 12 oz of beer, 5 oz of wine, or 1 oz of hard liquor. Alcohol can affect any antidepressant medicines you are taking. Talk to your health care provider about your alcohol use.  Eat a healthy diet and get plenty of sleep.  Find activities that you enjoy doing, and make time to do them.  Consider joining a support group. Your health care provider may be able to recommend a support group.  Keep all follow-up visits as told by your health care provider. This is important. Where to find more information National Alliance on Mental Illness  www.nami.org [place] National Institute of Mental Health  www.nimh.nih.gov National Suicide Prevention Lifeline  1-800-273-TALK (8255). This is  free, 24-hour help. Contact a health care provider if:  Your symptoms get worse.  You develop new symptoms. Get help right away if:  You self-harm.  You have serious thoughts about hurting yourself or others.  You see, hear, taste, smell, or feel things that are not present (hallucinate). This information is not intended to replace advice given to you by your health care provider. Make sure you discuss any questions you have with your health care provider. Document Revised: 12/31/2016 Document Reviewed: 07/30/2015 Elsevier Patient Education  2020 [employer]  

## 2019-10-11 NOTE — Progress Notes (Signed)
Subjective:  Patient ID: Anna Lyons, female    DOB: Jul 17, 1955  Age: 64 y.o. MRN: [id number]  CC: Annual Exam  This visit occurred during the SARS-CoV-2 public health emergency.  Safety protocols were in place, including screening questions prior to the visit, additional usage of staff PPE, and extensive cleaning of exam room while observing appropriate contact time as indicated for disinfecting solutions.    HPI Anna Lyons presents for a CPX.  She wants to stop taking Viibryd.  She has taken it for a year and she says is not helping her.  She also says her insurance does not pay for it and is too expensive for her to buy anymore.  She has a complex neuropsychiatric history that includes PTSD.  She previously tried sertraline without much improvement.  She complains of crying spells, insomnia, fatigue, anhedonia, anxiety, feeling worthless/helpless/hopeless, and thoughts of death.  She does not have a plan for suicide or homicide.  She has good support with her daughter and a brother and sister.  Her husband died years ago.  Outpatient Medications Prior to Visit  Medication Sig Dispense Refill  . albuterol (PROVENTIL HFA;VENTOLIN HFA) 108 (90 Base) MCG/ACT inhaler Inhale 2 puffs into the lungs every 6 (six) hours as needed for wheezing or shortness of breath. 1 Inhaler 0  . aspirin EC 81 MG tablet Take 81 mg by mouth daily.    [name] atorvastatin (LIPITOR) 80 MG tablet Take 1 tablet (80 mg total) by mouth at bedtime. 90 tablet 3  . baclofen (LIORESAL) 20 MG tablet TAKE 1 TABLET THREE TIMES DAILY 270 tablet 1  . cholecalciferol (VITAMIN D3) 25 MCG (1000 UNIT) tablet Take 1,000 Units by mouth daily.    . ferrous sulfate 324 MG TBEC Take 324 mg by mouth.    . gabapentin (NEURONTIN) 300 MG capsule Take 1 capsule (300 mg total) by mouth at bedtime. 30 capsule 0  . levETIRAcetam (KEPPRA) 500 MG tablet TAKE 1 TABLET (500 MG TOTAL) BY MOUTH 2 (TWO) TIMES DAILY. 180 tablet 1  . Magnesium 250 MG TABS  Take by mouth.    . Multiple Vitamin (MULTIVITAMIN WITH MINERALS) TABS tablet Take 1 tablet by mouth daily.    . pramipexole (MIRAPEX) 0.5 MG tablet Take 1 tablet (0.5 mg total) by mouth at bedtime as needed. 90 tablet 3  . temazepam (RESTORIL) 15 MG capsule TAKE 1-2 AT BEDTIME AS NEEDED FOR SLEEP 60 capsule 3  . vitamin B-12 (CYANOCOBALAMIN) 1000 MCG tablet Take 1,000 mcg by mouth daily.    [name] gabapentin (NEURONTIN) 300 MG capsule Take 1 capsule (300 mg total) by mouth at bedtime. 90 capsule 3  . hydrochlorothiazide (MICROZIDE) 12.5 MG capsule Take 1 capsule (12.5 mg total) by mouth daily. 90 capsule 3  . ibuprofen (ADVIL) 800 MG tablet Take 1 tablet (800 mg total) by mouth every 8 (eight) hours as needed. 270 tablet 1  . VIIBRYD 40 MG TABS TAKE 1 TABLET (40 MG TOTAL) BY MOUTH DAILY. 90 tablet 1   No facility-administered medications prior to visit.    ROS Review of Systems  Constitutional: Positive for fatigue and unexpected weight change (wt loss). Negative for appetite change, chills and diaphoresis.  HENT: Negative.   Eyes: Negative for visual disturbance.  Respiratory: Negative for cough, chest tightness, shortness of breath and wheezing.   Cardiovascular: Negative for palpitations and leg swelling.  Gastrointestinal: Negative for abdominal pain, blood in stool, constipation, diarrhea, nausea and vomiting.  Endocrine: Negative.  Genitourinary: Negative.  Negative for difficulty urinating and dysuria.  Musculoskeletal: Negative.  Negative for arthralgias, back pain, myalgias and neck pain.  Neurological: Negative.  Negative for dizziness, weakness, light-headedness, numbness and headaches.  Hematological: Negative for adenopathy. Does not bruise/bleed easily.  Psychiatric/Behavioral: Positive for dysphoric mood and sleep disturbance. Negative for agitation, behavioral problems, confusion, decreased concentration, hallucinations, self-injury and suicidal ideas. The patient is  nervous/anxious. The patient is not hyperactive.     Objective:  BP 118/72   Pulse 71   Temp 98.6 F (37 C) (Oral)   Ht 5' 3" (1.6 m)   Wt 129 lb (58.5 kg)   SpO2 93%   BMI 22.85 kg/m   BP Readings from Last 3 Encounters:  10/11/19 118/72  05/24/19 (!) 148/66  05/16/19 124/72    Wt Readings from Last 3 Encounters:  10/11/19 129 lb (58.5 kg)  05/24/19 136 lb (61.7 kg)  05/16/19 135 lb 12.8 oz (61.6 kg)    Physical Exam Vitals reviewed.  Constitutional:      Appearance: She is not toxic-appearing or diaphoretic.  HENT:     Nose: Nose normal.     Mouth/Throat:     Mouth: Mucous membranes are moist.  Eyes:     General: No scleral icterus.    Conjunctiva/sclera: Conjunctivae normal.  Cardiovascular:     Rate and Rhythm: Normal rate and regular rhythm.     Heart sounds: No murmur heard.   Pulmonary:     Effort: Pulmonary effort is normal.     Breath sounds: No stridor. No wheezing, rhonchi or rales.  Abdominal:     General: Abdomen is flat. Bowel sounds are normal. There is no distension.     Palpations: Abdomen is soft. There is no hepatomegaly, splenomegaly or mass.     Tenderness: There is no abdominal tenderness.  Musculoskeletal:        General: Normal range of motion.     Cervical back: Neck supple.     Right lower leg: No edema.     Left lower leg: No edema.  Lymphadenopathy:     Cervical: No cervical adenopathy.  Skin:    General: Skin is warm and dry.     Findings: No lesion.  Neurological:     General: No focal deficit present.     Mental Status: She is alert. Mental status is at baseline.  Psychiatric:        Attention and Perception: Attention and perception normal.        Mood and Affect: Mood is depressed. Mood is not anxious. Affect is flat and tearful. Affect is not angry or inappropriate.        Speech: Speech normal. She is communicative. Speech is not rapid and pressured, delayed, slurred or tangential.        Behavior: Behavior is not  agitated, slowed, aggressive or withdrawn. Behavior is cooperative.        Thought Content: Thought content normal. Thought content is not paranoid. Thought content does not include homicidal or suicidal ideation. Thought content does not include homicidal or suicidal plan.        Cognition and Memory: Cognition normal.        Judgment: Judgment normal.     Lab Results  Component Value Date   WBC 4.6 10/11/2019   HGB 12.8 10/11/2019   HCT 38.2 10/11/2019   PLT 189 10/11/2019   GLUCOSE 91 10/11/2019   CHOL 118 10/11/2019   TRIG 61 10/11/2019  HDL 59 10/11/2019   LDLCALC 45 10/11/2019   ALT 17 10/11/2019   AST 24 10/11/2019   NA 138 10/11/2019   K 3.8 10/11/2019   CL 101 10/11/2019   CREATININE 0.74 10/11/2019   BUN 17 10/11/2019   CO2 26 10/11/2019   TSH 1.96 10/11/2019   INR 1.0 10/11/2018   HGBA1C 5.3 10/12/2018    VAS [place] ABI WITH/WO TBI  Result Date: 06/14/2019 LOWER EXTREMITY DOPPLER STUDY Indications: Numbness. High Risk Factors: Hypertension, current smoker, prior CVA.  Performing Technologist: Ronal Fear RVS, RCS  Examination Guidelines: A complete evaluation includes at minimum, Doppler waveform signals and systolic blood pressure reading at the level of bilateral brachial, anterior tibial, and posterior tibial arteries, when vessel segments are accessible. Bilateral testing is considered an integral part of a complete examination. Photoelectric Plethysmograph (PPG) waveforms and toe systolic pressure readings are included as required and additional duplex testing as needed. Limited examinations for reoccurring indications may be performed as noted.  ABI Findings: +---------+------------------+-----+---------+--------+ Right    Rt Pressure (mmHg)IndexWaveform Comment  +---------+------------------+-----+---------+--------+ Brachial 130                                      +---------+------------------+-----+---------+--------+ PTA      151                1.16 triphasic         +---------+------------------+-----+---------+--------+ DP       134               1.03 triphasic         +---------+------------------+-----+---------+--------+ Great Toe77                0.59                   +---------+------------------+-----+---------+--------+ +---------+------------------+-----+---------+-------+ Left     Lt Pressure (mmHg)IndexWaveform Comment +---------+------------------+-----+---------+-------+ Brachial 125                                     +---------+------------------+-----+---------+-------+ PTA      135               1.04 triphasic        +---------+------------------+-----+---------+-------+ DP       131               1.01 triphasic        +---------+------------------+-----+---------+-------+ Great Toe89                0.68                  +---------+------------------+-----+---------+-------+ +-------+-----------+-----------+------------+------------+ ABI/TBIToday's ABIToday's TBIPrevious ABIPrevious TBI +-------+-----------+-----------+------------+------------+ Right  1.16       0.59       1.08        0.70         +-------+-----------+-----------+------------+------------+ Left   1.04       0.68       1.09        0.54         +-------+-----------+-----------+------------+------------+ Bilateral ABIs appear essentially unchanged compared to prior study on 03/07/2019.  Summary: Right: Resting right ankle-brachial index is within normal range. No evidence of significant right lower extremity arterial disease. The right toe-brachial index is abnormal. Left: Resting left ankle-brachial index is within normal range. No evidence  of significant left lower extremity arterial disease. The left toe-brachial index is abnormal.  *See table(s) above for measurements and observations.  Electronically signed by Ruta Hinds MD on 06/14/2019 at 8:37:18 AM.   Final     Assessment & Plan:   Anna Lyons was  seen today for annual exam.  Diagnoses and all orders for this visit:  Essential hypertension, benign- Her systolic blood pressure is down to 118.  I recommended that she stop taking hydrochlorothiazide. -     CBC with Differential/Platelet; Future -     TSH; Future -     Urinalysis, Routine w reflex microscopic; Future -     BASIC METABOLIC PANEL WITH GFR; Future -     BASIC METABOLIC PANEL WITH GFR -     Urinalysis, Routine w reflex microscopic -     TSH -     CBC with Differential/Platelet  Routine general medical examination at a health care facility- Exam completed, labs reviewed, vaccines reviewed and updated, cancer screenings are up-to-date, patient education was given.  Weight loss  Tobacco abuse -     Ambulatory Referral for Lung Cancer Scre  Dyslipidemia, goal LDL below 70- She has achieved her LDL goal and is doing well on the statin. -     Lipid panel; Future -     TSH; Future -     Hepatic function panel; Future -     Hepatic function panel -     TSH -     Lipid panel  B12 deficiency- Her H&H, B12, and folate levels are normal now. -     CBC with Differential/Platelet; Future -     Vitamin B12; Future -     Folate; Future -     Folate -     Vitamin B12 -     CBC with Differential/Platelet  Need for hepatitis C screening test -     Hepatitis C antibody; Future -     Hepatitis C antibody  Need for shingles vaccine -     Zoster Vaccine Adjuvanted [facility]) injection; Inject 0.5 mLs into the muscle once for 1 dose.  Severe episode of recurrent major depressive disorder, with psychotic features ([place])- She is not willing to pay for Viibryd anymore.  I recommended that she start duloxetine and an atypical antipsychotic - will increase the doses over time.  I gave her Viibryd samples so that she can taper off of it over the next 2 weeks. -     DULoxetine (CYMBALTA) 30 MG capsule; Take 1 capsule (30 mg total) by mouth daily. -     Vilazodone HCl (VIIBRYD STARTER  PACK) 10 & 20 MG KIT; Take 1 tablet by mouth daily. -     ARIPiprazole (ABILIFY) 2 MG tablet; Take 1 tablet (2 mg total) by mouth daily.  Other orders -     Flu Vaccine QUAD 6+ mos PF IM (Fluarix Quad PF) -     Pneumococcal conjugate vaccine 13-valent   I have discontinued Anna Lyons's hydrochlorothiazide, ibuprofen, and Viibryd. I am also having her start on Shingrix, DULoxetine, Viibryd Starter Pack, and ARIPiprazole. Additionally, I am having her maintain her albuterol, multivitamin with minerals, aspirin EC, pramipexole, atorvastatin, Magnesium, ferrous sulfate, temazepam, gabapentin, cholecalciferol, vitamin B-12, baclofen, and levETIRAcetam.  Meds ordered this encounter  Medications  . Zoster Vaccine Adjuvanted [facility]) injection    Sig: Inject 0.5 mLs into the muscle once for 1 dose.    Dispense:  0.5 mL  Refill:  1  . DULoxetine (CYMBALTA) 30 MG capsule    Sig: Take 1 capsule (30 mg total) by mouth daily.    Dispense:  30 capsule    Refill:  0  . Vilazodone HCl (VIIBRYD STARTER PACK) 10 & 20 MG KIT    Sig: Take 1 tablet by mouth daily.    Dispense:  1 kit    Refill:  0  . ARIPiprazole (ABILIFY) 2 MG tablet    Sig: Take 1 tablet (2 mg total) by mouth daily.    Dispense:  30 tablet    Refill:  0   In addition to time spent on CPE, I spent 50 minutes in preparing to see the patient by review of recent labs, imaging and procedures, obtaining and reviewing separately obtained history, communicating with the patient and family or caregiver, ordering medications, tests or procedures, and documenting clinical information in the EHR including the differential Dx, treatment, and any further evaluation and other management of 1. Essential hypertension, benign 2. Tobacco abuse 3. Dyslipidemia, goal LDL below 70 4. B12 deficiency 5. Severe episode of recurrent major depressive disorder, with psychotic features ([place])    Follow-up: Return in about 4 weeks (around  11/08/2019).  Scarlette Calico, MD

## 2019-10-12 LAB — URINALYSIS, ROUTINE W REFLEX MICROSCOPIC
Bilirubin Urine: NEGATIVE
Glucose, UA: NEGATIVE
Hgb urine dipstick: NEGATIVE
Ketones, ur: NEGATIVE
Leukocytes,Ua: NEGATIVE
Nitrite: NEGATIVE
Protein, ur: NEGATIVE
Specific Gravity, Urine: 1.016 (ref 1.001–1.03)
pH: 5.5 (ref 5.0–8.0)

## 2019-10-12 LAB — LIPID PANEL
Cholesterol: 118 mg/dL (ref <200)
HDL: 59 mg/dL (ref >=50)
LDL Cholesterol (Calc): 45 mg/dL (calc)
Non-HDL Cholesterol (Calc): 59 mg/dL (calc) (ref <130)
Total CHOL/HDL Ratio: 2 (calc) (ref <5.0)
Triglycerides: 61 mg/dL (ref <150)

## 2019-10-12 LAB — CBC WITH DIFFERENTIAL/PLATELET
Absolute Monocytes: 474 cells/uL (ref 200–950)
Basophils Absolute: 41 cells/uL (ref 0–200)
Basophils Relative: 0.9 %
Eosinophils Absolute: 51 cells/uL (ref 15–500)
Eosinophils Relative: 1.1 %
HCT: 38.2 % (ref 35.0–45.0)
Hemoglobin: 12.8 g/dL (ref 11.7–15.5)
Lymphs Abs: 1569 cells/uL (ref 850–3900)
MCH: 32.2 pg (ref 27.0–33.0)
MCHC: 33.5 g/dL (ref 32.0–36.0)
MCV: 96.2 fL (ref 80.0–100.0)
MPV: 9.4 fL (ref 7.5–12.5)
Monocytes Relative: 10.3 %
Neutro Abs: 2466 cells/uL (ref 1500–7800)
Neutrophils Relative %: 53.6 %
Platelets: 189 10*3/uL (ref 140–400)
RBC: 3.97 10*6/uL (ref 3.80–5.10)
RDW: 12.2 % (ref 11.0–15.0)
Total Lymphocyte: 34.1 %
WBC: 4.6 10*3/uL (ref 3.8–10.8)

## 2019-10-12 LAB — BASIC METABOLIC PANEL WITH GFR
BUN: 17 mg/dL (ref 7–25)
CO2: 26 mmol/L (ref 20–32)
Calcium: 9.6 mg/dL (ref 8.6–10.4)
Chloride: 101 mmol/L (ref 98–110)
Creat: 0.74 mg/dL (ref 0.50–0.99)
GFR, Est African American: 99 mL/min/{1.73_m2} (ref >=60)
GFR, Est Non African American: 85 mL/min/{1.73_m2} (ref >=60)
Glucose, Bld: 91 mg/dL (ref 65–99)
Potassium: 3.8 mmol/L (ref 3.5–5.3)
Sodium: 138 mmol/L (ref 135–146)

## 2019-10-12 LAB — FOLATE: Folate: >24 ng/mL

## 2019-10-12 LAB — HEPATIC FUNCTION PANEL
AG Ratio: 1.7 (calc) (ref 1.0–2.5)
ALT: 17 U/L (ref 6–29)
AST: 24 U/L (ref 10–35)
Albumin: 4.2 g/dL (ref 3.6–5.1)
Alkaline phosphatase (APISO): 68 U/L (ref 37–153)
Bilirubin, Direct: 0.1 mg/dL (ref 0.0–0.2)
Globulin: 2.5 g/dL (calc) (ref 1.9–3.7)
Indirect Bilirubin: 0.4 mg/dL (calc) (ref 0.2–1.2)
Total Bilirubin: 0.5 mg/dL (ref 0.2–1.2)
Total Protein: 6.7 g/dL (ref 6.1–8.1)

## 2019-10-12 LAB — HEPATITIS C ANTIBODY
Hepatitis C Ab: NONREACTIVE
SIGNAL TO CUT-OFF: 0.02 (ref <1.00)

## 2019-10-12 LAB — VITAMIN B12: Vitamin B-12: 686 pg/mL (ref 200–1100)

## 2019-10-12 LAB — TSH: TSH: 1.96 mIU/L (ref 0.40–4.50)

## 2019-10-15 DIAGNOSIS — M9902 Segmental and somatic dysfunction of thoracic region: Secondary | ICD-10-CM | POA: Diagnosis not present

## 2019-10-15 DIAGNOSIS — M5136 Other intervertebral disc degeneration, lumbar region: Secondary | ICD-10-CM | POA: Diagnosis not present

## 2019-10-15 DIAGNOSIS — M5032 Other cervical disc degeneration, mid-cervical region, unspecified level: Secondary | ICD-10-CM | POA: Diagnosis not present

## 2019-10-15 DIAGNOSIS — M9901 Segmental and somatic dysfunction of cervical region: Secondary | ICD-10-CM | POA: Diagnosis not present

## 2019-10-15 DIAGNOSIS — M9903 Segmental and somatic dysfunction of lumbar region: Secondary | ICD-10-CM | POA: Diagnosis not present

## 2019-10-15 DIAGNOSIS — M5134 Other intervertebral disc degeneration, thoracic region: Secondary | ICD-10-CM | POA: Diagnosis not present

## 2019-10-18 ENCOUNTER — Ambulatory Visit (INDEPENDENT_AMBULATORY_CARE_PROVIDER_SITE_OTHER): Payer: Medicare HMO | Admitting: Psychology

## 2019-10-18 DIAGNOSIS — F3289 Other specified depressive episodes: Secondary | ICD-10-CM | POA: Diagnosis not present

## 2019-10-22 ENCOUNTER — Other Ambulatory Visit: Payer: Self-pay | Admitting: *Deleted

## 2019-10-22 DIAGNOSIS — F17210 Nicotine dependence, cigarettes, uncomplicated: Secondary | ICD-10-CM

## 2019-10-22 DIAGNOSIS — Z87891 Personal history of nicotine dependence: Secondary | ICD-10-CM

## 2019-10-25 ENCOUNTER — Ambulatory Visit (INDEPENDENT_AMBULATORY_CARE_PROVIDER_SITE_OTHER): Payer: Medicare HMO | Admitting: Psychology

## 2019-10-25 ENCOUNTER — Ambulatory Visit: Payer: Medicare HMO | Admitting: Rheumatology

## 2019-10-25 DIAGNOSIS — F3289 Other specified depressive episodes: Secondary | ICD-10-CM | POA: Diagnosis not present

## 2019-10-29 DIAGNOSIS — M9902 Segmental and somatic dysfunction of thoracic region: Secondary | ICD-10-CM | POA: Diagnosis not present

## 2019-10-29 DIAGNOSIS — M9901 Segmental and somatic dysfunction of cervical region: Secondary | ICD-10-CM | POA: Diagnosis not present

## 2019-10-29 DIAGNOSIS — M5136 Other intervertebral disc degeneration, lumbar region: Secondary | ICD-10-CM | POA: Diagnosis not present

## 2019-10-29 DIAGNOSIS — M5032 Other cervical disc degeneration, mid-cervical region, unspecified level: Secondary | ICD-10-CM | POA: Diagnosis not present

## 2019-10-29 DIAGNOSIS — M9903 Segmental and somatic dysfunction of lumbar region: Secondary | ICD-10-CM | POA: Diagnosis not present

## 2019-10-29 DIAGNOSIS — M5134 Other intervertebral disc degeneration, thoracic region: Secondary | ICD-10-CM | POA: Diagnosis not present

## 2019-11-01 ENCOUNTER — Ambulatory Visit (INDEPENDENT_AMBULATORY_CARE_PROVIDER_SITE_OTHER): Payer: Medicare HMO | Admitting: Psychology

## 2019-11-01 DIAGNOSIS — F3289 Other specified depressive episodes: Secondary | ICD-10-CM

## 2019-11-08 ENCOUNTER — Other Ambulatory Visit: Payer: Self-pay | Admitting: Internal Medicine

## 2019-11-08 ENCOUNTER — Ambulatory Visit (INDEPENDENT_AMBULATORY_CARE_PROVIDER_SITE_OTHER): Payer: Medicare HMO | Admitting: Psychology

## 2019-11-08 ENCOUNTER — Other Ambulatory Visit: Payer: Self-pay | Admitting: *Deleted

## 2019-11-08 DIAGNOSIS — F3289 Other specified depressive episodes: Secondary | ICD-10-CM | POA: Diagnosis not present

## 2019-11-08 DIAGNOSIS — F333 Major depressive disorder, recurrent, severe with psychotic symptoms: Secondary | ICD-10-CM

## 2019-11-08 DIAGNOSIS — F331 Major depressive disorder, recurrent, moderate: Secondary | ICD-10-CM

## 2019-11-08 MED ORDER — DULOXETINE HCL 60 MG PO CPEP: 60.0000 mg | ORAL_CAPSULE | Freq: Every day | ORAL | 1 refills | Status: DC

## 2019-11-08 MED ORDER — ARIPIPRAZOLE 2 MG PO TABS: 2.0000 mg | ORAL_TABLET | Freq: Every day | ORAL | 1 refills | Status: DC

## 2019-11-08 NOTE — Telephone Encounter (Signed)
Pt saw you on 10/11/19 pls advise if ok to send 90 day to [facility].Marland KitchenJohny Chess

## 2019-11-14 ENCOUNTER — Ambulatory Visit: Payer: Medicare HMO | Admitting: Rheumatology

## 2019-11-15 ENCOUNTER — Telehealth: Payer: Self-pay | Admitting: Internal Medicine

## 2019-11-15 DIAGNOSIS — F331 Major depressive disorder, recurrent, moderate: Secondary | ICD-10-CM

## 2019-11-15 MED ORDER — DULOXETINE HCL 60 MG PO CPEP: 60.0000 mg | ORAL_CAPSULE | Freq: Every day | ORAL | 0 refills | Status: DC

## 2019-11-15 NOTE — Telephone Encounter (Signed)
    Patient requesting short supply of DULoxetine (CYMBALTA) 60 MG capsule be sent to [place], [place] She will be out of medication 10/15. [employer] is sending supply but not due to arrive for several days

## 2019-11-16 ENCOUNTER — Other Ambulatory Visit: Payer: Self-pay | Admitting: Internal Medicine

## 2019-11-16 DIAGNOSIS — F331 Major depressive disorder, recurrent, moderate: Secondary | ICD-10-CM

## 2019-11-16 DIAGNOSIS — F333 Major depressive disorder, recurrent, severe with psychotic symptoms: Secondary | ICD-10-CM

## 2019-11-19 ENCOUNTER — Telehealth: Payer: Medicare HMO

## 2019-11-19 ENCOUNTER — Ambulatory Visit (INDEPENDENT_AMBULATORY_CARE_PROVIDER_SITE_OTHER): Payer: Medicare HMO | Admitting: Psychology

## 2019-11-19 DIAGNOSIS — F3289 Other specified depressive episodes: Secondary | ICD-10-CM

## 2019-11-21 ENCOUNTER — Encounter: Payer: Self-pay | Admitting: Acute Care

## 2019-11-21 ENCOUNTER — Ambulatory Visit (INDEPENDENT_AMBULATORY_CARE_PROVIDER_SITE_OTHER): Payer: Medicare HMO | Admitting: Acute Care

## 2019-11-21 ENCOUNTER — Ambulatory Visit
Admission: RE | Admit: 2019-11-21 | Discharge: 2019-11-21 | Disposition: A | Discharge status: Home / Self Care | Payer: Medicare HMO | Source: Ambulatory Visit | Attending: Acute Care | Admitting: Acute Care

## 2019-11-21 ENCOUNTER — Other Ambulatory Visit: Payer: Self-pay

## 2019-11-21 VITALS — BP 120/72 | HR 65 | Temp 97.3°F | Ht 66.0 in | Wt 126.9 lb

## 2019-11-21 DIAGNOSIS — Z87891 Personal history of nicotine dependence: Secondary | ICD-10-CM

## 2019-11-21 DIAGNOSIS — I700 Atherosclerosis of aorta: Secondary | ICD-10-CM | POA: Diagnosis not present

## 2019-11-21 DIAGNOSIS — F17210 Nicotine dependence, cigarettes, uncomplicated: Secondary | ICD-10-CM

## 2019-11-21 DIAGNOSIS — J439 Emphysema, unspecified: Secondary | ICD-10-CM | POA: Diagnosis not present

## 2019-11-21 DIAGNOSIS — I2510 Atherosclerotic heart disease of native coronary artery without angina pectoris: Secondary | ICD-10-CM | POA: Diagnosis not present

## 2019-11-21 DIAGNOSIS — Z122 Encounter for screening for malignant neoplasm of respiratory organs: Secondary | ICD-10-CM

## 2019-11-21 NOTE — Patient Instructions (Signed)
Thank you for participating in the [facility] Screening Program. It was our pleasure to meet you today. We will call you with the results of your scan within the next few days. Your scan will be assigned a Lung RADS category score by the physicians reading the scans.  This Lung RADS score determines follow up scanning.  See below for description of categories, and follow up screening recommendations. We will be in touch to schedule your follow up screening annually or based on recommendations of our providers. We will fax a copy of your scan results to your Primary Care Physician, or the physician who referred you to the program, to ensure they have the results. Please call the office if you have any questions or concerns regarding your scanning experience or results.  Our office number is [phone]. Please speak with Denise Phelps, RN. She is our Lung Cancer Screening RN. If she is unavailable when you call, please have the office staff send her a message. She will return your call at her earliest convenience. Remember, if your scan is normal, we will scan you annually as long as you continue to meet the criteria for the program. (Age 55-77, Current smoker or smoker who has quit within the last 15 years). If you are a smoker, remember, quitting is the single most powerful action that you can take to decrease your risk of lung cancer and other pulmonary, breathing related problems. We know quitting is hard, and we are here to help.  Please let us know if there is anything we can do to help you meet your goal of quitting. If you are a former smoker, congratulations. We are proud of you! Remain smoke free! Remember you can refer friends or family members through the number above.  We will screen them to make sure they meet criteria for the program. Thank you for helping us take better care of you by participating in Lung Screening.  Lung RADS Categories:  Lung RADS 1: no nodules  or definitely non-concerning nodules.  Recommendation is for a repeat annual scan in 12 months.  Lung RADS 2:  nodules that are non-concerning in appearance and behavior with a very low likelihood of becoming an active cancer. Recommendation is for a repeat annual scan in 12 months.  Lung RADS 3: nodules that are probably non-concerning , includes nodules with a low likelihood of becoming an active cancer.  Recommendation is for a 6-month repeat screening scan. Often noted after an upper respiratory illness. We will be in touch to make sure you have no questions, and to schedule your 6-month scan.  Lung RADS 4 A: nodules with concerning findings, recommendation is most often for a follow up scan in 3 months or additional testing based on our provider's assessment of the scan. We will be in touch to make sure you have no questions and to schedule the recommended 3 month follow up scan.  Lung RADS 4 B:  indicates findings that are concerning. We will be in touch with you to schedule additional diagnostic testing based on our provider's  assessment of the scan.   

## 2019-11-21 NOTE — Progress Notes (Signed)
Shared Decision Making Visit Lung Cancer Screening Program [phone])   Eligibility:  Age 64 y.o.  Pack Years Smoking History Calculation 33 pack year  (# packs/per year x # years smoked)  Recent History of coughing up blood  no  Unexplained weight loss? no ( >Than 15 pounds within the last 6 months )  Prior History Lung / other cancer no (Diagnosis within the last 5 years already requiring surveillance chest CT Scans).  Smoking Status Current Smoker  Former Smokers: Years since quit: NA  Quit Date: NA  Visit Components:  Discussion included one or more decision making aids. yes  Discussion included risk/benefits of screening. yes  Discussion included potential follow up diagnostic testing for abnormal scans. yes  Discussion included meaning and risk of over diagnosis. yes  Discussion included meaning and risk of False Positives. yes  Discussion included meaning of total radiation exposure. yes  Counseling Included:  Importance of adherence to annual lung cancer LDCT screening. yes  Impact of comorbidities on ability to participate in the program. yes  Ability and willingness to under diagnostic treatment. yes  Smoking Cessation Counseling:  Current Smokers:   Discussed importance of smoking cessation. yes  Information about tobacco cessation classes and interventions provided to patient. yes  Patient provided with "ticket" for LDCT Scan. yes  Symptomatic Patient. no  Counseling   Diagnosis Code: Tobacco Use Z72.0  Asymptomatic Patient yes  Counseling (Intermediate counseling: > three minutes counseling) G2694  Former Smokers:   Discussed the importance of maintaining cigarette abstinence. yes  Diagnosis Code: Personal History of Nicotine Dependence. W54.627  Information about tobacco cessation classes and interventions provided to patient. Yes  Patient provided with "ticket" for LDCT Scan. yes  Written Order for Lung Cancer Screening with LDCT  placed in Epic. Yes (CT Chest Lung Cancer Screening Low Dose W/O CM) [id number] Z12.2-Screening of respiratory organs Z87.891-Personal history of nicotine dependence  BP 120/72 (BP Location: Left Arm, Cuff Size: Normal)   Pulse 65   Temp (!) 97.3 F (36.3 C) (Oral)   Ht 5\' 6"  (1.676 m)   Wt 126 lb 13.9 oz (57.5 kg)   SpO2 99%   BMI 20.48 kg/m    I have spent 25 minutes of face to face time with Ms. Mccutcheon discussing the risks and benefits of lung cancer screening. We viewed a power point together that explained in detail the above noted topics. We paused at intervals to allow for questions to be asked and answered to ensure understanding.We discussed that the single most powerful action that she can take to decrease her risk of developing lung cancer is to quit smoking. We discussed whether or not she is ready to commit to setting a quit date. We discussed options for tools to aid in quitting smoking including nicotine replacement therapy, non-nicotine medications, support groups, Quit Smart classes, and behavior modification. We discussed that often times setting smaller, more achievable goals, such as eliminating 1 cigarette a day for a week and then 2 cigarettes a day for a week can be helpful in slowly decreasing the number of cigarettes smoked. This allows for a sense of accomplishment as well as providing a clinical benefit. I gave her the " Be Stronger Than Your Excuses" card with contact information for community resources, classes, free nicotine replacement therapy, and access to mobile apps, text messaging, and on-line smoking cessation help. I have also given her my card and contact information in the event she needs to contact me. We discussed  the time and location of the scan, and that either Doroteo Glassman RN or I will call with the results within 24-48 hours of receiving them. I have offered her  a copy of the power point we viewed  as a resource in the event they need reinforcement of the  concepts we discussed today in the office. The patient verbalized understanding of all of  the above and had no further questions upon leaving the office. They have my contact information in the event they have any further questions.  I spent 3 minutes counseling on smoking cessation and the health risks of continued tobacco abuse.  I explained to the patient that there has been a high incidence of coronary artery disease noted on these exams. I explained that this is a non-gated exam therefore degree or severity cannot be determined. This patient is  on statin therapy. I have asked the patient to follow-up with their PCP regarding any incidental finding of coronary artery disease and management with diet or medication as their PCP  feels is clinically indicated. The patient verbalized understanding of the above and had no further questions upon completion of the visit.      Magdalen Spatz, NP 11/21/2019 12:00 PM

## 2019-11-23 ENCOUNTER — Other Ambulatory Visit: Payer: Self-pay

## 2019-11-23 ENCOUNTER — Ambulatory Visit: Payer: Medicare HMO | Admitting: Pharmacist

## 2019-11-23 DIAGNOSIS — F331 Major depressive disorder, recurrent, moderate: Secondary | ICD-10-CM

## 2019-11-23 DIAGNOSIS — I10 Essential (primary) hypertension: Secondary | ICD-10-CM

## 2019-11-23 DIAGNOSIS — E785 Hyperlipidemia, unspecified: Secondary | ICD-10-CM

## 2019-11-23 NOTE — Chronic Care Management (AMB) (Signed)
Chronic Care Management Pharmacy  Name: Anna Lyons  MRN: [id number] DOB: 1955-05-21   Chief Complaint/ HPI  Anna Lyons,  64 y.o. , female presents for their Follow-Up CCM visit with the clinical pharmacist via telephone due to COVID-19 Pandemic.  PCP : Anna Koch, MD Patient Care Team: Anna Koch, MD as PCP - General (Internal Medicine) Anna Pate Luanna Salk, MD as Consulting Physician (Physical Medicine and Rehabilitation) Anna Lyons, [facility] as Pharmacist (Pharmacist)  Their chronic conditions include: Hypertension, Coronary Artery Disease, Depression, Anxiety, Tobacco use and Seizure disorder, Hx TIA, RLS  Pt lives alone, works as a Public affairs consultant. Her husband passed 7 years ago, after which she got a chihuaha to keep her company. Her best friend passed a year ago, and she has had trouble spending time with new friends. She spends most of her time alone in her house in bed, she has very little appetite and reports depression is severe right now.  Office Visits: 10/11/19 Anna Lyons OV: CPX. SBP 118, advised to stop HCTZ. Taper off Viibyrd and start duloxetine + Abilify  05/24/19 Anna Lyons OV: rx ibuprofen for claudication until neuro can evaluate. C/o cold hands, previously evaluated by rheum and felt smoking was the issue, not rheum disease  05/16/19 Anna Lyons OV: paresthesia of both feet - MRI to evaluate. Stopped baclofen due to dizziness.  02/28/19 Anna Lyons OV: tinnitus - referred to audiology, ordered ABI and MRI for claudication and dizziness in s/o hx stroke, respectively.  Consult Visit: 11/21/19 Anna Lyons (pulmonary): lung cancer screening.  06/14/19 Anna Lyons (neurosurgery): via claims  04/26/19 Anna Lyons (neurology): RLE paresthesias - rec'd gabapentin 300 mg HS, discontinue topiramate d/t no clear benefit and cognitive side effects.   Regular chiropractor visits.  Allergies  Allergen Reactions  . Codeine Itching  .  Oxycodone-Acetaminophen Hives   Medications: Outpatient Encounter Medications as of 11/23/2019  Medication Sig  . albuterol (PROVENTIL HFA;VENTOLIN HFA) 108 (90 Base) MCG/ACT inhaler Inhale 2 puffs into the lungs every 6 (six) hours as needed for wheezing or shortness of breath.  . ARIPiprazole (ABILIFY) 2 MG tablet Take 1 tablet by mouth once daily  . aspirin EC 81 MG tablet Take 81 mg by mouth daily.  [name] atorvastatin (LIPITOR) 80 MG tablet Take 1 tablet (80 mg total) by mouth at bedtime.  . baclofen (LIORESAL) 20 MG tablet TAKE 1 TABLET THREE TIMES DAILY  . cholecalciferol (VITAMIN D3) 25 MCG (1000 UNIT) tablet Take 1,000 Units by mouth daily.  . DULoxetine (CYMBALTA) 60 MG capsule Take 1 capsule (60 mg total) by mouth daily.  . ferrous sulfate 324 MG TBEC Take 324 mg by mouth.  . gabapentin (NEURONTIN) 300 MG capsule Take 1 capsule (300 mg total) by mouth at bedtime.  . levETIRAcetam (KEPPRA) 500 MG tablet TAKE 1 TABLET (500 MG TOTAL) BY MOUTH 2 (TWO) TIMES DAILY.  . Magnesium 250 MG TABS Take by mouth.  . Multiple Vitamin (MULTIVITAMIN WITH MINERALS) TABS tablet Take 1 tablet by mouth daily.  . pramipexole (MIRAPEX) 0.5 MG tablet Take 1 tablet (0.5 mg total) by mouth at bedtime as needed.  . temazepam (RESTORIL) 15 MG capsule TAKE 1-2 AT BEDTIME AS NEEDED FOR SLEEP  . vitamin B-12 (CYANOCOBALAMIN) 1000 MCG tablet Take 1,000 mcg by mouth daily.  . [DISCONTINUED] ARIPiprazole (ABILIFY) 2 MG tablet Take 1 tablet (2 mg total) by mouth daily.   No facility-administered encounter medications on file as of 11/23/2019.  Wt Readings from Last 3 Encounters:  11/21/19 126 lb 13.9 oz (57.5 kg)  10/11/19 129 lb (58.5 kg)  05/24/19 136 lb (61.7 kg)    Current Diagnosis/Assessment:    Goals Addressed            This Visit's Progress   . Pharmacy Care Plan       CARE PLAN ENTRY (see longitudinal plan of care for additional care plan information)  Current Barriers:  . Chronic Disease  Management support, education, and care coordination needs related to Hypertension, Hyperlipidemia, and Depression   Hypertension BP Readings from Last 3 Encounters:  05/24/19 (!) 148/66  05/16/19 124/72  04/26/19 117/73 .  Pharmacist Clinical Goal(s): o Over the next 90 days, patient will work with PharmD and providers to maintain BP goal <130/80 . Current regimen:  o No medication . Interventions: o Discussed BP goals and benefits of diet/exercise for prevention of heart attack / stroke . Patient self care activities - Over the next 90 days, patient will: o Check BP as needed, document, and provide at future appointments o Ensure daily salt intake < 2300 mg/day  Hyperlipidemia / CAD / TIA Lab Results  Component Value Date/Time   LDLCALC 39 10/12/2018 03:44 AM .  Pharmacist Clinical Goal(s): o Over the next 90 days, patient will work with PharmD and providers to maintain LDL goal < 70 . Current regimen:  o Atorvastatin 80 mg at bedtime o Aspirin 81 mg daily . Interventions: o Discussed cholesterol goals and benefits of medication for prevention of heart attack / stroke . Patient self care activities - Over the next 90 days, patient will: o Continue medication as prescribed  Depression . Pharmacist Clinical Goal(s) o Over the next 90 days, patient will work with PharmD and providers to optimize therapy . Current regimen:  o Duloxetine 60 mg daily o Aripiprazole 2 mg daily o Temazepam 15 mg at bedtime . Interventions: o Discussed benefits of medications . Patient self care activities - Over the next 90 days, patient will: o Continue medication as directed o Contact provider if depression or anxiety worsens  Medication management . Pharmacist Clinical Goal(s): o Over the next 90 days, patient will work with PharmD and providers to maintain optimal medication adherence . Current pharmacy: [facility] order . Interventions o Comprehensive medication review  performed. o Continue current medication management strategy . Patient self care activities - Over the next 90 days, patient will: o Focus on medication adherence by fill date o Take medications as prescribed o Report any questions or concerns to PharmD and/or provider(s)  Please see past updates related to this goal by clicking on the "Past Updates" button in the selected goal        Hypertension   BP goal is:  <130/80  Office blood pressures are  BP Readings from Last 3 Encounters:  11/21/19 120/72  10/11/19 118/72  05/24/19 (!) 148/66   Kidney Function Lab Results  Component Value Date/Time   CREATININE 0.74 10/11/2019 01:53 PM   CREATININE 0.70 02/28/2019 10:15 AM   CREATININE 0.94 11/30/2018 05:38 PM   GFR 83.96 02/28/2019 10:15 AM   GFRNONAA 85 10/11/2019 01:53 PM   GFRAA 99 10/11/2019 01:53 PM   K 3.8 10/11/2019 01:53 PM   K 3.6 02/28/2019 10:15 AM   Patient checks BP at home infrequently Patient home BP readings are ranging: n/a  Patient has failed these meds in the past: triamterene-HCTZ, Patient is currently controlled on the following medications:  .  No medications  We discussed BP goals; HCTZ was recently discontinue due to overcontrolled BP. Pt does not endorse issues with BP at home.  Plan  Continue current medications and control with diet and exercise     Hyperlipidemia / CAD   NSTEMI 2014, TIA 2015 LDL goal < 70  Lipid Panel     Component Value Date/Time   CHOL 118 10/11/2019 1353   TRIG 61 10/11/2019 1353   HDL 59 10/11/2019 1353   LDLCALC 45 10/11/2019 1353    Hepatic Function Latest Ref Rng & Units 10/11/2019 02/28/2019 11/30/2018  Total Protein 6.1 - 8.1 g/dL 6.7 7.1 6.6  Albumin 3.5 - 5.2 g/dL - 4.1 3.7  AST 10 - 35 U/L '24 25 24  ' ALT 6 - 29 U/L '17 20 20  ' Alk Phosphatase 39 - 117 U/L - 76 67  Total Bilirubin 0.2 - 1.2 mg/dL 0.5 0.6 0.8  Bilirubin, Direct 0.0 - 0.2 mg/dL 0.1 - -     The ASCVD Risk score ([place]., et al.,  2013) failed to calculate for the following reasons:   The patient has a prior MI or stroke diagnosis   Patient has failed these meds in past: n/a Patient is currently controlled on the following medications:  . Atorvastatin 80 mg HS . Aspirin 81 mg daily  We discussed:  Cholesterol goals; benefits of statin for ASCVD risk reduction  Plan  Continue current medications   Depression / PTSD   Depression screen [facility] 2/9 01/12/2019 07/21/2018 12/02/2016  Decreased Interest '2 1 3  ' Down, Depressed, Hopeless 1 0 3  PHQ - 2 Score '3 1 6  ' Altered sleeping '3 2 3  ' Tired, decreased energy '3 3 3  ' Change in appetite '3 3 3  ' Feeling bad or failure about yourself  '1 1 3  ' Trouble concentrating 2 0 3  Moving slowly or fidgety/restless 3 0 2  Suicidal thoughts 1 0 1  PHQ-9 Score '19 10 24  ' Difficult doing work/chores - - -  Some recent data might be hidden   Patient has failed these meds in past: bupropion, duloxetine, escitalopram, fluoxetine, mirtazapine, sertraline, trazodone, B[place]omra Patient is currently uncontrolled on the following medications:  . Duloxetine 60 mg daily . Aripiprazole 2 mg daily . Temazepam 15 mg HS  We discussed: Pt reports improvement in mood since starting new medication; she denies issues  Plan  Continue current medications   Medication Management   Pt uses [facility] mail order pharmacy for all medications Uses pill box? No - prefers bottles Pt endorses 100% compliance  We discussed: All medications are free through [facility]. Pt is satisfied with pharmacy services.  Plan  Continue current medication management strategy   Follow up: 3 month phone visit  Charlene Brooke, PharmD, [facility] Clinical Pharmacist [place] [facility] at [facility] [phone]

## 2019-11-23 NOTE — Patient Instructions (Signed)
Visit Information  Phone number for Pharmacist: [phone]  Goals Addressed            This [place] (see longitudinal plan of care for additional care plan information)  Current Barriers:   Chronic Disease Management support, education, and care coordination needs related to Hypertension, Hyperlipidemia, and Depression   Hypertension BP Readings from Last 3 Encounters:  05/24/19 (!) 148/66  05/16/19 124/72  04/26/19 117/73   Pharmacist Clinical Goal(s): o Over the next 90 days, patient will work with PharmD and providers to maintain BP goal <130/80  Current regimen:  o No medication  Interventions: o Discussed BP goals and benefits of diet/exercise for prevention of heart attack / stroke  Patient self care activities - Over the next 90 days, patient will: o Check BP as needed, document, and provide at future appointments o Ensure daily salt intake < 2300 mg/day  Hyperlipidemia / CAD / TIA Lab Results  Component Value Date/Time   LDLCALC 39 10/12/2018 03:44 AM   Pharmacist Clinical Goal(s): o Over the next 90 days, patient will work with PharmD and providers to maintain LDL goal < 70  Current regimen:  o Atorvastatin 80 mg at bedtime o Aspirin 81 mg daily  Interventions: o Discussed cholesterol goals and benefits of medication for prevention of heart attack / stroke  Patient self care activities - Over the next 90 days, patient will: o Continue medication as prescribed  Depression  Pharmacist Clinical Goal(s) o Over the next 90 days, patient will work with PharmD and providers to optimize therapy  Current regimen:  o Duloxetine 60 mg daily o Aripiprazole 2 mg daily o Temazepam 15 mg at bedtime  Interventions: o Discussed benefits of medications  Patient self care activities - Over the next 90 days, patient will: o Continue medication as directed o Contact provider if depression or anxiety  worsens  Medication management  Pharmacist Clinical Goal(s): o Over the next 90 days, patient will work with PharmD and providers to maintain optimal medication adherence  Current pharmacy: [facility] order  Interventions o Comprehensive medication review performed. o Continue current medication management strategy  Patient self care activities - Over the next 90 days, patient will: o Focus on medication adherence by fill date o Take medications as prescribed o Report any questions or concerns to PharmD and/or provider(s)  Please see past updates related to this goal by clicking on the "Past Updates" button in the selected goal       Patient verbalizes understanding of instructions provided today.  Telephone follow up appointment with pharmacy team member scheduled for: 3 months  Charlene Brooke, PharmD, [facility] Clinical Pharmacist [place] [facility] at [facility] [phone]

## 2019-11-26 ENCOUNTER — Other Ambulatory Visit: Payer: Self-pay | Admitting: Internal Medicine

## 2019-11-26 MED ORDER — TEMAZEPAM 15 MG PO CAPS: ORAL_CAPSULE | ORAL | 0 refills | Status: DC

## 2019-11-26 NOTE — Telephone Encounter (Signed)
Patient requesting short supply to local pharmacy while waiting for supply from [facility]  1.Medication Requested:temazepam (RESTORIL) 15 MG capsule  2. Pharmacy (Name, [place], [facility]): [place], [place] Phone:  [phone]  Fax:  [phone]    [place], [place]      3. On Med List: yes-   4. Last Visit with PCP: 11/16/19 jones  5. Next visit date with PCP:   Agent: Please be advised that RX refills may take up to 3 business days. We ask that you follow-up with your pharmacy.

## 2019-11-26 NOTE — Telephone Encounter (Signed)
Ok to let pt know  temezepam done 1 mo to [facility] on [address] due to Dr C out of office

## 2019-11-29 NOTE — Progress Notes (Signed)
Please call patient and let them  know their  low dose Ct was read as a Lung RADS 2: nodules that are benign in appearance and behavior with a very low likelihood of becoming a clinically active cancer due to size or lack of growth. Recommendation per radiology is for a repeat LDCT in 12 months. .Please let them  know we will order and schedule their  annual screening scan for 11/2020. Please let them  know there was notation of CAD on their  scan.  Please remind the patient  that this is a non-gated exam therefore degree or severity of disease  cannot be determined. Please have them  follow up with their PCP regarding potential risk factor modification, dietary therapy or pharmacologic therapy if clinically indicated. Pt.  is  currently on statin therapy. Please place order for annual  screening scan for  11/2020 and fax results to PCP. Thanks so much. 

## 2019-11-30 ENCOUNTER — Other Ambulatory Visit: Payer: Self-pay | Admitting: *Deleted

## 2019-11-30 DIAGNOSIS — Z87891 Personal history of nicotine dependence: Secondary | ICD-10-CM

## 2019-11-30 DIAGNOSIS — F17210 Nicotine dependence, cigarettes, uncomplicated: Secondary | ICD-10-CM

## 2019-12-03 ENCOUNTER — Ambulatory Visit: Payer: Medicare HMO | Admitting: Psychology

## 2019-12-17 ENCOUNTER — Ambulatory Visit (INDEPENDENT_AMBULATORY_CARE_PROVIDER_SITE_OTHER): Payer: Medicare HMO | Admitting: Psychology

## 2019-12-17 DIAGNOSIS — F3289 Other specified depressive episodes: Secondary | ICD-10-CM | POA: Diagnosis not present

## 2019-12-22 ENCOUNTER — Other Ambulatory Visit: Payer: Self-pay | Admitting: Neurology

## 2019-12-22 ENCOUNTER — Other Ambulatory Visit: Payer: Self-pay | Admitting: Internal Medicine

## 2020-01-02 ENCOUNTER — Ambulatory Visit (INDEPENDENT_AMBULATORY_CARE_PROVIDER_SITE_OTHER): Payer: Medicare HMO | Admitting: Psychology

## 2020-01-02 DIAGNOSIS — F3289 Other specified depressive episodes: Secondary | ICD-10-CM

## 2020-01-10 DIAGNOSIS — G9009 Other idiopathic peripheral autonomic neuropathy: Secondary | ICD-10-CM | POA: Diagnosis not present

## 2020-01-10 DIAGNOSIS — R768 Other specified abnormal immunological findings in serum: Secondary | ICD-10-CM | POA: Diagnosis not present

## 2020-01-10 DIAGNOSIS — Z6821 Body mass index (BMI) 21.0-21.9, adult: Secondary | ICD-10-CM | POA: Diagnosis not present

## 2020-01-10 DIAGNOSIS — M25519 Pain in unspecified shoulder: Secondary | ICD-10-CM | POA: Diagnosis not present

## 2020-01-10 DIAGNOSIS — I69398 Other sequelae of cerebral infarction: Secondary | ICD-10-CM | POA: Diagnosis not present

## 2020-01-17 ENCOUNTER — Ambulatory Visit: Payer: Medicare HMO | Admitting: Psychology

## 2020-01-17 DIAGNOSIS — Z779 Other contact with and (suspected) exposures hazardous to health: Secondary | ICD-10-CM | POA: Diagnosis not present

## 2020-01-17 DIAGNOSIS — Z6822 Body mass index (BMI) 22.0-22.9, adult: Secondary | ICD-10-CM | POA: Diagnosis not present

## 2020-01-17 DIAGNOSIS — Z124 Encounter for screening for malignant neoplasm of cervix: Secondary | ICD-10-CM | POA: Diagnosis not present

## 2020-01-21 ENCOUNTER — Telehealth: Payer: Self-pay | Admitting: Internal Medicine

## 2020-01-21 NOTE — Telephone Encounter (Signed)
1.Medication Requested:temazepam (RESTORIL) 15 MG capsule   2. Pharmacy (Name, [place], [place], [place]  3. On Med List: yes   4. Last Visit with PCP: 4.22.21  5. Next visit date with PCP: n/a   Agent: Please be advised that RX refills may take up to 3 business days. We ask that you follow-up with your pharmacy.

## 2020-01-22 MED ORDER — TEMAZEPAM 15 MG PO CAPS: ORAL_CAPSULE | ORAL | 3 refills | Status: DC

## 2020-01-23 ENCOUNTER — Other Ambulatory Visit: Payer: Self-pay | Admitting: Neurology

## 2020-01-23 ENCOUNTER — Other Ambulatory Visit: Payer: Self-pay | Admitting: Internal Medicine

## 2020-01-24 NOTE — Telephone Encounter (Signed)
This has been d/c'd.

## 2020-01-28 ENCOUNTER — Telehealth: Payer: Self-pay | Admitting: Internal Medicine

## 2020-01-28 NOTE — Telephone Encounter (Signed)
Left message for patient to call back and schedule Medicare Annual Wellness Visit (AWV) with Nurse Health Advisor. This can be scheduled IN OFFICE or TELEPHONE VISIT.   This should be a 45 minute visit  Last AWV 09/01/16

## 2020-01-29 DIAGNOSIS — Z6820 Body mass index (BMI) 20.0-20.9, adult: Secondary | ICD-10-CM | POA: Diagnosis not present

## 2020-01-29 DIAGNOSIS — M0579 Rheumatoid arthritis with rheumatoid factor of multiple sites without organ or systems involvement: Secondary | ICD-10-CM | POA: Diagnosis not present

## 2020-01-29 DIAGNOSIS — M25519 Pain in unspecified shoulder: Secondary | ICD-10-CM | POA: Diagnosis not present

## 2020-01-29 DIAGNOSIS — I69398 Other sequelae of cerebral infarction: Secondary | ICD-10-CM | POA: Diagnosis not present

## 2020-01-29 DIAGNOSIS — R768 Other specified abnormal immunological findings in serum: Secondary | ICD-10-CM | POA: Diagnosis not present

## 2020-01-29 DIAGNOSIS — G9009 Other idiopathic peripheral autonomic neuropathy: Secondary | ICD-10-CM | POA: Diagnosis not present

## 2020-02-02 ENCOUNTER — Other Ambulatory Visit: Payer: Self-pay | Admitting: Internal Medicine

## 2020-02-08 ENCOUNTER — Ambulatory Visit (INDEPENDENT_AMBULATORY_CARE_PROVIDER_SITE_OTHER): Payer: Medicare HMO

## 2020-02-08 ENCOUNTER — Other Ambulatory Visit: Payer: Self-pay

## 2020-02-08 VITALS — BP 116/70 | HR 70 | Temp 98.1°F | Ht 66.0 in | Wt 126.0 lb

## 2020-02-08 DIAGNOSIS — Z0000 Encounter for general adult medical examination without abnormal findings: Secondary | ICD-10-CM | POA: Diagnosis not present

## 2020-02-08 NOTE — Progress Notes (Signed)
Subjective:   Anna Lyons is a 65 y.o. female who presents for Medicare Annual (Subsequent) preventive examination.  Review of Systems    No ROS. Medicare Wellness Visit. Additional risk factors are reflected in social history. Cardiac Risk Factors include: advanced age (>40men, >72 women);dyslipidemia;hypertension     Objective:    Today's Vitals   02/08/20 1048 02/08/20 1105  BP: 116/70   Pulse: 70   Temp: 98.1 F (36.7 C)   SpO2: 96%   Weight: 126 lb (57.2 kg)   Height: 5\' 6"  (1.676 m)   PainSc:  7    Body mass index is 20.34 kg/m.  Advanced Directives 02/08/2020 11/30/2018 10/12/2018 10/11/2018 07/20/2018 02/14/2018 11/02/2016  Does Patient Have a Medical Advance Directive? No No Yes No No No No  Type of Advance Directive - - Healthcare Power of Attorney - - - -  Does patient want to make changes to medical advance directive? - - No - Patient declined - - - -  Copy of Healthcare Power of Attorney in Chart? - - No - copy requested - - - -  Would patient like information on creating a medical advance directive? No - Patient declined No - Patient declined No - Patient declined No - Patient declined No - Patient declined No - Patient declined No - Patient declined  Pre-existing out of facility DNR order (yellow form or pink MOST form) - - - - - - -    Current Medications (verified) Outpatient Encounter Medications as of 02/08/2020  Medication Sig  . albuterol (PROVENTIL HFA;VENTOLIN HFA) 108 (90 Base) MCG/ACT inhaler Inhale 2 puffs into the lungs every 6 (six) hours as needed for wheezing or shortness of breath.  . ARIPiprazole (ABILIFY) 2 MG tablet Take 1 tablet by mouth once daily  . aspirin EC 81 MG tablet Take 81 mg by mouth daily.  [name] atorvastatin (LIPITOR) 80 MG tablet Take 1 tablet (80 mg total) by mouth at bedtime.  . baclofen (LIORESAL) 20 MG tablet TAKE 1 TABLET THREE TIMES DAILY (Patient not taking: Reported on 02/08/2020)  . cholecalciferol (VITAMIN D3) 25 MCG (1000  UNIT) tablet Take 1,000 Units by mouth daily.  . DULoxetine (CYMBALTA) 60 MG capsule Take 1 capsule (60 mg total) by mouth daily.  . ferrous sulfate 324 MG TBEC Take 324 mg by mouth.  . gabapentin (NEURONTIN) 300 MG capsule Take 1 capsule (300 mg total) by mouth at bedtime.  . levETIRAcetam (KEPPRA) 500 MG tablet TAKE 1 TABLET TWICE DAILY  . Magnesium 250 MG TABS Take by mouth.  . Multiple Vitamin (MULTIVITAMIN WITH MINERALS) TABS tablet Take 1 tablet by mouth daily.  . pramipexole (MIRAPEX) 0.5 MG tablet TAKE 1 TABLET (0.5 MG TOTAL) BY MOUTH AT BEDTIME AS NEEDED.  [name] temazepam (RESTORIL) 15 MG capsule TAKE 1-2 AT BEDTIME AS NEEDED FOR SLEEP  . vitamin B-12 (CYANOCOBALAMIN) 1000 MCG tablet Take 1,000 mcg by mouth daily.   No facility-administered encounter medications on file as of 02/08/2020.    Allergies (verified) Codeine and Oxycodone-acetaminophen   History: Past Medical History:  Diagnosis Date  . Angina   . Breast cyst   . Cancer (HCC)   . Depression   . Hypertension   . RLS (restless legs syndrome) 11/16/2011  . Seizures (HCC) 2016   last seizure="last year, 2016" per pt.  . Stroke (HCC) 04/20/2011   a. 04/20/11 Left subcortical infarct treated w/ TPA;  b. 04/2011 Echo: EF 55-60%;  c. 04/2011 Normal Carotid  u/s  d. Residual "walk w/limp on right; unable to grasp w/right hand"  . TIA (transient ischemic attack) 08/2010  . Tobacco abuse    a. quit @ time of CVA 04/2011.   Past Surgical History:  Procedure Laterality Date  . BREAST RECONSTRUCTION     bilaterally  . CARPAL TUNNEL RELEASE Left 1990's  . CARPAL TUNNEL RELEASE Left 12/08/2015  . CESAREAN SECTION  1979  . KNEE ARTHROSCOPY Left 1990's    cartilage repair  . LEFT HEART CATHETERIZATION WITH CORONARY ANGIOGRAM N/A 05/20/2011   Procedure: LEFT HEART CATHETERIZATION WITH CORONARY ANGIOGRAM;  Surgeon: Josue Hector, MD;  Location: [facility] CATH LAB;  Service: Cardiovascular;  Laterality: N/A;  . MASTECTOMY Bilateral 1980's    bilateral with reconstruction   Family History  Problem Relation Age of Onset  . Lupus Mother        died early 73's.  . Emphysema Father        died late 70's.  [name] COPD Father   . Pancreatic cancer Brother   . Bladder Cancer Brother   . Rectal cancer Brother   . Suicidality Brother   . Colon cancer Neg Hx    Social History   Socioeconomic History  . Marital status: Widowed    Spouse name: Not on file  . Number of children: 1  . Years of education: 74  . Highest education level: Not on file  Occupational History  . Occupation: disabled  Tobacco Use  . Smoking status: Current Every Day Smoker    Packs/day: 0.75    Years: 45.00    Pack years: 33.75    Types: Cigarettes  . Smokeless tobacco: Never Used  . Tobacco comment: Currently smoking 1/2 pack per day  Vaping Use  . Vaping Use: Never used  Substance and Sexual Activity  . Alcohol use: Yes    Alcohol/week: 7.0 standard drinks    Types: 7 Glasses of wine per week    Comment: very occasional  . Drug use: No  . Sexual activity: Not Currently  Other Topics Concern  . Not on file  Social History Narrative   Pt lives alone.   Caffeine Use: 1-3 cups of caffeine daily (tea)   Social Determinants of Health   Financial Resource Strain: Low Risk   . Difficulty of Paying Living Expenses: Not hard at all  Food Insecurity: No Food Insecurity  . Worried About Charity fundraiser in the Last Year: Never true  . Ran Out of Food in the Last Year: Never true  Transportation Needs: No Transportation Needs  . Lack of Transportation (Medical): No  . Lack of Transportation (Non-Medical): No  Physical Activity: Unknown  . Days of Exercise per Week: Patient refused  . Minutes of Exercise per Session: Patient refused  Stress: No Stress Concern Present  . Feeling of Stress : Not at all  Social Connections: Socially Isolated  . Frequency of Communication with Friends and Family: Three times a week  . Frequency of Social  Gatherings with Friends and Family: Never  . Attends Religious Services: Never  . Active Member of Clubs or Organizations: No  . Attends Archivist Meetings: Never  . Marital Status: Widowed    Tobacco Counseling Ready to quit: Not Answered Counseling given: Not Answered Comment: Currently smoking 1/2 pack per day   Clinical Intake:  Pre-visit preparation completed: Yes  Pain : 0-10 Pain Score: 7  Pain Type: Chronic pain Pain Location: Back Pain Orientation: Lower  Pain Radiating Towards: lower extremity Pain Descriptors / Indicators: Sharp Pain Onset: More than a month ago Pain Frequency: Constant Pain Relieving Factors: Yes Effect of Pain on Daily Activities: Pain produces disability and affects the quality of life.  Pain Relieving Factors: Yes  BMI - recorded: 20.34 Nutritional Status: BMI of 19-24  Normal Nutritional Risks: Unintentional weight loss,Other (Comment) (low appetite) Diabetes: No  How often do you need to have someone help you when you read instructions, pamphlets, or other written materials from your doctor or pharmacy?: 1 - Never What is the last grade level you completed in school?: 11th grade  Diabetic? no  Interpreter Needed?: No  Information entered by :: Lisette Abu, LPN   Activities of Daily Living In your present state of health, do you have any difficulty performing the following activities: 02/08/2020  Hearing? N  Vision? N  Difficulty concentrating or making decisions? N  Walking or climbing stairs? N  Dressing or bathing? N  Doing errands, shopping? N  Preparing Food and eating ? N  Using the Toilet? N  In the past six months, have you accidently leaked urine? N  Do you have problems with loss of bowel control? N  Managing your Medications? N  Managing your Finances? N  Housekeeping or managing your Housekeeping? N  Some recent data might be hidden    Patient Care Team: Hoyt Koch, MD as PCP -  General (Internal Medicine) Letta Pate, Luanna Salk, MD as Consulting Physician (Physical Medicine and Rehabilitation) Charlton Haws, [facility] as Pharmacist (Pharmacist)  Indicate any recent Medical Services you may have received from other than [facility] providers in the past year (date may be approximate).     Assessment:   This is a routine wellness examination for [place].  Hearing/Vision screen No exam data present  Dietary issues and exercise activities discussed: Current Exercise Habits: The patient has a physically strenuous job, but has no regular exercise apart from work., Exercise limited by: psychological condition(s)  Goals    . Attending meditation / other stress management classes     Plan  1. You will go talk to a counselor regarding grief; loss and lonliness 2. Nurse will fup on resources; for household; food; [employer] silver sneakers; Senior center or social resources    . Be active and involved with my family and grandson. Continue to work, Nature conservation officer and go to [employer]    . Pharmacy Care Plan     CARE PLAN ENTRY (see longitudinal plan of care for additional care plan information)  Current Barriers:  . Chronic Disease Management support, education, and care coordination needs related to Hypertension, Hyperlipidemia, and Depression   Hypertension BP Readings from Last 3 Encounters:  05/24/19 (!) 148/66  05/16/19 124/72  04/26/19 117/73 .  Pharmacist Clinical Goal(s): o Over the next 90 days, patient will work with PharmD and providers to maintain BP goal <130/80 . Current regimen:  o No medication . Interventions: o Discussed BP goals and benefits of diet/exercise for prevention of heart attack / stroke . Patient self care activities - Over the next 90 days, patient will: o Check BP as needed, document, and provide at future appointments o Ensure daily salt intake < 2300 mg/day  Hyperlipidemia / CAD / TIA Lab Results  Component Value Date/Time   LDLCALC 39 10/12/2018  03:44 AM .  Pharmacist Clinical Goal(s): o Over the next 90 days, patient will work with PharmD and providers to maintain LDL goal < 70 . Current regimen:  o Atorvastatin 80 mg at bedtime o Aspirin 81 mg daily . Interventions: o Discussed cholesterol goals and benefits of medication for prevention of heart attack / stroke . Patient self care activities - Over the next 90 days, patient will: o Continue medication as prescribed  Depression . Pharmacist Clinical Goal(s) o Over the next 90 days, patient will work with PharmD and providers to optimize therapy . Current regimen:  o Duloxetine 60 mg daily o Aripiprazole 2 mg daily o Temazepam 15 mg at bedtime . Interventions: o Discussed benefits of medications . Patient self care activities - Over the next 90 days, patient will: o Continue medication as directed o Contact provider if depression or anxiety worsens  Medication management . Pharmacist Clinical Goal(s): o Over the next 90 days, patient will work with PharmD and providers to maintain optimal medication adherence . Current pharmacy: [facility] mail order . Interventions o Comprehensive medication review performed. o Continue current medication management strategy . Patient self care activities - Over the next 90 days, patient will: o Focus on medication adherence by fill date o Take medications as prescribed o Report any questions or concerns to PharmD and/or provider(s)  Please see past updates related to this goal by clicking on the "Past Updates" button in the selected goal       Depression Screen PHQ 2/9 Scores 02/08/2020 01/12/2019 07/21/2018 12/02/2016 09/01/2016 07/19/2016 05/24/2014  PHQ - 2 Score 6 3 1 6 2 5 5   PHQ- 9 Score 20 19 10 24 8 22 14   Exception Documentation - - - - - - Patient refusal    Fall Risk Fall Risk  02/08/2020 05/16/2019 10/24/2018 07/21/2018 02/17/2017  Falls in the past year? 0 0 0 1 Yes  Number falls in past yr: 0 0 - 0 2 or more  Injury with Fall?  0 0 - 0 Yes  Comment - - - - hit my head, didn't go to ED  Risk for fall due to : No Fall Risks;Other (Comment) - - - Other (Comment)  Risk for fall due to: Comment - - - - when getting out of bed  Follow up Falls evaluation completed;Education provided - - - -    FALL RISK PREVENTION PERTAINING TO THE HOME:  Any stairs in or around the home? Yes  If so, are there any without handrails? No  Home free of loose throw rugs in walkways, pet beds, electrical cords, etc? Yes  Adequate lighting in your home to reduce risk of falls? Yes   ASSISTIVE DEVICES UTILIZED TO PREVENT FALLS:  Life alert? No  Use of a cane, walker or w/c? No  Grab bars in the bathroom? Yes  Shower chair or bench in shower? Yes  Elevated toilet seat or a handicapped toilet? Yes   TIMED UP AND GO:  Was the test performed? No .  Length of time to ambulate 10 feet: 0 sec.   Gait steady and fast without use of assistive device  Cognitive Function: MMSE - Mini Mental State Exam 09/01/2016 05/24/2014  Orientation to time 5 5  Orientation to Place 5 5  Registration 3 3  Attention/ Calculation 5 5  Recall 3 3  Language- name 2 objects 2 2  Language- repeat 1 1  Language- follow 3 step command 3 3  Language- read & follow direction 1 1  Write a sentence 1 1  Copy design 1 1  Total score 30 30        Immunizations Immunization History  Administered Date(s) Administered  . Influenza Split 11/17/2011  . Influenza Whole 11/01/2013  . Influenza,inj,Quad PF,6+ Mos 09/23/2015, 10/28/2016, 10/17/2017, 09/27/2018, 10/11/2019  . Influenza-Unspecified 11/12/2014  . PFIZER SARS-COV-2 Vaccination 03/10/2019, 04/02/2019  . PPD Test 05/15/2014  . Pneumococcal Conjugate-13 10/11/2019  . Pneumococcal Polysaccharide-23 11/17/2011  . Tdap 12/20/2013  . Zoster 12/13/2013    TDAP status: Up to date  Flu Vaccine status: Up to date  Pneumococcal vaccine status: Up to date  Covid-19 vaccine status: Completed  vaccines  Qualifies for Shingles Vaccine? Yes   Zostavax completed Yes   Shingrix Completed?: No.    Education has been provided regarding the importance of this vaccine. Patient has been advised to call insurance company to determine out of pocket expense if they have not yet received this vaccine. Advised may also receive vaccine at local pharmacy or Health Dept. Verbalized acceptance and understanding.  Screening Tests Health Maintenance  Topic Date Due  . COVID-19 Vaccine (3 - Booster for Pfizer series) 10/03/2019  . PNA vac Low Risk Adult (2 of 2 - PPSV23) 10/10/2020  . TETANUS/TDAP  12/21/2023  . COLONOSCOPY (Pts 45-79yrs Insurance coverage will need to be confirmed)  12/17/2025  . INFLUENZA VACCINE  Completed  . DEXA SCAN  Completed  . Hepatitis C Screening  Completed    Health Maintenance  Health Maintenance Due  Topic Date Due  . COVID-19 Vaccine (3 - Booster for Pfizer series) 10/03/2019    Colorectal cancer screening: Type of screening: Colonoscopy. Completed 12/18/2015. Repeat every 10 years  Mammogram status: No longer required due to not a candidate for screening mammograms.  Bone Density status: Completed 02/02/2018. Results reflect: Bone density results: OSTEOPENIA. Repeat every 2 years.  Lung Cancer Screening: (Low Dose CT Chest recommended if Age 1-80 years, 30 pack-year currently smoking OR have quit w/in 15years.) does qualify.   Lung Cancer Screening Referral: no  Additional Screening:  Hepatitis C Screening: does qualify; Completed yes  Vision Screening: Recommended annual ophthalmology exams for early detection of glaucoma and other disorders of the eye. Is the patient up to date with their annual eye exam?  Yes  Who is the provider or what is the name of the office in which the patient attends annual eye exams? [facility] If pt is not established with a provider, would they like to be referred to a provider to establish care? No .   Dental  Screening: Recommended annual dental exams for proper oral hygiene  Community Resource Referral / Chronic Care Management: CRR required this visit?  No   CCM required this visit?  No      Plan:     I have personally reviewed and noted the following in the patient's chart:   . Medical and social history . Use of alcohol, tobacco or illicit drugs  . Current medications and supplements . Functional ability and status . Nutritional status . Physical activity . Advanced directives . List of other physicians . Hospitalizations, surgeries, and ER visits in previous 12 months . Vitals . Screenings to include cognitive, depression, and falls . Referrals and appointments  In addition, I have reviewed and discussed with patient certain preventive protocols, quality metrics, and best practice recommendations. A written personalized care plan for preventive services as well as general preventive health recommendations were provided to patient.     Sheral Flow, LPN   [id number]   Nurse Notes: n/a

## 2020-02-08 NOTE — Patient Instructions (Signed)
Anna Lyons , Thank you for taking time to come for your Medicare Wellness Visit. I appreciate your ongoing commitment to your health goals. Please review the following plan we discussed and let me know if I can assist you in the future.   Screening recommendations/referrals: Colonoscopy: 12/18/2015; due every 10 years Mammogram: not a candidate for mammograms Bone Density: 02/02/2018; due every 2 years Recommended yearly ophthalmology/optometry visit for glaucoma screening and checkup Recommended yearly dental visit for hygiene and checkup  Vaccinations: Influenza vaccine: 10/11/2019 Pneumococcal vaccine: up to date Tdap vaccine: 12/20/2013; due every 10 years Shingles vaccine: never done; can check with local pharmacy. Covid-19: up to date  Advanced directives: Please bring a copy of your health care power of attorney and living will to the office at your convenience.  Conditions/risks identified: Yes; Reviewed health maintenance screenings with patient today and relevant education, vaccines, and/or referrals were provided. Please continue to do your personal lifestyle choices by: daily care of teeth and gums, regular physical activity (goal should be 5 days a week for 30 minutes), eat a healthy diet, avoid tobacco and drug use, limiting any alcohol intake, taking a low-dose aspirin (if not allergic or have been advised by your provider otherwise) and taking vitamins and minerals as recommended by your provider. Continue doing brain stimulating activities (puzzles, reading, adult coloring books, staying active) to keep memory sharp. Continue to eat heart healthy diet (full of fruits, vegetables, whole grains, lean protein, water--limit salt, fat, and sugar intake) and increase physical activity as tolerated.  Next appointment: Please schedule your next Medicare Wellness Visit with your Nurse Health Advisor in 1 year by calling [phone].   Preventive Care 66 Years and Older, Female Preventive  care refers to lifestyle choices and visits with your health care provider that can promote health and wellness. What does preventive care include?  A yearly physical exam. This is also called an annual well check.  Dental exams once or twice a year.  Routine eye exams. Ask your health care provider how often you should have your eyes checked.  Personal lifestyle choices, including:  Daily care of your teeth and gums.  Regular physical activity.  Eating a healthy diet.  Avoiding tobacco and drug use.  Limiting alcohol use.  Practicing safe sex.  Taking low-dose aspirin every day.  Taking vitamin and mineral supplements as recommended by your health care provider. What happens during an annual well check? The services and screenings done by your health care provider during your annual well check will depend on your age, overall health, lifestyle risk factors, and family history of disease. Counseling  Your health care provider may ask you questions about your:  Alcohol use.  Tobacco use.  Drug use.  Emotional well-being.  Home and relationship well-being.  Sexual activity.  Eating habits.  History of falls.  Memory and ability to understand (cognition).  Work and work Statistician.  Reproductive health. Screening  You may have the following tests or measurements:  Height, weight, and BMI.  Blood pressure.  Lipid and cholesterol levels. These may be checked every 5 years, or more frequently if you are over 58 years old.  Skin check.  Lung cancer screening. You may have this screening every year starting at age 76 if you have a 30-pack-year history of smoking and currently smoke or have quit within the past 15 years.  Fecal occult blood test (FOBT) of the stool. You may have this test every year starting at age 70.  Flexible sigmoidoscopy or colonoscopy. You may have a sigmoidoscopy every 5 years or a colonoscopy every 10 years starting at age  77.  Hepatitis C blood test.  Hepatitis B blood test.  Sexually transmitted disease (STD) testing.  Diabetes screening. This is done by checking your blood sugar (glucose) after you have not eaten for a while (fasting). You may have this done every 1-3 years.  Bone density scan. This is done to screen for osteoporosis. You may have this done starting at age 63.  Mammogram. This may be done every 1-2 years. Talk to your health care provider about how often you should have regular mammograms. Talk with your health care provider about your test results, treatment options, and if necessary, the need for more tests. Vaccines  Your health care provider may recommend certain vaccines, such as:  Influenza vaccine. This is recommended every year.  Tetanus, diphtheria, and acellular pertussis (Tdap, Td) vaccine. You may need a Td booster every 10 years.  Zoster vaccine. You may need this after age 71.  Pneumococcal 13-valent conjugate (PCV13) vaccine. One dose is recommended after age 12.  Pneumococcal polysaccharide (PPSV23) vaccine. One dose is recommended after age 47. Talk to your health care provider about which screenings and vaccines you need and how often you need them. This information is not intended to replace advice given to you by your health care provider. Make sure you discuss any questions you have with your health care provider. Document Released: 02/14/2015 Document Revised: 10/08/2015 Document Reviewed: 11/19/2014 Elsevier Interactive Patient Education  2017 [employer] Prevention in the Home Falls can cause injuries. They can happen to people of all ages. There are many things you can do to make your home safe and to help prevent falls. What can I do on the outside of my home?  Regularly fix the edges of walkways and driveways and fix any cracks.  Remove anything that might make you trip as you walk through a door, such as a raised step or threshold.  Trim  any bushes or trees on the path to your home.  Use bright outdoor lighting.  Clear any walking paths of anything that might make someone trip, such as rocks or tools.  Regularly check to see if handrails are loose or broken. Make sure that both sides of any steps have handrails.  Any raised decks and porches should have guardrails on the edges.  Have any leaves, snow, or ice cleared regularly.  Use sand or salt on walking paths during winter.  Clean up any spills in your garage right away. This includes oil or grease spills. What can I do in the bathroom?  Use night lights.  Install grab bars by the toilet and in the tub and shower. Do not use towel bars as grab bars.  Use non-skid mats or decals in the tub or shower.  If you need to sit down in the shower, use a plastic, non-slip stool.  Keep the floor dry. Clean up any water that spills on the floor as soon as it happens.  Remove soap buildup in the tub or shower regularly.  Attach bath mats securely with double-sided non-slip rug tape.  Do not have throw rugs and other things on the floor that can make you trip. What can I do in the bedroom?  Use night lights.  Make sure that you have a light by your bed that is easy to reach.  Do not use any sheets or blankets  that are too big for your bed. They should not hang down onto the floor.  Have a firm chair that has side arms. You can use this for support while you get dressed.  Do not have throw rugs and other things on the floor that can make you trip. What can I do in the kitchen?  Clean up any spills right away.  Avoid walking on wet floors.  Keep items that you use a lot in easy-to-reach places.  If you need to reach something above you, use a strong step stool that has a grab bar.  Keep electrical cords out of the way.  Do not use floor polish or wax that makes floors slippery. If you must use wax, use non-skid floor wax.  Do not have throw rugs and other  things on the floor that can make you trip. What can I do with my stairs?  Do not leave any items on the stairs.  Make sure that there are handrails on both sides of the stairs and use them. Fix handrails that are broken or loose. Make sure that handrails are as long as the stairways.  Check any carpeting to make sure that it is firmly attached to the stairs. Fix any carpet that is loose or worn.  Avoid having throw rugs at the top or bottom of the stairs. If you do have throw rugs, attach them to the floor with carpet tape.  Make sure that you have a light switch at the top of the stairs and the bottom of the stairs. If you do not have them, ask someone to add them for you. What else can I do to help prevent falls?  Wear shoes that:  Do not have high heels.  Have rubber bottoms.  Are comfortable and fit you well.  Are closed at the toe. Do not wear sandals.  If you use a stepladder:  Make sure that it is fully opened. Do not climb a closed stepladder.  Make sure that both sides of the stepladder are locked into place.  Ask someone to hold it for you, if possible.  Clearly mark and make sure that you can see:  Any grab bars or handrails.  First and last steps.  Where the edge of each step is.  Use tools that help you move around (mobility aids) if they are needed. These include:  Canes.  Walkers.  Scooters.  Crutches.  Turn on the lights when you go into a dark area. Replace any light bulbs as soon as they burn out.  Set up your furniture so you have a clear path. Avoid moving your furniture around.  If any of your floors are uneven, fix them.  If there are any pets around you, be aware of where they are.  Review your medicines with your doctor. Some medicines can make you feel dizzy. This can increase your chance of falling. Ask your doctor what other things that you can do to help prevent falls. This information is not intended to replace advice given to  you by your health care provider. Make sure you discuss any questions you have with your health care provider. Document Released: 11/14/2008 Document Revised: 06/26/2015 Document Reviewed: 02/22/2014 Elsevier Interactive Patient Education  2017 [employer].

## 2020-02-11 ENCOUNTER — Ambulatory Visit (INDEPENDENT_AMBULATORY_CARE_PROVIDER_SITE_OTHER): Payer: Medicare HMO | Admitting: Psychology

## 2020-02-11 DIAGNOSIS — F3289 Other specified depressive episodes: Secondary | ICD-10-CM | POA: Diagnosis not present

## 2020-02-15 ENCOUNTER — Other Ambulatory Visit: Payer: Self-pay

## 2020-02-15 ENCOUNTER — Telehealth: Payer: Self-pay | Admitting: Internal Medicine

## 2020-02-15 ENCOUNTER — Encounter: Payer: Self-pay | Admitting: Internal Medicine

## 2020-02-15 ENCOUNTER — Ambulatory Visit (INDEPENDENT_AMBULATORY_CARE_PROVIDER_SITE_OTHER): Payer: Medicare HMO | Admitting: Internal Medicine

## 2020-02-15 DIAGNOSIS — F331 Major depressive disorder, recurrent, moderate: Secondary | ICD-10-CM

## 2020-02-15 MED ORDER — DULOXETINE HCL 60 MG PO CPEP: 60.0000 mg | ORAL_CAPSULE | Freq: Two times a day (BID) | ORAL | 3 refills | Status: DC

## 2020-02-15 NOTE — Patient Instructions (Signed)
We will increase the dose of the cymbalta to 2 pills daily. You can take them together or separate based on what you think is working best.

## 2020-02-15 NOTE — Progress Notes (Signed)
   Subjective:   Patient ID: Anna Lyons, female    DOB: 10-29-1955, 65 y.o.   MRN: [id number]  HPI The patient is a 65 YO female coming in for follow up depression. Not well controlled. Has been long term and she has tried many medications over the years. She is currently taking abilify and cymbalta. She is doing okay around others but getting very lonely when by herself. She is also finding it hard to motivate. She is going to therapy and working on getting into psych but is hard. Denies SI/HI.  Review of Systems  Constitutional: Negative.   HENT: Negative.   Eyes: Negative.   Respiratory: Negative for cough, chest tightness and shortness of breath.   Cardiovascular: Negative for chest pain, palpitations and leg swelling.  Gastrointestinal: Negative for abdominal distention, abdominal pain, constipation, diarrhea, nausea and vomiting.  Musculoskeletal: Negative.   Skin: Negative.   Neurological: Negative.   Psychiatric/Behavioral: Positive for decreased concentration, dysphoric mood and sleep disturbance.    Objective:  Physical Exam  Vitals:   02/15/20 1006  BP: 120/68  Pulse: 80  Temp: 98.4 F (36.9 C)  TempSrc: Oral  SpO2: 94%  Weight: 124 lb 9.6 oz (56.5 kg)  Height: 5\' 6"  (1.676 m)    This visit occurred during the SARS-CoV-2 public health emergency.  Safety protocols were in place, including screening questions prior to the visit, additional usage of staff PPE, and extensive cleaning of exam room while observing appropriate contact time as indicated for disinfecting solutions.   Assessment & Plan:  Visit time 20 minutes in face to face communication with patient and coordination of care, additional 10 minutes spent in record review, coordination or care, ordering tests, communicating/referring to other healthcare professionals, documenting in medical records all on the same day of the visit for total time 30 minutes spent on the visit.

## 2020-02-15 NOTE — Telephone Encounter (Signed)
Patient called and said that Hydroxychloroquine 200mg  was not on her medications list. She was wondering if it could be added. Dr. Harrel Carina prescribed it.

## 2020-02-15 NOTE — Assessment & Plan Note (Signed)
Increase cymbalta to 120 mg daily. Continue abilify and counseling. It sounds as though she is moderately controlled but still having bothersome symptoms.

## 2020-02-18 NOTE — Telephone Encounter (Signed)
We would need to call and clarify total daily dosing if this medication is once daily or frequency.

## 2020-02-18 NOTE — Telephone Encounter (Signed)
Left detailed message to call us back to verify directions on how patient is currently taking hydroxychloroquine.   I informed her the office is closed today due to weather and the office will re-open on 02/19/20 at noon.

## 2020-02-20 ENCOUNTER — Telehealth: Payer: Medicare HMO

## 2020-02-20 NOTE — Chronic Care Management (AMB) (Unsigned)
Chronic Care Management Pharmacy  Name: Anna Lyons  MRN: [id number] DOB: [birth year]   Chief Complaint/ HPI  Anna Lyons,  65 y.o. , female presents for their Follow-Up CCM visit with the clinical pharmacist via telephone due to COVID-19 Pandemic.  PCP : Hoyt Koch, MD Patient Care Team: Hoyt Koch, MD as PCP - General (Internal Medicine) Letta Pate Luanna Salk, MD as Consulting Physician (Physical Medicine and Rehabilitation) Charlton Haws, [facility] as Pharmacist (Pharmacist)  Their chronic conditions include: Hypertension, Coronary Artery Disease, Depression, Anxiety, Tobacco use and Seizure disorder, Hx TIA, RLS  Pt lives alone, works as a Public affairs consultant. Her husband passed 7 years ago, after which she got a chihuaha to keep her company. Her best friend passed a year ago, and she has had trouble spending time with new friends. She spends most of her time alone in her house in bed, she has very little appetite and reports depression is severe right now.  Office Visits: 02/15/20 Dr Sharlet Salina OV: f/u for depression. Moderately controlled with lingering sx. Increase duloxetine to 120 mg   10/11/19 Dr Ronnald Ramp OV: CPX. SBP 118, advised to stop HCTZ. Taper off Viibyrd and start duloxetine + Abilify  05/24/19 Dr Sharlet Salina OV: rx ibuprofen for claudication until neuro can evaluate. C/o cold hands, previously evaluated by rheum and felt smoking was the issue, not rheum disease  Consult Visit: 01/10/20 Dr Pearline Cables (rheum?): f/u for autoimmune disease? rx'd hydroxychloroquine per patient.   11/21/19 NP Eric Form (pulmonary): lung cancer screening.  06/14/19 Dr Saintclair Halsted (neurosurgery): via claims  04/26/19 NP Frann Rider (neurology): RLE paresthesias - rec'd gabapentin 300 mg HS, discontinue topiramate d/t no clear benefit and cognitive side effects.   Regular chiropractor visits.  Allergies  Allergen Reactions  . Codeine Itching  . Oxycodone-Acetaminophen Hives    Medications: Outpatient Encounter Medications as of 02/20/2020  Medication Sig  . albuterol (PROVENTIL HFA;VENTOLIN HFA) 108 (90 Base) MCG/ACT inhaler Inhale 2 puffs into the lungs every 6 (six) hours as needed for wheezing or shortness of breath. (Patient not taking: Reported on 02/15/2020)  . ARIPiprazole (ABILIFY) 2 MG tablet Take 1 tablet by mouth once daily  . aspirin EC 81 MG tablet Take 81 mg by mouth daily.  [name] atorvastatin (LIPITOR) 80 MG tablet Take 1 tablet (80 mg total) by mouth at bedtime.  . cholecalciferol (VITAMIN D3) 25 MCG (1000 UNIT) tablet Take 1,000 Units by mouth daily.  . DULoxetine (CYMBALTA) 60 MG capsule Take 1 capsule (60 mg total) by mouth 2 (two) times daily.  . ferrous sulfate 324 MG TBEC Take 324 mg by mouth.  . gabapentin (NEURONTIN) 300 MG capsule Take 1 capsule (300 mg total) by mouth at bedtime.  . levETIRAcetam (KEPPRA) 500 MG tablet TAKE 1 TABLET TWICE DAILY  . Magnesium 250 MG TABS Take by mouth.  . Multiple Vitamin (MULTIVITAMIN WITH MINERALS) TABS tablet Take 1 tablet by mouth daily.  . pramipexole (MIRAPEX) 0.5 MG tablet TAKE 1 TABLET (0.5 MG TOTAL) BY MOUTH AT BEDTIME AS NEEDED.  [name] temazepam (RESTORIL) 15 MG capsule TAKE 1-2 AT BEDTIME AS NEEDED FOR SLEEP  . vitamin B-12 (CYANOCOBALAMIN) 1000 MCG tablet Take 1,000 mcg by mouth daily.   No facility-administered encounter medications on file as of 02/20/2020.   Wt Readings from Last 3 Encounters:  02/15/20 124 lb 9.6 oz (56.5 kg)  02/08/20 126 lb (57.2 kg)  11/21/19 126 lb 13.9 oz (57.5 kg)   Lab Results  Component Value  Date   CREATININE 0.74 10/11/2019   BUN 17 10/11/2019   GFR 83.96 02/28/2019   GFRNONAA 85 10/11/2019   GFRAA 99 10/11/2019   NA 138 10/11/2019   K 3.8 10/11/2019   CALCIUM 9.6 10/11/2019   CO2 26 10/11/2019   Current Diagnosis/Assessment:    Goals Addressed   None     Hypertension   BP goal is:  <130/80  Office blood pressures are  BP Readings from Last 3  Encounters:  02/15/20 120/68  02/08/20 116/70  11/21/19 120/72   Patient checks BP at home infrequently Patient home BP readings are ranging: n/a  Patient has failed these meds in the past: triamterene-HCTZ, Patient is currently controlled on the following medications:  . No medications  We discussed BP goals; HCTZ was recently discontinue due to overcontrolled BP. Pt does not endorse issues with BP at home.  Plan  Continue current medications and control with diet and exercise     Hyperlipidemia / CAD   NSTEMI 2014, TIA 2015 LDL goal < 70  Lipid Panel     Component Value Date/Time   CHOL 118 10/11/2019 1353   TRIG 61 10/11/2019 1353   HDL 59 10/11/2019 1353   LDLCALC 45 10/11/2019 1353    Hepatic Function Latest Ref Rng & Units 10/11/2019 02/28/2019 11/30/2018  Total Protein 6.1 - 8.1 g/dL 6.7 7.1 6.6  Albumin 3.5 - 5.2 g/dL - 4.1 3.7  AST 10 - 35 U/L '24 25 24  ' ALT 6 - 29 U/L '17 20 20  ' Alk Phosphatase 39 - 117 U/L - 76 67  Total Bilirubin 0.2 - 1.2 mg/dL 0.5 0.6 0.8  Bilirubin, Direct 0.0 - 0.2 mg/dL 0.1 - -     The ASCVD Risk score ([place]., et al., 2013) failed to calculate for the following reasons:   The patient has a prior MI or stroke diagnosis   Patient has failed these meds in past: n/a Patient is currently controlled on the following medications:  . Atorvastatin 80 mg HS . Aspirin 81 mg daily  We discussed:  Cholesterol goals; benefits of statin for ASCVD risk reduction  Plan  Continue current medications   Depression / PTSD   Depression screen [facility] 2/9 02/15/2020 02/08/2020 01/12/2019  Decreased Interest '1 3 2  ' Down, Depressed, Hopeless '2 3 1  ' PHQ - 2 Score '3 6 3  ' Altered sleeping - 3 3  Tired, decreased energy - 3 3  Change in appetite - 3 3  Feeling bad or failure about yourself  - 2 1  Trouble concentrating - 2 2  Moving slowly or fidgety/restless - 1 3  Suicidal thoughts - 0 1  PHQ-9 Score - 20 19  Difficult doing work/chores -  Somewhat difficult -  Some recent data might be hidden   Patient has failed these meds in past: bupropion, duloxetine, escitalopram, fluoxetine, mirtazapine, sertraline, trazodone, [place] Patient is currently uncontrolled on the following medications:  . Duloxetine 60 mg daily . Aripiprazole 2 mg daily . Temazepam 15 mg HS  We discussed: Pt reports improvement in mood since starting new medication; she denies issues  Plan  Continue current medications   Medication Management   Pt uses [facility] mail order pharmacy for all medications Uses pill box? No - prefers bottles Pt endorses 100% compliance  We discussed: All medications are free through [facility]. Pt is satisfied with pharmacy services.  Plan  Continue current medication management strategy   Follow up: *** month phone  visit  Charlene Brooke, PharmD, BCACP Clinical Pharmacist [facility] [phone]

## 2020-02-27 ENCOUNTER — Ambulatory Visit (INDEPENDENT_AMBULATORY_CARE_PROVIDER_SITE_OTHER): Payer: Medicare HMO | Admitting: Psychology

## 2020-02-27 DIAGNOSIS — F3289 Other specified depressive episodes: Secondary | ICD-10-CM

## 2020-02-28 ENCOUNTER — Other Ambulatory Visit: Payer: Self-pay | Admitting: Internal Medicine

## 2020-02-29 DIAGNOSIS — M8588 Other specified disorders of bone density and structure, other site: Secondary | ICD-10-CM | POA: Diagnosis not present

## 2020-02-29 DIAGNOSIS — N958 Other specified menopausal and perimenopausal disorders: Secondary | ICD-10-CM | POA: Diagnosis not present

## 2020-03-07 ENCOUNTER — Other Ambulatory Visit: Payer: Self-pay | Admitting: Neurology

## 2020-03-07 ENCOUNTER — Other Ambulatory Visit: Payer: Self-pay | Admitting: Internal Medicine

## 2020-03-17 ENCOUNTER — Other Ambulatory Visit: Payer: Self-pay | Admitting: Internal Medicine

## 2020-03-18 ENCOUNTER — Ambulatory Visit: Payer: Medicare HMO | Admitting: Psychology

## 2020-03-27 ENCOUNTER — Ambulatory Visit (INDEPENDENT_AMBULATORY_CARE_PROVIDER_SITE_OTHER): Payer: Medicare HMO | Admitting: Pharmacist

## 2020-03-27 ENCOUNTER — Other Ambulatory Visit: Payer: Self-pay

## 2020-03-27 DIAGNOSIS — F332 Major depressive disorder, recurrent severe without psychotic features: Secondary | ICD-10-CM

## 2020-03-27 DIAGNOSIS — I10 Essential (primary) hypertension: Secondary | ICD-10-CM | POA: Diagnosis not present

## 2020-03-27 DIAGNOSIS — I2510 Atherosclerotic heart disease of native coronary artery without angina pectoris: Secondary | ICD-10-CM

## 2020-03-27 DIAGNOSIS — E785 Hyperlipidemia, unspecified: Secondary | ICD-10-CM

## 2020-03-27 NOTE — Patient Instructions (Signed)
Visit Information  Phone number for Pharmacist: [phone]  Goals Addressed   None    Patient Care Plan: CCM Pharmacy Care Plan    Problem Identified: Hypertension, Hyperlipidemia, Coronary Artery Disease and Depression   Priority: High    Long-Range Goal: Disease management   Start Date: 03/27/2020  Expected End Date: 03/27/2021  This Visit's Progress: On track  Priority: High  Note:   Current Barriers:  . Unable to independently monitor therapeutic efficacy . Unable to maintain control of depression  Pharmacist Clinical Goal(s):  [name] Over the next 180 days, patient will achieve adherence to monitoring guidelines and medication adherence to achieve therapeutic efficacy . maintain control of depression as evidenced by patient report  through collaboration with PharmD and provider.   Interventions: . 1:1 collaboration with Hoyt Koch, MD regarding development and update of comprehensive plan of care as evidenced by provider attestation and co-signature . Inter-disciplinary care team collaboration (see longitudinal plan of care) . Comprehensive medication review performed; medication list updated in electronic medical record  Hypertension (BP goal < 130/80) Controlled without medication Interventions: ? Discussed BP goals and benefits of diet/exercise for prevention of heart attack / stroke   Hyperlipidemia / CAD / TIA (LDL goal < 70) Controlled  Current regimen:  ? Atorvastatin 80 mg at bedtime ? Aspirin 81 mg daily Interventions: ? Discussed cholesterol goals and benefits of medication for prevention of heart attack / stroke   Depression Controlled - pt reports improvement since increasing duloxetine to 120 mg Current regimen:  ? Duloxetine 120 mg daily ? Aripiprazole 2 mg daily ? Temazepam 15 mg at bedtime Interventions: ? Discussed benefits of medications ? Discussed new eye twitch may be exacerbated by higher duloxetine dose; however timing does not  match with when dose was increased, more likely related to stress. Advised patient to continue to monitor/see if it improves, if it gets worse she may contact PCP ? Coordinate with PCP to refill temazepam to local pharmacy - mail order supply will not come for 10-14 more days and pt will be out tomorrow ? Advised to contact local psych groups for therapy  Patient Goals/Self-Care Activities . Over the next 180 days, patient will:  - take medications as prescribed focus on medication adherence by pill box  Follow Up Plan: Telephone follow up appointment with care management team member scheduled for: 6 months      The patient verbalized understanding of instructions, educational materials, and care plan provided today and declined offer to receive copy of patient instructions, educational materials, and care plan.  Telephone follow up appointment with pharmacy team member scheduled for: 6 months  Charlene Brooke, PharmD, [facility] Clinical Pharmacist [facility] [facility] [phone]

## 2020-03-27 NOTE — Progress Notes (Signed)
Chronic Care Management Pharmacy Note  03/27/2020 Name:  Anna Lyons MRN:  [id number] DOB:  [birth year]  Subjective: Anna Lyons is an 65 y.o. year old female who is a primary patient of Hoyt Koch, MD.  The CCM team was consulted for assistance with disease management and care coordination needs.    Engaged with patient by telephone for follow up visit in response to provider referral for pharmacy case management and/or care coordination services.   Consent to Services:  The patient was given the following information about Chronic Care Management services today, agreed to services, and gave verbal consent: 1. CCM service includes personalized support from designated clinical staff supervised by the primary care provider, including individualized plan of care and coordination with other care providers 2. 24/7 contact phone numbers for assistance for urgent and routine care needs. 3. Service will only be billed when office clinical staff spend 20 minutes or more in a month to coordinate care. 4. Only one practitioner may furnish and bill the service in a calendar month. 5.The patient may stop CCM services at any time (effective at the end of the month) by phone call to the office staff. 6. The patient will be responsible for cost sharing (co-pay) of up to 20% of the service fee (after annual deductible is met). Patient agreed to services and consent obtained.  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 Her husband passed 7 years ago, after which she got a chihuaha to keep her company. Her best friend passed a year ago, and she has had trouble spending time with new friends.  She reports improvement in mood since changing meds. She will be out of  temezapem tomorrow but mail order won't deliver for 10-14 days, she requests a 2-week supply be sent to local [place].  Patient also reports eye twitch that started about a week ago - not painful but it is annoying. She notes her rent was just increased and this has been frustrating/stressful for her.  Recent office visits: 02/15/20 Dr Sharlet Salina OV: f/u depression, not well controlled. Increase duloxetine to 120 mg daily.  10/11/19 Dr Ronnald Ramp OV: CPX. SBP 118, advised to stop HCTZ. Taper off Viibyrd and start duloxetine + Abilify  05/24/19 Dr Sharlet Salina OV: rx ibuprofen for claudication until neuro can evaluate. C/o cold hands, previously evaluated by rheum and felt smoking was the issue, not rheum disease  Recent consult visits: none  Hospital visits: None in previous 6 months  Objective:  Lab Results  Component Value Date   CREATININE 0.74 10/11/2019   BUN 17 10/11/2019   GFR 83.96 02/28/2019   GFRNONAA 85 10/11/2019   GFRAA 99 10/11/2019   NA 138 10/11/2019   K 3.8 10/11/2019   CALCIUM 9.6 10/11/2019   CO2 26 10/11/2019    Lab Results  Component Value Date/Time   HGBA1C 5.3 10/12/2018 03:44 AM   HGBA1C 5.6 09/27/2018 10:13 AM   GFR 83.96 02/28/2019 10:15 AM   GFR 62.91 09/27/2018 10:13 AM    Last diabetic Eye exam: No results found for: HMDIABEYEEXA  Last diabetic Foot exam: No results found for: HMDIABFOOTEX   Lab Results  Component Value Date   CHOL 118 10/11/2019   HDL 59 10/11/2019   LDLCALC 45 10/11/2019   TRIG 61  10/11/2019   CHOLHDL 2.0 10/11/2019    Hepatic Function Latest Ref Rng & Units 10/11/2019 02/28/2019 11/30/2018  Total Protein 6.1 - 8.1 g/dL 6.7 7.1 6.6  Albumin 3.5 - 5.2 g/dL - 4.1 3.7  AST 10 - 35 U/L '24 25 24  ' ALT 6 - 29 U/L '17 20 20  ' Alk Phosphatase 39 - 117 U/L - 76 67  Total Bilirubin 0.2 - 1.2 mg/dL 0.5 0.6 0.8  Bilirubin, Direct 0.0 - 0.2 mg/dL 0.1 - -    Lab Results  Component Value Date/Time   TSH 1.96 10/11/2019 01:53 PM    TSH 2.19 02/28/2019 10:15 AM   FREET4 0.88 09/27/2018 10:13 AM   FREET4 0.70 10/15/2015 11:08 AM    CBC Latest Ref Rng & Units 10/11/2019 02/28/2019 11/30/2018  WBC 3.8 - 10.8 Thousand/uL 4.6 4.8 5.0  Hemoglobin 11.7 - 15.5 g/dL 12.8 13.4 11.4(L)  Hematocrit 35.0 - 45.0 % 38.2 39.5 33.7(L)  Platelets 140 - 400 Thousand/uL 189 229.0 199    Lab Results  Component Value Date/Time   VD25OH 57.29 02/28/2019 10:15 AM   VD25OH 52.48 09/27/2018 10:13 AM    Clinical ASCVD: Yes  The ASCVD Risk score Mikey Bussing DC Jr., et al., 2013) failed to calculate for the following reasons:   The patient has a prior MI or stroke diagnosis    Depression screen [facility] 2/9 02/15/2020 02/08/2020 01/12/2019  Decreased Interest '1 3 2  ' Down, Depressed, Hopeless '2 3 1  ' PHQ - 2 Score '3 6 3  ' Altered sleeping - 3 3  Tired, decreased energy - 3 3  Change in appetite - 3 3  Feeling bad or failure about yourself  - 2 1  Trouble concentrating - 2 2  Moving slowly or fidgety/restless - 1 3  Suicidal thoughts - 0 1  PHQ-9 Score - 20 19  Difficult doing work/chores - Somewhat difficult -  Some recent data might be hidden     Social History   Tobacco Use  Smoking Status Current Every Day Smoker  . Packs/day: 0.75  . Years: 45.00  . Pack years: 33.75  . Types: Cigarettes  Smokeless Tobacco Never Used  Tobacco Comment   Currently smoking 1/2 pack per day   BP Readings from Last 3 Encounters:  02/15/20 120/68  02/08/20 116/70  11/21/19 120/72   Pulse Readings from Last 3 Encounters:  02/15/20 80  02/08/20 70  11/21/19 65   Wt Readings from Last 3 Encounters:  02/15/20 124 lb 9.6 oz (56.5 kg)  02/08/20 126 lb (57.2 kg)  11/21/19 126 lb 13.9 oz (57.5 kg)    Assessment/Interventions: Review of patient past medical history, allergies, medications, health status, including review of consultants reports, laboratory and other test data, was performed as part of comprehensive evaluation and provision of chronic care  management services.   SDOH:  (Social Determinants of Health) assessments and interventions performed: Yes   CCM Care Plan  Allergies  Allergen Reactions  . Codeine Itching  . Oxycodone-Acetaminophen Hives    Medications Reviewed Today    Reviewed by Hoyt Koch, MD (Physician) on 02/15/20 at 1211  Med List Status: <None>  Medication Order Taking? Sig Documenting Provider Last Dose Status Informant  albuterol (PROVENTIL HFA;VENTOLIN HFA) 108 (90 Base) MCG/ACT inhaler [id number] No Inhale 2 puffs into the lungs every 6 (six) hours as needed for wheezing or shortness of breath.  Patient not taking: Reported on 02/15/2020   Hoyt Koch, MD Not Taking Active Self  ARIPiprazole (ABILIFY) 2 MG tablet [id number] Yes Take 1 tablet by mouth once daily Janith Lima, MD Taking Active   aspirin EC 81 MG tablet [id number] Yes Take 81 mg by mouth daily. [provider] Taking Active Self  atorvastatin (LIPITOR) 80 MG tablet [id number] Yes Take 1 tablet (80 mg total) by mouth at bedtime. Hoyt Koch, MD Taking Active   cholecalciferol (VITAMIN D3) 25 MCG (1000 UNIT) tablet [id number] Yes Take 1,000 Units by mouth daily. [provider] Taking Active   DULoxetine (CYMBALTA) 60 MG capsule [id number]  Take 1 capsule (60 mg total) by mouth 2 (two) times daily. Hoyt Koch, MD  Active   ferrous sulfate 324 MG TBEC [id number] Yes Take 324 mg by mouth. [provider] Taking Active   gabapentin (NEURONTIN) 300 MG capsule [id number] Yes Take 1 capsule (300 mg total) by mouth at bedtime. Frann Rider, NP Taking Active   levETIRAcetam (KEPPRA) 500 MG tablet [id number] Yes TAKE 1 TABLET TWICE DAILY Hoyt Koch, MD Taking Active   Magnesium 250 MG TABS [id number] Yes Take by mouth. [provider] Taking Active   Multiple Vitamin (MULTIVITAMIN WITH MINERALS) TABS tablet [id number] Yes Take 1 tablet by mouth daily. [provider] Taking Active Self  pramipexole (MIRAPEX) 0.5 MG tablet [id number] Yes TAKE 1 TABLET (0.5 MG TOTAL) BY MOUTH AT BEDTIME AS NEEDED. Hoyt Koch, MD Taking Active   temazepam (RESTORIL) 15 MG capsule [id number] Yes TAKE 1-2 AT BEDTIME AS NEEDED FOR SLEEP Hoyt Koch, MD Taking Active   vitamin B-12 (CYANOCOBALAMIN) 1000 MCG tablet [id number] Yes Take 1,000 mcg by mouth daily. [provider] Taking Active           Patient Active Problem List   Diagnosis Date Noted  . Dyslipidemia, goal LDL below 70 10/11/2019  . B12 deficiency 10/11/2019  . Need for hepatitis C screening test 10/11/2019  . Need for shingles vaccine 10/11/2019  . Severe episode of recurrent major depressive disorder, with psychotic features ([place]) 10/11/2019  . Claudication ([place]) 02/28/2019  . PTSD (post-traumatic stress disorder) 04/01/2017  . Routine general medical examination at a health care facility 11/15/2014  . TIA (transient ischemic attack) 08/16/2013  . Hemiparesis affecting right side as late effect of cerebrovascular accident ([place]) 06/18/2013  . Seizure disorder ([place]) 05/19/2012  . NSTEMI, initial episode of care; Type 2 05/19/2012  . Hypokalemia 11/16/2011  . RLS (restless legs syndrome) 11/16/2011  . Essential hypertension, benign 05/19/2011  . Tobacco abuse 05/19/2011  . Major depression, recurrent ([place])     Immunization History  Administered Date(s) Administered  . Influenza Split 11/17/2011  . Influenza Whole 11/01/2013  . Influenza,inj,Quad PF,6+ Mos 09/23/2015, 10/28/2016, 10/17/2017, 09/27/2018, 10/11/2019  . Influenza-Unspecified 11/12/2014  . PFIZER(Purple Top)SARS-COV-2 Vaccination 03/10/2019, 04/02/2019  . PPD Test 05/15/2014  . Pneumococcal Conjugate-13 10/11/2019  . Pneumococcal Polysaccharide-23 11/17/2011  . Tdap 12/20/2013  . Zoster 12/13/2013    Conditions to be addressed/monitored:  Hypertension, Hyperlipidemia, Coronary Artery Disease and  Depression  Care Plan : [place]  Updates made by Charlton Haws, [place] since 03/27/2020 12:00 AM    Problem: Hypertension, Hyperlipidemia, Coronary Artery Disease and Depression   Priority: High    Long-Range Goal: Disease management   Start Date: 03/27/2020  Expected End Date: 03/27/2021  This Visit's Progress: On track  Priority: High  Note:   Current Barriers:  . Unable to independently monitor therapeutic efficacy . Unable to maintain control of  depression  Pharmacist Clinical Goal(s):  [name] Over the next 180 days, patient will achieve adherence to monitoring guidelines and medication adherence to achieve therapeutic efficacy . maintain control of depression as evidenced by patient report  through collaboration with PharmD and provider.   Interventions: . 1:1 collaboration with Hoyt Koch, MD regarding development and update of comprehensive plan of care as evidenced by provider attestation and co-signature . Inter-disciplinary care team collaboration (see longitudinal plan of care) . Comprehensive medication review performed; medication list updated in electronic medical record  Hypertension (BP goal < 130/80) Controlled without medication Interventions: ? Discussed BP goals and benefits of diet/exercise for prevention of heart attack / stroke   Hyperlipidemia / CAD / TIA (LDL goal < 70) Controlled  Current regimen:  ? Atorvastatin 80 mg at bedtime ? Aspirin 81 mg daily Interventions: ? Discussed cholesterol goals and benefits of medication for prevention of heart attack / stroke   Depression Controlled - pt reports improvement since increasing duloxetine to 120 mg Current regimen:  ? Duloxetine 120 mg daily ? Aripiprazole 2 mg daily ? Temazepam 15 mg at bedtime Interventions: ? Discussed benefits of medications ? Discussed new eye twitch may be exacerbated by higher duloxetine dose; however timing does not match with when dose was  increased, more likely related to stress. Advised patient to continue to monitor/see if it improves, if it gets worse she may contact PCP ? Coordinate with PCP to refill temazepam to local pharmacy - mail order supply will not come for 10-14 more days and pt will be out tomorrow ? Advised to contact local psych groups for therapy  Patient Goals/Self-Care Activities . Over the next 180 days, patient will:  - take medications as prescribed focus on medication adherence by pill box  Follow Up Plan: Telephone follow up appointment with care management team member scheduled for: 6 months      Medication Assistance: None required.  Patient affirms current coverage meets needs.  Patient's preferred pharmacy is:  [facility], [place] [address] Phone: [phone] Fax: [phone]  [facility], [place] [address] Phone: [phone] Fax: [phone]  Uses pill box? Yes Pt endorses 100% compliance  We discussed: Current pharmacy is preferred with insurance plan and patient is satisfied with pharmacy services Patient decided to: Continue current medication management strategy  Care Plan and Follow Up Patient Decision:  Patient agrees to Care Plan and Follow-up.  Plan: Telephone follow up appointment with care management team member scheduled for:  6 months  Charlene Brooke, PharmD, [facility] Clinical Pharmacist [facility] [phone]

## 2020-03-28 ENCOUNTER — Other Ambulatory Visit: Payer: Self-pay | Admitting: Internal Medicine

## 2020-03-28 MED ORDER — TEMAZEPAM 15 MG PO CAPS: ORAL_CAPSULE | ORAL | 0 refills | Status: DC

## 2020-04-02 ENCOUNTER — Ambulatory Visit (INDEPENDENT_AMBULATORY_CARE_PROVIDER_SITE_OTHER): Payer: Medicare HMO | Admitting: Psychology

## 2020-04-02 DIAGNOSIS — F3289 Other specified depressive episodes: Secondary | ICD-10-CM | POA: Diagnosis not present

## 2020-04-08 ENCOUNTER — Other Ambulatory Visit: Payer: Self-pay | Admitting: Internal Medicine

## 2020-04-08 ENCOUNTER — Other Ambulatory Visit: Payer: Self-pay | Admitting: Adult Health

## 2020-04-08 ENCOUNTER — Other Ambulatory Visit: Payer: Self-pay | Admitting: Neurology

## 2020-04-09 ENCOUNTER — Encounter: Payer: Self-pay | Admitting: *Deleted

## 2020-04-09 ENCOUNTER — Other Ambulatory Visit: Payer: Self-pay | Admitting: Internal Medicine

## 2020-04-09 DIAGNOSIS — F331 Major depressive disorder, recurrent, moderate: Secondary | ICD-10-CM

## 2020-04-14 ENCOUNTER — Telehealth: Payer: Self-pay | Admitting: Internal Medicine

## 2020-04-14 DIAGNOSIS — F331 Major depressive disorder, recurrent, moderate: Secondary | ICD-10-CM

## 2020-04-14 MED ORDER — DULOXETINE HCL 60 MG PO CPEP: 60.0000 mg | ORAL_CAPSULE | Freq: Every day | ORAL | 0 refills | Status: DC

## 2020-04-14 NOTE — Telephone Encounter (Signed)
Medication has been sent to the pharmacy. 

## 2020-04-14 NOTE — Telephone Encounter (Signed)
Patient states she has no DULoxetine (CYMBALTA) 60 MG capsule. She is waiting on mail order to send, but they state no available until 3/21  She is requesting short supply be sent to [facility] [address], [place]

## 2020-04-14 NOTE — Telephone Encounter (Signed)
Fine to send

## 2020-04-14 NOTE — Telephone Encounter (Signed)
Ok to send a short term script? Please advise

## 2020-04-16 ENCOUNTER — Other Ambulatory Visit: Payer: Self-pay | Admitting: Internal Medicine

## 2020-04-21 ENCOUNTER — Other Ambulatory Visit: Payer: Self-pay | Admitting: Internal Medicine

## 2020-04-21 DIAGNOSIS — F331 Major depressive disorder, recurrent, moderate: Secondary | ICD-10-CM

## 2020-04-21 DIAGNOSIS — F333 Major depressive disorder, recurrent, severe with psychotic symptoms: Secondary | ICD-10-CM

## 2020-04-22 ENCOUNTER — Ambulatory Visit (INDEPENDENT_AMBULATORY_CARE_PROVIDER_SITE_OTHER): Payer: Medicare HMO | Admitting: Psychology

## 2020-04-22 DIAGNOSIS — F3289 Other specified depressive episodes: Secondary | ICD-10-CM

## 2020-04-29 DIAGNOSIS — H35363 Drusen (degenerative) of macula, bilateral: Secondary | ICD-10-CM | POA: Diagnosis not present

## 2020-04-29 DIAGNOSIS — H524 Presbyopia: Secondary | ICD-10-CM | POA: Diagnosis not present

## 2020-05-06 ENCOUNTER — Ambulatory Visit (INDEPENDENT_AMBULATORY_CARE_PROVIDER_SITE_OTHER): Payer: Medicare HMO | Admitting: Psychology

## 2020-05-06 DIAGNOSIS — F3289 Other specified depressive episodes: Secondary | ICD-10-CM | POA: Diagnosis not present

## 2020-05-11 ENCOUNTER — Other Ambulatory Visit: Payer: Self-pay | Admitting: Internal Medicine

## 2020-05-11 DIAGNOSIS — F331 Major depressive disorder, recurrent, moderate: Secondary | ICD-10-CM

## 2020-05-14 ENCOUNTER — Other Ambulatory Visit: Payer: Self-pay | Admitting: Internal Medicine

## 2020-05-15 NOTE — Telephone Encounter (Signed)
Patient is requesting a refill of the following medications: Requested Prescriptions   Pending Prescriptions Disp Refills  . DULoxetine (CYMBALTA) 60 MG capsule [Pharmacy Med Name: DULOXETINE HCL DR 60 MG CAPSULE] 30 capsule 0    Sig: TAKE ONE CAPSULE BY MOUTH DAILY    Date of patient request: 04/14/20  Last office visit:02/15/20   Date of last refill: 04/14/20 Last refill amount: 30 Follow up time period per chart: 09/24/20

## 2020-05-22 ENCOUNTER — Ambulatory Visit (INDEPENDENT_AMBULATORY_CARE_PROVIDER_SITE_OTHER): Payer: Medicare HMO | Admitting: Psychology

## 2020-05-22 DIAGNOSIS — F3289 Other specified depressive episodes: Secondary | ICD-10-CM

## 2020-06-03 ENCOUNTER — Telehealth: Payer: Self-pay | Admitting: Internal Medicine

## 2020-06-03 NOTE — Telephone Encounter (Signed)
Unable to get in contact with the patient. LVM asking the patient to return my call here at the office to discuss her Temazepam refill. Office number was provided.

## 2020-06-03 NOTE — Telephone Encounter (Signed)
See below

## 2020-06-03 NOTE — Telephone Encounter (Signed)
Is she still taking this? If so how often, last filled for 28 pills in Feb?

## 2020-06-03 NOTE — Telephone Encounter (Signed)
temazepam (RESTORIL) 15 MG capsule Patient wondering if we can send in a short supply until her script from [facility] comes in [facility] [address] - [place], [place] Phone:  [phone]  Fax:  [phone]

## 2020-06-05 ENCOUNTER — Ambulatory Visit (INDEPENDENT_AMBULATORY_CARE_PROVIDER_SITE_OTHER): Payer: Medicare HMO | Admitting: Psychology

## 2020-06-05 DIAGNOSIS — F3289 Other specified depressive episodes: Secondary | ICD-10-CM | POA: Diagnosis not present

## 2020-06-09 ENCOUNTER — Other Ambulatory Visit: Payer: Self-pay | Admitting: Internal Medicine

## 2020-06-19 ENCOUNTER — Ambulatory Visit (INDEPENDENT_AMBULATORY_CARE_PROVIDER_SITE_OTHER): Payer: Medicare HMO | Admitting: Psychology

## 2020-06-19 DIAGNOSIS — F3289 Other specified depressive episodes: Secondary | ICD-10-CM | POA: Diagnosis not present

## 2020-06-25 ENCOUNTER — Telehealth: Payer: Self-pay | Admitting: Pharmacist

## 2020-06-25 NOTE — Progress Notes (Signed)
    Chronic Care Management Pharmacy Assistant   Name: Anna Lyons  MRN: [id number] DOB: Dec 30, 1955  Reason for Encounter: Disease State - General Adherence   Recent office visits:  None noted.  Recent consult visits:  04/29/20 [place] ([facility]) - [place]en of macula, bilateral.  Hospital visits:  None in previous 6 months  Medications: Outpatient Encounter Medications as of 06/25/2020  Medication Sig  . albuterol (PROVENTIL HFA;VENTOLIN HFA) 108 (90 Base) MCG/ACT inhaler Inhale 2 puffs into the lungs every 6 (six) hours as needed for wheezing or shortness of breath.  . ARIPiprazole (ABILIFY) 2 MG tablet TAKE 1 TABLET DAILY.  [name] aspirin EC 81 MG tablet Take 81 mg by mouth daily.  [name] atorvastatin (LIPITOR) 80 MG tablet TAKE 1 TABLET (80 MG TOTAL) BY MOUTH AT BEDTIME.  . cholecalciferol (VITAMIN D3) 25 MCG (1000 UNIT) tablet Take 1,000 Units by mouth daily.  . DULoxetine (CYMBALTA) 60 MG capsule TAKE ONE CAPSULE BY MOUTH DAILY  . ferrous sulfate 324 MG TBEC Take 324 mg by mouth.  . gabapentin (NEURONTIN) 300 MG capsule TAKE 1 CAPSULE (300 MG TOTAL) BY MOUTH AT BEDTIME.  [name] levETIRAcetam (KEPPRA) 500 MG tablet TAKE 1 TABLET TWICE DAILY  . Magnesium 250 MG TABS Take by mouth.  . Multiple Vitamin (MULTIVITAMIN WITH MINERALS) TABS tablet Take 1 tablet by mouth daily.  . pramipexole (MIRAPEX) 0.5 MG tablet TAKE 1 TABLET (0.5 MG TOTAL) BY MOUTH AT BEDTIME AS NEEDED.  [name] temazepam (RESTORIL) 15 MG capsule TAKE 1-2 AT BEDTIME AS NEEDED FOR SLEEP  . vitamin B-12 (CYANOCOBALAMIN) 1000 MCG tablet Take 1,000 mcg by mouth daily.   No facility-administered encounter medications on file as of 06/25/2020.   Reviewed chart for medication changes and drug therapy problems ahead of medication adherence call.  Attempted to contact patient x 3 for medication review and health check, unable to reach patient, left voicemails to return call.   Star Rating Drugs: Atorvastatin - last fill 05/14/20  [place], RMA Clinical Pharmacists Assistant [phone]  Time Spent: [phone]

## 2020-07-09 ENCOUNTER — Ambulatory Visit (INDEPENDENT_AMBULATORY_CARE_PROVIDER_SITE_OTHER): Payer: Medicare HMO | Admitting: Psychology

## 2020-07-09 DIAGNOSIS — F3289 Other specified depressive episodes: Secondary | ICD-10-CM

## 2020-07-23 ENCOUNTER — Other Ambulatory Visit: Payer: Self-pay | Admitting: Internal Medicine

## 2020-08-05 ENCOUNTER — Ambulatory Visit: Payer: Medicare HMO | Admitting: Psychology

## 2020-08-14 ENCOUNTER — Telehealth: Payer: Self-pay | Admitting: Pharmacist

## 2020-08-14 NOTE — Progress Notes (Signed)
      Chronic Care Management Pharmacy Assistant   Name: Anna Lyons  MRN: [id number] DOB: 09-24-1955  Reason for Encounter: Disease State - General Adherence   Recent office visits:  None noted.  Recent consult visits:  None noted.  Hospital visits:  None in previous 6 months  Medications: Outpatient Encounter Medications as of 08/14/2020  Medication Sig   albuterol (PROVENTIL HFA;VENTOLIN HFA) 108 (90 Base) MCG/ACT inhaler Inhale 2 puffs into the lungs every 6 (six) hours as needed for wheezing or shortness of breath.   ARIPiprazole (ABILIFY) 2 MG tablet TAKE 1 TABLET DAILY.   aspirin EC 81 MG tablet Take 81 mg by mouth daily.   atorvastatin (LIPITOR) 80 MG tablet TAKE 1 TABLET (80 MG TOTAL) BY MOUTH AT BEDTIME.   cholecalciferol (VITAMIN D3) 25 MCG (1000 UNIT) tablet Take 1,000 Units by mouth daily.   DULoxetine (CYMBALTA) 60 MG capsule TAKE ONE CAPSULE BY MOUTH DAILY   ferrous sulfate 324 MG TBEC Take 324 mg by mouth.   gabapentin (NEURONTIN) 300 MG capsule TAKE 1 CAPSULE (300 MG TOTAL) BY MOUTH AT BEDTIME.   levETIRAcetam (KEPPRA) 500 MG tablet TAKE 1 TABLET TWICE DAILY   Magnesium 250 MG TABS Take by mouth.   Multiple Vitamin (MULTIVITAMIN WITH MINERALS) TABS tablet Take 1 tablet by mouth daily.   pramipexole (MIRAPEX) 0.5 MG tablet TAKE 1 TABLET (0.5 MG TOTAL) BY MOUTH AT BEDTIME AS NEEDED.   temazepam (RESTORIL) 15 MG capsule TAKE 1-2 AT BEDTIME AS NEEDED FOR SLEEP   vitamin B-12 (CYANOCOBALAMIN) 1000 MCG tablet Take 1,000 mcg by mouth daily.   No facility-administered encounter medications on file as of 08/14/2020.    Have you had any problems recently with your health? Patient has not sleeping well at night, because she is out of her Temazepam.   Have you had any problems with your pharmacy? Patient stated the pharmacy would not refill her medication for Temazepam.  What issues or side effects are you having with your  medications? Patient denies any side  effects or issues currently.   What would you like me to pass along to [facility], [facility] for them to help you with?  Patient had no concerns at this time  What can we do to take care of you better? Patient states if an appointment is needed to please have office notify her.   Star Rating Drugs:  Atorvastatin 80 mg  - last filled  05/14/20 90 days   Orinda Kenner, [place] Clinical Pharmacists Assistant [phone]  Time Spent:30

## 2020-09-02 ENCOUNTER — Telehealth: Payer: Self-pay | Admitting: Pharmacist

## 2020-09-02 NOTE — Progress Notes (Addendum)
    Chronic Care Management Pharmacy Assistant   Name: Anna Lyons  MRN: [id number] DOB: 16-May-1955  Pharmacist Review  Made a call to [name] this morning to ask if she was still taking the Temazepam and if so how often. Patient states that she takes the medication to help her sleep so she takes it daily at bedtime. She states that she did talk with someone from [facility] that said they would call out to Dr. Sharlet Salina office for a new prescription but she has not heard anything since. She is not sure when the call was made just knows it has been a while. Patient states that she would like for new rx to be sent to [facility] on [address].   Ethelene Hal Clinical Pharmacist Assistant [phone]   Time spent:10

## 2020-09-04 NOTE — Telephone Encounter (Signed)
She would need visit as she is overdue for follow up her MDD last visit 02/2020.

## 2020-09-08 ENCOUNTER — Telehealth: Payer: Self-pay | Admitting: Pharmacist

## 2020-09-08 NOTE — Progress Notes (Signed)
Called patient and left voice message in reference to refill requests for Temazepam.   Anna Lyons, [place] Clinical Pharmacists Assistant [phone]  Time Spent: 6

## 2020-09-11 NOTE — Progress Notes (Signed)
Called patient and left a detailed voice message about an appointment was needed to refill Temazepam.  Anna Lyons, [place] Clinical Pharmacists Assistant [phone]  Time Spent: 6

## 2020-09-24 ENCOUNTER — Telehealth: Payer: Medicare HMO

## 2020-09-25 ENCOUNTER — Telehealth: Payer: Self-pay | Admitting: Pharmacist

## 2020-09-25 NOTE — Progress Notes (Signed)
    Chronic Care Management Pharmacy Assistant   Name: Anna Lyons  MRN: [id number] DOB: 02/08/1955  Reason for Encounter: Disease State-General Adherence  Recent office visits:  02/15/20 Anna Lyons (PCP) - Depression. Start Duloxetine 60 mg. D/c Baclofen 20 mg.  Recent consult visits:  04/29/20 Anna Lyons (Optometry) - [place] of Anna Lyons, bilateral.  Hospital visits:  None in previous 6 months  Medications: Outpatient Encounter Medications as of 09/25/2020  Medication Sig   albuterol (PROVENTIL HFA;VENTOLIN HFA) 108 (90 Base) MCG/ACT inhaler Inhale 2 puffs into the lungs every 6 (six) hours as needed for wheezing or shortness of breath.   ARIPiprazole (ABILIFY) 2 MG tablet TAKE 1 TABLET DAILY.   aspirin EC 81 MG tablet Take 81 mg by mouth daily.   atorvastatin (LIPITOR) 80 MG tablet TAKE 1 TABLET (80 MG TOTAL) BY MOUTH AT BEDTIME.   cholecalciferol (VITAMIN D3) 25 MCG (1000 UNIT) tablet Take 1,000 Units by mouth daily.   DULoxetine (CYMBALTA) 60 MG capsule TAKE ONE CAPSULE BY MOUTH DAILY   ferrous sulfate 324 MG TBEC Take 324 mg by mouth.   gabapentin (NEURONTIN) 300 MG capsule TAKE 1 CAPSULE (300 MG TOTAL) BY MOUTH AT BEDTIME.   levETIRAcetam (KEPPRA) 500 MG tablet TAKE 1 TABLET TWICE DAILY   Magnesium 250 MG TABS Take by mouth.   Multiple Vitamin (MULTIVITAMIN WITH MINERALS) TABS tablet Take 1 tablet by mouth daily.   pramipexole (MIRAPEX) 0.5 MG tablet TAKE 1 TABLET (0.5 MG TOTAL) BY MOUTH AT BEDTIME AS NEEDED.   temazepam (RESTORIL) 15 MG capsule TAKE 1-2 AT BEDTIME AS NEEDED FOR SLEEP   vitamin B-12 (CYANOCOBALAMIN) 1000 MCG tablet Take 1,000 mcg by mouth daily.   No facility-administered encounter medications on file as of 09/25/2020.    Contacted Anna Lyons on 09/25/20 for general disease state and medication adherence call.   Patient is not > 5 days past due for refill on the following medications per chart history:  Star Medications: Medication Name/mg Last Fill Days  Supply Atorvastatin    07/29/20 90   What concerns do you have about your medications? Patient states she is thinking about switching pharmacies because the mail order takes a little longer and she goes 7-10 days without medications waiting on it to arrive. Patient is interested in learning morning about [facility] and delivery.  The patient denies side effects with her medications.   How often do you forget or accidentally miss a dose? Rarely  Do you use a pillbox? Yes  Are you having any problems getting your medications from your pharmacy? Yes  Has the cost of your medications been a concern? No  If yes, what medication and is patient assistance available or has it been applied for?  Since last visit with CPP, no interventions have been made:   The patient has not had an ED visit since last contact.   The patient reports the following problems with their health.   she reports the following  concerns or questions for Anna Lyons, Pharm. D at this time.   Counseled patient on:  Benefits of adherence packaging or a pillbox.   Care Gaps: Last annual wellness visit: 02/08/20 If Diabetic: Last eye exam / retinopathy screening: Last diabetic foot exam:  Patient would like to schedule an appointment with Dr. Sharlet Lyons anytime after Sept. 1st in the morning.   Anna Lyons, Anna Lyons Clinical Pharmacists Assistant [phone]  Time Spent: [phone]

## 2020-10-03 ENCOUNTER — Encounter: Payer: Self-pay | Admitting: Internal Medicine

## 2020-10-03 ENCOUNTER — Ambulatory Visit (INDEPENDENT_AMBULATORY_CARE_PROVIDER_SITE_OTHER): Payer: Medicare HMO | Admitting: Internal Medicine

## 2020-10-03 ENCOUNTER — Other Ambulatory Visit: Payer: Self-pay

## 2020-10-03 DIAGNOSIS — G4700 Insomnia, unspecified: Secondary | ICD-10-CM

## 2020-10-03 DIAGNOSIS — G2581 Restless legs syndrome: Secondary | ICD-10-CM

## 2020-10-03 DIAGNOSIS — F331 Major depressive disorder, recurrent, moderate: Secondary | ICD-10-CM

## 2020-10-03 DIAGNOSIS — F333 Major depressive disorder, recurrent, severe with psychotic symptoms: Secondary | ICD-10-CM

## 2020-10-03 MED ORDER — DULOXETINE HCL 60 MG PO CPEP: 60.0000 mg | ORAL_CAPSULE | Freq: Every day | ORAL | 3 refills | Status: DC

## 2020-10-03 MED ORDER — ATORVASTATIN CALCIUM 80 MG PO TABS: 80.0000 mg | ORAL_TABLET | Freq: Every day | ORAL | 3 refills | Status: DC

## 2020-10-03 MED ORDER — ARIPIPRAZOLE 2 MG PO TABS: 2.0000 mg | ORAL_TABLET | Freq: Every day | ORAL | 3 refills | Status: DC

## 2020-10-03 MED ORDER — GABAPENTIN 300 MG PO CAPS: 300.0000 mg | ORAL_CAPSULE | Freq: Every day | ORAL | 3 refills | Status: DC

## 2020-10-03 MED ORDER — TEMAZEPAM 15 MG PO CAPS: 15.0000 mg | ORAL_CAPSULE | Freq: Every day | ORAL | 1 refills | Status: DC

## 2020-10-03 MED ORDER — PRAMIPEXOLE DIHYDROCHLORIDE 0.5 MG PO TABS: 0.5000 mg | ORAL_TABLET | Freq: Every evening | ORAL | 3 refills | Status: DC | PRN

## 2020-10-03 MED ORDER — LEVETIRACETAM 500 MG PO TABS: 500.0000 mg | ORAL_TABLET | Freq: Two times a day (BID) | ORAL | 3 refills | Status: DC

## 2020-10-03 NOTE — Assessment & Plan Note (Signed)
Refilled gabapentin to take in the evening.

## 2020-10-03 NOTE — Assessment & Plan Note (Signed)
Rx temazepam 15 mg qhs (prior on 15-30 mg but out for several months and is likely naive at this time) for sleep.

## 2020-10-03 NOTE — Patient Instructions (Signed)
We have refilled the temazepam and the gabapentin to the [employer] and all the other medications.

## 2020-10-03 NOTE — Assessment & Plan Note (Signed)
Taking cymbalta 60 mg daily and abilify 2 mg daily and refilled both. Suspect insomnia has caused some worsening of this. She is seeing counseling. 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 Referral to psych done today.

## 2020-10-03 NOTE — Progress Notes (Signed)
   Subjective:   Patient ID: Anna Lyons, female    DOB: Aug 31, 1955, 65 y.o.   MRN: [id number]  Medication Refill Pertinent negatives include no abdominal pain, chest pain, coughing, nausea or vomiting.  The patient is a 65 YO female coming in for concerns about sleep and depression. Daughter is present and helps with history at times. She is doing okay with mood. Out of sleeping medication for some time. Sleeping poorly and maybe an hour at a time.   Review of Systems  Constitutional: Negative.   HENT: Negative.    Eyes: Negative.   Respiratory:  Negative for cough, chest tightness and shortness of breath.   Cardiovascular:  Negative for chest pain, palpitations and leg swelling.  Gastrointestinal:  Negative for abdominal distention, abdominal pain, constipation, diarrhea, nausea and vomiting.  Musculoskeletal: Negative.   Skin: Negative.   Neurological: Negative.   Psychiatric/Behavioral:  Positive for sleep disturbance.    Objective:  Physical Exam Constitutional:      Appearance: She is well-developed.  HENT:     Head: Normocephalic and atraumatic.  Cardiovascular:     Rate and Rhythm: Normal rate and regular rhythm.  Pulmonary:     Effort: Pulmonary effort is normal. No respiratory distress.     Breath sounds: Normal breath sounds. No wheezing or rales.  Abdominal:     General: Bowel sounds are normal. There is no distension.     Palpations: Abdomen is soft.     Tenderness: There is no abdominal tenderness. There is no rebound.  Musculoskeletal:     Cervical back: Normal range of motion.  Skin:    General: Skin is warm and dry.  Neurological:     Mental Status: She is alert and oriented to person, place, and time.     Coordination: Coordination normal.  Psychiatric:     Comments: Flat affect    Vitals:   10/03/20 1059  BP: 122/78  Pulse: 78  Resp: 18  SpO2: 95%  Weight: 125 lb (56.7 kg)  Height: '5\' 6"'$  (1.676 m)    This visit occurred during the SARS-CoV-2  public health emergency.  Safety protocols were in place, including screening questions prior to the visit, additional usage of staff PPE, and extensive cleaning of exam room while observing appropriate contact time as indicated for disinfecting solutions.   Assessment & Plan:

## 2021-02-19 ENCOUNTER — Telehealth: Payer: Self-pay

## 2021-02-19 NOTE — Progress Notes (Signed)
° ° °  Chronic Care Management Pharmacy Assistant   Name: Anna Lyons  MRN: [id number] DOB: 10-06-1955   Reason for Encounter: Disease State-General Call    Recent office visits:  10/03/20 Hoyt Koch, MD-PCP (major depressive disorder) referral to Psychiatry  Recent consult visits:  None ID  Hospital visits:  None in previous 6 months  Medications: Outpatient Encounter Medications as of 02/19/2021  Medication Sig   albuterol (PROVENTIL HFA;VENTOLIN HFA) 108 (90 Base) MCG/ACT inhaler Inhale 2 puffs into the lungs every 6 (six) hours as needed for wheezing or shortness of breath.   ARIPiprazole (ABILIFY) 2 MG tablet Take 1 tablet (2 mg total) by mouth daily.   aspirin EC 81 MG tablet Take 81 mg by mouth daily.   atorvastatin (LIPITOR) 80 MG tablet Take 1 tablet (80 mg total) by mouth at bedtime.   cholecalciferol (VITAMIN D3) 25 MCG (1000 UNIT) tablet Take 1,000 Units by mouth daily.   DULoxetine (CYMBALTA) 60 MG capsule Take 1 capsule (60 mg total) by mouth daily.   ferrous sulfate 324 MG TBEC Take 324 mg by mouth.   gabapentin (NEURONTIN) 300 MG capsule Take 1 capsule (300 mg total) by mouth at bedtime.   levETIRAcetam (KEPPRA) 500 MG tablet Take 1 tablet (500 mg total) by mouth 2 (two) times daily.   Magnesium 250 MG TABS Take by mouth.   Multiple Vitamin (MULTIVITAMIN WITH MINERALS) TABS tablet Take 1 tablet by mouth daily.   pramipexole (MIRAPEX) 0.5 MG tablet Take 1 tablet (0.5 mg total) by mouth at bedtime as needed.   temazepam (RESTORIL) 15 MG capsule Take 1 capsule (15 mg total) by mouth at bedtime.   vitamin B-12 (CYANOCOBALAMIN) 1000 MCG tablet Take 1,000 mcg by mouth daily.   No facility-administered encounter medications on file as of 02/19/2021.   Have you had any problems recently with your health?Patient states that she is doing well and does not have any new health issues at this time  Have you had any problems with your pharmacy?Patient states that she  does not have any problems with getting medications or the cost of medications from the pharmacy  What issues or side effects are you having with your medications?Patient states that she does not have any side effects from medications  What would you like me to pass along to [facility] for them to help you with?Patient states that she is doing well, no concerns about health or medications   What can we do to take care of you better?Patient states she does not need anything at this time   Care Gaps: Colonoscopy-12/18/15 Diabetic Foot Exam-NA Mammogram-NA Ophthalmology-NA Dexa Scan - NA Annual Well Visit - NA Micro albumin-NA Hemoglobin A1c- NA  Star Rating Drugs: Atorvastatin 80 mg-last fill 12/29/20 90 ds  Ethelene Hal Clinical Pharmacist Assistant [phone]

## 2021-02-20 ENCOUNTER — Ambulatory Visit (INDEPENDENT_AMBULATORY_CARE_PROVIDER_SITE_OTHER): Payer: Medicare HMO

## 2021-02-20 ENCOUNTER — Other Ambulatory Visit: Payer: Self-pay

## 2021-02-20 DIAGNOSIS — Z0000 Encounter for general adult medical examination without abnormal findings: Secondary | ICD-10-CM

## 2021-02-20 NOTE — Progress Notes (Signed)
Subjective:   Anna Lyons is a 66 y.o. female who presents for Medicare Annual (Subsequent) preventive examination.  Review of Systems     Cardiac Risk Factors include: advanced age (>22men, >26 women);dyslipidemia;hypertension     Objective:    There were no vitals filed for this visit. There is no height or weight on file to calculate BMI.  Advanced Directives 02/20/2021 02/08/2020 11/30/2018 10/12/2018 10/11/2018 07/20/2018 02/14/2018  Does Patient Have a Medical Advance Directive? Yes No No Yes No No No  Type of Advance Directive Living will;Healthcare Power of [place] - - -  Does patient want to make changes to medical advance directive? No - Patient declined - - No - Patient declined - - -  Copy of [place] in Chart? No - copy requested - - No - copy requested - - -  Would patient like information on creating a medical advance directive? - No - Patient declined No - Patient declined No - Patient declined No - Patient declined No - Patient declined No - Patient declined  Pre-existing out of facility DNR order (yellow form or pink MOST form) - - - - - - -    Current Medications (verified) Outpatient Encounter Medications as of 02/20/2021  Medication Sig   albuterol (PROVENTIL HFA;VENTOLIN HFA) 108 (90 Base) MCG/ACT inhaler Inhale 2 puffs into the lungs every 6 (six) hours as needed for wheezing or shortness of breath.   ARIPiprazole (ABILIFY) 2 MG tablet Take 1 tablet (2 mg total) by mouth daily.   aspirin EC 81 MG tablet Take 81 mg by mouth daily.   atorvastatin (LIPITOR) 80 MG tablet Take 1 tablet (80 mg total) by mouth at bedtime.   cholecalciferol (VITAMIN D3) 25 MCG (1000 UNIT) tablet Take 1,000 Units by mouth daily.   DULoxetine (CYMBALTA) 60 MG capsule Take 1 capsule (60 mg total) by mouth daily.   ferrous sulfate 324 MG TBEC Take 324 mg by mouth.   gabapentin (NEURONTIN) 300 MG capsule Take 1 capsule (300 mg total) by mouth  at bedtime.   levETIRAcetam (KEPPRA) 500 MG tablet Take 1 tablet (500 mg total) by mouth 2 (two) times daily.   Magnesium 250 MG TABS Take by mouth.   Multiple Vitamin (MULTIVITAMIN WITH MINERALS) TABS tablet Take 1 tablet by mouth daily.   pramipexole (MIRAPEX) 0.5 MG tablet Take 1 tablet (0.5 mg total) by mouth at bedtime as needed.   temazepam (RESTORIL) 15 MG capsule Take 1 capsule (15 mg total) by mouth at bedtime.   vitamin B-12 (CYANOCOBALAMIN) 1000 MCG tablet Take 1,000 mcg by mouth daily.   No facility-administered encounter medications on file as of 02/20/2021.    Allergies (verified) Codeine and Oxycodone-acetaminophen   History: Past Medical History:  Diagnosis Date   Angina    Breast cyst    Cancer ([place])    Depression    Hypertension    RLS (restless legs syndrome) 11/16/2011   Seizures ([place]) 2016   last seizure="last year, 2016" per pt.   Stroke ([place]) 04/20/2011   a. 04/20/11 Left subcortical infarct treated w/ TPA;  b. 04/2011 Echo: EF 55-60%;  c. 04/2011 Normal Carotid u/s  d. Residual "walk w/limp on right; unable to grasp w/right hand"   TIA (transient ischemic attack) 08/2010   Tobacco abuse    a. quit @ time of CVA 04/2011.   Past Surgical History:  Procedure Laterality Date   BREAST RECONSTRUCTION  bilaterally   CARPAL TUNNEL RELEASE Left 1990's   CARPAL TUNNEL RELEASE Left 12/08/2015   CESAREAN SECTION  1979   KNEE ARTHROSCOPY Left 1990's    cartilage repair   LEFT HEART CATHETERIZATION WITH CORONARY ANGIOGRAM N/A 05/20/2011   Procedure: LEFT HEART CATHETERIZATION WITH CORONARY ANGIOGRAM;  Surgeon: Josue Hector, MD;  Location: [facility] CATH LAB;  Service: Cardiovascular;  Laterality: N/A;   MASTECTOMY Bilateral 1980's   bilateral with reconstruction   Family History  Problem Relation Age of Onset   Lupus Mother        died early 81's.   Emphysema Father        died late 81's.   COPD Father    Pancreatic cancer Brother    Bladder Cancer Brother     Rectal cancer Brother    Suicidality Brother    Colon cancer Neg Hx    Social History   Socioeconomic History   Marital status: Widowed    Spouse name: Not on file   Number of children: 1   Years of education: 50   Highest education level: Not on file  Occupational History   Occupation: disabled  Tobacco Use   Smoking status: Every Day    Packs/day: 0.75    Years: 45.00    Pack years: 33.75    Types: Cigarettes   Smokeless tobacco: Never   Tobacco comments:    Currently smoking 1/2 pack per day  Vaping Use   Vaping Use: Never used  Substance and Sexual Activity   Alcohol use: Yes    Alcohol/week: 7.0 standard drinks    Types: 7 Glasses of wine per week    Comment: very occasional   Drug use: No   Sexual activity: Not Currently  Other Topics Concern   Not on file  Social History Narrative   Pt lives alone.   Caffeine Use: 1-3 cups of caffeine daily (tea)   Social Determinants of Health   Financial Resource Strain: Low Risk    Difficulty of Paying Living Expenses: Not hard at all  Food Insecurity: No Food Insecurity   Worried About Charity fundraiser in the Last Year: Never true   Ran Out of Food in the Last Year: Never true  Transportation Needs: No Transportation Needs   Lack of Transportation (Medical): No   Lack of Transportation (Non-Medical): No  Physical Activity: Inactive   Days of Exercise per Week: 0 days   Minutes of Exercise per Session: 0 min  Stress: No Stress Concern Present   Feeling of Stress : Not at all  Social Connections: Moderately Isolated   Frequency of Communication with Friends and Family: Never   Frequency of Social Gatherings with Friends and Family: Never   Attends Religious Services: Never   Marine scientist or Organizations: Yes   Attends Music therapist: Never   Marital Status: Married    Tobacco Counseling Ready to quit: Not Answered Counseling given: Not Answered Tobacco comments: Currently smoking  1/2 pack per day   Clinical Intake:  Pre-visit preparation completed: Yes  Pain : No/denies pain     Nutritional Risks: None Diabetes: No  How often do you need to have someone help you when you read instructions, pamphlets, or other written materials from your doctor or pharmacy?: 1 - Never What is the last grade level you completed in school?: High School Graduate  Diabetic? no  Interpreter Needed?: No  Information entered by :: Lisette Abu, LPN  Activities of Daily Living In your present state of health, do you have any difficulty performing the following activities: 02/20/2021  Hearing? N  Vision? N  Difficulty concentrating or making decisions? Y  Dressing or bathing? N  Doing errands, shopping? N  Preparing Food and eating ? N  Using the Toilet? N  In the past six months, have you accidently leaked urine? N  Do you have problems with loss of bowel control? N  Managing your Medications? N  Managing your Finances? N  Housekeeping or managing your Housekeeping? N  Some recent data might be hidden    Patient Care Team: Hoyt Koch, MD as PCP - General (Internal Medicine) Letta Pate, Luanna Salk, MD as Consulting Physician (Physical Medicine and Rehabilitation) Charlton Haws, [facility] as Pharmacist (Pharmacist)  Indicate any recent Medical Services you may have received from other than [facility] providers in the past year (date may be approximate).     Assessment:   This is a routine wellness examination for [place].  Hearing/Vision screen Hearing Screening - Comments:: Patient denied any hearing difficulty.   No hearing aids.  Vision Screening - Comments:: Patient wears corrective glasses/contacts.  Eye exam done annually by: Dr. Francis Dowse  Dietary issues and exercise activities discussed: Current Exercise Habits: The patient does not participate in regular exercise at present, Exercise limited by: psychological condition(s);cardiac  condition(s);neurologic condition(s)   Goals Addressed               This Visit's Progress     Patient Stated (pt-stated)        Patient declined health goal at this time.      Depression Screen PHQ 2/9 Scores 02/20/2021 10/03/2020 02/15/2020 02/08/2020 01/12/2019 07/21/2018 12/02/2016  PHQ - 2 Score 6 6 3 6 3 1 6   PHQ- 9 Score 15 17 - 20 19 10 24   Exception Documentation - - - - - - -    Fall Risk Fall Risk  02/20/2021 02/15/2020 02/08/2020 05/16/2019 10/24/2018  Falls in the past year? 1 1 0 0 0  Number falls in past yr: 0 1 0 0 -  Injury with Fall? 0 0 0 0 -  Comment - - - - -  Risk for fall due to : No Fall Risks - No Fall Risks;Other (Comment) - -  Risk for fall due to: Comment - - - - -  Follow up Falls evaluation completed - Falls evaluation completed;Education provided - -    FALL RISK PREVENTION PERTAINING TO THE HOME:  Any stairs in or around the home? No  If so, are there any without handrails? No  Home free of loose throw rugs in walkways, pet beds, electrical cords, etc? Yes  Adequate lighting in your home to reduce risk of falls? Yes   ASSISTIVE DEVICES UTILIZED TO PREVENT FALLS:  Life alert? No  Use of a cane, walker or w/c? No  Grab bars in the bathroom? Yes  Shower chair or bench in shower? Yes  Elevated toilet seat or a handicapped toilet? Yes   TIMED UP AND GO:  Was the test performed? No .  Length of time to ambulate 10 feet: na sec.   Gait steady and fast without use of assistive device  Cognitive Function: Normal cognitive status assessed by direct observation by this Nurse Health Advisor. No abnormalities found.   MMSE - Mini Mental State Exam 09/01/2016 05/24/2014  Orientation to time 5 5  Orientation to Place 5 5  Registration 3 3  Attention/ Calculation 5 5  Recall 3 3  Language- name 2 objects 2 2  Language- repeat 1 1  Language- follow 3 step command 3 3  Language- read & follow direction 1 1  Write a sentence 1 1  Copy design 1 1  Total  score 30 30        Immunizations Immunization History  Administered Date(s) Administered   Influenza Split 11/17/2011   Influenza Whole 11/01/2013   Influenza,inj,Quad PF,6+ Mos 09/23/2015, 10/28/2016, 10/17/2017, 09/27/2018, 10/11/2019   Influenza-Unspecified 11/12/2014   PFIZER(Purple Top)SARS-COV-2 Vaccination 03/10/2019, 04/02/2019   PPD Test 05/15/2014   Pneumococcal Conjugate-13 10/11/2019   Pneumococcal Polysaccharide-23 11/17/2011   Tdap 12/20/2013   Zoster, Live 12/13/2013    TDAP status: Up to date  Flu Vaccine status: Due, Education has been provided regarding the importance of this vaccine. Advised may receive this vaccine at local pharmacy or Health Dept. Aware to provide a copy of the vaccination record if obtained from local pharmacy or Health Dept. Verbalized acceptance and understanding.  Pneumococcal vaccine status: Up to date  Covid-19 vaccine status: Completed vaccines  Qualifies for Shingles Vaccine? Yes   Zostavax completed Yes   Shingrix Completed?: No.    Education has been provided regarding the importance of this vaccine. Patient has been advised to call insurance company to determine out of pocket expense if they have not yet received this vaccine. Advised may also receive vaccine at local pharmacy or Health Dept. Verbalized acceptance and understanding.  Screening Tests Health Maintenance  Topic Date Due   Zoster Vaccines- Shingrix (1 of 2) Never done   COVID-19 Vaccine (3 - Booster for Pfizer series) 05/28/2019   Pneumonia Vaccine 30+ Years old (3 - PPSV23 if available, else PCV20) 10/10/2020   INFLUENZA VACCINE  05/01/2021 (Originally 09/01/2020)   TETANUS/TDAP  12/21/2023   COLONOSCOPY (Pts 45-35yrs Insurance coverage will need to be confirmed)  12/17/2025   DEXA SCAN  Completed   Hepatitis C Screening  Completed   HPV VACCINES  Aged Out    Health Maintenance  Health Maintenance Due  Topic Date Due   Zoster Vaccines- Shingrix (1 of 2)  Never done   COVID-19 Vaccine (3 - Booster for Pfizer series) 05/28/2019   Pneumonia Vaccine 7+ Years old (3 - PPSV23 if available, else PCV20) 10/10/2020    Colorectal cancer screening: Type of screening: Colonoscopy. Completed 12/18/2015. Repeat every 10 years  Mammogram status: No longer required due to BREAST CANCER.  Bone Density status: Completed 02/02/2018. Results reflect: Bone density results: OSTEOPENIA. Repeat every 2 years.  Lung Cancer Screening: (Low Dose CT Chest recommended if Age 70-80 years, 30 pack-year currently smoking OR have quit w/in 15years.) does qualify.   Lung Cancer Screening Referral: NO  Additional Screening:  Hepatitis C Screening: does not qualify; Completed NO  Vision Screening: Recommended annual ophthalmology exams for early detection of glaucoma and other disorders of the eye. Is the patient up to date with their annual eye exam?  Yes  Who is the provider or what is the name of the office in which the patient attends annual eye exams? Dr. Francis Dowse If pt is not established with a provider, would they like to be referred to a provider to establish care? No .   Dental Screening: Recommended annual dental exams for proper oral hygiene  Community Resource Referral / Chronic Care Management: CRR required this visit?  No   CCM required this visit?  No      Plan:  I have personally reviewed and noted the following in the patients chart:   Medical and social history Use of alcohol, tobacco or illicit drugs  Current medications and supplements including opioid prescriptions.  Functional ability and status Nutritional status Physical activity Advanced directives List of other physicians Hospitalizations, surgeries, and ER visits in previous 12 months Vitals Screenings to include cognitive, depression, and falls Referrals and appointments  In addition, I have reviewed and discussed with patient certain preventive protocols, quality  metrics, and best practice recommendations. A written personalized care plan for preventive services as well as general preventive health recommendations were provided to patient.     Sheral Flow, LPN   7/41/4239   Nurse Notes:  Patient is cogitatively intact. There were no vitals filed for this visit. There is no height or weight on file to calculate BMI.

## 2021-03-31 ENCOUNTER — Encounter: Payer: Self-pay | Admitting: Gastroenterology

## 2021-04-08 ENCOUNTER — Other Ambulatory Visit: Payer: Self-pay | Admitting: Internal Medicine

## 2021-04-30 ENCOUNTER — Telehealth: Payer: Self-pay | Admitting: Internal Medicine

## 2021-04-30 ENCOUNTER — Encounter: Payer: Self-pay | Admitting: Internal Medicine

## 2021-04-30 ENCOUNTER — Ambulatory Visit (INDEPENDENT_AMBULATORY_CARE_PROVIDER_SITE_OTHER): Payer: Medicare HMO | Admitting: Internal Medicine

## 2021-04-30 VITALS — BP 118/82 | HR 75 | Resp 18 | Ht 66.0 in | Wt 131.2 lb

## 2021-04-30 DIAGNOSIS — F333 Major depressive disorder, recurrent, severe with psychotic symptoms: Secondary | ICD-10-CM | POA: Diagnosis not present

## 2021-04-30 DIAGNOSIS — G2581 Restless legs syndrome: Secondary | ICD-10-CM | POA: Diagnosis not present

## 2021-04-30 DIAGNOSIS — E538 Deficiency of other specified B group vitamins: Secondary | ICD-10-CM

## 2021-04-30 DIAGNOSIS — R531 Weakness: Secondary | ICD-10-CM | POA: Diagnosis not present

## 2021-04-30 DIAGNOSIS — R29818 Other symptoms and signs involving the nervous system: Secondary | ICD-10-CM | POA: Insufficient documentation

## 2021-04-30 DIAGNOSIS — R0789 Other chest pain: Secondary | ICD-10-CM | POA: Diagnosis not present

## 2021-04-30 LAB — COMPREHENSIVE METABOLIC PANEL
ALT: 16 U/L (ref 0–35)
AST: 23 U/L (ref 0–37)
Albumin: 4.3 g/dL (ref 3.5–5.2)
Alkaline Phosphatase: 76 U/L (ref 39–117)
BUN: 9 mg/dL (ref 6–23)
CO2: 32 mEq/L (ref 19–32)
Calcium: 9.7 mg/dL (ref 8.4–10.5)
Chloride: 99 mEq/L (ref 96–112)
Creatinine, Ser: 0.61 mg/dL (ref 0.40–1.20)
GFR: 92.63 mL/min (ref >60.00)
Glucose, Bld: 85 mg/dL (ref 70–99)
Potassium: 3.8 mEq/L (ref 3.5–5.1)
Sodium: 138 mEq/L (ref 135–145)
Total Bilirubin: 0.5 mg/dL (ref 0.2–1.2)
Total Protein: 7.5 g/dL (ref 6.0–8.3)

## 2021-04-30 LAB — CBC
HCT: 38.5 % (ref 36.0–46.0)
Hemoglobin: 13.1 g/dL (ref 12.0–15.0)
MCHC: 34.1 g/dL (ref 30.0–36.0)
MCV: 94.4 fl (ref 78.0–100.0)
Platelets: 204 10*3/uL (ref 150.0–400.0)
RBC: 4.07 Mil/uL (ref 3.87–5.11)
RDW: 12.6 % (ref 11.5–15.5)
WBC: 3.8 10*3/uL — ABNORMAL LOW (ref 4.0–10.5)

## 2021-04-30 LAB — TSH: TSH: 3.32 u[IU]/mL (ref 0.35–5.50)

## 2021-04-30 LAB — TROPONIN I (HIGH SENSITIVITY): High Sens Troponin I: 6 ng/L (ref 2–17)

## 2021-04-30 LAB — VITAMIN D 25 HYDROXY (VIT D DEFICIENCY, FRACTURES): VITD: 90.44 ng/mL (ref 30.00–100.00)

## 2021-04-30 LAB — VITAMIN B12: Vitamin B-12: 1170 pg/mL — ABNORMAL HIGH (ref 211–911)

## 2021-04-30 LAB — BRAIN NATRIURETIC PEPTIDE: Pro B Natriuretic peptide (BNP): 27 pg/mL (ref 0.0–100.0)

## 2021-04-30 MED ORDER — PRAMIPEXOLE DIHYDROCHLORIDE 0.5 MG PO TABS: 0.5000 mg | ORAL_TABLET | Freq: Every evening | ORAL | 3 refills | Status: DC | PRN

## 2021-04-30 NOTE — Assessment & Plan Note (Signed)
Checking B12 today as no levels recently and new weakness/dizziness. Treat as appropriate she is taking B12 otc 1000 mcg daily.  ?

## 2021-04-30 NOTE — Assessment & Plan Note (Signed)
Concerning given she feels this is similar in nature to past NSTEMI. EKG done today without changes. Given this is ongoing 10 days it makes cardiac origin less likely. Checking troponin and BNP today to help rule out cardiac issues. Could be related to recent move and lifting and unpacking more.  ?

## 2021-04-30 NOTE — Assessment & Plan Note (Signed)
Overall depression is not improved. Has not seen psych since our last visit and is still taking abilify 2 mg daily and cymbalta 60 mg daily. Not worse but still moderate to severe. She is sleeping more often lately.  ?

## 2021-04-30 NOTE — Progress Notes (Signed)
? ?  Subjective:  ? ?Patient ID: Anna Lyons, female    DOB: 24-Jan-1956, 66 y.o.   MRN: [id number] ? ?HPI ?The patient is a 66 YO female coming in for new weakness, falls, dizziness. Past stroke. Worse in the last week but also some component gradually over years. Also chest pain last 10 days feels similar to prior NSTEMI. Weakness feels like past stroke also worse in the last 10 days. ? ?Review of Systems  ?Constitutional:  Positive for activity change, appetite change and fatigue. Negative for unexpected weight change.  ?HENT: Negative.    ?Eyes: Negative.   ?Respiratory:  Negative for cough, chest tightness and shortness of breath.   ?Cardiovascular:  Positive for chest pain. Negative for palpitations and leg swelling.  ?Gastrointestinal:  Negative for abdominal distention, abdominal pain, constipation, diarrhea, nausea and vomiting.  ?Musculoskeletal:  Positive for arthralgias.  ?Skin: Negative.   ?Neurological:  Positive for dizziness and weakness. Negative for seizures, light-headedness and headaches.  ?Psychiatric/Behavioral:  Positive for decreased concentration and dysphoric mood. Negative for sleep disturbance.   ? ?Objective:  ?Physical Exam ?Constitutional:   ?   Appearance: She is well-developed.  ?HENT:  ?   Head: Normocephalic and atraumatic.  ?Cardiovascular:  ?   Rate and Rhythm: Normal rate and regular rhythm.  ?Pulmonary:  ?   Effort: Pulmonary effort is normal. No respiratory distress.  ?   Breath sounds: Normal breath sounds. No wheezing or rales.  ?Abdominal:  ?   General: Bowel sounds are normal. There is no distension.  ?   Palpations: Abdomen is soft.  ?   Tenderness: There is no abdominal tenderness. There is no rebound.  ?Musculoskeletal:  ?   Cervical back: Normal range of motion.  ?Skin: ?   General: Skin is warm and dry.  ?Neurological:  ?   Mental Status: She is alert and oriented to person, place, and time.  ?   Motor: Weakness present.  ?   Coordination: Coordination abnormal.  ?    Comments: Slow gait and slow to stand, stable CN deficits from past stroke, right side weakness compared to left upper and lower extremities.   ?Psychiatric:  ?   Comments: Affect flat stable from prior  ? ? ?Vitals:  ? 04/30/21 0933  ?BP: 118/82  ?Pulse: 75  ?Resp: 18  ?SpO2: 98%  ?Weight: 131 lb 3.2 oz (59.5 kg)  ?Height: '5\' 6"'$  (1.676 m)  ? ?EKG: Rate 57, axis left, interval normal, sinus brady, no st or t wave changes, no significant change compared to prior 2020 ? ?This visit occurred during the SARS-CoV-2 public health emergency.  Safety protocols were in place, including screening questions prior to the visit, additional usage of staff PPE, and extensive cleaning of exam room while observing appropriate contact time as indicated for disinfecting solutions.  ? ?Assessment & Plan:  ? ?

## 2021-04-30 NOTE — Assessment & Plan Note (Signed)
Needs refill on mirapex 0.5 mg qhs which is done today and will keep dosage the same. ?

## 2021-04-30 NOTE — Assessment & Plan Note (Signed)
New/worsening dizziness and weakness concerning given past stroke and she states feels similar to past stroke. Could be posterior stroke with the dizziness. Going on 10 days so CT head adequate to rule out/in stroke. Ordered stat CT head. Checking B12, vitamin D, CBC, TSH to rule out other metabolic cause of weakness. ?

## 2021-04-30 NOTE — Assessment & Plan Note (Signed)
New/worsening dizziness and weakness concerning given past stroke and she states feels similar to past stroke. Going on 10 days so CT head adequate to rule out/in stroke. Ordered stat CT head. Checking B12, vitamin D, CBC, TSH to rule out other metabolic cause of weakness. ?

## 2021-04-30 NOTE — Telephone Encounter (Signed)
Pt states [facility] can not see her until 05-25-2021 ? ?Pt inquiring if provider can get her in any sooner ? ?Pt requesting a cb ?

## 2021-04-30 NOTE — Patient Instructions (Signed)
The EKG is normal and we are checking labs and head CT ?

## 2021-05-01 ENCOUNTER — Encounter: Payer: Self-pay | Admitting: Internal Medicine

## 2021-05-01 ENCOUNTER — Ambulatory Visit
Admission: RE | Admit: 2021-05-01 | Discharge: 2021-05-01 | Disposition: A | Discharge status: Home / Self Care | Payer: Medicare HMO | Source: Ambulatory Visit | Attending: Internal Medicine | Admitting: Internal Medicine

## 2021-05-01 DIAGNOSIS — R410 Disorientation, unspecified: Secondary | ICD-10-CM | POA: Diagnosis not present

## 2021-05-01 DIAGNOSIS — R29818 Other symptoms and signs involving the nervous system: Secondary | ICD-10-CM

## 2021-05-01 DIAGNOSIS — R42 Dizziness and giddiness: Secondary | ICD-10-CM | POA: Diagnosis not present

## 2021-05-01 DIAGNOSIS — R519 Headache, unspecified: Secondary | ICD-10-CM | POA: Diagnosis not present

## 2021-05-01 NOTE — Telephone Encounter (Signed)
I am unsure what this is in reference to? ?

## 2021-05-25 ENCOUNTER — Other Ambulatory Visit: Payer: Medicare HMO

## 2021-06-03 ENCOUNTER — Telehealth: Payer: Self-pay | Admitting: Internal Medicine

## 2021-06-03 ENCOUNTER — Telehealth: Payer: Self-pay

## 2021-06-03 DIAGNOSIS — Z1231 Encounter for screening mammogram for malignant neoplasm of breast: Secondary | ICD-10-CM

## 2021-06-03 NOTE — Progress Notes (Signed)
? ? ?Chronic Care Management ?Pharmacy Assistant  ? ?Name: Anna Lyons  MRN: [id number] DOB: April 29, 1955 ? ? ?Reason for Encounter: Disease State-Adherence  ?  ? ?Recent office visits:  ?04/30/21 Hoyt Koch, MD-PCP (Chest pain) EKG done today without changes, Checking troponin and BNP today to help rule out cardiac issues. No med changes ? ?Recent consult visits:  ?None since the last coordination call ? ?Hospital visits:  ?None since the last coordination call ? ?Medications: ?Outpatient Encounter Medications as of 06/03/2021  ?Medication Sig  ? albuterol (PROVENTIL HFA;VENTOLIN HFA) 108 (90 Base) MCG/ACT inhaler Inhale 2 puffs into the lungs every 6 (six) hours as needed for wheezing or shortness of breath.  ? ARIPiprazole (ABILIFY) 2 MG tablet Take 1 tablet (2 mg total) by mouth daily.  ? aspirin EC 81 MG tablet Take 81 mg by mouth daily.  ? atorvastatin (LIPITOR) 80 MG tablet Take 1 tablet (80 mg total) by mouth at bedtime.  ? cholecalciferol (VITAMIN D3) 25 MCG (1000 UNIT) tablet Take 1,000 Units by mouth daily.  ? DULoxetine (CYMBALTA) 60 MG capsule Take 1 capsule (60 mg total) by mouth daily.  ? ferrous sulfate 324 MG TBEC Take 324 mg by mouth.  ? gabapentin (NEURONTIN) 300 MG capsule Take 1 capsule (300 mg total) by mouth at bedtime.  ? levETIRAcetam (KEPPRA) 500 MG tablet Take 1 tablet (500 mg total) by mouth 2 (two) times daily.  ? Magnesium 250 MG TABS Take by mouth.  ? Multiple Vitamin (MULTIVITAMIN WITH MINERALS) TABS tablet Take 1 tablet by mouth daily.  ? pramipexole (MIRAPEX) 0.5 MG tablet Take 1 tablet (0.5 mg total) by mouth at bedtime as needed.  ? temazepam (RESTORIL) 15 MG capsule TAKE ONE CAPSULE BY MOUTH AT BEDTIME  ? vitamin B-12 (CYANOCOBALAMIN) 1000 MCG tablet Take 1,000 mcg by mouth daily.  ? ?No facility-administered encounter medications on file as of 06/03/2021.  ? ?[place] for General Review Call ? ? ?Chart Review: ? ?Have there been any documented new, changed, or  discontinued medications since last visit? No (If yes, include name, dose, frequency, date) ?Has there been any documented recent hospitalizations or ED visits since last visit with Clinical Pharmacist? No ?Brief Summary (including medication and/or Diagnosis changes): ? ? ?Adherence Review: ? ?Does the Clinical Pharmacist Assistant have access to adherence rates? Yes ?Adherence rates for STAR metric medications (List medication(s)/day supply/ last 2 fill dates). Down below ?Adherence rates for medications indicated for disease state being reviewed (List medication(s)/day supply/ last 2 fill dates).Down below ?Does the patient have >5 day gap between last estimated fill dates for any of the above medications or other medication gaps? No ?Reason for medication gaps. ? ? ?Disease State Questions: ? ?Able to connect with Patient? Yes ?Did patient have any problems with their health recently? Yes ?Note problems and Concerns:Patient states that she has been having some issues with her left breast. She states that there is an order for her to had an ultrasound set up. ?Have you had any admissions or emergency room visits or worsening of your condition(s) since last visit? No ?Details of ED visit, hospital visit and/or worsening condition(s): ?Have you had any visits with new specialists or providers since your last visit? No ?Explain: ?Have you had any new health care problem(s) since your last visit? No ?New problem(s) reported: ?Have you run out of any of your medications since you last spoke with clinical pharmacist? No ?What caused you to run out  of your medications? ?Are there any medications you are not taking as prescribed? No ?What kept you from taking your medications as prescribed? ?Are you having any issues or side effects with your medications? No ?Note of issues or side effects: ?Do you have any other health concerns or questions you want to discuss with your Clinical Pharmacist before your next visit?  No ?Note additional concerns and questions from Patient. ?Are there any health concerns that you feel we can do a better job addressing? No ?Note Patient's response. ?Are you having any problems with any of the following since the last visit: (select all that apply) ? None ? Details: ?12. Any falls since last visit? Yes ? Details:Patient states that she has fallen a few times and hurt her knee. She states that she does not use a cane or walker. Just stumbles sometimes when walking ?13. Any increased or uncontrolled pain since last visit? Yes ? Details: Left breast pin ?14. Next visit Type: telephone ?      Visit with:Clinical Pharmacist ?       Date:08/28/21 ?       Time:9:45 am ? ?15. Additional Details? No   ? ?Care Gaps: ?Colonoscopy-12/18/15 ?Diabetic Foot Exam-NA ?Mammogram-NA ?Ophthalmology-NA ?Dexa Scan - NA ?Annual Well Visit - NA ?Micro albumin-NA ?Hemoglobin A1c- 10/12/18 ?  ?Star Rating Drugs: ?Atorvastatin 80 mg-last fill 04/10/21 90 ds ?  ?Ethelene Hal ?Clinical Pharmacist Assistant ?[phone]  ?

## 2021-06-03 NOTE — Telephone Encounter (Signed)
Pt states her insurance agent advised her to have a mammogram ? ?Pt requesting referral for mammogram ? ?Pt last ov 04-30-2021 ?

## 2021-06-04 NOTE — Telephone Encounter (Signed)
Referral has been placed. 

## 2021-06-10 ENCOUNTER — Telehealth: Payer: Self-pay

## 2021-06-10 NOTE — Telephone Encounter (Signed)
Pt is calling to request an order of ultrasound of the breast instead of Mammogram due to having no breast tissue. ? ?Please advise ?

## 2021-06-11 NOTE — Telephone Encounter (Signed)
She has had bilateral mastectomies and is not indicated to have breast imaging unless problems, is she having problems? ?

## 2021-06-15 NOTE — Telephone Encounter (Signed)
Unable to get in contact with the pt. No option to lvm due to the phone constantly ringing.  ?

## 2021-08-24 ENCOUNTER — Other Ambulatory Visit: Payer: Self-pay | Admitting: Obstetrics and Gynecology

## 2021-08-24 DIAGNOSIS — Z1151 Encounter for screening for human papillomavirus (HPV): Secondary | ICD-10-CM | POA: Diagnosis not present

## 2021-08-24 DIAGNOSIS — Z6824 Body mass index (BMI) 24.0-24.9, adult: Secondary | ICD-10-CM | POA: Diagnosis not present

## 2021-08-24 DIAGNOSIS — N644 Mastodynia: Secondary | ICD-10-CM

## 2021-08-24 DIAGNOSIS — Z124 Encounter for screening for malignant neoplasm of cervix: Secondary | ICD-10-CM | POA: Diagnosis not present

## 2021-08-28 ENCOUNTER — Telehealth: Payer: Medicare HMO

## 2021-08-31 ENCOUNTER — Ambulatory Visit
Admission: RE | Admit: 2021-08-31 | Discharge: 2021-08-31 | Disposition: A | Discharge status: Home / Self Care | Payer: Medicare HMO | Source: Ambulatory Visit | Attending: Obstetrics and Gynecology | Admitting: Obstetrics and Gynecology

## 2021-08-31 DIAGNOSIS — R928 Other abnormal and inconclusive findings on diagnostic imaging of breast: Secondary | ICD-10-CM | POA: Diagnosis not present

## 2021-08-31 DIAGNOSIS — N644 Mastodynia: Secondary | ICD-10-CM | POA: Diagnosis not present

## 2021-10-16 ENCOUNTER — Other Ambulatory Visit: Payer: Self-pay | Admitting: Internal Medicine

## 2021-10-16 NOTE — Telephone Encounter (Signed)
Last OV 04/30/21  Please advise

## 2021-10-31 ENCOUNTER — Other Ambulatory Visit: Payer: Self-pay | Admitting: Internal Medicine

## 2021-10-31 DIAGNOSIS — F331 Major depressive disorder, recurrent, moderate: Secondary | ICD-10-CM

## 2021-11-01 ENCOUNTER — Other Ambulatory Visit: Payer: Self-pay | Admitting: Internal Medicine

## 2021-11-04 NOTE — Telephone Encounter (Signed)
Overdue for follow up please schedule then can have 90 day no refills thanks

## 2021-11-04 NOTE — Telephone Encounter (Signed)
Last refill 10/03/20. Please advise

## 2021-11-24 ENCOUNTER — Encounter: Payer: Self-pay | Admitting: *Deleted

## 2021-12-06 ENCOUNTER — Ambulatory Visit
Admission: EM | Admit: 2021-12-06 | Discharge: 2021-12-06 | Disposition: A | Discharge status: Home / Self Care | Payer: Medicare HMO | Attending: Emergency Medicine | Admitting: Emergency Medicine

## 2021-12-06 DIAGNOSIS — M5442 Lumbago with sciatica, left side: Secondary | ICD-10-CM

## 2021-12-06 MED ORDER — BACLOFEN 10 MG PO TABS: 5.0000 mg | ORAL_TABLET | Freq: Every day | ORAL | 0 refills | Status: AC

## 2021-12-06 MED ORDER — METHYLPREDNISOLONE 4 MG PO TBPK: ORAL_TABLET | ORAL | 0 refills | Status: DC

## 2021-12-06 MED ORDER — KETOROLAC TROMETHAMINE 30 MG/ML IJ SOLN
30.0000 mg | Freq: Once | INTRAMUSCULAR | Status: AC
  Administered 2021-12-06: 30 mg via INTRAMUSCULAR

## 2021-12-06 NOTE — Discharge Instructions (Signed)
During your visit today, you received an injection of ketorolac, high-dose nonsteroidal anti-inflammatory pain medication that should significantly reduce your pain for the next 6 to 8 hours.    This evening, please begin taking baclofen 10 mg.  This is a highly effective muscle relaxer and antispasmodic which should continue to provide you with relaxation of your tense muscles, allow you to sleep well and to keep your pain under control.   Tomorrow morning, please begin taking methylprednisolone.  Please take 1 full row tablets at once with your breakfast meal.  If you have had significant resolution of your pain before you finish the entire prescription, please feel free to discontinue.  It is not important to finish every ta dose blet.   During the day, please set aside time to apply ice to the affected area 4 times daily for 20 minutes each application.  This can be achieved by using a bag of frozen peas or corn, a Ziploc bag filled with ice and water, or Ziploc bag filled with half rubbing alcohol and half Dawn dish detergent, frozen into a slush.  Please be careful not to apply ice directly to your skin, always place a soft cloth between you and the ice pack.   Please avoid attempts to stretch or strengthen the affected area until you are feeling completely pain-free.  Attempts to do so will only prolong the healing process.  As we discussed, please reach out to your primary care provider first thing tomorrow morning to let them know about your acutely worsening back pain, further imaging may be recommended.   I also recommend that you remain out of work for the next several days, I provided you with a note to return to work in 3 days.  If you feel that you need this time extended, please follow-up with your primary care provider or return to urgent care for reevaluation so that we can provide you with a note for another 3 days.   Thank you for visiting urgent care today.  We appreciate the  opportunity to participate in your care.

## 2021-12-06 NOTE — ED Provider Notes (Signed)
[facility]    CSN: [id number] Arrival date & time: 12/06/21  1448    HISTORY   Chief Complaint  Patient presents with   Back Pain   HPI Anna Lyons is a pleasant, 67 y.o. female who presents to urgent care today. Patient complains of left lower back pain for the past few weeks which is gotten progressively worse over the past few days.  Patient reports difficulty standing, lying down, walking, sitting secondary to pain.  Patient reports a history of lower back pain.  Patient states pain is also now radiating down her left leg which is new.  Previous imaging reviewed with patient, MRI of lumbar spine performed in April 2021 demonstrate bulging disc and mild for aminal stenosis at L2-3, L3-4 and L4-5, and facet hypertrophy at L4-5.  Patient states she is here today because she needs pain relief.  The history is provided by the patient.    Past Medical History:  Diagnosis Date   Angina    Breast cyst    Cancer ([employer])    Depression    Hypertension    RLS (restless legs syndrome) 11/16/2011   Seizures ([place]) 2016   last seizure="last year, 2016" per pt.   Stroke ([place]) 04/20/2011   a. 04/20/11 Left subcortical infarct treated w/ TPA;  b. 04/2011 Echo: EF 55-60%;  c. 04/2011 Normal Carotid u/s  d. Residual "walk w/limp on right; unable to grasp w/right hand"   TIA (transient ischemic attack) 08/2010   Tobacco abuse    a. quit @ time of CVA 04/2011.   Patient Active Problem List   Diagnosis Date Noted   Weakness 04/30/2021   Acute focal neurological deficit 04/30/2021   Insomnia 10/03/2020   Dyslipidemia, goal LDL below 70 10/11/2019   B12 deficiency 10/11/2019   Need for hepatitis C screening test 10/11/2019   Need for shingles vaccine 10/11/2019   Severe episode of recurrent major depressive disorder, with psychotic features ([place]) 10/11/2019   Claudication ([place]) 02/28/2019   PTSD (post-traumatic stress disorder) 04/01/2017   Routine general medical examination at a  health care facility 11/15/2014   TIA (transient ischemic attack) 08/16/2013   Hemiparesis affecting right side as late effect of cerebrovascular accident ([place]) 06/18/2013   Seizure disorder ([place]) 05/19/2012   NSTEMI, initial episode of care; Type 2 05/19/2012   Hypokalemia 11/16/2011   RLS (restless legs syndrome) 11/16/2011   Other chest pain 05/19/2011   Essential hypertension, benign 05/19/2011   Tobacco abuse 05/19/2011   Past Surgical History:  Procedure Laterality Date   BREAST RECONSTRUCTION     bilaterally   CARPAL TUNNEL RELEASE Left 1990's   CARPAL TUNNEL RELEASE Left 12/08/2015   CESAREAN SECTION  1979   KNEE ARTHROSCOPY Left 1990's    cartilage repair   LEFT HEART CATHETERIZATION WITH CORONARY ANGIOGRAM N/A 05/20/2011   Procedure: LEFT HEART CATHETERIZATION WITH CORONARY ANGIOGRAM;  Surgeon: Josue Hector, MD;  Location: [facility] CATH LAB;  Service: Cardiovascular;  Laterality: N/A;   MASTECTOMY Bilateral 1980's   bilateral with reconstruction, breast cancer on left   OB History   No obstetric history on file.    Home Medications    Prior to Admission medications   Medication Sig Start Date End Date Taking? Authorizing Provider  albuterol (PROVENTIL HFA;VENTOLIN HFA) 108 (90 Base) MCG/ACT inhaler Inhale 2 puffs into the lungs every 6 (six) hours as needed for wheezing or shortness of breath. 12/28/16   Hoyt Koch, MD  ARIPiprazole (ABILIFY)  2 MG tablet Take 1 tablet (2 mg total) by mouth daily. 10/03/20   Hoyt Koch, MD  aspirin EC 81 MG tablet Take 81 mg by mouth daily.    [provider]  atorvastatin (LIPITOR) 80 MG tablet TAKE ONE TABLET BY MOUTH AT BEDTIME 10/16/21   Hoyt Koch, MD  cholecalciferol (VITAMIN D3) 25 MCG (1000 UNIT) tablet Take 1,000 Units by mouth daily.    [provider]  DULoxetine (CYMBALTA) 60 MG capsule TAKE ONE CAPSULE BY MOUTH DAILY 11/04/21   Hoyt Koch, MD  ferrous sulfate 324 MG  TBEC Take 324 mg by mouth.    [provider]  gabapentin (NEURONTIN) 300 MG capsule TAKE ONE CAPSULE BY MOUTH AT BEDTIME 11/02/21   Hoyt Koch, MD  levETIRAcetam (KEPPRA) 500 MG tablet Take 1 tablet (500 mg total) by mouth 2 (two) times daily. 10/03/20   Hoyt Koch, MD  Magnesium 250 MG TABS Take by mouth.    [provider]  Multiple Vitamin (MULTIVITAMIN WITH MINERALS) TABS tablet Take 1 tablet by mouth daily.    [provider]  pramipexole (MIRAPEX) 0.5 MG tablet Take 1 tablet (0.5 mg total) by mouth at bedtime as needed. 04/30/21   Hoyt Koch, MD  temazepam (RESTORIL) 15 MG capsule TAKE ONE CAPSULE BY MOUTH AT BEDTIME 10/16/21   Hoyt Koch, MD  vitamin B-12 (CYANOCOBALAMIN) 1000 MCG tablet Take 1,000 mcg by mouth daily.    [provider]    Family History Family History  Problem Relation Age of Onset   Lupus Mother        died early 45's.   Emphysema Father        died late 8's.   COPD Father    Pancreatic cancer Brother    Bladder Cancer Brother    Rectal cancer Brother    Suicidality Brother    Colon cancer Neg Hx    Social History Social History   Tobacco Use   Smoking status: Every Day    Packs/day: 0.75    Years: 45.00    Total pack years: 33.75    Types: Cigarettes   Smokeless tobacco: Never   Tobacco comments:    Currently smoking 1/2 pack per day  Vaping Use   Vaping Use: Never used  Substance Use Topics   Alcohol use: Yes    Alcohol/week: 7.0 standard drinks of alcohol    Types: 7 Glasses of wine per week    Comment: very occasional   Drug use: No   Allergies   Codeine and Oxycodone-acetaminophen  Review of Systems Review of Systems Pertinent findings revealed after performing a 14 point review of systems has been noted in the history of present illness.  Physical Exam Triage Vital Signs ED Triage Vitals  Enc Vitals Group     BP 11/28/20 0827 (!) 147/82     Pulse Rate  11/28/20 0827 72     Resp 11/28/20 0827 18     Temp 11/28/20 0827 98.3 F (36.8 C)     Temp Source 11/28/20 0827 Oral     SpO2 11/28/20 0827 98 %     Weight --      Height --      Head Circumference --      Peak Flow --      Pain Score 11/28/20 0826 5     Pain Loc --      Pain Edu? --  Excl. in [place]? --    Updated Vital Signs BP 118/80 (BP Location: Right Arm)   Pulse 86   Temp 97.8 F (36.6 C) (Oral)   Resp 16   SpO2 96%   Physical Exam Vitals and nursing note reviewed.  Constitutional:      General: She is not in acute distress.    Appearance: Normal appearance.  HENT:     Head: Normocephalic and atraumatic.  Eyes:     Pupils: Pupils are equal, round, and reactive to light.  Cardiovascular:     Rate and Rhythm: Normal rate and regular rhythm.  Pulmonary:     Effort: Pulmonary effort is normal.     Breath sounds: Normal breath sounds.  Musculoskeletal:     Cervical back: Normal, normal range of motion and neck supple.     Thoracic back: Normal.     Lumbar back: Spasms, tenderness and bony tenderness present. Decreased range of motion (2/2 pain). Positive left straight leg raise test.  Skin:    General: Skin is warm and dry.  Neurological:     General: No focal deficit present.     Mental Status: She is alert and oriented to person, place, and time. Mental status is at baseline.     Cranial Nerves: Cranial nerves 2-12 are intact.     Sensory: Sensation is intact.     Motor: Motor function is intact.     Coordination: Coordination is intact.     Gait: Gait abnormal (2/2 pain).     Deep Tendon Reflexes: Reflexes are normal and symmetric.  Psychiatric:        Mood and Affect: Mood normal.        Behavior: Behavior normal.        Thought Content: Thought content normal.        Judgment: Judgment normal.     UC Couse / Diagnostics / Procedures:     Radiology No results found.  Procedures Procedures (including critical care time) EKG  Pending results:   Labs Reviewed - No data to display  Medications Ordered in UC: Medications  ketorolac (TORADOL) 30 MG/ML injection 30 mg (30 mg Intramuscular Given 12/06/21 1632)    UC Diagnoses / Final Clinical Impressions(s)   I have reviewed the triage vital signs and the nursing notes.  Pertinent labs & imaging results that were available during my care of the patient were reviewed by me and considered in my medical decision making (see chart for details).    Final diagnoses:  Acute left-sided low back pain with left-sided sciatica    Patient was provided with an injection during their visit today for acute pain relief, patient reported significant improvement of pain.  Patient was advised to:  Begin Medrol dose pack Take baclofen once daily 1 hour prior to bedtime Apply ice pack to affected area 4 times daily for 20 minutes each time Avoid stretching or strengthening exercises until pain is completely resolved Return to urgent care in the next 2 to 3 days for repeat ketorolac injection if needed Consider discussing repeat imaging, referral to physical therapy, chiropractic care, orthopedic follow-up with her primary care provider Return precautions advised  ED Prescriptions     Medication Sig Dispense Auth. Provider   methylPREDNISolone (MEDROL DOSEPAK) 4 MG TBPK tablet Take 24 mg on day 1, 20 mg on day 2, 16 mg on day 3, 12 mg on day 4, 8 mg on day 5, 4 mg on day 6.  Take all tablets  in each row at once, do not spread tablets out throughout the day. 21 tablet Lynden Oxford Scales, PA-C   baclofen (LIORESAL) 10 MG tablet Take 0.5 tablets (5 mg total) by mouth at bedtime for 14 days. 7 tablet Lynden Oxford Scales, PA-C      PDMP not reviewed this encounter.  Discharge Instructions:   Discharge Instructions      During your visit today, you received an injection of ketorolac, high-dose nonsteroidal anti-inflammatory pain medication that should significantly reduce your pain for the  next 6 to 8 hours.    This evening, please begin taking baclofen 10 mg.  This is a highly effective muscle relaxer and antispasmodic which should continue to provide you with relaxation of your tense muscles, allow you to sleep well and to keep your pain under control.   Tomorrow morning, please begin taking methylprednisolone.  Please take 1 full row tablets at once with your breakfast meal.  If you have had significant resolution of your pain before you finish the entire prescription, please feel free to discontinue.  It is not important to finish every ta dose blet.   During the day, please set aside time to apply ice to the affected area 4 times daily for 20 minutes each application.  This can be achieved by using a bag of frozen peas or corn, a Ziploc bag filled with ice and water, or Ziploc bag filled with half rubbing alcohol and half Dawn dish detergent, frozen into a slush.  Please be careful not to apply ice directly to your skin, always place a soft cloth between you and the ice pack.   Please avoid attempts to stretch or strengthen the affected area until you are feeling completely pain-free.  Attempts to do so will only prolong the healing process.  As we discussed, please reach out to your primary care provider first thing tomorrow morning to let them know about your acutely worsening back pain, further imaging may be recommended.   I also recommend that you remain out of work for the next several days, I provided you with a note to return to work in 3 days.  If you feel that you need this time extended, please follow-up with your primary care provider or return to urgent care for reevaluation so that we can provide you with a note for another 3 days.   Thank you for visiting urgent care today.  We appreciate the opportunity to participate in your care.       Disposition Upon Discharge:  Condition: stable for discharge home Home: take medications as prescribed; routine discharge  instructions as discussed; follow up as advised.  Patient presented with an acute illness with associated systemic symptoms and significant discomfort requiring urgent management. In my opinion, this is a condition that a prudent lay person (someone who possesses an average knowledge of health and medicine) may potentially expect to result in complications if not addressed urgently such as respiratory distress, impairment of bodily function or dysfunction of bodily organs.   Routine symptom specific, illness specific and/or disease specific instructions were discussed with the patient and/or caregiver at length.   As such, the patient has been evaluated and assessed, work-up was performed and treatment was provided in alignment with urgent care protocols and evidence based medicine.  Patient/parent/caregiver has been advised that the patient may require follow up for further testing and treatment if the symptoms continue in spite of treatment, as clinically indicated and appropriate.  Patient/parent/caregiver has been  advised to report to orthopedic urgent care clinic or return to the [facility] or PCP in 3-5 days if no better; follow-up with orthopedics, PCP or the Emergency Department if new signs and symptoms develop or if the current signs or symptoms continue to change or worsen for further workup, evaluation and treatment as clinically indicated and appropriate  The patient will follow up with their current PCP if and as advised. If the patient does not currently have a PCP we will have assisted them in obtaining one.   The patient may need specialty follow up if the symptoms continue, in spite of conservative treatment and management, for further workup, evaluation, consultation and treatment as clinically indicated and appropriate.  Patient/parent/caregiver verbalized understanding and agreement of plan as discussed.  All questions were addressed during visit.  Please see discharge instructions below  for further details of plan.  This office note has been dictated using Museum/gallery curator.  Unfortunately, this method of dictation can sometimes lead to typographical or grammatical errors.  I apologize for your inconvenience in advance if this occurs.  Please do not hesitate to reach out to me if clarification is needed.      Lynden Oxford Scales, [place] 12/09/21 [phone]

## 2021-12-06 NOTE — ED Triage Notes (Signed)
Pt c/o left lower back pain for a few weeks but states it recently got worse.   Home interventions: none

## 2021-12-08 ENCOUNTER — Ambulatory Visit (INDEPENDENT_AMBULATORY_CARE_PROVIDER_SITE_OTHER): Payer: Medicare HMO | Admitting: Internal Medicine

## 2021-12-08 ENCOUNTER — Encounter: Payer: Self-pay | Admitting: Internal Medicine

## 2021-12-08 VITALS — BP 136/80 | HR 72 | Temp 97.8°F | Ht 66.0 in | Wt 129.2 lb

## 2021-12-08 DIAGNOSIS — F333 Major depressive disorder, recurrent, severe with psychotic symptoms: Secondary | ICD-10-CM | POA: Diagnosis not present

## 2021-12-08 DIAGNOSIS — M549 Dorsalgia, unspecified: Secondary | ICD-10-CM | POA: Insufficient documentation

## 2021-12-08 DIAGNOSIS — F331 Major depressive disorder, recurrent, moderate: Secondary | ICD-10-CM | POA: Diagnosis not present

## 2021-12-08 DIAGNOSIS — R051 Acute cough: Secondary | ICD-10-CM

## 2021-12-08 DIAGNOSIS — Z720 Tobacco use: Secondary | ICD-10-CM

## 2021-12-08 MED ORDER — AZITHROMYCIN 250 MG PO TABS: ORAL_TABLET | ORAL | 0 refills | Status: AC

## 2021-12-08 MED ORDER — ARIPIPRAZOLE 2 MG PO TABS: 2.0000 mg | ORAL_TABLET | Freq: Every day | ORAL | 3 refills | Status: DC

## 2021-12-08 NOTE — Assessment & Plan Note (Signed)
Suspect COPD flare. She is smoking less lately. Some low O2 with walking which is new improved with rest back to normal. Rx z-pack to cover for infection. Encouraged to quit smoking altogether if possible.

## 2021-12-08 NOTE — Patient Instructions (Addendum)
Keep taking the steroid until it is done.  We have sent in an antibiotic azithromycin to take 2 pills today, then 1 pill daily until gone.

## 2021-12-08 NOTE — Progress Notes (Signed)
   Subjective:   Patient ID: Anna Lyons, female    DOB: July 29, 1955, 66 y.o.   MRN: [id number]  HPI The patient is a 66 YO female coming in for ER follow up (back pain, given steroids and baclofen). This is helping and pain is present but improving. Having some cough as well.   Review of Systems  Constitutional:  Positive for activity change. Negative for appetite change, chills, fatigue, fever and unexpected weight change.  Respiratory:  Positive for cough.   Cardiovascular: Negative.   Gastrointestinal: Negative.   Musculoskeletal:  Positive for arthralgias, back pain and myalgias. Negative for gait problem and joint swelling.  Skin: Negative.   Neurological: Negative.   Psychiatric/Behavioral:  Positive for decreased concentration and dysphoric mood.     Objective:  Physical Exam  Vitals:   12/08/21 1057 12/08/21 1125  BP: 136/80   Pulse: 72 72  Temp: 97.8 F (36.6 C)   TempSrc: Oral   SpO2: (!) 88% 96%  Weight: 129 lb 4 oz (58.6 kg)   Height: '5\' 6"'$  (1.676 m)     Assessment & Plan:

## 2021-12-08 NOTE — Assessment & Plan Note (Signed)
Persistent cough and encouraged to quit. Reminded of risk/harm from smoking.

## 2021-12-08 NOTE — Assessment & Plan Note (Signed)
Has started steroid dose pack and is improving. Using baclofen at night time to help. Advised to finish steroids and this should improve within 1-2 weeks. If worsening or not improving let us know.

## 2021-12-08 NOTE — Assessment & Plan Note (Signed)
This is worse and she is possibly out of abilify 2 mg daily since September. Refilled this and encouraged to resume. She has not seen psych despite referral. She is severely depressed and not wanting to leave bed when not working.

## 2021-12-14 ENCOUNTER — Other Ambulatory Visit: Payer: Self-pay | Admitting: Internal Medicine

## 2021-12-14 DIAGNOSIS — F331 Major depressive disorder, recurrent, moderate: Secondary | ICD-10-CM

## 2021-12-14 DIAGNOSIS — F333 Major depressive disorder, recurrent, severe with psychotic symptoms: Secondary | ICD-10-CM

## 2022-02-09 ENCOUNTER — Telehealth: Payer: Self-pay | Admitting: Internal Medicine

## 2022-02-09 ENCOUNTER — Other Ambulatory Visit: Payer: Self-pay | Admitting: Internal Medicine

## 2022-02-09 NOTE — Telephone Encounter (Signed)
Due for physical please schedule and okay with 30 day fill until then

## 2022-02-09 NOTE — Telephone Encounter (Signed)
While scheduling AWV, patient requested I send a message to clinical staff- she needs her generic Keppra refilled (Levetiracetam).

## 2022-02-10 ENCOUNTER — Other Ambulatory Visit: Payer: Self-pay

## 2022-02-10 MED ORDER — LEVETIRACETAM 500 MG PO TABS: 500.0000 mg | ORAL_TABLET | Freq: Two times a day (BID) | ORAL | 0 refills | Status: DC

## 2022-02-10 NOTE — Telephone Encounter (Signed)
I put down a follow up instead of physical :ERROR

## 2022-02-10 NOTE — Telephone Encounter (Signed)
Refill has been sent in for patient. 

## 2022-02-22 ENCOUNTER — Ambulatory Visit: Payer: Medicare HMO | Admitting: Internal Medicine

## 2022-02-23 ENCOUNTER — Ambulatory Visit (INDEPENDENT_AMBULATORY_CARE_PROVIDER_SITE_OTHER): Payer: Medicare HMO | Admitting: Internal Medicine

## 2022-02-23 VITALS — BP 130/80 | HR 66 | Temp 98.3°F | Ht 66.0 in | Wt 131.4 lb

## 2022-02-23 DIAGNOSIS — Z0000 Encounter for general adult medical examination without abnormal findings: Secondary | ICD-10-CM

## 2022-02-23 DIAGNOSIS — Z720 Tobacco use: Secondary | ICD-10-CM

## 2022-02-23 DIAGNOSIS — I10 Essential (primary) hypertension: Secondary | ICD-10-CM | POA: Diagnosis not present

## 2022-02-23 DIAGNOSIS — E538 Deficiency of other specified B group vitamins: Secondary | ICD-10-CM

## 2022-02-23 DIAGNOSIS — E785 Hyperlipidemia, unspecified: Secondary | ICD-10-CM | POA: Diagnosis not present

## 2022-02-23 DIAGNOSIS — I69351 Hemiplegia and hemiparesis following cerebral infarction affecting right dominant side: Secondary | ICD-10-CM

## 2022-02-23 DIAGNOSIS — G40909 Epilepsy, unspecified, not intractable, without status epilepticus: Secondary | ICD-10-CM

## 2022-02-23 DIAGNOSIS — F333 Major depressive disorder, recurrent, severe with psychotic symptoms: Secondary | ICD-10-CM

## 2022-02-23 DIAGNOSIS — L989 Disorder of the skin and subcutaneous tissue, unspecified: Secondary | ICD-10-CM

## 2022-02-23 LAB — COMPREHENSIVE METABOLIC PANEL
ALT: 13 U/L (ref 0–35)
AST: 21 U/L (ref 0–37)
Albumin: 4 g/dL (ref 3.5–5.2)
Alkaline Phosphatase: 81 U/L (ref 39–117)
BUN: 12 mg/dL (ref 6–23)
CO2: 31 mEq/L (ref 19–32)
Calcium: 9.3 mg/dL (ref 8.4–10.5)
Chloride: 99 mEq/L (ref 96–112)
Creatinine, Ser: 0.6 mg/dL (ref 0.40–1.20)
GFR: 92.46 mL/min (ref >60.00)
Glucose, Bld: 87 mg/dL (ref 70–99)
Potassium: 4.5 mEq/L (ref 3.5–5.1)
Sodium: 138 mEq/L (ref 135–145)
Total Bilirubin: 0.4 mg/dL (ref 0.2–1.2)
Total Protein: 7.2 g/dL (ref 6.0–8.3)

## 2022-02-23 LAB — CBC
HCT: 37 % (ref 36.0–46.0)
Hemoglobin: 12.8 g/dL (ref 12.0–15.0)
MCHC: 34.6 g/dL (ref 30.0–36.0)
MCV: 92.4 fl (ref 78.0–100.0)
Platelets: 224 10*3/uL (ref 150.0–400.0)
RBC: 4.01 Mil/uL (ref 3.87–5.11)
RDW: 12.6 % (ref 11.5–15.5)
WBC: 3.5 10*3/uL — ABNORMAL LOW (ref 4.0–10.5)

## 2022-02-23 LAB — LIPID PANEL
Cholesterol: 108 mg/dL (ref 0–200)
HDL: 57.6 mg/dL (ref >39.00)
LDL Cholesterol: 38 mg/dL (ref 0–99)
NonHDL: 50.89
Total CHOL/HDL Ratio: 2
Triglycerides: 64 mg/dL (ref 0.0–149.0)
VLDL: 12.8 mg/dL (ref 0.0–40.0)

## 2022-02-23 LAB — VITAMIN B12: Vitamin B-12: 1025 pg/mL — ABNORMAL HIGH (ref 211–911)

## 2022-02-23 LAB — TSH: TSH: 2.41 u[IU]/mL (ref 0.35–5.50)

## 2022-02-23 MED ORDER — GABAPENTIN 300 MG PO CAPS: 300.0000 mg | ORAL_CAPSULE | Freq: Two times a day (BID) | ORAL | 3 refills | Status: DC

## 2022-02-23 NOTE — Patient Instructions (Addendum)
We will get you in with the plastic surgeon for the area on the face.[id number]

## 2022-02-23 NOTE — Progress Notes (Signed)
Subjective:   Patient ID: Anna Lyons, female    DOB: 01/14/1956, 67 y.o.   MRN: [id number]  HPI Here for medicare wellness and physical, no new complaints. Please see A/P for status and treatment of chronic medical problems.   Diet: heart healthy Physical activity: sedentary, stays in bed Depression/mood screen: positive mild improvement from prior see A/P Hearing: intact to whispered voice, mild loss Visual acuity: grossly normal with lens, performs annual eye exam  ADLs: capable Fall risk: none Home safety: good Cognitive evaluation: intact to orientation, naming, recall and repetition EOL planning: adv directives discussed  [place] Visit from 02/23/2022 in [facility] at [place]  PHQ-2 Total Score 4       [place] Visit from 02/23/2022 in [facility] at [place]  PHQ-9 Total Score 13         02/08/2020   11:09 AM 02/15/2020   10:10 AM 02/20/2021    1:08 PM 12/06/2021    4:09 PM 02/23/2022    2:29 PM  [place] in the past year? 0 1 1  0  Was there an injury with Fall? 0 0 0  0  Fall Risk Category Calculator 0 2 1  0  Fall Risk Category (Retired) Low Moderate Low    (RETIRED) Patient Fall Risk Level Low fall risk  Low fall risk Low fall risk   Patient at Risk for Falls Due to No Fall Risks;Other (Comment)  No Fall Risks  No Fall Risks  Fall risk Follow up Falls evaluation completed;Education provided  Falls evaluation completed  Falls evaluation completed    I have personally reviewed and have noted 1. The patient's medical and social history - reviewed today no changes 2. Their use of alcohol, tobacco or illicit drugs 3. Their current medications and supplements 4. The patient's functional ability including ADL's, fall risks, home safety risks and hearing or visual impairment. 5. Diet and physical activities 6. Evidence for depression or mood disorders 7. Care team reviewed and updated 8.   The patient is not on an opioid pain medication  Patient Care Team: Hoyt Koch, MD as PCP - General (Internal Medicine) Letta Pate, Luanna Salk, MD as Consulting Physician (Physical Medicine and Rehabilitation) Charlton Haws, [facility] as Pharmacist (Pharmacist) Past Medical History:  Diagnosis Date   Angina    Breast cyst    Cancer [facility])    Depression    Hypertension    RLS (restless legs syndrome) 11/16/2011   Seizures ([place]) 2016   last seizure="last year, 2016" per pt.   Stroke ([place]) 04/20/2011   a. 04/20/11 Left subcortical infarct treated w/ TPA;  b. 04/2011 Echo: EF 55-60%;  c. 04/2011 Normal Carotid u/s  d. Residual "walk w/limp on right; unable to grasp w/right hand"   TIA (transient ischemic attack) 08/2010   Tobacco abuse    a. quit @ time of CVA 04/2011.   Past Surgical History:  Procedure Laterality Date   BREAST RECONSTRUCTION     bilaterally   CARPAL TUNNEL RELEASE Left 1990's   CARPAL TUNNEL RELEASE Left 12/08/2015   CESAREAN SECTION  1979   KNEE ARTHROSCOPY Left 1990's    cartilage repair   LEFT HEART CATHETERIZATION WITH CORONARY ANGIOGRAM N/A 05/20/2011   Procedure: LEFT HEART CATHETERIZATION WITH CORONARY ANGIOGRAM;  Surgeon: Josue Hector, MD;  Location: [facility] CATH LAB;  Service: Cardiovascular;  Laterality: N/A;   MASTECTOMY Bilateral 1980's  bilateral with reconstruction, breast cancer on left   Family History  Problem Relation Age of Onset   Lupus Mother        died early 84's.   Emphysema Father        died late 34's.   COPD Father    Pancreatic cancer Brother    Bladder Cancer Brother    Rectal cancer Brother    Suicidality Brother    Colon cancer Neg Hx    Review of Systems  Constitutional:  Positive for activity change and appetite change.  HENT: Negative.    Eyes: Negative.   Respiratory:  Negative for cough, chest tightness and shortness of breath.   Cardiovascular:  Negative for chest pain, palpitations and leg swelling.   Gastrointestinal:  Negative for abdominal distention, abdominal pain, constipation, diarrhea, nausea and vomiting.  Musculoskeletal: Negative.   Skin: Negative.   Neurological: Negative.   Psychiatric/Behavioral:  Positive for decreased concentration, dysphoric mood and sleep disturbance.     Objective:  Physical Exam Constitutional:      Appearance: She is well-developed.  HENT:     Head: Normocephalic and atraumatic.  Cardiovascular:     Rate and Rhythm: Normal rate and regular rhythm.  Pulmonary:     Effort: Pulmonary effort is normal. No respiratory distress.     Breath sounds: Normal breath sounds. No wheezing or rales.  Abdominal:     General: Bowel sounds are normal. There is no distension.     Palpations: Abdomen is soft.     Tenderness: There is no abdominal tenderness. There is no rebound.  Musculoskeletal:     Cervical back: Normal range of motion.  Skin:    General: Skin is warm and dry.  Neurological:     Mental Status: She is alert and oriented to person, place, and time.     Coordination: Coordination normal.     Vitals:   02/23/22 1424  BP: 130/80  Pulse: 66  Temp: 98.3 F (36.8 C)  TempSrc: Oral  SpO2: 92%  Weight: 131 lb 6.4 oz (59.6 kg)  Height: '5\' 6"'$  (1.676 m)    Assessment & Plan:

## 2022-02-26 ENCOUNTER — Encounter: Payer: Self-pay | Admitting: Internal Medicine

## 2022-02-26 NOTE — Assessment & Plan Note (Signed)
Checking B12 and adjust as needed.

## 2022-02-26 NOTE — Assessment & Plan Note (Signed)
Checking lipid panel and adjust lipitor 80 mg daily as needed. Goal <70 LDL.

## 2022-02-26 NOTE — Assessment & Plan Note (Signed)
Flu shot up to date. Covid-19 up to date. Pneumonia complete. Shingrix counseled due at pharmacy. Tetanus due 2025. Colonoscopy due 2027. Mammogram due 2025, pap smear aged out and dexa complete. Counseled about sun safety and mole surveillance. Counseled about the dangers of distracted driving. Given 10 year screening recommendations.

## 2022-02-26 NOTE — Assessment & Plan Note (Signed)
BP at goal off meds. Due to past stroke needs ongoing close monitoring. Checking CMP and adjust as needed.

## 2022-02-26 NOTE — Assessment & Plan Note (Signed)
No seizures. Continue keppra 500 mg BID and checking CMP, CBC, keppra level. Adjust as needed.

## 2022-02-26 NOTE — Assessment & Plan Note (Signed)
Overall mild improvement from resuming abilify 2 mg daily. Continues on cymbalta 60 mg daily. Not well controlled and again asked her to see psychiatry which we have referred her to. She is not sure about this. Sleeping a lot when not working and struggling. 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

## 2022-02-26 NOTE — Assessment & Plan Note (Signed)
Counseled to quit and she is unable at this time. Smoking less due to less energy. Reminded about risk and harm from smoking.

## 2022-02-26 NOTE — Assessment & Plan Note (Signed)
Overall stable but still has weakness and numbness symptoms on the right side. Checking lipid panel and HgA1c. BP at goal. Taking statin and aspirin daily for prevention. No new stroke symptoms.

## 2022-02-27 LAB — LEVETIRACETAM LEVEL: Keppra (Levetiracetam): 27.5 ug/mL

## 2022-03-01 ENCOUNTER — Other Ambulatory Visit: Payer: Self-pay | Admitting: Internal Medicine

## 2022-03-24 ENCOUNTER — Institutional Professional Consult (permissible substitution): Payer: Medicare HMO | Admitting: Plastic Surgery

## 2022-03-25 ENCOUNTER — Encounter: Payer: Self-pay | Admitting: Plastic Surgery

## 2022-03-25 ENCOUNTER — Ambulatory Visit: Payer: Medicare HMO | Admitting: Plastic Surgery

## 2022-03-25 VITALS — BP 152/85 | HR 69 | Resp 16 | Ht 66.0 in | Wt 131.0 lb

## 2022-03-25 DIAGNOSIS — L989 Disorder of the skin and subcutaneous tissue, unspecified: Secondary | ICD-10-CM

## 2022-03-25 NOTE — Progress Notes (Signed)
Referring Provider Hoyt Koch, MD [place],  [place] [id number]   CC:  Chief Complaint  Patient presents with   Consult      Anna Lyons is an 67 y.o. female.  HPI: Ms. Obeirne is a 67 year old female who has a 1 cm mass over the zygomatic arch on the left-hand side.  She states that this has been there for several months and she is very unhappy with the appearance.  She denies any drainage or erythema around the mass.  Allergies  Allergen Reactions   Codeine Itching   Oxycodone-Acetaminophen Hives    Outpatient Encounter Medications as of 03/25/2022  Medication Sig   albuterol (PROVENTIL HFA;VENTOLIN HFA) 108 (90 Base) MCG/ACT inhaler Inhale 2 puffs into the lungs every 6 (six) hours as needed for wheezing or shortness of breath.   ARIPiprazole (ABILIFY) 2 MG tablet TAKE ONE TABLET BY MOUTH DAILY   aspirin EC 81 MG tablet Take 81 mg by mouth daily.   atorvastatin (LIPITOR) 80 MG tablet TAKE ONE TABLET BY MOUTH AT BEDTIME   cholecalciferol (VITAMIN D3) 25 MCG (1000 UNIT) tablet Take 1,000 Units by mouth daily.   DULoxetine (CYMBALTA) 60 MG capsule TAKE ONE CAPSULE BY MOUTH DAILY   ferrous sulfate 324 MG TBEC Take 324 mg by mouth.   gabapentin (NEURONTIN) 300 MG capsule Take 1 capsule (300 mg total) by mouth 2 (two) times daily.   levETIRAcetam (KEPPRA) 500 MG tablet Take 1 tablet by mouth twice daily   Magnesium 250 MG TABS Take by mouth.   Multiple Vitamin (MULTIVITAMIN WITH MINERALS) TABS tablet Take 1 tablet by mouth daily.   pramipexole (MIRAPEX) 0.5 MG tablet Take 1 tablet (0.5 mg total) by mouth at bedtime as needed.   temazepam (RESTORIL) 15 MG capsule TAKE ONE CAPSULE BY MOUTH AT BEDTIME   vitamin B-12 (CYANOCOBALAMIN) 1000 MCG tablet Take 1,000 mcg by mouth daily.   No facility-administered encounter medications on file as of 03/25/2022.     Past Medical History:  Diagnosis Date   Angina    Breast cyst    Cancer ([place])    Depression     Hypertension    RLS (restless legs syndrome) 11/16/2011   Seizures ([place]) 2016   last seizure="last year, 2016" per pt.   Stroke ([place]) 04/20/2011   a. 04/20/11 Left subcortical infarct treated w/ TPA;  b. 04/2011 Echo: EF 55-60%;  c. 04/2011 Normal Carotid u/s  d. Residual "walk w/limp on right; unable to grasp w/right hand"   TIA (transient ischemic attack) 08/2010   Tobacco abuse    a. quit @ time of CVA 04/2011.    Past Surgical History:  Procedure Laterality Date   BREAST RECONSTRUCTION     bilaterally   CARPAL TUNNEL RELEASE Left 1990's   CARPAL TUNNEL RELEASE Left 12/08/2015   CESAREAN SECTION  1979   KNEE ARTHROSCOPY Left 1990's    cartilage repair   LEFT HEART CATHETERIZATION WITH CORONARY ANGIOGRAM N/A 05/20/2011   Procedure: LEFT HEART CATHETERIZATION WITH CORONARY ANGIOGRAM;  Surgeon: Josue Hector, MD;  Location: [facility] CATH LAB;  Service: Cardiovascular;  Laterality: N/A;   MASTECTOMY Bilateral 1980's   bilateral with reconstruction, breast cancer on left    Family History  Problem Relation Age of Onset   Lupus Mother        died early 44's.   Emphysema Father        died late 58's.   COPD Father  Pancreatic cancer Brother    Bladder Cancer Brother    Rectal cancer Brother    Suicidality Brother    Colon cancer Neg Hx     Social History   Social History Narrative   Pt lives alone.   Caffeine Use: 1-3 cups of caffeine daily (tea)     Review of Systems General: Denies fevers, chills, weight loss CV: Denies chest pain, shortness of breath, palpitations Skin: 1 cm mass over the zygomatic arch without evidence of infection.  Physical Exam    03/25/2022    1:47 PM 02/23/2022    2:24 PM 12/08/2021   11:25 AM  Vitals with BMI  Height 5' 6"$  5' 6"$    Weight 131 lbs 131 lbs 6 oz   BMI [id number]   Systolic 0000000 [id number]   Diastolic 85 80   Pulse 69 66 72    General:  No acute distress,  Alert and oriented, Non-Toxic, Normal speech and affect Integument: As noted a  1 cm mass on the left side of the face.  It is mobile and not fixed to any underlying structures. Mammogram: Not applicable Assessment/Plan Skin lesion: It is not clear what this mass is.  She thought it was a lipoma but it does not have the characteristics of a lipoma.  I think it should be removed for pathologic diagnosis.  We discussed the location of the incision and the unpredictable nature of scarring.  She understands that this may recur.  Will schedule for an office procedure.  Camillia Herter 03/25/2022, 5:31 PM

## 2022-04-14 ENCOUNTER — Other Ambulatory Visit: Payer: Self-pay | Admitting: Internal Medicine

## 2022-04-21 ENCOUNTER — Other Ambulatory Visit: Payer: Self-pay | Admitting: Internal Medicine

## 2022-04-28 ENCOUNTER — Ambulatory Visit: Payer: Medicare HMO | Admitting: Plastic Surgery

## 2022-04-28 ENCOUNTER — Encounter: Payer: Self-pay | Admitting: Plastic Surgery

## 2022-04-28 VITALS — BP 156/93 | HR 65 | Resp 18

## 2022-04-28 DIAGNOSIS — D489 Neoplasm of uncertain behavior, unspecified: Secondary | ICD-10-CM

## 2022-04-28 DIAGNOSIS — L919 Hypertrophic disorder of the skin, unspecified: Secondary | ICD-10-CM | POA: Diagnosis not present

## 2022-04-28 DIAGNOSIS — L720 Epidermal cyst: Secondary | ICD-10-CM | POA: Diagnosis not present

## 2022-04-28 NOTE — Progress Notes (Signed)
Procedure Note  Preoperative Dx: Sebaceous cyst left cheek, skin tag left lateral canthus  Postoperative Dx: Same  Procedure: Excision of sebaceous cyst, shave of skin tag  Anesthesia: Lidocaine 1% with 1:100,000 epinephrine and 0.25% Sensorcaine   Indication for Procedure: Removal for pain and pathologic diagnosis  Description of Procedure: Risks and complications were explained to the patient including the possibility of recurrence as well as scar formation..  Consent was confirmed and the patient understands the risks and benefits.  The potential complications and alternatives were explained and the patient consents.  The patient expressed understanding the option of not having the procedure and the risks of a scar.  Time out was called and all information was confirmed to be correct.    The area was prepped and drapped.  Local anesthetic was injected in the subcutaneous tissues.  After waiting for the local to take affect a 1 cm incision was made on the left cheek overlying the mass.  Combination of blunt and sharp dissection were used to dissect down to the mass the mass was removed in pieces however the surgical site was inspected and all of the cyst wall had been removed.  The surgical site was irrigated.  After obtaining hemostasis, the surgical wound was closed with interrupted 4-0 Monocryl in the subcutaneous tissues and interrupted 5-0 Prolene sutures in the skin.  The skin tag at the lateral canthus was simply shaved.  The skin tag was approximately 1 mm in size.  The surgical wound measured 1 cm.  A dressing was applied.  The patient was given instructions on how to care for the area and a follow up appointment.  Thora tolerated the procedure well and there were no complications. The specimen was sent to pathology.

## 2022-05-03 DIAGNOSIS — F17210 Nicotine dependence, cigarettes, uncomplicated: Secondary | ICD-10-CM | POA: Diagnosis not present

## 2022-05-03 DIAGNOSIS — R29890 Loss of height: Secondary | ICD-10-CM | POA: Diagnosis not present

## 2022-05-03 DIAGNOSIS — N958 Other specified menopausal and perimenopausal disorders: Secondary | ICD-10-CM | POA: Diagnosis not present

## 2022-05-04 ENCOUNTER — Ambulatory Visit (INDEPENDENT_AMBULATORY_CARE_PROVIDER_SITE_OTHER): Payer: Medicare HMO | Admitting: Physician Assistant

## 2022-05-04 DIAGNOSIS — M9902 Segmental and somatic dysfunction of thoracic region: Secondary | ICD-10-CM | POA: Diagnosis not present

## 2022-05-04 DIAGNOSIS — M5134 Other intervertebral disc degeneration, thoracic region: Secondary | ICD-10-CM | POA: Diagnosis not present

## 2022-05-04 DIAGNOSIS — M5431 Sciatica, right side: Secondary | ICD-10-CM | POA: Diagnosis not present

## 2022-05-04 DIAGNOSIS — M9905 Segmental and somatic dysfunction of pelvic region: Secondary | ICD-10-CM | POA: Diagnosis not present

## 2022-05-04 DIAGNOSIS — M5136 Other intervertebral disc degeneration, lumbar region: Secondary | ICD-10-CM | POA: Diagnosis not present

## 2022-05-04 DIAGNOSIS — L720 Epidermal cyst: Secondary | ICD-10-CM

## 2022-05-04 DIAGNOSIS — Z719 Counseling, unspecified: Secondary | ICD-10-CM

## 2022-05-04 DIAGNOSIS — D489 Neoplasm of uncertain behavior, unspecified: Secondary | ICD-10-CM

## 2022-05-04 DIAGNOSIS — M9903 Segmental and somatic dysfunction of lumbar region: Secondary | ICD-10-CM | POA: Diagnosis not present

## 2022-05-04 NOTE — Progress Notes (Signed)
Patient is a pleasant 67 year old female with PMH of left cheek sebaceous cyst and left lateral canthus skin tag s/p excision performed in office on 04/28/2022 by Dr. Lovena Le who presents to clinic for postprocedural follow-up.  Reviewed procedure note and the excision site was closed with deep 4-0 Monocryl followed by interrupted 5-0 Prolene sutures.  The skin tag was simply shaved, no closure needed.  Reviewed pathology and unfortunately it is still active and has not yet resulted.    Today, patient is doing well.  No complaints.  She is pleased that the cyst has been removed.  She feels prepared for suture removal.  She had applied some Neosporin periodically since her excision.  On exam, skin edges appear well-approximated.  A total of 3 simple interrupted Prolene sutures were removed without complication or difficulty.  There is a bit of firmness at the site of excision along with localized inflammation, but no obvious subcutaneous fluid collections appreciated.  No induration or erythema.  No cellulitic changes.  Skin tag excision site also appears to be well-healed.  Recommending once daily Vaseline to the excision site x 7 days.  After that time, she can begin regular mechanical massage of the excision site 2-3 times per day.  This will hopefully soften the area and mitigate the risk of scar tissue formation.  Additionally, she can at that time transition to a silicone scar gel x 2 to 3 months.  No specific follow-up needed.  Informed patient that if she does not see any considerable improvement in her localized swelling, she can follow-up with our office.

## 2022-05-11 DIAGNOSIS — M9902 Segmental and somatic dysfunction of thoracic region: Secondary | ICD-10-CM | POA: Diagnosis not present

## 2022-05-11 DIAGNOSIS — M5431 Sciatica, right side: Secondary | ICD-10-CM | POA: Diagnosis not present

## 2022-05-11 DIAGNOSIS — M5134 Other intervertebral disc degeneration, thoracic region: Secondary | ICD-10-CM | POA: Diagnosis not present

## 2022-05-11 DIAGNOSIS — M9903 Segmental and somatic dysfunction of lumbar region: Secondary | ICD-10-CM | POA: Diagnosis not present

## 2022-05-11 DIAGNOSIS — M5136 Other intervertebral disc degeneration, lumbar region: Secondary | ICD-10-CM | POA: Diagnosis not present

## 2022-05-11 DIAGNOSIS — M9905 Segmental and somatic dysfunction of pelvic region: Secondary | ICD-10-CM | POA: Diagnosis not present

## 2022-09-09 ENCOUNTER — Other Ambulatory Visit: Payer: Self-pay | Admitting: Internal Medicine

## 2022-10-04 ENCOUNTER — Emergency Department (HOSPITAL_BASED_OUTPATIENT_CLINIC_OR_DEPARTMENT_OTHER): Payer: Medicare HMO

## 2022-10-04 ENCOUNTER — Emergency Department (HOSPITAL_BASED_OUTPATIENT_CLINIC_OR_DEPARTMENT_OTHER)
Admission: EM | Admit: 2022-10-04 | Discharge: 2022-10-04 | Disposition: A | Discharge status: Home / Self Care | Payer: Medicare HMO | Attending: Emergency Medicine | Admitting: Emergency Medicine

## 2022-10-04 ENCOUNTER — Encounter (HOSPITAL_BASED_OUTPATIENT_CLINIC_OR_DEPARTMENT_OTHER): Payer: Self-pay

## 2022-10-04 ENCOUNTER — Other Ambulatory Visit: Payer: Self-pay

## 2022-10-04 DIAGNOSIS — S199XXA Unspecified injury of neck, initial encounter: Secondary | ICD-10-CM | POA: Diagnosis not present

## 2022-10-04 DIAGNOSIS — I672 Cerebral atherosclerosis: Secondary | ICD-10-CM | POA: Diagnosis not present

## 2022-10-04 DIAGNOSIS — I6523 Occlusion and stenosis of bilateral carotid arteries: Secondary | ICD-10-CM | POA: Diagnosis not present

## 2022-10-04 DIAGNOSIS — M542 Cervicalgia: Secondary | ICD-10-CM | POA: Insufficient documentation

## 2022-10-04 DIAGNOSIS — I700 Atherosclerosis of aorta: Secondary | ICD-10-CM | POA: Diagnosis not present

## 2022-10-04 DIAGNOSIS — Z7982 Long term (current) use of aspirin: Secondary | ICD-10-CM | POA: Insufficient documentation

## 2022-10-04 DIAGNOSIS — Y92410 Unspecified street and highway as the place of occurrence of the external cause: Secondary | ICD-10-CM | POA: Insufficient documentation

## 2022-10-04 DIAGNOSIS — S0990XA Unspecified injury of head, initial encounter: Secondary | ICD-10-CM | POA: Diagnosis not present

## 2022-10-04 MED ORDER — LIDOCAINE 5 % EX PTCH
1.0000 | MEDICATED_PATCH | CUTANEOUS | Status: DC
  Administered 2022-10-04: 1 via TRANSDERMAL
  Filled 2022-10-04: qty 1

## 2022-10-04 MED ORDER — ACETAMINOPHEN 325 MG PO TABS
650.0000 mg | ORAL_TABLET | Freq: Once | ORAL | Status: AC
  Administered 2022-10-04: 650 mg via ORAL
  Filled 2022-10-04: qty 2
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
## 2022-10-04 NOTE — ED Notes (Signed)
D/c paperwork reviewed with pt, including follow up care.  All questions and/or concerns addressed at time of d/c.  No further needs expressed. . Pt verbalized understanding, Ambulatory with family to ED exit, NAD.   
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
## 2022-10-04 NOTE — Discharge Instructions (Signed)
Your CT scans today were reassuring.  I recommend you follow-up with your primary care doctor.  He can take Tylenol, Motrin for pain.  If you develop severe pain or any other new concerning symptoms you should return to the ED.
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
## 2022-10-11 ENCOUNTER — Other Ambulatory Visit: Payer: Self-pay | Admitting: Internal Medicine

## 2022-10-11 DIAGNOSIS — Z01419 Encounter for gynecological examination (general) (routine) without abnormal findings: Secondary | ICD-10-CM | POA: Diagnosis not present

## 2022-10-11 DIAGNOSIS — Z6823 Body mass index (BMI) 23.0-23.9, adult: Secondary | ICD-10-CM | POA: Diagnosis not present

## 2022-10-11 DIAGNOSIS — F331 Major depressive disorder, recurrent, moderate: Secondary | ICD-10-CM

## 2022-10-11 DIAGNOSIS — F333 Major depressive disorder, recurrent, severe with psychotic symptoms: Secondary | ICD-10-CM

## 2022-10-15 ENCOUNTER — Other Ambulatory Visit: Payer: Self-pay | Admitting: Internal Medicine

## 2022-11-09 ENCOUNTER — Other Ambulatory Visit: Payer: Self-pay | Admitting: Internal Medicine

## 2022-11-09 DIAGNOSIS — F331 Major depressive disorder, recurrent, moderate: Secondary | ICD-10-CM

## 2022-12-20 ENCOUNTER — Other Ambulatory Visit: Payer: Self-pay | Admitting: Internal Medicine

## 2022-12-20 DIAGNOSIS — F333 Major depressive disorder, recurrent, severe with psychotic symptoms: Secondary | ICD-10-CM

## 2022-12-20 DIAGNOSIS — F331 Major depressive disorder, recurrent, moderate: Secondary | ICD-10-CM

## 2022-12-24 ENCOUNTER — Other Ambulatory Visit: Payer: Self-pay | Admitting: Internal Medicine

## 2023-01-12 ENCOUNTER — Other Ambulatory Visit: Payer: Self-pay | Admitting: Internal Medicine

## 2023-01-15 ENCOUNTER — Other Ambulatory Visit: Payer: Self-pay | Admitting: Internal Medicine

## 2023-01-15 DIAGNOSIS — F333 Major depressive disorder, recurrent, severe with psychotic symptoms: Secondary | ICD-10-CM

## 2023-01-15 DIAGNOSIS — F331 Major depressive disorder, recurrent, moderate: Secondary | ICD-10-CM

## 2023-01-17 ENCOUNTER — Other Ambulatory Visit: Payer: Self-pay | Admitting: Internal Medicine

## 2023-01-21 ENCOUNTER — Other Ambulatory Visit: Payer: Self-pay | Admitting: Internal Medicine

## 2023-01-25 ENCOUNTER — Emergency Department (HOSPITAL_BASED_OUTPATIENT_CLINIC_OR_DEPARTMENT_OTHER): Payer: Medicare HMO | Admitting: Radiology

## 2023-01-25 ENCOUNTER — Other Ambulatory Visit: Payer: Self-pay

## 2023-01-25 ENCOUNTER — Emergency Department (HOSPITAL_BASED_OUTPATIENT_CLINIC_OR_DEPARTMENT_OTHER)
Admission: EM | Admit: 2023-01-25 | Discharge: 2023-01-25 | Disposition: A | Discharge status: Home / Self Care | Payer: Medicare HMO | Attending: Emergency Medicine | Admitting: Emergency Medicine

## 2023-01-25 DIAGNOSIS — I10 Essential (primary) hypertension: Secondary | ICD-10-CM | POA: Diagnosis not present

## 2023-01-25 DIAGNOSIS — R0602 Shortness of breath: Secondary | ICD-10-CM | POA: Diagnosis not present

## 2023-01-25 DIAGNOSIS — Z720 Tobacco use: Secondary | ICD-10-CM | POA: Diagnosis not present

## 2023-01-25 DIAGNOSIS — J9611 Chronic respiratory failure with hypoxia: Secondary | ICD-10-CM | POA: Insufficient documentation

## 2023-01-25 DIAGNOSIS — Z7982 Long term (current) use of aspirin: Secondary | ICD-10-CM | POA: Diagnosis not present

## 2023-01-25 DIAGNOSIS — J441 Chronic obstructive pulmonary disease with (acute) exacerbation: Secondary | ICD-10-CM | POA: Insufficient documentation

## 2023-01-25 DIAGNOSIS — J439 Emphysema, unspecified: Secondary | ICD-10-CM | POA: Insufficient documentation

## 2023-01-25 DIAGNOSIS — I700 Atherosclerosis of aorta: Secondary | ICD-10-CM | POA: Diagnosis not present

## 2023-01-25 DIAGNOSIS — Z20822 Contact with and (suspected) exposure to covid-19: Secondary | ICD-10-CM | POA: Insufficient documentation

## 2023-01-25 DIAGNOSIS — J9621 Acute and chronic respiratory failure with hypoxia: Secondary | ICD-10-CM | POA: Diagnosis not present

## 2023-01-25 LAB — BRAIN NATRIURETIC PEPTIDE: B Natriuretic Peptide: 58.9 pg/mL (ref 0.0–100.0)

## 2023-01-25 LAB — CBC
HCT: 36.7 % (ref 36.0–46.0)
Hemoglobin: 12.2 g/dL (ref 12.0–15.0)
MCH: 31 pg (ref 26.0–34.0)
MCHC: 33.2 g/dL (ref 30.0–36.0)
MCV: 93.4 fL (ref 80.0–100.0)
Platelets: 154 10*3/uL (ref 150–400)
RBC: 3.93 MIL/uL (ref 3.87–5.11)
RDW: 12.3 % (ref 11.5–15.5)
WBC: 3.7 10*3/uL — ABNORMAL LOW (ref 4.0–10.5)
nRBC: 0 % (ref 0.0–0.2)

## 2023-01-25 LAB — RESP PANEL BY RT-PCR (RSV, FLU A&B, COVID)  RVPGX2
Influenza A by PCR: NEGATIVE
Influenza B by PCR: NEGATIVE
Resp Syncytial Virus by PCR: NEGATIVE
SARS Coronavirus 2 by RT PCR: NEGATIVE

## 2023-01-25 LAB — BASIC METABOLIC PANEL
Anion gap: 7 (ref 5–15)
BUN: 13 mg/dL (ref 8–23)
CO2: 32 mmol/L (ref 22–32)
Calcium: 9.2 mg/dL (ref 8.9–10.3)
Chloride: 99 mmol/L (ref 98–111)
Creatinine, Ser: 0.59 mg/dL (ref 0.44–1.00)
GFR, Estimated: >60 mL/min (ref >60)
Glucose, Bld: 138 mg/dL — ABNORMAL HIGH (ref 70–99)
Potassium: 3.9 mmol/L (ref 3.5–5.1)
Sodium: 138 mmol/L (ref 135–145)

## 2023-01-25 LAB — TROPONIN I (HIGH SENSITIVITY): Troponin I (High Sensitivity): 10 ng/L (ref <18)

## 2023-01-25 LAB — MAGNESIUM: Magnesium: 1.5 mg/dL — ABNORMAL LOW (ref 1.7–2.4)

## 2023-01-25 MED ORDER — ALBUTEROL SULFATE HFA 108 (90 BASE) MCG/ACT IN AERS
2.0000 | INHALATION_SPRAY | Freq: Once | RESPIRATORY_TRACT | Status: AC
  Administered 2023-01-25: 2 via RESPIRATORY_TRACT
  Filled 2023-01-25: qty 6.7

## 2023-01-25 MED ORDER — PREDNISONE 10 MG PO TABS: 50.0000 mg | ORAL_TABLET | Freq: Every day | ORAL | 0 refills | Status: DC

## 2023-01-25 MED ORDER — DEXAMETHASONE SODIUM PHOSPHATE 10 MG/ML IJ SOLN
10.0000 mg | Freq: Once | INTRAMUSCULAR | Status: AC
  Administered 2023-01-25: 10 mg via INTRAMUSCULAR
  Filled 2023-01-25: qty 1

## 2023-01-25 MED ORDER — AEROCHAMBER PLUS FLO-VU MISC
1.0000 | Freq: Once | Status: DC
  Filled 2023-01-25: qty 1

## 2023-01-25 NOTE — ED Notes (Signed)
Pt VS prior to ambulation per MD order. Pt respiratory status stable on RA w/no distress and minimal SOB. Pt states this is normal for her.    01/25/23 2031  Vitals  Pulse Rate 85  Pulse Rate Source Monitor  ECG Heart Rate 83  Resp 20  BP (!) 148/101  SpO2 94 %  Oxygen Therapy  O2 Device Room Air  Patient Activity (if Appropriate) In bed  Pulse Oximetry Type Continuous

## 2023-01-25 NOTE — ED Triage Notes (Signed)
States shob starting today. Worse when laying down. Audible wheezing in triage. Endorses running nose as the only other symptom. Smoker. 88% on RA initially in triage.

## 2023-01-25 NOTE — Discharge Instructions (Addendum)
You are seen in the ER for shortness of breath. It appears to [place] that you have chronic lung disease that will need evaluation by a lung specialist. Start taking the prednisone as prescribed.  Use the inhaler every 4 hours for chest tightness or shortness of breath.  Return to the ER immediately be start having worsening shortness of breath, chest pain, dizziness, near fainting. You might start thinking about quitting tobacco use.

## 2023-01-25 NOTE — ED Notes (Signed)
Pt VS stable on RA post ambulation. Pt sats prior to ambulation 92% on RA post ambulation 88% with less than 2 minutes to recovery to 94%. Pt respiratory status stable w/minimal increase in SOB w/ambulation. MD notified of results.     01/25/23 2043  Vitals  Pulse Rate 76  Pulse Rate Source Monitor  ECG Heart Rate 75  Resp (!) 22  SpO2 94 %

## 2023-01-25 NOTE — ED Notes (Signed)
RT educated pt on proper use of IS. Pt goal set to 1250 mLs and pt able to obtain 1000 mL with good effort. Pt able to perform w/out difficulty and verbalizes understanding.     01/25/23 2120  Incentive Spirometry Tx  Level of Service Assisted by RCP  Frequency q1hr W/A  Treatment Tolerance Tolerated well  IS Goal (mL) (RN or RT) 1250 mL  IS - Achieved (mL) (RN, NT, or RT) 1000 mL

## 2023-01-25 NOTE — ED Notes (Signed)
RT assessed pt in triage for SOB. Pt BLBS clr/clr dim  upon auscultation. Pt respiratory status stable w/no distress noted at this time. Pt states she is a regular smoker and has trouble with her breathing on a daily basis. She stated to this RT that she has never been seen or tested for asthma/COPD. Rt educated pt on smoking cessation at t his time and pt verbalizes understanding of teaching.    01/25/23 1634  Therapy Vitals  Temp 98.8 F (37.1 C)  Pulse Rate 71  Resp (!) 22  BP (!) 152/71  Patient Position (if appropriate) Sitting  MEWS Score/Color  MEWS Score 1  MEWS Score Color Green  Respiratory Assessment  Assessment Type Assess only  Respiratory Pattern Regular;Unlabored;Symmetrical;Dyspnea with exertion  Chest Assessment Chest expansion symmetrical  Cough Strong;Congested;Non-productive  Bilateral Breath Sounds Clear;Diminished  R Upper  Breath Sounds Clear  L Upper Breath Sounds Clear  R Lower Breath Sounds Clear;Diminished  L Lower Breath Sounds Clear;Diminished  Oxygen Therapy/Pulse Ox  O2 Device Room Air  O2 Therapy Room air  SpO2 94 %

## 2023-01-25 NOTE — ED Provider Notes (Signed)
[place] [facility] AT [facility] Provider Note   CSN: [id number] Arrival date & time: 01/25/23  1625     History  Chief Complaint  Patient presents with   Shortness of Breath    Anna Lyons is a 67 y.o. female.  HPI    66 year old female with history of tobacco use disorder comes in with chief complaint of shortness of breath.  Patient has known history of hypertension.  She admits to heavy smoking daily since she was a teenager.  She has not been diagnosed with CAD or CHF or lung disease yet.  Patient started having shortness of breath yesterday along with URI-like symptoms.  She has congestion, runny nose and a cough that is producing clear phlegm.  Patient notes chest tightness and shortness of breath when she lays flat.    Home Medications Prior to Admission medications   Medication Sig Start Date End Date Taking? Authorizing Provider  predniSONE (DELTASONE) 10 MG tablet Take 5 tablets (50 mg total) by mouth daily. 01/25/23  Yes Derwood Kaplan, MD  albuterol (PROVENTIL HFA;VENTOLIN HFA) 108 (90 Base) MCG/ACT inhaler Inhale 2 puffs into the lungs every 6 (six) hours as needed for wheezing or shortness of breath. 12/28/16   Myrlene Broker, MD  ARIPiprazole (ABILIFY) 2 MG tablet Take 1 tablet by mouth once daily 01/24/23   Myrlene Broker, MD  aspirin EC 81 MG tablet Take 81 mg by mouth daily.    [provider]  atorvastatin (LIPITOR) 80 MG tablet TAKE 1 TABLET BY MOUTH AT BEDTIME 01/24/23   Myrlene Broker, MD  cholecalciferol (VITAMIN D3) 25 MCG (1000 UNIT) tablet Take 1,000 Units by mouth daily.    [provider]  DULoxetine (CYMBALTA) 60 MG capsule TAKE 1 CAPSULE BY MOUTH DAILY 11/09/22   Myrlene Broker, MD  ferrous sulfate 324 MG TBEC Take 324 mg by mouth.    [provider]  gabapentin (NEURONTIN) 300 MG capsule Take 1 capsule (300 mg total) by mouth 2 (two) times daily. 02/23/22   Myrlene Broker, MD  levETIRAcetam (KEPPRA) 500 MG tablet Take 1 tablet by mouth twice daily 01/24/23   Myrlene Broker, MD  Magnesium 250 MG TABS Take by mouth.    [provider]  Multiple Vitamin (MULTIVITAMIN WITH MINERALS) TABS tablet Take 1 tablet by mouth daily.    [provider]  pramipexole (MIRAPEX) 0.5 MG tablet TAKE ONE TABLET BY MOUTH AT BEDTIME AS NEEDED 04/21/22   Myrlene Broker, MD  temazepam (RESTORIL) 15 MG capsule TAKE 1 CAPSULE BY MOUTH AT BEDTIME 01/24/23   Corwin Levins, MD  vitamin B-12 (CYANOCOBALAMIN) 1000 MCG tablet Take 1,000 mcg by mouth daily.    [provider]      Allergies    Codeine and Oxycodone-acetaminophen    Review of Systems   Review of Systems  All other systems reviewed and are negative.   Physical Exam Updated Vital Signs BP (!) 148/101   Pulse 76   Temp 98.8 F (37.1 C) (Oral)   Resp (!) 22   Ht 5\' 3"  (1.6 m)   Wt 58.5 kg   SpO2 94%   BMI 22.85 kg/m  Physical Exam Vitals and nursing note reviewed.  Constitutional:      Appearance: She is well-developed.  HENT:     Head: Atraumatic.  Neck:     Vascular: No JVD.  Cardiovascular:     Rate and Rhythm: Normal rate.  Pulmonary:     Effort: Pulmonary effort is normal.     Breath sounds: Decreased breath sounds present. No wheezing or rales.  Musculoskeletal:     Cervical back: Normal range of motion and neck supple.  Skin:    General: Skin is warm and dry.  Neurological:     Mental Status: She is alert and oriented to person, place, and time.     ED Results / Procedures / Treatments   Labs (all labs ordered are listed, but only abnormal results are displayed) Labs Reviewed  BASIC METABOLIC PANEL - Abnormal; Notable for the following components:      Result Value   Glucose, Bld 138 (*)    All other components within normal limits  MAGNESIUM - Abnormal; Notable for the following components:   Magnesium 1.5 (*)    All other components  within normal limits  CBC - Abnormal; Notable for the following components:   WBC 3.7 (*)    All other components within normal limits  RESP PANEL BY RT-PCR (RSV, FLU A&B, COVID)  [id number]  BRAIN NATRIURETIC PEPTIDE  TROPONIN I (HIGH SENSITIVITY)  TROPONIN I (HIGH SENSITIVITY)    EKG EKG Interpretation Date/Time:  Tuesday January 25 2023 16:34:51 EST Ventricular Rate:  71 PR Interval:  158 QRS Duration:  76 QT Interval:  418 QTC Calculation: 454 R Axis:   -76  Text Interpretation: Normal sinus rhythm Left anterior fascicular block Abnormal ECG When compared with ECG of 30-Nov-2018 17:32, No significant change was found Confirmed by Derwood Kaplan [phone]) on 01/25/2023 6:20:43 PM  Radiology DG Chest 2 View Result Date: 01/25/2023 CLINICAL DATA:  Shortness of breath.  Chest EXAM: CHEST - 2 VIEW COMPARISON:  Dated 07/19/2018. FINDINGS: Background of emphysema. No focal consolidation, pleural effusion, or pneumothorax. The cardiac silhouette is within limits. Atherosclerotic calcification of the aorta. Osteopenia with degenerative changes. No acute osseous pathology. IMPRESSION: 1. No active cardiopulmonary disease. 2. Emphysema. Electronically Signed   By: Elgie Collard M.D.   On: 01/25/2023 17:56    Procedures Procedures    Medications Ordered in ED Medications  Aerochamber Plus device 1 each (has no administration in time range)  dexamethasone (DECADRON) injection 10 mg (has no administration in time range)  albuterol (VENTOLIN HFA) 108 (90 Base) MCG/ACT inhaler 2 puff (2 puffs Inhalation Given 01/25/23 2029)    ED Course/ Medical Decision Making/ A&P                                 Medical Decision Making Amount and/or Complexity of Data Reviewed Labs: ordered. Radiology: ordered.  Risk Prescription drug management.   This patient presents to the ED with chief complaint(s) of shortness of breath with pertinent past medical history of hypertension,  hyperlipidemia, tobacco use disorder, ?  PAD.The complaint involves an extensive differential diagnosis and also carries with it a high risk of complications and morbidity.    The differential diagnosis includes : New diagnosis of emphysema, COPD, CHF, influenza, acute coronary syndrome, valve disorder, PE, pulmonary mass, pleural effusion.  The initial plan is to get basic labs, flu panel and x-ray of the chest.  Additional history obtained: Additional history obtained from family  Independent labs interpretation:  The following labs were independently interpreted:    Independent visualization and interpretation of imaging: - I independently visualized the following imaging with scope of interpretation limited to determining acute life threatening conditions related to emergency  care: X-ray of the chest, which revealed no evidence of pneumonia, pneumothorax, pleural effusion  Treatment and Reassessment: Patient reassessed.  She feels slightly better after she received inhaler treatment.  She was ambulated, O2 sats did drop into upper 88%, but rebounded to 92% within couple of minutes.  She at no point did have severe shortness of breath.  I suspect that she is having chronic respiratory failure with hypoxia because of her undiagnosed emphysema-COPD.  Probably patient needs to start seeing pulmonologist.  Smoking cessation discussed. Advised that patient follow-up with pulmonary service.  We have given [place] pulmonary information.  She will also advise her PCP to give consultation if needed.  Consideration for admission or further workup: Admission considered, but appears that most likely patient has chronic respiratory failure not acute respiratory failure.  Social Determinants of health:   Final Clinical Impression(s) / ED Diagnoses Final diagnoses:  Pulmonary emphysema, unspecified emphysema type (HCC)  COPD with acute exacerbation (HCC)  Chronic respiratory failure with hypoxia  (HCC)    Rx / DC Orders ED Discharge Orders          Ordered    predniSONE (DELTASONE) 10 MG tablet  Daily        01/25/23 2118              Derwood Kaplan, MD 01/25/23 2121

## 2023-01-28 ENCOUNTER — Telehealth: Payer: Self-pay

## 2023-01-28 NOTE — Transitions of Care (Post Inpatient/ED Visit) (Signed)
 01/28/2023  Name: Anna Lyons MRN: [id number] DOB: 05/31/1955  Today's TOC FU Call Status: Today's TOC FU Call Status:: Successful TOC FU Call Completed TOC FU Call Complete Date: 01/28/23 Patient's Name and Date of Birth confirmed.  Transition Care Management Follow-up Telephone Call Date of Discharge: 01/25/23 Discharge Facility: [facility] ([facility]) Type of Discharge: Emergency Department Reason for ED Visit: Respiratory Respiratory Diagnosis: COPD Exacerbation How have you been since you were released from the hospital?: Better Any questions or concerns?: No  Items Reviewed: Did you receive and understand the discharge instructions provided?: Yes Medications obtained,verified, and reconciled?: Yes (Medications Reviewed) Any new allergies since your discharge?: No Dietary orders reviewed?: NA Do you have support at home?: No (states she has neighbors that are good)  Medications Reviewed Today: Medications Reviewed Today     Reviewed by Stephinie Battisti, Jordan Hawks, CMA (Certified Medical Assistant) on 01/28/23 at 1556  Med List Status: <None>   Medication Order Taking? Sig Documenting Provider Last Dose Status Informant  albuterol (PROVENTIL HFA;VENTOLIN HFA) 108 (90 Base) MCG/ACT inhaler [id number] No Inhale 2 puffs into the lungs every 6 (six) hours as needed for wheezing or shortness of breath. Myrlene Broker, MD Taking Active   ARIPiprazole (ABILIFY) 2 MG tablet [id number]  Take 1 tablet by mouth once daily Myrlene Broker, MD  Active   aspirin EC 81 MG tablet [id number] No Take 81 mg by mouth daily. [provider] Taking Active Self  atorvastatin (LIPITOR) 80 MG tablet [id number]  TAKE 1 TABLET BY MOUTH AT BEDTIME Myrlene Broker, MD  Active   cholecalciferol (VITAMIN D3) 25 MCG (1000 UNIT) tablet [id number] No Take 1,000 Units by mouth daily. [provider] Taking Active   DULoxetine (CYMBALTA) 60 MG capsule [id number]  TAKE 1 CAPSULE BY  MOUTH DAILY Myrlene Broker, MD  Active   ferrous sulfate 324 MG TBEC [id number] No Take 324 mg by mouth. [provider] Taking Active   gabapentin (NEURONTIN) 300 MG capsule [id number] No Take 1 capsule (300 mg total) by mouth 2 (two) times daily. Myrlene Broker, MD Taking Active   levETIRAcetam (KEPPRA) 500 MG tablet [id number]  Take 1 tablet by mouth twice daily Myrlene Broker, MD  Active   Magnesium 250 MG TABS [id number] No Take by mouth. [provider] Taking Active   Multiple Vitamin (MULTIVITAMIN WITH MINERALS) TABS tablet [id number] No Take 1 tablet by mouth daily. [provider] Taking Active Self  pramipexole (MIRAPEX) 0.5 MG tablet [id number]  TAKE ONE TABLET BY MOUTH AT BEDTIME AS NEEDED Myrlene Broker, MD  Active   predniSONE (DELTASONE) 10 MG tablet [id number]  Take 5 tablets (50 mg total) by mouth daily. Derwood Kaplan, MD  Active   temazepam (RESTORIL) 15 MG capsule [id number]  TAKE 1 CAPSULE BY MOUTH AT BEDTIME Corwin Levins, MD  Active   vitamin B-12 (CYANOCOBALAMIN) 1000 MCG tablet [id number] No Take 1,000 mcg by mouth daily. [provider] Taking Active             Home Care and Equipment/Supplies: Were Home Health Services Ordered?: NA Any new equipment or medical supplies ordered?: NA  Functional Questionnaire: Do you need assistance with bathing/showering or dressing?: No Do you need assistance with meal preparation?: No Do you need assistance with eating?: No Do you have difficulty maintaining continence: No Do you need assistance with getting out of bed/getting out of a chair/moving?: No Do you have difficulty managing or taking your medications?:  No  Follow up appointments reviewed: PCP Follow-up appointment confirmed?: Yes Date of PCP follow-up appointment?: 02/04/23 Follow-up Provider: Nanda Quinton, MD [facility] Follow-up appointment confirmed?: NA Do you need transportation to your  follow-up appointment?: No Do you understand care options if your condition(s) worsen?: Yes-patient verbalized understanding    Sadako Cegielski, CMA  [facility] AWV Team Direct Dial: [phone]

## 2023-02-04 ENCOUNTER — Ambulatory Visit: Payer: Medicare HMO

## 2023-02-14 ENCOUNTER — Ambulatory Visit (INDEPENDENT_AMBULATORY_CARE_PROVIDER_SITE_OTHER): Payer: PPO | Admitting: Internal Medicine

## 2023-02-14 ENCOUNTER — Encounter: Payer: Self-pay | Admitting: Internal Medicine

## 2023-02-14 VITALS — BP 112/82 | HR 74 | Temp 98.3°F | Ht 63.0 in | Wt 127.0 lb

## 2023-02-14 DIAGNOSIS — I10 Essential (primary) hypertension: Secondary | ICD-10-CM | POA: Diagnosis not present

## 2023-02-14 DIAGNOSIS — J439 Emphysema, unspecified: Secondary | ICD-10-CM

## 2023-02-14 DIAGNOSIS — Z720 Tobacco use: Secondary | ICD-10-CM | POA: Diagnosis not present

## 2023-02-14 DIAGNOSIS — F331 Major depressive disorder, recurrent, moderate: Secondary | ICD-10-CM

## 2023-02-14 MED ORDER — DULOXETINE HCL 60 MG PO CPEP: 120.0000 mg | ORAL_CAPSULE | Freq: Every day | ORAL | 3 refills | Status: DC

## 2023-02-14 MED ORDER — VARENICLINE TARTRATE 1 MG PO TABS: 1.0000 mg | ORAL_TABLET | Freq: Two times a day (BID) | ORAL | 5 refills | Status: DC

## 2023-02-14 MED ORDER — VARENICLINE TARTRATE (STARTER) 0.5 MG X 11 & 1 MG X 42 PO TBPK: ORAL_TABLET | ORAL | 0 refills | Status: DC

## 2023-02-14 NOTE — Patient Instructions (Signed)
 We will do the lung function test to check for COPD/emphysema.   Work on stopping smoking we have sent in chantix to start to help  We will increase cymbalta (duloxetine) to 2 pills daily.

## 2023-02-14 NOTE — Progress Notes (Signed)
   Subjective:   Patient ID: Anna Lyons, female    DOB: 08/14/1955, 67 y.o.   MRN: [id number]  HPI The patient is a 68 YO female coming in for concerns about possible emphysema/copd and depression which is worsening and memory issues as well.   Review of Systems  Constitutional:  Positive for activity change and fatigue.  HENT: Negative.    Eyes: Negative.   Respiratory:  Positive for cough and shortness of breath. Negative for chest tightness.   Cardiovascular:  Negative for chest pain, palpitations and leg swelling.  Gastrointestinal:  Negative for abdominal distention, abdominal pain, constipation, diarrhea, nausea and vomiting.  Musculoskeletal: Negative.   Skin: Negative.   Neurological: Negative.        Memory issues  Psychiatric/Behavioral:  Positive for decreased concentration and dysphoric mood.     Objective:  Physical Exam Constitutional:      Appearance: She is well-developed.  HENT:     Head: Normocephalic and atraumatic.  Cardiovascular:     Rate and Rhythm: Normal rate and regular rhythm.  Pulmonary:     Effort: Pulmonary effort is normal. No respiratory distress.     Breath sounds: Normal breath sounds. No wheezing or rales.  Abdominal:     General: Bowel sounds are normal. There is no distension.     Palpations: Abdomen is soft.     Tenderness: There is no abdominal tenderness. There is no rebound.  Musculoskeletal:     Cervical back: Normal range of motion.  Skin:    General: Skin is warm and dry.  Neurological:     Mental Status: She is alert and oriented to person, place, and time.     Coordination: Coordination normal.     Vitals:   02/14/23 1500  BP: 112/82  Pulse: 74  Temp: 98.3 F (36.8 C)  TempSrc: Oral  SpO2: 96%  Weight: 127 lb (57.6 kg)  Height: 5' 3 (1.6 m)    Assessment & Plan:

## 2023-02-16 DIAGNOSIS — J439 Emphysema, unspecified: Secondary | ICD-10-CM | POA: Insufficient documentation

## 2023-02-16 NOTE — Assessment & Plan Note (Signed)
 She is not coping as well lately. She is taking cymbalta  60 mg daily and we will increase this to 120 mg daily and return in 1 month to assess efficacy.

## 2023-02-16 NOTE — Assessment & Plan Note (Signed)
 BP at goal off meds and will continue to monitor closely given past stroke.

## 2023-02-16 NOTE — Assessment & Plan Note (Signed)
 She may want to make quit attempt. Rx chantix  and discussed need to set quit date in first month, take chantix  for 3-6 months once quit.

## 2023-02-16 NOTE — Assessment & Plan Note (Signed)
 Presumptive diagnosis based on smoking history and CXR. Ordered PFTs to assess and confirm/deny. She is using albuterol  in the past. She is having mild SOB and some cough. We discussed smoking cessation and have prescribed chantix  to help with this.

## 2023-02-18 ENCOUNTER — Encounter: Payer: Self-pay | Admitting: Acute Care

## 2023-02-19 ENCOUNTER — Other Ambulatory Visit: Payer: Self-pay | Admitting: Internal Medicine

## 2023-02-19 DIAGNOSIS — F331 Major depressive disorder, recurrent, moderate: Secondary | ICD-10-CM

## 2023-02-19 DIAGNOSIS — F333 Major depressive disorder, recurrent, severe with psychotic symptoms: Secondary | ICD-10-CM

## 2023-02-21 ENCOUNTER — Ambulatory Visit (INDEPENDENT_AMBULATORY_CARE_PROVIDER_SITE_OTHER): Payer: PPO

## 2023-02-21 DIAGNOSIS — Z0000 Encounter for general adult medical examination without abnormal findings: Secondary | ICD-10-CM | POA: Diagnosis not present

## 2023-02-21 DIAGNOSIS — Z1212 Encounter for screening for malignant neoplasm of rectum: Secondary | ICD-10-CM

## 2023-02-21 DIAGNOSIS — Z1211 Encounter for screening for malignant neoplasm of colon: Secondary | ICD-10-CM

## 2023-02-21 NOTE — Progress Notes (Signed)
Subjective:   Anna Lyons is a 68 y.o. female who presents for Medicare Annual (Subsequent) preventive examination.  Visit Complete: In person  Cardiac Risk Factors include: advanced age (>10men, >30 women);dyslipidemia     Objective:    There were no vitals filed for this visit. There is no height or weight on file to calculate BMI.     02/21/2023    3:52 PM 01/25/2023    4:34 PM 02/20/2021    1:07 PM 02/08/2020   11:09 AM 11/30/2018    5:33 PM 10/12/2018    2:00 AM 10/11/2018    4:19 PM  Advanced Directives  Does Patient Have a Medical Advance Directive? Yes No Yes No No Yes No  Type of Estate agent of [place];Living will  Living will;Healthcare Power of [employer] Power of Attorney   Does patient want to make changes to medical advance directive?   No - Patient declined   No - Patient declined   Copy of Healthcare Power of Attorney in Chart? No - copy requested  No - copy requested   No - copy requested   Would patient like information on creating a medical advance directive?    No - Patient declined No - Patient declined No - Patient declined No - Patient declined    Current Medications (verified) Outpatient Encounter Medications as of 02/21/2023  Medication Sig   albuterol (PROVENTIL HFA;VENTOLIN HFA) 108 (90 Base) MCG/ACT inhaler Inhale 2 puffs into the lungs every 6 (six) hours as needed for wheezing or shortness of breath.   ARIPiprazole (ABILIFY) 2 MG tablet Take 1 tablet by mouth once daily   aspirin EC 81 MG tablet Take 81 mg by mouth daily.   atorvastatin (LIPITOR) 80 MG tablet TAKE 1 TABLET BY MOUTH AT BEDTIME   cholecalciferol (VITAMIN D3) 25 MCG (1000 UNIT) tablet Take 1,000 Units by mouth daily.   DULoxetine (CYMBALTA) 60 MG capsule Take 2 capsules (120 mg total) by mouth daily.   gabapentin (NEURONTIN) 300 MG capsule Take 1 capsule (300 mg total) by mouth 2 (two) times daily.   levETIRAcetam (KEPPRA) 500 MG tablet Take 1 tablet  by mouth twice daily   Magnesium 250 MG TABS Take by mouth.   Multiple Vitamin (MULTIVITAMIN WITH MINERALS) TABS tablet Take 1 tablet by mouth daily.   pramipexole (MIRAPEX) 0.5 MG tablet TAKE ONE TABLET BY MOUTH AT BEDTIME AS NEEDED   temazepam (RESTORIL) 15 MG capsule TAKE 1 CAPSULE BY MOUTH AT BEDTIME   varenicline (CHANTIX CONTINUING MONTH PAK) 1 MG tablet Take 1 tablet (1 mg total) by mouth 2 (two) times daily.   Varenicline Tartrate, Starter, (CHANTIX STARTING MONTH PAK) 0.5 MG X 11 & 1 MG X 42 TBPK Follow titration pack   vitamin B-12 (CYANOCOBALAMIN) 1000 MCG tablet Take 1,000 mcg by mouth daily.   ferrous sulfate 324 MG TBEC Take 324 mg by mouth. (Patient not taking: Reported on 02/21/2023)   predniSONE (DELTASONE) 10 MG tablet Take 5 tablets (50 mg total) by mouth daily. (Patient not taking: Reported on 02/21/2023)   No facility-administered encounter medications on file as of 02/21/2023.    Allergies (verified) Codeine and Oxycodone-acetaminophen   History: Past Medical History:  Diagnosis Date   Angina    Breast cyst    Cancer (HCC)    Depression    Hypertension    RLS (restless legs syndrome) 11/16/2011   Seizures (HCC) 2016   last seizure="last year, 2016" per pt.  Stroke (HCC) 04/20/2011   a. 04/20/11 Left subcortical infarct treated w/ TPA;  b. 04/2011 Echo: EF 55-60%;  c. 04/2011 Normal Carotid u/s  d. Residual "walk w/limp on right; unable to grasp w/right hand"   TIA (transient ischemic attack) 08/2010   Tobacco abuse    a. quit @ time of CVA 04/2011.   Past Surgical History:  Procedure Laterality Date   BREAST RECONSTRUCTION     bilaterally   CARPAL TUNNEL RELEASE Left 1990's   CARPAL TUNNEL RELEASE Left 12/08/2015   CESAREAN SECTION  1979   KNEE ARTHROSCOPY Left 1990's    cartilage repair   LEFT HEART CATHETERIZATION WITH CORONARY ANGIOGRAM N/A 05/20/2011   Procedure: LEFT HEART CATHETERIZATION WITH CORONARY ANGIOGRAM;  Surgeon: Wendall Stade, MD;  Location:  [facility] CATH LAB;  Service: Cardiovascular;  Laterality: N/A;   MASTECTOMY Bilateral 1980's   bilateral with reconstruction, breast cancer on left   Family History  Problem Relation Age of Onset   Lupus Mother        died early 44's.   Emphysema Father        died late 13's.   COPD Father    Pancreatic cancer Brother    Bladder Cancer Brother    Rectal cancer Brother    Suicidality Brother    Colon cancer Neg Hx    Social History   Socioeconomic History   Marital status: Widowed    Spouse name: Not on file   Number of children: 1   Years of education: 34   Highest education level: Not on file  Occupational History   Occupation: disabled  Tobacco Use   Smoking status: Every Day    Current packs/day: 0.75    Average packs/day: 0.8 packs/day for 45.0 years (33.8 ttl pk-yrs)    Types: Cigarettes   Smokeless tobacco: Never   Tobacco comments:    Currently smoking 1/2 pack per day  Vaping Use   Vaping status: Never Used  Substance and Sexual Activity   Alcohol use: Yes    Alcohol/week: 7.0 standard drinks of alcohol    Types: 7 Glasses of wine per week    Comment: very occasional   Drug use: No   Sexual activity: Not Currently  Other Topics Concern   Not on file  Social History Narrative   Pt lives alone.   Caffeine Use: 1-3 cups of caffeine daily (tea)   Social Drivers of Health   Financial Resource Strain: Low Risk  (02/21/2023)   Overall Financial Resource Strain (CARDIA)    Difficulty of Paying Living Expenses: Not hard at all  Food Insecurity: No Food Insecurity (02/21/2023)   Hunger Vital Sign    Worried About Running Out of Food in the Last Year: Never true    Ran Out of Food in the Last Year: Never true  Transportation Needs: No Transportation Needs (02/21/2023)   PRAPARE - Administrator, Civil Service (Medical): No    Lack of Transportation (Non-Medical): No  Physical Activity: Inactive (02/21/2023)   Exercise Vital Sign    Days of Exercise per  Week: 0 days    Minutes of Exercise per Session: 0 min  Stress: Stress Concern Present (02/21/2023)   [employer] of Occupational Health - Occupational Stress Questionnaire    Feeling of Stress : Very much  Social Connections: Moderately Isolated (02/21/2023)   Social Connection and Isolation Panel [NHANES]    Frequency of Communication with Friends and Family: More than three times  a week    Frequency of Social Gatherings with Friends and Family: Once a week    Attends Religious Services: 1 to 4 times per year    Active Member of [employer] or Organizations: No    Attends Banker Meetings: Never    Marital Status: Widowed    Tobacco Counseling Ready to quit: Not Answered Counseling given: Not Answered Tobacco comments: Currently smoking 1/2 pack per day   Clinical Intake:  Pre-visit preparation completed: Yes  Pain : No/denies pain     Nutritional Risks: None Diabetes: No  How often do you need to have someone help you when you read instructions, pamphlets, or other written materials from your doctor or pharmacy?: 1 - Never  Interpreter Needed?: No  Information entered by :: Chapman Matteucci, RMA   Activities of Daily Living    02/21/2023    3:41 PM  In your present state of health, do you have any difficulty performing the following activities:  Hearing? 0  Vision? 0  Difficulty concentrating or making decisions? 1  Walking or climbing stairs? 1  Dressing or bathing? 0  Doing errands, shopping? 0  Preparing Food and eating ? N  Using the Toilet? N  In the past six months, have you accidently leaked urine? N  Do you have problems with loss of bowel control? N  Managing your Medications? N  Managing your Finances? N    Patient Care Team: Myrlene Broker, MD as PCP - General (Internal Medicine) Wynn Banker Victorino Sparrow, MD as Consulting Physician (Physical Medicine and Rehabilitation) Kathyrn Sheriff, [facility] (Inactive) as Pharmacist  (Pharmacist)  Indicate any recent Medical Services you may have received from other than [facility] providers in the past year (date may be approximate).     Assessment:   This is a routine wellness examination for [place].  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      Depression Screen    02/21/2023    3:57 PM 02/14/2023    3:14 PM 02/23/2022    2:30 PM 02/20/2021    1:10 PM 10/03/2020   11:03 AM 02/15/2020   10:10 AM 02/08/2020   11:12 AM  PHQ 2/9 Scores  PHQ - 2 Score 3 0 4 6 6 3 6   PHQ- 9 Score 12 0 13 15 17  20     Fall Risk    02/21/2023    3:53 PM 02/23/2022    2:29 PM 02/20/2021    1:08 PM 02/15/2020   10:10 AM 02/08/2020   11:09 AM  Fall Risk   Falls in the past year? 0 0 1 1 0  Number falls in past yr: 0 0 0 1 0  Injury with Fall? 0 0 0 0 0  Risk for fall due to : No Fall Risks No Fall Risks No Fall Risks  No Fall Risks;Other (Comment)  Follow up Falls prevention discussed;Falls evaluation completed Falls evaluation completed Falls evaluation completed  Falls evaluation completed;Education provided    MEDICARE RISK AT HOME: Medicare Risk at Home Any stairs in or around the home?: No Home free of loose throw rugs in walkways, pet beds, electrical cords, etc?: Yes Adequate lighting in  your home to reduce risk of falls?: Yes Life alert?: No Use of a cane, walker or w/c?: Yes Grab bars in the bathroom?: Yes Shower chair or bench in shower?: Yes Elevated toilet seat or a handicapped toilet?: Yes  TIMED UP AND GO:  Was the test performed?  Yes  Length of time to ambulate 10 feet: 15 sec Gait steady and fast without use of assistive device    Cognitive Function:    09/01/2016   11:44 AM 05/24/2014    1:53 PM  MMSE - Mini Mental State Exam   Orientation to time 5 5  Orientation to Place 5 5  Registration 3 3  Attention/ Calculation 5 5  Recall 3 3  Language- name 2 objects 2 2  Language- repeat 1 1  Language- follow 3 step command 3 3  Language- read & follow direction 1 1  Write a sentence 1 1  Copy design 1 1  Total score 30 30        02/21/2023    3:42 PM  6CIT Screen  What Year? 0 points  What month? 0 points  What time? 0 points  Count back from 20 0 points  Months in reverse 0 points  Repeat phrase 0 points  Total Score 0 points    Immunizations Immunization History  Administered Date(s) Administered   Fluad Quad(high Dose 65+) 10/08/2021   Influenza Split 11/17/2011   Influenza Whole 11/01/2013   Influenza,inj,Quad PF,6+ Mos 09/23/2015, 10/28/2016, 10/17/2017, 09/27/2018, 10/11/2019   Influenza-Unspecified 11/12/2014, 10/13/2022   PFIZER(Purple Top)SARS-COV-2 Vaccination 03/10/2019, 04/02/2019, 10/15/2021   PNEUMOCOCCAL CONJUGATE-20 10/13/2022   PPD Test 05/15/2014   Pneumococcal Conjugate-13 10/11/2019   Pneumococcal Polysaccharide-23 11/17/2011   Pneumococcal-Unspecified 10/08/2021   Tdap 12/20/2013   Unspecified SARS-COV-2 Vaccination 10/13/2022   Zoster, Live 12/13/2013    TDAP status: Up to date  Flu Vaccine status: Up to date  Pneumococcal vaccine status: Up to date  Covid-19 vaccine status: Completed vaccines  Qualifies for Shingles Vaccine? Yes   Zostavax completed Yes   Shingrix Completed?: No.    Education has been provided regarding the importance of this vaccine. Patient has been advised to call insurance company to determine out of pocket expense if they have not yet received this vaccine. Advised may also receive vaccine at local pharmacy or Health Dept. Verbalized acceptance and understanding.  Screening Tests Health Maintenance  Topic Date Due   Zoster Vaccines- Shingrix (1 of 2) 02/02/2004   Lung Cancer Screening  11/20/2020   DTaP/Tdap/Td (2 - Td or Tdap) 12/21/2023    Medicare Annual Wellness (AWV)  02/21/2024   Colonoscopy  12/17/2025   Pneumonia Vaccine 59+ Years old  Completed   INFLUENZA VACCINE  Completed   DEXA SCAN  Completed   COVID-19 Vaccine  Completed   Hepatitis C Screening  Completed   HPV VACCINES  Aged Out    Health Maintenance  Health Maintenance Due  Topic Date Due   Zoster Vaccines- Shingrix (1 of 2) 02/02/2004   Lung Cancer Screening  11/20/2020    Colorectal cancer screening: Referral to GI placed 02/21/2023. Pt aware the office will call re: appt.  Mammogram status: Ordered 02/21/23. Pt provided with contact info and advised to call to schedule appt.   Bone Density status: Completed 02/02/2018. Results reflect: Bone density results: OSTEOPENIA. Repeat every 2 years.  Lung Cancer Screening: (Low Dose CT Chest recommended if Age 105-80 years, 20 pack-year currently smoking OR have quit w/in 15years.) does qualify.  Lung Cancer Screening Referral: 02/23/22  Additional Screening:  Hepatitis C Screening: does qualify; Completed 10/11/2019  Vision Screening: Recommended annual ophthalmology exams for early detection of glaucoma and other disorders of the eye. Is the patient up to date with their annual eye exam?  No  Who is the provider or what is the name of the office in which the patient attends annual eye exams? [facility] If pt is not established with a provider, would they like to be referred to a provider to establish care? No .   Dental Screening: Recommended annual dental exams for proper oral hygiene   Community Resource Referral / Chronic Care Management: CRR required this visit?  No   CCM required this visit?  No     Plan:     I have personally reviewed and noted the following in the patient's chart:   Medical and social history Use of alcohol, tobacco or illicit drugs  Current medications and supplements including opioid prescriptions. Patient is not currently taking opioid  prescriptions. Functional ability and status Nutritional status Physical activity Advanced directives List of other physicians Hospitalizations, surgeries, and ER visits in previous 12 months Vitals Screenings to include cognitive, depression, and falls Referrals and appointments  In addition, I have reviewed and discussed with patient certain preventive protocols, quality metrics, and best practice recommendations. A written personalized care plan for preventive services as well as general preventive health recommendations were provided to patient.     Zinedine Ellner L Magdalynn Davilla, CMA   02/21/2023   After Visit Summary: (MyChart) Due to this being a telephonic visit, the after visit summary with patients personalized plan was offered to patient via MyChart   Nurse Notes: Patient is due for a colonoscopy, a mammogram, a lung cancer screening and a Shingrix vaccine.  I have placed orders today for the mammogram and colonoscopy.  The referral placed for lung screening is now closed.  Patient is requesting  a refill for albuterol today.

## 2023-02-21 NOTE — Patient Instructions (Signed)
Anna Lyons , Thank you for taking time to come for your Medicare Wellness Visit. I appreciate your ongoing commitment to your health goals. Please review the following plan we discussed and let me know if I can assist you in the future.   Referrals/Orders/Follow-Ups/Clinician Recommendations: You are due for a Mammogram, Colonoscopy and a Shingles vaccine.  Please call  [facility] at [phone] to get scheduled for a colonoscopy.  You have an order for:   [x]   3D Mammogram    Please call for appointment:  [facility] [address] [place], [place] [address] [phone]   Make sure to wear two-piece clothing.  No lotions, powders, or deodorants the day of the appointment. Make sure to bring picture ID and insurance card.  Bring list of medications you are currently taking including any supplements.   Schedule your [place] screening mammogram through MyChart!   Log into your MyChart account.  Go to 'Visit' (or 'Appointments' if on mobile App) --> Schedule an Appointment  Under 'Select a Reason for Visit' choose the Mammogram Screening option.  Complete the pre-visit questions and select the time and place that best fits your schedule.    This is a list of the screening recommended for you and due dates:  Health Maintenance  Topic Date Due   Zoster (Shingles) Vaccine (1 of 2) 02/02/2004   Screening for Lung Cancer  11/20/2020   DTaP/Tdap/Td vaccine (2 - Td or Tdap) 12/21/2023   Medicare Annual Wellness Visit  02/21/2024   Colon Cancer Screening  12/17/2025   Pneumonia Vaccine  Completed   Flu Shot  Completed   DEXA scan (bone density measurement)  Completed   COVID-19 Vaccine  Completed   Hepatitis C Screening  Completed   HPV Vaccine  Aged Out    Advanced directives: (Copy Requested) Please bring a copy of your health care power of attorney and living will to the office to be added to your chart at your  convenience.  Next Medicare Annual Wellness Visit scheduled for next year: Yes

## 2023-02-22 ENCOUNTER — Other Ambulatory Visit: Payer: Self-pay | Admitting: Internal Medicine

## 2023-02-22 DIAGNOSIS — Z1231 Encounter for screening mammogram for malignant neoplasm of breast: Secondary | ICD-10-CM

## 2023-02-22 DIAGNOSIS — Z0000 Encounter for general adult medical examination without abnormal findings: Secondary | ICD-10-CM

## 2023-03-03 ENCOUNTER — Telehealth: Payer: Self-pay

## 2023-03-03 ENCOUNTER — Other Ambulatory Visit: Payer: Self-pay | Admitting: Internal Medicine

## 2023-03-03 NOTE — Telephone Encounter (Signed)
Copied from CRM [id number]. Topic: Referral - Question >> Mar 03, 2023  2:00 PM Fredrica W wrote: Reason for CRM: Patient called states she spoke with [facility] to schedule a mammogram  and they advised that she would first need a diagnostic appointment and that PCP would need to set that up. Thank You

## 2023-03-03 NOTE — Telephone Encounter (Signed)
Copied from CRM [id number]. Topic: Clinical - Medication Refill >> Mar 03, 2023  1:58 PM Fuller Mandril wrote: Most Recent Primary Care Visit:  Provider: Wyvonne Lenz  Department: [facility]  Visit Type: MEDICARE AWV, SEQUENTIAL  Date: 02/21/2023  Medication: temazepam (RESTORIL) 15 MG capsule  Has the patient contacted their pharmacy? Yes (Agent: If no, request that the patient contact the pharmacy for the refill. If patient does not wish to contact the pharmacy document the reason why and proceed with request.) (Agent: If yes, when and what did the pharmacy advise?) No refills   Is this the correct pharmacy for this prescription? Yes If no, delete pharmacy and type the correct one.  This is the patient's preferred pharmacy:  [facility] [id number] - [place], [place] - [address] [place] [address] Phone: [phone] Fax: [phone]   Has the prescription been filled recently? 01/24/2023  Is the patient out of the medication? Yes  Has the patient been seen for an appointment in the last year OR does the patient have an upcoming appointment? Yes 02/14/2023  Can we respond through MyChart? No  Agent: Please be advised that Rx refills may take up to 3 business days. We ask that you follow-up with your pharmacy.

## 2023-03-04 NOTE — Telephone Encounter (Signed)
Is she having a symptom/problem? If so what

## 2023-03-05 ENCOUNTER — Other Ambulatory Visit: Payer: Self-pay | Admitting: Internal Medicine

## 2023-03-07 MED ORDER — TEMAZEPAM 15 MG PO CAPS: 15.0000 mg | ORAL_CAPSULE | Freq: Every day | ORAL | 0 refills | Status: DC

## 2023-03-23 ENCOUNTER — Other Ambulatory Visit: Payer: Self-pay | Admitting: Internal Medicine

## 2023-03-23 DIAGNOSIS — F331 Major depressive disorder, recurrent, moderate: Secondary | ICD-10-CM

## 2023-03-23 DIAGNOSIS — F333 Major depressive disorder, recurrent, severe with psychotic symptoms: Secondary | ICD-10-CM

## 2023-03-31 ENCOUNTER — Other Ambulatory Visit: Payer: Self-pay | Admitting: Internal Medicine

## 2023-04-04 ENCOUNTER — Ambulatory Visit: Payer: PPO

## 2023-04-08 ENCOUNTER — Encounter: Payer: Self-pay | Admitting: Internal Medicine

## 2023-04-08 ENCOUNTER — Other Ambulatory Visit: Payer: Self-pay | Admitting: Internal Medicine

## 2023-04-08 MED ORDER — TEMAZEPAM 15 MG PO CAPS: 15.0000 mg | ORAL_CAPSULE | Freq: Every day | ORAL | 0 refills | Status: DC

## 2023-04-08 NOTE — Telephone Encounter (Signed)
 Needs to schedule follow up of mental health med changes have sent in 1 month as curtesy.

## 2023-04-08 NOTE — Telephone Encounter (Signed)
 Copied from CRM [id number]. Topic: Clinical - Medication Refill >> Apr 08, 2023  8:17 AM Theodis Sato wrote: Most Recent Primary Care Visit:  Provider: Wyvonne Lenz  Department: [facility]  Visit Type: MEDICARE AWV, SEQUENTIAL  Date: 02/21/2023  Medication: temazepam (RESTORIL) 15 MG capsule  Has the patient contacted their pharmacy? No,controlled substance.  (Agent: If no, request that the patient contact the pharmacy for the refill. If patient does not wish to contact the pharmacy document the reason why and proceed with request.) (Agent: If yes, when and what did the pharmacy advise?)  Is this the correct pharmacy for this prescription? Yes If no, delete pharmacy and type the correct one.  This is the patient's preferred pharmacy:  [facility] [address], [place] - [address] [place] [address] Phone: [phone] Fax: [phone]  [facility] [id number] - [address] [place] - [address] [place] [address] Phone: [phone] Fax: [phone]   Has the prescription been filled recently? Yes  Is the patient out of the medication? Yes and patient states she has not been sleeping without it.     Has the patient been seen for an appointment in the last year OR does the patient have an upcoming appointment? Yes  Can we respond through MyChart? Yes  Agent: Please be advised that Rx refills may take up to 3 business days. We ask that you follow-up with your pharmacy.

## 2023-04-08 NOTE — Telephone Encounter (Signed)
 This has been a telephone note as well

## 2023-04-11 DIAGNOSIS — F331 Major depressive disorder, recurrent, moderate: Secondary | ICD-10-CM | POA: Diagnosis not present

## 2023-04-11 DIAGNOSIS — Z7982 Long term (current) use of aspirin: Secondary | ICD-10-CM | POA: Diagnosis not present

## 2023-04-11 DIAGNOSIS — J439 Emphysema, unspecified: Secondary | ICD-10-CM | POA: Diagnosis not present

## 2023-04-11 DIAGNOSIS — E785 Hyperlipidemia, unspecified: Secondary | ICD-10-CM | POA: Diagnosis not present

## 2023-04-11 DIAGNOSIS — G8929 Other chronic pain: Secondary | ICD-10-CM | POA: Diagnosis not present

## 2023-04-11 DIAGNOSIS — F419 Anxiety disorder, unspecified: Secondary | ICD-10-CM | POA: Diagnosis not present

## 2023-04-15 ENCOUNTER — Other Ambulatory Visit: Payer: Self-pay | Admitting: Internal Medicine

## 2023-04-24 ENCOUNTER — Other Ambulatory Visit: Payer: Self-pay | Admitting: Internal Medicine

## 2023-04-25 ENCOUNTER — Ambulatory Visit: Payer: Self-pay | Admitting: Internal Medicine

## 2023-05-10 ENCOUNTER — Other Ambulatory Visit: Payer: Self-pay | Admitting: Internal Medicine

## 2023-05-10 ENCOUNTER — Encounter: Payer: Self-pay | Admitting: Internal Medicine

## 2023-05-10 ENCOUNTER — Ambulatory Visit (INDEPENDENT_AMBULATORY_CARE_PROVIDER_SITE_OTHER): Payer: Self-pay | Admitting: Internal Medicine

## 2023-05-10 VITALS — BP 118/88 | HR 63 | Temp 98.1°F | Ht 63.0 in | Wt 127.0 lb

## 2023-05-10 DIAGNOSIS — Z720 Tobacco use: Secondary | ICD-10-CM

## 2023-05-10 DIAGNOSIS — F331 Major depressive disorder, recurrent, moderate: Secondary | ICD-10-CM

## 2023-05-10 DIAGNOSIS — G40909 Epilepsy, unspecified, not intractable, without status epilepticus: Secondary | ICD-10-CM | POA: Diagnosis not present

## 2023-05-10 DIAGNOSIS — I69351 Hemiplegia and hemiparesis following cerebral infarction affecting right dominant side: Secondary | ICD-10-CM

## 2023-05-10 MED ORDER — GABAPENTIN 300 MG PO CAPS: 300.0000 mg | ORAL_CAPSULE | Freq: Two times a day (BID) | ORAL | 3 refills | Status: AC

## 2023-05-10 MED ORDER — REXULTI 3 MG PO TABS: 3.0000 mg | ORAL_TABLET | Freq: Every day | ORAL | 1 refills | Status: DC

## 2023-05-10 MED ORDER — LEVETIRACETAM 500 MG PO TABS: 500.0000 mg | ORAL_TABLET | Freq: Two times a day (BID) | ORAL | 3 refills | Status: AC

## 2023-05-10 MED ORDER — DULOXETINE HCL 60 MG PO CPEP
60.0000 mg | ORAL_CAPSULE | Freq: Every day | ORAL | 3 refills | Status: DC
Start: 2023-05-10 — End: 2023-10-19

## 2023-05-10 MED ORDER — TEMAZEPAM 15 MG PO CAPS: 15.0000 mg | ORAL_CAPSULE | Freq: Every day | ORAL | 1 refills | Status: DC

## 2023-05-10 MED ORDER — ATORVASTATIN CALCIUM 80 MG PO TABS
80.0000 mg | ORAL_TABLET | Freq: Every day | ORAL | 3 refills | Status: DC
Start: 2023-05-10 — End: 2023-10-24

## 2023-05-10 MED ORDER — PRAMIPEXOLE DIHYDROCHLORIDE 0.5 MG PO TABS: 0.5000 mg | ORAL_TABLET | Freq: Every evening | ORAL | 3 refills | Status: DC | PRN

## 2023-05-10 NOTE — Assessment & Plan Note (Signed)
 Stable on keppra 500 mg BID and refilled today. No seizures since last visit or change in medication.

## 2023-05-10 NOTE — Progress Notes (Signed)
   Subjective:   Patient ID: Anna Lyons, female    DOB: 23-Mar-1955, 68 y.o.   MRN: [id number]  HPI The patient is a 68 YO female coming in for follow up mental health. Seeing psych next week. Did resume 1 pill cymbalta daily as change to 2 pills did not affect any change. She is having very low energy and is able to work and do what is needed but not motivated to do anything around the house once home unless necessary.      05/10/2023    1:12 PM 02/21/2023    3:57 PM 02/14/2023    3:14 PM 02/23/2022    2:30 PM 02/20/2021    1:10 PM  Depression screen PHQ 2/9  Decreased Interest 3 0 0 3 3  Down, Depressed, Hopeless 3 3 0 1 3  PHQ - 2 Score 6 3 0 4 6  Altered sleeping 3 3 0 0 2  Tired, decreased energy 3 3 0 3 3  Change in appetite 3 3 0 3 3  Feeling bad or failure about yourself  3 0 0 0 0  Trouble concentrating 2 0 0 3 1  Moving slowly or fidgety/restless 1 0 0 0 0  Suicidal thoughts 1 0 0 0 0  PHQ-9 Score 22 12 0 13 15  Difficult doing work/chores Extremely dIfficult Somewhat difficult Not difficult at all Very difficult Somewhat difficult    Review of Systems  Constitutional:  Positive for activity change and fatigue.  HENT: Negative.    Eyes: Negative.   Respiratory:  Negative for cough, chest tightness and shortness of breath.   Cardiovascular:  Negative for chest pain, palpitations and leg swelling.  Gastrointestinal:  Negative for abdominal distention, abdominal pain, constipation, diarrhea, nausea and vomiting.  Musculoskeletal: Negative.   Skin: Negative.   Neurological: Negative.   Psychiatric/Behavioral:  Positive for decreased concentration, dysphoric mood and sleep disturbance.     Objective:  Physical Exam Constitutional:      Appearance: She is well-developed.  HENT:     Head: Normocephalic and atraumatic.  Cardiovascular:     Rate and Rhythm: Normal rate and regular rhythm.  Pulmonary:     Effort: Pulmonary effort is normal. No respiratory distress.      Breath sounds: Normal breath sounds. No wheezing or rales.  Abdominal:     General: Bowel sounds are normal. There is no distension.     Palpations: Abdomen is soft.     Tenderness: There is no abdominal tenderness. There is no rebound.  Musculoskeletal:     Cervical back: Normal range of motion.  Skin:    General: Skin is warm and dry.  Neurological:     Mental Status: She is alert and oriented to person, place, and time.     Coordination: Coordination normal.     Vitals:   05/10/23 1308  BP: 118/88  Pulse: 63  Temp: 98.1 F (36.7 C)  TempSrc: Oral  SpO2: 90%  Weight: 127 lb (57.6 kg)  Height: 5\' 3"  (1.6 m)    Assessment & Plan:

## 2023-05-10 NOTE — Patient Instructions (Signed)
 [name] will replace the abilify since this is not working well.  We will go back to 1 pill daily cymbalta (duloxetine).

## 2023-05-10 NOTE — Assessment & Plan Note (Signed)
 She is worse than previous visit. We will stop abilify and start rexulti 3 mg daily instead. She will continue cymbalta 60 mg daily. She is seeing psychiatry next week and they will help as she has suffered with this for years without much relief and we have tried many different agents over the years.

## 2023-05-10 NOTE — Assessment & Plan Note (Signed)
 Still has weakness and numbness on the right side of her body and labs will be updated with next set of labs. She is taking aspirin and lipitor 80 mg daily. BP at goal and no known diabetes.

## 2023-05-10 NOTE — Assessment & Plan Note (Signed)
 She stopped smoking 03/14/23 and congratulated. Encouraged lifelong cessation. She feels better since stopping smoking.

## 2023-05-12 ENCOUNTER — Other Ambulatory Visit: Payer: Self-pay

## 2023-05-12 ENCOUNTER — Ambulatory Visit (INDEPENDENT_AMBULATORY_CARE_PROVIDER_SITE_OTHER)

## 2023-05-12 VITALS — BP 118/88 | Ht 63.0 in | Wt 127.0 lb

## 2023-05-12 DIAGNOSIS — Z0000 Encounter for general adult medical examination without abnormal findings: Secondary | ICD-10-CM

## 2023-05-12 DIAGNOSIS — Z87891 Personal history of nicotine dependence: Secondary | ICD-10-CM

## 2023-05-12 DIAGNOSIS — Z122 Encounter for screening for malignant neoplasm of respiratory organs: Secondary | ICD-10-CM | POA: Diagnosis not present

## 2023-05-12 NOTE — Patient Instructions (Addendum)
 Ms. Buskirk , Thank you for taking time to come for your Medicare Wellness Visit. I appreciate your ongoing commitment to your health goals. Please review the following plan we discussed and let me know if I can assist you in the future.   Referrals/Orders/Follow-Ups/Clinician Recommendations: Aim for 30 minutes of exercise or brisk walking, 6-8 glasses of water, and 5 servings of fruits and vegetables each day.   This is a list of the screening recommended for you and due dates:  Health Maintenance  Topic Date Due   Zoster (Shingles) Vaccine (1 of 2) 02/02/2004   Screening for Lung Cancer  11/20/2020   COVID-19 Vaccine (5 - 2024-25 season) 04/12/2023   Flu Shot  09/02/2023   DTaP/Tdap/Td vaccine (2 - Td or Tdap) 12/21/2023   Medicare Annual Wellness Visit  05/11/2024   Colon Cancer Screening  12/17/2025   Pneumonia Vaccine  Completed   DEXA scan (bone density measurement)  Completed   Hepatitis C Screening  Completed   HPV Vaccine  Aged Out   Meningitis B Vaccine  Aged Out    Advanced directives: (Provided) Advance directive discussed with you today. I have provided a copy for you to complete at home and have notarized. Once this is complete, please bring a copy in to our office so we can scan it into your chart.   Next Medicare Annual Wellness Visit scheduled for next year: Yes

## 2023-05-12 NOTE — Progress Notes (Signed)
 Subjective:   Anna Lyons is a 68 y.o. who presents for a Medicare Wellness preventive visit.  Visit Complete: Virtual I connected with  Anna Lyons on 05/12/23 by a audio enabled telemedicine application and verified that I am speaking with the correct person using two identifiers.  Patient Location: Home  Provider Location: Office/Clinic  I discussed the limitations of evaluation and management by telemedicine. The patient expressed understanding and agreed to proceed.  Vital Signs: Because this visit was a virtual/telehealth visit, some criteria may be missing or patient reported. Any vitals not documented were not able to be obtained and vitals that have been documented are patient reported.  VideoDeclined- This patient declined Librarian, academic. Therefore the visit was completed with audio only.  Persons Participating in Visit: Patient.  AWV Questionnaire: No: Patient Medicare AWV questionnaire was not completed prior to this visit.  Cardiac Risk Factors include: advanced age (>78men, >53 women);dyslipidemia;hypertension     Objective:    Today's Vitals   05/12/23 1055  BP: 118/88  Weight: 127 lb (57.6 kg)  Height: 5\' 3"  (1.6 m)   Body mass index is 22.5 kg/m.     05/12/2023   10:54 AM 02/21/2023    3:52 PM 01/25/2023    4:34 PM 02/20/2021    1:07 PM 02/08/2020   11:09 AM 11/30/2018    5:33 PM 10/12/2018    2:00 AM  Advanced Directives  Does Patient Have a Medical Advance Directive? No Yes No Yes No No Yes  Type of Furniture conservator/restorer;Living will  Living will;Healthcare Power of [employer] Power of Attorney  Does patient want to make changes to medical advance directive?    No - Patient declined   No - Patient declined  Copy of Healthcare Power of Attorney in Chart?  No - copy requested  No - copy requested   No - copy requested  Would patient like information on creating a medical advance  directive? Yes (MAU/Ambulatory/Procedural Areas - Information given)    No - Patient declined No - Patient declined No - Patient declined    Current Medications (verified) Outpatient Encounter Medications as of 05/12/2023  Medication Sig   albuterol (PROVENTIL HFA;VENTOLIN HFA) 108 (90 Base) MCG/ACT inhaler Inhale 2 puffs into the lungs every 6 (six) hours as needed for wheezing or shortness of breath.   aspirin EC 81 MG tablet Take 81 mg by mouth daily.   atorvastatin (LIPITOR) 80 MG tablet Take 1 tablet (80 mg total) by mouth at bedtime.   Brexpiprazole ([name]) 3 MG TABS Take 1 tablet (3 mg total) by mouth daily.   cholecalciferol (VITAMIN D3) 25 MCG (1000 UNIT) tablet Take 1,000 Units by mouth daily.   DULoxetine (CYMBALTA) 60 MG capsule Take 1 capsule (60 mg total) by mouth daily.   ferrous sulfate 324 MG TBEC Take 324 mg by mouth.   gabapentin (NEURONTIN) 300 MG capsule Take 1 capsule (300 mg total) by mouth 2 (two) times daily.   levETIRAcetam (KEPPRA) 500 MG tablet Take 1 tablet (500 mg total) by mouth 2 (two) times daily.   Magnesium 250 MG TABS Take by mouth.   Multiple Vitamin (MULTIVITAMIN WITH MINERALS) TABS tablet Take 1 tablet by mouth daily.   pramipexole (MIRAPEX) 0.5 MG tablet Take 1 tablet (0.5 mg total) by mouth at bedtime as needed.   temazepam (RESTORIL) 15 MG capsule Take 1 capsule (15 mg total) by mouth at bedtime.  vitamin B-12 (CYANOCOBALAMIN) 1000 MCG tablet Take 1,000 mcg by mouth daily.   No facility-administered encounter medications on file as of 05/12/2023.    Allergies (verified) Codeine and Oxycodone-acetaminophen   History: Past Medical History:  Diagnosis Date   Angina    Breast cyst    Cancer (HCC)    Depression    Hypertension    RLS (restless legs syndrome) 11/16/2011   Seizures (HCC) 2016   last seizure="last year, 2016" per pt.   Stroke (HCC) 04/20/2011   a. 04/20/11 Left subcortical infarct treated w/ TPA;  b. 04/2011 Echo: EF 55-60%;  c.  04/2011 Normal Carotid u/s  d. Residual "walk w/limp on right; unable to grasp w/right hand"   TIA (transient ischemic attack) 08/2010   Tobacco abuse    a. quit @ time of CVA 04/2011.   Past Surgical History:  Procedure Laterality Date   BREAST RECONSTRUCTION     bilaterally   CARPAL TUNNEL RELEASE Left 1990's   CARPAL TUNNEL RELEASE Left 12/08/2015   CESAREAN SECTION  1979   KNEE ARTHROSCOPY Left 1990's    cartilage repair   LEFT HEART CATHETERIZATION WITH CORONARY ANGIOGRAM N/A 05/20/2011   Procedure: LEFT HEART CATHETERIZATION WITH CORONARY ANGIOGRAM;  Surgeon: Wendall Stade, MD;  [facility] C[facility] LAB;  Service: Cardiovascular;  Laterality: N/A;   MASTECTOMY Bilateral 1980's   bilateral with reconstruction, breast cancer on left   Family History  Problem Relation Age of Onset   Lupus Mother        died early 58's.   Emphysema Father        died late 70's.   COPD Father    Pancreatic cancer Brother    Bladder Cancer Brother    Rectal cancer Brother    Suicidality Brother    Colon cancer Neg Hx    Social History   Socioeconomic History   Marital status: Widowed    Spouse name: Not on file   Number of children: 1   Years of education: 33   Highest education level: Not on file  Occupational History   Occupation: disabled  Tobacco Use   Smoking status: Former    Current packs/day: 0.75    Average packs/day: 0.8 packs/day for 50.3 years (37.7 ttl pk-yrs)    Types: Cigarettes    Start date: 02/01/1973    Passive exposure: Past   Smokeless tobacco: Never  Vaping Use   Vaping status: Never Used  Substance and Sexual Activity   Alcohol use: Yes    Alcohol/week: 1.0 standard drink of alcohol    Types: 1 Glasses of wine per week    Comment: 1 glass at night 2x a week   Drug use: No   Sexual activity: Not Currently  Other Topics Concern   Not on file  Social History Narrative   Pt lives alone.   Caffeine Use: 1-3 cups of caffeine daily (tea)   Social Drivers of  Health   Financial Resource Strain: Low Risk  (05/12/2023)   Overall Financial Resource Strain (CARDIA)    Difficulty of Paying Living Expenses: Not hard at all  Food Insecurity: No Food Insecurity (05/12/2023)   Hunger Vital Sign    Worried About Running Out of Food in the Last Year: Never true    Ran Out of Food in the Last Year: Never true  Transportation Needs: No Transportation Needs (05/12/2023)   PRAPARE - Transportation    Lack of Transportation (Medical): No    Lack of  Transportation (Non-Medical): No  Physical Activity: Insufficiently Active (05/12/2023)   Exercise Vital Sign    Days of Exercise per Week: 4 days    Minutes of Exercise per Session: 20 min  Stress: Stress Concern Present (05/12/2023)   [employer] of Occupational Health - Occupational Stress Questionnaire    Feeling of Stress : Rather much  Social Connections: Moderately Isolated (05/12/2023)   Social Connection and Isolation Panel [NHANES]    Frequency of Communication with Friends and Family: More than three times a week    Frequency of Social Gatherings with Friends and Family: Twice a week    Attends Religious Services: 1 to 4 times per year    Active Member of [employer] or Organizations: No    Attends Banker Meetings: Never    Marital Status: Widowed    Tobacco Counseling Counseling given: Not Answered    Clinical Intake:  Pre-visit preparation completed: Yes  Pain : No/denies pain     BMI - recorded: 22.5 Nutritional Status: BMI of 19-24  Normal Nutritional Risks: None Diabetes: No  Lab Results  Component Value Date   HGBA1C 5.3 10/12/2018   HGBA1C 5.6 09/27/2018   HGBA1C 5.8 03/05/2015     How often do you need to have someone help you when you read instructions, pamphlets, or other written materials from your doctor or pharmacy?: 1 - Never  Interpreter Needed?: No  Information entered by :: Hassell Halim, CMA   Activities of Daily Living     05/12/2023   10:59  AM 02/21/2023    3:41 PM  In your present state of health, do you have any difficulty performing the following activities:  Hearing? 0 0  Vision? 0 0  Difficulty concentrating or making decisions? 0 1  Walking or climbing stairs? 0 1  Dressing or bathing? 0 0  Doing errands, shopping? 0 0  Preparing Food and eating ? N N  Using the Toilet? N N  In the past six months, have you accidently leaked urine? N N  Do you have problems with loss of bowel control? N N  Managing your Medications? N N  Managing your Finances? N N  Housekeeping or managing your Housekeeping? N     Patient Care Team: Myrlene Broker, MD as PCP - General (Internal Medicine) Wynn Banker Victorino Sparrow, MD as Consulting Physician (Physical Medicine and Rehabilitation) Kathyrn Sheriff, [facility] (Inactive) as Pharmacist (Pharmacist)  Indicate any recent Medical Services you may have received from other than [facility] providers in the past year (date may be approximate).     Assessment:   This is a routine wellness examination for [place].  Hearing/Vision screen Hearing Screening - Comments:: Denies hearing difficulties   Vision Screening - Comments:: Wears rx glasses - up to date with routine eye exams with [facility]   Goals Addressed               This Visit's Progress     Patient Stated (pt-stated)        Patient stated she plans to continue staying active.        Depression Screen     05/12/2023   11:06 AM 05/10/2023    1:12 PM 02/21/2023    3:57 PM 02/14/2023    3:14 PM 02/23/2022    2:30 PM 02/20/2021    1:10 PM 10/03/2020   11:03 AM  PHQ 2/9 Scores  PHQ - 2 Score 6 6 3  0 4 6 6   PHQ- 9  Score 23 22 12  0 13 15 17     Fall Risk     05/12/2023   11:00 AM 05/10/2023    1:12 PM 02/21/2023    3:53 PM 02/23/2022    2:29 PM 02/20/2021    1:08 PM  Fall Risk   Falls in the past year? 0 0 0 0 1  Number falls in past yr: 0 0 0 0 0  Injury with Fall? 0 0 0 0 0  Risk for fall due to : No Fall Risks   No Fall Risks No Fall Risks No Fall Risks  Follow up Falls prevention discussed;Falls evaluation completed Falls evaluation completed Falls prevention discussed;Falls evaluation completed Falls evaluation completed Falls evaluation completed    MEDICARE RISK AT HOME:  Medicare Risk at Home Any stairs in or around the home?: Yes If so, are there any without handrails?: No Home free of loose throw rugs in walkways, pet beds, electrical cords, etc?: Yes Adequate lighting in your home to reduce risk of falls?: Yes Life alert?: No Use of a cane, walker or w/c?: Yes (walker) Grab bars in the bathroom?: Yes Shower chair or bench in shower?: Yes Elevated toilet seat or a handicapped toilet?: Yes  TIMED UP AND GO:  Was the test performed?  No  Cognitive Function: 6CIT completed    09/01/2016   11:44 AM 05/24/2014    1:53 PM  MMSE - Mini Mental State Exam  Orientation to time 5 5  Orientation to Place 5 5  Registration 3 3  Attention/ Calculation 5 5  Recall 3 3  Language- name 2 objects 2 2  Language- repeat 1 1  Language- follow 3 step command 3 3  Language- read & follow direction 1 1  Write a sentence 1 1  Copy design 1 1  Total score 30 30        05/12/2023   11:02 AM 02/21/2023    3:42 PM  6CIT Screen  What Year? 0 points 0 points  What month? 0 points 0 points  What time? 0 points 0 points  Count back from 20 0 points 0 points  Months in reverse 4 points 0 points  Repeat phrase 0 points 0 points  Total Score 4 points 0 points    Immunizations Immunization History  Administered Date(s) Administered   Fluad Quad(high Dose 65+) 10/08/2021   Influenza Split 11/17/2011   Influenza Whole 11/01/2013   Influenza,inj,Quad PF,6+ Mos 09/23/2015, 10/28/2016, 10/17/2017, 09/27/2018, 10/11/2019   Influenza-Unspecified 11/12/2014, 10/13/2022   PFIZER(Purple Top)SARS-COV-2 Vaccination 03/10/2019, 04/02/2019, 10/15/2021   PNEUMOCOCCAL CONJUGATE-20 10/13/2022   PPD Test  05/15/2014   Pneumococcal Conjugate-13 10/11/2019   Pneumococcal Polysaccharide-23 11/17/2011   Pneumococcal-Unspecified 10/08/2021   Tdap 12/20/2013   Unspecified SARS-COV-2 Vaccination 10/13/2022   Zoster, Live 12/13/2013    Screening Tests Health Maintenance  Topic Date Due   Zoster Vaccines- Shingrix (1 of 2) 02/02/2004   Lung Cancer Screening  11/20/2020   COVID-19 Vaccine (5 - 2024-25 season) 04/12/2023   INFLUENZA VACCINE  09/02/2023   DTaP/Tdap/Td (2 - Td or Tdap) 12/21/2023   Medicare Annual Wellness (AWV)  05/11/2024   Colonoscopy  12/17/2025   Pneumonia Vaccine 40+ Years old  Completed   DEXA SCAN  Completed   Hepatitis C Screening  Completed   HPV VACCINES  Aged Out   Meningococcal B Vaccine  Aged Out    Health Maintenance  Health Maintenance Due  Topic Date Due   Zoster Vaccines- Shingrix (1  of 2) 02/02/2004   Lung Cancer Screening  11/20/2020   COVID-19 Vaccine (5 - 2024-25 season) 04/12/2023   Health Maintenance Items Addressed: 05/12/2023 Pt is scheduled to see a Psychologist on 05/17/2023 due to feeling depressed since loss of Spouse.  Pt stated has had vaccines at local pharmacy and will obtain a copy for PCP to update record.  Additional Screening:  Vision Screening: Recommended annual ophthalmology exams for early detection of glaucoma and other disorders of the eye.  Dental Screening: Recommended annual dental exams for proper oral hygiene  Community Resource Referral / Chronic Care Management: CRR required this visit?  No   CCM required this visit?  No     Plan:     I have personally reviewed and noted the following in the patient's chart:   Medical and social history Use of alcohol, tobacco or illicit drugs  Current medications and supplements including opioid prescriptions. Patient is not currently taking opioid prescriptions. Functional ability and status Nutritional status Physical activity Advanced directives List of other  physicians Hospitalizations, surgeries, and ER visits in previous 12 months Vitals Screenings to include cognitive, depression, and falls Referrals and appointments  In addition, I have reviewed and discussed with patient certain preventive protocols, quality metrics, and best practice recommendations. A written personalized care plan for preventive services as well as general preventive health recommendations were provided to patient.     Darreld Mclean, CMA   05/12/2023   After Visit Summary: (MyChart) Due to this being a telephonic visit, the after visit summary with patients personalized plan was offered to patient via MyChart   Notes: Nothing significant to report at this time.

## 2023-05-17 ENCOUNTER — Encounter: Payer: Self-pay | Admitting: Behavioral Health

## 2023-05-17 ENCOUNTER — Ambulatory Visit: Admitting: Behavioral Health

## 2023-05-17 VITALS — BP 153/83 | HR 106 | Ht 62.0 in | Wt 128.0 lb

## 2023-05-17 DIAGNOSIS — F4310 Post-traumatic stress disorder, unspecified: Secondary | ICD-10-CM
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
## 2023-05-23 ENCOUNTER — Ambulatory Visit (INDEPENDENT_AMBULATORY_CARE_PROVIDER_SITE_OTHER): Admitting: Family Medicine

## 2023-05-23 ENCOUNTER — Encounter: Payer: Self-pay | Admitting: Family Medicine

## 2023-05-23 ENCOUNTER — Telehealth: Payer: Self-pay | Admitting: Behavioral Health

## 2023-05-23 VITALS — BP 136/78 | HR 80 | Temp 97.9°F | Ht 62.0 in | Wt 130.0 lb

## 2023-05-23 DIAGNOSIS — J029 Acute pharyngitis, unspecified: Secondary | ICD-10-CM

## 2023-05-23 DIAGNOSIS — K146 Glossodynia: Secondary | ICD-10-CM

## 2023-05-23 DIAGNOSIS — F331 Major depressive disorder, recurrent, moderate: Secondary | ICD-10-CM | POA: Diagnosis not present

## 2023-05-23 LAB — POCT RAPID STREP A (OFFICE): Rapid Strep A Screen: NEGATIVE

## 2023-05-23 LAB — CBC WITH DIFFERENTIAL/PLATELET
Basophils Absolute: 0 10*3/uL (ref 0.0–0.1)
Basophils Relative: 0.6 % (ref 0.0–3.0)
Eosinophils Absolute: 0.1 10*3/uL (ref 0.0–0.7)
Eosinophils Relative: 2.1 % (ref 0.0–5.0)
HCT: 36.7 % (ref 36.0–46.0)
Hemoglobin: 12.4 g/dL (ref 12.0–15.0)
Lymphocytes Relative: 21.5 % (ref 12.0–46.0)
Lymphs Abs: 0.9 10*3/uL (ref 0.7–4.0)
MCHC: 33.7 g/dL (ref 30.0–36.0)
MCV: 93.8 fl (ref 78.0–100.0)
Monocytes Absolute: 0.5 10*3/uL (ref 0.1–1.0)
Monocytes Relative: 12 % (ref 3.0–12.0)
Neutro Abs: 2.6 10*3/uL (ref 1.4–7.7)
Neutrophils Relative %: 63.8 % (ref 43.0–77.0)
Platelets: 236 10*3/uL (ref 150.0–400.0)
RBC: 3.91 Mil/uL (ref 3.87–5.11)
RDW: 12.7 % (ref 11.5–15.5)
WBC: 4.2 10*3/uL (ref 4.0–10.5)

## 2023-05-23 MED ORDER — NYSTATIN 100000 UNIT/ML MT SUSP: 5.0000 mL | Freq: Four times a day (QID) | OROMUCOSAL | 0 refills | Status: DC

## 2023-05-23 NOTE — Telephone Encounter (Signed)
 Pt seen 4/15 and started on [name] .   We agreed today to: Will take 1/2 tablet of Abilify  for one week and then stop To start [name]  0.5 mg for one week, then 1 mg for one week, then 2 mg daily. May take at bedtime To continue Cymbalta  60 mg daily at bedtime To continue Temazapam 15 mg at bedtime for sleep  She is reporting she's had a sore throat and blisters on her tongue since starting the [name] . She was prescribed Magic Mouthwash by her PCP today. She denies environmental allergies.

## 2023-05-23 NOTE — Telephone Encounter (Signed)
 Pt lvm wanting to talk to nurse about side effects from medication. Please call her at [phone]

## 2023-05-23 NOTE — Progress Notes (Signed)
 Assessment & Plan:  1-2. Sore throat (Primary)/Tongue sore Discussed symptoms are likely a result [name]  that she recently started as it can cause a decrease in WBC and lead to these symptoms. Recommended she speak with her psychiatrist. Will obtain CBC and treat sores with magic mouthwash.  - POCT rapid strep A (negative) - CBC w/Diff - magic mouthwash (nystatin , lidocaine , diphenhydrAMINE, alum & mag hydroxide) suspension; Swish and swallow 5 mLs 4 (four) times daily.  Dispense: 180 mL; Refill: 0  3. Moderate episode of recurrent major depressive disorder (HCC) Improving with [name] , but the medication is causing side effects. Recommended she speak with her psychiatrist. Will obtain CBC and treat sores with magic mouthwash.  - CBC w/Diff   Follow up plan: Return if symptoms worsen or fail to improve.  Anna Los, MSN, APRN, FNP-C  Subjective:  HPI: Anna Lyons is a 68 y.o. female presenting on 05/23/2023 for Sore Throat (X 1 week - also has blisters on tongue/Taking Biotin and IBU PRN, halls cough drops, salt water gargles   )  Patient reports a sore throat with blisters on her tongue that have been present x1 week. She has been taking Biotin mouthwash, Ibuprofen , halls cough drops, and doing salt water gargles for her symptoms, which are not helping. She feels her symptoms are worsening. She is having trouble eating but is staying hydrated and drinking protein shakes. She did start [name]  a week ago as well.     ROS: Negative unless specifically indicated above in HPI.   Relevant past medical history reviewed and updated as indicated.   Allergies and medications reviewed and updated.   Current Outpatient Medications:    aspirin  EC 81 MG tablet, Take 81 mg by mouth daily., Disp: , Rfl:    atorvastatin  (LIPITOR ) 80 MG tablet, Take 1 tablet (80 mg total) by mouth at bedtime., Disp: 90 tablet, Rfl: 3   Brexpiprazole  ([name] ) 3 MG TABS, Take 1 tablet (3 mg total) by mouth  daily., Disp: 90 tablet, Rfl: 1   cholecalciferol (VITAMIN D3) 25 MCG (1000 UNIT) tablet, Take 1,000 Units by mouth daily., Disp: , Rfl:    DULoxetine  (CYMBALTA ) 60 MG capsule, Take 1 capsule (60 mg total) by mouth daily., Disp: 90 capsule, Rfl: 3   ferrous sulfate 324 MG TBEC, Take 324 mg by mouth., Disp: , Rfl:    gabapentin  (NEURONTIN ) 300 MG capsule, Take 1 capsule (300 mg total) by mouth 2 (two) times daily., Disp: 180 capsule, Rfl: 3   levETIRAcetam  (KEPPRA ) 500 MG tablet, Take 1 tablet (500 mg total) by mouth 2 (two) times daily., Disp: 180 tablet, Rfl: 3   Magnesium  250 MG TABS, Take by mouth., Disp: , Rfl:    Multiple Vitamin (MULTIVITAMIN WITH MINERALS) TABS tablet, Take 1 tablet by mouth daily., Disp: , Rfl:    pramipexole  (MIRAPEX ) 0.5 MG tablet, Take 1 tablet (0.5 mg total) by mouth at bedtime as needed., Disp: 90 tablet, Rfl: 3   temazepam  (RESTORIL ) 15 MG capsule, Take 1 capsule (15 mg total) by mouth at bedtime., Disp: 90 capsule, Rfl: 1   vitamin B-12 (CYANOCOBALAMIN ) 1000 MCG tablet, Take 1,000 mcg by mouth daily., Disp: , Rfl:   Allergies  Allergen Reactions   Codeine Itching   Oxycodone -Acetaminophen  Hives    Objective:   BP 136/78   Pulse 80   Temp 97.9 F (36.6 C)   Ht 5\' 2"  (1.575 m)   Wt 130 lb (59 kg)   SpO2 98%  BMI 23.78 kg/m    Physical Exam Vitals reviewed.  Constitutional:      General: She is not in acute distress.    Appearance: Normal appearance. She is not ill-appearing, toxic-appearing or diaphoretic.  HENT:     Head: Normocephalic and atraumatic.     Mouth/Throat:     Dentition: Gum lesions (one on the bottom in the front) present.     Tongue: Lesions (one on the left side and one on the tip) present.  Eyes:     General: No scleral icterus.       Right eye: No discharge.        Left eye: No discharge.     Conjunctiva/sclera: Conjunctivae normal.  Cardiovascular:     Rate and Rhythm: Normal rate.  Pulmonary:     Effort: Pulmonary  effort is normal. No respiratory distress.  Musculoskeletal:        General: Normal range of motion.     Cervical back: Normal range of motion.  Skin:    General: Skin is warm and dry.     Capillary Refill: Capillary refill takes less than 2 seconds.  Neurological:     General: No focal deficit present.     Mental Status: She is alert and oriented to person, place, and time. Mental status is at baseline.  Psychiatric:        Mood and Affect: Mood normal.        Behavior: Behavior normal.        Thought Content: Thought content normal.        Judgment: Judgment normal.

## 2023-05-24 ENCOUNTER — Other Ambulatory Visit: Payer: Self-pay

## 2023-05-24 ENCOUNTER — Encounter: Payer: Self-pay | Admitting: Family Medicine

## 2023-05-24 DIAGNOSIS — K146 Glossodynia: Secondary | ICD-10-CM

## 2023-05-24 NOTE — Telephone Encounter (Signed)
 Copied from CRM [id number]. Topic: Clinical - Prescription Issue >> May 24, 2023 11:15 AM Chuck Crater wrote: Reason for CRM: Patient states that pharmacy still doesn't have prescription for magic mouthwash (nystatin , lidocaine , diphenhydrAMINE, alum & mag hydroxide) suspension and she really needs it.

## 2023-05-24 NOTE — Telephone Encounter (Signed)
 Patient notified of recommendations.

## 2023-05-25 ENCOUNTER — Ambulatory Visit
Admission: RE | Admit: 2023-05-25 | Discharge: 2023-05-25 | Disposition: A | Discharge status: Home / Self Care | Source: Ambulatory Visit | Attending: Physician Assistant | Admitting: Physician Assistant

## 2023-05-25 VITALS — BP 137/92 | HR 79 | Temp 98.0°F | Resp 20 | Ht 62.0 in | Wt 130.0 lb

## 2023-05-25 DIAGNOSIS — K146 Glossodynia: Secondary | ICD-10-CM | POA: Insufficient documentation

## 2023-05-25 DIAGNOSIS — K1379 Other lesions of oral mucosa: Secondary | ICD-10-CM | POA: Insufficient documentation

## 2023-05-25 LAB — POCT RAPID STREP A (OFFICE): Rapid Strep A Screen: NEGATIVE

## 2023-05-25 MED ORDER — NYSTATIN 100000 UNIT/ML MT SUSP: 5.0000 mL | Freq: Four times a day (QID) | OROMUCOSAL | 0 refills | Status: DC

## 2023-05-25 MED ORDER — FLUCONAZOLE 150 MG PO TABS: 150.0000 mg | ORAL_TABLET | ORAL | 0 refills | Status: DC | PRN

## 2023-05-25 NOTE — ED Provider Notes (Signed)
 Anna Lyons    CSN: [id number] Arrival date & time: 05/25/23  1409      History   Chief Complaint Chief Complaint  Patient presents with   Blister    Blisters in throat and on tongue. - Entered by patient    HPI Anna Lyons is a 68 y.o. female.   HPI  She is here with her daughter, Anna Lyons who is assisting with HPI She reports that the patient's symptoms started last Wed with sore throat  She then started to develop ulcerations and pain along the tongue and lower gumline  She was seen by her PCP office on 05/23/23 for this and given magic mouthwash script, rapid strep then was negative  She reports limited relief with magic mouth wash, Orajel, salt water rinses She states there is burning sensation along lower gum line She has been using magic mouthwash since yesterday   Of note patient recently stopped smoking    Past Medical History:  Diagnosis Date   Angina    Breast cyst    Cancer (HCC)    Depression    Hypertension    RLS (restless legs syndrome) 11/16/2011   Seizures (HCC) 2016   last seizure="last year, 2016" per pt.   Stroke (HCC) 04/20/2011   a. 04/20/11 Left subcortical infarct treated w/ TPA;  b. 04/2011 Echo: EF 55-60%;  c. 04/2011 Normal Carotid u/s  d. Residual "walk w/limp on right; unable to grasp w/right hand"   TIA (transient ischemic attack) 08/2010   Tobacco abuse    a. quit @ time of CVA 04/2011.    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)     Past Surgical History:  Procedure Laterality Date   BREAST RECONSTRUCTION     bilaterally   CARPAL TUNNEL RELEASE Left 1990's   CARPAL TUNNEL RELEASE Left 12/08/2015   CESAREAN SECTION  1979   KNEE ARTHROSCOPY Left 1990's    cartilage repair   LEFT HEART CATHETERIZATION WITH CORONARY ANGIOGRAM N/A 05/20/2011   Procedure: LEFT HEART CATHETERIZATION WITH CORONARY ANGIOGRAM;  Surgeon: Loyde Rule, MD;  Location: [facility] CATH LAB;  Service: Cardiovascular;  Laterality: N/A;   MASTECTOMY Bilateral 1980's   bilateral with reconstruction, breast cancer on left    OB History   No obstetric history on file.      Home Medications    Prior to Admission medications   Medication Sig Start Date End Date Taking? Authorizing Provider  fluconazole  (DIFLUCAN ) 150 MG tablet Take 1 tablet (150 mg total) by mouth every three (3) days as needed. May repeat in 3 days if symptoms not resolved 05/25/23  Yes Becca Bayne E, PA-C  aspirin  EC 81 MG tablet Take 81 mg by mouth daily.    [provider]  atorvastatin  (LIPITOR ) 80 MG tablet Take 1 tablet (80 mg total) by mouth  at bedtime. 05/10/23   Adelia Homestead, MD  Brexpiprazole  ([name] ) 3 MG TABS Take 1 tablet (3 mg total) by mouth daily. 05/10/23   Adelia Homestead, MD  cholecalciferol (VITAMIN D3) 25 MCG (1000 UNIT) tablet Take 1,000 Units by mouth daily.    [provider]  DULoxetine  (CYMBALTA ) 60 MG capsule Take 1 capsule (60 mg total) by mouth daily. 05/10/23   Adelia Homestead, MD  ferrous sulfate 324 MG TBEC Take 324 mg by mouth.    [provider]  gabapentin  (NEURONTIN ) 300 MG capsule Take 1 capsule (300 mg total) by mouth 2 (two) times daily. 05/10/23   Adelia Homestead, MD  levETIRAcetam  (KEPPRA ) 500 MG tablet Take 1 tablet  (500 mg total) by mouth 2 (two) times daily. 05/10/23   Adelia Homestead, MD  magic mouthwash (nystatin , lidocaine , diphenhydrAMINE, alum & mag hydroxide) suspension Swish and swallow 5 mLs 4 (four) times daily. 05/25/23   Adelia Homestead, MD  Magnesium  250 MG TABS Take by mouth.    [provider]  Multiple Vitamin (MULTIVITAMIN WITH MINERALS) TABS tablet Take 1 tablet by mouth daily.    [provider]  pramipexole  (MIRAPEX ) 0.5 MG tablet Take 1 tablet (0.5 mg total) by mouth at bedtime as needed. 05/10/23   Adelia Homestead, MD  temazepam  (RESTORIL ) 15 MG capsule Take 1 capsule (15 mg total) by mouth at bedtime. 05/10/23   Adelia Homestead, MD  vitamin B-12 (CYANOCOBALAMIN ) 1000 MCG tablet Take 1,000 mcg by mouth daily.    [provider]    Family History Family History  Problem Relation Age of Onset   Depression Mother    Anxiety disorder Mother    Lupus Mother        died early 8's.   Alcohol abuse Father    Emphysema Father        died late 41's.   COPD Father    Sexual abuse Brother    Alcohol abuse Brother    Suicidality Brother    Pancreatic cancer Brother    Severe combined immunodeficiency Brother    Alcohol abuse Brother    Bladder Cancer Brother    Alcohol abuse Brother    Rectal cancer Brother    Alcohol abuse Brother    Suicidality Brother    Alcohol abuse Brother    Colon cancer Neg Hx     Social History Social History   Tobacco Use   Smoking status: Former    Current packs/day: 0.75    Average packs/day: 0.8 packs/day for 50.3 years (37.7 ttl pk-yrs)    Types: Cigarettes    Start date: 02/01/1973    Passive exposure: Past   Smokeless tobacco: Never  Vaping Use   Vaping status: Never Used  Substance Use Topics   Alcohol use: Yes    Alcohol/week: 1.0 standard drink of alcohol    Types: 1 Glasses of wine per week    Comment: 1 glass at night 2x a week   Drug use: No     Allergies   Codeine and  Oxycodone -acetaminophen    Review of Systems Review of Systems  Constitutional:  Negative for chills and fever.  HENT:  Positive for mouth sores.      Physical Exam Triage Vital Signs ED Triage Vitals  Encounter Vitals Group     BP 05/25/23 1426 (!) 137/92     Systolic BP Percentile --      Diastolic BP Percentile --  Pulse Rate 05/25/23 1426 79     Resp 05/25/23 1426 20     Temp 05/25/23 1426 98 F (36.7 C)     Temp Source 05/25/23 1426 Oral     SpO2 05/25/23 1426 90 %     Weight 05/25/23 1426 130 lb (59 kg)     Height 05/25/23 1426 5\' 2"  (1.575 m)     Head Circumference --      Peak Flow --      Pain Score 05/25/23 1434 10     Pain Loc --      Pain Education --      Exclude from Growth Chart --    No data found.  Updated Vital Signs BP (!) 137/92 (BP Location: Right Arm)   Pulse 79   Temp 98 F (36.7 C) (Oral)   Resp 20   Ht 5\' 2"  (1.575 m)   Wt 130 lb (59 kg)   SpO2 90%   BMI 23.78 kg/m   Visual Acuity Right Eye Distance:   Left Eye Distance:   Bilateral Distance:    Right Eye Near:   Left Eye Near:    Bilateral Near:     Physical Exam Vitals reviewed.  Constitutional:      General: She is awake.     Appearance: Normal appearance. She is well-developed and well-groomed.  HENT:     Head: Normocephalic and atraumatic.     Mouth/Throat:     Lips: Pink.     Mouth: Mucous membranes are moist. Oral lesions present.     Dentition: Has dentures. Gum lesions present. No dental tenderness or dental caries.     Tongue: Lesions present. Tongue does not deviate from midline.     Palate: No mass and lesions.     Pharynx: Oropharynx is clear. Uvula midline. No pharyngeal swelling, oropharyngeal exudate, posterior oropharyngeal erythema, uvula swelling or postnasal drip.     Tonsils: No tonsillar exudate or tonsillar abscesses.  Eyes:     General: Lids are normal. Gaze aligned appropriately.     Extraocular Movements: Extraocular movements intact.      Conjunctiva/sclera: Conjunctivae normal.  Pulmonary:     Effort: Pulmonary effort is normal.  Lymphadenopathy:     Head:     Right side of head: No submental, submandibular or preauricular adenopathy.     Left side of head: No submental, submandibular or preauricular adenopathy.     Cervical:     Right cervical: No superficial cervical adenopathy.    Left cervical: No superficial cervical adenopathy.  Neurological:     General: No focal deficit present.     Mental Status: She is alert and oriented to person, place, and time.     GCS: GCS eye subscore is 4. GCS verbal subscore is 5. GCS motor subscore is 6.     Cranial Nerves: No cranial nerve deficit, dysarthria or facial asymmetry.  Psychiatric:        Attention and Perception: Attention and perception normal.        Mood and Affect: Mood and affect normal.        Speech: Speech normal.        Behavior: Behavior normal. Behavior is cooperative.      Lyons Treatments / Results  Labs (all labs ordered are listed, but only abnormal results are displayed) Labs Reviewed  POCT RAPID STREP A (OFFICE) - Normal  CULTURE, GROUP A STREP [facility])    EKG   Radiology No results found.  Procedures Procedures (including critical care time)  Medications Ordered in Lyons Medications - No data to display  Initial Impression / Assessment and Plan / Lyons Course  I have reviewed the triage vital signs and the nursing notes.  Pertinent labs & imaging results that were available during my care of the patient were reviewed by me and considered in my medical decision making (see chart for details).     \ Final Clinical Impressions(s) / Lyons Diagnoses   Final diagnoses:  Acute pain of mouth  Tongue pain   Patient presents today with her daughter.  They expressed concerns for continued painful, oral lesions that are not responding to Magic mouthwash.  She is only taken Magic mouthwash for about a day as she was most recently seen on 05/23/2023 for  similar concern at PCP office.  At that appointment rapid strep was negative and CBC tests were overall normal.  We repeated rapid strep today which was also negative and will send off for culture for peace of mind for patient and family member.  Culture results to dictate further management.  Given the fact that CBC test were normal and most recent B12 was over normal limit I am less suspicious for potential B12 deficiency as cause of oral lesions.  Symptoms and presentation appear most consistent with oral thrush so recommend continued use of Magic mouthwash and will also send in Diflucan  for potential infection.  Return precautions reviewed and provided in after visit summary.  Follow-up as needed    Discharge Instructions      You are seen today for acute mouth pain and concern for tongue lesions.  At this time I suspect your symptoms may be secondary to oral thrush.  This should improve with continued use of the mouthwash that you are prescribed on 05/23/2023.  I have also sent in a medication called Diflucan  for you to take to help with suspected yeast infection.  You can take your first dose today and then if you are still having symptoms in 72 hours you can take the second dose. If your symptoms are not improving or seem to be getting worse over the next 7 days I recommend following up with your primary care office for further evaluation and ongoing management.     ED Prescriptions     Medication Sig Dispense Auth. Provider   fluconazole  (DIFLUCAN ) 150 MG tablet Take 1 tablet (150 mg total) by mouth every three (3) days as needed. May repeat in 3 days if symptoms not resolved 2 tablet Garlen Reinig E, PA-C      PDMP not reviewed this encounter.   Woodford Hayward 05/25/23 1955

## 2023-05-25 NOTE — Discharge Instructions (Addendum)
 You are seen today for acute mouth pain and concern for tongue lesions.  At this time I suspect your symptoms may be secondary to oral thrush.  This should improve with continued use of the mouthwash that you are prescribed on 05/23/2023.  I have also sent in a medication called Diflucan  for you to take to help with suspected yeast infection.  You can take your first dose today and then if you are still having symptoms in 72 hours you can take the second dose. If your symptoms are not improving or seem to be getting worse over the next 7 days I recommend following up with your primary care office for further evaluation and ongoing management.

## 2023-05-25 NOTE — ED Triage Notes (Signed)
 Pt presents with blisters on her tongue and throat x 1 week. She went to see her sister last week and had a sore throat then. She went to the doctor on Monday and was given a mouth wash to use that is not helping.

## 2023-05-30 LAB — CULTURE, GROUP A STREP (THRC)

## 2023-06-14 ENCOUNTER — Ambulatory Visit: Admitting: Behavioral Health

## 2023-06-14 ENCOUNTER — Encounter: Payer: Self-pay | Admitting: Behavioral Health

## 2023-06-14 DIAGNOSIS — F4310 Post-traumatic stress disorder, unspecified: Secondary | ICD-10-CM | POA: Diagnosis not present

## 2023-06-14 DIAGNOSIS — F411 Generalized anxiety disorder: Secondary | ICD-10-CM

## 2023-06-14 DIAGNOSIS — F331 Major depressive disorder, recurrent, moderate: Secondary | ICD-10-CM

## 2023-06-14 NOTE — Progress Notes (Signed)
 [facility]  Patient ID: AMBA LIMONES,  MRN: [id number]  PCP: Adelia Homestead, MD  Date of Evaluation: 06/14/2023 Time spent:30 minutes  Chief Complaint:   HISTORY/CURRENT STATUS: HPI "Anna Lyons", 68 year old female presents to this office for follow up and medication management. Collateral information should be considered reliable. Psychomotor agitation in both legs bilat today.   Reporting minimal improvement with depression. Says she cannot tell much difference but only been on 2 mg dose for one week.   She is currently working at [employer] 2 to 3 days a week for 6 hours/day.  She has been diagnosed with emphysema and stop smoking a couple months ago.   Reports depression today at 4/10, and anxiety at 3/10.  She is sleeping well with the aid of temazepam  15 mg at bedtime.  Her PHQ 9 was score of 20.  Her MDQ was negative.  She denies history of mania, no psychosis, no auditory or visual hallucinations.  No delirium.  No current SI or HI.   Past psychiatric medication trials: Zoloft  Duloxetine  Temazepam  Aripiprazole  Individual Medical History/ Review of Systems: Changes? :No   Allergies: Codeine and Oxycodone -acetaminophen   Current Medications:  Current Outpatient Medications:    aspirin  EC 81 MG tablet, Take 81 mg by mouth daily., Disp: , Rfl:    atorvastatin  (LIPITOR ) 80 MG tablet, Take 1 tablet (80 mg total) by mouth at bedtime., Disp: 90 tablet, Rfl: 3   cholecalciferol (VITAMIN D3) 25 MCG (1000 UNIT) tablet, Take 1,000 Units by mouth daily., Disp: , Rfl:    DULoxetine  (CYMBALTA ) 60 MG capsule, Take 1 capsule (60 mg total) by mouth daily., Disp: 90 capsule, Rfl: 3   ferrous sulfate 324 MG TBEC, Take 324 mg by mouth., Disp: , Rfl:    fluconazole  (DIFLUCAN ) 150 MG tablet, Take 1 tablet (150 mg total) by mouth every three (3) days as needed. May repeat in 3 days if symptoms not resolved, Disp: 2 tablet, Rfl: 0   gabapentin  (NEURONTIN ) 300 MG capsule, Take 1 capsule  (300 mg total) by mouth 2 (two) times daily., Disp: 180 capsule, Rfl: 3   levETIRAcetam  (KEPPRA ) 500 MG tablet, Take 1 tablet (500 mg total) by mouth 2 (two) times daily., Disp: 180 tablet, Rfl: 3   magic mouthwash (nystatin , lidocaine , diphenhydrAMINE, alum & mag hydroxide) suspension, Swish and swallow 5 mLs 4 (four) times daily., Disp: 180 mL, Rfl: 0   Magnesium  250 MG TABS, Take by mouth., Disp: , Rfl:    Multiple Vitamin (MULTIVITAMIN WITH MINERALS) TABS tablet, Take 1 tablet by mouth daily., Disp: , Rfl:    pramipexole  (MIRAPEX ) 0.5 MG tablet, Take 1 tablet (0.5 mg total) by mouth at bedtime as needed., Disp: 90 tablet, Rfl: 3   temazepam  (RESTORIL ) 15 MG capsule, Take 1 capsule (15 mg total) by mouth at bedtime., Disp: 90 capsule, Rfl: 1   vitamin B-12 (CYANOCOBALAMIN ) 1000 MCG tablet, Take 1,000 mcg by mouth daily., Disp: , Rfl:  Medication Side Effects: none  Family Medical/ Social History: Changes? No  MENTAL HEALTH EXAM:  There were no vitals taken for this visit.There is no height or weight on file to calculate BMI.  General Appearance: Casual and Neat  Eye Contact:  Good  Speech:  Clear and Coherent  Volume:  Normal  Mood:  Anxious, Depressed, and Dysphoric  Affect:  Congruent, Depressed, and Anxious  Thought Process:  Coherent  Orientation:  Full (Time, Place, and Person)  Thought Content: Logical   Suicidal Thoughts:  No  Homicidal Thoughts:  No  Memory:  WNL  Judgement:  Good  Insight:  Fair  Psychomotor Activity:  Normal  Concentration:  Concentration: Good  Recall:  Good  Fund of Knowledge: Good  Language: Good  Assets:  Desire for Improvement  ADL's:  Intact  Cognition: WNL  Prognosis:  Good    DIAGNOSES:    ICD-10-CM   1. Moderate episode of recurrent major depressive disorder (HCC)  F33.1     2. PTSD (post-traumatic stress disorder)  F43.10     3. Generalized anxiety disorder  F41.1       Receiving Psychotherapy: No    RECOMMENDATIONS:    Greater than 50% of 30 min  face to face time with patient was spent on counseling and coordination of care. Discussed her minimal improvement since starting [name] . She has just recently titrated up to the 2 mg dose this past week. She agrees to allow the medication more time to work. No additional medication changes today.    We agreed today to: To continue [name]  2 mg daily. Switch to bedtime administration.   May take at bedtime To continue Cymbalta  60 mg daily at bedtime To continue Temazapam 15 mg at bedtime for sleep PCP continue to write Gabapentin  for pain and Levitiracetam for seizures. To follow-up in 4 weeks to reassess Will report worsening symptoms or side effects promptly Provided emergency contact information number Discussed potential metabolic side effects associated with atypical antipsychotics, as well as potential risk for movement side effects. Advised pt to contact office if movement side effects occur.   Reviewed PDMP   Lincoln Renshaw, NP

## 2023-06-24 ENCOUNTER — Other Ambulatory Visit: Payer: Self-pay | Admitting: Internal Medicine

## 2023-06-24 DIAGNOSIS — F331 Major depressive disorder, recurrent, moderate: Secondary | ICD-10-CM

## 2023-06-24 DIAGNOSIS — F333 Major depressive disorder, recurrent, severe with psychotic symptoms: Secondary | ICD-10-CM

## 2023-07-05 DIAGNOSIS — S8001XA Contusion of right knee, initial encounter: Secondary | ICD-10-CM | POA: Diagnosis not present

## 2023-07-05 DIAGNOSIS — W57XXXA Bitten or stung by nonvenomous insect and other nonvenomous arthropods, initial encounter: Secondary | ICD-10-CM | POA: Diagnosis not present

## 2023-07-13 ENCOUNTER — Encounter: Payer: Self-pay | Admitting: Behavioral Health

## 2023-07-13 ENCOUNTER — Ambulatory Visit (INDEPENDENT_AMBULATORY_CARE_PROVIDER_SITE_OTHER): Admitting: Behavioral Health

## 2023-07-13 DIAGNOSIS — F331 Major depressive disorder, recurrent, moderate: Secondary | ICD-10-CM | POA: Diagnosis not present

## 2023-07-13 DIAGNOSIS — F411 Generalized anxiety disorder: Secondary | ICD-10-CM | POA: Diagnosis not present

## 2023-07-13 DIAGNOSIS — F4310 Post-traumatic stress disorder, unspecified: Secondary | ICD-10-CM | POA: Diagnosis not present

## 2023-07-13 MED ORDER — BREXPIPRAZOLE 1 MG PO TABS: 1.0000 mg | ORAL_TABLET | Freq: Every day | ORAL | 2 refills | Status: DC

## 2023-07-13 MED ORDER — BUPROPION HCL ER (XL) 150 MG PO TB24: 150.0000 mg | ORAL_TABLET | Freq: Every day | ORAL | 1 refills | Status: DC

## 2023-07-13 NOTE — Progress Notes (Signed)
 [facility]  Patient ID: Anna Lyons,  MRN: [id number]  PCP: Adelia Homestead, MD  Date of Evaluation: 07/13/2023 Time spent:30 minutes  Chief Complaint:  Chief Complaint   Follow-up; Anxiety; Depression; Medication Refill; Patient Education; Medication Problem     HISTORY/CURRENT STATUS: HPI Anna Lyons, 68 year old female presents to this office for follow up and medication management. Collateral information should be considered reliable. Much calmer today but still complains of fatigue and sleepiness. Believes [name]  makes it work. Also believes Gabapentin  effecting her.  She is currently working at [employer] 2 to 3 days a week for 6 hours/day.  She has been diagnosed with emphysema and stop smoking a couple months ago. Reports depression today at 5/10, and anxiety at 3/10.  She is sleeping well with the aid of temazepam  15 mg at bedtime.   She denies history of mania, no psychosis, no auditory or visual hallucinations.  No delirium.  No current SI or HI.   Past psychiatric medication trials: Zoloft  Duloxetine  Temazepam  Aripiprazole  Individual Medical History/ Review of Systems: Changes? :No   Allergies: Codeine and Oxycodone -acetaminophen   Current Medications:  Current Outpatient Medications:    aspirin  EC 81 MG tablet, Take 81 mg by mouth daily., Disp: , Rfl:    atorvastatin  (LIPITOR ) 80 MG tablet, Take 1 tablet (80 mg total) by mouth at bedtime., Disp: 90 tablet, Rfl: 3   cholecalciferol (VITAMIN D3) 25 MCG (1000 UNIT) tablet, Take 1,000 Units by mouth daily., Disp: , Rfl:    DULoxetine  (CYMBALTA ) 60 MG capsule, Take 1 capsule (60 mg total) by mouth daily., Disp: 90 capsule, Rfl: 3   ferrous sulfate 324 MG TBEC, Take 324 mg by mouth., Disp: , Rfl:    fluconazole  (DIFLUCAN ) 150 MG tablet, Take 1 tablet (150 mg total) by mouth every three (3) days as needed. May repeat in 3 days if symptoms not resolved, Disp: 2 tablet, Rfl: 0   gabapentin  (NEURONTIN ) 300 MG  capsule, Take 1 capsule (300 mg total) by mouth 2 (two) times daily., Disp: 180 capsule, Rfl: 3   levETIRAcetam  (KEPPRA ) 500 MG tablet, Take 1 tablet (500 mg total) by mouth 2 (two) times daily., Disp: 180 tablet, Rfl: 3   magic mouthwash (nystatin , lidocaine , diphenhydrAMINE, alum & mag hydroxide) suspension, Swish and swallow 5 mLs 4 (four) times daily., Disp: 180 mL, Rfl: 0   Magnesium  250 MG TABS, Take by mouth., Disp: , Rfl:    Multiple Vitamin (MULTIVITAMIN WITH MINERALS) TABS tablet, Take 1 tablet by mouth daily., Disp: , Rfl:    pramipexole  (MIRAPEX ) 0.5 MG tablet, Take 1 tablet (0.5 mg total) by mouth at bedtime as needed., Disp: 90 tablet, Rfl: 3   temazepam  (RESTORIL ) 15 MG capsule, Take 1 capsule (15 mg total) by mouth at bedtime., Disp: 90 capsule, Rfl: 1   vitamin B-12 (CYANOCOBALAMIN ) 1000 MCG tablet, Take 1,000 mcg by mouth daily., Disp: , Rfl:  Medication Side Effects: none  Family Medical/ Social History: Changes? No  MENTAL HEALTH EXAM:  There were no vitals taken for this visit.There is no height or weight on file to calculate BMI.  General Appearance: Casual and Well Groomed  Eye Contact:  Good  Speech:  Clear and Coherent  Volume:  Normal  Mood:  NA  Affect:  Appropriate  Thought Process:  Coherent  Orientation:  Full (Time, Place, and Person)  Thought Content: Logical   Suicidal Thoughts:  No  Homicidal Thoughts:  No  Memory:  WNL  Judgement:  Good  Insight:  Good  Psychomotor Activity:  Normal  Concentration:  Concentration: Good  Recall:  Good  Fund of Knowledge: Good  Language: Good  Assets:  Desire for Improvement  ADL's:  Intact  Cognition: WNL  Prognosis:  Good    DIAGNOSES:    ICD-10-CM   1. Moderate episode of recurrent major depressive disorder (HCC)  F33.1     2. PTSD (post-traumatic stress disorder)  F43.10     3. Generalized anxiety disorder  F41.1       Receiving Psychotherapy: No    RECOMMENDATIONS:   Greater than 50% of 30  min  face to face time with patient was spent on counseling and coordination of care. Discussed continued sleepiness and fatigue. She expressed concerns that Gabapentin  may be adding to the problem.   We agreed today to: To reduced [name]  1mg  daily.  To continue Cymbalta  60 mg daily at bedtime To continue Temazapam 15 mg at bedtime for sleep Reduced Gabapentin  to 300 mg daily  Wellbutrin  150 mg XR in the am after breakfast. To follow-up in 4 weeks to reassess Will report worsening symptoms or side effects promptly Provided emergency contact information number Discussed potential metabolic side effects associated with atypical antipsychotics, as well as potential risk for movement side effects. Advised pt to contact office if movement side effects occur.   Reviewed PDMP   Anna Renshaw, NP

## 2023-07-14 ENCOUNTER — Other Ambulatory Visit: Payer: Self-pay | Admitting: Internal Medicine

## 2023-07-15 ENCOUNTER — Other Ambulatory Visit: Payer: Self-pay | Admitting: Behavioral Health

## 2023-07-15 NOTE — Telephone Encounter (Signed)
 Zonia called and when she picked up [name]  1 mg at [facility] pharmacy the price was $479.00. She said she couldn't afford this. She has no [name]  left. Pharmacy is   [facility] [address], [place] - [address]   Phone: [phone]  Fax: [phone]    Her phone number is [phone]. Pharmacy hasn't sent a prior authorization form yet.

## 2023-07-15 NOTE — Telephone Encounter (Signed)
 Called pharmacy and PA not needed, just high copay.

## 2023-07-17 NOTE — Telephone Encounter (Signed)
 Does pt know where she is at with her $2000.00 requirement, is she close to that being met?

## 2023-07-18 NOTE — Telephone Encounter (Addendum)
 Pt called back. Not sure how much she has paid on $2000. She can't afford this med. Would like to try something she can afford. RTC [phone]

## 2023-07-19 NOTE — Telephone Encounter (Signed)
 Patient said she has a card from [facility] and has not been having to pay for her medications. She doesn't know where she is at in the $2000 and doesn't want to call insurance company. She is asking for another medication in place of [name] .

## 2023-07-20 NOTE — Telephone Encounter (Signed)
 Please see message from patient regarding [name] .

## 2023-07-21 MED ORDER — ARIPIPRAZOLE 5 MG PO TABS: 2.5000 mg | ORAL_TABLET | Freq: Every day | ORAL | 0 refills | Status: DC

## 2023-08-01 ENCOUNTER — Encounter: Payer: Self-pay | Admitting: Family Medicine

## 2023-08-01 ENCOUNTER — Encounter (INDEPENDENT_AMBULATORY_CARE_PROVIDER_SITE_OTHER): Payer: Self-pay

## 2023-08-01 ENCOUNTER — Ambulatory Visit (INDEPENDENT_AMBULATORY_CARE_PROVIDER_SITE_OTHER): Admitting: Family Medicine

## 2023-08-01 VITALS — BP 112/78 | HR 71 | Temp 98.1°F | Resp 16 | Ht 62.0 in | Wt 127.0 lb

## 2023-08-01 DIAGNOSIS — K148 Other diseases of tongue: Secondary | ICD-10-CM

## 2023-08-01 NOTE — Progress Notes (Signed)
 Assessment & Plan:  1. Tongue lesion (Primary) Patient has failed all previous treatments. Sending to ENT for possible biopsy.  - Ambulatory referral to ENT   Follow up plan: Return if symptoms worsen or fail to improve.  Niki Rung, MSN, APRN, FNP-C  Subjective:  HPI: Anna Lyons is a 68 y.o. female presenting on 08/01/2023 for Sore Throat (Sores on tongue, gums and throat x 1 month. Gargling with salt water- no improvement. Denies fever. )  Patient reports sores on her tongue and a sore throat that started two and a half months ago.She was previously seen here on 05/23/2023 and by urgent care on 05/25/2023. At home she has tried Biotin mouthwash, Ibuprofen , Halls cough drops, salt water gargles, and Orajel. She has been prescribed Magic Mouthwash and Diflucan . Nothing has improved her symptoms. She states it is hard to eat and drink because of the pain she is experiencing.    ROS: Negative unless specifically indicated above in HPI.   Relevant past medical history reviewed and updated as indicated.   Allergies and medications reviewed and updated.   Current Outpatient Medications:    ARIPiprazole  (ABILIFY ) 5 MG tablet, Take 0.5 tablets (2.5 mg total) by mouth daily., Disp: 30 tablet, Rfl: 0   ARIPiprazole  (ABILIFY ) 5 MG tablet, Take 2.5 mg by mouth daily., Disp: , Rfl:    aspirin  EC 81 MG tablet, Take 81 mg by mouth daily., Disp: , Rfl:    atorvastatin  (LIPITOR ) 80 MG tablet, Take 1 tablet (80 mg total) by mouth at bedtime., Disp: 90 tablet, Rfl: 3   brexpiprazole  ([name] ) 1 MG TABS tablet, Take 1 tablet (1 mg total) by mouth daily., Disp: 30 tablet, Rfl: 2   buPROPion  (WELLBUTRIN  XL) 150 MG 24 hr tablet, Take 1 tablet (150 mg total) by mouth daily., Disp: 30 tablet, Rfl: 1   cholecalciferol (VITAMIN D3) 25 MCG (1000 UNIT) tablet, Take 1,000 Units by mouth daily., Disp: , Rfl:    DULoxetine  (CYMBALTA ) 60 MG capsule, Take 1 capsule (60 mg total) by mouth daily., Disp: 90  capsule, Rfl: 3   ferrous sulfate 324 MG TBEC, Take 324 mg by mouth., Disp: , Rfl:    gabapentin  (NEURONTIN ) 300 MG capsule, Take 1 capsule (300 mg total) by mouth 2 (two) times daily. (Patient taking differently: Take 300 mg by mouth daily.), Disp: 180 capsule, Rfl: 3   levETIRAcetam  (KEPPRA ) 500 MG tablet, Take 1 tablet (500 mg total) by mouth 2 (two) times daily., Disp: 180 tablet, Rfl: 3   Magnesium  250 MG TABS, Take by mouth., Disp: , Rfl:    Multiple Vitamin (MULTIVITAMIN WITH MINERALS) TABS tablet, Take 1 tablet by mouth daily., Disp: , Rfl:    pramipexole  (MIRAPEX ) 0.5 MG tablet, TAKE 1 TABLET BY MOUTH AT BEDTIME AS NEEDED, Disp: 90 tablet, Rfl: 0   temazepam  (RESTORIL ) 15 MG capsule, Take 1 capsule (15 mg total) by mouth at bedtime., Disp: 90 capsule, Rfl: 1   vitamin B-12 (CYANOCOBALAMIN ) 1000 MCG tablet, Take 1,000 mcg by mouth daily., Disp: , Rfl:    fluconazole  (DIFLUCAN ) 150 MG tablet, Take 1 tablet (150 mg total) by mouth every three (3) days as needed. May repeat in 3 days if symptoms not resolved, Disp: 2 tablet, Rfl: 0   magic mouthwash (nystatin , lidocaine , diphenhydrAMINE, alum & mag hydroxide) suspension, Swish and swallow 5 mLs 4 (four) times daily. (Patient not taking: Reported on 08/01/2023), Disp: 180 mL, Rfl: 0  Allergies  Allergen Reactions   Codeine  Itching   Oxycodone -Acetaminophen  Hives    Objective:   BP 112/78   Pulse 71   Temp 98.1 F (36.7 C)   Resp 16   Ht 5' 2 (1.575 m)   Wt 127 lb (57.6 kg)   SpO2 97%   BMI 23.23 kg/m    Physical Exam Vitals reviewed.  Constitutional:      General: She is not in acute distress.    Appearance: Normal appearance. She is not ill-appearing, toxic-appearing or diaphoretic.  HENT:     Head: Normocephalic and atraumatic.     Mouth/Throat:     Tongue: Lesions (one on the tip and one on one top left) present.   Eyes:     General: No scleral icterus.       Right eye: No discharge.        Left eye: No discharge.      Conjunctiva/sclera: Conjunctivae normal.    Cardiovascular:     Rate and Rhythm: Normal rate.  Pulmonary:     Effort: Pulmonary effort is normal. No respiratory distress.   Musculoskeletal:        General: Normal range of motion.     Cervical back: Normal range of motion.   Skin:    General: Skin is warm and dry.     Capillary Refill: Capillary refill takes less than 2 seconds.   Neurological:     General: No focal deficit present.     Mental Status: She is alert and oriented to person, place, and time. Mental status is at baseline.   Psychiatric:        Mood and Affect: Mood normal.        Behavior: Behavior normal.        Thought Content: Thought content normal.        Judgment: Judgment normal.

## 2023-08-16 DIAGNOSIS — H52223 Regular astigmatism, bilateral: Secondary | ICD-10-CM | POA: Diagnosis not present

## 2023-08-16 DIAGNOSIS — H524 Presbyopia: Secondary | ICD-10-CM | POA: Diagnosis not present

## 2023-08-16 DIAGNOSIS — H5203 Hypermetropia, bilateral: Secondary | ICD-10-CM | POA: Diagnosis not present

## 2023-08-16 DIAGNOSIS — H2513 Age-related nuclear cataract, bilateral: Secondary | ICD-10-CM | POA: Diagnosis not present

## 2023-08-16 DIAGNOSIS — H353131 Nonexudative age-related macular degeneration, bilateral, early dry stage: Secondary | ICD-10-CM | POA: Diagnosis not present

## 2023-08-24 ENCOUNTER — Ambulatory Visit (INDEPENDENT_AMBULATORY_CARE_PROVIDER_SITE_OTHER): Admitting: Behavioral Health

## 2023-08-24 ENCOUNTER — Encounter: Payer: Self-pay | Admitting: Behavioral Health

## 2023-08-24 DIAGNOSIS — F332 Major depressive disorder, recurrent severe without psychotic features: Secondary | ICD-10-CM | POA: Diagnosis not present

## 2023-08-24 DIAGNOSIS — F411 Generalized anxiety disorder: Secondary | ICD-10-CM | POA: Diagnosis not present

## 2023-08-24 DIAGNOSIS — F331 Major depressive disorder, recurrent, moderate: Secondary | ICD-10-CM

## 2023-08-24 DIAGNOSIS — F4310 Post-traumatic stress disorder, unspecified: Secondary | ICD-10-CM | POA: Diagnosis not present

## 2023-08-24 MED ORDER — ESCITALOPRAM OXALATE 10 MG PO TABS: 10.0000 mg | ORAL_TABLET | Freq: Every day | ORAL | 1 refills | Status: DC

## 2023-08-24 MED ORDER — BUPROPION HCL ER (XL) 150 MG PO TB24: 150.0000 mg | ORAL_TABLET | Freq: Every day | ORAL | 1 refills | Status: DC

## 2023-08-24 NOTE — Progress Notes (Signed)
 [facility]  Patient ID: Anna Lyons,  MRN: [id number]  PCP: Rollene Almarie LABOR, MD  Date of Evaluation: 08/24/2023 Time spent:30 minutes  Chief Complaint:   HISTORY/CURRENT STATUS: HPI Anna Lyons, 68 year old female presents to this office for follow up and medication management. Collateral information should be considered reliable. Very tearful and distressed today. Says her medication are not working and that she does not want to live anymore. Feels that she has never got over grieving after death of husband 11 years ago and death of her dog. She says that she is not suicidal and would not harm self. She would just be happy if the Anna Lyons took her. Says her sister has had good success with Lexapro  and she wants to make medication changes today.  She is currently working at [employer] 2 to 3 days a week for 6 hours/day.  She has been diagnosed with emphysema and stop smoking a couple months ago. Reports depression today at 8/10, and anxiety at 3/10.  She is sleeping well with the aid of temazepam  15 mg at bedtime.   She denies history of mania, no psychosis, no auditory or visual hallucinations.  No delirium.  No current SI or HI.  Verbally contracts for safety and says that she would never harm self. Strong family support.   Past psychiatric medication trials: Zoloft  Duloxetine  Temazepam  Aripiprazole  [name]    Individual Medical History/ Review of Systems: Changes? :No   Allergies: Codeine and Oxycodone -acetaminophen   Current Medications:  Current Outpatient Medications:    escitalopram  (LEXAPRO ) 10 MG tablet, Take 1 tablet (10 mg total) by mouth daily., Disp: 30 tablet, Rfl: 1   aspirin  EC 81 MG tablet, Take 81 mg by mouth daily., Disp: , Rfl:    atorvastatin  (LIPITOR ) 80 MG tablet, Take 1 tablet (80 mg total) by mouth at bedtime., Disp: 90 tablet, Rfl: 3   brexpiprazole  ([name] ) 1 MG TABS tablet, Take 1 tablet (1 mg total) by mouth daily., Disp: 30 tablet, Rfl: 2    buPROPion  (WELLBUTRIN  XL) 150 MG 24 hr tablet, Take 1 tablet (150 mg total) by mouth daily., Disp: 30 tablet, Rfl: 1   cholecalciferol (VITAMIN D3) 25 MCG (1000 UNIT) tablet, Take 1,000 Units by mouth daily., Disp: , Rfl:    DULoxetine  (CYMBALTA ) 60 MG capsule, Take 1 capsule (60 mg total) by mouth daily., Disp: 90 capsule, Rfl: 3   ferrous sulfate 324 MG TBEC, Take 324 mg by mouth., Disp: , Rfl:    gabapentin  (NEURONTIN ) 300 MG capsule, Take 1 capsule (300 mg total) by mouth 2 (two) times daily. (Patient taking differently: Take 300 mg by mouth daily.), Disp: 180 capsule, Rfl: 3   levETIRAcetam  (KEPPRA ) 500 MG tablet, Take 1 tablet (500 mg total) by mouth 2 (two) times daily., Disp: 180 tablet, Rfl: 3   Magnesium  250 MG TABS, Take by mouth., Disp: , Rfl:    Multiple Vitamin (MULTIVITAMIN WITH MINERALS) TABS tablet, Take 1 tablet by mouth daily., Disp: , Rfl:    pramipexole  (MIRAPEX ) 0.5 MG tablet, TAKE 1 TABLET BY MOUTH AT BEDTIME AS NEEDED, Disp: 90 tablet, Rfl: 0   temazepam  (RESTORIL ) 15 MG capsule, Take 1 capsule (15 mg total) by mouth at bedtime., Disp: 90 capsule, Rfl: 1   vitamin B-12 (CYANOCOBALAMIN ) 1000 MCG tablet, Take 1,000 mcg by mouth daily., Disp: , Rfl:  Medication Side Effects: none  Family Medical/ Social History: Changes? No  MENTAL HEALTH EXAM:  There were no vitals taken for this visit.There is no  height or weight on file to calculate BMI.  General Appearance: Casual and Neat  Eye Contact:  Good  Speech:  difficult to understand at times  Volume:  Decreased  Mood:  Anxious, Depressed, and Dysphoric  Affect:  Congruent, Depressed, and Anxious  Thought Process:  Coherent  Orientation:  Full (Time, Place, and Person)  Thought Content: Logical   Suicidal Thoughts:  No  Homicidal Thoughts:  No  Memory:  WNL  Judgement:  Good  Insight:  Fair  Psychomotor Activity:  Normal  Concentration:  Concentration: Fair  Recall:  [employer] of Knowledge: Fair  Language: Fair   Assets:  Desire for Improvement  ADL's:  Intact  Cognition: WNL  Prognosis:  Good    DIAGNOSES:    ICD-10-CM   1. Severe episode of recurrent major depressive disorder, without psychotic features (HCC)  F33.2 escitalopram  (LEXAPRO ) 10 MG tablet    2. Generalized anxiety disorder  F41.1 escitalopram  (LEXAPRO ) 10 MG tablet    buPROPion  (WELLBUTRIN  XL) 150 MG 24 hr tablet    3. PTSD (post-traumatic stress disorder)  F43.10 escitalopram  (LEXAPRO ) 10 MG tablet    buPROPion  (WELLBUTRIN  XL) 150 MG 24 hr tablet    4. Moderate episode of recurrent major depressive disorder (HCC)  F33.1 buPROPion  (WELLBUTRIN  XL) 150 MG 24 hr tablet      Receiving Psychotherapy: No    RECOMMENDATIONS:  Greater than 50% of 30 min  face to face time with patient was spent on counseling and coordination of care. Pt is very tearful. Continues to complain that medications are not working to help depression. We reviewed her medication and she is very confused about her meds even though using a pill organizer.  She was not sure she Dc'd Abilify  as instructed when switching to [name] .   I wrote down detailed instruction on meds this visit.     We agreed today to: To reduce [name]  to 0.5 mg daily for one week and then stop. Make sure she is not taking Abilify  and dispose of properly at pharmacy To reduce Cymbalta  60 mg weekly by 20 mg until finished To start Lexapro  10 mg daily after breakfast To continue Temazapam 15 mg at bedtime for sleep Reduced Gabapentin  to 300 mg daily  Wellbutrin  150 mg XR in the am after breakfast. To follow-up in 4 weeks to reassess Will report worsening symptoms or side effects promptly Provided emergency contact information number Discussed potential metabolic side effects associated with atypical antipsychotics, as well as potential risk for movement side effects. Advised pt to contact office if movement side effects occur.   Reviewed PDMP       [name], NP

## 2023-09-16 ENCOUNTER — Other Ambulatory Visit: Payer: Self-pay | Admitting: Behavioral Health

## 2023-09-23 ENCOUNTER — Ambulatory Visit: Admitting: Behavioral Health

## 2023-09-23 ENCOUNTER — Encounter: Payer: Self-pay | Admitting: Behavioral Health

## 2023-09-23 DIAGNOSIS — F332 Major depressive disorder, recurrent severe without psychotic features: Secondary | ICD-10-CM

## 2023-09-23 DIAGNOSIS — F4310 Post-traumatic stress disorder, unspecified: Secondary | ICD-10-CM | POA: Diagnosis not present

## 2023-09-23 DIAGNOSIS — F411 Generalized anxiety disorder: Secondary | ICD-10-CM

## 2023-09-23 MED ORDER — BUPROPION HCL ER (XL) 300 MG PO TB24: 300.0000 mg | ORAL_TABLET | Freq: Every day | ORAL | 2 refills | Status: DC

## 2023-09-23 MED ORDER — ESCITALOPRAM OXALATE 20 MG PO TABS: 20.0000 mg | ORAL_TABLET | Freq: Every day | ORAL | 2 refills | Status: DC

## 2023-09-23 NOTE — Progress Notes (Signed)
 [facility]  Patient ID: Anna Lyons,  MRN: [id number]  PCP: Rollene Almarie LABOR, MD  Date of Evaluation: 09/23/2023 Time spent:30 minutes  Chief Complaint:  Chief Complaint   Anxiety; Depression; Follow-up; Patient Education; Stress     HISTORY/CURRENT STATUS: HPI Anna Lyons, 68 year old female presents to this office for follow up and medication management. Collateral information should be considered reliable. Not as tearful, more calm today but reports not much change since changing and adjusting her medications.  Reports depression today at 6/10, and anxiety at 3/10.  She is sleeping well with the aid of temazepam  15 mg at bedtime.   She denies history of mania, no psychosis, no auditory or visual hallucinations.  No delirium.  No current SI or HI.  Verbally contracts for safety and says that she would never harm self. Strong family support.   Past psychiatric medication trials:  Zoloft  Duloxetine  Temazepam  Aripiprazole  [name]       Individual Medical History/ Review of Systems: Changes? :No   Allergies: Codeine and Oxycodone -acetaminophen   Current Medications:  Current Outpatient Medications:    buPROPion  (WELLBUTRIN  XL) 300 MG 24 hr tablet, Take 1 tablet (300 mg total) by mouth daily., Disp: 30 tablet, Rfl: 2   escitalopram  (LEXAPRO ) 20 MG tablet, Take 1 tablet (20 mg total) by mouth daily., Disp: 30 tablet, Rfl: 2   aspirin  EC 81 MG tablet, Take 81 mg by mouth daily., Disp: , Rfl:    atorvastatin  (LIPITOR ) 80 MG tablet, Take 1 tablet (80 mg total) by mouth at bedtime., Disp: 90 tablet, Rfl: 3   brexpiprazole  ([name] ) 1 MG TABS tablet, Take 1 tablet (1 mg total) by mouth daily., Disp: 30 tablet, Rfl: 2   buPROPion  (WELLBUTRIN  XL) 150 MG 24 hr tablet, Take 1 tablet (150 mg total) by mouth daily., Disp: 30 tablet, Rfl: 1   cholecalciferol (VITAMIN D3) 25 MCG (1000 UNIT) tablet, Take 1,000 Units by mouth daily., Disp: , Rfl:    DULoxetine  (CYMBALTA ) 60 MG  capsule, Take 1 capsule (60 mg total) by mouth daily., Disp: 90 capsule, Rfl: 3   escitalopram  (LEXAPRO ) 10 MG tablet, Take 1 tablet (10 mg total) by mouth daily., Disp: 30 tablet, Rfl: 1   ferrous sulfate 324 MG TBEC, Take 324 mg by mouth., Disp: , Rfl:    gabapentin  (NEURONTIN ) 300 MG capsule, Take 1 capsule (300 mg total) by mouth 2 (two) times daily. (Patient taking differently: Take 300 mg by mouth daily.), Disp: 180 capsule, Rfl: 3   levETIRAcetam  (KEPPRA ) 500 MG tablet, Take 1 tablet (500 mg total) by mouth 2 (two) times daily., Disp: 180 tablet, Rfl: 3   Magnesium  250 MG TABS, Take by mouth., Disp: , Rfl:    Multiple Vitamin (MULTIVITAMIN WITH MINERALS) TABS tablet, Take 1 tablet by mouth daily., Disp: , Rfl:    pramipexole  (MIRAPEX ) 0.5 MG tablet, TAKE 1 TABLET BY MOUTH AT BEDTIME AS NEEDED, Disp: 90 tablet, Rfl: 0   temazepam  (RESTORIL ) 15 MG capsule, Take 1 capsule (15 mg total) by mouth at bedtime., Disp: 90 capsule, Rfl: 1   vitamin B-12 (CYANOCOBALAMIN ) 1000 MCG tablet, Take 1,000 mcg by mouth daily., Disp: , Rfl:  Medication Side Effects: none  Family Medical/ Social History: Changes? No  MENTAL HEALTH EXAM:  There were no vitals taken for this visit.There is no height or weight on file to calculate BMI.  General Appearance: Casual, Neat, and Well Groomed  Eye Contact:  NA  Speech:  Clear and Coherent  Volume:  Normal  Mood:  Anxious, Depressed, and Dysphoric  Affect:  Congruent, Depressed, Flat, Tearful, and Anxious  Thought Process:  Coherent  Orientation:  Full (Time, Place, and Person)  Thought Content: Logical   Suicidal Thoughts:  No  Homicidal Thoughts:  No  Memory:  WNL  Judgement:  Good  Insight:  Good  Psychomotor Activity:  Normal  Concentration:  Concentration: Good  Recall:  Good  Fund of Knowledge: Good  Language: Good  Assets:  Desire for Improvement  ADL's:  Intact  Cognition: WNL  Prognosis:  Good    DIAGNOSES:    ICD-10-CM   1. Generalized  anxiety disorder  F41.1 escitalopram  (LEXAPRO ) 20 MG tablet    buPROPion  (WELLBUTRIN  XL) 300 MG 24 hr tablet    2. Severe episode of recurrent major depressive disorder, without psychotic features (HCC)  F33.2 escitalopram  (LEXAPRO ) 20 MG tablet    buPROPion  (WELLBUTRIN  XL) 300 MG 24 hr tablet    3. PTSD (post-traumatic stress disorder)  F43.10       Receiving Psychotherapy: No    RECOMMENDATIONS:   Greater than 50% of 30 min  face to face time with patient was spent on counseling and coordination of care. Not as tearful this visit but still appears distressed. We discussed her minimal improvement since last visit.  She is open to further adjusting her medications today.     We agreed today to:  To increase Lexapro  20  mg daily after breakfast To continue Temazapam 15 mg at bedtime for sleep Continue Gabapentin  to 300 mg daily  Increase Wellbutrin  to 300 mg XR in the am after breakfast. To follow-up in 6 weeks to reassess Will report worsening symptoms or side effects promptly Provided emergency contact information number Discussed potential metabolic side effects associated with atypical antipsychotics, as well as potential risk for movement side effects. Advised pt to contact office if movement side effects occur.   Reviewed PDMP      [name], NP

## 2023-09-26 DIAGNOSIS — F4381 Prolonged grief disorder: Secondary | ICD-10-CM | POA: Diagnosis not present

## 2023-10-04 DIAGNOSIS — F4381 Prolonged grief disorder: Secondary | ICD-10-CM | POA: Diagnosis not present

## 2023-10-06 ENCOUNTER — Institutional Professional Consult (permissible substitution) (INDEPENDENT_AMBULATORY_CARE_PROVIDER_SITE_OTHER): Admitting: Otolaryngology

## 2023-10-09 ENCOUNTER — Other Ambulatory Visit: Payer: Self-pay | Admitting: Internal Medicine

## 2023-10-17 ENCOUNTER — Encounter: Payer: Self-pay | Admitting: Family Medicine

## 2023-10-17 ENCOUNTER — Ambulatory Visit (INDEPENDENT_AMBULATORY_CARE_PROVIDER_SITE_OTHER): Admitting: Family Medicine

## 2023-10-17 ENCOUNTER — Ambulatory Visit: Payer: Self-pay

## 2023-10-17 ENCOUNTER — Ambulatory Visit (INDEPENDENT_AMBULATORY_CARE_PROVIDER_SITE_OTHER)

## 2023-10-17 VITALS — BP 132/78 | HR 64 | Temp 97.9°F | Resp 20 | Ht 62.0 in | Wt 125.0 lb

## 2023-10-17 DIAGNOSIS — I69351 Hemiplegia and hemiparesis following cerebral infarction affecting right dominant side: Secondary | ICD-10-CM

## 2023-10-17 DIAGNOSIS — R638 Other symptoms and signs concerning food and fluid intake: Secondary | ICD-10-CM

## 2023-10-17 DIAGNOSIS — R296 Repeated falls: Secondary | ICD-10-CM

## 2023-10-17 DIAGNOSIS — G2581 Restless legs syndrome: Secondary | ICD-10-CM

## 2023-10-17 DIAGNOSIS — R0781 Pleurodynia: Secondary | ICD-10-CM | POA: Diagnosis not present

## 2023-10-17 DIAGNOSIS — E538 Deficiency of other specified B group vitamins: Secondary | ICD-10-CM

## 2023-10-17 DIAGNOSIS — S0990XA Unspecified injury of head, initial encounter: Secondary | ICD-10-CM

## 2023-10-17 DIAGNOSIS — F333 Major depressive disorder, recurrent, severe with psychotic symptoms: Secondary | ICD-10-CM

## 2023-10-17 DIAGNOSIS — R531 Weakness: Secondary | ICD-10-CM | POA: Diagnosis not present

## 2023-10-17 DIAGNOSIS — F4381 Prolonged grief disorder: Secondary | ICD-10-CM | POA: Diagnosis not present

## 2023-10-17 DIAGNOSIS — R682 Dry mouth, unspecified: Secondary | ICD-10-CM

## 2023-10-17 DIAGNOSIS — R2681 Unsteadiness on feet: Secondary | ICD-10-CM | POA: Diagnosis not present

## 2023-10-17 DIAGNOSIS — G44311 Acute post-traumatic headache, intractable: Secondary | ICD-10-CM | POA: Diagnosis not present

## 2023-10-17 NOTE — Telephone Encounter (Signed)
 FYI Only or Action Required?: FYI only for provider.  Patient was last seen in primary care on 08/01/2023 by Merlynn Niki FALCON, FNP.  Called Nurse Triage reporting Fall.  Symptoms began a week ago.  Interventions attempted: OTC medications: aleve  and Prescription medications: vertigo rx - unsure of name.  Symptoms are: gradually worsening.  Triage Disposition: See Physician Within 24 Hours  Patient/caregiver understands and will follow disposition?: Yes     Copied from CRM #[id number]. Topic: Clinical - Red Word Triage >> Oct 17, 2023  8:45 AM Ahlexyia S wrote: Red Word that prompted transfer to Nurse Triage: Pt stated that she has been falling very repeatedly. Pt stated she fell 3 times yesterday and has been having some dizziness.Warm transferred to nurse triage. Reason for Disposition  [1] MODERATE weakness (e.g., interferes with work, school, normal activities) AND [2] new-onset or getting worse  Answer Assessment - Initial Assessment Questions 1. MECHANISM: How did the fall happen?     Pt reports vertigo 2. DOMESTIC VIOLENCE AND ELDER ABUSE SCREENING: Did you fall because someone pushed you or tried to hurt you? If Yes, ask: Are you safe now?     N/a 3. ONSET: When did the fall happen? (e.g., minutes, hours, or days ago)     1 week ago 4. LOCATION: What part of the body hit the ground? (e.g., back, buttocks, head, hips, knees, hands, head, stomach)     Back and hips, and head Endorses taking ASA 5. INJURY: Did you hurt (injure) yourself when you fell? If Yes, ask: What did you injure? Tell me more about this? (e.g., body area; type of injury; pain severity)     Endorses bruise on elbow 6. PAIN: Is there any pain? If Yes, ask: How bad is the pain? (e.g., Scale 0-10; or none, mild,      8/10 Alleve with minimal relief 7. SIZE: For cuts, bruises, or swelling, ask: How large is it? (e.g., inches or centimeters)      *No Answer* 8. PREGNANCY: Is there any  chance you are pregnant? When was your last menstrual period?     N/a 9. OTHER SYMPTOMS: Do you have any other symptoms? (e.g., dizziness, fever, weakness; new-onset or worsening).      denies 10. CAUSE: What do you think caused the fall (or falling)? (e.g., dizzy spell, tripped)       Dizziness/vertigo  Protocols used: Falls and [facility]

## 2023-10-17 NOTE — Assessment & Plan Note (Signed)
 Orders:    Vitamin B12; Future

## 2023-10-17 NOTE — Progress Notes (Signed)
 Assessment & Plan Intractable acute post-traumatic headache Concern for intracranial bleed due to head trauma and persistent headache. - Refer to emergency department for head CT. - Perform blood work and urinalysis before hospital visit.    Traumatic injury of head, initial encounter Concern for intracranial bleed due to head trauma and persistent headache. - Refer to emergency department for head CT. - Perform blood work and urinalysis before hospital visit.    Frequent falls Concern for intracranial bleed due to head trauma and persistent headache. - Refer to emergency department for head CT. - Perform blood work and urinalysis before hospital visit. Orders:   CBC with Differential/Platelet; Future   Comprehensive metabolic panel with GFR; Future   TSH; Future   Vitamin B12; Future   Magnesium ; Future   Ferritin; Future   Urinalysis; Future   Ambulatory referral to Physical Therapy  Unsteadiness Unsteady gait and right leg weakness likely related to previous stroke. - Recommend initiation of physical therapy. Orders:   Ambulatory referral to Physical Therapy  Weakness Unsteady gait and right leg weakness likely related to previous stroke. - Recommend initiation of physical therapy. 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 - Recommend initiation of physical therapy. Orders:   Ambulatory referral to Physical Therapy  Rib pain on right side Rib pain with concern for possible fracture. Orders:   DG Ribs Unilateral Right; Future  RLS (restless legs syndrome) Increased symptoms with significant discomfort during rest and sleep. Orders:   CBC with Differential/Platelet; Future   Comprehensive metabolic panel with GFR; Future   TSH; Future   Vitamin B12; Future   Magnesium ; Future   Ferritin;  Future  B12 deficiency  Orders:   Vitamin B12; Future  Dry mouth Possibly related to medication use, dehydration, or vaping. - Encouraged to drink more water. Goal is 8 8oz glasses per day. - Try Biotene mouthwash/spray    Decreased oral intake Possibly due to lack of appetite or motivation to prepare meals when alone. Social factors may contribute. - Explore options for Meals on Wheels for regular meal delivery.    Severe episode of recurrent major depressive disorder, with psychotic features (HCC) Medication review and possible inappropriate use of duloxetine  (Cymbalta ) Potential inappropriate use of duloxetine . Medication review needed. - Review medication list to confirm current medications and dosages.      Follow up plan: upon discharge  Niki Rung, MSN, APRN, FNP-C  Subjective:  HPI: Anna Lyons is a 68 y.o. female presenting on 10/17/2023 for Fall (Had 3 falls in a few days - unsteady, hx of strokes, right leg doesn't move as well now (this is worse recently) /Light-headed at times. Kary get weak after standing awhile/2 falls were getting out of the shower )  Discussed the use of AI scribe software for clinical note transcription with the patient, who gave verbal consent to proceed.  History of Present Illness   Anna Lyons is a 68 year old female with a history of stroke who presents with recent falls and dizziness. Patient is accompanied by her daughter, who she is okay with being present.  She has experienced three falls in the past few days, two while getting out of the shower and one while moving from her bed to the bathroom. During these incidents, she felt dizzy and weak, describing it as an inability to stand up.  She attempted to hold onto objects for balance but was unable to prevent the falls. She hit her head during one of the falls, resulting in persistent headaches since the incident.  She has a history of stroke, which has left her with a weaker  right leg that she tends to 'scoop' rather than lift fully. This unsteadiness has been more noticeable in the past couple of weeks. She has not been participating in physical therapy post-stroke, although she did some therapy while hospitalized.  No urinary symptoms such as frequency or urgency. She drinks about one bottle of water a day and has a very dry mouth. She experiences restless leg syndrome, which has been worsening, particularly at night, making it difficult for her to sleep. She has not been eating well, sometimes not eating at all when alone, although she eats adequately when with others. Her daughter has been bringing her food.  She has been experiencing headaches since hitting her head during a fall two days ago. These headaches are different from her usual headaches and are not relieved by Advil , which typically helps her manage headaches. She is also experiencing rib pain on the right side.     ROS: Negative unless specifically indicated above in HPI.   Relevant past medical history reviewed and updated as indicated.   Allergies and medications reviewed and updated.   Current Outpatient Medications:    aspirin  EC 81 MG tablet, Take 81 mg by mouth daily., Disp: , Rfl:    atorvastatin  (LIPITOR ) 80 MG tablet, Take 1 tablet (80 mg total) by mouth at bedtime., Disp: 90 tablet, Rfl: 3   buPROPion  (WELLBUTRIN  XL) 300 MG 24 hr tablet, Take 1 tablet (300 mg total) by mouth daily., Disp: 30 tablet, Rfl: 2   cholecalciferol (VITAMIN D3) 25 MCG (1000 UNIT) tablet, Take 1,000 Units by mouth daily., Disp: , Rfl:    escitalopram  (LEXAPRO ) 20 MG tablet, Take 1 tablet (20 mg total) by mouth daily., Disp: 30 tablet, Rfl: 2   ferrous sulfate  324 MG TBEC, Take 324 mg by mouth., Disp: , Rfl:    gabapentin  (NEURONTIN ) 300 MG capsule, Take 1 capsule (300 mg total) by mouth 2 (two) times daily. (Patient taking differently: Take 300 mg by mouth daily.), Disp: 180 capsule, Rfl: 3   levETIRAcetam   (KEPPRA ) 500 MG tablet, Take 1 tablet (500 mg total) by mouth 2 (two) times daily., Disp: 180 tablet, Rfl: 3   Magnesium  250 MG TABS, Take by mouth., Disp: , Rfl:    Multiple Vitamin (MULTIVITAMIN WITH MINERALS) TABS tablet, Take 1 tablet by mouth daily., Disp: , Rfl:    pramipexole  (MIRAPEX ) 0.5 MG tablet, TAKE 1 TABLET BY MOUTH AT BEDTIME AS NEEDED, Disp: 90 tablet, Rfl: 0   temazepam  (RESTORIL ) 15 MG capsule, Take 1 capsule (15 mg total) by mouth at bedtime., Disp: 90 capsule, Rfl: 1   vitamin B-12 (CYANOCOBALAMIN ) 1000 MCG tablet, Take 1,000 mcg by mouth daily., Disp: , Rfl:    DULoxetine  (CYMBALTA ) 60 MG capsule, Take 1 capsule (60 mg total) by mouth daily., Disp: 90 capsule, Rfl: 3  Allergies  Allergen Reactions   Codeine Itching   Oxycodone -Acetaminophen  Hives    Objective:   BP 132/78   Pulse 64   Temp 97.9 F (36.6 C)   Resp 20   Ht 5' 2 (1.575 m)   Wt 125 lb (56.7 kg)   SpO2 96%   BMI 22.86 kg/m    Physical Exam Vitals reviewed.  Constitutional:  General: She is not in acute distress.    Appearance: Normal appearance. She is not ill-appearing, toxic-appearing or diaphoretic.     Comments: Persistent chewing motion, unclear if due to dry mouth or tardive dyskinesia  HENT:     Head: Normocephalic and atraumatic.     Right Ear: Tympanic membrane, ear canal and external ear normal. There is no impacted cerumen (some cerumen present, but not impacted).     Left Ear: Tympanic membrane, ear canal and external ear normal. There is no impacted cerumen.     Mouth/Throat:     Mouth: Mucous membranes are dry.  Eyes:     General: No scleral icterus.       Right eye: No discharge.        Left eye: No discharge.     Extraocular Movements: Extraocular movements intact.     Conjunctiva/sclera: Conjunctivae normal.     Pupils: Pupils are equal, round, and reactive to light.  Cardiovascular:     Rate and Rhythm: Normal rate.  Pulmonary:     Effort: Pulmonary effort is  normal. No respiratory distress.  Abdominal:     Tenderness: There is no right CVA tenderness or left CVA tenderness.  Musculoskeletal:        General: Normal range of motion.     Cervical back: Normal range of motion.     Comments: Pain in ribs on right side.   Skin:    General: Skin is warm and dry.     Capillary Refill: Capillary refill takes less than 2 seconds.     Findings: Abrasion (right side of back) present.  Neurological:     General: No focal deficit present.     Mental Status: She is alert and oriented to person, place, and time. Mental status is at baseline.     Gait: Gait abnormal.  Psychiatric:        Mood and Affect: Mood normal.        Behavior: Behavior normal.        Thought Content: Thought content normal.        Judgment: Judgment normal.

## 2023-10-17 NOTE — Assessment & Plan Note (Signed)
 Unsteady gait and right leg weakness likely related to previous stroke. - Recommend initiation of physical therapy. Orders:   Ambulatory referral to Physical Therapy

## 2023-10-17 NOTE — Assessment & Plan Note (Signed)
 Increased symptoms with significant discomfort during rest and sleep. Orders:   CBC with Differential/Platelet; Future   Comprehensive metabolic panel with GFR; Future   TSH; Future   Vitamin B12; Future   Magnesium ; Future   Ferritin; Future

## 2023-10-17 NOTE — Patient Instructions (Addendum)
 Please drink more water. Goal is 8 8oz glasses per day. Try Biotene mouthwash/spray for your dry mouth.

## 2023-10-18 ENCOUNTER — Emergency Department (HOSPITAL_BASED_OUTPATIENT_CLINIC_OR_DEPARTMENT_OTHER)

## 2023-10-18 ENCOUNTER — Other Ambulatory Visit: Payer: Self-pay

## 2023-10-18 ENCOUNTER — Observation Stay (HOSPITAL_BASED_OUTPATIENT_CLINIC_OR_DEPARTMENT_OTHER)
Admission: EM | Admit: 2023-10-18 | Discharge: 2023-10-19 | Disposition: A | Discharge status: Home / Self Care | Source: Ambulatory Visit | Attending: Internal Medicine | Admitting: Internal Medicine

## 2023-10-18 DIAGNOSIS — G3184 Mild cognitive impairment, so stated: Secondary | ICD-10-CM | POA: Diagnosis not present

## 2023-10-18 DIAGNOSIS — G2581 Restless legs syndrome: Secondary | ICD-10-CM | POA: Diagnosis not present

## 2023-10-18 DIAGNOSIS — G8929 Other chronic pain: Secondary | ICD-10-CM | POA: Diagnosis not present

## 2023-10-18 DIAGNOSIS — Z8673 Personal history of transient ischemic attack (TIA), and cerebral infarction without residual deficits: Secondary | ICD-10-CM | POA: Diagnosis not present

## 2023-10-18 DIAGNOSIS — M47812 Spondylosis without myelopathy or radiculopathy, cervical region: Secondary | ICD-10-CM | POA: Diagnosis not present

## 2023-10-18 DIAGNOSIS — I639 Cerebral infarction, unspecified: Secondary | ICD-10-CM | POA: Diagnosis not present

## 2023-10-18 DIAGNOSIS — R29898 Other symptoms and signs involving the musculoskeletal system: Secondary | ICD-10-CM | POA: Diagnosis present

## 2023-10-18 DIAGNOSIS — M50322 Other cervical disc degeneration at C5-C6 level: Secondary | ICD-10-CM | POA: Insufficient documentation

## 2023-10-18 DIAGNOSIS — R296 Repeated falls: Secondary | ICD-10-CM | POA: Diagnosis not present

## 2023-10-18 DIAGNOSIS — Z87891 Personal history of nicotine dependence: Secondary | ICD-10-CM | POA: Insufficient documentation

## 2023-10-18 DIAGNOSIS — I700 Atherosclerosis of aorta: Secondary | ICD-10-CM | POA: Diagnosis not present

## 2023-10-18 DIAGNOSIS — Z79899 Other long term (current) drug therapy: Secondary | ICD-10-CM | POA: Insufficient documentation

## 2023-10-18 DIAGNOSIS — S32029A Unspecified fracture of second lumbar vertebra, initial encounter for closed fracture: Secondary | ICD-10-CM | POA: Diagnosis not present

## 2023-10-18 DIAGNOSIS — R531 Weakness: Secondary | ICD-10-CM | POA: Insufficient documentation

## 2023-10-18 DIAGNOSIS — F39 Unspecified mood [affective] disorder: Secondary | ICD-10-CM | POA: Insufficient documentation

## 2023-10-18 DIAGNOSIS — R29818 Other symptoms and signs involving the nervous system: Secondary | ICD-10-CM | POA: Diagnosis not present

## 2023-10-18 DIAGNOSIS — Z7982 Long term (current) use of aspirin: Secondary | ICD-10-CM | POA: Diagnosis not present

## 2023-10-18 DIAGNOSIS — S199XXA Unspecified injury of neck, initial encounter: Secondary | ICD-10-CM | POA: Diagnosis not present

## 2023-10-18 DIAGNOSIS — M48062 Spinal stenosis, lumbar region with neurogenic claudication: Secondary | ICD-10-CM | POA: Insufficient documentation

## 2023-10-18 DIAGNOSIS — M545 Low back pain, unspecified: Secondary | ICD-10-CM | POA: Insufficient documentation

## 2023-10-18 DIAGNOSIS — R42 Dizziness and giddiness: Secondary | ICD-10-CM | POA: Insufficient documentation

## 2023-10-18 DIAGNOSIS — W19XXXA Unspecified fall, initial encounter: Secondary | ICD-10-CM | POA: Insufficient documentation

## 2023-10-18 DIAGNOSIS — R519 Headache, unspecified: Secondary | ICD-10-CM | POA: Diagnosis present

## 2023-10-18 DIAGNOSIS — I6782 Cerebral ischemia: Secondary | ICD-10-CM | POA: Diagnosis not present

## 2023-10-18 DIAGNOSIS — M48061 Spinal stenosis, lumbar region without neurogenic claudication: Secondary | ICD-10-CM | POA: Diagnosis not present

## 2023-10-18 DIAGNOSIS — I10 Essential (primary) hypertension: Secondary | ICD-10-CM | POA: Insufficient documentation

## 2023-10-18 DIAGNOSIS — S0990XA Unspecified injury of head, initial encounter: Secondary | ICD-10-CM | POA: Diagnosis not present

## 2023-10-18 DIAGNOSIS — S32010A Wedge compression fracture of first lumbar vertebra, initial encounter for closed fracture: Secondary | ICD-10-CM | POA: Diagnosis not present

## 2023-10-18 DIAGNOSIS — M51369 Other intervertebral disc degeneration, lumbar region without mention of lumbar back pain or lower extremity pain: Secondary | ICD-10-CM | POA: Diagnosis not present

## 2023-10-18 DIAGNOSIS — M4802 Spinal stenosis, cervical region: Secondary | ICD-10-CM | POA: Diagnosis not present

## 2023-10-18 DIAGNOSIS — Z853 Personal history of malignant neoplasm of breast: Secondary | ICD-10-CM | POA: Diagnosis not present

## 2023-10-18 LAB — URINALYSIS, ROUTINE W REFLEX MICROSCOPIC
Bacteria, UA: NONE SEEN
Bilirubin Urine: NEGATIVE
Bilirubin Urine: NEGATIVE
Glucose, UA: NEGATIVE mg/dL
Hgb urine dipstick: NEGATIVE
Ketones, ur: NEGATIVE
Ketones, ur: NEGATIVE mg/dL
Nitrite: NEGATIVE
Nitrite: NEGATIVE
Protein, ur: NEGATIVE mg/dL
Specific Gravity, Urine: 1.02 (ref 1.000–1.030)
Specific Gravity, Urine: 1.019 (ref 1.005–1.030)
Total Protein, Urine: 30 — AB
Urine Glucose: NEGATIVE
Urobilinogen, UA: 0.2 (ref 0.0–1.0)
pH: 6.5 (ref 5.0–8.0)
pH: 7 (ref 5.0–8.0)

## 2023-10-18 LAB — BASIC METABOLIC PANEL WITH GFR
Anion gap: 11 (ref 5–15)
BUN: 7 mg/dL — ABNORMAL LOW (ref 8–23)
CO2: 26 mmol/L (ref 22–32)
Calcium: 9.6 mg/dL (ref 8.9–10.3)
Chloride: 99 mmol/L (ref 98–111)
Creatinine, Ser: 0.75 mg/dL (ref 0.44–1.00)
GFR, Estimated: >60 mL/min (ref >60)
Glucose, Bld: 105 mg/dL — ABNORMAL HIGH (ref 70–99)
Potassium: 3.8 mmol/L (ref 3.5–5.1)
Sodium: 137 mmol/L (ref 135–145)

## 2023-10-18 LAB — MAGNESIUM: Magnesium: 1.6 mg/dL (ref 1.5–2.5)

## 2023-10-18 LAB — CBC WITH DIFFERENTIAL/PLATELET
Abs Immature Granulocytes: 0.01 K/uL (ref 0.00–0.07)
Basophils Absolute: 0.1 K/uL (ref 0.0–0.1)
Basophils Absolute: 0 K/uL (ref 0.0–0.1)
Basophils Relative: 1.3 % (ref 0.0–3.0)
Basophils Relative: 1 %
Eosinophils Absolute: 0.1 K/uL (ref 0.0–0.7)
Eosinophils Absolute: 0.1 K/uL (ref 0.0–0.5)
Eosinophils Relative: 2.1 % (ref 0.0–5.0)
Eosinophils Relative: 2 %
HCT: 34.6 % — ABNORMAL LOW (ref 36.0–46.0)
HCT: 34.5 % — ABNORMAL LOW (ref 36.0–46.0)
Hemoglobin: 11.6 g/dL — ABNORMAL LOW (ref 12.0–15.0)
Hemoglobin: 11.6 g/dL — ABNORMAL LOW (ref 12.0–15.0)
Immature Granulocytes: 0 %
Lymphocytes Relative: 25.5 % (ref 12.0–46.0)
Lymphocytes Relative: 27 %
Lymphs Abs: 1.1 K/uL (ref 0.7–4.0)
Lymphs Abs: 0.9 K/uL (ref 0.7–4.0)
MCH: 31.5 pg (ref 26.0–34.0)
MCHC: 33.6 g/dL (ref 30.0–36.0)
MCHC: 33.6 g/dL (ref 30.0–36.0)
MCV: 92.7 fl (ref 78.0–100.0)
MCV: 93.8 fL (ref 80.0–100.0)
Monocytes Absolute: 0.4 K/uL (ref 0.1–1.0)
Monocytes Absolute: 0.3 K/uL (ref 0.1–1.0)
Monocytes Relative: 8.8 % (ref 3.0–12.0)
Monocytes Relative: 10 %
Neutro Abs: 2.6 K/uL (ref 1.4–7.7)
Neutro Abs: 2 K/uL (ref 1.7–7.7)
Neutrophils Relative %: 62.3 % (ref 43.0–77.0)
Neutrophils Relative %: 60 %
Platelets: 194 K/uL (ref 150.0–400.0)
Platelets: 151 K/uL (ref 150–400)
RBC: 3.73 Mil/uL — ABNORMAL LOW (ref 3.87–5.11)
RBC: 3.68 MIL/uL — ABNORMAL LOW (ref 3.87–5.11)
RDW: 13.2 % (ref 11.5–15.5)
RDW: 12.6 % (ref 11.5–15.5)
WBC: 4.1 K/uL (ref 4.0–10.5)
WBC: 3.3 K/uL — ABNORMAL LOW (ref 4.0–10.5)
nRBC: 0 % (ref 0.0–0.2)

## 2023-10-18 LAB — COMPREHENSIVE METABOLIC PANEL WITH GFR
ALT: 11 U/L (ref 0–35)
AST: 20 U/L (ref 0–37)
Albumin: 4.1 g/dL (ref 3.5–5.2)
Alkaline Phosphatase: 64 U/L (ref 39–117)
BUN: 11 mg/dL (ref 6–23)
CO2: 30 meq/L (ref 19–32)
Calcium: 9.3 mg/dL (ref 8.4–10.5)
Chloride: 99 meq/L (ref 96–112)
Creatinine, Ser: 0.66 mg/dL (ref 0.40–1.20)
GFR: 89.32 mL/min (ref >60.00)
Glucose, Bld: 75 mg/dL (ref 70–99)
Potassium: 4.3 meq/L (ref 3.5–5.1)
Sodium: 138 meq/L (ref 135–145)
Total Bilirubin: 0.7 mg/dL (ref 0.2–1.2)
Total Protein: 7 g/dL (ref 6.0–8.3)

## 2023-10-18 LAB — TSH: TSH: 2.15 u[IU]/mL (ref 0.35–5.50)

## 2023-10-18 LAB — FERRITIN: Ferritin: 40.6 ng/mL (ref 10.0–291.0)

## 2023-10-18 LAB — VITAMIN B12: Vitamin B-12: 1232 pg/mL — ABNORMAL HIGH (ref 211–911)

## 2023-10-18 MED ORDER — ACETAMINOPHEN 325 MG PO TABS
650.0000 mg | ORAL_TABLET | Freq: Once | ORAL | Status: AC
  Administered 2023-10-18: 650 mg via ORAL
  Filled 2023-10-18: qty 2

## 2023-10-18 NOTE — ED Notes (Signed)
 Receiving RN called. Patient has been cleared to transfer to the floor.

## 2023-10-18 NOTE — ED Triage Notes (Signed)
 Patient states fall on Sunday. Hit back of head. States her primary care doctor wanted her to come get a CT scan. Denies blood thinners

## 2023-10-18 NOTE — Care Plan (Signed)
 This 68 yrs old female with history of stroke in the past with residual right-sided lower extremity weakness presented in the ED with recurrent falls, suffered 3 falls within the last 48 hours.  Daughter reports she has been dragging her right leg while walking which is unusual for her.  CT head showed No acute intracranial abnormality.  Remote infarct in the left cerebellum new since 2024.  Remote lacunar infarct in the bilateral basal ganglia.  EDP discussed the case with the neurologist on-call Dr. Germaine who recommended MRI and further workup.  Accepted patient in telemetry bed.

## 2023-10-18 NOTE — ED Provider Notes (Signed)
 [facility] AT [facility] Provider Note   CSN: [id number] Arrival date & time: 10/18/23  1228     Patient presents with: Anna Lyons is a 67 y.o. female with a past medical history significant for hypertension, depression, history of seizure disorder, previous CVA with right sided deficits, dyslipidemia, pulmonary emphysema, history of TIA, and restless leg syndrome who presents to the ED due to persistent posterior headache.  Patient fell and hit her head 2 days ago.  No LOC.  On ASA 81 mg however, no other blood thinners.  Patient then notes she fell twice yesterday.  Admits to low back pain.  Denies saddle anesthesia, bowel/bladder incontinence, lower extremity numbness/tingling.  Has right lower extremity weakness from previous CVA with no change from baseline. History of frequent falls. Now ambulate with walker. PCP just ordered PT for patient. Patient denies chest pain, shortness of breath, fever, cough.  No other injuries.  Denies photophobia.  Denies nausea and vomiting.  Sent to the ED by PCP for CT head due to persistent headache after head injury.  History obtained from patient and past medical records. No interpreter used during encounter.       Prior to Admission medications   Medication Sig Start Date End Date Taking? Authorizing Provider  aspirin  EC 81 MG tablet Take 81 mg by mouth daily.   Yes [provider]  atorvastatin  (LIPITOR ) 80 MG tablet Take 1 tablet (80 mg total) by mouth at bedtime. 05/10/23  Yes Rollene Almarie LABOR, MD  buPROPion  (WELLBUTRIN  XL) 300 MG 24 hr tablet Take 1 tablet (300 mg total) by mouth daily. 09/23/23  Yes Teresa Rogue A, NP  cholecalciferol (VITAMIN D3) 25 MCG (1000 UNIT) tablet Take 1,000 Units by mouth daily.   Yes [provider]  escitalopram  (LEXAPRO ) 20 MG tablet Take 1 tablet (20 mg total) by mouth daily. 09/23/23  Yes White, Rogue A, NP  ferrous sulfate  324 MG TBEC Take 324 mg by  mouth.   Yes [provider]  gabapentin  (NEURONTIN ) 300 MG capsule Take 1 capsule (300 mg total) by mouth 2 (two) times daily. Patient taking differently: Take 300 mg by mouth daily. 05/10/23  Yes Rollene Almarie LABOR, MD  levETIRAcetam  (KEPPRA ) 500 MG tablet Take 1 tablet (500 mg total) by mouth 2 (two) times daily. 05/10/23  Yes Rollene Almarie LABOR, MD  Magnesium  250 MG TABS Take by mouth.   Yes [provider]  Multiple Vitamin (MULTIVITAMIN WITH MINERALS) TABS tablet Take 1 tablet by mouth daily.   Yes [provider]  pramipexole  (MIRAPEX ) 0.5 MG tablet TAKE 1 TABLET BY MOUTH AT BEDTIME AS NEEDED 10/10/23  Yes Rollene Almarie LABOR, MD  temazepam  (RESTORIL ) 15 MG capsule Take 1 capsule (15 mg total) by mouth at bedtime. 05/10/23  Yes Rollene Almarie LABOR, MD  vitamin B-12 (CYANOCOBALAMIN ) 1000 MCG tablet Take 1,000 mcg by mouth daily.   Yes [provider]  DULoxetine  (CYMBALTA ) 60 MG capsule Take 1 capsule (60 mg total) by mouth daily. Patient not taking: Reported on 10/18/2023 05/10/23   Rollene Almarie LABOR, MD    Allergies: Codeine and Oxycodone -acetaminophen     Review of Systems  Eyes:  Negative for visual disturbance.  Respiratory:  Negative for shortness of breath.   Cardiovascular:  Negative for chest pain.  Gastrointestinal:  Negative for abdominal pain.  Musculoskeletal:  Positive for back pain and gait problem.  Neurological:  Positive for weakness and headaches. Negative for speech  difficulty.    Updated Vital Signs BP (!) 145/80   Pulse (!) 53   Temp 97.7 F (36.5 C) (Oral)   Resp 17   SpO2 96%   Physical Exam Vitals and nursing note reviewed.  Constitutional:      General: She is not in acute distress.    Appearance: She is not ill-appearing.  HENT:     Head: Normocephalic.  Eyes:     Pupils: Pupils are equal, round, and reactive to light.  Cardiovascular:     Rate and Rhythm: Normal rate and regular rhythm.     Pulses:  Normal pulses.     Heart sounds: Normal heart sounds. No murmur heard.    No friction rub. No gallop.  Pulmonary:     Effort: Pulmonary effort is normal.     Breath sounds: Normal breath sounds.  Abdominal:     General: Abdomen is flat. There is no distension.     Palpations: Abdomen is soft.     Tenderness: There is no abdominal tenderness. There is no guarding or rebound.  Musculoskeletal:        General: Normal range of motion.     Cervical back: Neck supple.     Comments: No thoracic midline tenderness. Mild lumber midline tenderness  Skin:    General: Skin is warm and dry.  Neurological:     General: No focal deficit present.     Mental Status: She is alert.     Comments: Normal speech No facial droop 4/5 strength of RLE; 5/5 strength of other extremities  Psychiatric:        Mood and Affect: Mood normal.        Behavior: Behavior normal.     (all labs ordered are listed, but only abnormal results are displayed) Labs Reviewed  CBC WITH DIFFERENTIAL/PLATELET - Abnormal; Notable for the following components:      Result Value   WBC 3.3 (*)    RBC 3.68 (*)    Hemoglobin 11.6 (*)    HCT 34.5 (*)    All other components within normal limits  BASIC METABOLIC PANEL WITH GFR - Abnormal; Notable for the following components:   Glucose, Bld 105 (*)    BUN 7 (*)    All other components within normal limits  URINALYSIS, ROUTINE W REFLEX MICROSCOPIC - Abnormal; Notable for the following components:   Leukocytes,Ua TRACE (*)    All other components within normal limits    EKG: None  Radiology: CT Head Wo Contrast Result Date: 10/18/2023 EXAM: CT HEAD WITHOUT CONTRAST 10/18/2023 01:14:20 PM TECHNIQUE: CT of the head was performed without the administration of intravenous contrast. Automated exposure control, iterative reconstruction, and/or weight based adjustment of the mA/kV was utilized to reduce the radiation dose to as low as reasonably achievable. COMPARISON: 10/24/2022  CLINICAL HISTORY: Head trauma, minor (Age >= 65y). Patient states fall on Sunday. Hit back of head. States her primary care doctor wanted her to come get a CT scan. Denies blood thinners. FINDINGS: BRAIN AND VENTRICLES: No acute hemorrhage. No evidence of acute infarct. No hydrocephalus. No extra-axial collection. No mass effect or midline shift. Nonspecific hypoattenuation in the periventricular and subcortical white matter, most likely representing chronic microvascular ischemic changes. Remote lacunar infarcts in the bilateral basal ganglia. Remote infarct in the left cerebellum. ORBITS: No acute abnormality. SINUSES: No acute abnormality. SOFT TISSUES AND SKULL: No acute soft tissue abnormality. No skull fracture. IMPRESSION: 1. No acute intracranial abnormality. 2. Remote infarct  in the left cerebellum, new since 2024. 3. Remote lacunar infarcts in the bilateral basal ganglia. 4. Mild chronic microvascular ischemic changes. Electronically signed by: Donnice Mania MD 10/18/2023 01:31 PM EDT RP Workstation: [id number]   CT Lumbar Spine Wo Contrast Result Date: 10/18/2023 CLINICAL DATA:  Back trauma, no prior imaging (Age >= 16y). Recent fall. EXAM: CT LUMBAR SPINE WITHOUT CONTRAST TECHNIQUE: Multidetector CT imaging of the lumbar spine was performed without intravenous contrast administration. Multiplanar CT image reconstructions were also generated. RADIATION DOSE REDUCTION: This exam was performed according to the departmental dose-optimization program which includes automated exposure control, adjustment of the mA and/or kV according to patient size and/or use of iterative reconstruction technique. COMPARISON:  Lumbar spine MRI 05/16/2019.  CT chest 11/21/2019. FINDINGS: Segmentation: There is transitional lumbosacral anatomy. When correlating with the prior chest CT, there are no ribs at T12 and the transitional segment is a partially sacralized L5. Alignment: Chronic grade 1 retrolisthesis of L1 on L2.  Vertebrae: Nondisplaced, acute appearing right L2 transverse process fracture. Unchanged chronic compression fracture and prominent Schmorl's node deformity involving the L1 inferior endplate. No new compression fracture. No suspicious bone lesion. Paraspinal and other soft tissues: Abdominal aortic atherosclerosis. Disc levels: At L2-3, disc bulging, a suspected new central disc protrusion, prominent dorsal epidural fat, and posterior element hypertrophy result in increased, moderate spinal stenosis and mild bilateral neural foraminal stenosis. Disc bulging and posterior element hypertrophy also result in mild spinal stenosis at L3-4 and moderate to severe spinal stenosis at L4-5 with the latter having progressed. There is moderate bilateral neural foraminal stenosis at L4-5. IMPRESSION: 1. Acute right L2 transverse process fracture. 2. Progressive lumbar disc and facet degeneration with moderate to severe spinal stenosis at L4-5 and moderate spinal stenosis at L2-3. 3.  Aortic Atherosclerosis (ICD10-I70.0). Electronically Signed   By: Dasie Hamburg M.D.   On: 10/18/2023 13:30   CT Cervical Spine Wo Contrast Result Date: 10/18/2023 EXAM: CT CERVICAL SPINE WITHOUT CONTRAST 10/18/2023 01:14:20 PM TECHNIQUE: CT of the cervical spine was performed without the administration of intravenous contrast. Multiplanar reformatted images are provided for review. Automated exposure control, iterative reconstruction, and/or weight based adjustment of the mA/kV was utilized to reduce the radiation dose to as low as reasonably achievable. COMPARISON: CT cervical spine 10/24/2022. CLINICAL HISTORY: Neck trauma (Age >= 65y). Patient states fall on Sunday. Hit back of head. States her primary care doctor wanted her to come get a CT scan. Denies blood thinners. FINDINGS: CERVICAL SPINE: BONES AND ALIGNMENT: No acute fracture or traumatic malalignment. DEGENERATIVE CHANGES: Degenerative changes and osteophytes at multiple levels.  Degenerative changes of the dens. Disc space narrowing most pronounced at C5-6. Small disc bulges at multiple levels. Facet arthrosis and uncovertebral hypertrophy at multiple levels with foraminal stenosis noted, most pronounced at C5-6. There is no high-grade osseous spinal canal stenosis. SOFT TISSUES: No prevertebral soft tissue swelling. IMPRESSION: 1. No acute abnormality of the cervical spine. 2. Degenerative changes most pronounced at C5-6. Electronically signed by: Donnice Mania MD 10/18/2023 01:26 PM EDT RP Workstation: [id number]     Procedures   Medications Ordered in the ED  acetaminophen  (TYLENOL ) tablet 650 mg (650 mg Oral Given 10/18/23 1338)    Clinical Course as of 10/18/23 1633  Tue Oct 18, 2023  1306 Attempted to call patient's daughter, Stephane with number in the computer however, unable to get in touch with Dawn.  Will try again. [CA]    Clinical Course User Index [CA] Lorelle Aleck BROCKS,  PA-C                                 Medical Decision Making Amount and/or Complexity of Data Reviewed External Data Reviewed: notes.    Details: PCP note Labs: ordered. Decision-making details documented in ED Course. Radiology: ordered and independent interpretation performed. Decision-making details documented in ED Course.  Risk OTC drugs. Decision regarding hospitalization.   This patient presents to the ED for concern of headache/fall, this involves an extensive number of treatment options, and is a complaint that carries with it a high risk of complications and morbidity.  The differential diagnosis includes intracranial bleed, bony fracture, dehydration, electrolyte abnormalities, CVA, etc  68 year old female presents to the ED due to persistent headache after hitting her head 2 days ago.  History of frequent falls.  Is being worked up by PCP.  Previous CVA with right-sided residual weakness especially the right lower extremity.  Fell twice yesterday and admits to low back  pain.  On ASA 81 mg however, no other blood thinners.  Upon arrival, stable vitals.  Patient in no acute distress.  Does have some lumbar midline tenderness.  CT lumbar ordered to rule out bony fracture.  Has slight right lower extremity weakness from previous CVA however, otherwise unremarkable neurological exam.  CT head and cervical spine ordered.  Routine labs and UA ordered.  Low suspicion for cauda equina or central cord compression.  Tylenol  given for headache.  Discussed with Dawn, patient's daughter on the phone. She notes patient has been dragging her right foot for 2-3 weeks. Notes she has been significantly off balance from her baseline.  CBC significant for leukopenia at 3.3.  Hemoglobin 11.6.  Normal platelets.  BMP with normal renal function.  No major electrolyte derangements.  UA With trace leukocytes.  Low suspicion for acute cystitis.  CT head personally reviewed and interpreted which is negative for any acute abnormalities.  Does demonstrate remote infarct in the left cerebellum and bilateral basal ganglia.  CT cervical spine negative.  CT lumbar spine demonstrates an acute right L2 TP fracture.  Patient able to move lower extremities.  Low suspicion for cauda equina or central cord compression.   3:53 PM Discussed with Dr. Germaine with neurology who recommends admission for further work-up. Unforuntately, no MRI at facility. Order placed to obtain MRI once transferred.  4:28 PM Discussed with Dr. Leotis with [facility] who agrees to admit patient  Co morbidities that complicate the patient evaluation  HTN, seizure disorder  Social Determinants of Health:  Elderly >65  Test / Admission - Considered:  Admit for stroke work-up  Discussed with Dr. Gennaro who evaluated patient at bedside and agrees with assessment and plan.    Final diagnoses:  Frequent falls  Closed fracture of second lumbar vertebra, unspecified fracture morphology, initial encounter [facility])    ED Discharge  Orders     None          Lorelle Aleck BROCKS, PA-C 10/18/23 1635    Kammerer, Megan L, DO 10/19/23 1603

## 2023-10-18 NOTE — ED Notes (Signed)
 Patient transported to CT

## 2023-10-18 NOTE — Telephone Encounter (Signed)
 Is this ok to do?

## 2023-10-19 ENCOUNTER — Ambulatory Visit: Payer: Self-pay | Admitting: Family Medicine

## 2023-10-19 ENCOUNTER — Inpatient Hospital Stay (HOSPITAL_COMMUNITY)

## 2023-10-19 ENCOUNTER — Encounter (HOSPITAL_COMMUNITY): Payer: Self-pay | Admitting: Internal Medicine

## 2023-10-19 DIAGNOSIS — M50322 Other cervical disc degeneration at C5-C6 level: Secondary | ICD-10-CM | POA: Diagnosis not present

## 2023-10-19 DIAGNOSIS — R531 Weakness: Secondary | ICD-10-CM | POA: Diagnosis not present

## 2023-10-19 DIAGNOSIS — R296 Repeated falls: Secondary | ICD-10-CM | POA: Diagnosis not present

## 2023-10-19 DIAGNOSIS — R29898 Other symptoms and signs involving the musculoskeletal system: Secondary | ICD-10-CM | POA: Diagnosis not present

## 2023-10-19 DIAGNOSIS — Z8673 Personal history of transient ischemic attack (TIA), and cerebral infarction without residual deficits: Secondary | ICD-10-CM | POA: Diagnosis not present

## 2023-10-19 DIAGNOSIS — M549 Dorsalgia, unspecified: Secondary | ICD-10-CM | POA: Diagnosis not present

## 2023-10-19 DIAGNOSIS — G8929 Other chronic pain: Secondary | ICD-10-CM | POA: Diagnosis not present

## 2023-10-19 DIAGNOSIS — S32029A Unspecified fracture of second lumbar vertebra, initial encounter for closed fracture: Secondary | ICD-10-CM | POA: Diagnosis not present

## 2023-10-19 DIAGNOSIS — R42 Dizziness and giddiness: Secondary | ICD-10-CM | POA: Diagnosis not present

## 2023-10-19 DIAGNOSIS — G3184 Mild cognitive impairment, so stated: Secondary | ICD-10-CM | POA: Diagnosis not present

## 2023-10-19 DIAGNOSIS — Z743 Need for continuous supervision: Secondary | ICD-10-CM | POA: Diagnosis not present

## 2023-10-19 DIAGNOSIS — Z853 Personal history of malignant neoplasm of breast: Secondary | ICD-10-CM | POA: Diagnosis not present

## 2023-10-19 DIAGNOSIS — F39 Unspecified mood [affective] disorder: Secondary | ICD-10-CM | POA: Diagnosis not present

## 2023-10-19 DIAGNOSIS — G2581 Restless legs syndrome: Secondary | ICD-10-CM | POA: Diagnosis not present

## 2023-10-19 DIAGNOSIS — I10 Essential (primary) hypertension: Secondary | ICD-10-CM | POA: Diagnosis not present

## 2023-10-19 LAB — HIV ANTIBODY (ROUTINE TESTING W REFLEX): HIV Screen 4th Generation wRfx: NONREACTIVE

## 2023-10-19 LAB — CBC
HCT: 33.6 % — ABNORMAL LOW (ref 36.0–46.0)
Hemoglobin: 11.2 g/dL — ABNORMAL LOW (ref 12.0–15.0)
MCH: 30.9 pg (ref 26.0–34.0)
MCHC: 33.3 g/dL (ref 30.0–36.0)
MCV: 92.8 fL (ref 80.0–100.0)
Platelets: 147 K/uL — ABNORMAL LOW (ref 150–400)
RBC: 3.62 MIL/uL — ABNORMAL LOW (ref 3.87–5.11)
RDW: 12.4 % (ref 11.5–15.5)
WBC: 3.5 K/uL — ABNORMAL LOW (ref 4.0–10.5)
nRBC: 0 % (ref 0.0–0.2)

## 2023-10-19 LAB — BASIC METABOLIC PANEL WITH GFR
Anion gap: 11 (ref 5–15)
BUN: 9 mg/dL (ref 8–23)
CO2: 26 mmol/L (ref 22–32)
Calcium: 8.8 mg/dL — ABNORMAL LOW (ref 8.9–10.3)
Chloride: 101 mmol/L (ref 98–111)
Creatinine, Ser: 0.74 mg/dL (ref 0.44–1.00)
GFR, Estimated: >60 mL/min (ref >60)
Glucose, Bld: 92 mg/dL (ref 70–99)
Potassium: 4 mmol/L (ref 3.5–5.1)
Sodium: 138 mmol/L (ref 135–145)

## 2023-10-19 LAB — MAGNESIUM: Magnesium: 1.7 mg/dL (ref 1.7–2.4)

## 2023-10-19 LAB — PHOSPHORUS: Phosphorus: 3.3 mg/dL (ref 2.5–4.6)

## 2023-10-19 LAB — TSH: TSH: 3.82 u[IU]/mL (ref 0.350–4.500)

## 2023-10-19 MED ORDER — FERROUS SULFATE 325 (65 FE) MG PO TABS
325.0000 mg | ORAL_TABLET | Freq: Every day | ORAL | Status: DC
Start: 2023-10-19 — End: 2023-10-19

## 2023-10-19 MED ORDER — SODIUM CHLORIDE 0.9 % IV BOLUS
1000.0000 mL | Freq: Once | INTRAVENOUS | Status: AC
  Administered 2023-10-19: 1000 mL via INTRAVENOUS

## 2023-10-19 MED ORDER — LACTATED RINGERS IV BOLUS: 1000.0000 mL | Freq: Once | INTRAVENOUS | Status: DC

## 2023-10-19 MED ORDER — VITAMIN B-12 1000 MCG PO TABS
1000.0000 ug | ORAL_TABLET | Freq: Every day | ORAL | Status: DC
  Administered 2023-10-19: 1000 ug via ORAL
  Filled 2023-10-19: qty 1

## 2023-10-19 MED ORDER — ATORVASTATIN CALCIUM 80 MG PO TABS: 80.0000 mg | ORAL_TABLET | Freq: Every day | ORAL | Status: DC

## 2023-10-19 MED ORDER — LEVETIRACETAM 500 MG PO TABS
500.0000 mg | ORAL_TABLET | Freq: Two times a day (BID) | ORAL | Status: DC
  Administered 2023-10-19: 500 mg via ORAL
  Filled 2023-10-19: qty 1

## 2023-10-19 MED ORDER — POLYETHYLENE GLYCOL 3350 17 G PO PACK: 17.0000 g | PACK | Freq: Every day | ORAL | Status: DC | PRN

## 2023-10-19 MED ORDER — GLUCAGON HCL RDNA (DIAGNOSTIC) 1 MG IJ SOLR: 1.0000 mg | INTRAMUSCULAR | Status: DC | PRN

## 2023-10-19 MED ORDER — MELATONIN 3 MG PO TABS: 6.0000 mg | ORAL_TABLET | Freq: Every evening | ORAL | Status: DC | PRN

## 2023-10-19 MED ORDER — ALBUTEROL SULFATE (2.5 MG/3ML) 0.083% IN NEBU: 2.5000 mg | INHALATION_SOLUTION | RESPIRATORY_TRACT | Status: DC | PRN

## 2023-10-19 MED ORDER — METOPROLOL TARTRATE 5 MG/5ML IV SOLN: 5.0000 mg | INTRAVENOUS | Status: DC | PRN

## 2023-10-19 MED ORDER — LACTATED RINGERS IV BOLUS: 500.0000 mL | Freq: Once | INTRAVENOUS | Status: DC

## 2023-10-19 MED ORDER — ENOXAPARIN SODIUM 40 MG/0.4ML IJ SOSY
40.0000 mg | PREFILLED_SYRINGE | INTRAMUSCULAR | Status: DC
  Administered 2023-10-19: 40 mg via SUBCUTANEOUS
  Filled 2023-10-19: qty 0.4

## 2023-10-19 MED ORDER — TEMAZEPAM 7.5 MG PO CAPS: 15.0000 mg | ORAL_CAPSULE | Freq: Every day | ORAL | Status: DC

## 2023-10-19 MED ORDER — SODIUM CHLORIDE 0.9% FLUSH
3.0000 mL | Freq: Two times a day (BID) | INTRAVENOUS | Status: DC
  Administered 2023-10-19: 3 mL via INTRAVENOUS

## 2023-10-19 MED ORDER — FENTANYL CITRATE PF 50 MCG/ML IJ SOSY
50.0000 ug | PREFILLED_SYRINGE | Freq: Once | INTRAMUSCULAR | Status: AC
  Administered 2023-10-19: 50 ug via INTRAVENOUS
  Filled 2023-10-19: qty 1

## 2023-10-19 MED ORDER — HYDRALAZINE HCL 20 MG/ML IJ SOLN
10.0000 mg | INTRAMUSCULAR | Status: DC | PRN
Start: 2023-10-19 — End: 2023-10-19

## 2023-10-19 MED ORDER — ENSURE PLUS HIGH PROTEIN PO LIQD
237.0000 mL | Freq: Two times a day (BID) | ORAL | Status: DC
  Administered 2023-10-19: 237 mL via ORAL

## 2023-10-19 MED ORDER — ADULT MULTIVITAMIN W/MINERALS CH
1.0000 | ORAL_TABLET | Freq: Every day | ORAL | Status: DC
  Administered 2023-10-19: 1 via ORAL
  Filled 2023-10-19: qty 1

## 2023-10-19 MED ORDER — MECLIZINE HCL 25 MG PO TABS
25.0000 mg | ORAL_TABLET | Freq: Three times a day (TID) | ORAL | Status: DC | PRN
  Filled 2023-10-19: qty 1

## 2023-10-19 MED ORDER — BUPROPION HCL ER (XL) 150 MG PO TB24
300.0000 mg | ORAL_TABLET | Freq: Every day | ORAL | Status: DC
  Administered 2023-10-19: 300 mg via ORAL
  Filled 2023-10-19: qty 2

## 2023-10-19 MED ORDER — GABAPENTIN 300 MG PO CAPS
300.0000 mg | ORAL_CAPSULE | Freq: Every day | ORAL | Status: DC
  Administered 2023-10-19: 300 mg via ORAL
  Filled 2023-10-19: qty 1

## 2023-10-19 MED ORDER — ONDANSETRON HCL 4 MG/2ML IJ SOLN: 4.0000 mg | Freq: Four times a day (QID) | INTRAMUSCULAR | Status: DC | PRN

## 2023-10-19 MED ORDER — ESCITALOPRAM OXALATE 20 MG PO TABS
20.0000 mg | ORAL_TABLET | Freq: Every day | ORAL | Status: DC
  Administered 2023-10-19: 20 mg via ORAL
  Filled 2023-10-19: qty 1

## 2023-10-19 MED ORDER — FERROUS SULFATE 325 (65 FE) MG PO TABS
324.0000 mg | ORAL_TABLET | Freq: Every day | ORAL | Status: DC
Start: 2023-10-19 — End: 2023-10-19
  Administered 2023-10-19: 324 mg via ORAL
  Filled 2023-10-19: qty 1

## 2023-10-19 MED ORDER — PRAMIPEXOLE DIHYDROCHLORIDE 0.25 MG PO TABS: 0.5000 mg | ORAL_TABLET | Freq: Every evening | ORAL | Status: DC | PRN

## 2023-10-19 MED ORDER — IPRATROPIUM-ALBUTEROL 0.5-2.5 (3) MG/3ML IN SOLN: 3.0000 mL | RESPIRATORY_TRACT | Status: DC | PRN

## 2023-10-19 MED ORDER — ASPIRIN 81 MG PO TBEC
81.0000 mg | DELAYED_RELEASE_TABLET | Freq: Every day | ORAL | Status: DC
  Administered 2023-10-19: 81 mg via ORAL
  Filled 2023-10-19: qty 1

## 2023-10-19 NOTE — Progress Notes (Signed)
 MRI completed, pt back on unit.

## 2023-10-19 NOTE — Hospital Course (Addendum)
 Brief Narrative:   68 y.o. female with hx of prior stroke ' 13 (L lacunar; treated with tPA), Lumbar spondylosis with associated neuroforaminal stenosis, + spinal stenosis.; was to be referred to neurosurgery in '21 although unclear if seen; appears previously she had intact strength in the R leg when seen by neurology in '21; additional hx including seizure disorder, mild cognitive impairment, hx breast cancer, HTN, RLS, mood d/o, who presented due to more frequent falls and transferred from [facility] ED for stroke evaluation. ultimately stroke eval negative concern for possible progressive L spine stenosis / neuroforaminal disease.  MRI brain negative for any acute pathology.  MRI lumbar spine shows chronic changes but nothing new since 2021.  Patient is medically stable for discharge.  Assessment & Plan:  Recurrent ground level falls,  RLE weakness, worsening subacutely Hx of R hemiparesis after stroke in '13  Hx lumbar spondylosis, neuroforaminal + spinal stenosis  Concerns of right lower extremity weakness and CVA.  CT head and MRI brain are negative for acute pathology but does have some cerebellar CVA. Mri L spine showed chronic changes. 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  This could be related to that versus vertigo.  Meclizine  3 times daily as needed started. -Slightly dry mouth therefore we will give 500cc fluid bolus.     Acute L2 transverse process fracture  -- Currently has minimal symptoms related to this. Tylenol  prn    C spine degenerative change C5-6     Hx stroke '13 Continue home aspirin , atorvastatin .   Seizure disorder Continue home Keppra   Mild cognitive impairment  Noted, she is fully oriented on exam.   Hx breast CA  hx bilateral mastectomy; outpatient survivorship   HTN Not currently on antiHTN   RLS  Continue home Pramipexole    Mood d/o  Continue home Bupropion , Duloxetine ,  Temazepam  at bedtime   ? Chronic pain Continue home Gabapentin    PT/OT evaluation   DVT prophylaxis: enoxaparin  (LOVENOX ) injection 40 mg Start: 10/19/23 1000      Code Status: Limited: Do not attempt resuscitation (DNR) -DNR-LIMITED -Do Not Intubate/DNI  Family Communication:   Status is: Inpatient Remains inpatient appropriate because: RLE weakness management.  Discharge today  PT Follow up Recs:   Subjective:  Some dizziness no other complaints.   Examination:  General exam: Appears calm and comfortable  Respiratory system: Clear to auscultation. Respiratory effort normal. Cardiovascular system: S1 & S2 heard, RRR. No JVD, murmurs, rubs, gallops or clicks. No pedal edema. Gastrointestinal system: Abdomen is nondistended, soft and nontender. No organomegaly or masses felt. Normal bowel sounds heard. Central nervous system: Alert and oriented. No focal neurological deficits. Extremities: Symmetric 5 x 5 power. Skin: No rashes, lesions or ulcers Psychiatry: Judgement and insight appear normal. Mood & affect appropriate.

## 2023-10-19 NOTE — Discharge Summary (Signed)
 Physician Discharge Summary  Anna Lyons FMW:[id number] DOB: 17-Aug-1955 DOA: 10/18/2023  PCP: Rollene Almarie LABOR, MD  Admit date: 10/18/2023 Discharge date: 10/19/2023  Admitted From: Home Disposition:  Home  Recommendations for Outpatient Follow-up:  Follow up with PCP in 1-2 weeks Please obtain BMP/CBC in one week your next doctors visit.  Outpatient follow up with Psych to discuss her medication.    Discharge Condition: Stable CODE STATUS: DNR Diet recommendation: Regular.   Brief/Interim Summary: Brief Narrative:   68 y.o. female with hx of prior stroke ' 13 (L lacunar; treated with tPA), Lumbar spondylosis with associated neuroforaminal stenosis, + spinal stenosis.; was to be referred to neurosurgery in '21 although unclear if seen; appears previously she had intact strength in the R leg when seen by neurology in '21; additional hx including seizure disorder, mild cognitive impairment, hx breast cancer, HTN, RLS, mood d/o, who presented due to more frequent falls and transferred from [facility] ED for stroke evaluation. ultimately stroke eval negative concern for possible progressive L spine stenosis / neuroforaminal disease.  MRI brain negative for any acute pathology.  MRI lumbar spine shows chronic changes but nothing new since 2021.  Patient is medically stable for discharge.  Assessment & Plan:  Recurrent ground level falls,  RLE weakness, worsening subacutely Hx of R hemiparesis after stroke in '13  Hx lumbar spondylosis, neuroforaminal + spinal stenosis  Concerns of right lower extremity weakness and CVA.  CT head and MRI brain are negative for acute pathology but does have some cerebellar CVA. Mri L spine showed chronic changes. 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  This could be related to that versus vertigo.  Meclizine  3 times daily as needed started. -Slightly dry mouth therefore we will give  500cc fluid bolus.     Acute L2 transverse process fracture  -- Currently has minimal symptoms related to this. Tylenol  prn     C spine degenerative change C5-6     Hx stroke '13 Continue home aspirin , atorvastatin .   Seizure disorder Continue home Keppra   Mild cognitive impairment  Noted, she is fully oriented on exam.   Hx breast CA  hx bilateral mastectomy; outpatient survivorship   HTN Not currently on antiHTN   RLS  Continue home Pramipexole    Mood d/o  Continue home Bupropion , Duloxetine , Temazepam  at bedtime   ? Chronic pain Continue home Gabapentin    PT/OT evaluation   DVT prophylaxis: enoxaparin  (LOVENOX ) injection 40 mg Start: 10/19/23 1000      Code Status: Limited: Do not attempt resuscitation (DNR) -DNR-LIMITED -Do Not Intubate/DNI  Family Communication:   Status is: Inpatient Remains inpatient appropriate because: RLE weakness management.  Discharge today  PT Follow up Recs:   Subjective:  Some dizziness no other complaints.   Examination:  General exam: Appears calm and comfortable  Respiratory system: Clear to auscultation. Respiratory effort normal. Cardiovascular system: S1 & S2 heard, RRR. No JVD, murmurs, rubs, gallops or clicks. No pedal edema. Gastrointestinal system: Abdomen is nondistended, soft and nontender. No organomegaly or masses felt. Normal bowel sounds heard. Central nervous system: Alert and oriented. No focal neurological deficits. Extremities: Symmetric 5 x 5 power. Skin: No rashes, lesions or ulcers Psychiatry: Judgement and insight appear normal. Mood & affect appropriate.    Discharge Diagnoses:  Principal Problem:   Recurrent falls Active Problems:   Weakness of right lower extremity      Discharge Exam:  Vitals:   10/19/23 0644 10/19/23 0728  BP: (!) 149/82 (!) 149/72  Pulse: 61 (!) 59  Resp: 17 16  Temp: 97.9 F (36.6 C) 98.4 F (36.9 C)  SpO2: 93% 93%   Vitals:   10/19/23 0100 10/19/23 0644  10/19/23 0728 10/19/23 1100  BP: (!) 146/89 (!) 149/82 (!) 149/72   Pulse: (!) 57 61 (!) 59   Resp: 18 17 16    Temp: 98.1 F (36.7 C) 97.9 F (36.6 C) 98.4 F (36.9 C)   TempSrc: Oral Oral Oral   SpO2: 94% 93% 93%   Weight:    56.7 kg  Height:    5' 2 (1.575 m)      Discharge Instructions   Allergies as of 10/19/2023       Reactions   Codeine Itching   Oxycodone -acetaminophen  Hives        Medication List     TAKE these medications    aspirin  EC 81 MG tablet Take 81 mg by mouth daily.   atorvastatin  80 MG tablet Commonly known as: LIPITOR  Take 1 tablet (80 mg total) by mouth at bedtime.   buPROPion  300 MG 24 hr tablet Commonly known as: Wellbutrin  XL Take 1 tablet (300 mg total) by mouth daily.   cholecalciferol 25 MCG (1000 UNIT) tablet Commonly known as: VITAMIN D3 Take 1,000 Units by mouth daily.   cyanocobalamin  1000 MCG tablet Commonly known as: VITAMIN B12 Take 1,000 mcg by mouth daily.   escitalopram  20 MG tablet Commonly known as: Lexapro  Take 1 tablet (20 mg total) by mouth daily.   ferrous sulfate  324 MG Tbec Take 324 mg by mouth.   gabapentin  300 MG capsule Commonly known as: NEURONTIN  Take 1 capsule (300 mg total) by mouth 2 (two) times daily. What changed: when to take this   levETIRAcetam  500 MG tablet Commonly known as: KEPPRA  Take 1 tablet (500 mg total) by mouth 2 (two) times daily.   Magnesium  250 MG Tabs Take by mouth.   multivitamin with minerals Tabs tablet Take 1 tablet by mouth daily.   pramipexole  0.5 MG tablet Commonly known as: MIRAPEX  TAKE 1 TABLET BY MOUTH AT BEDTIME AS NEEDED   temazepam  15 MG capsule Commonly known as: RESTORIL  Take 1 capsule (15 mg total) by mouth at bedtime.        Allergies  Allergen Reactions   Codeine Itching   Oxycodone -Acetaminophen  Hives    You were cared for by a hospitalist during your hospital stay. If you have any questions about your discharge medications or the care  you received while you were in the hospital after you are discharged, you can call the unit and asked to speak with the hospitalist on call if the hospitalist that took care of you is not available. Once you are discharged, your primary care physician will handle any further medical issues. Please note that no refills for any discharge medications will be authorized once you are discharged, as it is imperative that you return to your primary care physician (or establish a relationship with a primary care physician if you do not have one) for your aftercare needs so that they can reassess your need for medications and monitor your lab values.  You were cared for by a hospitalist during your hospital stay. If you have any questions about your discharge medications or the care you received while you were in the hospital after you are discharged, you can call the unit and asked to speak with the hospitalist  on call if the hospitalist that took care of you is not available. Once you are discharged, your primary care physician will handle any further medical issues. Please note that NO REFILLS for any discharge medications will be authorized once you are discharged, as it is imperative that you return to your primary care physician (or establish a relationship with a primary care physician if you do not have one) for your aftercare needs so that they can reassess your need for medications and monitor your lab values.  Please request your Prim.MD to go over all Hospital Tests and Procedure/Radiological results at the follow up, please get all Hospital records sent to your Prim MD by signing hospital release before you go home.  Get CBC, CMP, 2 view Chest X ray checked  by Primary MD during your next visit or SNF MD in 5-7 days ( we routinely change or add medications that can affect your baseline labs and fluid status, therefore we recommend that you get the mentioned basic workup next visit with your PCP, your PCP  may decide not to get them or add new tests based on their clinical decision)  On your next visit with your primary care physician please Get Medicines reviewed and adjusted.  If you experience worsening of your admission symptoms, develop shortness of breath, life threatening emergency, suicidal or homicidal thoughts you must seek medical attention immediately by calling 911 or calling your MD immediately  if symptoms less severe.  You Must read complete instructions/literature along with all the possible adverse reactions/side effects for all the Medicines you take and that have been prescribed to you. Take any new Medicines after you have completely understood and accpet all the possible adverse reactions/side effects.   Do not drive, operate heavy machinery, perform activities at heights, swimming or participation in water activities or provide baby sitting services if your were admitted for syncope or siezures until you have seen by Primary MD or a Neurologist and advised to do so again.  Do not drive when taking Pain medications.   Procedures/Studies: MR LUMBAR SPINE WO CONTRAST Result Date: 10/19/2023 CLINICAL DATA:  Right leg weakness EXAM: MRI LUMBAR SPINE WITHOUT CONTRAST TECHNIQUE: Multiplanar, multisequence MR imaging of the lumbar spine was performed. No intravenous contrast was administered. COMPARISON:  May 16, 2019 FINDINGS: Bone marrow: No significant abnormality. Incidental Schmorl's node in the inferior endplate of L1. Conus and cauda equina: No significant abnormality Paraspinal tissues: No significant abnormality L1-L2: There is moderate degenerative disc disease with a mild disc bulge. No significant facet disease. No spinal stenosis or foraminal stenosis L2-L3: There is mild degenerative disc disease with a broad-based disc bulge. There is mild facet arthropathy with facet enlargement. There is mild spinal stenosis L3-L4: There is mild degenerative disc disease with a mild  disc bulge. No significant facet disease. No spinal stenosis or foraminal stenosis L4-L5: There is moderate degenerative disc disease with a mild disc bulge. There is facet arthropathy with facet enlargement. There is mild spinal stenosis. There is mild bilateral neural foraminal stenosis. L5-S1: Transitional level.  Normal disc and facet joints. IMPRESSION: Multilevel mild-to-moderate degenerative changes. Mild spinal stenosis at L2-3. Mild spinal stenosis at L4-5 due to disc bulge and facet arthropathy. There is mild bilateral neural foraminal stenosis. No significant change compared with the prior study from May 16, 2019. Electronically Signed   By: Nancyann Burns M.D.   On: 10/19/2023 09:32   MR BRAIN WO CONTRAST Result Date: 10/19/2023 CLINICAL DATA:  Neuro  deficit, acute, stroke suspected EXAM: MRI HEAD WITHOUT CONTRAST TECHNIQUE: Multiplanar, multiecho pulse sequences of the brain and surrounding structures were obtained without intravenous contrast. COMPARISON:  CT head 10/18/2023. MRI head February 16 21. FINDINGS: Brain: Multiple prior infarcts in the basal ganglia bilaterally. Prior left cerebellar infarct. Patchy T2/FLAIR hyperintensities in the white matter, compatible chronic microvascular ischemic disease. No evidence of acute infarct, acute hemorrhage, mass lesion, midline shift or hydrocephalus. Vascular: Major arterial flow voids are maintained skull base. Skull and upper cervical spine: Normal marrow signal. Sinuses/Orbits: Negative. IMPRESSION: 1. No evidence of acute intracranial abnormality. 2. Remote basal ganglia and cerebellar lacunar infarcts and chronic microvascular ischemic disease. Electronically Signed   By: Gilmore GORMAN Molt M.D.   On: 10/19/2023 02:31   CT Head Wo Contrast Result Date: 10/18/2023 EXAM: CT HEAD WITHOUT CONTRAST 10/18/2023 01:14:20 PM TECHNIQUE: CT of the head was performed without the administration of intravenous contrast. Automated exposure control, iterative  reconstruction, and/or weight based adjustment of the mA/kV was utilized to reduce the radiation dose to as low as reasonably achievable. COMPARISON: 10/24/2022 CLINICAL HISTORY: Head trauma, minor (Age >= 65y). Patient states fall on Sunday. Hit back of head. States her primary care doctor wanted her to come get a CT scan. Denies blood thinners. FINDINGS: BRAIN AND VENTRICLES: No acute hemorrhage. No evidence of acute infarct. No hydrocephalus. No extra-axial collection. No mass effect or midline shift. Nonspecific hypoattenuation in the periventricular and subcortical white matter, most likely representing chronic microvascular ischemic changes. Remote lacunar infarcts in the bilateral basal ganglia. Remote infarct in the left cerebellum. ORBITS: No acute abnormality. SINUSES: No acute abnormality. SOFT TISSUES AND SKULL: No acute soft tissue abnormality. No skull fracture. IMPRESSION: 1. No acute intracranial abnormality. 2. Remote infarct in the left cerebellum, new since 2024. 3. Remote lacunar infarcts in the bilateral basal ganglia. 4. Mild chronic microvascular ischemic changes. Electronically signed by: Donnice Mania MD 10/18/2023 01:31 PM EDT RP Workstation: [id number]   CT Lumbar Spine Wo Contrast Result Date: 10/18/2023 CLINICAL DATA:  Back trauma, no prior imaging (Age >= 16y). Recent fall. EXAM: CT LUMBAR SPINE WITHOUT CONTRAST TECHNIQUE: Multidetector CT imaging of the lumbar spine was performed without intravenous contrast administration. Multiplanar CT image reconstructions were also generated. RADIATION DOSE REDUCTION: This exam was performed according to the departmental dose-optimization program which includes automated exposure control, adjustment of the mA and/or kV according to patient size and/or use of iterative reconstruction technique. COMPARISON:  Lumbar spine MRI 05/16/2019.  CT chest 11/21/2019. FINDINGS: Segmentation: There is transitional lumbosacral anatomy. When correlating with  the prior chest CT, there are no ribs at T12 and the transitional segment is a partially sacralized L5. Alignment: Chronic grade 1 retrolisthesis of L1 on L2. Vertebrae: Nondisplaced, acute appearing right L2 transverse process fracture. Unchanged chronic compression fracture and prominent Schmorl's node deformity involving the L1 inferior endplate. No new compression fracture. No suspicious bone lesion. Paraspinal and other soft tissues: Abdominal aortic atherosclerosis. Disc levels: At L2-3, disc bulging, a suspected new central disc protrusion, prominent dorsal epidural fat, and posterior element hypertrophy result in increased, moderate spinal stenosis and mild bilateral neural foraminal stenosis. Disc bulging and posterior element hypertrophy also result in mild spinal stenosis at L3-4 and moderate to severe spinal stenosis at L4-5 with the latter having progressed. There is moderate bilateral neural foraminal stenosis at L4-5. IMPRESSION: 1. Acute right L2 transverse process fracture. 2. Progressive lumbar disc and facet degeneration with moderate to severe spinal stenosis at L4-5 and  moderate spinal stenosis at L2-3. 3.  Aortic Atherosclerosis (ICD10-I70.0). Electronically Signed   By: Dasie Hamburg M.D.   On: 10/18/2023 13:30   CT Cervical Spine Wo Contrast Result Date: 10/18/2023 EXAM: CT CERVICAL SPINE WITHOUT CONTRAST 10/18/2023 01:14:20 PM TECHNIQUE: CT of the cervical spine was performed without the administration of intravenous contrast. Multiplanar reformatted images are provided for review. Automated exposure control, iterative reconstruction, and/or weight based adjustment of the mA/kV was utilized to reduce the radiation dose to as low as reasonably achievable. COMPARISON: CT cervical spine 10/24/2022. CLINICAL HISTORY: Neck trauma (Age >= 65y). Patient states fall on Sunday. Hit back of head. States her primary care doctor wanted her to come get a CT scan. Denies blood thinners. FINDINGS:  CERVICAL SPINE: BONES AND ALIGNMENT: No acute fracture or traumatic malalignment. DEGENERATIVE CHANGES: Degenerative changes and osteophytes at multiple levels. Degenerative changes of the dens. Disc space narrowing most pronounced at C5-6. Small disc bulges at multiple levels. Facet arthrosis and uncovertebral hypertrophy at multiple levels with foraminal stenosis noted, most pronounced at C5-6. There is no high-grade osseous spinal canal stenosis. SOFT TISSUES: No prevertebral soft tissue swelling. IMPRESSION: 1. No acute abnormality of the cervical spine. 2. Degenerative changes most pronounced at C5-6. Electronically signed by: Donnice Mania MD 10/18/2023 01:26 PM EDT RP Workstation: [id number]     The results of significant diagnostics from this hospitalization (including imaging, microbiology, ancillary and laboratory) are listed below for reference.     Microbiology: No results found for this or any previous visit (from the past 240 hours).   Labs: BNP (last 3 results) Recent Labs    01/25/23 1916  BNP 58.9   Basic Metabolic Panel: Recent Labs  Lab 10/17/23 1628 10/18/23 1340 10/19/23 0748  NA 138 137 138  K 4.3 3.8 4.0  CL 99 99 101  CO2 30 26 26   GLUCOSE 75 105* 92  BUN 11 7* 9  CREATININE 0.66 0.75 0.74  CALCIUM  9.3 9.6 8.8*  MG 1.6  --  1.7  PHOS  --   --  3.3   Liver Function Tests: Recent Labs  Lab 10/17/23 1628  AST 20  ALT 11  ALKPHOS 64  BILITOT 0.7  PROT 7.0  ALBUMIN 4.1   No results for input(s): LIPASE, AMYLASE in the last 168 hours. No results for input(s): AMMONIA in the last 168 hours. CBC: Recent Labs  Lab 10/17/23 1628 10/18/23 1340 10/19/23 0748  WBC 4.1 3.3* 3.5*  NEUTROABS 2.6 2.0  --   HGB 11.6* 11.6* 11.2*  HCT 34.6* 34.5* 33.6*  MCV 92.7 93.8 92.8  PLT 194.0 151 147*   Cardiac Enzymes: No results for input(s): CKTOTAL, CKMB, CKMBINDEX, TROPONINI in the last 168 hours. BNP: Invalid input(s): POCBNP CBG: No  results for input(s): GLUCAP in the last 168 hours. D-Dimer No results for input(s): DDIMER in the last 72 hours. Hgb A1c No results for input(s): HGBA1C in the last 72 hours. Lipid Profile No results for input(s): CHOL, HDL, LDLCALC, TRIG, CHOLHDL, LDLDIRECT in the last 72 hours. Thyroid  function studies Recent Labs    10/19/23 0748  TSH 3.820   Anemia work up Recent Labs    10/17/23 1628  VITAMINB12 1,232*  FERRITIN 40.6   Urinalysis    Component Value Date/Time   COLORURINE YELLOW 10/18/2023 1425   APPEARANCEUR CLEAR 10/18/2023 1425   LABSPEC 1.019 10/18/2023 1425   PHURINE 7.0 10/18/2023 1425   GLUCOSEU NEGATIVE 10/18/2023 1425   GLUCOSEU NEGATIVE 10/17/2023 1628  HGBUR NEGATIVE 10/18/2023 1425   BILIRUBINUR NEGATIVE 10/18/2023 1425   KETONESUR NEGATIVE 10/18/2023 1425   PROTEINUR NEGATIVE 10/18/2023 1425   UROBILINOGEN 0.2 10/17/2023 1628   NITRITE NEGATIVE 10/18/2023 1425   LEUKOCYTESUR TRACE (A) 10/18/2023 1425   Sepsis Labs Recent Labs  Lab 10/17/23 1628 10/18/23 1340 10/19/23 0748  WBC 4.1 3.3* 3.5*   Microbiology No results found for this or any previous visit (from the past 240 hours).   Time coordinating discharge:  I have spent 35 minutes face to face with the patient and on the ward discussing the patients care, assessment, plan and disposition with other care givers. >50% of the time was devoted counseling the patient about the risks and benefits of treatment/Discharge disposition and coordinating care.   SIGNED:   Burgess JAYSON Dare, MD  [facility] 10/19/2023, 12:36 PM   If 7PM-7AM, please contact night-coverage

## 2023-10-19 NOTE — Progress Notes (Signed)
 PT Cancellation Note  Patient Details Name: Anna Lyons MRN: [id number] DOB: [birth year]   Cancelled Treatment:    Reason Eval/Treat Not Completed: Patient at procedure or test/unavailable. Pt is off the unit at MRI. PT will follow up at a later time.   [name] 10/19/2023, 9:01 AM
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
## 2023-10-19 NOTE — Evaluation (Signed)
 Occupational Therapy Evaluation Patient Details Name: Anna Lyons MRN: [id number] DOB: 08/24/1955 Today's Date: 10/19/2023   History of Present Illness   68 y.o. female presents to [facility] hospital on 10/18/2023 due to more frequent falls. PMH includes CVA, lumbar spondylosis, seizure disorder, mild cognitive impairment, breast CA, HTN, RLS, mood disorder.     Clinical Impressions Anna Lyons was evaluated s/p the above admission list. She lives alone, works and is mod I with rollator at baseline, however she has a significant fall history. Pt reports she feels dizzy before she falls. Upon evaluation the pt was limited by unsteady balance, weakness, limited insight and dizziness with R lateral lean>sit and R head turns. Overall she needed CGA for functional mobility with rollator and cues for safety and gaze stabilization. Due to the deficits listed below the pt also needs up to CGA for ADLs. Pt will benefit from continued acute OT services and OPOT.      If plan is discharge home, recommend the following:   A little help with walking and/or transfers;A little help with bathing/dressing/bathroom;Assistance with cooking/housework;Assist for transportation;Help with stairs or ramp for entrance     Functional Status Assessment   Patient has had a recent decline in their functional status and demonstrates the ability to make significant improvements in function in a reasonable and predictable amount of time.     Equipment Recommendations   None recommended by OT      Precautions/Restrictions   Precautions Precautions: Fall Recall of Precautions/Restrictions: Impaired (limited insight to fall risk/safety) Restrictions Weight Bearing Restrictions Per Provider Order: No     Mobility Bed Mobility Overal bed mobility: Needs Assistance Bed Mobility: Sit to Sidelying, Sidelying to Sit   Sidelying to sit: Supervision     Sit to sidelying: Supervision General bed mobility comments:  assessed bed mobility which elicited dizziness, more so with R side>sit    Transfers Overall transfer level: Needs assistance   Transfers: Sit to/from Stand Sit to Stand: Contact guard assist           General transfer comment: supervision initially, CGA with dizziness      Balance Overall balance assessment: Needs assistance Sitting-balance support: Feet supported Sitting balance-Leahy Scale: Fair Sitting balance - Comments: unsteady when dizzy   Standing balance support: During functional activity Standing balance-Leahy Scale: Fair                             ADL either performed or assessed with clinical judgement   ADL Overall ADL's : Needs assistance/impaired Eating/Feeding: Independent   Grooming: Supervision/safety;Standing   Upper Body Bathing: Set up;Sitting   Lower Body Bathing: Contact guard assist;Sit to/from stand   Upper Body Dressing : Set up;Sitting   Lower Body Dressing: Contact guard assist;Sit to/from stand   Toilet Transfer: Contact guard assist;Ambulation;Rollator (4 wheels)   Toileting- Clothing Manipulation and Hygiene: Supervision/safety;Sitting/lateral lean       Functional mobility during ADLs: Contact guard assist;Rollator (4 wheels) General ADL Comments: limited by dizziness that causes unsteady balance     Vision Baseline Vision/History: 0 No visual deficits Vision Assessment?: No apparent visual deficits Additional Comments: room spinning dizziness that blurrs her vision intermittently     Perception Perception: Not tested       Praxis Praxis: Not tested       Pertinent Vitals/Pain Pain Assessment Pain Assessment: No/denies pain     Extremity/Trunk Assessment Upper Extremity Assessment Upper Extremity Assessment: Generalized weakness  Lower Extremity Assessment Lower Extremity Assessment: Defer to PT evaluation   Cervical / Trunk Assessment Cervical / Trunk Assessment: Normal   Communication  Communication Communication: No apparent difficulties   Cognition Arousal: Alert Behavior During Therapy: WFL for tasks assessed/performed Cognition: History of cognitive impairments             OT - Cognition Comments: hx of minld cog impairment                 Following commands: Intact       Cueing  General Comments   Cueing Techniques: Verbal cues  VSS on RA   Exercises     Shoulder Instructions      Home Living Family/patient expects to be discharged to:: Private residence Living Arrangements: Alone Available Help at Discharge: Family;Available PRN/intermittently               Bathroom Shower/Tub: Tub/shower unit         Home Equipment: Rollator (4 wheels);Shower seat          Prior Functioning/Environment Prior Level of Function : Independent/Modified Independent;Working/employed;Driving             Mobility Comments: rollator use most of the time, several recent falls ADLs Comments: mod I, works at [employer] the [employer] Problem List: Decreased strength;Decreased range of motion;Decreased activity tolerance;Impaired balance (sitting and/or standing);Decreased safety awareness;Decreased knowledge of use of DME or AE;Decreased knowledge of precautions   OT Treatment/Interventions: Self-care/ADL training;Therapeutic exercise;DME and/or AE instruction;Therapeutic activities;Patient/family education;Balance training      OT Goals(Current goals can be found in the care plan section)   Acute Rehab OT Goals Patient Stated Goal: no more falls OT Goal Formulation: With patient Time For Goal Achievement: 11/02/23 Potential to Achieve Goals: Good ADL Goals Pt Will Perform Grooming: with modified independence Pt Will Perform Lower Body Dressing: with modified independence Pt Will Transfer to Toilet: with modified independence Additional ADL Goal #1: pt will indep use gaze stabilization to mitigate dizziness during  functional tasks   OT Frequency:  Min 2X/week    Co-evaluation              AM-PAC OT 6 Clicks Daily Activity     Outcome Measure Help from another person eating meals?: None Help from another person taking care of personal grooming?: A Little Help from another person toileting, which includes using toliet, bedpan, or urinal?: A Little Help from another person bathing (including washing, rinsing, drying)?: A Little Help from another person to put on and taking off regular upper body clothing?: A Little Help from another person to put on and taking off regular lower body clothing?: A Little 6 Click Score: 19   End of Session Equipment Utilized During Treatment: Rollator (4 wheels) Nurse Communication: Mobility status  Activity Tolerance: Patient tolerated treatment well Patient left: in chair;with call bell/phone within reach;with chair alarm set  OT Visit Diagnosis: Unsteadiness on feet (R26.81);Other abnormalities of gait and mobility (R26.89);Muscle weakness (generalized) (M62.81)                Time: 9059-9041 OT Time Calculation (min): 18 min Charges:  OT General Charges $OT Visit: 1 Visit OT Evaluation $OT Eval Moderate Complexity: 1 Mod  Lucie Kendall, OTR/L Acute Rehabilitation Services Office [phone] Secure Chat Communication Preferred   Lucie JONETTA Kendall 10/19/2023, 10:56 AM

## 2023-10-19 NOTE — Plan of Care (Signed)
   Problem: Education: Goal: Knowledge of General Education information will improve Description Including pain rating scale, medication(s)/side effects and non-pharmacologic comfort measures Outcome: Progressing   Problem: Health Behavior/Discharge Planning: Goal: Ability to manage health-related needs will improve Outcome: Progressing

## 2023-10-19 NOTE — Progress Notes (Signed)
   10/19/23 0100  Vitals  Temp 98.1 F (36.7 C)  Temp Source Oral  BP (!) 146/89  MAP (mmHg) 105  BP Location Right Arm  BP Method Automatic  Patient Position (if appropriate) Lying  Pulse Rate (!) 57  Pulse Rate Source Dinamap  Resp 18  MEWS COLOR  MEWS Score Color Green  Oxygen  Therapy  SpO2 94 %  O2 Device Room Air  MEWS Score  MEWS Temp 0  MEWS Systolic 0  MEWS Pulse 0  MEWS RR 0  MEWS LOC 0  MEWS Score 0   New admit to room 6N28, alert and oriented. Oriented to room, staff, fall precautions, and call light system. Plan of care reviewed at this time.

## 2023-10-19 NOTE — Care Management Obs Status (Signed)
 MEDICARE OBSERVATION STATUS NOTIFICATION   Patient Details  Name: Anna Lyons MRN: [id number] Date of Birth: [birth year]   Medicare Observation Status Notification Given:       Stephane Powell Jansky, RN 10/19/2023, 1:29 PM

## 2023-10-19 NOTE — Evaluation (Signed)
 Physical Therapy Evaluation Patient Details Name: Anna Lyons MRN: [id number] DOB: [birth year] Today's Date: 10/19/2023  History of Present Illness  68 y.o. female presents to [facility] hospital on 10/18/2023 due to more frequent falls. PMH includes CVA, lumbar spondylosis, seizure disorder, mild cognitive impairment, breast CA, HTN, RLS, mood disorder.  Clinical Impression  Pt presents to PT with deficits in balance, gait, endurance. Pt is able to mobilize without physical assistance at this time, orthostatic vitals negative. PT provides cues to maintain UE support of rollator until turned all the way to destination when transferring in an effort to reduce potential risk for falls. PT recommends outpatient PT for further gait and balance training.        If plan is discharge home, recommend the following: A little help with walking and/or transfers;A little help with bathing/dressing/bathroom;Assistance with cooking/housework;Help with stairs or ramp for entrance;Assist for transportation   Can travel by private vehicle        Equipment Recommendations None recommended by PT  Recommendations for Other Services       Functional Status Assessment Patient has had a recent decline in their functional status and demonstrates the ability to make significant improvements in function in a reasonable and predictable amount of time.     Precautions / Restrictions Precautions Precautions: Fall Recall of Precautions/Restrictions: Impaired Restrictions Weight Bearing Restrictions Per Provider Order: No      Mobility  Bed Mobility Overal bed mobility: Needs Assistance Bed Mobility: Supine to Sit   Sidelying to sit: Supervision            Transfers Overall transfer level: Needs assistance Equipment used: Rollator (4 wheels) Transfers: Sit to/from Stand Sit to Stand: Supervision                Ambulation/Gait Ambulation/Gait assistance: Supervision Gait Distance (Feet): 150  Feet Assistive device: Rollator (4 wheels) Gait Pattern/deviations: Step-through pattern Gait velocity: reduced Gait velocity interpretation: 1.31 - 2.62 ft/sec, indicative of limited community ambulator   General Gait Details: steady step-through gait  Stairs            Wheelchair Mobility     Tilt Bed    Modified Rankin (Stroke Patients Only)       Balance Overall balance assessment: Needs assistance Sitting-balance support: No upper extremity supported, Feet supported Sitting balance-Leahy Scale: Good     Standing balance support: Single extremity supported, Reliant on assistive device for balance Standing balance-Leahy Scale: Poor                               Pertinent Vitals/Pain Pain Assessment Pain Assessment: 0-10 Pain Score: 9  Pain Location: neck/head Pain Descriptors / Indicators: Headache Pain Intervention(s): Monitored during session    Home Living Family/patient expects to be discharged to:: Private residence Living Arrangements: Alone Available Help at Discharge: Family;Available PRN/intermittently Type of Home: Apartment Home Access: Level entry       Home Layout: One level Home Equipment: Rollator (4 wheels);Shower seat      Prior Function Prior Level of Function : Independent/Modified Independent;Working/employed;Driving             Mobility Comments: rollator use most of the time, several recent falls ADLs Comments: mod I, works at [employer] the [employer] Assessment   Upper Extremity Assessment Upper Extremity Assessment: Generalized weakness    Lower Extremity Assessment Lower Extremity Assessment: Defer to PT evaluation  Cervical / Trunk Assessment Cervical / Trunk Assessment: Normal  Communication   Communication Communication: No apparent difficulties Factors Affecting Communication: Difficulty expressing self;Reduced clarity of speech    Cognition Arousal:  Alert Behavior During Therapy: WFL for tasks assessed/performed   PT - Cognitive impairments: Safety/Judgement, Memory                         Following commands: Intact       Cueing Cueing Techniques: Verbal cues     General Comments General comments (skin integrity, edema, etc.): VSS on RA    Exercises     Assessment/Plan    PT Assessment Patient needs continued PT services  PT Problem List Decreased strength;Decreased activity tolerance;Decreased mobility;Decreased balance;Decreased knowledge of use of DME;Decreased safety awareness;Decreased knowledge of precautions       PT Treatment Interventions DME instruction;Gait training;Functional mobility training;Therapeutic activities;Therapeutic exercise;Balance training;Neuromuscular re-education;Patient/family education;Canalith reposition    PT Goals (Current goals can be found in the Care Plan section)  Acute Rehab PT Goals Patient Stated Goal: to return to independence, stop falling and dizziness PT Goal Formulation: With patient Time For Goal Achievement: 11/02/23 Potential to Achieve Goals: Fair Additional Goals Additional Goal #1: Pt will score >19/24 on the DGI to indicate a reduced risk for falls    Frequency Min 3X/week     Co-evaluation               AM-PAC PT 6 Clicks Mobility  Outcome Measure Help needed turning from your back to your side while in a flat bed without using bedrails?: A Little Help needed moving from lying on your back to sitting on the side of a flat bed without using bedrails?: A Little Help needed moving to and from a bed to a chair (including a wheelchair)?: A Little Help needed standing up from a chair using your arms (e.g., wheelchair or bedside chair)?: A Little Help needed to walk in hospital room?: A Little Help needed climbing 3-5 steps with a railing? : A Little 6 Click Score: 18    End of Session Equipment Utilized During Treatment: Gait belt Activity  Tolerance: Patient tolerated treatment well Patient left: with call bell/phone within reach;in chair;with chair alarm set Nurse Communication: Mobility status PT Visit Diagnosis: Other abnormalities of gait and mobility (R26.89);Muscle weakness (generalized) (M62.81);Dizziness and giddiness (R42)    Time: 9094-9061 PT Time Calculation (min) (ACUTE ONLY): 33 min   Charges:   PT Evaluation $PT Eval Low Complexity: 1 Low   PT General Charges $$ ACUTE PT VISIT: 1 Visit         [name], PT, DPT Acute Rehabilitation Office [phone]   [name] 10/19/2023, 11:01 AM

## 2023-10-19 NOTE — Care Management CC44 (Signed)
 Condition Code 44 Documentation Completed  Patient Details  Name: GARGI BERCH MRN: [id number] Date of Birth: 10/29/1955   Condition Code 44 given:  Yes Patient signature on Condition Code 44 notice:  Yes Documentation of 2 MD's agreement:  Yes Code 44 added to claim:  Yes    Stephane Powell Jansky, RN 10/19/2023, 1:29 PM

## 2023-10-19 NOTE — Progress Notes (Signed)
 DISCHARGE  Patient verbalized understanding of discharge POC.  RW with seat at bedside.   PIV removed and pressure dressing applied.   CDI.   RIDE at bedside. Transfer to Main A/

## 2023-10-19 NOTE — Telephone Encounter (Signed)
 I have removed Cymbalta  from her medication list.

## 2023-10-19 NOTE — H&P (Signed)
 History and Physical    Anna Lyons FMW:[id number] DOB: 11/27/1955 DOA: 10/18/2023  PCP: Rollene Almarie LABOR, MD   Patient coming from: Home   Chief Complaint:  Chief Complaint  Patient presents with   Fall    HPI:  Anna Lyons is a 68 y.o. female with hx of prior stroke ' 13 (L lacunar; treated with tPA), Lumbar spondylosis with associated neuroforaminal stenosis, + spinal stenosis.; was to be referred to neurosurgery in '21 although unclear if seen; appears previously she had intact strength in the R leg when seen by neurology in '21; additional hx including seizure disorder, mild cognitive impairment, hx breast cancer, HTN, RLS, mood d/o, who presented due to more frequent falls and transferred from [facility] ED for stroke evaluation.   Per patients report she has very frequent falls, estimates about 10 per month, and attributes to feeling of imbalance, sometimes lightheadedness, and right leg weakness. Describes that her R leg has been dragging, onset unclear per patient; from ED record daughter had reported this happening over past 2-3 weeks, and worse balance from baseline. recently falls were happening back to back, had 2 falls in 3 days, and sought care and was referred to the ED for evaluation.   She has hit her head during falls, she denies any specific sites of injuries. Has headaches. No vision changes, speech changes, focal numbness; does have weakness most in the R leg. And balance issues as mentioned. Otherwise denies recent illness. Notes that she had had decreased PO intake recently, although trying to keep up with hydration.    Review of Systems:  ROS complete and negative except as marked above   Allergies  Allergen Reactions   Codeine Itching   Oxycodone -Acetaminophen  Hives    Prior to Admission medications   Medication Sig Start Date End Date Taking? Authorizing Provider  aspirin  EC 81 MG tablet Take 81 mg by mouth daily.   Yes [provider]   atorvastatin  (LIPITOR ) 80 MG tablet Take 1 tablet (80 mg total) by mouth at bedtime. 05/10/23  Yes Rollene Almarie LABOR, MD  buPROPion  (WELLBUTRIN  XL) 300 MG 24 hr tablet Take 1 tablet (300 mg total) by mouth daily. 09/23/23  Yes Teresa Rogue A, NP  cholecalciferol (VITAMIN D3) 25 MCG (1000 UNIT) tablet Take 1,000 Units by mouth daily.   Yes [provider]  escitalopram  (LEXAPRO ) 20 MG tablet Take 1 tablet (20 mg total) by mouth daily. 09/23/23  Yes White, Brian A, NP  ferrous sulfate  324 MG TBEC Take 324 mg by mouth.   Yes [provider]  gabapentin  (NEURONTIN ) 300 MG capsule Take 1 capsule (300 mg total) by mouth 2 (two) times daily. Patient taking differently: Take 300 mg by mouth daily. 05/10/23  Yes Rollene Almarie LABOR, MD  levETIRAcetam  (KEPPRA ) 500 MG tablet Take 1 tablet (500 mg total) by mouth 2 (two) times daily. 05/10/23  Yes Rollene Almarie LABOR, MD  Magnesium  250 MG TABS Take by mouth.   Yes [provider]  Multiple Vitamin (MULTIVITAMIN WITH MINERALS) TABS tablet Take 1 tablet by mouth daily.   Yes [provider]  pramipexole  (MIRAPEX ) 0.5 MG tablet TAKE 1 TABLET BY MOUTH AT BEDTIME AS NEEDED 10/10/23  Yes Rollene Almarie LABOR, MD  temazepam  (RESTORIL ) 15 MG capsule Take 1 capsule (15 mg total) by mouth at bedtime. 05/10/23  Yes Rollene Almarie LABOR, MD  vitamin B-12 (CYANOCOBALAMIN ) 1000 MCG tablet Take 1,000 mcg by mouth daily.   Yes [provider]  DULoxetine  (CYMBALTA ) 60 MG capsule Take 1 capsule (60 mg total) by mouth daily. Patient not taking: Reported on 10/18/2023 05/10/23   Rollene Almarie LABOR, MD    Past Medical History:  Diagnosis Date   Angina    Breast cyst    Cancer [facility])    Depression    Hypertension    RLS (restless legs syndrome) 11/16/2011   Seizures (HCC) 2016   last seizure=last year, 2016 per pt.   Stroke (HCC) 04/20/2011   a. 04/20/11 Left subcortical infarct treated w/ TPA;  b. 04/2011 Echo: EF 55-60%;  c.  04/2011 Normal Carotid u/s  d. Residual walk w/limp on right; unable to grasp w/right hand   TIA (transient ischemic attack) 08/2010   Tobacco abuse    a. quit @ time of CVA 04/2011.    Past Surgical History:  Procedure Laterality Date   BREAST RECONSTRUCTION     bilaterally   CARPAL TUNNEL RELEASE Left 1990's   CARPAL TUNNEL RELEASE Left 12/08/2015   CESAREAN SECTION  1979   KNEE ARTHROSCOPY Left 1990's    cartilage repair   LEFT HEART CATHETERIZATION WITH CORONARY ANGIOGRAM N/A 05/20/2011   Procedure: LEFT HEART CATHETERIZATION WITH CORONARY ANGIOGRAM;  Surgeon: Maude JAYSON Emmer, MD;  Location: [facility] CATH LAB;  Service: Cardiovascular;  Laterality: N/A;   MASTECTOMY Bilateral 1980's   bilateral with reconstruction, breast cancer on left     reports that she has quit smoking. Her smoking use included cigarettes. She started smoking about 50 years ago. She has a 38 pack-year smoking history. She has been exposed to tobacco smoke. She has never used smokeless tobacco. She reports current alcohol use of about 1.0 standard drink of alcohol per week. She reports that she does not use drugs.  Family History  Problem Relation Age of Onset   Depression Mother    Anxiety disorder Mother    Lupus Mother        died early 38's.   Alcohol abuse Father    Emphysema Father        died late 46's.   COPD Father    Sexual abuse Brother    Alcohol abuse Brother    Suicidality Brother    Pancreatic cancer Brother    Severe combined immunodeficiency Brother    Alcohol abuse Brother    Bladder Cancer Brother    Alcohol abuse Brother    Rectal cancer Brother    Alcohol abuse Brother    Suicidality Brother    Alcohol abuse Brother    Colon cancer Neg Hx      Physical Exam: Vitals:   10/19/23 0000 10/19/23 0015 10/19/23 0033 10/19/23 0100  BP: (!) 141/85   (!) 146/89  Pulse: (!) 55 (!) 57  (!) 57  Resp:    18  Temp:   98.1 F (36.7 C) 98.1 F (36.7 C)  TempSrc:   Oral Oral  SpO2: 91%  93%  94%    Gen: Awake, alert, NAD   CV: Regular, normal S1, S2, 1/6 SEM  Resp: Normal WOB, CTAB  Abd: Flat, normoactive, nontender MSK: Symmetric, no edema  Skin: No rashes or lesions to exposed skin  Neuro: Alert and interactive, Cn 2-12 intact. Motor is 4+/5 in the RUE, 5/5 in the LUE. In the lower extremities, is 3/5 in the RLE, and 5/5 in the LLE. Sensation was not completely tested. Appears more hypertonic in the RLE.  Psych: euthymic, appropriate    Data review:  Labs reviewed, notable for:   Chemistries unremarkable  WBC 3.3  Hb 11  UA neg   Micro:  Results for orders placed or performed during the hospital encounter of 05/25/23  Culture, group A strep     Status: None   Collection Time: 05/25/23  5:11 PM   Specimen: Throat  Result Value Ref Range Status   Specimen Description THROAT  Final   Special Requests NONE  Final   Culture   Final    NO GROUP A STREP (S.PYOGENES) ISOLATED Performed at [facility], [address] [place], [place] [address]    Report Status 05/28/2023 FINAL  Final    Imaging reviewed:  MR BRAIN WO CONTRAST Result Date: 10/19/2023 CLINICAL DATA:  Neuro deficit, acute, stroke suspected EXAM: MRI HEAD WITHOUT CONTRAST TECHNIQUE: Multiplanar, multiecho pulse sequences of the brain and surrounding structures were obtained without intravenous contrast. COMPARISON:  CT head 10/18/2023. MRI head February 16 21. FINDINGS: Brain: Multiple prior infarcts in the basal ganglia bilaterally. Prior left cerebellar infarct. Patchy T2/FLAIR hyperintensities in the white matter, compatible chronic microvascular ischemic disease. No evidence of acute infarct, acute hemorrhage, mass lesion, midline shift or hydrocephalus. Vascular: Major arterial flow voids are maintained skull base. Skull and upper cervical spine: Normal marrow signal. Sinuses/Orbits: Negative. IMPRESSION: 1. No evidence of acute intracranial abnormality. 2. Remote basal ganglia and  cerebellar lacunar infarcts and chronic microvascular ischemic disease. Electronically Signed   By: Gilmore GORMAN Molt M.D.   On: 10/19/2023 02:31   CT Head Wo Contrast Result Date: 10/18/2023 EXAM: CT HEAD WITHOUT CONTRAST 10/18/2023 01:14:20 PM TECHNIQUE: CT of the head was performed without the administration of intravenous contrast. Automated exposure control, iterative reconstruction, and/or weight based adjustment of the mA/kV was utilized to reduce the radiation dose to as low as reasonably achievable. COMPARISON: 10/24/2022 CLINICAL HISTORY: Head trauma, minor (Age >= 65y). Patient states fall on Sunday. Hit back of head. States her primary care doctor wanted her to come get a CT scan. Denies blood thinners. FINDINGS: BRAIN AND VENTRICLES: No acute hemorrhage. No evidence of acute infarct. No hydrocephalus. No extra-axial collection. No mass effect or midline shift. Nonspecific hypoattenuation in the periventricular and subcortical white matter, most likely representing chronic microvascular ischemic changes. Remote lacunar infarcts in the bilateral basal ganglia. Remote infarct in the left cerebellum. ORBITS: No acute abnormality. SINUSES: No acute abnormality. SOFT TISSUES AND SKULL: No acute soft tissue abnormality. No skull fracture. IMPRESSION: 1. No acute intracranial abnormality. 2. Remote infarct in the left cerebellum, new since 2024. 3. Remote lacunar infarcts in the bilateral basal ganglia. 4. Mild chronic microvascular ischemic changes. Electronically signed by: Donnice Mania MD 10/18/2023 01:31 PM EDT RP Workstation: [id number]   CT Lumbar Spine Wo Contrast Result Date: 10/18/2023 CLINICAL DATA:  Back trauma, no prior imaging (Age >= 16y). Recent fall. EXAM: CT LUMBAR SPINE WITHOUT CONTRAST TECHNIQUE: Multidetector CT imaging of the lumbar spine was performed without intravenous contrast administration. Multiplanar CT image reconstructions were also generated. RADIATION DOSE REDUCTION: This  exam was performed according to the departmental dose-optimization program which includes automated exposure control, adjustment of the mA and/or kV according to patient size and/or use of iterative reconstruction technique. COMPARISON:  Lumbar spine MRI 05/16/2019.  CT chest 11/21/2019. FINDINGS: Segmentation: There is transitional lumbosacral anatomy. When correlating with the prior chest CT, there are no ribs at T12 and the transitional segment is a partially sacralized L5. Alignment: Chronic grade 1 retrolisthesis of L1 on L2. Vertebrae:  Nondisplaced, acute appearing right L2 transverse process fracture. Unchanged chronic compression fracture and prominent Schmorl's node deformity involving the L1 inferior endplate. No new compression fracture. No suspicious bone lesion. Paraspinal and other soft tissues: Abdominal aortic atherosclerosis. Disc levels: At L2-3, disc bulging, a suspected new central disc protrusion, prominent dorsal epidural fat, and posterior element hypertrophy result in increased, moderate spinal stenosis and mild bilateral neural foraminal stenosis. Disc bulging and posterior element hypertrophy also result in mild spinal stenosis at L3-4 and moderate to severe spinal stenosis at L4-5 with the latter having progressed. There is moderate bilateral neural foraminal stenosis at L4-5. IMPRESSION: 1. Acute right L2 transverse process fracture. 2. Progressive lumbar disc and facet degeneration with moderate to severe spinal stenosis at L4-5 and moderate spinal stenosis at L2-3. 3.  Aortic Atherosclerosis (ICD10-I70.0). Electronically Signed   By: Dasie Hamburg M.D.   On: 10/18/2023 13:30   CT Cervical Spine Wo Contrast Result Date: 10/18/2023 EXAM: CT CERVICAL SPINE WITHOUT CONTRAST 10/18/2023 01:14:20 PM TECHNIQUE: CT of the cervical spine was performed without the administration of intravenous contrast. Multiplanar reformatted images are provided for review. Automated exposure control, iterative  reconstruction, and/or weight based adjustment of the mA/kV was utilized to reduce the radiation dose to as low as reasonably achievable. COMPARISON: CT cervical spine 10/24/2022. CLINICAL HISTORY: Neck trauma (Age >= 65y). Patient states fall on Sunday. Hit back of head. States her primary care doctor wanted her to come get a CT scan. Denies blood thinners. FINDINGS: CERVICAL SPINE: BONES AND ALIGNMENT: No acute fracture or traumatic malalignment. DEGENERATIVE CHANGES: Degenerative changes and osteophytes at multiple levels. Degenerative changes of the dens. Disc space narrowing most pronounced at C5-6. Small disc bulges at multiple levels. Facet arthrosis and uncovertebral hypertrophy at multiple levels with foraminal stenosis noted, most pronounced at C5-6. There is no high-grade osseous spinal canal stenosis. SOFT TISSUES: No prevertebral soft tissue swelling. IMPRESSION: 1. No acute abnormality of the cervical spine. 2. Degenerative changes most pronounced at C5-6. Electronically signed by: Donnice Mania MD 10/18/2023 01:26 PM EDT RP Workstation: [id number]    EKG:  Personally reviewed, SR, with septal q waves, ant / lat TWI and diffuse TW flattening.   ED Course:   EDP consulted with Neurology Dr. Germaine, recommending for MRI brain for further evaluation.    Assessment/Plan:  68 y.o. female with hx  prior stroke ' 13 (L lacunar; treated with tPA), Lumbar spondylosis with associated neuroforaminal stenosis, + spinal stenosis.; was to be referred to neurosurgery in '21 although unclear if seen; appears previously she had intact strength in the R leg when seen by neurology in '21; additional hx including seizure disorder, mild cognitive impairment, hx breast cancer, HTN, RLS, mood d/o, who presented due to more frequent falls and transferred from [facility] ED for stroke evaluation, ultimately stroke eval negative concern for possible progressive L spine stenosis / neuroforaminal disease.   Recurrent  ground level falls,  RLE weakness, worsening subacutely Hx of R hemiparesis after stroke in '13  Hx lumbar spondylosis, neuroforaminal + spinal stenosis  Hx chronic frequent falls, with recent change over past few weeks with worse RLE weakness, + imbalance. CT Head negative for acute, old strokes seen. CT C spine negative for acute, C5-6 degenerative changes. And CT L spine with Acute R L2 transverse process fracture, and progressive spondylosis with mod-severe spinal stenosis at L4-5 and moderate at L2-3. Ultimately MRI is negative for acute stroke, she has old L cerebellar, and bilateral basal ganglia infarcts  noted. Concern possibly RLE symptoms related to compressive disease in L spine. Imbalance unclear why this would have changed but does have old cerebellar stroke. Recrudescence of stroke symptoms a possibility but no clear precipitating events.  -- MRI L spine without contrast further eval L spine stenosis /neuroforamina  -- PT / OT evaluation  -- Give 1 L of IVF, and check orthostatics.  -- Tele monitoring.   Acute L2 transverse process fracture;  -- Currently has minimal symptoms related to this. Tylenol  prn   Incidental findings  C spine degenerative change C5-6   Chronic medical problems:  Hx stroke '13: Continue home aspirin , atorvastatin .  Seizure disorder: Continue home Keppra  Mild cognitive impairment: Noted, she is fully oriented on exam.  Hx breast CA: hx bilateral mastectomy; outpatient survivorship  HTN: Not currently on antiHTN  RLS: Continue home Pramipexole   Mood d/o: Continue home Bupropion , Duloxetine , Temazepam  at bedtime  ? Chronic pain: Continue home Gabapentin       There is no height or weight on file to calculate BMI.    DVT prophylaxis:  Lovenox  Code Status:  DNR/DNI(Do NOT Intubate) Diet:  Diet Orders (From admission, onward)     Start     Ordered   10/19/23 0512  Diet Heart Room service appropriate? Yes; Fluid consistency: Thin  Diet effective now        Question Answer Comment  Room service appropriate? Yes   Fluid consistency: Thin      10/19/23 0512           Family Communication:  None   Consults:  None   Admission status:   Inpatient, Telemetry bed  Severity of Illness: The appropriate patient status for this patient is INPATIENT. Inpatient status is judged to be reasonable and necessary in order to provide the required intensity of service to ensure the patient's safety. The patient's presenting symptoms, physical exam findings, and initial radiographic and laboratory data in the context of their chronic comorbidities is felt to place them at high risk for further clinical deterioration. Furthermore, it is not anticipated that the patient will be medically stable for discharge from the hospital within 2 midnights of admission.   * I certify that at the point of admission it is my clinical judgment that the patient will require inpatient hospital care spanning beyond 2 midnights from the point of admission due to high intensity of service, high risk for further deterioration and high frequency of surveillance required.*   Dorn Dawson, MD [facility]  How to contact the [facility] Attending or Consulting provider 7A - 7P or covering provider during after hours 7P -7A, for this patient.  Check the care team in [facility] and look for a) attending/consulting [facility] provider listed and b) the [facility] team listed Log into [email or web address] and use [facility] universal password to access. If you do not have the password, please contact the hospital operator. Locate the [facility] provider you are looking for under [facility] and page to a number that you can be directly reached. If you still have difficulty reaching the provider, please page the [facility] (Director on Call) for the Hospitalists listed on amion for assistance.  10/19/2023, 5:13 AM

## 2023-10-19 NOTE — Progress Notes (Signed)
 Physical Therapy Quick Note  PT has completed initial evaluation.    Overall, patient at supervision assistance level.   PT Follow up recommended: Outpatient PT Equipment recommended:  None recommended Complete evaluation note to follow.     [name], PT, DPT Acute Rehabilitation Office [phone]

## 2023-10-19 NOTE — TOC Initial Note (Addendum)
 Transition of Care (TOC) - Initial/Assessment Note   Spoke to patient at bedside. Discussed recommendation for OP PT at Neuro rehab , patient in agreement. Order placed secure chatted MD to sign. Added to AVS   1445 Patient asking for tub transfer bench and rollator. Ordered with Jermaine with [employer], insurance will not cover tub transfer bench, cost $67.00 patient will buy elsewhere , they will deliver rollator to room in 30 minutes. Patient and visitor aware  Patient Details  Name: Anna Lyons MRN: [id number] Date of Birth: 1955/08/09  Transition of Care [facility]) CM/SW Contact:    Stephane Powell Jansky, RN Phone Number: 10/19/2023, 1:33 PM  Clinical Narrative:                   Expected Discharge Plan: Home/Self Care Barriers to Discharge: No Barriers Identified   Patient Goals and CMS Choice Patient states their goals for this hospitalization and ongoing recovery are:: to return to home CMS Medicare.gov Compare Post Acute Care list provided to:: Patient Choice offered to / list presented to : Patient      Expected Discharge Plan and Services   Discharge Planning Services: CM Consult Post Acute Care Choice:  (OP PT) Living arrangements for the past 2 months: Single Family Home Expected Discharge Date: 10/19/23               DME Arranged: N/A DME Agency: NA       HH Arranged: NA HH Agency: NA        Prior Living Arrangements/Services Living arrangements for the past 2 months: Single Family Home Lives with:: Self Patient language and need for interpreter reviewed:: Yes Do you feel safe going back to the place where you live?: Yes      Need for Family Participation in Patient Care: No (Comment) Care giver support system in place?: Yes (comment)   Criminal Activity/Legal Involvement Pertinent to Current Situation/Hospitalization: No - Comment as needed  Activities of Daily Living   ADL Screening (condition at time of admission) Independently performs ADLs?: Yes  (appropriate for developmental age) Is the patient deaf or have difficulty hearing?: No Does the patient have difficulty seeing, even when wearing glasses/contacts?: No Does the patient have difficulty concentrating, remembering, or making decisions?: No  Permission Sought/Granted   Permission granted to share information with : Yes, Verbal Permission Granted     Permission granted to share info w AGENCY: Neuro rehab on [address]        Emotional Assessment Appearance:: Appears stated age Attitude/Demeanor/Rapport: Engaged Affect (typically observed): Appropriate Orientation: : Oriented to Self, Oriented to Place, Oriented to  Time, Oriented to Situation Alcohol / Substance Use: Not Applicable Psych Involvement: No (comment)  Admission diagnosis:  Frequent falls [R29.6] Recurrent falls [R29.6] Weakness of right lower extremity [R29.898] Closed fracture of second lumbar vertebra, unspecified fracture morphology, initial encounter (HCC) [S32.029A] Weakness [R53.1] Patient Active Problem List   Diagnosis Date Noted   Recurrent falls 10/18/2023   Weakness of right lower extremity 10/18/2023   Pulmonary emphysema (HCC) 02/16/2023   Weakness 04/30/2021   Insomnia 10/03/2020   Dyslipidemia, goal LDL below 70 10/11/2019   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   Claudication (HCC) 02/28/2019   PTSD (post-traumatic stress disorder) 04/01/2017   Cough 03/04/2016   Routine general medical examination at a health care facility 11/15/2014   Hemiparesis affecting right side as late effect of cerebrovascular accident (HCC) 06/18/2013   Seizure disorder (HCC) 05/19/2012   Hypokalemia 11/16/2011   RLS (  restless legs syndrome) 11/16/2011   Essential hypertension, benign 05/19/2011   Tobacco abuse 05/19/2011   Moderate episode of recurrent major depressive disorder (HCC)    PCP:  Rollene Almarie LABOR, MD Pharmacy:   [facility] [address] [place] - [address] [place] [address] Phone: [phone] Fax: [phone]  [facility] [place] [facility] [id number] - [address] [place] - [address] [place] [address] Phone: [phone] Fax: [phone]     Social Drivers of Health (SDOH) Social History: SDOH Screenings   Food Insecurity: No Food Insecurity (10/19/2023)  Housing: Low Risk  (10/19/2023)  Transportation Needs: No Transportation Needs (10/19/2023)  Utilities: Not At Risk (10/19/2023)  Alcohol Screen: Low Risk  (05/12/2023)  Depression (PHQ2-9): High Risk (10/17/2023)  Financial Resource Strain: Low Risk  (05/12/2023)  Physical Activity: Insufficiently Active (05/12/2023)  Social Connections: Moderately Isolated (10/19/2023)  Stress: Stress Concern Present (05/12/2023)  Tobacco Use: Medium Risk (10/19/2023)  Health Literacy: Adequate Health Literacy (05/12/2023)   SDOH Interventions:     Readmission Risk Interventions     No data to display

## 2023-10-20 ENCOUNTER — Telehealth: Payer: Self-pay

## 2023-10-20 NOTE — Transitions of Care (Post Inpatient/ED Visit) (Signed)
 10/20/2023  Name: Anna Lyons MRN: [id number] DOB: 03-07-1955  Today's TOC FU Call Status: Today's TOC FU Call Status:: Successful TOC FU Call Completed TOC FU Call Complete Date: 10/20/23 Patient's Name and Date of Birth confirmed.  Transition Care Management Follow-up Telephone Call Date of Discharge: 10/19/23 Discharge Facility: [facility]) Type of Discharge: Inpatient Admission Primary Inpatient Discharge Diagnosis:: fractured vertebra How have you been since you were released from the hospital?: Better Any questions or concerns?: No  Items Reviewed: Did you receive and understand the discharge instructions provided?: Yes Medications obtained,verified, and reconciled?: Yes (Medications Reviewed) Any new allergies since your discharge?: No Dietary orders reviewed?: Yes Do you have support at home?: No  Medications Reviewed Today: Medications Reviewed Today     Reviewed by Emmitt Pan, LPN (Licensed Practical Nurse) on 10/20/23 at 1527  Med List Status: <None>   Medication Order Taking? Sig Documenting Provider Last Dose Status Informant  aspirin  EC 81 MG tablet [id number] Yes Take 81 mg by mouth daily. [provider]  Active Self  atorvastatin  (LIPITOR ) 80 MG tablet [id number] Yes Take 1 tablet (80 mg total) by mouth at bedtime. Rollene Almarie LABOR, MD  Active Self  buPROPion  (WELLBUTRIN  XL) 300 MG 24 hr tablet [id number] Yes Take 1 tablet (300 mg total) by mouth daily. Teresa Redell LABOR, NP  Active Self  cholecalciferol (VITAMIN D3) 25 MCG (1000 UNIT) tablet [id number] Yes Take 1,000 Units by mouth daily. [provider]  Active Self  escitalopram  (LEXAPRO ) 20 MG tablet [id number] Yes Take 1 tablet (20 mg total) by mouth daily. Teresa Redell LABOR, NP  Active Self  ferrous sulfate  324 MG TBEC [id number] Yes Take 324 mg by mouth. [provider]  Active Self  gabapentin  (NEURONTIN ) 300 MG capsule [id number] Yes Take 1 capsule (300 mg total) by mouth 2  (two) times daily.  Patient taking differently: Take 300 mg by mouth daily.   Rollene Almarie LABOR, MD  Active Self  levETIRAcetam  (KEPPRA ) 500 MG tablet [id number] Yes Take 1 tablet (500 mg total) by mouth 2 (two) times daily. Rollene Almarie LABOR, MD  Active Self  Magnesium  250 MG TABS [id number] Yes Take by mouth. [provider]  Active Self  Multiple Vitamin (MULTIVITAMIN WITH MINERALS) TABS tablet [id number] Yes Take 1 tablet by mouth daily. [provider]  Active Self  pramipexole  (MIRAPEX ) 0.5 MG tablet [id number] Yes TAKE 1 TABLET BY MOUTH AT BEDTIME AS NEEDED Rollene Almarie LABOR, MD  Active Self  temazepam  (RESTORIL ) 15 MG capsule [id number] Yes Take 1 capsule (15 mg total) by mouth at bedtime. Rollene Almarie LABOR, MD  Active Self  vitamin B-12 (CYANOCOBALAMIN ) 1000 MCG tablet [id number] Yes Take 1,000 mcg by mouth daily. [provider]  Active Self            Home Care and Equipment/Supplies: Were Home Health Services Ordered?: NA Any new equipment or medical supplies ordered?: Yes Name of Medical supply agency?: unknown Were you able to get the equipment/medical supplies?: Yes Do you have any questions related to the use of the equipment/supplies?: No  Functional Questionnaire: Do you need assistance with bathing/showering or dressing?: No Do you need assistance with meal preparation?: No Do you need assistance with eating?: No Do you have difficulty maintaining continence: No Do you need assistance with getting out of bed/getting out of a chair/moving?: No Do you have difficulty managing or taking your medications?: No  Follow up appointments reviewed: PCP Follow-up appointment confirmed?: Yes  Date of PCP follow-up appointment?: 10/28/23 Follow-up Provider: [facility] Follow-up appointment confirmed?: No Reason Specialist Follow-Up Not Confirmed: Patient has Specialist Provider Number and will Call for Appointment Do you  need transportation to your follow-up appointment?: No Do you understand care options if your condition(s) worsen?: Yes-patient verbalized understanding    SIGNATURE Julian Lemmings, LPN [facility] Nurse Health Advisor Direct Dial [phone]

## 2023-10-22 ENCOUNTER — Other Ambulatory Visit: Payer: Self-pay | Admitting: Internal Medicine

## 2023-10-24 DIAGNOSIS — F4381 Prolonged grief disorder: Secondary | ICD-10-CM | POA: Diagnosis not present

## 2023-10-28 ENCOUNTER — Ambulatory Visit: Admitting: Internal Medicine

## 2023-10-28 ENCOUNTER — Encounter: Payer: Self-pay | Admitting: Internal Medicine

## 2023-10-28 VITALS — BP 124/80 | HR 63 | Temp 97.3°F | Ht 62.0 in | Wt 123.0 lb

## 2023-10-28 DIAGNOSIS — M5416 Radiculopathy, lumbar region: Secondary | ICD-10-CM

## 2023-10-28 DIAGNOSIS — Z23 Encounter for immunization: Secondary | ICD-10-CM

## 2023-10-28 DIAGNOSIS — F331 Major depressive disorder, recurrent, moderate: Secondary | ICD-10-CM | POA: Diagnosis not present

## 2023-10-28 NOTE — Progress Notes (Signed)
 Subjective:   Patient ID: Anna Lyons, female    DOB: 1955/03/20, 68 y.o.   MRN: [id number]  Discussed the use of AI scribe software for clinical note transcription with the patient, who gave verbal consent to proceed.  History of Present Illness Anna Lyons is a 68 year old female with spine disease who presents with worsening back pain and balance issues. She was recently admitted to the hospital overnight for observation at concern of worsening spinal stenosis. Repeat MRI was stable from 2021.   She has a history of spine disease and was recently hospitalized for evaluation of her back pain and balance issues. MRI scans of her back and brain were performed and compared to previous scans from 2021, showing no significant changes. Despite this, she continues to experience symptoms affecting her daily life.  The MRI revealed medium arthritis at L1 and L2, as well as at L4 and L5, with a bulging disc and narrowing of the spinal cord area at the lowest part of her back. She reports difficulty with balance, stating 'I fell this morning' and struggles with sitting down quickly or finding a place to sit. She uses a mobility aid to help with steadiness.  She reports a lack of motivation and energy, spending most of her time at home watching TV and going to bed, which she attributes to her depression. She is seeing two psychologists: one for medication management and another for therapy, but feels her depression is not improving.  She also reports oral symptoms, including 'smacking her lips' and difficulty swallowing, particularly at night, which she attributes to dry mouth. She experiences blisters on her tongue and throat pain, which she has discussed with her dentist without resolution. She has tried artificial saliva products, but finds them ineffective.  Recent blood work, including blood counts, kidney function, vitamin levels, and thyroid  levels, were all within normal limits. PMH, [facility], social  history reviewed and updated  Medication reconciliation done during visit and updated as appropriate.   Review of Systems  Constitutional:  Positive for activity change.  HENT: Negative.         Dry mouth  Eyes: Negative.   Respiratory:  Negative for cough, chest tightness and shortness of breath.   Cardiovascular:  Negative for chest pain, palpitations and leg swelling.  Gastrointestinal:  Negative for abdominal distention, abdominal pain, constipation, diarrhea, nausea and vomiting.  Musculoskeletal:  Positive for arthralgias, back pain and gait problem.  Skin: Negative.   Neurological:  Positive for weakness.  Psychiatric/Behavioral:  Positive for decreased concentration, dysphoric mood and sleep disturbance.     Objective:  Physical Exam Constitutional:      Appearance: She is well-developed.  HENT:     Head: Normocephalic and atraumatic.  Cardiovascular:     Rate and Rhythm: Normal rate and regular rhythm.  Pulmonary:     Effort: Pulmonary effort is normal. No respiratory distress.     Breath sounds: Normal breath sounds. No wheezing or rales.  Abdominal:     General: Bowel sounds are normal. There is no distension.     Palpations: Abdomen is soft.     Tenderness: There is no abdominal tenderness.  Musculoskeletal:        General: Tenderness present.     Cervical back: Normal range of motion.  Skin:    General: Skin is warm and dry.  Neurological:     Mental Status: She is alert and oriented to person, place, and time.  Coordination: Coordination abnormal.     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 MRI results remain unchanged since 2021. Refer her to physical therapy for strengthening and balance exercises at a facility. A referral to a back specialist is necessary for further evaluation and management.  Depression   She experiences chronic depression with persistent symptoms despite current mental health care. Increasing physical activity may aid in mood improvement. She will continue seeing psych for management as she has tried and failed many outpatient regimens.  Oral mucosal irritation and dryness   Oral mucosal irritation and dryness worsen at night, affecting sleep. Recommend citrus-flavored lozenges to stimulate saliva and suggest using artificial saliva products like Biotene, especially before bedtime.

## 2023-10-28 NOTE — Patient Instructions (Signed)
 We will get you in with the back specialist and for some physical therapy.

## 2023-10-28 NOTE — Assessment & Plan Note (Signed)
 She experiences chronic depression with persistent symptoms despite current mental health care. Increasing physical activity may aid in mood improvement. She will continue seeing psych for management as she has tried and failed many outpatient regimens

## 2023-10-28 NOTE — Assessment & Plan Note (Signed)
 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 MRI results remain unchanged since 2021. Refer her to physical therapy for strengthening and balance exercises at a facility. A referral to a back specialist is necessary for further evaluation and management.

## 2023-10-31 DIAGNOSIS — F4381 Prolonged grief disorder: Secondary | ICD-10-CM | POA: Diagnosis not present

## 2023-11-01 ENCOUNTER — Other Ambulatory Visit: Payer: Self-pay | Admitting: Internal Medicine

## 2023-11-01 DIAGNOSIS — F331 Major depressive disorder, recurrent, moderate: Secondary | ICD-10-CM

## 2023-11-02 ENCOUNTER — Encounter: Payer: Self-pay | Admitting: Internal Medicine

## 2023-11-02 ENCOUNTER — Telehealth: Payer: Self-pay

## 2023-11-02 DIAGNOSIS — Z6823 Body mass index (BMI) 23.0-23.9, adult: Secondary | ICD-10-CM | POA: Diagnosis not present

## 2023-11-02 DIAGNOSIS — Z1151 Encounter for screening for human papillomavirus (HPV): Secondary | ICD-10-CM | POA: Diagnosis not present

## 2023-11-02 DIAGNOSIS — Z01419 Encounter for gynecological examination (general) (routine) without abnormal findings: Secondary | ICD-10-CM | POA: Diagnosis not present

## 2023-11-02 DIAGNOSIS — Z124 Encounter for screening for malignant neoplasm of cervix: Secondary | ICD-10-CM | POA: Diagnosis not present

## 2023-11-02 DIAGNOSIS — Z1231 Encounter for screening mammogram for malignant neoplasm of breast: Secondary | ICD-10-CM | POA: Diagnosis not present

## 2023-11-02 NOTE — Telephone Encounter (Unsigned)
 Copied from CRM #[id number]. Topic: General - Other >> Nov 02, 2023  1:43 PM Macario HERO wrote: Reason for CRM: Patient called said she is requesting a letter a from Dr. Rollene stating that she is good to go back to work. She stated she needs the letter today, so she can go back to work tomorrow. She is requesting a call back from the clinic.

## 2023-11-02 NOTE — Telephone Encounter (Signed)
 She is going to have to wait until Dr. Rollene gets here.

## 2023-11-02 NOTE — Telephone Encounter (Signed)
**Note De-identified  [place] Obfuscation** Please advise 

## 2023-11-03 ENCOUNTER — Encounter: Payer: Self-pay | Admitting: Internal Medicine

## 2023-11-03 ENCOUNTER — Telehealth: Payer: Self-pay

## 2023-11-03 NOTE — Telephone Encounter (Signed)
 Copied from CRM [id number]. Topic: Clinical - Lab/Test Results >> Nov 03, 2023 11:57 AM Charolett L wrote: Reason for RMF:[facility] called to verify if results of the for an Cologuard was sent  Results needs to be faxed to [phone]

## 2023-11-03 NOTE — Telephone Encounter (Signed)
 I have sent in work letter on behalf of patient she was just needing a work note From missing work 9/26

## 2023-11-03 NOTE — Telephone Encounter (Signed)
 It appears they have a cologuard and are trying to fax us  the results. Can you check onbase and media tab for results from [facility]?

## 2023-11-03 NOTE — Telephone Encounter (Signed)
 I have looked in the media tab and unable to find the test but I have sent a message out to our front desk to check onbase to see if we have received this

## 2023-11-03 NOTE — Telephone Encounter (Signed)
 I do not see a cologuard in patient chart, I have checked las and care everywhere

## 2023-11-04 ENCOUNTER — Ambulatory Visit: Admitting: Behavioral Health

## 2023-11-04 NOTE — Telephone Encounter (Signed)
 Copied from CRM #[id number]. Topic: General - Other >> Nov 04, 2023 10:23 AM Thersia BROCKS wrote: Reason for CRM: [facility] called wanting to speak with Lex would like for her to give her a callback, stated on  september 9th of 2024, expired and do not have record of the test being done and no results [id number]

## 2023-11-04 NOTE — Telephone Encounter (Signed)
 I have called and was able to leave a voicemail for Anna Lyons in regards to this

## 2023-11-07 DIAGNOSIS — F4381 Prolonged grief disorder: Secondary | ICD-10-CM | POA: Diagnosis not present

## 2023-11-08 ENCOUNTER — Other Ambulatory Visit: Payer: Self-pay | Admitting: Internal Medicine

## 2023-11-08 NOTE — Telephone Encounter (Signed)
 Copied from CRM [id number]. Topic: Clinical - Medication Refill >> Nov 08, 2023  9:58 AM Thersia C wrote: Medication: temazepam  (RESTORIL ) 15 MG capsule  Has the patient contacted their pharmacy? Yes (Agent: If no, request that the patient contact the pharmacy for the refill. If patient does not wish to contact the pharmacy document the reason why and proceed with request.) (Agent: If yes, when and what did the pharmacy advise?)  This is the patient's preferred pharmacy:  [facility] [address], [place] - [address] [place] [address] Phone: [phone] Fax: [phone]    Is this the correct pharmacy for this prescription? Yes If no, delete pharmacy and type the correct one.   Has the prescription been filled recently? No  Is the patient out of the medication? Yes  Has the patient been seen for an appointment in the last year OR does the patient have an upcoming appointment? Yes  Can we respond through MyChart? Yes  Agent: Please be advised that Rx refills may take up to 3 business days. We ask that you follow-up with your pharmacy.

## 2023-11-09 NOTE — Telephone Encounter (Signed)
 Do we need to place another order for cologaurd?

## 2023-11-14 DIAGNOSIS — F4381 Prolonged grief disorder: Secondary | ICD-10-CM | POA: Diagnosis not present

## 2023-11-14 NOTE — Telephone Encounter (Signed)
 We do not have that she is due for colon screening until 2027 so I would not order this.

## 2023-11-16 NOTE — Telephone Encounter (Signed)
 Called them back and informed them that Dr Rollene will not be ordering due to this not being due yet

## 2023-11-21 DIAGNOSIS — F4381 Prolonged grief disorder: Secondary | ICD-10-CM | POA: Diagnosis not present

## 2023-11-23 ENCOUNTER — Ambulatory Visit: Attending: Internal Medicine

## 2023-11-23 DIAGNOSIS — M6281 Muscle weakness (generalized): Secondary | ICD-10-CM | POA: Diagnosis not present

## 2023-11-23 DIAGNOSIS — R2689 Other abnormalities of gait and mobility: Secondary | ICD-10-CM | POA: Diagnosis not present

## 2023-11-23 DIAGNOSIS — Z7409 Other reduced mobility: Secondary | ICD-10-CM | POA: Diagnosis not present

## 2023-11-23 DIAGNOSIS — I69351 Hemiplegia and hemiparesis following cerebral infarction affecting right dominant side: Secondary | ICD-10-CM | POA: Insufficient documentation

## 2023-11-23 DIAGNOSIS — M5416 Radiculopathy, lumbar region: Secondary | ICD-10-CM | POA: Insufficient documentation

## 2023-11-23 DIAGNOSIS — R296 Repeated falls: Secondary | ICD-10-CM | POA: Insufficient documentation

## 2023-11-23 DIAGNOSIS — R531 Weakness: Secondary | ICD-10-CM | POA: Insufficient documentation

## 2023-11-23 DIAGNOSIS — R2681 Unsteadiness on feet: Secondary | ICD-10-CM | POA: Insufficient documentation

## 2023-11-23 NOTE — Therapy (Signed)
 OUTPATIENT PHYSICAL THERAPY THORACOLUMBAR EVALUATION   Patient Name: Anna Lyons MRN: [id number] DOB:19-Jun-1955, 68 y.o., female Today's Date: 11/23/2023  END OF SESSION:  PT End of Session - 11/23/23 1307     Visit Number 1    Date for Recertification  02/15/24    Authorization Type HTA    PT Start Time 1310    PT Stop Time 1355    PT Time Calculation (min) 45 min          Past Medical History:  Diagnosis Date   Angina    Breast cyst    Cancer (HCC)    Depression    Hypertension    RLS (restless legs syndrome) 11/16/2011   Seizures (HCC) 2016   last seizure=last year, 2016 per pt.   Stroke (HCC) 04/20/2011   a. 04/20/11 Left subcortical infarct treated w/ TPA;  b. 04/2011 Echo: EF 55-60%;  c. 04/2011 Normal Carotid u/s  d. Residual walk w/limp on right; unable to grasp w/right hand   TIA (transient ischemic attack) 08/2010   Tobacco abuse    a. quit @ time of CVA 04/2011.   Past Surgical History:  Procedure Laterality Date   BREAST RECONSTRUCTION     bilaterally   CARPAL TUNNEL RELEASE Left 1990's   CARPAL TUNNEL RELEASE Left 12/08/2015   CESAREAN SECTION  1979   KNEE ARTHROSCOPY Left 1990's    cartilage repair   LEFT HEART CATHETERIZATION WITH CORONARY ANGIOGRAM N/A 05/20/2011   Procedure: LEFT HEART CATHETERIZATION WITH CORONARY ANGIOGRAM;  Surgeon: Maude JAYSON Emmer, MD;  Location: [facility] CATH LAB;  Service: Cardiovascular;  Laterality: N/A;   MASTECTOMY Bilateral 1980's   bilateral with reconstruction, breast cancer on left   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   Claudication 02/28/2019   PTSD (post-traumatic stress disorder) 04/01/2017   Cough 03/04/2016   Routine general medical  examination at a health care facility 11/15/2014   Hemiparesis affecting right side as late effect of cerebrovascular accident (HCC) 06/18/2013   Seizure disorder (HCC) 05/19/2012   Hypokalemia 11/16/2011   RLS (restless legs syndrome) 11/16/2011   Essential hypertension, benign 05/19/2011   Tobacco abuse 05/19/2011   Moderate episode of recurrent major depressive disorder (HCC)     PCP: Almarie Cleveland  REFERRING PROVIDER: Niki Rung  REFERRING DIAG:  M54.16 (ICD-10-CM) - Lumbar radiculopathy    Rationale for Evaluation and Treatment: Rehabilitation  THERAPY DIAG:  Repeated falls  Other abnormalities of gait and mobility  Impaired functional mobility, balance, gait, and endurance  Muscle weakness (generalized)  ONSET DATE: started this year  SUBJECTIVE:  SUBJECTIVE STATEMENT: I am doing. I keep falling down. The doctor said I had a stroke back in 2014, and that I have arthritis in my back and the nerves are making my legs numb and I fall.   PERTINENT HISTORY:  Hx of prior stroke ' 13 (L lacunar; treated with tPA), Lumbar spondylosis with associated neuroforaminal stenosis, + spinal stenosis.; was to be referred to neurosurgery in '21 although unclear if seen; appears previously she had intact strength in the R leg when seen by neurology in '21; additional hx including seizure disorder, mild cognitive impairment, hx breast cancer, HTN, RLS, mood d/o, who presented due to more frequent falls and transferred from [facility] ED for stroke evaluation.    Per patients report she has very frequent falls, estimates about 10 per month, and attributes to feeling of imbalance, sometimes lightheadedness, and right leg weakness. Describes that her R leg has been dragging, onset unclear per patient; from ED  record daughter had reported this happening over past 2-3 weeks, and worse balance from baseline. recently falls were happening back to back, had 2 falls in 3 days, and sought care and was referred to the ED for evaluation.    She has hit her head during falls, she denies any specific sites of injuries. Has headaches. No vision changes, speech changes, focal numbness; does have weakness most in the R leg. And balance issues as mentioned. Otherwise denies recent illness.  PAIN:  Are you having pain? No  PRECAUTIONS: Fall  RED FLAGS: None   WEIGHT BEARING RESTRICTIONS: No  FALLS:  Has patient fallen in last 6 months? Yes. 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  DIAGNOSTIC FINDINGS:  Hx chronic frequent falls, with recent change over past few weeks with worse RLE weakness, + imbalance. CT Head negative for acute, old strokes seen. CT C spine negative for acute, C5-6 degenerative changes. And CT L spine with Acute R L2 transverse process fracture, and progressive spondylosis with mod-severe spinal stenosis at L4-5 and moderate at L2-3. Ultimately MRI is negative for acute stroke, she has old L cerebellar, and bilateral basal ganglia infarcts noted.    COGNITION: Overall cognitive status: Within functional limits for tasks assessed     SENSATION: Light touch: Impaired    POSTURE: rounded shoulders and forward head   LOWER EXTREMITY ROM:   grossly WFL, some slight limitation  in R ankle DF/PF and pain with TKE on R side   LOWER EXTREMITY MMT:    MMT Right eval Left eval  Hip flexion 3+ 4-  Hip extension    Hip abduction 3+ 3+  Hip adduction    Hip internal rotation    Hip external rotation    Knee flexion 3+ 4-  Knee extension 4- 4-  Ankle dorsiflexion    Ankle plantarflexion    Ankle inversion    Ankle eversion     (Blank rows = not tested)  FUNCTIONAL TESTS:  5 times sit to stand:  43s with UE push off Timed up and go (TUG): 51s BERG 23/56  GAIT: Distance walked: in clinic distances Assistive device utilized: Quad cane large base Level of assistance: CGA and Min A Comments: unsteady especially with turns, does not use with proper mechanics, some decreased safety awareness   TREATMENT DATE: 11/23/23- EVAL                                                                                                                                 PATIENT EDUCATION:  Education details: POC, HEP, fall risk and how to decrease risk Person educated: Patient Education method: Medical illustrator Education comprehension: verbalized understanding and returned demonstration  HOME EXERCISE PROGRAM: Access Code: [email or web address] URL: [email or web address] Date: 11/23/2023 Prepared by: Almetta Fam  Exercises - Sit to Stand with Armchair  - 1 x daily - 7 x weekly - 2 sets - 10 reps - Seated Long Arc Quad  - 1 x daily - 7 x weekly - 2 sets - 10 reps - Seated Hip Abduction with Resistance  - 1 x daily - 7 x weekly - 2 sets - 10 reps - Seated March with Resistance  - 1 x daily - 7 x weekly - 2 sets - 10 reps - Seated Hip Adduction Isometrics with Ball  - 1 x daily - 7 x weekly - 2 sets - 10 reps  ASSESSMENT:  CLINICAL IMPRESSION: Patient is a 68 y.o. female who was seen today for physical therapy evaluation and treatment for weakness, balance, and repeated falls. She also had a referral for lumbar radiculopathy but denies pain in her back. She  ambulates with a large base quad cane but is unsteady. Patient reports at least 5 falls just this month and too many to count within the last 6 months. With functional testing she presents as a high fall risk. She does have weakness in BLE as well. Did not give her balance exercises to work on at home due to how unsteady she is and because she lives alone it is not safe for her to try by herself. Patient will benefit from PT to improve her strength and stability to decrease her risk for falls.   OBJECTIVE IMPAIRMENTS: Abnormal gait, decreased activity tolerance, decreased balance, decreased coordination, difficulty walking, decreased strength, decreased safety awareness, and dizziness.   ACTIVITY LIMITATIONS: carrying, lifting, bending, squatting, stairs, transfers, dressing, and locomotion level  PARTICIPATION LIMITATIONS: cleaning, laundry, shopping, community activity, occupation, and yard work  PERSONAL FACTORS: Age, Past/current experiences, Time since onset of injury/illness/exacerbation, and 1-2 comorbidities: hx of stroke, DDD, HTN  are also affecting patient's functional outcome.   REHAB POTENTIAL: Fair    CLINICAL DECISION MAKING: Evolving/moderate complexity  EVALUATION COMPLEXITY: Moderate   GOALS: Goals reviewed with patient? Yes  SHORT TERM GOALS: Target date: 01/04/24  Patient will be independent with initial HEP. Baseline:  Goal status: INITIAL  2.  Patient will demonstrate decreased fall risk  by scoring < 35 sec on TUG. Baseline: 51s Goal status: INITIAL  3.  Patient will be educated on strategies to decrease risk of falls.  Baseline:  Goal status: INITIAL   LONG TERM GOALS: Target date: 02/15/24  Patient will be independent with advanced/ongoing HEP to improve outcomes and carryover.  Baseline:  Goal status: INITIAL  2.  Patient will be able to ambulate 300' with LRAD with good safety to access community.  Baseline:  Goal status: INITIAL  3.  Patient will  demonstrate decreased fall risk by scoring < 25 sec on TUG. Baseline: 51s Goal status: INITIAL  4.  Patient will demonstrate improved functional LE strength as demonstrated by 5xSTS <25s. Baseline: 43s with UE push off Goal status: INITIAL  5.  Patient will report no falls in a 4 week period.  Baseline: 5 this month Goal status: INITIAL  6.  Patient will score at least 30 or better on Berg Balance test to demonstrate lower risk of falls. Baseline: 23 Goal status: INITIAL  PLAN:  PT FREQUENCY: 2x/week  PT DURATION: 12 weeks  PLANNED INTERVENTIONS: 97110-Therapeutic exercises, 97530- Therapeutic activity, 97112- Neuromuscular re-education, 97535- Self Care, 02859- Manual therapy, [phone]- Gait training, Patient/Family education, Balance training, Stair training, Taping, Joint mobilization, Spinal mobilization, Cryotherapy, and Moist heat.  PLAN FOR NEXT SESSION: LE strengthening, balance training- stay close as she is not very steady on her feet    Almetta Fam, PT 11/23/2023, 1:56 PM

## 2023-12-05 DIAGNOSIS — F4381 Prolonged grief disorder: Secondary | ICD-10-CM | POA: Diagnosis not present

## 2023-12-12 ENCOUNTER — Ambulatory Visit (INDEPENDENT_AMBULATORY_CARE_PROVIDER_SITE_OTHER): Admitting: Behavioral Health

## 2023-12-12 ENCOUNTER — Encounter: Payer: Self-pay | Admitting: Behavioral Health

## 2023-12-12 DIAGNOSIS — F411 Generalized anxiety disorder: Secondary | ICD-10-CM | POA: Diagnosis not present

## 2023-12-12 DIAGNOSIS — F332 Major depressive disorder, recurrent severe without psychotic features: Secondary | ICD-10-CM | POA: Diagnosis not present

## 2023-12-12 DIAGNOSIS — F331 Major depressive disorder, recurrent, moderate: Secondary | ICD-10-CM | POA: Diagnosis not present

## 2023-12-12 DIAGNOSIS — F4310 Post-traumatic stress disorder, unspecified: Secondary | ICD-10-CM | POA: Diagnosis not present

## 2023-12-12 DIAGNOSIS — F4381 Prolonged grief disorder: Secondary | ICD-10-CM | POA: Diagnosis not present

## 2023-12-12 MED ORDER — CARIPRAZINE HCL 1.5 MG PO CAPS: 1.5000 mg | ORAL_CAPSULE | Freq: Every day | ORAL | 1 refills | Status: AC

## 2023-12-12 NOTE — Progress Notes (Signed)
 [facility]  Patient ID: Anna Lyons,  MRN: [id number]  PCP: Rollene Almarie LABOR, MD  Date of Evaluation: 12/12/2023 Time spent:30 minutes  Chief Complaint:  Chief Complaint   Anxiety; Depression; Follow-up; Patient Education; Medication Refill     HISTORY/CURRENT STATUS: HPI Anna Lyons, 68 year old female presents to this office for follow up and medication management. Collateral information should be considered reliable.  She is calm and collected today but ambulating more slowly.  She is using a 4 point cane to assist.  Says that she was in the hospital on September 16 for frequent falls.  Says that they suspected she may have had a stroke however imaging was clear.  She  reports  continued depression today at 7/10, and anxiety at 3/10.  She is sleeping well with the aid of temazepam  15 mg at bedtime.  She also says that she is sleeping too much taking multiple naps per day.  She wonders if it has anything to do with some of her medications.  She denies history of mania, no psychosis, no auditory or visual hallucinations.  No delirium.  No current SI or HI.  Verbally contracts for safety and says that she would never harm self. Strong family support.   Past psychiatric medication trials:   Zoloft  Duloxetine  Temazepam  Aripiprazole  [name]       Individual Medical History/ Review of Systems: Changes? :No   Allergies: Codeine and Oxycodone -acetaminophen   Current Medications:  Current Outpatient Medications:    cariprazine (VRAYLAR) 1.5 MG capsule, Take 1 capsule (1.5 mg total) by mouth daily., Disp: 30 capsule, Rfl: 1   aspirin  EC 81 MG tablet, Take 81 mg by mouth daily., Disp: , Rfl:    atorvastatin  (LIPITOR ) 80 MG tablet, TAKE 1 TABLET BY MOUTH AT BEDTIME, Disp: 90 tablet, Rfl: 1   buPROPion  (WELLBUTRIN  XL) 300 MG 24 hr tablet, Take 1 tablet (300 mg total) by mouth daily., Disp: 30 tablet, Rfl: 2   cholecalciferol (VITAMIN D3) 25 MCG (1000 UNIT) tablet, Take  1,000 Units by mouth daily., Disp: , Rfl:    escitalopram  (LEXAPRO ) 20 MG tablet, Take 1 tablet (20 mg total) by mouth daily., Disp: 30 tablet, Rfl: 2   ferrous sulfate  324 MG TBEC, Take 324 mg by mouth., Disp: , Rfl:    gabapentin  (NEURONTIN ) 300 MG capsule, Take 1 capsule (300 mg total) by mouth 2 (two) times daily. (Patient taking differently: Take 300 mg by mouth daily.), Disp: 180 capsule, Rfl: 3   levETIRAcetam  (KEPPRA ) 500 MG tablet, Take 1 tablet (500 mg total) by mouth 2 (two) times daily., Disp: 180 tablet, Rfl: 3   Magnesium  250 MG TABS, Take by mouth., Disp: , Rfl:    Multiple Vitamin (MULTIVITAMIN WITH MINERALS) TABS tablet, Take 1 tablet by mouth daily., Disp: , Rfl:    pramipexole  (MIRAPEX ) 0.5 MG tablet, TAKE 1 TABLET BY MOUTH AT BEDTIME AS NEEDED, Disp: 90 tablet, Rfl: 0   temazepam  (RESTORIL ) 15 MG capsule, Take 1 capsule by mouth at bedtime, Disp: 90 capsule, Rfl: 0   vitamin B-12 (CYANOCOBALAMIN ) 1000 MCG tablet, Take 1,000 mcg by mouth daily., Disp: , Rfl:  Medication Side Effects: none  Family Medical/ Social History: Changes? No  MENTAL HEALTH EXAM:  There were no vitals taken for this visit.There is no height or weight on file to calculate BMI.  General Appearance: Casual, Neat, and Well Groomed  Eye Contact:  Fair  Speech:  Clear and Coherent  Volume:  Normal  Mood:  Anxious, Depressed,  and Dysphoric  Affect:  Depressed, Flat, and Anxious  Thought Process:  Coherent  Orientation:  Full (Time, Place, and Person)  Thought Content: Logical   Suicidal Thoughts:  No  Homicidal Thoughts:  No  Memory:  WNL  Judgement:  Good  Insight:  Good  Psychomotor Activity:  Normal  Concentration:  Concentration: Good  Recall:  Good  Fund of Knowledge: Good  Language: Good  Assets:  Desire for Improvement  ADL's:  Intact  Cognition: WNL  Prognosis:  Good    DIAGNOSES:    ICD-10-CM   1. Generalized anxiety disorder  F41.1 cariprazine (VRAYLAR) 1.5 MG capsule    2.  Severe episode of recurrent major depressive disorder, without psychotic features (HCC)  F33.2 cariprazine (VRAYLAR) 1.5 MG capsule    3. PTSD (post-traumatic stress disorder)  F43.10 cariprazine (VRAYLAR) 1.5 MG capsule    4. Moderate episode of recurrent major depressive disorder (HCC)  F33.1       Receiving Psychotherapy: No    RECOMMENDATIONS:   Greater than 50% of 30 min  face to face time with patient was spent on counseling and coordination of care.  Discussed her current level of stability today.  She is still very flat.  Noticing some abnormal mouth movements with lips and tongue today.  Would suspect TD if she was currently on antipsychotics.  She reports some problems with her upper plate and dental work or the cause.  Advised her that we will need to monitor her and she agrees to follow-up with her dentist as soon as possible.  Continues to complain of extreme fatigue and daytime somnolence however she is currently on gabapentin , Keppra , and pramipexole .  These medications could be contributing to feeling sleepy all the time. We reviewed her medications and discussed other medication options.  She was requesting a trial of Caplyta but I am concerned about the cost and noncoverage with Medicare.  She agreed to a trial of Vraylar 1.5 mg daily.  I did explain to her that any of these medications could increase the risk of stroke.  She acknowledged understanding and said that she could not continue feeling as bad as she does.   We agreed today to: Will start Vraylar 1.5 mg daily in the a.m. after breakfast Continue Lexapro  20  mg daily after breakfast To continue Temazapam 15 mg at bedtime for sleep Continue Gabapentin  to 300 mg daily  Continue Wellbutrin  to 300 mg XR in the am after breakfast. To follow-up in 4 weeks to reassess Will report worsening symptoms or side effects promptly Provided emergency contact information number Discussed potential metabolic side effects associated  with atypical antipsychotics, as well as potential risk for movement side effects. Advised pt to contact office if movement side effects occur.   Reviewed PDMP     [name], NP

## 2023-12-14 ENCOUNTER — Encounter: Payer: Self-pay | Admitting: Physical Therapy

## 2023-12-14 ENCOUNTER — Ambulatory Visit: Attending: Internal Medicine | Admitting: Physical Therapy

## 2023-12-14 DIAGNOSIS — R296 Repeated falls: Secondary | ICD-10-CM | POA: Diagnosis not present

## 2023-12-14 DIAGNOSIS — Z7409 Other reduced mobility: Secondary | ICD-10-CM | POA: Diagnosis not present

## 2023-12-14 DIAGNOSIS — M6281 Muscle weakness (generalized): Secondary | ICD-10-CM | POA: Diagnosis not present

## 2023-12-14 DIAGNOSIS — R2689 Other abnormalities of gait and mobility: Secondary | ICD-10-CM | POA: Insufficient documentation

## 2023-12-14 NOTE — Therapy (Signed)
 OUTPATIENT PHYSICAL THERAPY THORACOLUMBAR TREATMENT   Patient Name: ONETA SIGMAN MRN: [id number] DOB:[birth year], 68 y.o., female Today's Date: 12/14/2023  END OF SESSION:  PT End of Session - 12/14/23 1340     Visit Number 2    Date for Recertification  02/15/24    PT Start Time 1345    PT Stop Time 1430    PT Time Calculation (min) 45 min    Activity Tolerance Patient tolerated treatment well          Past Medical History:  Diagnosis Date   Angina    Breast cyst    Cancer (HCC)    Depression    Hypertension    RLS (restless legs syndrome) 11/16/2011   Seizures (HCC) 2016   last seizure=last year, 2016 per pt.   Stroke (HCC) 04/20/2011   a. 04/20/11 Left subcortical infarct treated w/ TPA;  b. 04/2011 Echo: EF 55-60%;  c. 04/2011 Normal Carotid u/s  d. Residual walk w/limp on right; unable to grasp w/right hand   TIA (transient ischemic attack) 08/2010   Tobacco abuse    a. quit @ time of CVA 04/2011.   Past Surgical History:  Procedure Laterality Date   BREAST RECONSTRUCTION     bilaterally   CARPAL TUNNEL RELEASE Left 1990's   CARPAL TUNNEL RELEASE Left 12/08/2015   CESAREAN SECTION  1979   KNEE ARTHROSCOPY Left 1990's    cartilage repair   LEFT HEART CATHETERIZATION WITH CORONARY ANGIOGRAM N/A 05/20/2011   Procedure: LEFT HEART CATHETERIZATION WITH CORONARY ANGIOGRAM;  Surgeon: Maude JAYSON Emmer, MD;  Location: [facility] CATH LAB;  Service: Cardiovascular;  Laterality: N/A;   MASTECTOMY Bilateral 1980's   bilateral with reconstruction, breast cancer on left   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   Claudication 02/28/2019   PTSD (post-traumatic stress disorder) 04/01/2017   Cough  03/04/2016   Routine general medical examination at a health care facility 11/15/2014   Hemiparesis affecting right side as late effect of cerebrovascular accident (HCC) 06/18/2013   Seizure disorder (HCC) 05/19/2012   Hypokalemia 11/16/2011   RLS (restless legs syndrome) 11/16/2011   Essential hypertension, benign 05/19/2011   Tobacco abuse 05/19/2011   Moderate episode of recurrent major depressive disorder (HCC)     PCP: Almarie Cleveland  REFERRING PROVIDER: Niki Rung  REFERRING DIAG:  M54.16 (ICD-10-CM) - Lumbar radiculopathy    Rationale for Evaluation and Treatment: Rehabilitation  THERAPY DIAG:  Repeated falls  Other abnormalities of gait and mobility  Impaired functional mobility, balance, gait, and endurance  Muscle weakness (generalized)  ONSET DATE: started this year  SUBJECTIVE:  SUBJECTIVE STATEMENT:  12/14/23  I am doing ok, last fall was a week and a half ago. Will get lightheaded and dizzy when she stands a long time  I am doing. I keep falling down. The doctor said I had a stroke back in 2014, and that I have arthritis in my back and the nerves are making my legs numb and I fall.   PERTINENT HISTORY:  Hx of prior stroke ' 13 (L lacunar; treated with tPA), Lumbar spondylosis with associated neuroforaminal stenosis, + spinal stenosis.; was to be referred to neurosurgery in '21 although unclear if seen; appears previously she had intact strength in the R leg when seen by neurology in '21; additional hx including seizure disorder, mild cognitive impairment, hx breast cancer, HTN, RLS, mood d/o, who presented due to more frequent falls and transferred from [facility] ED for stroke evaluation.    Per patients report she has very frequent falls, estimates about 10 per month, and  attributes to feeling of imbalance, sometimes lightheadedness, and right leg weakness. Describes that her R leg has been dragging, onset unclear per patient; from ED record daughter had reported this happening over past 2-3 weeks, and worse balance from baseline. recently falls were happening back to back, had 2 falls in 3 days, and sought care and was referred to the ED for evaluation.    She has hit her head during falls, she denies any specific sites of injuries. Has headaches. No vision changes, speech changes, focal numbness; does have weakness most in the R leg. And balance issues as mentioned. Otherwise denies recent illness.  PAIN:  Are you having pain? No  PRECAUTIONS: Fall  RED FLAGS: None   WEIGHT BEARING RESTRICTIONS: No  FALLS:  Has patient fallen in last 6 months? Yes. 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  DIAGNOSTIC FINDINGS:  Hx chronic frequent falls, with recent change over past few weeks with worse RLE weakness, + imbalance. CT Head negative for acute, old strokes seen. CT C spine negative for acute, C5-6 degenerative changes. And CT L spine with Acute R L2 transverse process fracture, and progressive spondylosis with mod-severe spinal stenosis at L4-5 and moderate at L2-3. Ultimately MRI is negative for acute stroke, she has old L cerebellar, and bilateral basal ganglia infarcts noted.    COGNITION: Overall cognitive status: Within functional limits for  tasks assessed     SENSATION: Light touch: Impaired    POSTURE: rounded shoulders and forward head   LOWER EXTREMITY ROM:   grossly WFL, some slight limitation in R ankle DF/PF and pain with TKE on R side   LOWER EXTREMITY MMT:    MMT Right eval Left eval  Hip flexion 3+ 4-  Hip extension    Hip abduction 3+ 3+  Hip adduction    Hip internal rotation    Hip external rotation    Knee flexion 3+ 4-  Knee extension 4- 4-  Ankle dorsiflexion    Ankle plantarflexion  Ankle inversion    Ankle eversion     (Blank rows = not tested)  FUNCTIONAL TESTS:  5 times sit to stand: 43s with UE push off Timed up and go (TUG): 51s BERG 23/56  GAIT: Distance walked: in clinic distances Assistive device utilized: Quad cane large base Level of assistance: CGA and Min A Comments: unsteady especially with turns, does not use with proper mechanics, some decreased safety awareness   TREATMENT DATE:  12/14/23 NuStep L2 x 4 min STM to UT and cervical spine  Sit to stands x 5 from elevated mat, UE assist needed, LE does push againt table HS curls yellow 2x5 each LAQ no weight 2x10 each Ankle pumps x 10, x5 Standing march x10 HHA x2   11/23/23- EVAL                                                                                                                                 PATIENT EDUCATION:  Education details: POC, HEP, fall risk and how to decrease risk Person educated: Patient Education method: Medical Illustrator Education comprehension: verbalized understanding and returned demonstration  HOME EXERCISE PROGRAM: Access Code: [email or web address] URL: [email or web address] Date: 11/23/2023 Prepared by: Almetta Fam  Exercises - Sit to Stand with Armchair  - 1 x daily - 7 x weekly - 2 sets - 10 reps - Seated Long Arc Quad  - 1 x daily - 7 x weekly - 2 sets - 10 reps - Seated Hip Abduction with Resistance  - 1 x daily - 7 x weekly - 2 sets - 10 reps - Seated  March with Resistance  - 1 x daily - 7 x weekly - 2 sets - 10 reps - Seated Hip Adduction Isometrics with Ball  - 1 x daily - 7 x weekly - 2 sets - 10 reps  ASSESSMENT:  CLINICAL IMPRESSION: Patient is a 68 y.o. female who was seen today for physical therapy treatment for weakness, balance, and repeated falls. She ambulates with a large base quad cane but is unsteady.  She does have weakness in BLE and this was evident throughout session. Unable to complete sit to stands without compensations. Very slow right movements with RLE doing interventions.  Pt fatigues quick with activity unable to complete a full therapy session. Patient will benefit from PT to improve her strength and stability to decrease her risk for falls.   OBJECTIVE IMPAIRMENTS: Abnormal gait, decreased activity tolerance, decreased balance, decreased coordination, difficulty walking, decreased strength, decreased safety awareness, and dizziness.   ACTIVITY LIMITATIONS: carrying, lifting, bending, squatting, stairs, transfers, dressing, and locomotion level  PARTICIPATION LIMITATIONS: cleaning, laundry, shopping, community activity, occupation, and yard work  PERSONAL FACTORS: Age, Past/current experiences, Time since onset of injury/illness/exacerbation, and 1-2 comorbidities: hx of stroke, DDD, HTN  are also affecting patient's functional outcome.   REHAB POTENTIAL: Fair    CLINICAL DECISION MAKING: Evolving/moderate complexity  EVALUATION COMPLEXITY: Moderate   GOALS:  Goals reviewed with patient? Yes  SHORT TERM GOALS: Target date: 01/04/24  Patient will be independent with initial HEP. Baseline:  Goal status: Progressing 12/14/23  2.  Patient will demonstrate decreased fall risk by scoring < 35 sec on TUG. Baseline: 51s Goal status: INITIAL  3.  Patient will be educated on strategies to decrease risk of falls.  Baseline:  Goal status: INITIAL   LONG TERM GOALS: Target date: 02/15/24  Patient will be  independent with advanced/ongoing HEP to improve outcomes and carryover.  Baseline:  Goal status: INITIAL  2.  Patient will be able to ambulate 300' with LRAD with good safety to access community.  Baseline:  Goal status: INITIAL  3.  Patient will demonstrate decreased fall risk by scoring < 25 sec on TUG. Baseline: 51s Goal status: INITIAL  4.  Patient will demonstrate improved functional LE strength as demonstrated by 5xSTS <25s. Baseline: 43s with UE push off Goal status: INITIAL  5.  Patient will report no falls in a 4 week period.  Baseline: 5 this month Goal status: INITIAL  6.  Patient will score at least 30 or better on Berg Balance test to demonstrate lower risk of falls. Baseline: 23 Goal status: INITIAL  PLAN:  PT FREQUENCY: 2x/week  PT DURATION: 12 weeks  PLANNED INTERVENTIONS: 97110-Therapeutic exercises, 97530- Therapeutic activity, 97112- Neuromuscular re-education, 97535- Self Care, [id number]- Manual therapy, [id number]- Gait training, Patient/Family education, Balance training, Stair training, Taping, Joint mobilization, Spinal mobilization, Cryotherapy, and Moist heat.  PLAN FOR NEXT SESSION: LE strengthening, balance training- stay close as she is not very steady on her feet    Tanda KANDICE Sorrow, PTA 12/14/2023, 1:41 PM

## 2023-12-16 ENCOUNTER — Telehealth: Payer: Self-pay | Admitting: Pharmacist

## 2023-12-16 NOTE — Progress Notes (Signed)
 Pharmacy Quality Measure Review  This patient is appearing on a report for being at risk of failing the adherence measure for cholesterol (statin) medications this calendar year.   Medication: Atorvastatin  Last fill date: 07/24/23 for 90 day supply  Called patient to discuss atorvastatin  adherence. Pt reports she is taking atorvastatin  1 tablet daily but does need a refill. Contacted [facility] to facilitate atorvastatin  refill.  Darrelyn Drum, PharmD, BCPS, CPP Clinical Pharmacist Practitioner [facility] [phone]

## 2023-12-19 DIAGNOSIS — F4381 Prolonged grief disorder: Secondary | ICD-10-CM | POA: Diagnosis not present

## 2023-12-20 ENCOUNTER — Ambulatory Visit: Admitting: Physical Therapy

## 2023-12-22 ENCOUNTER — Ambulatory Visit: Admitting: Physical Therapy

## 2023-12-26 DIAGNOSIS — F4381 Prolonged grief disorder: Secondary | ICD-10-CM | POA: Diagnosis not present

## 2023-12-27 ENCOUNTER — Ambulatory Visit: Admitting: Physical Therapy

## 2023-12-28 ENCOUNTER — Ambulatory Visit: Admitting: Physical Therapy

## 2024-01-02 ENCOUNTER — Inpatient Hospital Stay
Admission: RE | Admit: 2024-01-02 | Discharge: 2024-01-02 | Disposition: A | Payer: Self-pay | Source: Ambulatory Visit | Attending: Neurosurgery | Admitting: Neurosurgery

## 2024-01-02 ENCOUNTER — Other Ambulatory Visit: Payer: Self-pay | Admitting: Family Medicine

## 2024-01-02 ENCOUNTER — Encounter: Payer: Self-pay | Admitting: Behavioral Health

## 2024-01-02 ENCOUNTER — Ambulatory Visit: Admitting: Behavioral Health

## 2024-01-02 DIAGNOSIS — Z049 Encounter for examination and observation for unspecified reason: Secondary | ICD-10-CM

## 2024-01-02 DIAGNOSIS — F4310 Post-traumatic stress disorder, unspecified: Secondary | ICD-10-CM

## 2024-01-02 DIAGNOSIS — F411 Generalized anxiety disorder: Secondary | ICD-10-CM | POA: Diagnosis not present

## 2024-01-02 DIAGNOSIS — F4381 Prolonged grief disorder: Secondary | ICD-10-CM | POA: Diagnosis not present

## 2024-01-02 DIAGNOSIS — F332 Major depressive disorder, recurrent severe without psychotic features: Secondary | ICD-10-CM

## 2024-01-02 NOTE — Progress Notes (Signed)
 [facility]  Patient ID: SYLVI RYBOLT,  MRN: [id number]  PCP: Anna Lyons LABOR, MD  Date of Evaluation: 01/02/2024 Time spent:0 minutes  Chief Complaint:   HISTORY/CURRENT STATUS: HPI Anna Lyons, 68 year old female presents to this office for follow up and medication management. Collateral information should be considered reliable.  She is calm and collected today but ambulating more slowly.  She is more alert and less distressed.  She feels like Vraylar  is really helping her with depression.  So far she is very happy with the medication but has concerns about being able to afford.  She is currently on Medicare and says that a refill was going to be over $400.  She is requesting help with patient assistance application.  She  reports  continued depression today at 3/10, and anxiety at 3/10.  She is sleeping well with the aid of temazepam  15 mg at bedtime.  Not as much daytime somnolence recently.  She denies history of mania, no psychosis, no auditory or visual hallucinations.  No delirium.  No current SI or HI.  Verbally contracts for safety and says that she would never harm self. Strong family support.   Past psychiatric medication trials:   Zoloft  Duloxetine  Temazepam  Aripiprazole  [name]      Individual Medical History/ Review of Systems: Changes? :No   Allergies: Codeine and Oxycodone -acetaminophen   Current Medications:  Current Outpatient Medications:    aspirin  EC 81 MG tablet, Take 81 mg by mouth daily., Disp: , Rfl:    atorvastatin  (LIPITOR ) 80 MG tablet, TAKE 1 TABLET BY MOUTH AT BEDTIME, Disp: 90 tablet, Rfl: 1   buPROPion  (WELLBUTRIN  XL) 300 MG 24 hr tablet, Take 1 tablet (300 mg total) by mouth daily., Disp: 30 tablet, Rfl: 2   cariprazine  (VRAYLAR ) 1.5 MG capsule, Take 1 capsule (1.5 mg total) by mouth daily., Disp: 30 capsule, Rfl: 1   cholecalciferol (VITAMIN D3) 25 MCG (1000 UNIT) tablet, Take 1,000 Units by mouth daily., Disp: , Rfl:    escitalopram   (LEXAPRO ) 20 MG tablet, Take 1 tablet (20 mg total) by mouth daily., Disp: 30 tablet, Rfl: 2   ferrous sulfate  324 MG TBEC, Take 324 mg by mouth., Disp: , Rfl:    gabapentin  (NEURONTIN ) 300 MG capsule, Take 1 capsule (300 mg total) by mouth 2 (two) times daily. (Patient taking differently: Take 300 mg by mouth daily.), Disp: 180 capsule, Rfl: 3   levETIRAcetam  (KEPPRA ) 500 MG tablet, Take 1 tablet (500 mg total) by mouth 2 (two) times daily., Disp: 180 tablet, Rfl: 3   Magnesium  250 MG TABS, Take by mouth., Disp: , Rfl:    Multiple Vitamin (MULTIVITAMIN WITH MINERALS) TABS tablet, Take 1 tablet by mouth daily., Disp: , Rfl:    pramipexole  (MIRAPEX ) 0.5 MG tablet, TAKE 1 TABLET BY MOUTH AT BEDTIME AS NEEDED, Disp: 90 tablet, Rfl: 0   temazepam  (RESTORIL ) 15 MG capsule, Take 1 capsule by mouth at bedtime, Disp: 90 capsule, Rfl: 0   vitamin B-12 (CYANOCOBALAMIN ) 1000 MCG tablet, Take 1,000 mcg by mouth daily., Disp: , Rfl:  Medication Side Effects: none  Family Medical/ Social History: Changes? No  MENTAL HEALTH EXAM:  There were no vitals taken for this visit.There is no height or weight on file to calculate BMI.  General Appearance: Casual, Neat, and Well Groomed  Eye Contact:  Good  Speech:  Clear and Coherent  Volume:  Normal  Mood:  Anxious, Depressed, and Dysphoric  Affect:  Congruent, Depressed, and Anxious  Thought Process:  Coherent  Orientation:  Full (Time, Place, and Person)  Thought Content: Logical   Suicidal Thoughts:  No  Homicidal Thoughts:  No  Memory:  WNL  Judgement:  Good  Insight:  Good  Psychomotor Activity:  Normal  Concentration:  Concentration: Good  Recall:  Good  Fund of Knowledge: Good  Language: Good  Assets:  Desire for Improvement  ADL's:  Intact  Cognition: WNL  Prognosis:  Good    DIAGNOSES:    ICD-10-CM   1. Severe episode of recurrent major depressive disorder, without psychotic features (HCC)  F33.2     2. Generalized anxiety disorder   F41.1     3. PTSD (post-traumatic stress disorder)  F43.10       Receiving Psychotherapy: No    RECOMMENDATIONS:   Greater than 50% of 30 min  face to face time with patient was spent on counseling and coordination of care.  Discussed her current level of stability today.   She is much brighter today and appears to be feeling much better.  She is smiling and says she believes Vraylar  is starting to work.  Daytime somnolence has been lessened, but still experiencing fatigue.  Recommended that she still follow-up with a dentist to soon as possible.  We reviewed her medications and discussed other medication options.  She is concerned about the cost of Vraylar  and says that she cannot afford 400+ dollars for a refill.  I agreed to provide samples today while she applies for patient assistance.  I reinforced to her that any of these medications could increase the risk of stroke.  She acknowledged understanding and said that she could not continue feeling as bad as she does. She believes benefit outweighs the risk.      We agreed today to:  AIMS Score=0   01/02/2024  Continue start Vraylar  1.5 mg daily in the a.m. after breakfast Continue Lexapro  20  mg daily after breakfast To continue Temazapam 15 mg at bedtime for sleep Continue Gabapentin  to 300 mg daily  Continue Wellbutrin  to 300 mg XR in the am after breakfast. To follow-up in 6  weeks to reassess Will report worsening symptoms or side effects promptly Provided emergency contact information number Discussed potential metabolic side effects associated with atypical antipsychotics, as well as potential risk for movement side effects. Advised pt to contact office if movement side effects occur.   Reviewed PDMP    [name], NP

## 2024-01-02 NOTE — Progress Notes (Deleted)
 Referring Physician:  Rollene Almarie LABOR, MD [address] [place],  [place] [address]  Primary Physician:  Anna Almarie LABOR, MD  History of Present Illness: 01/02/2024 Anna Lyons is here today with a chief complaint of *** Back pain and frequent falls with right leg weakness related to previous stroke  Clemens in the bathroom on 12/20/2023, T12 compression fracture When did it start: 2013 What happened:Stroke   Conservative measures:  Physical therapy: *** has participated in Pt at [facility] for weakness, frequent falls Multimodal medical therapy including regular antiinflammatories: *** Gabapentin  Injections: no epidural steroid injections  Past Surgery: ***none  Anna Lyons has ***no symptoms of cervical myelopathy.  The symptoms are causing a significant impact on the patient's life.   I have utilized the care everywhere function in epic to review the outside records available from external health systems.  Review of Systems:  A 10 point review of systems is negative, except for the pertinent positives and negatives detailed in the HPI.  Past Medical History: Past Medical History:  Diagnosis Date   Angina    Breast cyst    Cancer (HCC)    Depression    Hypertension    RLS (restless legs syndrome) 11/16/2011   Seizures (HCC) 2016   last seizure=last year, 2016 per pt.   Stroke (HCC) 04/20/2011   a. 04/20/11 Left subcortical infarct treated w/ TPA;  b. 04/2011 Echo: EF 55-60%;  c. 04/2011 Normal Carotid u/s  d. Residual walk w/limp on right; unable to grasp w/right hand   TIA (transient ischemic attack) 08/2010   Tobacco abuse    a. quit @ time of CVA 04/2011.    Past Surgical History: Past Surgical History:  Procedure Laterality Date   BREAST RECONSTRUCTION     bilaterally   CARPAL TUNNEL RELEASE Left 1990's   CARPAL TUNNEL RELEASE Left 12/08/2015   CESAREAN SECTION  1979   KNEE ARTHROSCOPY Left 1990's    cartilage repair   LEFT HEART  CATHETERIZATION WITH CORONARY ANGIOGRAM N/A 05/20/2011   Procedure: LEFT HEART CATHETERIZATION WITH CORONARY ANGIOGRAM;  Surgeon: Anna JAYSON Emmer, MD;  Location: [facility] CATH LAB;  Service: Cardiovascular;  Laterality: N/A;   MASTECTOMY Bilateral 1980's   bilateral with reconstruction, breast cancer on left    Allergies: Allergies as of 01/11/2024 - Review Complete 01/02/2024  Allergen Reaction Noted   Codeine Itching 04/20/2011   Oxycodone -acetaminophen  Hives 03/30/2019    Medications:  Current Outpatient Medications:    aspirin  EC 81 MG tablet, Take 81 mg by mouth daily., Disp: , Rfl:    atorvastatin  (LIPITOR ) 80 MG tablet, TAKE 1 TABLET BY MOUTH AT BEDTIME, Disp: 90 tablet, Rfl: 1   buPROPion  (WELLBUTRIN  XL) 300 MG 24 hr tablet, Take 1 tablet (300 mg total) by mouth daily., Disp: 30 tablet, Rfl: 2   cariprazine  (VRAYLAR ) 1.5 MG capsule, Take 1 capsule (1.5 mg total) by mouth daily., Disp: 30 capsule, Rfl: 1   cholecalciferol (VITAMIN D3) 25 MCG (1000 UNIT) tablet, Take 1,000 Units by mouth daily., Disp: , Rfl:    escitalopram  (LEXAPRO ) 20 MG tablet, Take 1 tablet (20 mg total) by mouth daily., Disp: 30 tablet, Rfl: 2   ferrous sulfate  324 MG TBEC, Take 324 mg by mouth., Disp: , Rfl:    gabapentin  (NEURONTIN ) 300 MG capsule, Take 1 capsule (300 mg total) by mouth 2 (two) times daily. (Patient taking differently: Take 300 mg by mouth daily.), Disp: 180 capsule, Rfl: 3   levETIRAcetam  (  KEPPRA ) 500 MG tablet, Take 1 tablet (500 mg total) by mouth 2 (two) times daily., Disp: 180 tablet, Rfl: 3   Magnesium  250 MG TABS, Take by mouth., Disp: , Rfl:    Multiple Vitamin (MULTIVITAMIN WITH MINERALS) TABS tablet, Take 1 tablet by mouth daily., Disp: , Rfl:    pramipexole  (MIRAPEX ) 0.5 MG tablet, TAKE 1 TABLET BY MOUTH AT BEDTIME AS NEEDED, Disp: 90 tablet, Rfl: 0   temazepam  (RESTORIL ) 15 MG capsule, Take 1 capsule by mouth at bedtime, Disp: 90 capsule, Rfl: 0   vitamin B-12 (CYANOCOBALAMIN ) 1000 MCG  tablet, Take 1,000 mcg by mouth daily., Disp: , Rfl:   Social History: Social History   Tobacco Use   Smoking status: Former    Current packs/day: 0.75    Average packs/day: 0.8 packs/day for 50.9 years (38.2 ttl pk-yrs)    Types: Cigarettes    Start date: 02/01/1973    Passive exposure: Past   Smokeless tobacco: Never  Vaping Use   Vaping status: Never Used  Substance Use Topics   Alcohol use: Yes    Alcohol/week: 1.0 standard drink of alcohol    Types: 1 Glasses of wine per week    Comment: 1 glass at night 2x a week   Drug use: No    Family Medical History: Family History  Problem Relation Age of Onset   Depression Mother    Anxiety disorder Mother    Lupus Mother        died early 62's.   Alcohol abuse Father    Emphysema Father        died late 60's.   COPD Father    Sexual abuse Brother    Alcohol abuse Brother    Suicidality Brother    Pancreatic cancer Brother    Severe combined immunodeficiency Brother    Alcohol abuse Brother    Bladder Cancer Brother    Alcohol abuse Brother    Rectal cancer Brother    Alcohol abuse Brother    Suicidality Brother    Alcohol abuse Brother    Colon cancer Neg Hx     Physical Examination: There were no vitals filed for this visit.  General: Patient is in no apparent distress. Attention to examination is appropriate.  Neck:   Supple.  Full range of motion.  Respiratory: Patient is breathing without any difficulty.   NEUROLOGICAL:     Awake, alert, oriented to person, place, and time.  Speech is clear and fluent.   Cranial Nerves: Pupils equal round and reactive to light.  Facial tone is symmetric.  Facial sensation is symmetric. Shoulder shrug is symmetric. Tongue protrusion is midline.    Strength: Side Biceps Triceps Deltoid Interossei Grip Wrist Ext. Wrist Flex.  R 5 5 5 5 5 5 5   L 5 5 5 5 5 5 5    Side Iliopsoas Quads Hamstring PF DF EHL  R 5 5 5 5 5 5   L 5 5 5 5 5 5    Reflexes are ***2+ and symmetric  at the biceps, triceps, brachioradialis, patella and achilles.   Hoffman's is absent. Clonus is absent  Bilateral upper and lower extremity sensation is intact to light touch ***.     No evidence of dysmetria noted.  Gait is normal.    Imaging: *** I have personally reviewed the images and agree with the above interpretation.  Medical Decision Making/Assessment and Plan: Ms. Leverett is a pleasant 68 y.o. female with ***  There are no  diagnoses linked to this encounter.   Thank you for involving me in the care of this patient.    Penne MICAEL Sharps MD/MSCR Neurosurgery

## 2024-01-03 ENCOUNTER — Ambulatory Visit: Attending: Internal Medicine | Admitting: Physical Therapy

## 2024-01-05 ENCOUNTER — Ambulatory Visit

## 2024-01-06 ENCOUNTER — Other Ambulatory Visit: Payer: Self-pay | Admitting: Internal Medicine

## 2024-01-09 ENCOUNTER — Ambulatory Visit: Admitting: Behavioral Health

## 2024-01-11 ENCOUNTER — Ambulatory Visit: Admitting: Neurosurgery

## 2024-01-23 ENCOUNTER — Other Ambulatory Visit: Payer: Self-pay | Admitting: Behavioral Health

## 2024-01-23 DIAGNOSIS — F332 Major depressive disorder, recurrent severe without psychotic features: Secondary | ICD-10-CM

## 2024-01-23 DIAGNOSIS — F411 Generalized anxiety disorder: Secondary | ICD-10-CM

## 2024-01-30 ENCOUNTER — Other Ambulatory Visit: Payer: Self-pay

## 2024-02-03 ENCOUNTER — Telehealth: Payer: Self-pay | Admitting: Behavioral Health

## 2024-02-03 NOTE — Telephone Encounter (Signed)
 Pt called at 9:49a requesting more samples of Vraylar .  Pls call pt's daughter when it is ready to be picked up.  Dawn Avolio Daughter [phone]    Next appt 1/13

## 2024-02-03 NOTE — Telephone Encounter (Signed)
 Is there a reason she doesn't have access to a prescription? Need a PA? Cost?

## 2024-02-03 NOTE — Telephone Encounter (Addendum)
 I just called the pharmacy. Does not need PA, copay is $800+. Pulled some samples to give time to figure out next step. Has an appt 1/13.   Called dtr to let her know samples were ready. She has done paperwork for patient assistance and will bring in today when she gets samples.

## 2024-02-06 ENCOUNTER — Other Ambulatory Visit: Payer: Self-pay | Admitting: Behavioral Health

## 2024-02-06 ENCOUNTER — Ambulatory Visit: Admitting: Neurosurgery

## 2024-02-06 ENCOUNTER — Other Ambulatory Visit: Payer: Self-pay | Admitting: Internal Medicine

## 2024-02-06 DIAGNOSIS — F332 Major depressive disorder, recurrent severe without psychotic features: Secondary | ICD-10-CM

## 2024-02-06 DIAGNOSIS — F411 Generalized anxiety disorder: Secondary | ICD-10-CM

## 2024-02-08 NOTE — Telephone Encounter (Signed)
 Copied from CRM #[id number]. Topic: Clinical - Medication Refill >> Feb 08, 2024 10:09 AM Nessti S wrote: Medication: temazepam  (RESTORIL ) 15 MG capsule   Has the patient contacted their pharmacy? Yes (Agent: If no, request that the patient contact the pharmacy for the refill. If patient does not wish to contact the pharmacy document the reason why and proceed with request.) (Agent: If yes, when and what did the pharmacy advise?)  This is the patient's preferred pharmacy:  [facility] [address], [place] - [address] [place] [address] Phone: [phone] Fax: [phone]  Is this the correct pharmacy for this prescription? Yes If no, delete pharmacy and type the correct one.   Has the prescription been filled recently? No  Is the patient out of the medication? Yes  Has the patient been seen for an appointment in the last year OR does the patient have an upcoming appointment? Yes  Can we respond through MyChart? Yes  Agent: Please be advised that Rx refills may take up to 3 business days. We ask that you follow-up with your pharmacy.

## 2024-02-09 ENCOUNTER — Encounter: Payer: Self-pay | Admitting: Internal Medicine

## 2024-02-09 NOTE — Telephone Encounter (Signed)
 Medication: Restoril  Directions:Take 1 capsule by mouth at bedtime, Starting Tue 11/08/2023, Normal Dose, Route, Frequency:15 mg, Oral, Daily a  Last given: 11/08/2023 Number refills: 0 Last o/v:  Follow up:  Labs:    Please review refill request

## 2024-02-10 ENCOUNTER — Other Ambulatory Visit: Payer: Self-pay | Admitting: Behavioral Health

## 2024-02-10 NOTE — Telephone Encounter (Signed)
 Could please see what the problem with his with this med. Received this message from Dr. Rollene but I did not prescribe this last time.

## 2024-02-13 ENCOUNTER — Ambulatory Visit: Admitting: Internal Medicine

## 2024-02-14 ENCOUNTER — Telehealth: Payer: Self-pay | Admitting: Behavioral Health

## 2024-02-14 ENCOUNTER — Ambulatory Visit: Admitting: Behavioral Health

## 2024-02-14 NOTE — Telephone Encounter (Signed)
 Next visit is 03/01/24. Anna Lyons is requesting something cheaper than Vraylar . The Vraylar  at [facility] is $656.00 a month and its very expensive. She is asking for something not as expensive. Pharmacy is  [facility] [id number] [place], [place] - [address]   Phone: [phone]  Fax: [phone]

## 2024-02-16 NOTE — Telephone Encounter (Signed)
 Have pt come in to get samples of Vraylar  before her next apt if she is almost out. We will get her information for pt. assistance

## 2024-02-16 NOTE — Telephone Encounter (Signed)
 Yes, please offer samples and explain what she needs to do for patient assistance. It took me a while to get her stable and was doing much better on Vraylar .

## 2024-02-16 NOTE — Telephone Encounter (Signed)
 Contacted pt's [facility] pharmacy and they quoted her co-pay is $894.00 foVraylar  1.5 mg due to her deductible. Pt is Medicare so unable to use a savings card. Only option would be pt. Assistance if she qualifies. Next apt 1/29.   Can have her get samples until then or ? Not sure how long pt has been taking.

## 2024-02-17 NOTE — Telephone Encounter (Signed)
Pt aware to come get samples.  

## 2024-02-20 ENCOUNTER — Ambulatory Visit: Admitting: Internal Medicine

## 2024-02-23 ENCOUNTER — Telehealth: Payer: Self-pay | Admitting: Behavioral Health

## 2024-02-23 NOTE — Telephone Encounter (Signed)
 Pt is approved for [employer] for patient assistance for VRAYLAR . Shipments will go directly to patient's address. Approved thru 01/31/2025.

## 2024-03-01 ENCOUNTER — Ambulatory Visit: Admitting: Behavioral Health

## 2024-03-05 ENCOUNTER — Telehealth: Payer: Self-pay

## 2024-03-05 NOTE — Telephone Encounter (Unsigned)
 Copied from CRM [id number]. Topic: Clinical - Prescription Issue >> Mar 05, 2024 12:26 PM Rea BROCKS wrote: Reason for CRM:  temazepam  (RESTORIL ) 15 MG capsule  [facility] [employer]  [facility]   They sent a request for prescription and did not hear anything back from clinic. It looks like there was a delay due to ordering provider and it was not addressed with pharmacy.   [phone] (call back #)

## 2024-03-07 ENCOUNTER — Ambulatory Visit: Admitting: Radiology

## 2024-03-07 ENCOUNTER — Ambulatory Visit: Payer: Self-pay | Admitting: Nurse Practitioner

## 2024-03-07 ENCOUNTER — Ambulatory Visit
Admission: EM | Admit: 2024-03-07 | Discharge: 2024-03-07 | Disposition: A | Discharge status: Home / Self Care | Source: Home / Self Care

## 2024-03-07 ENCOUNTER — Ambulatory Visit

## 2024-03-07 ENCOUNTER — Telehealth: Payer: Self-pay | Admitting: Neurosurgery

## 2024-03-07 ENCOUNTER — Encounter: Payer: Self-pay | Admitting: Emergency Medicine

## 2024-03-07 DIAGNOSIS — S63502A Unspecified sprain of left wrist, initial encounter: Secondary | ICD-10-CM

## 2024-03-07 NOTE — Telephone Encounter (Signed)
 LVM for pt to call back and let us  know if she has started/completed PT for referral.  If PT has been completed please send request for initial and discharge reports.   If PT has not been completed schedule with PA.

## 2024-03-07 NOTE — ED Triage Notes (Addendum)
 Pt fell on ice yesterday. Presents today with left wrist pain. Denies hitting head.  Pt has been using ice and ace wrap at home

## 2024-03-07 NOTE — ED Provider Notes (Signed)
 [facility]    CSN: [id number] Arrival date & time: 03/07/24  1112      History   Chief Complaint Chief Complaint  Patient presents with   Wrist Pain   Fall    HPI Anna Lyons is a 69 y.o. female presents with family for evaluation of wrist pain after fall.  Patient states yesterday she slipped on some ice falling onto an outstretched left wrist.  No head injury or LOC.  Reports since then she has been having pain and swelling to the left wrist.  Intermittent tingling but no numbness.  Has a history of carpal tunnel surgery to the wrist in the past.  Has been doing an Ace wrap and Aleve  for symptoms.  No other concerns   Wrist Pain  Fall    Past Medical History:  Diagnosis Date   Angina    Breast cyst    Cancer (HCC)    Depression    Hypertension    RLS (restless legs syndrome) 11/16/2011   Seizures (HCC) 2016   last seizure=last year, 2016 per pt.   Stroke (HCC) 04/20/2011   a. 04/20/11 Left subcortical infarct treated w/ TPA;  b. 04/2011 Echo: EF 55-60%;  c. 04/2011 Normal Carotid u/s  d. Residual walk w/limp on right; unable to grasp w/right hand   TIA (transient ischemic attack) 08/2010   Tobacco abuse    a. quit @ time of CVA 04/2011.    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   Claudication 02/28/2019   PTSD (post-traumatic stress disorder) 04/01/2017   Cough 03/04/2016   Routine general medical examination at a health care facility 11/15/2014   Hemiparesis affecting right side as late effect of cerebrovascular accident (HCC) 06/18/2013   Seizure disorder (HCC) 05/19/2012   Hypokalemia 11/16/2011   RLS (restless legs syndrome) 11/16/2011   Essential hypertension,  benign 05/19/2011   Tobacco abuse 05/19/2011   Moderate episode of recurrent major depressive disorder [facility])     Past Surgical History:  Procedure Laterality Date   BREAST RECONSTRUCTION     bilaterally   CARPAL TUNNEL RELEASE Left 1990's   CARPAL TUNNEL RELEASE Left 12/08/2015   CESAREAN SECTION  1979   KNEE ARTHROSCOPY Left 1990's    cartilage repair   LEFT HEART CATHETERIZATION WITH CORONARY ANGIOGRAM N/A 05/20/2011   Procedure: LEFT HEART CATHETERIZATION WITH CORONARY ANGIOGRAM;  Surgeon: Maude JAYSON Emmer, MD;  Location: [facility] CATH LAB;  Service: Cardiovascular;  Laterality: N/A;   MASTECTOMY Bilateral 1980's   bilateral with reconstruction, breast cancer on left    OB History   No obstetric history on file.      Home Medications    Prior to Admission medications  Medication Sig Start Date End Date Taking? Authorizing Provider  aspirin  EC 81 MG tablet Take 81 mg by mouth daily.    [provider]  atorvastatin  (LIPITOR ) 80 MG tablet TAKE 1 TABLET BY MOUTH AT BEDTIME 10/24/23   Rollene Almarie LABOR, MD  buPROPion  (WELLBUTRIN  XL) 300 MG 24 hr tablet Take 1 tablet by mouth once daily 01/25/24   Teresa Rogue A, NP  cariprazine  (VRAYLAR ) 1.5 MG capsule Take 1 capsule (1.5 mg total) by mouth daily. 12/12/23  Teresa Rogue A, NP  cholecalciferol (VITAMIN D3) 25 MCG (1000 UNIT) tablet Take 1,000 Units by mouth daily.    [provider]  escitalopram  (LEXAPRO ) 20 MG tablet Take 1 tablet by mouth once daily 02/08/24   Teresa Rogue A, NP  ferrous sulfate  324 MG TBEC Take 324 mg by mouth.    [provider]  gabapentin  (NEURONTIN ) 300 MG capsule Take 1 capsule (300 mg total) by mouth 2 (two) times daily. Patient taking differently: Take 300 mg by mouth daily. 05/10/23   Rollene Almarie LABOR, MD  levETIRAcetam  (KEPPRA ) 500 MG tablet Take 1 tablet (500 mg total) by mouth 2 (two) times daily. 05/10/23   Rollene Almarie LABOR, MD  Magnesium  250 MG TABS Take by mouth.     [provider]  Multiple Vitamin (MULTIVITAMIN WITH MINERALS) TABS tablet Take 1 tablet by mouth daily.    [provider]  pramipexole  (MIRAPEX ) 0.5 MG tablet TAKE 1 TABLET BY MOUTH AT BEDTIME AS NEEDED 01/06/24   Rollene Almarie LABOR, MD  temazepam  (RESTORIL ) 15 MG capsule Take 1 capsule by mouth at bedtime 02/10/24   Rollene Almarie LABOR, MD  vitamin B-12 (CYANOCOBALAMIN ) 1000 MCG tablet Take 1,000 mcg by mouth daily.    [provider]    Family History Family History  Problem Relation Age of Onset   Depression Mother    Anxiety disorder Mother    Lupus Mother        died early 62's.   Alcohol abuse Father    Emphysema Father        died late 19's.   COPD Father    Sexual abuse Brother    Alcohol abuse Brother    Suicidality Brother    Pancreatic cancer Brother    Severe combined immunodeficiency Brother    Alcohol abuse Brother    Bladder Cancer Brother    Alcohol abuse Brother    Rectal cancer Brother    Alcohol abuse Brother    Suicidality Brother    Alcohol abuse Brother    Colon cancer Neg Hx     Social History Social History[1]   Allergies   Codeine and Oxycodone -acetaminophen    Review of Systems Review of Systems  Musculoskeletal:        Left wrist pain after fall     Physical Exam Triage Vital Signs ED Triage Vitals  Encounter Vitals Group     BP 03/07/24 1123 111/72     Girls Systolic BP Percentile --      Girls Diastolic BP Percentile --      Boys Systolic BP Percentile --      Boys Diastolic BP Percentile --      Pulse Rate 03/07/24 1123 66     Resp 03/07/24 1123 17     Temp 03/07/24 1123 97.7 F (36.5 C)     Temp Source 03/07/24 1123 Oral     SpO2 03/07/24 1123 92 %     Weight --      Height --      Head Circumference --      Peak Flow --      Pain Score 03/07/24 1127 10     Pain Loc --      Pain Education --      Exclude from Growth Chart --    No data found.  Updated Vital Signs BP 111/72 (BP  Location: Right Arm)   Pulse 66   Temp 97.7 F (36.5 C) (Oral)  Resp 17   SpO2 92%   Visual Acuity Right Eye Distance:   Left Eye Distance:   Bilateral Distance:    Right Eye Near:   Left Eye Near:    Bilateral Near:     Physical Exam Vitals and nursing note reviewed.  Constitutional:      General: She is not in acute distress.    Appearance: Normal appearance. She is not ill-appearing.  HENT:     Head: Normocephalic and atraumatic.  Eyes:     Pupils: Pupils are equal, round, and reactive to light.  Cardiovascular:     Rate and Rhythm: Normal rate.  Pulmonary:     Effort: Pulmonary effort is normal.  Musculoskeletal:     Left wrist: Swelling, tenderness, bony tenderness and snuff box tenderness present. No deformity, effusion, lacerations or crepitus. Decreased range of motion. Normal pulse.  Skin:    General: Skin is warm and dry.  Neurological:     General: No focal deficit present.     Mental Status: She is alert and oriented to person, place, and time.  Psychiatric:        Mood and Affect: Mood normal.        Behavior: Behavior normal.      UC Treatments / Results  Labs (all labs ordered are listed, but only abnormal results are displayed) Labs Reviewed - No data to display  EKG   Radiology DG Wrist Complete Left Result Date: 03/07/2024 CLINICAL DATA:  Left wrist pain after fall EXAM: LEFT WRIST - COMPLETE 3+ VIEW COMPARISON:  None Available. FINDINGS: There is no evidence of fracture or dislocation. There is no evidence of arthropathy or other focal bone abnormality. Soft tissues are unremarkable. IMPRESSION: Negative. Electronically Signed   By: Lynwood Landy Raddle M.D.   On: 03/07/2024 11:42    Procedures Procedures (including critical care time)  Medications Ordered in UC Medications - No data to display  Initial Impression / Assessment and Plan / UC Course  I have reviewed the triage vital signs and the nursing notes.  Pertinent labs & imaging  results that were available during my care of the patient were reviewed by me and considered in my medical decision making (see chart for details).     Reviewed exam and symptoms with patient and family.  X-rays negative for fracture but patient still has snuffbox tenderness, will place in thumb spica and discussed RICE therapy.  Will follow-up with her PCP 1 week for repeat x-ray.  She may use over-the-counter analgesics as needed.  ER precautions reviewed. Final Clinical Impressions(s) / UC Diagnoses   Final diagnoses:  Sprain of left wrist, unspecified location, initial encounter     Discharge Instructions      Your x-ray was negative for fracture.  Given the nature of your injury and the tenderness that you still have please use the wrist brace to help support and stabilize the wrist.  You may still elevate and ice as needed and take over-the-counter Tylenol  or ibuprofen  if needed.  Follow-up with your PCP in 1 week for repeat x-ray if your symptoms are not improving.  If they are worsening please seek reevaluation sooner or go to the emergency room.  Hope you feel better soon!    ED Prescriptions   None    PDMP not reviewed this encounter.    [1]  Social History Tobacco Use   Smoking status: Former    Current packs/day: 0.75    Average packs/day: 0.8 packs/day  for 51.1 years (38.3 ttl pk-yrs)    Types: Cigarettes    Start date: 02/01/1973    Passive exposure: Past   Smokeless tobacco: Never  Vaping Use   Vaping status: Never Used  Substance Use Topics   Alcohol use: Yes    Alcohol/week: 1.0 standard drink of alcohol    Types: 1 Glasses of wine per week    Comment: 1 glass at night 2x a week   Drug use: No     Loreda Myla SAUNDERS, NP 03/07/24 1158  "

## 2024-03-07 NOTE — Telephone Encounter (Signed)
 Initial request received from pt on 1/8 for refill on medication. Pt asked it to be sent to [facility]. Request was sent on 1/9 by Dr. Rollene.

## 2024-03-07 NOTE — Discharge Instructions (Addendum)
 Your x-ray was negative for fracture.  Given the nature of your injury and the tenderness that you still have please use the wrist brace to help support and stabilize the wrist.  You may still elevate and ice as needed and take over-the-counter Tylenol  or ibuprofen  if needed.  Follow-up with your PCP in 1 week for repeat x-ray if your symptoms are not improving.  If they are worsening please seek reevaluation sooner or go to the emergency room.  Hope you feel better soon!

## 2024-03-09 ENCOUNTER — Other Ambulatory Visit: Payer: Self-pay | Admitting: Internal Medicine

## 2024-03-26 ENCOUNTER — Ambulatory Visit: Admitting: Behavioral Health

## 2024-05-14 ENCOUNTER — Ambulatory Visit
